# Patient Record
Sex: Female | Born: 1935 | Race: White | Hispanic: No | Smoking: Never smoker
Health system: Southern US, Community
[De-identification: ages and names within clinical notes are randomized; demographics above are authoritative.]

## PROBLEM LIST (undated history)

## (undated) DIAGNOSIS — I2510 Atherosclerotic heart disease of native coronary artery without angina pectoris: Secondary | ICD-10-CM

## (undated) DIAGNOSIS — Z9889 Other specified postprocedural states: Secondary | ICD-10-CM

## (undated) DIAGNOSIS — F329 Major depressive disorder, single episode, unspecified: Secondary | ICD-10-CM

## (undated) DIAGNOSIS — F419 Anxiety disorder, unspecified: Secondary | ICD-10-CM

## (undated) DIAGNOSIS — J45909 Unspecified asthma, uncomplicated: Secondary | ICD-10-CM

## (undated) DIAGNOSIS — K5790 Diverticulosis of intestine, part unspecified, without perforation or abscess without bleeding: Secondary | ICD-10-CM

## (undated) DIAGNOSIS — I82402 Acute embolism and thrombosis of unspecified deep veins of left lower extremity: Secondary | ICD-10-CM

## (undated) DIAGNOSIS — D126 Benign neoplasm of colon, unspecified: Secondary | ICD-10-CM

## (undated) DIAGNOSIS — M810 Age-related osteoporosis without current pathological fracture: Secondary | ICD-10-CM

## (undated) DIAGNOSIS — R55 Syncope and collapse: Secondary | ICD-10-CM

## (undated) DIAGNOSIS — I10 Essential (primary) hypertension: Secondary | ICD-10-CM

## (undated) DIAGNOSIS — M545 Low back pain, unspecified: Secondary | ICD-10-CM

## (undated) DIAGNOSIS — S72009A Fracture of unspecified part of neck of unspecified femur, initial encounter for closed fracture: Secondary | ICD-10-CM

## (undated) DIAGNOSIS — K219 Gastro-esophageal reflux disease without esophagitis: Secondary | ICD-10-CM

## (undated) DIAGNOSIS — M1990 Unspecified osteoarthritis, unspecified site: Secondary | ICD-10-CM

## (undated) DIAGNOSIS — R635 Abnormal weight gain: Secondary | ICD-10-CM

## (undated) DIAGNOSIS — L853 Xerosis cutis: Secondary | ICD-10-CM

## (undated) DIAGNOSIS — K224 Dyskinesia of esophagus: Secondary | ICD-10-CM

## (undated) DIAGNOSIS — R040 Epistaxis: Secondary | ICD-10-CM

## (undated) DIAGNOSIS — J449 Chronic obstructive pulmonary disease, unspecified: Secondary | ICD-10-CM

## (undated) DIAGNOSIS — E7800 Pure hypercholesterolemia, unspecified: Secondary | ICD-10-CM

## (undated) DIAGNOSIS — H3530 Unspecified macular degeneration: Secondary | ICD-10-CM

## (undated) DIAGNOSIS — F0390 Unspecified dementia without behavioral disturbance: Secondary | ICD-10-CM

## (undated) DIAGNOSIS — I80299 Phlebitis and thrombophlebitis of other deep vessels of unspecified lower extremity: Secondary | ICD-10-CM

## (undated) DIAGNOSIS — K222 Esophageal obstruction: Secondary | ICD-10-CM

## (undated) DIAGNOSIS — F32A Depression, unspecified: Secondary | ICD-10-CM

## (undated) DIAGNOSIS — K220 Achalasia of cardia: Secondary | ICD-10-CM

## (undated) DIAGNOSIS — W19XXXA Unspecified fall, initial encounter: Secondary | ICD-10-CM

## (undated) DIAGNOSIS — Z8679 Personal history of other diseases of the circulatory system: Secondary | ICD-10-CM

## (undated) DIAGNOSIS — R1310 Dysphagia, unspecified: Secondary | ICD-10-CM

## (undated) DIAGNOSIS — D473 Essential (hemorrhagic) thrombocythemia: Secondary | ICD-10-CM

## (undated) DIAGNOSIS — R600 Localized edema: Secondary | ICD-10-CM

## (undated) HISTORY — DX: Major depressive disorder, single episode, unspecified: F32.9

## (undated) HISTORY — PX: ABDOMINAL HYSTERECTOMY: SHX81

## (undated) HISTORY — PX: CHOLECYSTECTOMY: SHX55

## (undated) HISTORY — DX: Unspecified dementia without behavioral disturbance: F03.90

## (undated) HISTORY — DX: Epistaxis: R04.0

## (undated) HISTORY — DX: Personal history of other diseases of the circulatory system: Z86.79

## (undated) HISTORY — DX: Pure hypercholesterolemia, unspecified: E78.00

## (undated) HISTORY — PX: APPENDECTOMY: SHX54

## (undated) HISTORY — DX: Unspecified asthma, uncomplicated: J45.909

## (undated) HISTORY — DX: Syncope and collapse: R55

## (undated) HISTORY — DX: Unspecified macular degeneration: H35.30

## (undated) HISTORY — PX: CATARACT EXTRACTION W/ INTRAOCULAR LENS  IMPLANT, BILATERAL: SHX1307

## (undated) HISTORY — DX: Low back pain, unspecified: M54.50

## (undated) HISTORY — DX: Anxiety disorder, unspecified: F41.9

## (undated) HISTORY — DX: Unspecified osteoarthritis, unspecified site: M19.90

## (undated) HISTORY — DX: Benign neoplasm of colon, unspecified: D12.6

## (undated) HISTORY — DX: Other specified postprocedural states: Z98.890

## (undated) HISTORY — DX: Chronic obstructive pulmonary disease, unspecified: J44.9

## (undated) HISTORY — DX: Localized edema: R60.0

## (undated) HISTORY — DX: Fracture of unspecified part of neck of unspecified femur, initial encounter for closed fracture: S72.009A

## (undated) HISTORY — DX: Achalasia of cardia: K22.0

## (undated) HISTORY — DX: Essential (hemorrhagic) thrombocythemia: D47.3

## (undated) HISTORY — DX: Diverticulosis of intestine, part unspecified, without perforation or abscess without bleeding: K57.90

## (undated) HISTORY — DX: Gastro-esophageal reflux disease without esophagitis: K21.9

## (undated) HISTORY — DX: Abnormal weight gain: R63.5

## (undated) HISTORY — DX: Dysphagia, unspecified: R13.10

## (undated) HISTORY — DX: Xerosis cutis: L85.3

## (undated) HISTORY — DX: Low back pain: M54.5

## (undated) HISTORY — DX: Acute embolism and thrombosis of unspecified deep veins of left lower extremity: I82.402

## (undated) HISTORY — DX: Unspecified fall, initial encounter: W19.XXXA

## (undated) HISTORY — DX: Age-related osteoporosis without current pathological fracture: M81.0

## (undated) HISTORY — DX: Dyskinesia of esophagus: K22.4

## (undated) HISTORY — DX: Depression, unspecified: F32.A

## (undated) HISTORY — DX: Essential (primary) hypertension: I10

## (undated) HISTORY — DX: Atherosclerotic heart disease of native coronary artery without angina pectoris: I25.10

## (undated) HISTORY — DX: Phlebitis and thrombophlebitis of other deep vessels of unspecified lower extremity: I80.299

## (undated) SURGERY — EGD (ESOPHAGOGASTRODUODENOSCOPY)
Anesthesia: Moderate Sedation

---

## 1995-08-21 HISTORY — PX: OTHER SURGICAL HISTORY: SHX169

## 1997-11-16 ENCOUNTER — Inpatient Hospital Stay (HOSPITAL_COMMUNITY): Admission: EM | Admit: 1997-11-16 | Discharge: 1997-11-20 | Payer: Self-pay

## 1999-06-09 ENCOUNTER — Other Ambulatory Visit: Admission: RE | Admit: 1999-06-09 | Discharge: 1999-06-09 | Payer: Self-pay | Admitting: Obstetrics and Gynecology

## 2000-06-26 ENCOUNTER — Ambulatory Visit (HOSPITAL_COMMUNITY): Admission: RE | Admit: 2000-06-26 | Discharge: 2000-06-26 | Payer: Self-pay | Admitting: Internal Medicine

## 2000-06-26 ENCOUNTER — Encounter (INDEPENDENT_AMBULATORY_CARE_PROVIDER_SITE_OTHER): Payer: Self-pay

## 2001-05-09 ENCOUNTER — Encounter: Payer: Self-pay | Admitting: Emergency Medicine

## 2001-05-09 ENCOUNTER — Emergency Department (HOSPITAL_COMMUNITY): Admission: EM | Admit: 2001-05-09 | Discharge: 2001-05-09 | Payer: Self-pay | Admitting: Emergency Medicine

## 2001-09-20 ENCOUNTER — Encounter: Payer: Self-pay | Admitting: Pulmonary Disease

## 2001-09-20 ENCOUNTER — Encounter: Admission: RE | Admit: 2001-09-20 | Discharge: 2001-09-20 | Payer: Self-pay | Admitting: Pulmonary Disease

## 2002-07-23 ENCOUNTER — Encounter: Payer: Self-pay | Admitting: Pulmonary Disease

## 2002-07-23 ENCOUNTER — Ambulatory Visit (HOSPITAL_COMMUNITY): Admission: RE | Admit: 2002-07-23 | Discharge: 2002-07-23 | Payer: Self-pay | Admitting: Pulmonary Disease

## 2003-11-16 ENCOUNTER — Encounter: Admission: RE | Admit: 2003-11-16 | Discharge: 2003-11-16 | Payer: Self-pay | Admitting: Obstetrics and Gynecology

## 2003-12-01 ENCOUNTER — Ambulatory Visit (HOSPITAL_COMMUNITY): Admission: RE | Admit: 2003-12-01 | Discharge: 2003-12-01 | Payer: Self-pay | Admitting: Pulmonary Disease

## 2004-03-03 ENCOUNTER — Ambulatory Visit: Payer: Self-pay | Admitting: Pulmonary Disease

## 2004-03-03 ENCOUNTER — Ambulatory Visit (HOSPITAL_COMMUNITY): Admission: RE | Admit: 2004-03-03 | Discharge: 2004-03-03 | Payer: Self-pay | Admitting: Pulmonary Disease

## 2004-03-08 ENCOUNTER — Ambulatory Visit: Payer: Self-pay | Admitting: Pulmonary Disease

## 2004-05-27 ENCOUNTER — Ambulatory Visit: Payer: Self-pay | Admitting: Pulmonary Disease

## 2004-09-08 ENCOUNTER — Ambulatory Visit: Payer: Self-pay | Admitting: Pulmonary Disease

## 2004-09-22 ENCOUNTER — Ambulatory Visit: Payer: Self-pay | Admitting: Pulmonary Disease

## 2004-09-23 ENCOUNTER — Ambulatory Visit (HOSPITAL_COMMUNITY): Admission: RE | Admit: 2004-09-23 | Discharge: 2004-09-23 | Payer: Self-pay | Admitting: Pulmonary Disease

## 2004-12-01 ENCOUNTER — Emergency Department (HOSPITAL_COMMUNITY): Admission: EM | Admit: 2004-12-01 | Discharge: 2004-12-01 | Payer: Self-pay | Admitting: Emergency Medicine

## 2005-03-13 ENCOUNTER — Ambulatory Visit: Payer: Self-pay | Admitting: Pulmonary Disease

## 2005-03-14 ENCOUNTER — Ambulatory Visit: Payer: Self-pay | Admitting: Pulmonary Disease

## 2005-09-11 ENCOUNTER — Ambulatory Visit: Payer: Self-pay | Admitting: Pulmonary Disease

## 2005-11-07 ENCOUNTER — Ambulatory Visit: Payer: Self-pay | Admitting: Pulmonary Disease

## 2005-11-09 ENCOUNTER — Ambulatory Visit: Payer: Self-pay | Admitting: Cardiology

## 2005-11-14 ENCOUNTER — Inpatient Hospital Stay (HOSPITAL_BASED_OUTPATIENT_CLINIC_OR_DEPARTMENT_OTHER): Admission: RE | Admit: 2005-11-14 | Discharge: 2005-11-14 | Payer: Self-pay | Admitting: Cardiovascular Disease

## 2005-11-14 ENCOUNTER — Ambulatory Visit: Payer: Self-pay | Admitting: Cardiovascular Disease

## 2005-11-17 ENCOUNTER — Ambulatory Visit: Payer: Self-pay

## 2005-11-22 ENCOUNTER — Ambulatory Visit: Payer: Self-pay | Admitting: Cardiology

## 2006-02-13 ENCOUNTER — Ambulatory Visit: Payer: Self-pay | Admitting: Cardiology

## 2006-03-14 ENCOUNTER — Ambulatory Visit: Payer: Self-pay | Admitting: Pulmonary Disease

## 2006-03-22 ENCOUNTER — Ambulatory Visit: Payer: Self-pay | Admitting: Pulmonary Disease

## 2006-03-22 LAB — CONVERTED CEMR LAB
ALT: 20 units/L (ref 0–40)
AST: 25 units/L (ref 0–37)
Albumin: 3.8 g/dL (ref 3.5–5.2)
Alkaline Phosphatase: 86 units/L (ref 39–117)
BUN: 23 mg/dL (ref 6–23)
Basophils Absolute: 0.1 10*3/uL (ref 0.0–0.1)
Basophils Relative: 1 % (ref 0.0–1.0)
CO2: 32 meq/L (ref 19–32)
Calcium: 9.5 mg/dL (ref 8.4–10.5)
Chloride: 99 meq/L (ref 96–112)
Cholesterol: 198 mg/dL (ref 0–200)
Creatinine, Ser: 1.1 mg/dL (ref 0.4–1.2)
Eosinophils Absolute: 0.3 10*3/uL (ref 0.0–0.6)
Eosinophils Relative: 3.8 % (ref 0.0–5.0)
GFR calc Af Amer: 61 mL/min
GFR calc non Af Amer: 50 mL/min
Glucose, Bld: 108 mg/dL — ABNORMAL HIGH (ref 70–99)
HCT: 40.4 % (ref 36.0–46.0)
HDL: 59.1 mg/dL (ref >39.0)
Hemoglobin: 13.9 g/dL (ref 12.0–15.0)
LDL Cholesterol: 113 mg/dL — ABNORMAL HIGH (ref 0–99)
Lymphocytes Relative: 21 % (ref 12.0–46.0)
MCHC: 34.4 g/dL (ref 30.0–36.0)
MCV: 85 fL (ref 78.0–100.0)
Monocytes Absolute: 0.5 10*3/uL (ref 0.2–0.7)
Monocytes Relative: 5.6 % (ref 3.0–11.0)
Neutro Abs: 6.1 10*3/uL (ref 1.4–7.7)
Neutrophils Relative %: 68.6 % (ref 43.0–77.0)
Platelets: 643 10*3/uL — ABNORMAL HIGH (ref 150–400)
Potassium: 4 meq/L (ref 3.5–5.1)
RBC: 4.76 M/uL (ref 3.87–5.11)
RDW: 12.9 % (ref 11.5–14.6)
Sodium: 138 meq/L (ref 135–145)
TSH: 3 microintl units/mL (ref 0.35–5.50)
Total Bilirubin: 0.8 mg/dL (ref 0.3–1.2)
Total CHOL/HDL Ratio: 3.4
Total Protein: 6.9 g/dL (ref 6.0–8.3)
Triglycerides: 129 mg/dL (ref 0–149)
VLDL: 26 mg/dL (ref 0–40)
WBC: 8.9 10*3/uL (ref 4.5–10.5)

## 2006-05-02 ENCOUNTER — Ambulatory Visit: Payer: Self-pay | Admitting: Internal Medicine

## 2006-05-31 ENCOUNTER — Encounter (INDEPENDENT_AMBULATORY_CARE_PROVIDER_SITE_OTHER): Payer: Self-pay | Admitting: *Deleted

## 2006-05-31 ENCOUNTER — Ambulatory Visit: Payer: Self-pay | Admitting: Internal Medicine

## 2006-09-10 ENCOUNTER — Ambulatory Visit: Payer: Self-pay | Admitting: Pulmonary Disease

## 2006-09-10 LAB — CONVERTED CEMR LAB: Vit D, 1,25-Dihydroxy: 32 (ref 20–57)

## 2007-03-12 DIAGNOSIS — M1990 Unspecified osteoarthritis, unspecified site: Secondary | ICD-10-CM

## 2007-03-12 DIAGNOSIS — E7800 Pure hypercholesterolemia, unspecified: Secondary | ICD-10-CM

## 2007-03-12 DIAGNOSIS — M545 Low back pain, unspecified: Secondary | ICD-10-CM | POA: Insufficient documentation

## 2007-03-12 DIAGNOSIS — Z8679 Personal history of other diseases of the circulatory system: Secondary | ICD-10-CM | POA: Insufficient documentation

## 2007-03-12 DIAGNOSIS — K219 Gastro-esophageal reflux disease without esophagitis: Secondary | ICD-10-CM | POA: Insufficient documentation

## 2007-03-12 DIAGNOSIS — M810 Age-related osteoporosis without current pathological fracture: Secondary | ICD-10-CM

## 2007-03-12 DIAGNOSIS — J449 Chronic obstructive pulmonary disease, unspecified: Secondary | ICD-10-CM | POA: Insufficient documentation

## 2007-03-12 DIAGNOSIS — J209 Acute bronchitis, unspecified: Secondary | ICD-10-CM | POA: Insufficient documentation

## 2007-03-12 DIAGNOSIS — D126 Benign neoplasm of colon, unspecified: Secondary | ICD-10-CM

## 2007-03-12 HISTORY — DX: Age-related osteoporosis without current pathological fracture: M81.0

## 2007-03-12 HISTORY — DX: Low back pain, unspecified: M54.50

## 2007-03-12 HISTORY — DX: Unspecified osteoarthritis, unspecified site: M19.90

## 2007-03-12 HISTORY — DX: Benign neoplasm of colon, unspecified: D12.6

## 2007-03-12 HISTORY — DX: Personal history of other diseases of the circulatory system: Z86.79

## 2007-03-12 HISTORY — DX: Pure hypercholesterolemia, unspecified: E78.00

## 2007-03-13 ENCOUNTER — Ambulatory Visit: Payer: Self-pay | Admitting: Pulmonary Disease

## 2007-03-26 ENCOUNTER — Ambulatory Visit: Payer: Self-pay | Admitting: Physical Medicine and Rehabilitation

## 2007-03-26 ENCOUNTER — Encounter
Admission: RE | Admit: 2007-03-26 | Discharge: 2007-06-24 | Payer: Self-pay | Admitting: Physical Medicine and Rehabilitation

## 2007-03-28 ENCOUNTER — Telehealth: Payer: Self-pay | Admitting: Adult Health

## 2007-03-31 DIAGNOSIS — I2510 Atherosclerotic heart disease of native coronary artery without angina pectoris: Secondary | ICD-10-CM | POA: Insufficient documentation

## 2007-03-31 LAB — CONVERTED CEMR LAB
ALT: 18 units/L (ref 0–35)
AST: 22 units/L (ref 0–37)
Albumin: 3.9 g/dL (ref 3.5–5.2)
Alkaline Phosphatase: 69 units/L (ref 39–117)
BUN: 23 mg/dL (ref 6–23)
Basophils Absolute: 0.1 10*3/uL (ref 0.0–0.1)
Basophils Relative: 0.8 % (ref 0.0–1.0)
Bilirubin, Direct: 0.1 mg/dL (ref 0.0–0.3)
CO2: 31 meq/L (ref 19–32)
Calcium: 9.8 mg/dL (ref 8.4–10.5)
Chloride: 102 meq/L (ref 96–112)
Creatinine, Ser: 0.9 mg/dL (ref 0.4–1.2)
Eosinophils Absolute: 0.5 10*3/uL (ref 0.0–0.6)
Eosinophils Relative: 4.9 % (ref 0.0–5.0)
GFR calc Af Amer: 77 mL/min
GFR calc non Af Amer: 63 mL/min
Glucose, Bld: 99 mg/dL (ref 70–99)
HCT: 40 % (ref 36.0–46.0)
Hemoglobin: 14.1 g/dL (ref 12.0–15.0)
Lymphocytes Relative: 22.6 % (ref 12.0–46.0)
MCHC: 35.2 g/dL (ref 30.0–36.0)
MCV: 85 fL (ref 78.0–100.0)
Monocytes Absolute: 0.5 10*3/uL (ref 0.2–0.7)
Monocytes Relative: 4.4 % (ref 3.0–11.0)
Neutro Abs: 7.4 10*3/uL (ref 1.4–7.7)
Neutrophils Relative %: 67.3 % (ref 43.0–77.0)
Platelets: 724 10*3/uL — ABNORMAL HIGH (ref 150–400)
Potassium: 5 meq/L (ref 3.5–5.1)
RBC: 4.71 M/uL (ref 3.87–5.11)
RDW: 14.2 % (ref 11.5–14.6)
Sodium: 140 meq/L (ref 135–145)
TSH: 3.16 microintl units/mL (ref 0.35–5.50)
Total Bilirubin: 0.6 mg/dL (ref 0.3–1.2)
Total Protein: 7 g/dL (ref 6.0–8.3)
WBC: 11 10*3/uL — ABNORMAL HIGH (ref 4.5–10.5)

## 2007-05-06 ENCOUNTER — Encounter
Admission: RE | Admit: 2007-05-06 | Discharge: 2007-06-25 | Payer: Self-pay | Admitting: Physical Medicine and Rehabilitation

## 2007-05-20 ENCOUNTER — Ambulatory Visit: Payer: Self-pay | Admitting: Physical Medicine and Rehabilitation

## 2007-06-13 ENCOUNTER — Ambulatory Visit: Payer: Self-pay | Admitting: Cardiology

## 2007-06-19 ENCOUNTER — Ambulatory Visit: Payer: Self-pay | Admitting: Physical Medicine and Rehabilitation

## 2007-07-15 ENCOUNTER — Encounter
Admission: RE | Admit: 2007-07-15 | Discharge: 2007-10-13 | Payer: Self-pay | Admitting: Physical Medicine and Rehabilitation

## 2007-07-15 ENCOUNTER — Ambulatory Visit: Payer: Self-pay | Admitting: Pulmonary Disease

## 2007-07-15 LAB — CONVERTED CEMR LAB
BUN: 18 mg/dL (ref 6–23)
CO2: 33 meq/L — ABNORMAL HIGH (ref 19–32)
Calcium: 9.3 mg/dL (ref 8.4–10.5)
Chloride: 105 meq/L (ref 96–112)
Cholesterol: 168 mg/dL (ref 0–200)
Creatinine, Ser: 0.9 mg/dL (ref 0.4–1.2)
GFR calc Af Amer: 77 mL/min
GFR calc non Af Amer: 63 mL/min
Glucose, Bld: 110 mg/dL — ABNORMAL HIGH (ref 70–99)
HDL: 48.8 mg/dL (ref >39.0)
LDL Cholesterol: 99 mg/dL (ref 0–99)
Potassium: 4.2 meq/L (ref 3.5–5.1)
Sodium: 142 meq/L (ref 135–145)
Total CHOL/HDL Ratio: 3.4
Triglycerides: 100 mg/dL (ref 0–149)
VLDL: 20 mg/dL (ref 0–40)

## 2007-07-17 ENCOUNTER — Ambulatory Visit: Payer: Self-pay | Admitting: Physical Medicine and Rehabilitation

## 2007-07-29 ENCOUNTER — Ambulatory Visit: Payer: Self-pay | Admitting: Physical Medicine and Rehabilitation

## 2007-09-04 ENCOUNTER — Ambulatory Visit: Payer: Self-pay | Admitting: Physical Medicine and Rehabilitation

## 2007-09-18 ENCOUNTER — Other Ambulatory Visit: Admission: RE | Admit: 2007-09-18 | Discharge: 2007-09-18 | Payer: Self-pay | Admitting: Obstetrics and Gynecology

## 2007-10-11 ENCOUNTER — Encounter
Admission: RE | Admit: 2007-10-11 | Discharge: 2007-10-14 | Payer: Self-pay | Admitting: Physical Medicine and Rehabilitation

## 2007-10-14 ENCOUNTER — Ambulatory Visit: Payer: Self-pay | Admitting: Physical Medicine and Rehabilitation

## 2007-10-29 ENCOUNTER — Ambulatory Visit: Payer: Self-pay | Admitting: Cardiology

## 2007-11-08 ENCOUNTER — Ambulatory Visit: Payer: Self-pay

## 2007-11-08 ENCOUNTER — Encounter: Payer: Self-pay | Admitting: Pulmonary Disease

## 2007-12-11 ENCOUNTER — Ambulatory Visit: Payer: Self-pay | Admitting: Physical Medicine and Rehabilitation

## 2007-12-11 ENCOUNTER — Encounter
Admission: RE | Admit: 2007-12-11 | Discharge: 2007-12-11 | Payer: Self-pay | Admitting: Physical Medicine and Rehabilitation

## 2008-01-07 ENCOUNTER — Ambulatory Visit: Payer: Self-pay | Admitting: Obstetrics and Gynecology

## 2008-01-13 ENCOUNTER — Ambulatory Visit: Payer: Self-pay | Admitting: Pulmonary Disease

## 2008-01-26 DIAGNOSIS — H3530 Unspecified macular degeneration: Secondary | ICD-10-CM | POA: Insufficient documentation

## 2008-01-26 HISTORY — DX: Unspecified macular degeneration: H35.30

## 2008-02-03 ENCOUNTER — Encounter
Admission: RE | Admit: 2008-02-03 | Discharge: 2008-03-11 | Payer: Self-pay | Admitting: Physical Medicine and Rehabilitation

## 2008-02-05 ENCOUNTER — Ambulatory Visit: Payer: Self-pay | Admitting: Physical Medicine and Rehabilitation

## 2008-03-11 ENCOUNTER — Ambulatory Visit: Payer: Self-pay | Admitting: Physical Medicine and Rehabilitation

## 2008-03-19 ENCOUNTER — Encounter: Payer: Self-pay | Admitting: Pulmonary Disease

## 2008-05-04 ENCOUNTER — Ambulatory Visit: Payer: Self-pay | Admitting: Obstetrics and Gynecology

## 2008-05-04 ENCOUNTER — Ambulatory Visit: Payer: Self-pay | Admitting: Physical Medicine and Rehabilitation

## 2008-05-04 ENCOUNTER — Encounter
Admission: RE | Admit: 2008-05-04 | Discharge: 2008-05-04 | Payer: Self-pay | Admitting: Physical Medicine and Rehabilitation

## 2008-06-04 ENCOUNTER — Ambulatory Visit: Payer: Self-pay | Admitting: Pulmonary Disease

## 2008-06-15 ENCOUNTER — Telehealth: Payer: Self-pay | Admitting: Pulmonary Disease

## 2008-06-15 LAB — CONVERTED CEMR LAB
ALT: 19 units/L (ref 0–35)
AST: 25 units/L (ref 0–37)
Albumin: 3.6 g/dL (ref 3.5–5.2)
Alkaline Phosphatase: 62 units/L (ref 39–117)
BUN: 18 mg/dL (ref 6–23)
Basophils Absolute: 0.1 10*3/uL (ref 0.0–0.1)
Basophils Relative: 0.9 % (ref 0.0–3.0)
Bilirubin, Direct: 0.1 mg/dL (ref 0.0–0.3)
CO2: 31 meq/L (ref 19–32)
Calcium: 9.6 mg/dL (ref 8.4–10.5)
Chloride: 105 meq/L (ref 96–112)
Cholesterol: 183 mg/dL (ref 0–200)
Creatinine, Ser: 0.8 mg/dL (ref 0.4–1.2)
Eosinophils Absolute: 0.2 10*3/uL (ref 0.0–0.7)
Eosinophils Relative: 2.2 % (ref 0.0–5.0)
GFR calc non Af Amer: 72.38 mL/min (ref >60)
Glucose, Bld: 110 mg/dL — ABNORMAL HIGH (ref 70–99)
HCT: 44.2 % (ref 36.0–46.0)
HDL: 50.4 mg/dL (ref >39.00)
Hemoglobin: 15 g/dL (ref 12.0–15.0)
LDL Cholesterol: 109 mg/dL — ABNORMAL HIGH (ref 0–99)
Lymphocytes Relative: 19.6 % (ref 12.0–46.0)
Lymphs Abs: 1.8 10*3/uL (ref 0.7–4.0)
MCHC: 33.9 g/dL (ref 30.0–36.0)
MCV: 83.8 fL (ref 78.0–100.0)
Monocytes Absolute: 0.4 10*3/uL (ref 0.1–1.0)
Monocytes Relative: 4.6 % (ref 3.0–12.0)
Neutro Abs: 6.5 10*3/uL (ref 1.4–7.7)
Neutrophils Relative %: 72.7 % (ref 43.0–77.0)
Platelets: 769 10*3/uL — ABNORMAL HIGH (ref 150.0–400.0)
Potassium: 4.6 meq/L (ref 3.5–5.1)
RBC: 5.27 M/uL — ABNORMAL HIGH (ref 3.87–5.11)
RDW: 14.3 % (ref 11.5–14.6)
Sodium: 143 meq/L (ref 135–145)
TSH: 3.02 microintl units/mL (ref 0.35–5.50)
Total Bilirubin: 0.7 mg/dL (ref 0.3–1.2)
Total CHOL/HDL Ratio: 4
Total Protein: 6.9 g/dL (ref 6.0–8.3)
Triglycerides: 117 mg/dL (ref 0.0–149.0)
VLDL: 23.4 mg/dL (ref 0.0–40.0)
Vit D, 25-Hydroxy: 34 ng/mL (ref 30–89)
WBC: 9 10*3/uL (ref 4.5–10.5)

## 2008-07-07 ENCOUNTER — Encounter: Payer: Self-pay | Admitting: Pulmonary Disease

## 2008-07-15 ENCOUNTER — Encounter: Payer: Self-pay | Admitting: Pulmonary Disease

## 2008-08-04 ENCOUNTER — Encounter: Payer: Self-pay | Admitting: Pulmonary Disease

## 2008-08-05 ENCOUNTER — Telehealth: Payer: Self-pay | Admitting: Pulmonary Disease

## 2008-08-06 ENCOUNTER — Ambulatory Visit: Payer: Self-pay | Admitting: Obstetrics and Gynecology

## 2008-08-12 ENCOUNTER — Encounter: Payer: Self-pay | Admitting: Pulmonary Disease

## 2008-08-27 ENCOUNTER — Encounter: Payer: Self-pay | Admitting: Pulmonary Disease

## 2008-09-03 ENCOUNTER — Telehealth: Payer: Self-pay | Admitting: Cardiology

## 2008-09-08 ENCOUNTER — Ambulatory Visit: Payer: Self-pay | Admitting: Cardiology

## 2008-12-01 ENCOUNTER — Ambulatory Visit: Payer: Self-pay | Admitting: Pulmonary Disease

## 2008-12-25 ENCOUNTER — Encounter (INDEPENDENT_AMBULATORY_CARE_PROVIDER_SITE_OTHER): Payer: Self-pay | Admitting: *Deleted

## 2009-03-04 ENCOUNTER — Ambulatory Visit: Payer: Self-pay | Admitting: Cardiology

## 2009-03-22 ENCOUNTER — Encounter: Payer: Self-pay | Admitting: Pulmonary Disease

## 2009-05-09 ENCOUNTER — Emergency Department (HOSPITAL_COMMUNITY): Admission: EM | Admit: 2009-05-09 | Discharge: 2009-05-09 | Payer: Self-pay | Admitting: Emergency Medicine

## 2009-05-09 ENCOUNTER — Telehealth (INDEPENDENT_AMBULATORY_CARE_PROVIDER_SITE_OTHER): Payer: Self-pay | Admitting: *Deleted

## 2009-05-18 ENCOUNTER — Ambulatory Visit: Payer: Self-pay | Admitting: Pulmonary Disease

## 2009-06-04 ENCOUNTER — Ambulatory Visit: Payer: Self-pay | Admitting: Pulmonary Disease

## 2009-06-06 LAB — CONVERTED CEMR LAB
ALT: 15 units/L (ref 0–35)
AST: 20 units/L (ref 0–37)
Albumin: 3.7 g/dL (ref 3.5–5.2)
Alkaline Phosphatase: 59 units/L (ref 39–117)
BUN: 19 mg/dL (ref 6–23)
Basophils Absolute: 0.1 10*3/uL (ref 0.0–0.1)
Basophils Relative: 0.8 % (ref 0.0–3.0)
Bilirubin, Direct: 0.1 mg/dL (ref 0.0–0.3)
CO2: 32 meq/L (ref 19–32)
Calcium: 9.5 mg/dL (ref 8.4–10.5)
Chloride: 102 meq/L (ref 96–112)
Cholesterol: 223 mg/dL — ABNORMAL HIGH (ref 0–200)
Creatinine, Ser: 0.8 mg/dL (ref 0.4–1.2)
Direct LDL: 152.3 mg/dL
Eosinophils Absolute: 0.4 10*3/uL (ref 0.0–0.7)
Eosinophils Relative: 3.2 % (ref 0.0–5.0)
GFR calc non Af Amer: 72.21 mL/min (ref >60)
Glucose, Bld: 88 mg/dL (ref 70–99)
HCT: 49.3 % — ABNORMAL HIGH (ref 36.0–46.0)
HDL: 62.2 mg/dL (ref >39.00)
Hemoglobin: 16.4 g/dL — ABNORMAL HIGH (ref 12.0–15.0)
Lymphocytes Relative: 15.8 % (ref 12.0–46.0)
Lymphs Abs: 2 10*3/uL (ref 0.7–4.0)
MCHC: 33.3 g/dL (ref 30.0–36.0)
MCV: 84.3 fL (ref 78.0–100.0)
Monocytes Absolute: 0.4 10*3/uL (ref 0.1–1.0)
Monocytes Relative: 3.3 % (ref 3.0–12.0)
Neutro Abs: 9.7 10*3/uL — ABNORMAL HIGH (ref 1.4–7.7)
Neutrophils Relative %: 76.9 % (ref 43.0–77.0)
Platelets: 994 10*3/uL — ABNORMAL HIGH (ref 150.0–400.0)
Potassium: 4.5 meq/L (ref 3.5–5.1)
RBC: 5.85 M/uL — ABNORMAL HIGH (ref 3.87–5.11)
RDW: 15.7 % — ABNORMAL HIGH (ref 11.5–14.6)
Sodium: 141 meq/L (ref 135–145)
TSH: 4.07 microintl units/mL (ref 0.35–5.50)
Total Bilirubin: 0.5 mg/dL (ref 0.3–1.2)
Total CHOL/HDL Ratio: 4
Total Protein: 6.8 g/dL (ref 6.0–8.3)
Triglycerides: 87 mg/dL (ref 0.0–149.0)
VLDL: 17.4 mg/dL (ref 0.0–40.0)
Vit D, 25-Hydroxy: 46 ng/mL (ref 30–89)
WBC: 12.6 10*3/uL — ABNORMAL HIGH (ref 4.5–10.5)

## 2009-06-08 ENCOUNTER — Ambulatory Visit: Payer: Self-pay | Admitting: Oncology

## 2009-06-09 DIAGNOSIS — D473 Essential (hemorrhagic) thrombocythemia: Secondary | ICD-10-CM

## 2009-06-09 HISTORY — DX: Essential (hemorrhagic) thrombocythemia: D47.3

## 2009-06-16 ENCOUNTER — Encounter: Payer: Self-pay | Admitting: Pulmonary Disease

## 2009-06-16 LAB — FERRITIN: Ferritin: 12 ng/mL (ref 10–291)

## 2009-06-16 LAB — CBC & DIFF AND RETIC
BASO%: 0.3 % (ref 0.0–2.0)
EOS%: 2.2 % (ref 0.0–7.0)
LYMPH%: 12.9 % — ABNORMAL LOW (ref 14.0–49.7)
MCH: 27.8 pg (ref 25.1–34.0)
MCHC: 32.5 g/dL (ref 31.5–36.0)
MONO#: 0.8 10*3/uL (ref 0.1–0.9)
Platelets: 724 10*3/uL — ABNORMAL HIGH (ref 145–400)
RBC: 5.8 10*6/uL — ABNORMAL HIGH (ref 3.70–5.45)
Retic %: 1.6 % — ABNORMAL HIGH (ref 0.50–1.50)
WBC: 14.8 10*3/uL — ABNORMAL HIGH (ref 3.9–10.3)
lymph#: 1.9 10*3/uL (ref 0.9–3.3)

## 2009-06-16 LAB — IRON AND TIBC: Iron: 53 ug/dL (ref 42–145)

## 2009-06-16 LAB — MORPHOLOGY: PLT EST: INCREASED

## 2009-06-17 ENCOUNTER — Encounter: Payer: Self-pay | Admitting: Pulmonary Disease

## 2009-06-21 LAB — JAK-2 V617F

## 2009-07-06 ENCOUNTER — Encounter: Payer: Self-pay | Admitting: Pulmonary Disease

## 2009-07-06 LAB — CBC WITH DIFFERENTIAL/PLATELET
BASO%: 0.5 % (ref 0.0–2.0)
EOS%: 4 % (ref 0.0–7.0)
HCT: 43.5 % (ref 34.8–46.6)
LYMPH%: 15.1 % (ref 14.0–49.7)
MCH: 29.3 pg (ref 25.1–34.0)
MCHC: 34.3 g/dL (ref 31.5–36.0)
MCV: 85.3 fL (ref 79.5–101.0)
NEUT%: 75.1 % (ref 38.4–76.8)
Platelets: 648 10*3/uL — ABNORMAL HIGH (ref 145–400)

## 2009-07-06 LAB — COMPREHENSIVE METABOLIC PANEL
ALT: 16 U/L (ref 0–35)
AST: 20 U/L (ref 0–37)
CO2: 27 mEq/L (ref 19–32)
Creatinine, Ser: 0.86 mg/dL (ref 0.40–1.20)
Total Bilirubin: 0.7 mg/dL (ref 0.3–1.2)

## 2009-07-06 LAB — URIC ACID: Uric Acid, Serum: 4.3 mg/dL (ref 2.4–7.0)

## 2009-07-19 ENCOUNTER — Ambulatory Visit: Payer: Self-pay | Admitting: Pulmonary Disease

## 2009-07-19 ENCOUNTER — Ambulatory Visit: Payer: Self-pay | Admitting: Cardiology

## 2009-07-19 ENCOUNTER — Inpatient Hospital Stay (HOSPITAL_COMMUNITY): Admission: EM | Admit: 2009-07-19 | Discharge: 2009-07-21 | Payer: Self-pay | Admitting: Emergency Medicine

## 2009-07-20 ENCOUNTER — Encounter: Payer: Self-pay | Admitting: Cardiology

## 2009-07-21 ENCOUNTER — Encounter: Payer: Self-pay | Admitting: Internal Medicine

## 2009-07-23 ENCOUNTER — Ambulatory Visit: Payer: Self-pay | Admitting: Oncology

## 2009-07-27 ENCOUNTER — Encounter: Payer: Self-pay | Admitting: Cardiology

## 2009-07-27 ENCOUNTER — Encounter: Payer: Self-pay | Admitting: Pulmonary Disease

## 2009-07-27 LAB — CBC WITH DIFFERENTIAL/PLATELET
Basophils Absolute: 0 10*3/uL (ref 0.0–0.1)
Eosinophils Absolute: 0.4 10*3/uL (ref 0.0–0.5)
HGB: 13.7 g/dL (ref 11.6–15.9)
MCV: 89 fL (ref 79.5–101.0)
MONO#: 0.5 10*3/uL (ref 0.1–0.9)
MONO%: 6.9 % (ref 0.0–14.0)
NEUT#: 5.2 10*3/uL (ref 1.5–6.5)
RDW: 21.8 % — ABNORMAL HIGH (ref 11.2–14.5)
WBC: 7.3 10*3/uL (ref 3.9–10.3)

## 2009-07-30 ENCOUNTER — Ambulatory Visit: Payer: Self-pay | Admitting: Pulmonary Disease

## 2009-07-30 DIAGNOSIS — R55 Syncope and collapse: Secondary | ICD-10-CM | POA: Insufficient documentation

## 2009-07-30 DIAGNOSIS — D473 Essential (hemorrhagic) thrombocythemia: Secondary | ICD-10-CM | POA: Insufficient documentation

## 2009-07-30 HISTORY — DX: Syncope and collapse: R55

## 2009-08-10 ENCOUNTER — Encounter: Payer: Self-pay | Admitting: Pulmonary Disease

## 2009-08-10 ENCOUNTER — Encounter: Payer: Self-pay | Admitting: Cardiology

## 2009-08-10 LAB — CBC WITH DIFFERENTIAL/PLATELET
Eosinophils Absolute: 0.2 10*3/uL (ref 0.0–0.5)
HGB: 13.1 g/dL (ref 11.6–15.9)
LYMPH%: 24.6 % (ref 14.0–49.7)
MONO#: 0.3 10*3/uL (ref 0.1–0.9)
NEUT#: 4 10*3/uL (ref 1.5–6.5)
Platelets: 380 10*3/uL (ref 145–400)
RBC: 4.07 10*6/uL (ref 3.70–5.45)
WBC: 6 10*3/uL (ref 3.9–10.3)

## 2009-08-24 ENCOUNTER — Ambulatory Visit: Payer: Self-pay | Admitting: Oncology

## 2009-08-24 LAB — CBC WITH DIFFERENTIAL/PLATELET
Eosinophils Absolute: 0.1 10*3/uL (ref 0.0–0.5)
HCT: 37.1 % (ref 34.8–46.6)
LYMPH%: 28.6 % (ref 14.0–49.7)
MCHC: 33.9 g/dL (ref 31.5–36.0)
MONO#: 0.2 10*3/uL (ref 0.1–0.9)
NEUT#: 3.6 10*3/uL (ref 1.5–6.5)
NEUT%: 64.8 % (ref 38.4–76.8)
Platelets: 264 10*3/uL (ref 145–400)
WBC: 5.6 10*3/uL (ref 3.9–10.3)

## 2009-09-06 ENCOUNTER — Encounter: Payer: Self-pay | Admitting: Cardiology

## 2009-09-06 LAB — CBC WITH DIFFERENTIAL/PLATELET
BASO%: 0.8 % (ref 0.0–2.0)
EOS%: 1.4 % (ref 0.0–7.0)
MCH: 35.9 pg — ABNORMAL HIGH (ref 25.1–34.0)
MCHC: 35.3 g/dL (ref 31.5–36.0)
MCV: 101.7 fL — ABNORMAL HIGH (ref 79.5–101.0)
MONO%: 7.2 % (ref 0.0–14.0)
RBC: 3.13 10*6/uL — ABNORMAL LOW (ref 3.70–5.45)
RDW: 28.9 % — ABNORMAL HIGH (ref 11.2–14.5)
lymph#: 1.6 10*3/uL (ref 0.9–3.3)

## 2009-09-22 ENCOUNTER — Encounter: Payer: Self-pay | Admitting: Pulmonary Disease

## 2009-09-22 LAB — CBC WITH DIFFERENTIAL/PLATELET
Eosinophils Absolute: 0 10*3/uL (ref 0.0–0.5)
MCV: 109.1 fL — ABNORMAL HIGH (ref 79.5–101.0)
MONO#: 0.2 10*3/uL (ref 0.1–0.9)
MONO%: 5.5 % (ref 0.0–14.0)
NEUT#: 2 10*3/uL (ref 1.5–6.5)
RBC: 2.82 10*6/uL — ABNORMAL LOW (ref 3.70–5.45)
RDW: 27.6 % — ABNORMAL HIGH (ref 11.2–14.5)
WBC: 3.3 10*3/uL — ABNORMAL LOW (ref 3.9–10.3)
lymph#: 1.1 10*3/uL (ref 0.9–3.3)

## 2009-09-28 ENCOUNTER — Encounter: Payer: Self-pay | Admitting: Pulmonary Disease

## 2009-10-04 ENCOUNTER — Ambulatory Visit: Payer: Self-pay | Admitting: Oncology

## 2009-10-06 ENCOUNTER — Encounter: Payer: Self-pay | Admitting: Pulmonary Disease

## 2009-10-06 LAB — CBC WITH DIFFERENTIAL/PLATELET
BASO%: 0.5 % (ref 0.0–2.0)
Basophils Absolute: 0 10*3/uL (ref 0.0–0.1)
EOS%: 2.6 % (ref 0.0–7.0)
HCT: 31 % — ABNORMAL LOW (ref 34.8–46.6)
HGB: 11 g/dL — ABNORMAL LOW (ref 11.6–15.9)
LYMPH%: 26.7 % (ref 14.0–49.7)
MCH: 40.8 pg — ABNORMAL HIGH (ref 25.1–34.0)
MCHC: 35.3 g/dL (ref 31.5–36.0)
MCV: 115.5 fL — ABNORMAL HIGH (ref 79.5–101.0)
MONO%: 5.3 % (ref 0.0–14.0)
NEUT%: 64.9 % (ref 38.4–76.8)
Platelets: 277 10*3/uL (ref 145–400)

## 2009-10-12 ENCOUNTER — Encounter: Payer: Self-pay | Admitting: Pulmonary Disease

## 2009-10-20 LAB — CBC WITH DIFFERENTIAL/PLATELET
BASO%: 0.4 % (ref 0.0–2.0)
Basophils Absolute: 0 10*3/uL (ref 0.0–0.1)
EOS%: 1.1 % (ref 0.0–7.0)
MCH: 40.4 pg — ABNORMAL HIGH (ref 25.1–34.0)
MCHC: 33.5 g/dL (ref 31.5–36.0)
MCV: 120.4 fL — ABNORMAL HIGH (ref 79.5–101.0)
MONO%: 6.9 % (ref 0.0–14.0)
RDW: 18.1 % — ABNORMAL HIGH (ref 11.2–14.5)
lymph#: 1.4 10*3/uL (ref 0.9–3.3)

## 2009-10-26 ENCOUNTER — Encounter: Payer: Self-pay | Admitting: Cardiology

## 2009-10-26 ENCOUNTER — Encounter: Payer: Self-pay | Admitting: Pulmonary Disease

## 2009-10-26 LAB — CBC WITH DIFFERENTIAL/PLATELET
Basophils Absolute: 0 10*3/uL (ref 0.0–0.1)
EOS%: 0.9 % (ref 0.0–7.0)
Eosinophils Absolute: 0.1 10*3/uL (ref 0.0–0.5)
HGB: 11.4 g/dL — ABNORMAL LOW (ref 11.6–15.9)
MCH: 41.5 pg — ABNORMAL HIGH (ref 25.1–34.0)
MCV: 122.6 fL — ABNORMAL HIGH (ref 79.5–101.0)
MONO%: 5.1 % (ref 0.0–14.0)
NEUT#: 4.5 10*3/uL (ref 1.5–6.5)
RBC: 2.74 10*6/uL — ABNORMAL LOW (ref 3.70–5.45)
RDW: 18.4 % — ABNORMAL HIGH (ref 11.2–14.5)
lymph#: 1.3 10*3/uL (ref 0.9–3.3)

## 2009-10-26 LAB — COMPREHENSIVE METABOLIC PANEL
ALT: <8 U/L (ref 0–35)
AST: 15 U/L (ref 0–37)
Alkaline Phosphatase: 76 U/L (ref 39–117)
BUN: 18 mg/dL (ref 6–23)
Chloride: 102 mEq/L (ref 96–112)
Creatinine, Ser: 0.89 mg/dL (ref 0.40–1.20)

## 2009-10-26 LAB — MORPHOLOGY: PLT EST: ADEQUATE

## 2009-11-02 ENCOUNTER — Encounter: Payer: Self-pay | Admitting: Pulmonary Disease

## 2009-11-11 ENCOUNTER — Encounter: Payer: Self-pay | Admitting: Pulmonary Disease

## 2009-11-12 ENCOUNTER — Ambulatory Visit: Payer: Self-pay | Admitting: Oncology

## 2009-11-12 ENCOUNTER — Encounter: Payer: Self-pay | Admitting: Pulmonary Disease

## 2009-11-17 LAB — CBC WITH DIFFERENTIAL/PLATELET
BASO%: 0.6 % (ref 0.0–2.0)
Basophils Absolute: 0 10*3/uL (ref 0.0–0.1)
HCT: 33.8 % — ABNORMAL LOW (ref 34.8–46.6)
HGB: 11.6 g/dL (ref 11.6–15.9)
LYMPH%: 19.8 % (ref 14.0–49.7)
MCHC: 34.3 g/dL (ref 31.5–36.0)
MONO#: 0.2 10*3/uL (ref 0.1–0.9)
NEUT%: 74.4 % (ref 38.4–76.8)
Platelets: 298 10*3/uL (ref 145–400)
WBC: 5.2 10*3/uL (ref 3.9–10.3)

## 2009-12-13 ENCOUNTER — Ambulatory Visit: Payer: Self-pay | Admitting: Pulmonary Disease

## 2009-12-14 ENCOUNTER — Encounter: Payer: Self-pay | Admitting: Pulmonary Disease

## 2009-12-17 ENCOUNTER — Encounter: Payer: Self-pay | Admitting: Pulmonary Disease

## 2009-12-17 ENCOUNTER — Encounter: Payer: Self-pay | Admitting: Cardiology

## 2009-12-17 ENCOUNTER — Ambulatory Visit: Payer: Self-pay | Admitting: Oncology

## 2009-12-17 LAB — CBC WITH DIFFERENTIAL/PLATELET
BASO%: 0.8 % (ref 0.0–2.0)
Basophils Absolute: 0 10*3/uL (ref 0.0–0.1)
EOS%: 2.3 % (ref 0.0–7.0)
Eosinophils Absolute: 0.1 10*3/uL (ref 0.0–0.5)
HCT: 33.8 % — ABNORMAL LOW (ref 34.8–46.6)
HGB: 11.8 g/dL (ref 11.6–15.9)
LYMPH%: 33.6 % (ref 14.0–49.7)
MCH: 42.2 pg — ABNORMAL HIGH (ref 25.1–34.0)
MCHC: 34.8 g/dL (ref 31.5–36.0)
MCV: 121.2 fL — ABNORMAL HIGH (ref 79.5–101.0)
MONO#: 0.4 10*3/uL (ref 0.1–0.9)
MONO%: 7.2 % (ref 0.0–14.0)
NEUT#: 3.2 10*3/uL (ref 1.5–6.5)
NEUT%: 56.1 % (ref 38.4–76.8)
Platelets: 288 10*3/uL (ref 145–400)
RBC: 2.79 10*6/uL — ABNORMAL LOW (ref 3.70–5.45)
RDW: 13.5 % (ref 11.2–14.5)
WBC: 5.7 10*3/uL (ref 3.9–10.3)
lymph#: 1.9 10*3/uL (ref 0.9–3.3)

## 2009-12-17 LAB — MORPHOLOGY

## 2009-12-23 ENCOUNTER — Encounter: Payer: Self-pay | Admitting: Pulmonary Disease

## 2009-12-27 ENCOUNTER — Encounter: Payer: Self-pay | Admitting: Pulmonary Disease

## 2010-01-17 ENCOUNTER — Encounter: Payer: Self-pay | Admitting: Pulmonary Disease

## 2010-01-17 ENCOUNTER — Ambulatory Visit: Payer: Self-pay | Admitting: Oncology

## 2010-01-17 LAB — MORPHOLOGY: PLT EST: ADEQUATE

## 2010-01-17 LAB — CBC WITH DIFFERENTIAL/PLATELET
Basophils Absolute: 0 10*3/uL (ref 0.0–0.1)
Eosinophils Absolute: 0.1 10*3/uL (ref 0.0–0.5)
HGB: 11.9 g/dL (ref 11.6–15.9)
MCV: 118.6 fL — ABNORMAL HIGH (ref 79.5–101.0)
MONO#: 0.3 10*3/uL (ref 0.1–0.9)
NEUT#: 2.7 10*3/uL (ref 1.5–6.5)
RDW: 13.7 % (ref 11.2–14.5)
WBC: 4.8 10*3/uL (ref 3.9–10.3)
lymph#: 1.7 10*3/uL (ref 0.9–3.3)

## 2010-02-25 ENCOUNTER — Ambulatory Visit: Payer: Self-pay | Admitting: Oncology

## 2010-03-04 ENCOUNTER — Encounter: Payer: Self-pay | Admitting: Pulmonary Disease

## 2010-03-04 ENCOUNTER — Encounter: Payer: Self-pay | Admitting: Cardiology

## 2010-03-04 LAB — LACTATE DEHYDROGENASE: LDH: 155 U/L (ref 94–250)

## 2010-03-04 LAB — COMPREHENSIVE METABOLIC PANEL
ALT: 12 U/L (ref 0–35)
AST: 18 U/L (ref 0–37)
Albumin: 4.3 g/dL (ref 3.5–5.2)
Alkaline Phosphatase: 75 U/L (ref 39–117)
BUN: 19 mg/dL (ref 6–23)
CO2: 30 mEq/L (ref 19–32)
Calcium: 9.5 mg/dL (ref 8.4–10.5)
Chloride: 101 mEq/L (ref 96–112)
Creatinine, Ser: 0.86 mg/dL (ref 0.40–1.20)
Glucose, Bld: 80 mg/dL (ref 70–99)
Potassium: 4.9 mEq/L (ref 3.5–5.3)
Sodium: 138 mEq/L (ref 135–145)
Total Bilirubin: 0.5 mg/dL (ref 0.3–1.2)
Total Protein: 7 g/dL (ref 6.0–8.3)

## 2010-03-04 LAB — CBC WITH DIFFERENTIAL/PLATELET
BASO%: 0.6 % (ref 0.0–2.0)
Basophils Absolute: 0 10*3/uL (ref 0.0–0.1)
EOS%: 1.6 % (ref 0.0–7.0)
Eosinophils Absolute: 0.1 10*3/uL (ref 0.0–0.5)
HCT: 37 % (ref 34.8–46.6)
HGB: 12.5 g/dL (ref 11.6–15.9)
LYMPH%: 25.9 % (ref 14.0–49.7)
MCH: 39.5 pg — ABNORMAL HIGH (ref 25.1–34.0)
MCHC: 33.7 g/dL (ref 31.5–36.0)
MCV: 117.5 fL — ABNORMAL HIGH (ref 79.5–101.0)
MONO#: 0.2 10*3/uL (ref 0.1–0.9)
MONO%: 3.7 % (ref 0.0–14.0)
NEUT#: 3.4 10*3/uL (ref 1.5–6.5)
NEUT%: 68.2 % (ref 38.4–76.8)
Platelets: 347 10*3/uL (ref 145–400)
RBC: 3.15 10*6/uL — ABNORMAL LOW (ref 3.70–5.45)
RDW: 13.3 % (ref 11.2–14.5)
WBC: 5 10*3/uL (ref 3.9–10.3)
lymph#: 1.3 10*3/uL (ref 0.9–3.3)

## 2010-03-04 LAB — MORPHOLOGY: PLT EST: ADEQUATE

## 2010-03-04 LAB — URIC ACID: Uric Acid, Serum: 4.1 mg/dL (ref 2.4–7.0)

## 2010-03-04 LAB — CHCC SMEAR

## 2010-03-08 ENCOUNTER — Ambulatory Visit
Admission: RE | Admit: 2010-03-08 | Discharge: 2010-03-08 | Payer: Self-pay | Source: Home / Self Care | Attending: Cardiology | Admitting: Cardiology

## 2010-03-08 ENCOUNTER — Encounter: Payer: Self-pay | Admitting: Cardiology

## 2010-03-14 ENCOUNTER — Ambulatory Visit (HOSPITAL_BASED_OUTPATIENT_CLINIC_OR_DEPARTMENT_OTHER): Payer: Medicare Other | Admitting: Oncology

## 2010-03-15 ENCOUNTER — Encounter: Payer: Self-pay | Admitting: Pulmonary Disease

## 2010-03-29 NOTE — Miscellaneous (Signed)
Summary: D/C PT/OT/[facility]  D/C PT/OT/[facility]   Imported By: Lester Elkhorn 01/06/2010 10:54:47  _____________________________________________________________________  External Attachment:    Type:   Image     Comment:   External Document

## 2010-03-29 NOTE — Letter (Signed)
Summary: [facility]  [facility]   Imported By: Sherian Rein 11/11/2009 13:48:21  _____________________________________________________________________  External Attachment:    Type:   Image     Comment:   External Document

## 2010-03-29 NOTE — Assessment & Plan Note (Signed)
Summary: followup from ER visit//[facility]   Primary Care Provider:  [name] MD  CC:  5 month ROV & post ER check....  History of Present Illness: [name] here for a follow up visit... she has mult med problems as noted below...    ~  Spring09:  f/u appts w/ Cardiology- [name] & doing well, no change in meds;  [name]... 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 prev w/u by [name] & pain rx from the clinic without help from TENS, Lidoderm, or her current Neurontin 100mg  tabs- taking 3 tabs Tid... she also notes that Vicodin causes nausea therefore not using this either... she switched to Lyrica w/ some improvement & she states the pain moved into her lower back...  ~  Nov09:  doing reasonably well and exercising at the [facility] w/ their new step machine... she denies CP, palpit, no change in chr DOE...   ~  Apr10:  stable overall- still c/o back discomfort really no change x yrs and followed in the pain clinic by [name]- off Glucosamine now, on Tramadol, Lyrica, Celebrex Prn... she has concerns about their "facility fee" tacked on to each OV (pt was referred there by her son who goes there for chr pain)...  ~  Oct10:  she has switched pain management to [facility]- [name] w/ several shots in her back- min relief... on LYRICA 25mg Bid & this seems to be helping some as well... she is more limited by her back pain than by her asthma/COPD...  she had f/u [name] 7/10- stable, same Rx... she's already had her Flu shot for 2010...   ~  May 18, 2009:  pt went to the ER 05/09/09 w/ transient CP at home- took NTG w/o relief in so she took another, no relief in 5 min so she took a third- then developed weakness, left arm weakness/ incoordination, no speech changes and no leg symptoms... this lasted 10 min & resolved  w/o recurr...  NOTE: pt had stopped her 81mg  ASA 3-4d prior for an anticipated shot from [name]...   ER note indicated that she had epig abd pain; no mention of CP, or hx of nonobstructive CAD; no mention of the ASA stopage or 3 NTG at home... ER eval showed no acute EKG changes; neg cardiac enz; labs essent normal; CTBrain w/ atrophy, sm vessel dis, no acute infarction... NOTE: DrWalls prev note indicates prob w/ NTG & she was asked to stop this altogether... no recurrent CP, abd discomfort, weakness, etc...    Current Problem List:  MACULAR DEGENERATION (ICD-362.50) - sees [name] for follow up- on Vitamin therapy...  ASTHMATIC BRONCHITIS & COPD - she is stable on ADVAIR 250Bid + Singulair 10mg /d & Prn albuterol... no recent exac and denies cough, sputum, hemoptysis, worsening dyspnea,  wheezing, chest pains, snoring, daytime hypersomnolence, etc... she still takes allergy shots every 2weeks at [facility].  ~  10/10:  we discussed trying to wean the Singulaire if able, & wrote new Rx for PROAIR Prn.  HYPERTENSION, & CORONARY ARTERY DISEASE - on ASA 81mg /d... she has known non-obstructive CAD and followed by [name] last seen 7/10 doing well... she was prev on Dyazide but stopped this med, and BP= 110/70 today and she takes METOPROLOL 25mg  if her BP is > 150 (this is how she likes to do it) + Prn NTG...  denies HA, fatigue, visual changes, CP, palipit, dizziness, syncope, dyspnea, edema, etc...  ~  Nuclear Stress Test 9/09 was normal- no infarct, no ischemia, EF= 74%...  HYPERCHOLESTEROLEMIA - on diet + LIPITOR 10mg /d... tolerated well...   ~  FLP 1/08 showed TChol 198, TG 129, HDL 59, LDL 113...  ~  FLP 5/09 showed TChol 168, TG 100, HDL 49, LDL 99...  ~  FLP 4/10 showed TChol 183, TG 117, HDL 50, LDL 109... rec> same med, better diet.  GERD - [name] did an EGD 4/08 showing GERD, esophagitis and Rx w/ NEXIUM 40/d (it helps)...  COLONIC POLYPS - last colonoscopy was 4/08 by [name] showing 3mm polyp= tubular adenoma...   DEGENERATIVE JOINT DISEASE (ICD-715.90) BACK PAIN, LUMBAR - she has  signif Back Pain and is followed in the pain clinic ([name] now)... see above >> on Lyrica 25mg /d + Prn Celebrex and Tramadol...  OSTEOPOROSIS (ICD-733.00) - on BONIVA 150mg /mo + Calcium & Vitamins...  ~  BMD here 5/01 showed TScores -0.1 in Spine, & -1.4 in left FemNeck  ~  BMD here 7/08 showed TScores +0.4 in Spine, & -1.6 in left FemNeck...   ~  labs 4/10 showed Vit D level = 34... rec> take Vit D 1000 u OTC daily.  TRANSIENT ISCHEMIC ATTACKS, HX OF - she has sm vessel disease on prev scans... advised to take 81mg  ECASA daily...  ~  3/11: went to ER w/ episode of CP, s/p 3 NTG, developed left arm weakness, no speech or leg symptoms... she was off ASA for several days in anticipation of shot from [name]... ? NTG related, ?off ASA related, ?need for Plavix... decision made to stay on ASA, limit NTG.  ANXIETY (ICD-300.00) - not currently on medication...   Allergies: 1)  ! Sulfa  Comments:  Nurse/Medical Assistant: The patient's medications and allergies were reviewed with the patient and were updated in the Medication and Allergy Lists.  Past History:  Past Medical History:  MACULAR DEGENERATION (ICD-362.50) COPD (ICD-496) HYPERTENSION (ICD-401.9) CORONARY ARTERY DISEASE (ICD-414.00) HYPERCHOLESTEROLEMIA (ICD-272.0) GERD (ICD-530.81) ASTHMATIC BRONCHITIS, ACUTE (ICD-466.0) COLONIC POLYPS (ICD-211.3) DEGENERATIVE JOINT DISEASE (ICD-715.90) BACK PAIN, LUMBAR (ICD-724.2) OSTEOPOROSIS (ICD-733.00) TRANSIENT ISCHEMIC ATTACKS, HX OF (ICD-V12.50) ANXIETY (ICD-300.00)  Past Surgical History: Appendectomy Cholecystectomy TAH and BSO..08/21/95  Family History: Reviewed history from 09/04/2008 and no changes required. Negative for colon cancer  Social History: Reviewed history from 12/01/2008 and no changes required. Married   Review of Systems      See HPI       The patient complains of vision loss and dyspnea on exertion.  The patient denies anorexia, fever, weight  loss, weight gain, decreased hearing, hoarseness, chest pain, syncope, peripheral edema, prolonged cough, headaches, hemoptysis, abdominal pain, melena, hematochezia, severe indigestion/heartburn, hematuria, incontinence, muscle weakness, suspicious skin lesions, transient blindness, difficulty walking, depression, unusual weight change, abnormal bleeding, enlarged lymph nodes, and angioedema.    Vital Signs:  Patient profile:   77 year old female Height:      61 inches Weight:      110 pounds O2 Sat:      95 % on Room air Temp:     97.8 degrees F oral Pulse rate:   68 / minute BP sitting:   120 / 64  (right arm) Cuff size:   regular  Vitals Entered By: Randell Loop CMA (May 18, 2009 2:58 PM)  O2 Sat at Rest %:  95 O2 Flow:  Room air CC: 5 month ROV & post ER check... Is Patient Diabetic? No Pain Assessment Patient in pain? yes      Comments  no changes in meds    Physical Exam  Additional Exam:  WD, WN, [name] in NAD... GENERAL:  Alert & oriented; pleasant & cooperative... HEENT:  [place]/AT, EOM-wnl, PERRLA, EACs-clear, TMs-wnl, NOSE-clear, THROAT-clear & wnl. NECK:  Supple w/ fairROM; no JVD; normal carotid impulses w/o bruits; no thyromegaly or nodules palpated; no lymphadenopathy. CHEST:  Decr BS bilat & clear to P & A; without wheezes/ rales/ or rhonchi heard... HEART:  Regular Rhythm; without murmurs/ rubs/ or gallops detected...  ABDOMEN:  Soft & nontender; normal bowel sounds; no organomegaly or masses palpated... EXT: without deformities, mild arthritic changes; no varicose veins/ +venous insuffic/ no edema. BACK:  kyphosis without focal tenderness... NEURO:  CN's intact; no focal neuro deficits; gait abn due to LBP... DERM:  No lesions noted; no rash etc...    MISC. Report  Procedure date:  05/18/2009  Findings:      DATA REVIEWED:  ~  prev EMR notes by myself & [name]  ~  ER note 05/09/09 w/ XRays, Labs, EKG...   Impression & Recommendations:  Problem  # 1:  EPISODE OF CHEST PAIN & LEFT ARM WEAKNESS>>> This episode started w/ chest pain- so she took total of 3 NTG apart (w/ eventual relief of pain) but developed weakness & incoordination of left arm, no speech or leg symptoms...  she was also off her ASA for 3-4 days due to an upcoming epid steroid shot from [name]... it is unclear if the symptoms were ischemic (related to being off her ASA?) or related to the 3 NTG... we discussed this in detail & I noted [name]'s prev rec to stop NTG... we decided to continue on the ASA daily (as opposed to adding Plavix at this point), and rec that she limit to just one NTG & call 911...   Problem # 2:  COPD (ICD-496) Stable-  continue same. Her updated medication list for this problem includes:    Advair Diskus 250-50 Mcg/dose Misc (Fluticasone-salmeterol) ..... Use one puff two times a day rinse mouth after each use    Proair Hfa 108 (90 Base) Mcg/act Aers (Albuterol sulfate) .Marland Kitchen... 1-2 inhalations every 4-6 h as needed for wheezing...    Singulair 10 Mg Tabs (Montelukast sodium) .Marland Kitchen... Take 1 tablet by mouth once a day  Problem # 3:  HYPERTENSION (ICD-401.9) Controlled-  continue same. Her updated medication list for this problem includes:    Metoprolol Tartrate 25 Mg Tabs (Metoprolol tartrate) .Marland Kitchen... Take 1 tab by mouth two times a day as directed for bp and heart...  Problem # 4:  CORONARY ARTERY DISEASE (ICD-414.00) Followed by [name] & due for a follow up soon. Her updated medication list for this problem includes:    Aspirin 81 Mg Tabs (Aspirin) .Marland Kitchen... Take one by mouth once daily    Nitrostat 0.4 Mg Subl (Nitroglycerin) .Marland Kitchen... Place 1 tab under tongue for chest pain...    Metoprolol Tartrate 25 Mg Tabs (Metoprolol tartrate) .Marland Kitchen... Take 1 tab by mouth two times a day as directed for bp and heart...  Problem # 5:  HYPERCHOLESTEROLEMIA (ICD-272.0) She will ret for FLP... continue med. Her updated medication list for this problem includes:     Lipitor 10 Mg Tabs (Atorvastatin calcium) .Marland Kitchen... Take 1 tablet by mouth once a day  Problem # 6:  BACK PAIN, LUMBAR (ICD-724.2) She will continue her f/u w/ [name]... Her updated medication list for this problem includes:    Aspirin 81 Mg Tabs (Aspirin) .Marland Kitchen... Take one by  mouth once daily    Celebrex 200 Mg Caps (Celecoxib) .Marland Kitchen... Take 1 capsule by mouth once a day    Ultram 50 Mg Tabs (Tramadol hcl) .Marland Kitchen... As needed for pain  Problem # 7:  OTHER PROBLEMS AS NOTED>>>  Complete Medication List: 1)  Advair Diskus 250-50 Mcg/dose Misc (Fluticasone-salmeterol) .... Use one puff two times a day rinse mouth after each use 2)  Proair Hfa 108 (90 Base) Mcg/act Aers (Albuterol sulfate) .Marland Kitchen.. 1-2 inhalations every 4-6 h as needed for wheezing... 3)  Singulair 10 Mg Tabs (Montelukast sodium) .... Take 1 tablet by mouth once a day 4)  Aspirin 81 Mg Tabs (Aspirin) .... Take one by mouth once daily 5)  Nitrostat 0.4 Mg Subl (Nitroglycerin) .... Place 1 tab under tongue for chest pain.Marland KitchenMarland Kitchen 6)  Metoprolol Tartrate 25 Mg Tabs (Metoprolol tartrate) .... Take 1 tab by mouth two times a day as directed for bp and heart... 7)  Lipitor 10 Mg Tabs (Atorvastatin calcium) .... Take 1 tablet by mouth once a day 8)  Nexium 40 Mg Cpdr (Esomeprazole magnesium) .... Take 1 tablet by mouth once a day 9)  Boniva 150 Mg Tabs (Ibandronate sodium) .... Take one tablet by mouth every month 10)  Womens Multivitamin Plus Tabs (Multiple vitamins-minerals) .... Take 1 tab daily... 11)  Vitamin D3 1000 Unit Caps (Cholecalciferol) .... Take 1 cap by mouth once daily 12)  Celebrex 200 Mg Caps (Celecoxib) .... Take 1 capsule by mouth once a day 13)  Ultram 50 Mg Tabs (Tramadol hcl) .... As needed for pain 14)  Lyrica 25 Mg Caps (Pregabalin) .... Take 1 tablet by mouth once a day  Other Orders: Prescription Created Electronically [phone])  Patient Instructions: 1)  Today we updated your med list- see below.... 2)  Continue your aspirin  daily.Marland KitchenMarland Kitchen 3)  We refilled your Nitroglycerin Rx...  4)  Remember to use the Celebrex sparingly for arthritis pain.Marland KitchenMarland Kitchen 5)  Call for any problems... Prescriptions: NITROSTAT 0.4 MG SUBL (NITROGLYCERIN) place 1 tab under tongue for chest pain...  #25 x prn   Entered and Authorized by:   Michele Mcalpine MD   Signed by:   Michele Mcalpine MD on 05/18/2009   Method used:   Print then Give to Patient   RxID:   [id number]

## 2010-03-29 NOTE — Letter (Signed)
Summary: [facility]  [facility]   Imported By: Sherian Rein 01/08/2010 09:58:11  _____________________________________________________________________  External Attachment:    Type:   Image     Comment:   External Document

## 2010-03-29 NOTE — Assessment & Plan Note (Signed)
Summary: F6M/ANAS   Visit Type:  6 mo f/u Primary Provider:  [name] MD  CC:  says sob w/allergies.Marland Kitchenedema/ankles/legs.Marland Kitchenotherwise no other complaints today.  History of Present Illness: Ms Dowe returns today for evaluation and management of her nonobstructive coronary disease, and and frequent nitroglycerin responsive chest discomfort, hypertension and hyperlipidemia.  She's only used nitroglycerin one time since I last saw her. She was working in [employer]. He went away with one nitroglycerin.  She has a history of hyperlipidemia which is followed by primary care. Blood work was checked in April of this past year. Medicines are stable.  Current Medications (verified): 1)  Advair Diskus 250-50 Mcg/dose  Misc (Fluticasone-Salmeterol) .... Use One Puff Two Times A Day Rinse Mouth After Each Use 2)  Proair Hfa 108 (90 Base) Mcg/act Aers (Albuterol Sulfate) .Marland Kitchen.. 1-2 Inhalations Every 4-6 H As Needed For Wheezing... 3)  Singulair 10 Mg  Tabs (Montelukast Sodium) .... Take 1 Tablet By Mouth Once A Day 4)  Aspirin 81 Mg  Tabs (Aspirin) .... Take One By Mouth Once Daily 5)  Nitroquick 0.4 Mg  Subl (Nitroglycerin) .... As Needed 6)  Metoprolol Tartrate 25 Mg Tabs (Metoprolol Tartrate) .... Take 1 Tab By Mouth Two Times A Day As Directed For Bp and Heart... 7)  Lipitor 10 Mg  Tabs (Atorvastatin Calcium) .... Take 1 Tablet By Mouth Once A Day 8)  Nexium 40 Mg  Cpdr (Esomeprazole Magnesium) .... Take 1 Tablet By Mouth Once A Day 9)  Boniva 150 Mg  Tabs (Ibandronate Sodium) .... Take One Tablet By Mouth Every Month 10)  Womens Multivitamin Plus  Tabs (Multiple Vitamins-Minerals) .... Take 1 Tab Daily... 11)  Vitamin D3 1000 Unit Caps (Cholecalciferol) .... Take 1 Cap By Mouth Once Daily 12)  Celebrex 200 Mg Caps (Celecoxib) .... Take 1 Capsule By Mouth Once A Day 13)  Ultram 50 Mg Tabs (Tramadol Hcl) .... As Needed For Pain 14)  Lyrica 25 Mg Caps (Pregabalin) .... Take 1 Tablet By Mouth Once  A Day  Allergies: 1)  ! Sulfa  Past History:  Past Medical History: Last updated: 12/01/2008 MACULAR DEGENERATION (ICD-362.50) COPD (ICD-496) HYPERTENSION (ICD-401.9) CORONARY ARTERY DISEASE (ICD-414.00) HYPERCHOLESTEROLEMIA (ICD-272.0) GERD (ICD-530.81) ASTHMATIC BRONCHITIS, ACUTE (ICD-466.0) COLONIC POLYPS (ICD-211.3) DEGENERATIVE JOINT DISEASE (ICD-715.90) BACK PAIN, LUMBAR (ICD-724.2) OSTEOPOROSIS (ICD-733.00) TRANSIENT ISCHEMIC ATTACKS, HX OF (ICD-V12.50) ANXIETY (ICD-300.00)  Past Surgical History: Last updated: 12/01/2008 Appendectomy Cholecystectomy TAH and BSO..08/21/95  Family History: Last updated: 09/04/2008 Negative for colon cancer  Social History: Last updated: 12/01/2008 Married   Risk Factors: Smoking Status: never (01/13/2008)  Review of Systems       negative other than history of present illness  Vital Signs:  Patient profile:   77 year old female Height:      61 inches Weight:      114 pounds Pulse rate:   58 / minute Pulse rhythm:   regular BP sitting:   112 / 70  (left arm) Cuff size:   large  Vitals Entered By: Danielle Rankin, CMA (March 04, 2009 11:19 AM)  Physical Exam  General:  Well developed, well nourished, in no acute distress. Head:  normocephalic and atraumatic Eyes:  PERRLA/EOM intact; conjunctiva and lids normal. Neck:  Neck supple, no JVD. No masses, thyromegaly or abnormal cervical nodes. Lungs:  Clear bilaterally to auscultation and percussion. Heart:  Non-displaced PMI, chest non-tender; regular rate and rhythm, S1, S2 without murmurs, rubs or gallops. Carotid upstroke normal, no bruit. Normal abdominal aortic  size, no bruits. Femorals normal pulses, no bruits. Pedals normal pulses. No edema, no varicosities. Msk:  decreased ROM.  decreased ROM.   Pulses:  pulses normal in all 4 extremities Extremities:  No clubbing or cyanosis. Neurologic:  Alert and oriented x 3. Skin:  Intact without lesions or rashes. Psych:   Normal affect.   Impression & Recommendations:  Problem # 1:  CORONARY ARTERY DISEASE (ICD-414.00) Assessment Unchanged  Her updated medication list for this problem includes:    Aspirin 81 Mg Tabs (Aspirin) .Marland Kitchen... Take one by mouth once daily    Nitroquick 0.4 Mg Subl (Nitroglycerin) .Marland Kitchen... As needed    Metoprolol Tartrate 25 Mg Tabs (Metoprolol tartrate) .Marland Kitchen... Take 1 tab by mouth two times a day as directed for bp and heart...  Problem # 2:  HYPERTENSION (ICD-401.9) Assessment: Improved  Her updated medication list for this problem includes:    Aspirin 81 Mg Tabs (Aspirin) .Marland Kitchen... Take one by mouth once daily    Metoprolol Tartrate 25 Mg Tabs (Metoprolol tartrate) .Marland Kitchen... Take 1 tab by mouth two times a day as directed for bp and heart...  Problem # 3:  HYPERCHOLESTEROLEMIA (ICD-272.0) Assessment: Unchanged  Her updated medication list for this problem includes:    Lipitor 10 Mg Tabs (Atorvastatin calcium) .Marland Kitchen... Take 1 tablet by mouth once a day  Patient Instructions: 1)  Your physician recommends that you schedule a follow-up appointment in: 12 MONTHS DUE WITH DR Braylyn Kalter  2)  Your physician recommends that you continue on your current medications as directed. Please refer to the Current Medication list given to you today.

## 2010-03-29 NOTE — Miscellaneous (Signed)
Summary: PT & OT orders/[facility]  PT & OT orders/[facility]   Imported By: Sherian Rein 11/08/2009 08:20:37  _____________________________________________________________________  External Attachment:    Type:   Image     Comment:   External Document

## 2010-03-29 NOTE — Letter (Signed)
Summary: [facility]  [facility]   Imported By: Sherian Rein 11/16/2009 11:09:11  _____________________________________________________________________  External Attachment:    Type:   Image     Comment:   External Document

## 2010-03-29 NOTE — Letter (Signed)
Summary: [facility]  [facility]   Imported By: Sherian Rein 10/21/2009 07:50:38  _____________________________________________________________________  External Attachment:    Type:   Image     Comment:   External Document

## 2010-03-29 NOTE — Miscellaneous (Signed)
Summary: Updated Treatment plans/[facility]  Updated Treatment plans/[facility]   Imported By: Sherian Rein 12/20/2009 16:21:07  _____________________________________________________________________  External Attachment:    Type:   Image     Comment:   External Document

## 2010-03-29 NOTE — Miscellaneous (Signed)
Summary: RE-Certification for P T  RE-Certification for P T   Imported By: Lester [place] 11/18/2009 10:38:53  _____________________________________________________________________  External Attachment:    Type:   Image     Comment:   External Document

## 2010-03-29 NOTE — Progress Notes (Signed)
Summary: [facility]  [facility]   Imported By: Debby Freiberg 08/21/2009 10:12:35  _____________________________________________________________________  External Attachment:    Type:   Image     Comment:   External Document

## 2010-03-29 NOTE — Assessment & Plan Note (Signed)
Summary: 6 month follow up/la   Primary Provider/Referring Provider:  [name] MD  CC:  6 months followup, fell 5-6 weeks ago c/o r hip pain, [facility].  Denies sob, and no coughing.  History of Present Illness: [name] with known history of chronic pain , HTN , and Hyperlipidemia.    ~  Spring09:  f/u appts w/ Cardiology- [name] & doing well, no change in meds;  [name]... 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 prev w/u by [name] & pain rx from the clinic without help from TENS, Lidoderm, or her current Neurontin 100mg  tabs- taking 3 tabs Tid...     ~  Apr10:  stable overall- still c/o back discomfort really no change x yrs .  ~  Oct10:  she has switched pain management to [facility]- [name] w/ several shots in her back- min relief... on LYRICA 25mg Bid & this seems to be helping some as well... she is more limited by her back pain than by her asthma/COPD...  she had f/u [name] 7/10- stable, same Rx... she's already had her Flu shot for 2010...  Jul 19, 2009--Pt presents for an acute office viisit. [name] states she had chest pain X1 episode yesterday morning that was resolved w/  nitro x1, when pt got up to go into the bedroom she "blacked out" and fell backward.  She has a knot on back of head from fall.  She has had no further chest pain but feels very tired. EKG in office today shows changes w/ ST depression in lead 2. She has hx of known non-obstructive CAD and followed by [name] last seen 7/10 , Prev.  Nuclear Stress Test 9/09 was normal- no infarct, no ischemia, EF= 74%. She has had no further chest pain, or dyspnea. Episode yesterday was mid sternal chest pain  w/ no radiation. Had associated dyspnea w/ no n/v. Nitro x 1 relieved pain. She remembers standing up from taking meds walking short distance and then got dizzy and blacked out. She denies any confuison today, headache but sore on back  of head.    ~  July 30, 2009:  Hosp x2d by [facility] for what sounds like another NTG misadventure> transient syncopal spell after 1NTG for ?lower CP/ epig discomfort, then weak & had to crawl to bedroom...  Exam was unrevealing, EKG's w/o acute changes, Enz & labs= neg, 2DEcho w/ mild LVH, EF= 60-65%, no wall motion abn, gr1 DD, ?mass in RA?;  subseq TEE showed norm LV w/ EF= 60-65%, incr thickness of atrial septum c/w severe lipomatous hypertrophy (no mass), mod plaque in Ao... we discussed throwing out her NTG & using Pepcid AC, Mylanta liq, Tramadol for future episodes of discomfort... she has been following up w/ [name] on Hydroxyurea> plat count down to 600K.  December 13, 2009 --She presents for 6 month follow up. She continues to have some balance issues. Had another fall 6 weeks ago w/ hip contusion, undergoing PT [facility]. Otherwise is doing well. She is with her husband today. They so very nice and delightful to see. She remains very alert, and appropriate.  Denies chest pain, dyspnea, orthopnea, hemoptysis, fever, n/v/d, edema, headache, no syncopal episodes. We went over labs from earlier this year, not fasting today.   Current Medications (verified): 1)  Allergy Vaccine .... Every 2 Weeks As Directed By Dr. Corinda Gubler 2)  Advair Diskus 250-50 Mcg/dose  Misc (Fluticasone-Salmeterol) .... Use One Puff Two Times A Day Rinse Mouth After Each Use 3)  Proair Hfa 108 (90 Base) Mcg/act Aers (Albuterol Sulfate) .Marland Kitchen.. 1-2 Inhalations Every 4-6 H As Needed For Wheezing... 4)  Singulair 10 Mg  Tabs (Montelukast Sodium) .... Take 1 Tablet By Mouth Once A Day 5)  Aspirin 81 Mg  Tabs (Aspirin) .... Take One By Mouth Once Daily 6)  Metoprolol Tartrate 25 Mg Tabs (Metoprolol Tartrate) .... Take 1 Tab By Mouth Two Times A Day As Directed For Bp and Heart... 7)  Lipitor 20 Mg Tabs (Atorvastatin Calcium) .... Take 1 Tablet By Mouth Once A Day 8)  Nexium 40 Mg  Cpdr (Esomeprazole Magnesium) .... Take 1  Tablet By Mouth Once A Day 9)  Boniva 150 Mg  Tabs (Ibandronate Sodium) .... Take One Tablet By Mouth Every Month 10)  Womens Multivitamin Plus  Tabs (Multiple Vitamins-Minerals) .... Take 1 Tab Daily... 11)  Vitamin D3 1000 Unit Caps (Cholecalciferol) .... Take 1 Cap By Mouth Once Daily 12)  Celebrex 200 Mg Caps (Celecoxib) .... Take 1 Capsule By Mouth As Needed 13)  Ultram 50 Mg Tabs (Tramadol Hcl) .... As Needed For Pain 14)  Lyrica 25 Mg Caps (Pregabalin) .... Take 1 Capsule By Mouth Two Times A Day 15)  Hydroxyurea 500 Mg Caps (Hydroxyurea) .... Take 1 Capsule By Mouth Once A Day As Directed By Dr. Cyndie Chime  Allergies: 1)  ! 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)  Past Surgical History: Last updated: 07/30/2009 Appendectomy Cholecystectomy TAH and BSO..08/21/95  Social History: Last updated: 07/19/2009 Married  3 children never smoked no alcohol retired: Investment banker, corporate  Review of Systems      See HPI  Vital Signs:  Patient profile:   77 year old female Height:      61 inches Weight:      104.38 pounds BMI:     19.79 O2 Sat:      94 % on Room air Temp:     97.9 degrees F oral Pulse rate:   62 / minute BP sitting:   140 / 60  (right arm) Cuff size:   regular  Vitals Entered By: Kandice Hams CMA (December 13, 2009 4:15 PM)  O2 Flow:  Room air CC: 6 months followup, fell 5-6 weeks ago c/o r hip pain, [facility].  Denies sob, no coughing Comments pharmacy verfied  getting flu  vaccine at [facility] on 12/16/09 per patient   Physical Exam  Additional Exam:  WD, WN, [name] in NAD... GENERAL:  Alert & oriented; pleasant & cooperative... HEENT:  [place]/AT, EOM-wnl, PERRLA, EACs-clear, TMs-wnl, NOSE-clear, THROAT-clear & wnl., occipital hematoma, tender to touch.  NECK:  Supple w/ fairROM; no JVD; normal carotid impulses w/o bruits; no thyromegaly or nodules palpated; no lymphadenopathy. CHEST:  Decr BS bilat & clear to P & A; without wheezes/ rales/ or rhonchi heard... HEART:  Regular Rhythm; without murmurs/ rubs/ or gallops detected...  ABDOMEN:  Soft & nontender; normal bowel sounds; no organomegaly or masses palpated... EXT: without deformities, mild arthritic changes; no varicose veins/ +venous insuffic/ no edema. BACK:  kyphosis without focal tenderness... NEURO:  CN's intact; no focal neuro  deficits; gait abn due to LBP... DERM:  No lesions noted; no rash etc...    Impression & Recommendations:  Problem # 1:  DEGENERATIVE JOINT DISEASE (ICD-715.90)  fall 6 weeks ago, receiving PT  Plan :  Continue with physical therapy for hip.  follow up Dr. Kriste Basque in 6 months  Please contact office for sooner follow up as needed   Her updated medication list for this problem includes:    Aspirin 81 Mg Tabs (Aspirin) .Marland Kitchen... Take one by mouth once daily    Celebrex 200 Mg Caps (Celecoxib) .Marland Kitchen... Take 1 capsule by mouth as needed    Ultram 50 Mg Tabs (Tramadol hcl) .Marland Kitchen... As needed for pain  Orders: Est. Patient Level III (16109)  Problem # 2:  HYPERTENSION (ICD-401.9)  controlled on rx  Her updated medication list for this problem includes:    Metoprolol Tartrate 25 Mg Tabs (Metoprolol tartrate) .Marland Kitchen... Take 1 tab by mouth two times a day as directed for bp and heart...  BP today: 140/60 Prior BP: 110/70 (07/30/2009)  Labs Reviewed: K+: 4.5 (06/04/2009) Creat: : 0.8 (06/04/2009)   Chol: 223 (06/04/2009)   HDL: 62.20 (06/04/2009)   LDL: 109 (06/04/2008)   TG:  87.0 (06/04/2009)  Orders: Est. Patient Level III (60454)  Problem # 3:  GERD (ICD-530.81)  controlled on rx  GERD diet discussed  Her updated medication list for this problem includes:    Nexium 40 Mg Cpdr (Esomeprazole magnesium) .Marland Kitchen... Take 1 tablet by mouth once a day  Orders: Est. Patient Level III (09811)  Medications Added to Medication List This Visit: 1)  Celebrex 200 Mg Caps (Celecoxib) .... Take 1 capsule by mouth as needed  Complete Medication List: 1)  Allergy Vaccine  .... Every 2 weeks as directed by dr. Corinda Gubler 2)  Advair Diskus 250-50 Mcg/dose Misc (Fluticasone-salmeterol) .... Use one puff two times a day rinse mouth after each use 3)  Proair Hfa 108 (90 Base) Mcg/act Aers (Albuterol sulfate) .Marland Kitchen.. 1-2 inhalations every 4-6 h as needed for wheezing... 4)  Singulair 10 Mg Tabs (Montelukast sodium) .... Take 1 tablet by mouth once a day 5)  Aspirin 81 Mg Tabs (Aspirin) .... Take one by mouth once daily 6)  Metoprolol Tartrate 25 Mg Tabs (Metoprolol tartrate) .... Take 1 tab by mouth two times a day as directed for bp and heart... 7)  Lipitor 20 Mg Tabs (Atorvastatin calcium) .... Take 1 tablet by mouth once a day 8)  Nexium 40 Mg Cpdr (Esomeprazole magnesium) .... Take 1 tablet by mouth once a day 9)  Boniva 150 Mg Tabs (Ibandronate sodium) .... Take one tablet by mouth every month 10)  Womens Multivitamin Plus Tabs (Multiple vitamins-minerals) .... Take 1 tab daily... 11)  Vitamin D3 1000 Unit Caps (Cholecalciferol) .... Take 1 cap by mouth once daily 12)  Celebrex 200 Mg Caps (Celecoxib) .... Take 1 capsule by mouth as needed 13)  Ultram 50 Mg Tabs (Tramadol hcl) .... As needed for pain 14)  Lyrica 25 Mg Caps (Pregabalin) .... Take 1 capsule by mouth two times a day 15)  Hydroxyurea 500 Mg Caps (Hydroxyurea) .... Take 1 capsule by mouth once a day as directed by dr. Cyndie Chime  Patient Instructions: 1)  Continue with physical therapy for hip.  2)  follow up Dr.  Kriste Basque in 6 months  3)  Please contact office for sooner follow up as needed  Prescriptions: BONIVA 150 MG  TABS (IBANDRONATE SODIUM) take one tablet by mouth every  month  #1 Tablet x 6   Entered and Authorized by:   Rubye Oaks NP   Signed by:   Rubye Oaks NP on 12/13/2009   Method used:   Electronically to        [facility] [address] (retail)       [address]       [place], [place]  [address]       Ph: [phone]       Fax: [phone]   RxID:   [id number]

## 2010-03-29 NOTE — Letter (Signed)
[facility]   Imported By: Roderic Ovens 10/01/2009 14:11:29  _____________________________________________________________________  External Attachment:    Type:   Image     Comment:   External Document

## 2010-03-29 NOTE — Assessment & Plan Note (Signed)
Summary: Acute NP office visit    Primary Provider/Referring Provider:  [name] MD  CC:  pt states she had chest pain X1 episode yesterday morning that was reactive to nitro x1 and when pt got up to go into the bedroom she "blacked out" and fell backward. .  History of Present Illness: [name] with known history of chronic pain , HTN , and Hyperlipidemia.    ~  Spring09:  f/u appts w/ Cardiology- [name] & doing well, no change in meds;  [name]... 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 prev w/u by [name] & pain rx from the clinic without help from TENS, Lidoderm, or her current Neurontin 100mg  tabs- taking 3 tabs Tid...     ~  Apr10:  stable overall- still c/o back discomfort really no change x yrs .  ~  Oct10:  she has switched pain management to [facility]- [name] w/ several shots in her back- min relief... on LYRICA 25mg Bid & this seems to be helping some as well... she is more limited by her back pain than by her asthma/COPD...  she had f/u [name] 7/10- stable, same Rx... she's already had her Flu shot for 2010...  Jul 19, 2009--Pt presents for an acute office viisit. [name] states she had chest pain X1 episode yesterday morning that was resolved w/  nitro x1, when pt got up to go into the bedroom she "blacked out" and fell backward.  She has a knot on back of head from fall.  She has had no further chest pain but feels very tired. EKG in office today shows changes w/ ST depression in lead 2. She has hx of known non-obstructive CAD and followed by [name] last seen 7/10 , Prev.  Nuclear Stress Test 9/09 was normal- no infarct, no ischemia, EF= 74%. She has had no further chest pain, or dyspnea. Episode yesterday was mid sternal chest pain  w/ no radiation. Had associated dyspnea w/ no n/v. Nitro x 1 relieved pain. She remembers standing up from taking meds walking short distance and then got dizzy and blacked out. She  denies any confuison today, headache but sore on back of head.    Medications Prior to Update: 1)  Advair Diskus 250-50 Mcg/dose  Misc (Fluticasone-Salmeterol) .... Use One Puff Two Times A Day Rinse Mouth After Each Use 2)  Proair Hfa 108 (90 Base) Mcg/act Aers (Albuterol Sulfate) .Marland Kitchen.. 1-2 Inhalations Every 4-6 H As Needed For Wheezing... 3)  Singulair 10 Mg  Tabs (Montelukast Sodium) .... Take 1 Tablet By Mouth Once A Day 4)  Aspirin 81 Mg  Tabs (Aspirin) .... Take One By Mouth Once Daily 5)  Nitrostat 0.4 Mg Subl (Nitroglycerin) .... Place 1 Tab Under Tongue For Chest Pain... 6)  Metoprolol Tartrate 25 Mg Tabs (Metoprolol Tartrate) .... Take 1 Tab By Mouth Two Times A Day As Directed For Bp and Heart... 7)  Lipitor 20 Mg Tabs (Atorvastatin Calcium) .... Take 1 Tablet By Mouth Once A Day 8)  Nexium 40 Mg  Cpdr (Esomeprazole Magnesium) .... Take 1 Tablet By Mouth Once A Day 9)  Boniva 150 Mg  Tabs (Ibandronate Sodium) .... Take One Tablet By Mouth Every Month 10)  Womens Multivitamin Plus  Tabs (Multiple Vitamins-Minerals) .... Take 1 Tab Daily... 11)  Vitamin D3 1000 Unit Caps (Cholecalciferol) .... Take 1 Cap By Mouth Once Daily 12)  Celebrex 200 Mg Caps (Celecoxib) .... Take 1  Capsule By Mouth Once A Day 13)  Ultram 50 Mg Tabs (Tramadol Hcl) .... As Needed For Pain 14)  Lyrica 25 Mg Caps (Pregabalin) .... Take 1 Tablet By Mouth Once A Day  Current Medications (verified): 1)  Advair Diskus 250-50 Mcg/dose  Misc (Fluticasone-Salmeterol) .... Use One Puff Two Times A Day Rinse Mouth After Each Use 2)  Proair Hfa 108 (90 Base) Mcg/act Aers (Albuterol Sulfate) .Marland Kitchen.. 1-2 Inhalations Every 4-6 H As Needed For Wheezing... 3)  Singulair 10 Mg  Tabs (Montelukast Sodium) .... Take 1 Tablet By Mouth Once A Day 4)  Aspirin 81 Mg  Tabs (Aspirin) .... Take One By Mouth Once Daily 5)  Nitrostat 0.4 Mg Subl (Nitroglycerin) .... Place 1 Tab Under Tongue For Chest Pain... 6)  Metoprolol Tartrate 25 Mg Tabs  (Metoprolol Tartrate) .... Take 1 Tab By Mouth Two Times A Day As Directed For Bp and Heart... 7)  Lipitor 20 Mg Tabs (Atorvastatin Calcium) .... Take 1 Tablet By Mouth Once A Day 8)  Nexium 40 Mg  Cpdr (Esomeprazole Magnesium) .... Take 1 Tablet By Mouth Once A Day 9)  Boniva 150 Mg  Tabs (Ibandronate Sodium) .... Take One Tablet By Mouth Every Month 10)  Womens Multivitamin Plus  Tabs (Multiple Vitamins-Minerals) .... Take 1 Tab Daily... 11)  Vitamin D3 1000 Unit Caps (Cholecalciferol) .... Take 1 Cap By Mouth Once Daily 12)  Celebrex 200 Mg Caps (Celecoxib) .... Take 1 Capsule By Mouth Once A Day 13)  Ultram 50 Mg Tabs (Tramadol Hcl) .... As Needed For Pain 14)  Lyrica 25 Mg Caps (Pregabalin) .... Take 1 Capsule By Mouth Two Times A Day 15)  Hydroxyurea 500 Mg Caps (Hydroxyurea) .... Take 1 Capsule By Mouth Once A Day As Directed By Dr. Cyndie Chime 16)  Allergy Vaccine .... Every 2 Weeks As Directed By Dr. Corinda Gubler  Allergies (verified): 1)  ! Sulfa  Past History:  Past Surgical History: Last updated: 05/18/2009 Appendectomy Cholecystectomy TAH and BSO..08/21/95  Family History: Last updated: 07/19/2009 aneurysm - father cancer - son (colo-rectal) stroke - mother  Social History: Last updated: 07/19/2009 Married  3 children never smoked no alcohol retired: Investment banker, corporate  Risk Factors: Smoking Status: never (01/13/2008)  Past Medical History: MACULAR DEGENERATION (ICD-362.50) - sees [name] for follow up- on Vitamin therapy...  ASTHMATIC BRONCHITIS & COPD - she is stable on ADVAIR 250Bid + Singulair 10mg /d & Prn albuterol...   HYPERTENSION, & CORONARY ARTERY DISEASE - on ASA 81mg /d... she has known non-obstructive CAD and followed by [name] last seen 7/10 doing well...   ~  Nuclear Stress Test 9/09 was normal- no infarct, no ischemia, EF= 74%...  HYPERCHOLESTEROLEMIA - on diet + LIPITOR 10mg /d... tolerated well...   ~  FLP 1/08 showed TChol 198, TG 129, HDL 59, LDL  113...  ~  FLP 5/09 showed TChol 168, TG 100, HDL 49, LDL 99...  ~  FLP 4/10 showed TChol 183, TG 117, HDL 50, LDL 109... rec> same med, better diet.  GERD - [name] did an EGD 4/08 showing GERD, esophagitis and Rx w/ NEXIUM 40/d (it helps)...  COLONIC POLYPS - last colonoscopy was 4/08 by [name] showing 3mm polyp= tubular adenoma...   DEGENERATIVE JOINT DISEASE (ICD-715.90) BACK PAIN, LUMBAR - she has signif Back Pain and is followed in the pain clinic ([name] now)... >> on Lyrica 25mg /d + Prn Celebrex and Tramadol...  OSTEOPOROSIS (ICD-733.00) - on BONIVA 150mg /mo + Calcium & Vitamins...  ~  BMD here 5/01 showed TScores -0.1  in Spine, & -1.4 in left FemNeck  ~  BMD here 7/08 showed TScores +0.4 in Spine, & -1.6 in left FemNeck...   ~  labs 4/10 showed Vit D level = 34... rec> take Vit D 1000 u OTC daily.  TRANSIENT ISCHEMIC ATTACKS, HX OF - she has sm vessel disease on prev sca  Family History: aneurysm - father cancer - son (colo-rectal) stroke - mother  Social History: Married  3 children never smoked no alcohol retired: Investment banker, corporate  Review of Systems  The patient denies anorexia, fever, weight loss, weight gain, vision loss, decreased hearing, hoarseness, dyspnea on exertion, peripheral edema, prolonged cough, headaches, hemoptysis, abdominal pain, melena, hematochezia, severe indigestion/heartburn, hematuria, incontinence, muscle weakness, suspicious skin lesions, transient blindness, difficulty walking, depression, unusual weight change, abnormal bleeding, enlarged lymph nodes, and angioedema.    Vital Signs:  Patient profile:   77 year old female Height:      61 inches Weight:      101.25 pounds BMI:     19.20 O2 Sat:      99 % on Room air Temp:     97.9 degrees F oral Pulse rate:   96 / minute BP sitting:   130 / 70  (right arm) Cuff size:   regular  Vitals Entered By: Boone Master CNA/MA (Jul 19, 2009 3:43 PM)  O2 Flow:  Room air CC: pt states she had  chest pain X1 episode yesterday morning that was reactive to nitro x1, when pt got up to go into the bedroom she "blacked out" and fell backward.  Is Patient Diabetic? No Comments Medications reviewed with patient Daytime contact number verified with patient. Boone Master CNA/MA  Jul 19, 2009 3:46 PM    Physical Exam  Additional Exam:  WD, WN, [name] in NAD... GENERAL:  Alert & oriented; pleasant & cooperative... HEENT:  [place]/AT, EOM-wnl, PERRLA, EACs-clear, TMs-wnl, NOSE-clear, THROAT-clear & wnl., occipital hematoma, tender to touch.  NECK:  Supple w/ fairROM; no JVD; normal carotid impulses w/o bruits; no thyromegaly or nodules palpated; no lymphadenopathy. CHEST:  Decr BS bilat & clear to P & A; without wheezes/ rales/ or rhonchi heard... HEART:  Regular Rhythm; without murmurs/ rubs/ or gallops detected...  ABDOMEN:  Soft & nontender; normal bowel sounds; no organomegaly or masses palpated... EXT: without deformities, mild arthritic changes; no varicose veins/ +venous insuffic/ no edema. BACK:  kyphosis without focal tenderness... NEURO:  CN's intact; no focal neuro deficits; gait abn due to LBP... DERM:  No lesions noted; no rash etc...    Impression & Recommendations:  Problem # 1:  CORONARY ARTERY DISEASE (ICD-414.00)  Hx of non-obstrucitve CAD w/ prev stress cardiolite in 09 w/ no ischemia and EF of 74% Episode of chest pain w/ syncope (after nitro).  EKG w/ changes in lead 2 -ST depression Worry she may have had cardiac event. Spoke w/ Bjorn Loser from [facility] cards  pt being sent to ER via EMS for cardiology to evaluate.  pt and family aware.  Dr. Kriste Basque aware w/ review of EKG and case.   Her updated medication list for this problem includes:    Aspirin 81 Mg Tabs (Aspirin) .Marland Kitchen... Take one by mouth once daily    Nitrostat 0.4 Mg Subl (Nitroglycerin) .Marland Kitchen... Place 1 tab under tongue for chest pain...    Metoprolol Tartrate 25 Mg Tabs (Metoprolol tartrate) .Marland Kitchen... Take 1 tab by mouth  two times a day as directed for bp and heart...  Orders: Est. Patient Level  IV (84132)  Medications Added to Medication List This Visit: 1)  Lyrica 25 Mg Caps (Pregabalin) .... Take 1 capsule by mouth two times a day 2)  Hydroxyurea 500 Mg Caps (Hydroxyurea) .... Take 1 capsule by mouth once a day as directed by dr. Cyndie Chime 3)  Allergy Vaccine  .... Every 2 weeks as directed by dr. Corinda Gubler  Complete Medication List: 1)  Advair Diskus 250-50 Mcg/dose Misc (Fluticasone-salmeterol) .... Use one puff two times a day rinse mouth after each use 2)  Proair Hfa 108 (90 Base) Mcg/act Aers (Albuterol sulfate) .Marland Kitchen.. 1-2 inhalations every 4-6 h as needed for wheezing... 3)  Singulair 10 Mg Tabs (Montelukast sodium) .... Take 1 tablet by mouth once a day 4)  Aspirin 81 Mg Tabs (Aspirin) .... Take one by mouth once daily 5)  Nitrostat 0.4 Mg Subl (Nitroglycerin) .... Place 1 tab under tongue for chest pain.Marland KitchenMarland Kitchen 6)  Metoprolol Tartrate 25 Mg Tabs (Metoprolol tartrate) .... Take 1 tab by mouth two times a day as directed for bp and heart... 7)  Lipitor 20 Mg Tabs (Atorvastatin calcium) .... Take 1 tablet by mouth once a day 8)  Nexium 40 Mg Cpdr (Esomeprazole magnesium) .... Take 1 tablet by mouth once a day 9)  Boniva 150 Mg Tabs (Ibandronate sodium) .... Take one tablet by mouth every month 10)  Womens Multivitamin Plus Tabs (Multiple vitamins-minerals) .... Take 1 tab daily... 11)  Vitamin D3 1000 Unit Caps (Cholecalciferol) .... Take 1 cap by mouth once daily 12)  Celebrex 200 Mg Caps (Celecoxib) .... Take 1 capsule by mouth once a day 13)  Ultram 50 Mg Tabs (Tramadol hcl) .... As needed for pain 14)  Lyrica 25 Mg Caps (Pregabalin) .... Take 1 capsule by mouth two times a day 15)  Hydroxyurea 500 Mg Caps (Hydroxyurea) .... Take 1 capsule by mouth once a day as directed by dr. Cyndie Chime 16)  Allergy Vaccine  .... Every 2 weeks as directed by dr. Corinda Gubler  Patient Instructions: 1)  EMS to transport  to hospital.    Cardio[employer] ECG  ID: [id number] Patient: Sherri Crawford, Sherri Crawford DOB: 05-13-1920 Age: 77 Years Old Sex: Female Race: White Physician: Rubye Oaks NP PCP: [name] MD Technician: Boone Master CNA/MA Height: 61 Weight: 101.25 Status: Unconfirmed Past Medical History:   MACULAR DEGENERATION (ICD-362.50) COPD (ICD-496) HYPERTENSION (ICD-401.9) CORONARY ARTERY DISEASE (ICD-414.00) HYPERCHOLESTEROLEMIA (ICD-272.0) GERD (ICD-530.81) ASTHMATIC BRONCHITIS, ACUTE (ICD-466.0) COLONIC POLYPS (ICD-211.3) DEGENERATIVE JOINT DISEASE (ICD-715.90) BACK PAIN, LUMBAR (ICD-724.2) OSTEOPOROSIS (ICD-733.00) TRANSIENT ISCHEMIC ATTACKS, HX OF (ICD-V12.50) ANXIETY (ICD-300.00)   Recorded: 07/19/2009 4:01 PM P/PR: 113 ms / 138 ms - Heart rate (maximum exercise) QRS: 90 QT/QTc/QTd: 342 ms / 408 ms / 45 ms - Heart rate (maximum exercise)  P/QRS/T axis: 61 deg / -10 deg / 94 deg - Heart rate (maximum exercise)  Heartrate: 98 bpm  Interpretation:   sinus rhythm (rapid)   Normal ECG

## 2010-03-29 NOTE — Progress Notes (Signed)
Summary: Pt in ED 3/13  Phone Note From Other Clinic   Caller: Ronie Spies [facility] ED Request: Talk with Provider Summary of Call: Dr Foy Guadalajara called.  Pt in [facility] ED 3/13 with  L arm pain.  All studies neg.  Pt ok to be d/c and not need admission.  THe pt will need an OV prob 3/14 with TP or Dr Kriste Basque Initial call taken by: Storm Frisk MD,  May 09, 2009 4:25 PM  Follow-up for Phone Call        Called and spoke with pt.  She states that she went to [facility] ER yesterday with left srm pain and pain across her chest.  She states that she feels better today and CP has resolved but she c/o pain across her back.  She states that she was told that she would need to see SN today.  Please advise thanks Vernie Murders  May 10, 2009 8:50 AM   pt's son called and is concerned.  Feels pt needs to be seen. [phone] - Aliah Eriksson pt's son and pt also. Follow-up by: Eugene Gavia,  May 10, 2009 1:57 PM  Additional Follow-up for Phone Call Additional follow up Details #1::        per SN----we have no openings---we can add her on 3-22 at 3 pm----and call in tramadol 50mg   #30  1 by mouth four times daily as needed for pain.  thanks Randell Loop CMA  May 10, 2009 3:15 PM     Additional Follow-up for Phone Call Additional follow up Details #2::    Spoke with pt and made aware of the above recs.  She states that she is already seeing someone about her back pain and does not need an rx for tramadol.  Appt sched with SN for 05/18/09 at 3:00 pm. Follow-up by: Vernie Murders,  May 10, 2009 3:30 PM

## 2010-03-29 NOTE — Op Note (Signed)
Summary: [facility]  [facility]   Imported By: Sherian Rein 10/11/2009 12:54:09  _____________________________________________________________________  External Attachment:    Type:   Image     Comment:   External Document

## 2010-03-29 NOTE — Letter (Signed)
[facility]   Imported By: Sherian Rein 08/16/2009 09:22:19  _____________________________________________________________________  External Attachment:    Type:   Image     Comment:   External Document

## 2010-03-29 NOTE — Letter (Signed)
[facility]   Imported By: Sherian Rein 07/02/2009 08:34:35  _____________________________________________________________________  External Attachment:    Type:   Image     Comment:   External Document

## 2010-03-29 NOTE — Assessment & Plan Note (Signed)
Summary: Sherri Crawford here at 2:30/la   Primary Care Provider:  [name] MD  CC:  Post hospital ROV....  History of Present Illness: [name] here for a follow up visit... she has mult med problems as noted below...    ~  Spring09:  f/u appts w/ Cardiology- [name] & doing well, no change in meds;  and [name]... 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 prev w/u by [name] & pain rx from the clinic without help from TENS, Lidoderm, or her current Neurontin 100mg  tabs- taking 3 tabs Tid... she also notes that Vicodin causes nausea therefore not using this either... she switched to Lyrica w/ some improvement & she states the pain moved into her lower back...  ~  Nov09:  doing reasonably well and exercising at the [facility] w/ their new step machine... she denies CP, palpit, no change in chr DOE...   ~  Apr10:  stable overall- still c/o back discomfort really no change x yrs and followed in the pain clinic by [name]- off Glucosamine now, on Tramadol, Lyrica, Celebrex Prn... she has concerns about their "facility fee" tacked on to each OV (pt was referred there by her son who goes there for chr pain)...  ~  Oct10:  she has switched pain management to [facility]- [name] w/ several shots in her back- min relief... on LYRICA 25mg Bid & this seems to be helping some as well... she is more limited by her back pain than by her asthma/COPD...  she had f/u [name] 7/10- stable, same Rx... she's already had her Flu shot for 2010...   ~  May 18, 2009:  pt went to the ER 05/09/09 w/ transient CP at home- took NTG w/o relief in so she took another, no relief in 5 min so she took a third- then developed weakness, left arm weakness/ incoordination, no speech changes and no leg symptoms... this lasted 10 min & resolved  w/o recurr...  NOTE: pt had stopped her 81mg  ASA 3-4d prior for an anticipated shot from [name]...  ER note  indicated that she had epig abd pain; no mention of CP, or hx of nonobstructive CAD; no mention of the ASA stoppage or 3 NTG at home... ER eval showed no acute EKG changes; neg cardiac enz; labs essent normal; CTBrain w/ atrophy, sm vessel dis, no acute infarction... NOTE: DrWalls prev note indicates prob w/ NTG & she was asked to stop this altogether... no recurrent CP, abd discomfort, weakness, etc...    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 treated w/ ASA & HYDROXYUREA 500mg - started w/ one daily.   ~  July 30, 2009:  Hosp x2d by [facility] for what sounds like another NTG misadventure> transient syncopal spell after 1NTG for ?lower CP/ epig discomfort, then weak & had to crawl to bedroom...  Exam was unrevealing, EKG's w/o acute changes, Enz & labs= neg, 2DEcho w/ mild LVH, EF= 60-65%, no wall motion abn, gr1 DD, ?mass in RA?;  subseq TEE showed norm LV w/ EF= 60-65%, incr thickness of atrial septum c/w severe lipomatous hypertrophy (no mass), mod plaque in Ao... we discussed throwing out her NTG & using Pepcid AC, Mylanta liq, Tramadol for future episodes of discomfort... she has been following up w/ [name] on Hydroxyurea> plat count down to 600K.   Current Problem List:  MACULAR  DEGENERATION (ICD-362.50) - sees [name] for follow up- on Vitamin therapy...  ASTHMATIC BRONCHITIS & COPD - she is stable on ADVAIR 250Bid + Singulair 10mg /d & Prn albuterol... no recent exac and denies cough, sputum, hemoptysis, worsening dyspnea,  wheezing, chest pains, snoring, daytime hypersomnolence, etc... she still takes allergy shots every 2weeks at [facility].  ~  10/10:  we discussed trying to wean the Singulaire if able, & wrote new Rx for PROAIR Prn.  HYPERTENSION, & CORONARY ARTERY DISEASE - on ASA 81mg /d... she has known non-obstructive CAD and followed by [name]... she was prev on Dyazide but stopped this  med, and BP= 110/70 today and she takes METOPROLOL 25mg  if her BP is > 150 (this is how she likes to do it) + Prn NTG (this was officially removed 5/11)...  denies HA, fatigue, visual changes, recurrent CP, palipit, dizziness, syncope, dyspnea, edema, etc...  ~  Nuclear Stress Test 9/09 was normal- no infarct, no ischemia, EF= 74%...  ~  3/11 & 5/11 >> 2 episodes of NTG reactions (see above) therefore NTG discontinued...  HYPERCHOLESTEROLEMIA - on diet + LIPITOR 20mg /d now... tolerated well...   ~  FLP 1/08 showed TChol 198, TG 129, HDL 59, LDL 113...  ~  FLP 5/09 showed TChol 168, TG 100, HDL 49, LDL 99...  ~  FLP 4/10 showed TChol 183, TG 117, HDL 50, LDL 109... rec> same med, better diet.  ~  FLP 4/11 showed TChol 223, Tg 87, HDL 62, LDL 152... rec incr Lip20.  GERD - [name] did an EGD 4/08 showing GERD, esophagitis and Rx w/ NEXIUM 40/d (it helps)...  COLONIC POLYPS - last colonoscopy was 4/08 by [name] showing 3mm polyp= tubular adenoma...   DEGENERATIVE JOINT DISEASE (ICD-715.90) BACK PAIN, LUMBAR - she has signif Back Pain and is followed in the pain clinic ([name] now)... see above >> on Lyrica 25mg /d + Prn Celebrex and Tramadol...  OSTEOPOROSIS (ICD-733.00) - on BONIVA 150mg /mo + Calcium & Vitamins...  ~  BMD here 5/01 showed TScores -0.1 in Spine, & -1.4 in left FemNeck  ~  BMD here 7/08 showed TScores +0.4 in Spine, & -1.6 in left FemNeck...   ~  labs 4/10 showed Vit D level = 34... rec> take Vit D 1000 u OTC daily.  TRANSIENT ISCHEMIC ATTACKS, HX OF - she has sm vessel disease on prev scans... on 81mg  ECASA daily...  ~  3/11: went to ER w/ episode of CP, s/p 3 NTG, developed left arm weakness, no speech or leg symptoms... she was off ASA for several days in anticipation of shot from [name]... ? NTG related, ?off ASA related, ?need for Plavix... decision made to stay on ASA, limit NTG.  ANXIETY (ICD-300.00) - not currently on medication...  ESSENTIAL THROMBOCYTHEMIA  (ICD-238.71) - noted to have rising platelet count & referred to [facility] 4/11 w/ Dx of Essential Thrombocytosis/ myeloproliferative disorder & started on HYDROXYUREA 500mg /d w/ titration from DrG...  ~  labs 4/11 showed platlet count = 994K... Hydroxyurea started by Heme.  ~  labs 5/11 showed platelet count = 602K   Preventive Screening-Counseling & Management  Alcohol-Tobacco     Smoking Status: never  Allergies: 1)  ! Sulfa  Comments:  Nurse/Medical Assistant: The patient's medications and allergies were reviewed with the patient and were updated in the Medication and Allergy Lists.  Past History:  Past Medical History:  MACULAR DEGENERATION (ICD-362.50) COPD (ICD-496) HYPERTENSION (ICD-401.9) CORONARY ARTERY DISEASE (ICD-414.00) HYPERCHOLESTEROLEMIA (ICD-272.0) GERD (ICD-530.81) ASTHMATIC BRONCHITIS, ACUTE (ICD-466.0) COLONIC  POLYPS (ICD-211.3) DEGENERATIVE JOINT DISEASE (ICD-715.90) BACK PAIN, LUMBAR (ICD-724.2) OSTEOPOROSIS (ICD-733.00) TRANSIENT ISCHEMIC ATTACKS, HX OF (ICD-V12.50) ANXIETY (ICD-300.00) ESSENTIAL THROMBOCYTHEMIA (ICD-238.71)  Past Surgical History: Appendectomy Cholecystectomy TAH and BSO..08/21/95  Family History: Reviewed history from 07/19/2009 and no changes required. aneurysm - father cancer - son (colo-rectal) stroke - mother  Social History: Reviewed history from 07/19/2009 and no changes required. Married  3 children never smoked no alcohol retired: Investment banker, corporate  Review of Systems      See HPI       The patient complains of decreased hearing and dyspnea on exertion.  The patient denies anorexia, fever, weight loss, weight gain, vision loss, hoarseness, chest pain, syncope, peripheral edema, prolonged cough, headaches, hemoptysis, abdominal pain, melena, hematochezia, severe indigestion/heartburn, hematuria, incontinence, muscle weakness, suspicious skin lesions, transient blindness, difficulty walking, depression, unusual  weight change, abnormal bleeding, enlarged lymph nodes, and angioedema.    Vital Signs:  Patient profile:   77 year old female Height:      61 inches Weight:      105 pounds BMI:     19.91 O2 Sat:      94 % on Room air Temp:     97.6 degrees F oral Pulse rate:   64 / minute BP sitting:   110 / 70  (right arm) Cuff size:   regular  Vitals Entered By: Randell Loop CMA (July 30, 2009 2:07 PM)  O2 Sat at Rest %:  94 O2 Flow:  Room air CC: Post hospital ROV... Is Patient Diabetic? No Pain Assessment Patient in pain? no      Comments no changes in meds today---list updated today   Physical Exam  Additional Exam:  WD, WN, [name] in NAD... GENERAL:  Alert & oriented; pleasant & cooperative... HEENT:  [place]/AT, EOM-wnl, PERRLA, EACs-clear, TMs-wnl, NOSE-clear, THROAT-clear & wnl. NECK:  Supple w/ fairROM; no JVD; normal carotid impulses w/o bruits; no thyromegaly or nodules palpated; no lymphadenopathy. CHEST:  Decr BS bilat & clear to P & A; without wheezes/ rales/ or rhonchi heard... HEART:  Regular Rhythm; without murmurs/ rubs/ or gallops detected...  ABDOMEN:  Soft & nontender; normal bowel sounds; no organomegaly or masses palpated... EXT: without deformities, mild arthritic changes; no varicose veins/ +venous insuffic/ no edema. BACK:  kyphosis without focal tenderness... NEURO:  CN's intact; no focal neuro deficits; gait abn due to LBP... DERM:  No lesions noted; no rash etc...    MISC. Report  Procedure date:  07/30/2009  Findings:      DATA REVIEWED:   ~  Hosp H&P, DCSummary, XRays, Labs, 2DEcho & TEE...  ~  Office Notes from [name] & labs...   Impression & Recommendations:  Problem # 1:  SYNCOPE (ICD-780.2) She had a syncopal spell related to taking NTG for her discomfort>  but the discomfort was really epig pain & not angina, then passed out when rising... hit back of her head w/ hematoma- slowly resolving, CTBrain otherw unchanged (sm vessel  dis)... this is the second NTG misadventure & she is not really having any angina... therefore told to stop the NTG & throw it out... for her epig discomfort Rx w/ PEPCID AC + MYLANTA LIQ... and may use Tramadol Prn as well...  Problem # 2:  HYPERTENSION (ICD-401.9) Controlled>  same med. Her updated medication list for this problem includes:    Metoprolol Tartrate 25 Mg Tabs (Metoprolol tartrate) .Marland Kitchen... Take 1 tab by mouth two times a day as directed for bp and  heart...  Problem # 3:  CORONARY ARTERY DISEASE (ICD-414.00) She has stable non-obstructive CAD & was taking the NTG inappropriastely w/ 2 therapeutic misadventures, therefore NTG discontinued... The following medications were removed from the medication list:    Nitrostat 0.4 Mg Subl (Nitroglycerin) .Marland Kitchen... Place 1 tab under tongue for chest pain... Her updated medication list for this problem includes:    Aspirin 81 Mg Tabs (Aspirin) .Marland Kitchen... Take one by mouth once daily    Metoprolol Tartrate 25 Mg Tabs (Metoprolol tartrate) .Marland Kitchen... Take 1 tab by mouth two times a day as directed for bp and heart...  Problem # 4:  ESSENTIAL THROMBOCYTHEMIA (ICD-238.71) Eval by [name] on Hydroxyurea Rx & improving slowly...  Problem # 5:  GERD (ICD-530.81) We discussed treating the epig discomfort w/ PEPCID, MYLANTA & Tramadol (if nec)... Her updated medication list for this problem includes:    Nexium 40 Mg Cpdr (Esomeprazole magnesium) .Marland Kitchen... Take 1 tablet by mouth once a day  Problem # 6:  OTHER MEDICAL PROBLEMS AS NOTED>>>  Complete Medication List: 1)  Allergy Vaccine  .... Every 2 weeks as directed by dr. Corinda Gubler 2)  Advair Diskus 250-50 Mcg/dose Misc (Fluticasone-salmeterol) .... Use one puff two times a day rinse mouth after each use 3)  Proair Hfa 108 (90 Base) Mcg/act Aers (Albuterol sulfate) .Marland Kitchen.. 1-2 inhalations every 4-6 h as needed for wheezing... 4)  Singulair 10 Mg Tabs (Montelukast sodium) .... Take 1 tablet by mouth once a  day 5)  Aspirin 81 Mg Tabs (Aspirin) .... Take one by mouth once daily 6)  Metoprolol Tartrate 25 Mg Tabs (Metoprolol tartrate) .... Take 1 tab by mouth two times a day as directed for bp and heart... 7)  Lipitor 20 Mg Tabs (Atorvastatin calcium) .... Take 1 tablet by mouth once a day 8)  Nexium 40 Mg Cpdr (Esomeprazole magnesium) .... Take 1 tablet by mouth once a day 9)  Boniva 150 Mg Tabs (Ibandronate sodium) .... Take one tablet by mouth every month 10)  Womens Multivitamin Plus Tabs (Multiple vitamins-minerals) .... Take 1 tab daily... 11)  Vitamin D3 1000 Unit Caps (Cholecalciferol) .... Take 1 cap by mouth once daily 12)  Celebrex 200 Mg Caps (Celecoxib) .... Take 1 capsule by mouth once a day 13)  Ultram 50 Mg Tabs (Tramadol hcl) .... As needed for pain 14)  Lyrica 25 Mg Caps (Pregabalin) .... Take 1 capsule by mouth two times a day 15)  Hydroxyurea 500 Mg Caps (Hydroxyurea) .... Take 1 capsule by mouth once a day as directed by dr. Cyndie Chime  Patient Instructions: 1)  We discussed stopping the nitroglycerin & recommend that you throw this out... 2)  In the event that you have recurrent lower chest & upper abd discomfort>  take PEPCID AC 1-2 tabs by mouth, and some MYLANTA liquid right away... 3)  If the discomfort persists- try the TRAMADOL pain pill... 4)  And if un remitting (or if the pain is very severe) call 911 for ER evaluation.Marland KitchenMarland Kitchen 5)  Let me know if you have any questions.Marland KitchenMarland Kitchen

## 2010-03-31 NOTE — Assessment & Plan Note (Signed)
Summary: yearly ./cy   Visit Type:  1 yr f/u Primary Provider:  [name] MD  CC:  sob...chest pain one night 2-3 months ago.....Sherri Crawford  History of Present Illness: MCrowder returns today for evaluation and management of her coronary artery disease, history of transient ischemic attacks, syncope.  She's had one episode of chest discomfort since I last saw her. It occurred spontaneously. It resolved within a few minutes before she took nitroglycerin.  She denies any symptoms of TIAs which I reviewed with her today.  She is very compliant with her medications.  Current Medications (verified): 1)  Allergy Vaccine .... Every 2 Weeks As Directed By Dr. Corinda Gubler 2)  Advair Diskus 250-50 Mcg/dose  Misc (Fluticasone-Salmeterol) .... Use One Puff Two Times A Day Rinse Mouth After Each Use 3)  Proair Hfa 108 (90 Base) Mcg/act Aers (Albuterol Sulfate) .Sherri Crawford.. 1-2 Inhalations Every 4-6 H As Needed For Wheezing... 4)  Singulair 10 Mg  Tabs (Montelukast Sodium) .... Take 1 Tablet By Mouth Once A Day 5)  Aspirin 81 Mg  Tabs (Aspirin) .... Take One By Mouth Once Daily 6)  Metoprolol Tartrate 25 Mg Tabs (Metoprolol Tartrate) .... Take 1 Tab By Mouth Two Times A Day As Directed For Bp and Heart... 7)  Lipitor 20 Mg Tabs (Atorvastatin Calcium) .... Take 1 Tablet By Mouth Once A Day 8)  Nexium 40 Mg  Cpdr (Esomeprazole Magnesium) .... Take 1 Tablet By Mouth Once A Day 9)  Boniva 150 Mg  Tabs (Ibandronate Sodium) .... Take One Tablet By Mouth Every Month 10)  Vitamin D3 1000 Unit Caps (Cholecalciferol) .... Take 1 Cap By Mouth Once Daily 11)  Celebrex 200 Mg Caps (Celecoxib) .... Take 1 Capsule By Mouth As Needed 12)  Ultram 50 Mg Tabs (Tramadol Hcl) .... As Needed For Pain 13)  Lyrica 25 Mg Caps (Pregabalin) .... Take 1 Capsule By Mouth Two Times A Day 14)  Hydroxyurea 500 Mg Caps (Hydroxyurea) .... Take 1 Capsule By Mouth Once A Day As Directed By Dr. Cyndie Chime  Allergies: 1)  ! Sulfa  Past  History:  Past Medical History: Last updated: 07/30/2009  MACULAR DEGENERATION (ICD-362.50) COPD (ICD-496) HYPERTENSION (ICD-401.9) CORONARY ARTERY DISEASE (ICD-414.00) HYPERCHOLESTEROLEMIA (ICD-272.0) GERD (ICD-530.81) ASTHMATIC BRONCHITIS, ACUTE (ICD-466.0) COLONIC POLYPS (ICD-211.3) DEGENERATIVE JOINT DISEASE (ICD-715.90) BACK PAIN, LUMBAR (ICD-724.2) OSTEOPOROSIS (ICD-733.00) TRANSIENT ISCHEMIC ATTACKS, HX OF (ICD-V12.50) ANXIETY (ICD-300.00) ESSENTIAL THROMBOCYTHEMIA (ICD-238.71)  Past Surgical History: Last updated: 07/30/2009 Appendectomy Cholecystectomy TAH and BSO..08/21/95  Family History: Last updated: 07/19/2009 aneurysm - father cancer - son (colo-rectal) stroke - mother  Social History: Last updated: 07/19/2009 Married  3 children never smoked no alcohol retired: Investment banker, corporate  Risk Factors: Smoking Status: never (07/30/2009)  Review of Systems       negative other than history present illness  Vital Signs:  Patient profile:   77 year old female Height:      61 inches Weight:      103 pounds BMI:     19.53 Pulse rate:   54 / minute Pulse rhythm:   irregular BP sitting:   142 / 78  (left arm) Cuff size:   large  Vitals Entered By: Danielle Rankin, CMA (March 08, 2010 11:02 AM)  Physical Exam  General:  Well developed, well nourished, in no acute distress. Head:  normocephalic and atraumatic Eyes:  PERRLA/EOM intact; conjunctiva and lids normal. Neck:  Neck supple, no JVD. No masses, thyromegaly or abnormal cervical nodes. Chest Danyella Mcginty:  no deformities or breast masses noted Lungs:  Clear bilaterally to auscultation and percussion. Heart:  PMI nondisplaced, regular rate and rhythm, normal S1-S2, no carotid bruits Msk:  decreased ROM.   Pulses:  pulses normal in all 4 extremities Extremities:  No clubbing or cyanosis. Neurologic:  Alert and oriented x 3. Skin:  Intact without lesions or rashes. Psych:  Normal affect.   Impression &  Recommendations:  Problem # 1:  HYPERTENSION (ICD-401.9) Assessment Unchanged  Her updated medication list for this problem includes:    Aspirin 81 Mg Tabs (Aspirin) .Sherri Crawford... Take one by mouth once daily    Metoprolol Tartrate 25 Mg Tabs (Metoprolol tartrate) .Sherri Crawford... Take 1 tab by mouth two times a day as directed for bp and heart...  Problem # 2:  CORONARY ARTERY DISEASE (ICD-414.00) Assessment: Unchanged I have removed her sublingual nitroglycerin and instructions on use. Her updated medication list for this problem includes:    Aspirin 81 Mg Tabs (Aspirin) .Sherri Crawford... Take one by mouth once daily    Metoprolol Tartrate 25 Mg Tabs (Metoprolol tartrate) .Sherri Crawford... Take 1 tab by mouth two times a day as directed for bp and heart...    Nitrostat 0.4 Mg Subl (Nitroglycerin) .Sherri Crawford... 1 tablet under tongue at onset of chest pain; you may repeat every 5 minutes for up to 3 doses.  Orders: EKG w/ Interpretation (93000)  Problem # 3:  TRANSIENT ISCHEMIC ATTACKS, HX OF (ICD-V12.50) Assessment: Improved  Problem # 4:  SYNCOPE (ICD-780.2) Assessment: Improved  Her updated medication list for this problem includes:    Aspirin 81 Mg Tabs (Aspirin) .Sherri Crawford... Take one by mouth once daily    Metoprolol Tartrate 25 Mg Tabs (Metoprolol tartrate) .Sherri Crawford... Take 1 tab by mouth two times a day as directed for bp and heart...    Nitrostat 0.4 Mg Subl (Nitroglycerin) .Sherri Crawford... 1 tablet under tongue at onset of chest pain; you may repeat every 5 minutes for up to 3 doses.  Patient Instructions: 1)  Your physician recommends that you schedule a follow-up appointment in: 1 year with Dr.Noemie Devivo 2)  Your physician recommends that you continue on your current medications as directed. Please refer to the Current Medication list given to you today. Prescriptions: NITROSTAT 0.4 MG SUBL (NITROGLYCERIN) 1 tablet under tongue at onset of chest pain; you may repeat every 5 minutes for up to 3 doses.  #25 x 11   Entered by:   Lisabeth Devoid RN    Authorized by:   Gaylord Shih, MD, [facility]   Signed by:   Lisabeth Devoid RN on 03/08/2010   Method used:   Electronically to        [facility] [address] (retail)       [address]       [place], [place]  [address]       Ph: [phone]       Fax: [phone]   RxID:   [id number]

## 2010-03-31 NOTE — Letter (Signed)
Summary: [facility]  [facility]   Imported By: Sherian Rein 03/22/2010 11:04:33  _____________________________________________________________________  External Attachment:    Type:   Image     Comment:   External Document

## 2010-04-06 NOTE — Letter (Signed)
Summary: [facility]  [facility]   Imported By: Marylou Mccoy 03/25/2010 10:54:54  _____________________________________________________________________  External Attachment:    Type:   Image     Comment:   External Document

## 2010-04-14 NOTE — Op Note (Signed)
Summary: [facility]   Imported By: Sherian Rein 04/05/2010 07:47:29  _____________________________________________________________________  External Attachment:    Type:   Image     Comment:   External Document

## 2010-04-29 ENCOUNTER — Encounter: Payer: Medicare Other | Admitting: Oncology

## 2010-04-29 DIAGNOSIS — D47Z9 Other specified neoplasms of uncertain behavior of lymphoid, hematopoietic and related tissue: Secondary | ICD-10-CM

## 2010-04-29 DIAGNOSIS — D473 Essential (hemorrhagic) thrombocythemia: Secondary | ICD-10-CM

## 2010-04-29 LAB — MORPHOLOGY

## 2010-04-29 LAB — CBC WITH DIFFERENTIAL/PLATELET
BASO%: 0.3 % (ref 0.0–2.0)
EOS%: 0.9 % (ref 0.0–7.0)
MCH: 39.4 pg — ABNORMAL HIGH (ref 25.1–34.0)
MCHC: 33.7 g/dL (ref 31.5–36.0)
MONO#: 0.3 10*3/uL (ref 0.1–0.9)
RBC: 3.21 10*6/uL — ABNORMAL LOW (ref 3.70–5.45)
WBC: 6.2 10*3/uL (ref 3.9–10.3)
lymph#: 1.3 10*3/uL (ref 0.9–3.3)

## 2010-05-16 LAB — DIFFERENTIAL
Basophils Absolute: 0.1 10*3/uL (ref 0.0–0.1)
Eosinophils Relative: 2 % (ref 0–5)
Lymphocytes Relative: 13 % (ref 12–46)
Lymphocytes Relative: 13 % (ref 12–46)
Lymphs Abs: 1 10*3/uL (ref 0.7–4.0)
Lymphs Abs: 1 10*3/uL (ref 0.7–4.0)
Monocytes Absolute: 0.3 10*3/uL (ref 0.1–1.0)
Neutro Abs: 5.8 10*3/uL (ref 1.7–7.7)
Neutro Abs: 6.1 10*3/uL (ref 1.7–7.7)

## 2010-05-16 LAB — CARDIAC PANEL(CRET KIN+CKTOT+MB+TROPI)
CK, MB: 1 ng/mL (ref 0.3–4.0)
CK, MB: 1.2 ng/mL (ref 0.3–4.0)
Relative Index: INVALID (ref 0.0–2.5)
Total CK: 22 U/L (ref 7–177)
Total CK: 32 U/L (ref 7–177)

## 2010-05-16 LAB — LIPID PANEL
Cholesterol: 146 mg/dL (ref 0–200)
LDL Cholesterol: 76 mg/dL (ref 0–99)
Total CHOL/HDL Ratio: 3.2 RATIO

## 2010-05-16 LAB — BASIC METABOLIC PANEL
Calcium: 8.4 mg/dL (ref 8.4–10.5)
GFR calc non Af Amer: >60 mL/min (ref >60)
Glucose, Bld: 111 mg/dL — ABNORMAL HIGH (ref 70–99)
Potassium: 3.8 mEq/L (ref 3.5–5.1)
Sodium: 137 mEq/L (ref 135–145)

## 2010-05-16 LAB — CBC
HCT: 43.7 % (ref 36.0–46.0)
HCT: 44.3 % (ref 36.0–46.0)
Hemoglobin: 13.8 g/dL (ref 12.0–15.0)
Hemoglobin: 14.9 g/dL (ref 12.0–15.0)
Hemoglobin: 15.1 g/dL — ABNORMAL HIGH (ref 12.0–15.0)
Platelets: 429 10*3/uL — ABNORMAL HIGH (ref 150–400)
RBC: 4.96 MIL/uL (ref 3.87–5.11)
RDW: 20.6 % — ABNORMAL HIGH (ref 11.5–15.5)
RDW: 21.1 % — ABNORMAL HIGH (ref 11.5–15.5)
WBC: 7.6 10*3/uL (ref 4.0–10.5)
WBC: 7.8 10*3/uL (ref 4.0–10.5)
WBC: 7.9 10*3/uL (ref 4.0–10.5)

## 2010-05-16 LAB — POCT CARDIAC MARKERS
Myoglobin, poc: 108 ng/mL (ref 12–200)
Troponin i, poc: <0.05 ng/mL (ref 0.00–0.09)

## 2010-05-16 LAB — COMPREHENSIVE METABOLIC PANEL
ALT: 12 U/L (ref 0–35)
Alkaline Phosphatase: 70 U/L (ref 39–117)
BUN: 11 mg/dL (ref 6–23)
CO2: 29 mEq/L (ref 19–32)
Chloride: 99 mEq/L (ref 96–112)
GFR calc non Af Amer: >60 mL/min (ref >60)
Glucose, Bld: 108 mg/dL — ABNORMAL HIGH (ref 70–99)
Potassium: 3.8 mEq/L (ref 3.5–5.1)
Sodium: 138 mEq/L (ref 135–145)
Total Bilirubin: 0.8 mg/dL (ref 0.3–1.2)
Total Protein: 5.9 g/dL — ABNORMAL LOW (ref 6.0–8.3)

## 2010-05-16 LAB — MAGNESIUM: Magnesium: 2 mg/dL (ref 1.5–2.5)

## 2010-05-16 LAB — CK TOTAL AND CKMB (NOT AT ARMC)
Relative Index: INVALID (ref 0.0–2.5)
Total CK: 29 U/L (ref 7–177)

## 2010-05-16 LAB — POCT I-STAT, CHEM 8
BUN: 16 mg/dL (ref 6–23)
Chloride: 103 mEq/L (ref 96–112)
Creatinine, Ser: 1.9 mg/dL — ABNORMAL HIGH (ref 0.4–1.2)
Sodium: 136 mEq/L (ref 135–145)
TCO2: 27 mmol/L (ref 0–100)

## 2010-05-16 LAB — TSH: TSH: 2.209 u[IU]/mL (ref 0.350–4.500)

## 2010-05-19 ENCOUNTER — Telehealth: Payer: Self-pay | Admitting: Pulmonary Disease

## 2010-05-19 NOTE — Telephone Encounter (Signed)
NA. Not able to leave a msg. WCB.Michel Bickers, MA

## 2010-05-19 NOTE — Telephone Encounter (Signed)
Called and spoke with pt. Pt is concerned as she is losing weight.  Pt was last seen in our office by TP on 12/13/2009 and weighed 104.  Pt states today she weighs 95.5.  Pt c/o no appetite and no energy.  Denies n/v/d.  States occ costipation.  Denies f/c/s or abd pain.  Pt states she called Dr. Patsy Lager office regarding the weight loss and was told to call here.  Pt states Dr. Cyndie Chime did blood work on pt on 3/2 and states everything "must have came back normal because the nurse didn't call me about the results."  Will forward message to SN to address.

## 2010-05-20 NOTE — Telephone Encounter (Signed)
Called and spoke with pt and her husband and per SN---he will review the labs from Dr. Cyndie Chime  And review the last ov note and appt has been made for pt for Monday 3-26 at 11:30 and pt is aware---per SN --pt needs to drink 2-3 cans daily of boost, ensure, etc and pt voiced her understanding of this and stated that she just got some of those today

## 2010-05-22 LAB — POCT I-STAT, CHEM 8
BUN: 19 mg/dL (ref 6–23)
Calcium, Ion: 1.18 mmol/L (ref 1.12–1.32)
HCT: 50 % — ABNORMAL HIGH (ref 36.0–46.0)
Hemoglobin: 17 g/dL — ABNORMAL HIGH (ref 12.0–15.0)
TCO2: 32 mmol/L (ref 0–100)

## 2010-05-22 LAB — COMPREHENSIVE METABOLIC PANEL
ALT: 13 U/L (ref 0–35)
AST: 16 U/L (ref 0–37)
Alkaline Phosphatase: 61 U/L (ref 39–117)
CO2: 31 mEq/L (ref 19–32)
Chloride: 100 mEq/L (ref 96–112)
GFR calc Af Amer: >60 mL/min (ref >60)
GFR calc non Af Amer: 55 mL/min — ABNORMAL LOW (ref >60)
Potassium: 4.4 mEq/L (ref 3.5–5.1)
Sodium: 135 mEq/L (ref 135–145)
Total Bilirubin: 0.6 mg/dL (ref 0.3–1.2)

## 2010-05-22 LAB — POCT CARDIAC MARKERS

## 2010-05-22 LAB — CBC
Hemoglobin: 15.5 g/dL — ABNORMAL HIGH (ref 12.0–15.0)
RBC: 5.52 MIL/uL — ABNORMAL HIGH (ref 3.87–5.11)
WBC: 13.6 10*3/uL — ABNORMAL HIGH (ref 4.0–10.5)

## 2010-05-23 ENCOUNTER — Telehealth: Payer: Self-pay | Admitting: Internal Medicine

## 2010-05-23 ENCOUNTER — Other Ambulatory Visit (INDEPENDENT_AMBULATORY_CARE_PROVIDER_SITE_OTHER): Payer: Medicare Other

## 2010-05-23 ENCOUNTER — Encounter: Payer: Self-pay | Admitting: Pulmonary Disease

## 2010-05-23 ENCOUNTER — Ambulatory Visit (INDEPENDENT_AMBULATORY_CARE_PROVIDER_SITE_OTHER)
Admission: RE | Admit: 2010-05-23 | Discharge: 2010-05-23 | Disposition: A | Discharge status: Home / Self Care | Payer: Medicare Other | Source: Ambulatory Visit | Attending: Pulmonary Disease | Admitting: Pulmonary Disease

## 2010-05-23 ENCOUNTER — Ambulatory Visit (INDEPENDENT_AMBULATORY_CARE_PROVIDER_SITE_OTHER): Payer: Medicare Other | Admitting: Pulmonary Disease

## 2010-05-23 DIAGNOSIS — R5381 Other malaise: Secondary | ICD-10-CM

## 2010-05-23 DIAGNOSIS — R634 Abnormal weight loss: Secondary | ICD-10-CM

## 2010-05-23 DIAGNOSIS — R5383 Other fatigue: Secondary | ICD-10-CM

## 2010-05-23 DIAGNOSIS — I10 Essential (primary) hypertension: Secondary | ICD-10-CM

## 2010-05-23 DIAGNOSIS — E7800 Pure hypercholesterolemia, unspecified: Secondary | ICD-10-CM

## 2010-05-23 DIAGNOSIS — D473 Essential (hemorrhagic) thrombocythemia: Secondary | ICD-10-CM

## 2010-05-23 DIAGNOSIS — R531 Weakness: Secondary | ICD-10-CM

## 2010-05-23 DIAGNOSIS — M1990 Unspecified osteoarthritis, unspecified site: Secondary | ICD-10-CM

## 2010-05-23 LAB — BASIC METABOLIC PANEL
BUN: 29 mg/dL — ABNORMAL HIGH (ref 6–23)
Calcium: 9.1 mg/dL (ref 8.4–10.5)
Creatinine, Ser: 0.9 mg/dL (ref 0.4–1.2)
GFR: 66.28 mL/min (ref >60.00)
Glucose, Bld: 110 mg/dL — ABNORMAL HIGH (ref 70–99)

## 2010-05-23 LAB — SEDIMENTATION RATE: Sed Rate: 17 mm/hr (ref 0–22)

## 2010-05-23 LAB — CBC WITH DIFFERENTIAL/PLATELET
Basophils Relative: 0.6 % (ref 0.0–3.0)
Eosinophils Relative: 0.9 % (ref 0.0–5.0)
HCT: 36.6 % (ref 36.0–46.0)
Lymphs Abs: 1.3 10*3/uL (ref 0.7–4.0)
MCV: 118 fl — ABNORMAL HIGH (ref 78.0–100.0)
Monocytes Absolute: 0.3 10*3/uL (ref 0.1–1.0)
Monocytes Relative: 5.6 % (ref 3.0–12.0)
Neutrophils Relative %: 69.8 % (ref 43.0–77.0)
RBC: 3.03 Mil/uL — ABNORMAL LOW (ref 3.87–5.11)
WBC: 5.7 10*3/uL (ref 4.5–10.5)

## 2010-05-23 LAB — HEPATIC FUNCTION PANEL
Albumin: 3.4 g/dL — ABNORMAL LOW (ref 3.5–5.2)
Total Bilirubin: 0.4 mg/dL (ref 0.3–1.2)

## 2010-05-23 LAB — TSH: TSH: 2.3 u[IU]/mL (ref 0.35–5.50)

## 2010-05-23 MED ORDER — MEGESTROL ACETATE 40 MG/ML PO SUSP: 200.0000 mg | Freq: Three times a day (TID) | ORAL | Status: AC

## 2010-05-23 NOTE — Telephone Encounter (Signed)
Libby at Dr. Jodelle Green office given appointment date and time.

## 2010-05-23 NOTE — Progress Notes (Signed)
Subjective:    Patient ID: Sherri Crawford, female    DOB: Aug 02, 1920, 77 y.o.   MRN: [id number]  HPI [name] here for a follow up visit... she has mult med problems including:  Asthmatic Bronchitis;  HBP;  CAD;  Hyperchol;  GERD/ Colon polyps;  DJD/ LBP/ Osteoporosis;  ?hx TIA;  Essential Thrombocytosis on Hydroxyurea per [name]...  ~  July 30, 2009:  Hosp x2d by [facility] for what sounds like another NTG misadventure> transient syncopal spell after 1NTG for ?lower CP/ epig discomfort, then weak & had to crawl to bedroom...  Exam was unrevealing, EKG's w/o acute changes, Enz & labs= neg, 2DEcho w/ mild LVH, EF= 60-65%, no wall motion abn, gr1 DD, ?mass in RA?;  subseq TEE showed norm LV w/ EF= 60-65%, incr thickness of atrial septum c/w severe lipomatous hypertrophy (no mass), mod plaque in Ao... we discussed throwing out her NTG & using Pepcid AC, Mylanta liq, Tramadol for future episodes of discomfort... she has been following up w/ [name] on Hydroxyurea> plat count down to 600K.   ~  May 23, 2010:  9 month ROV & review> she is c/o decr appetite and weight loss assoc w/ ?3wk hx swallowing difficulty that seems intermittent & occurs most often after she has eaten 1/2 of her lunch meal "it doesn't want to go down" & has on occas vomited it back up; she takes Nexium 40mg /d; Weights on office documented 103-105 over the last 6months in 2011, & dropped from 103 to 97# from 1/12 to today (6# wt loss in 64mo);  She was instructed to incr Ensure consumption & we discussed Megace trial of appetite & refer to GI for swallowing difficulty (NOTE: prev EGD 2008 showed GERD & esophagitis, &Colon showed tub adenoma)...    AB>  Stable on Advair, Singulair, Albuterol prn;  No recent exac- doing satis from the pulm standpoint...    HBP/ CAD>  On ASA & only takes 25mg  Metoprolol if BP>150 (this is how she likes to do it);  She had 2 NTG reactions & this was discontinued 5/11;  BP today = 100/50 w/ her wt  loss, no angina etc;  Last saw [name] 1/12> stable, no changes, f/u 76yr...    Chol>  On Lip20 now & last FLP 4/11 on Lip10 reviewed;  She is not fasting today so we can't check FLP, wonder if we even need it now w/ her wt loss, but in view of her signif coronary & Ao calcif & ?prev TIA> rec continue Lip20 for now & f/u FLP when able...    GI>  See above...    DJD/ LBP/ Osteop> followed by [name] on Lyrica, Hydrocodone, Celebrex prn;  Given last epid steroid shot 1/12, mild benefit;  NOTE: today's CXR shows new ant wedging T8 (~40%), last BMD 2008 showed TScore +0.4 in spine (it is not in [employer] EMR)...    Heme>  Followed by [name] w/ Essential Thrombocytosis on Hydroxyurea (last note 1/12 reviewed- well controlled on 500mg /d x 1000mg  on Mondays w/ plats betw 218-365K)...   Review of Systems        See HPI       The patient complains of decreased hearing and dyspnea on exertion.  The patient denies anorexia, fever, weight loss, weight gain, vision loss, hoarseness, chest pain, syncope, peripheral edema, prolonged cough, headaches, hemoptysis, abdominal pain, melena, hematochezia, severe indigestion/heartburn, hematuria, incontinence, muscle weakness, suspicious skin lesions, transient blindness, difficulty walking, depression, unusual weight change,  abnormal bleeding, enlarged lymph nodes, and angioedema.     Objective:   Physical Exam     WD, Thin, [name] in NAD... GENERAL:  Alert & oriented; pleasant & cooperative... HEENT:  [place]/AT, EOM-wnl, PERRLA, EACs-clear, TMs-wnl, NOSE-clear, THROAT-clear & wnl. NECK:  Supple w/ fairROM; no JVD; normal carotid impulses w/o bruits; no thyromegaly or nodules palpated; no lymphadenopathy. CHEST:  Decr BS bilat & clear to P & A; without wheezes/ rales/ or rhonchi heard... HEART:  Regular Rhythm; without murmurs/ rubs/ or gallops detected...  ABDOMEN:  Thin, soft & nontender; normal bowel sounds; no organomegaly or masses palpated... EXT:  without deformities, mild arthritic changes; no varicose veins/ +venous insuffic/ no edema. BACK:  kyphosis without focal tenderness... NEURO:  CN's intact; no focal neuro deficits; gait abn due to LBP... DERM:  No lesions noted; no rash etc...   Assessment & Plan:

## 2010-05-23 NOTE — Telephone Encounter (Signed)
Scheduled patient on 06/21/10 at 1:45 pm.Left a message for Almyra Free to call me back.

## 2010-05-23 NOTE — Patient Instructions (Signed)
Today we updated your med list & we added MEGACE for your appetite> Take 1 teasp three times daily prior to meals...  Today we did your follow up CXR & blood work... We will call you w/ the results...  We will also set up an appt w/ your Gastroenterologist for further investigation into the cause of your swallowing difficulty (we need to know if this is responsible for your weight loss & poor appetite)...  Call for any questions... Let's plan a follow up visit in about 6 weeks.Marland KitchenMarland Kitchen

## 2010-05-24 ENCOUNTER — Telehealth: Payer: Self-pay | Admitting: *Deleted

## 2010-05-24 NOTE — Telephone Encounter (Signed)
Dr. Kriste Basque wants a patient seen earlier than 06/21/10 1:45 PM.

## 2010-05-24 NOTE — Telephone Encounter (Signed)
Patient r/s to 05/26/10 at 2:30 PM with Willette Cluster, NP

## 2010-05-26 ENCOUNTER — Encounter: Payer: Self-pay | Admitting: Nurse Practitioner

## 2010-05-26 ENCOUNTER — Ambulatory Visit (INDEPENDENT_AMBULATORY_CARE_PROVIDER_SITE_OTHER): Payer: Medicare Other | Admitting: Nurse Practitioner

## 2010-05-26 VITALS — BP 126/54 | HR 64 | Ht 60.0 in | Wt 101.0 lb

## 2010-05-26 DIAGNOSIS — Z860100 Personal history of colon polyps, unspecified: Secondary | ICD-10-CM

## 2010-05-26 DIAGNOSIS — R634 Abnormal weight loss: Secondary | ICD-10-CM

## 2010-05-26 DIAGNOSIS — R1310 Dysphagia, unspecified: Secondary | ICD-10-CM

## 2010-05-26 DIAGNOSIS — D126 Benign neoplasm of colon, unspecified: Secondary | ICD-10-CM

## 2010-05-26 NOTE — Patient Instructions (Signed)
Your barium swallow with tablet is scheduled for 05/30/2010 at 9:00am at [facility], please arrive 15 min early and have nothing to eat or drink after midnight. We will call you with the test result once they are reviewed. Take small bites with liquids to avoid food impaction.

## 2010-05-26 NOTE — Progress Notes (Signed)
05/26/2010 Sherri Crawford [id number] 19-Jul-1920   History of Present Illness:  Sherri Crawford is an 77 year old female who has not been seen here in a few years but is known to Dr. Dickie La for history of adenomatous colon polyps.  EGD for dysphasia April 2008 revealed mild reflux esophagitis and a gastric polyp. Patient describes a poor appetite and when does start eating food will get stuck after a few bites. Eventually has to "spit up" the food. Doesn't happen with every meal. Liquids go down fine. No odynophagia or heartburn.  She has lost about 15 pounds in the last year but now gaining some of it back with two cans of Ensure daily. She has been on Nexium for several years and has no GERD symptoms. She has chronic constipation, eats prunes. No rectal bleeding.     Past Medical History  Diagnosis Date  . Macular degeneration   . COPD (chronic obstructive pulmonary disease)   . Hypertension   . Coronary artery disease   . Hypercholesterolemia   . GERD (gastroesophageal reflux disease)   . Asthmatic bronchitis   . History of colonic polyps   . DJD (degenerative joint disease)   . Lumbar back pain   . Osteoporosis   . History of transient ischemic attack (TIA)   . Anxiety   . Essential thrombocythemia    Past Surgical History  Procedure Date  . Appendectomy   . Cholecystectomy   . Tah and bso 08/21/1995    reports that she has never smoked. She has never used smokeless tobacco. She reports that she drinks alcohol. She reports that she does not use illicit drugs. family history includes Cancer in her maternal aunt; Colon cancer (age of onset:28) in her son; and Crohn's disease in her son. Allergies  Allergen Reactions  . Sulfonamide Derivatives     Pt is unsure of reaction       Outpatient Encounter Prescriptions as of 05/26/2010  Medication Sig Dispense Refill  . albuterol (PROAIR HFA) 108 (90 BASE) MCG/ACT inhaler Inhale 2 puffs into the lungs every 6 (six) hours as needed. As  needed for wheezing       . aspirin 81 MG chewable tablet Chew 81 mg by mouth daily.        Marland Kitchen atorvastatin (LIPITOR) 20 MG tablet Take 20 mg by mouth daily.        . celecoxib (CELEBREX) 200 MG capsule Take 200 mg by mouth daily. As needed for pain      . Cholecalciferol (VITAMIN D3) 1000 UNITS CAPS Take 1 capsule by mouth daily.        Marland Kitchen esomeprazole (NEXIUM) 40 MG capsule Take 40 mg by mouth daily before breakfast.        . Fluticasone-Salmeterol (ADVAIR DISKUS) 250-50 MCG/DOSE AEPB Inhale 1 puff into the lungs every 12 (twelve) hours. Rinse mouth after each use       . hydrocodone-acetaminophen (LORCET-HD) 5-500 MG per capsule Take 1 capsule by mouth every 8 (eight) hours as needed.        . hydroxyurea (HYDREA) 500 MG capsule Take two tabs on Monday and one every other day...Marland KitchenMarland KitchenMay take with food to minimize GI side effects per Dr. Cyndie Chime      . ibandronate (BONIVA) 150 MG tablet Take 150 mg by mouth every 30 (thirty) days. Take in the morning with a full glass of water, on an empty stomach, and do not take anything else by mouth or lie down for the next 30  min.       . metoprolol tartrate (LOPRESSOR) 25 MG tablet Take 25 mg by mouth 2 (two) times daily. As directed for heart and BP       . montelukast (SINGULAIR) 10 MG tablet Take 10 mg by mouth at bedtime.        . Multiple Vitamin (MULTIVITAMIN PO) Take 1 tablet by mouth daily.        . Nutritional Supplements (ENSURE ENLIVE) LIQD Take by mouth. 2-3 cans daily       . pregabalin (LYRICA) 25 MG capsule Take 25 mg by mouth 2 (two) times daily.        . megestrol (MEGACE ORAL) 40 MG/ML suspension Take 5 mLs (200 mg total) by mouth 3 (three) times daily.  240 mL  5  . nitroGLYCERIN (NITROSTAT) 0.4 MG SL tablet Place 0.4 mg under the tongue every 5 (five) minutes as needed.        . traMADol (ULTRAM) 50 MG tablet Take 50 mg by mouth every 6 (six) hours as needed. As needed for pain          Allergies  Allergen Reactions  . Sulfonamide  Derivatives     Pt is unsure of reaction    ROS:  All other systems reviewed and negative except where noted in History of Present Illness.   Physical Exam: General: Petite, white female in no acute distress Head: Normocephalic and atraumatic Eyes:  sclerae anicteric,conjunctive pink. Ears: Normal auditory acuity Mouth: No deformity or lesions Neck: Supple, no masses.  Lungs: Clear throughout to auscultation Heart: Regular rate and rhythm; no murmurs heard Abdomen: Soft, non tender and non distended. No masses or hepatomegaly noted. Normal Bowel sounds Rectal: Not done Musculoskeletal: Symmetrical with no gross deformities  Skin: No lesions on visible extremities Extremities: No edema or deformities noted Neurological: Alert oriented x 4, grossly nonfocal Cervical Nodes:  No significant cervical adenopathy Psychological:  Alert and cooperative. Normal mood and affect   Assessment and Plan: Dysphagia, unspecified Intermittent solid food dysphagia which could be dysmotility or secondary to a ring / stricture. Her previous EGD, done for dysphagia, was negative for strictures/ rings. For further evaluation the patient will be scheduled for barium swallow with tablet.   Weight loss, abnormal Several pound weight loss with loss of appetite. Drinking Ensure and weight at least stablizing now. Etiology of poor appetite not clear. Her dysphagia could be contributing. Further evaluation with EGD.    COLONIC POLYPS Adenomatous polyp April. 2008. She wasn't given a definite recall date, possibly because of her advanced age. At this point will proceed with  evaluation of dysphagia. Following that, we can decide on whether to persue surveillance colonoscopy at some point.

## 2010-05-27 ENCOUNTER — Other Ambulatory Visit: Payer: Self-pay | Admitting: *Deleted

## 2010-05-27 ENCOUNTER — Encounter: Payer: Self-pay | Admitting: Pulmonary Disease

## 2010-05-27 ENCOUNTER — Encounter: Payer: Self-pay | Admitting: Internal Medicine

## 2010-05-27 DIAGNOSIS — R1319 Other dysphagia: Secondary | ICD-10-CM

## 2010-05-27 DIAGNOSIS — M810 Age-related osteoporosis without current pathological fracture: Secondary | ICD-10-CM

## 2010-05-27 DIAGNOSIS — R1310 Dysphagia, unspecified: Secondary | ICD-10-CM | POA: Insufficient documentation

## 2010-05-27 HISTORY — DX: Other dysphagia: R13.19

## 2010-05-27 MED ORDER — IBANDRONATE SODIUM 150 MG PO TABS: ORAL_TABLET | ORAL | Status: AC

## 2010-05-27 NOTE — Assessment & Plan Note (Signed)
As noted, she has DJD, LBP & Osteoporosis on Boniva, calcium , MVI, Vit D, etc;  CXR today shows new part T8 ant wedging (~40% loss of height);  Not c/o incr pain in back & she sees [name] for her chronic back pain;  She has lots of pain meds> Lyrica, Celebrex, Tramadol, Hyrocodone (not taking tramadol) & unclear if Boniva or NSAIDS are affecting her swallowing (referred to GI)... Needs f/u BMD when able.

## 2010-05-27 NOTE — Telephone Encounter (Signed)
RX for Boniva called to [facility] on [address].  @ [phone].

## 2010-05-27 NOTE — Assessment & Plan Note (Signed)
Last yr Lipitor was incr from 10 to 20mg  due to LDL 152 on the lower dose;  Needs f/u FLP but not fasting today & we will check on ret.Marland KitchenMarland Kitchen

## 2010-05-27 NOTE — Assessment & Plan Note (Signed)
Stable on Hydroxyurea per [name];  Continue same dose.Sherri KitchenMarland Crawford

## 2010-05-27 NOTE — Assessment & Plan Note (Signed)
Her CC this visit is wt loss (documented 6# in 2 months), poor appetite, and dysphagia... We discussed minimizing NSAIDs, take Nexium daily, add Megace for appetite while we sched GI follow up for her dysphagia.Marland KitchenMarland KitchenMarland Kitchen

## 2010-05-27 NOTE — Assessment & Plan Note (Signed)
BP has been low w/ her wt loss but she doesn't take the Metop except when BP is >150 she says;  She saw [name] 1/12 & doing well from the cardiovasc perspective- no changes made in her regimen.Marland KitchenMarland Kitchen

## 2010-05-27 NOTE — Assessment & Plan Note (Addendum)
Intermittent solid food dysphagia which could be dysmotility or secondary to a ring / stricture. Her previous EGD, done for dysphagia, was negative for strictures/ rings. For further evaluation the patient will be scheduled for barium swallow with tablet.

## 2010-05-28 NOTE — Assessment & Plan Note (Addendum)
Several pound weight loss with loss of appetite. Drinking Ensure and weight at least stablizing now. Etiology of poor appetite not clear. Her dysphagia could be contributing. Further evaluation with EGD.

## 2010-05-28 NOTE — Assessment & Plan Note (Addendum)
Adenomatous polyp April. 2008. She wasn't given a definite recall date, possibly because of her advanced age. At this point will proceed with  evaluation of dysphagia. Following that, we can decide on whether to persue surveillance colonoscopy at some point.

## 2010-05-30 ENCOUNTER — Ambulatory Visit (HOSPITAL_COMMUNITY)
Admission: RE | Admit: 2010-05-30 | Discharge: 2010-05-30 | Disposition: A | Discharge status: Home / Self Care | Payer: Medicare Other | Source: Ambulatory Visit | Attending: Internal Medicine | Admitting: Internal Medicine

## 2010-05-30 DIAGNOSIS — R634 Abnormal weight loss: Secondary | ICD-10-CM

## 2010-05-30 DIAGNOSIS — K229 Disease of esophagus, unspecified: Secondary | ICD-10-CM | POA: Insufficient documentation

## 2010-05-30 DIAGNOSIS — R1310 Dysphagia, unspecified: Secondary | ICD-10-CM

## 2010-05-30 NOTE — Progress Notes (Signed)
Reviewed and agree with management. Next steps will depend upon Barium swallow results. This should be discussed with Dr. Juanda Chance. If barium swallow negative it could be reasonable to evaluate with EGD given weight loss. At 89 surveillance/screening colonoscopy should not be necessary. Iva Boop, MD, Clementeen Graham

## 2010-06-07 ENCOUNTER — Encounter: Payer: Self-pay | Admitting: *Deleted

## 2010-06-07 ENCOUNTER — Telehealth: Payer: Self-pay | Admitting: *Deleted

## 2010-06-07 NOTE — Telephone Encounter (Signed)
Spoke with patient and gave her results as per Dr. Juanda Chance. Scheduled EGD on 06/16/10 at [facility] with 1 PM arrival. Pre visit on 05/31/10 at 10:30 AM.

## 2010-06-07 NOTE — Telephone Encounter (Signed)
Message copied by Jesse Fall on Tue Jun 07, 2010  9:42 AM ------      Message from: Lina Sar      Created: Mon Jun 06, 2010  5:09 PM       Barium esophagram reviewed, long narrow segment distally, could be stricture or spasm. Please set up for EGD. Es. dyasmotility is a contributing factor.

## 2010-06-07 NOTE — Telephone Encounter (Signed)
Message copied by Jesse Fall on Tue Jun 07, 2010  9:35 AM ------      Message from: Lina Sar      Created: Mon Jun 06, 2010  5:09 PM       Barium esophagram reviewed, long narrow segment distally, could be stricture or spasm. Please set up for EGD. Es. dyasmotility is a contributing factor.

## 2010-06-10 ENCOUNTER — Ambulatory Visit (AMBULATORY_SURGERY_CENTER): Payer: Medicare Other | Admitting: *Deleted

## 2010-06-10 ENCOUNTER — Encounter: Payer: Self-pay | Admitting: Pulmonary Disease

## 2010-06-10 DIAGNOSIS — R1310 Dysphagia, unspecified: Secondary | ICD-10-CM

## 2010-06-13 ENCOUNTER — Ambulatory Visit: Payer: Self-pay | Admitting: Pulmonary Disease

## 2010-06-15 ENCOUNTER — Encounter: Payer: Self-pay | Admitting: Internal Medicine

## 2010-06-16 ENCOUNTER — Encounter: Payer: Self-pay | Admitting: Internal Medicine

## 2010-06-16 ENCOUNTER — Ambulatory Visit (AMBULATORY_SURGERY_CENTER): Payer: Medicare Other | Admitting: Internal Medicine

## 2010-06-16 DIAGNOSIS — K2289 Other specified disease of esophagus: Secondary | ICD-10-CM

## 2010-06-16 DIAGNOSIS — K228 Other specified diseases of esophagus: Secondary | ICD-10-CM

## 2010-06-16 DIAGNOSIS — K222 Esophageal obstruction: Secondary | ICD-10-CM

## 2010-06-16 DIAGNOSIS — R1310 Dysphagia, unspecified: Secondary | ICD-10-CM

## 2010-06-16 DIAGNOSIS — D131 Benign neoplasm of stomach: Secondary | ICD-10-CM

## 2010-06-16 DIAGNOSIS — R933 Abnormal findings on diagnostic imaging of other parts of digestive tract: Secondary | ICD-10-CM

## 2010-06-16 MED ORDER — SODIUM CHLORIDE 0.9 % IV SOLN: 500.0000 mL | INTRAVENOUS | Status: DC

## 2010-06-17 ENCOUNTER — Telehealth: Payer: Self-pay

## 2010-06-17 NOTE — Telephone Encounter (Signed)

## 2010-06-21 ENCOUNTER — Ambulatory Visit: Payer: Medicare Other | Admitting: Internal Medicine

## 2010-06-24 ENCOUNTER — Encounter (HOSPITAL_BASED_OUTPATIENT_CLINIC_OR_DEPARTMENT_OTHER): Payer: Medicare Other | Admitting: Oncology

## 2010-06-24 ENCOUNTER — Other Ambulatory Visit: Payer: Self-pay | Admitting: Oncology

## 2010-06-24 DIAGNOSIS — D47Z9 Other specified neoplasms of uncertain behavior of lymphoid, hematopoietic and related tissue: Secondary | ICD-10-CM

## 2010-06-24 DIAGNOSIS — D473 Essential (hemorrhagic) thrombocythemia: Secondary | ICD-10-CM

## 2010-06-24 LAB — CBC WITH DIFFERENTIAL/PLATELET
BASO%: 0.6 % (ref 0.0–2.0)
Basophils Absolute: 0 10*3/uL (ref 0.0–0.1)
EOS%: 0.7 % (ref 0.0–7.0)
HGB: 11.4 g/dL — ABNORMAL LOW (ref 11.6–15.9)
MCH: 40.2 pg — ABNORMAL HIGH (ref 25.1–34.0)
MCHC: 33.9 g/dL (ref 31.5–36.0)
MCV: 118.7 fL — ABNORMAL HIGH (ref 79.5–101.0)
MONO%: 6 % (ref 0.0–14.0)
RBC: 2.85 10*6/uL — ABNORMAL LOW (ref 3.70–5.45)
RDW: 14.2 % (ref 11.2–14.5)
lymph#: 1.8 10*3/uL (ref 0.9–3.3)

## 2010-06-24 LAB — MORPHOLOGY

## 2010-07-01 ENCOUNTER — Encounter: Payer: Self-pay | Admitting: Adult Health

## 2010-07-01 ENCOUNTER — Encounter (INDEPENDENT_AMBULATORY_CARE_PROVIDER_SITE_OTHER): Payer: Medicare Other | Admitting: *Deleted

## 2010-07-01 ENCOUNTER — Inpatient Hospital Stay (HOSPITAL_COMMUNITY)
Admission: EM | Admit: 2010-07-01 | Discharge: 2010-07-03 | DRG: 301 | Disposition: A | Discharge status: Home Health | Payer: Medicare Other | Attending: Internal Medicine | Admitting: Internal Medicine

## 2010-07-01 ENCOUNTER — Telehealth: Payer: Self-pay | Admitting: Pulmonary Disease

## 2010-07-01 ENCOUNTER — Ambulatory Visit (INDEPENDENT_AMBULATORY_CARE_PROVIDER_SITE_OTHER): Payer: Medicare Other | Admitting: Adult Health

## 2010-07-01 VITALS — BP 124/78 | HR 76 | Temp 98.9°F | Ht 61.0 in | Wt 108.4 lb

## 2010-07-01 DIAGNOSIS — K219 Gastro-esophageal reflux disease without esophagitis: Secondary | ICD-10-CM | POA: Diagnosis present

## 2010-07-01 DIAGNOSIS — H3530 Unspecified macular degeneration: Secondary | ICD-10-CM | POA: Diagnosis present

## 2010-07-01 DIAGNOSIS — K5730 Diverticulosis of large intestine without perforation or abscess without bleeding: Secondary | ICD-10-CM | POA: Diagnosis present

## 2010-07-01 DIAGNOSIS — Z7982 Long term (current) use of aspirin: Secondary | ICD-10-CM

## 2010-07-01 DIAGNOSIS — I82409 Acute embolism and thrombosis of unspecified deep veins of unspecified lower extremity: Secondary | ICD-10-CM

## 2010-07-01 DIAGNOSIS — G8929 Other chronic pain: Secondary | ICD-10-CM | POA: Diagnosis present

## 2010-07-01 DIAGNOSIS — M1990 Unspecified osteoarthritis, unspecified site: Secondary | ICD-10-CM | POA: Diagnosis present

## 2010-07-01 DIAGNOSIS — F411 Generalized anxiety disorder: Secondary | ICD-10-CM | POA: Diagnosis present

## 2010-07-01 DIAGNOSIS — I8010 Phlebitis and thrombophlebitis of unspecified femoral vein: Secondary | ICD-10-CM

## 2010-07-01 DIAGNOSIS — M7989 Other specified soft tissue disorders: Secondary | ICD-10-CM

## 2010-07-01 DIAGNOSIS — R609 Edema, unspecified: Secondary | ICD-10-CM

## 2010-07-01 DIAGNOSIS — E7800 Pure hypercholesterolemia, unspecified: Secondary | ICD-10-CM | POA: Diagnosis present

## 2010-07-01 DIAGNOSIS — I10 Essential (primary) hypertension: Secondary | ICD-10-CM | POA: Diagnosis present

## 2010-07-01 DIAGNOSIS — Z860100 Personal history of colon polyps, unspecified: Secondary | ICD-10-CM

## 2010-07-01 DIAGNOSIS — M549 Dorsalgia, unspecified: Secondary | ICD-10-CM | POA: Diagnosis present

## 2010-07-01 DIAGNOSIS — D473 Essential (hemorrhagic) thrombocythemia: Secondary | ICD-10-CM | POA: Diagnosis present

## 2010-07-01 DIAGNOSIS — Z66 Do not resuscitate: Secondary | ICD-10-CM | POA: Diagnosis present

## 2010-07-01 DIAGNOSIS — E785 Hyperlipidemia, unspecified: Secondary | ICD-10-CM | POA: Diagnosis present

## 2010-07-01 DIAGNOSIS — J4489 Other specified chronic obstructive pulmonary disease: Secondary | ICD-10-CM | POA: Diagnosis present

## 2010-07-01 DIAGNOSIS — Z7901 Long term (current) use of anticoagulants: Secondary | ICD-10-CM

## 2010-07-01 DIAGNOSIS — I2510 Atherosclerotic heart disease of native coronary artery without angina pectoris: Secondary | ICD-10-CM | POA: Diagnosis present

## 2010-07-01 DIAGNOSIS — R600 Localized edema: Secondary | ICD-10-CM

## 2010-07-01 DIAGNOSIS — Z8673 Personal history of transient ischemic attack (TIA), and cerebral infarction without residual deficits: Secondary | ICD-10-CM

## 2010-07-01 DIAGNOSIS — I82402 Acute embolism and thrombosis of unspecified deep veins of left lower extremity: Secondary | ICD-10-CM

## 2010-07-01 DIAGNOSIS — J449 Chronic obstructive pulmonary disease, unspecified: Secondary | ICD-10-CM | POA: Diagnosis present

## 2010-07-01 HISTORY — DX: Localized edema: R60.0

## 2010-07-01 LAB — BASIC METABOLIC PANEL
BUN: 27 mg/dL — ABNORMAL HIGH (ref 6–23)
Calcium: 9.6 mg/dL (ref 8.4–10.5)
GFR calc non Af Amer: >60 mL/min (ref >60)
Glucose, Bld: 136 mg/dL — ABNORMAL HIGH (ref 70–99)

## 2010-07-01 LAB — CBC
HCT: 34.4 % — ABNORMAL LOW (ref 36.0–46.0)
MCHC: 33.1 g/dL (ref 30.0–36.0)
MCV: 115.4 fL — ABNORMAL HIGH (ref 78.0–100.0)
Platelets: 256 10*3/uL (ref 150–400)
RDW: 13.5 % (ref 11.5–15.5)

## 2010-07-01 NOTE — Progress Notes (Signed)
Subjective:    Patient ID: Sherri Crawford, female    DOB: Nov 02, 1920, 77 y.o.   MRN: [id number]  HPI [name] here for a follow up visit... she has mult med problems including: Asthmatic Bronchitis; HBP; CAD; Hyperchol; GERD/ Colon polyps; DJD/ LBP/ Osteoporosis; ?hx TIA; Essential Thrombocytosis on Hydroxyurea per [name]...  ~ July 30, 2009: Hosp x2d by [facility] for what sounds like another NTG misadventure> transient syncopal spell after 1NTG for ?lower CP/ epig discomfort, then weak & had to crawl to bedroom... Exam was unrevealing, EKG's w/o acute changes, Enz & labs= neg, 2DEcho w/ mild LVH, EF= 60-65%, no wall motion abn, gr1 DD, ?mass in RA?; subseq TEE showed norm LV w/ EF= 60-65%, incr thickness of atrial septum c/w severe lipomatous hypertrophy (no mass), mod plaque in Ao... we discussed throwing out her NTG & using Pepcid AC, Mylanta liq, Tramadol for future episodes of discomfort... she has been following up w/ [name] on Hydroxyurea> plat count down to 600K.  ~ May 23, 2010: 9 month ROV & review> she is c/o decr appetite and weight loss assoc w/ ?3wk hx swallowing difficulty that seems intermittent & occurs most often after she has eaten 1/2 of her lunch meal "it doesn't want to go down" & has on occas vomited it back up; she takes Nexium 40mg /d; Weights on office documented 103-105 over the last 6months in 2011, & dropped from 103 to 97# from 1/12 to today (6# wt loss in 27mo); She was instructed to incr Ensure consumption & we discussed Megace trial of appetite & refer to GI for swallowing difficulty (NOTE: prev EGD 2008 showed GERD & esophagitis, &Colon showed tub adenoma)...  AB> Stable on Advair, Singulair, Albuterol prn; No recent exac- doing satis from the pulm standpoint...  HBP/ CAD> On ASA & only takes 25mg  Metoprolol if BP>150 (this is how she likes to do it); She had 2 NTG reactions & this was discontinued 5/11; BP today = 100/50 w/ her wt loss, no angina etc; Last saw  [name] 1/12> stable, no changes, f/u 2yr...  Chol> On Lip20 now & last FLP 4/11 on Lip10 reviewed; She is not fasting today so we can't check FLP, wonder if we even need it now w/ her wt loss, but in view of her signif coronary & Ao calcif & ?prev TIA> rec continue Lip20 for now & f/u FLP when able...  GI> See above...  DJD/ LBP/ Osteop> followed by [name] on Lyrica, Hydrocodone, Celebrex prn; Given last epid steroid shot 1/12, mild benefit; NOTE: today's CXR shows new ant wedging T8 (~40%), last BMD 2008 showed TScore +0.4 in spine (it is not in [employer] EMR)...  Heme> Followed by [name] w/ Essential Thrombocytosis on Hydroxyurea (last note 1/12 reviewed- well controlled on 500mg /d x 1000mg  on Mondays w/ plats betw 218-365K)...  07/01/2010 Acute OV  Pt presents for an acute office visit. Complains of swelling, heat in left lower leg with some discoloration and soreness and a nickel-sized black spot on left shin x1day. Yesterday noticed that left left was swollen all the way up to mid thigh. Felt heavy. Mild soreness along hip area. No known injury or skin tear. No fever. Did noticed circular bruise along mid left shin.   No chest pain or dyspnea   Pt was sent for an urgent venous doppler. Preliminary report with extensive DVT along common femoral. She has been referred to ER for admission for [facility] hospitalist to admit. ER [facility] called with information.  TH paged to notify.    Review of Systems Constitutional:   No  weight loss, night sweats,  Fevers, chills, fatigue, or  lassitude.  HEENT:   No headaches,  Difficulty swallowing,  Tooth/dental problems, or  Sore throat,                No sneezing, itching, ear ache, nasal congestion, post nasal drip,   CV:  No chest pain,  Orthopnea, PND, +swelling in lower extremities, anasarca, dizziness, palpitations, syncope.   GI  No heartburn, indigestion, abdominal pain, nausea, vomiting, diarrhea, change in bowel habits, loss of appetite,  bloody stools.   Resp: No shortness of breath with exertion or at rest.  No excess mucus, no productive cough,  No non-productive cough,  No coughing up of blood.  No change in color of mucus.  No wheezing.  No chest wall deformity  Skin: no rash or lesions.  GU: no dysuria, change in color of urine, no urgency or frequency.  No flank pain, no hematuria   MS:  No joint pain or swelling.  No decreased range of motion.  +hip pain.  Psych:  No change in mood or affect. No depression or anxiety.  No memory loss.          Objective:   Physical Exam GEN: A/Ox3; pleasant , NAD, well nourished petite elderly female.   HEENT:  [place]/AT,  EACs-clear, TMs-wnl, NOSE-clear, THROAT-clear, no lesions, no postnasal drip or exudate noted.   NECK:  Supple w/ fair ROM; no JVD; normal carotid impulses w/o bruits; no thyromegaly or nodules palpated; no lymphadenopathy.  RESP  Clear  P & A; w/o, wheezes/ rales/ or rhonchi.no accessory muscle use, no dullness to percussion  CARD:  RRR, no m/r/g  ,   pulses intact, no cyanosis or clubbing. Left leg twice the size of right, extensive swelling on left to mid/upper thigh. Slightly diminshed pulse in left femoral compared to right.  Venous engorgement /insufficiency along ankles. -neg homans.   GI:   Soft & nt; nml bowel sounds; no organomegaly or masses detected.  Musco: Warm bil, no deformities or joint swelling noted.   Neuro: alert, no focal deficits noted.    Skin: Warm, no lesions or rashes         Assessment & Plan:

## 2010-07-01 NOTE — Assessment & Plan Note (Signed)
Acute extensive left DVT Pt will require admission ,case discussed in detail with Dr. Kriste Basque   Pt referred to ER at [facility] for possible admission by [facility] hospitalist.  Pt aware and to go to ER after doppler is finished.

## 2010-07-01 NOTE — Patient Instructions (Addendum)
We are referring you for venous doppler of leg to rule out blood clot  Keep leg elevated.  I will call after doppler for further instructions.   Late add: +DVt -referred to ER for admission

## 2010-07-01 NOTE — Telephone Encounter (Signed)
Spoke w/ Sherri Crawford and she states she has had an increased swelling in her left leg and she has a black mark on it as well. Sherri Crawford states she is not sure where the mark came from and is concerned. Sherri Crawford states nurse was their and recommended she come in and be seen. I advised Dr. Kriste Basque was not in this afternoon and Sherri Crawford states she wanted to see TP then. Sherri Crawford is coming in at 2:30 to be evaluated

## 2010-07-02 ENCOUNTER — Inpatient Hospital Stay (HOSPITAL_COMMUNITY): Payer: Medicare Other

## 2010-07-02 LAB — CBC
HCT: 32.7 % — ABNORMAL LOW (ref 36.0–46.0)
MCHC: 33 g/dL (ref 30.0–36.0)
MCHC: 33.2 g/dL (ref 30.0–36.0)
Platelets: 264 10*3/uL (ref 150–400)
Platelets: 261 10*3/uL (ref 150–400)
RDW: 13.5 % (ref 11.5–15.5)
RDW: 13.2 % (ref 11.5–15.5)
WBC: 6.5 10*3/uL (ref 4.0–10.5)

## 2010-07-02 LAB — COMPREHENSIVE METABOLIC PANEL
BUN: 24 mg/dL — ABNORMAL HIGH (ref 6–23)
CO2: 25 mEq/L (ref 19–32)
Calcium: 9.3 mg/dL (ref 8.4–10.5)
Chloride: 106 mEq/L (ref 96–112)
Creatinine, Ser: 0.7 mg/dL (ref 0.4–1.2)
GFR calc non Af Amer: >60 mL/min (ref >60)
Total Bilirubin: 0.6 mg/dL (ref 0.3–1.2)

## 2010-07-02 LAB — HOMOCYSTEINE: Homocysteine: 13.2 umol/L (ref 4.0–15.4)

## 2010-07-02 LAB — DIFFERENTIAL
Basophils Absolute: 0 10*3/uL (ref 0.0–0.1)
Eosinophils Absolute: 0.1 10*3/uL (ref 0.0–0.7)
Lymphs Abs: 1.8 10*3/uL (ref 0.7–4.0)
Monocytes Absolute: 0.5 10*3/uL (ref 0.1–1.0)

## 2010-07-02 LAB — PROTIME-INR
INR: 1.26 (ref 0.00–1.49)
Prothrombin Time: 16 seconds — ABNORMAL HIGH (ref 11.6–15.2)

## 2010-07-02 LAB — PHOSPHORUS: Phosphorus: 2.8 mg/dL (ref 2.3–4.6)

## 2010-07-04 ENCOUNTER — Ambulatory Visit: Payer: Medicare Other | Admitting: Pulmonary Disease

## 2010-07-04 ENCOUNTER — Encounter (HOSPITAL_BASED_OUTPATIENT_CLINIC_OR_DEPARTMENT_OTHER): Payer: Medicare Other | Admitting: Oncology

## 2010-07-04 ENCOUNTER — Encounter: Payer: Self-pay | Admitting: Pulmonary Disease

## 2010-07-04 DIAGNOSIS — D473 Essential (hemorrhagic) thrombocythemia: Secondary | ICD-10-CM

## 2010-07-04 DIAGNOSIS — D47Z9 Other specified neoplasms of uncertain behavior of lymphoid, hematopoietic and related tissue: Secondary | ICD-10-CM

## 2010-07-04 LAB — CARDIOLIPIN ANTIBODIES, IGG, IGM, IGA
Anticardiolipin IgA: 10 APL U/mL — ABNORMAL LOW (ref <22)
Anticardiolipin IgG: 10 GPL U/mL — ABNORMAL LOW (ref <23)
Anticardiolipin IgM: 0 MPL U/mL — ABNORMAL LOW (ref <11)

## 2010-07-04 LAB — LUPUS ANTICOAGULANT PANEL
DRVVT: 51.7 secs — ABNORMAL HIGH (ref 36.2–44.3)
Lupus Anticoagulant: DETECTED — AB
dRVVT Incubated 1:1 Mix: 38.3 secs (ref 36.2–44.3)

## 2010-07-04 LAB — BETA-2-GLYCOPROTEIN I ABS, IGG/M/A: Beta-2 Glyco I IgG: 0 G Units (ref <20)

## 2010-07-04 LAB — FACTOR 5 LEIDEN

## 2010-07-04 LAB — PROTEIN C ACTIVITY: Protein C Activity: 152 % — ABNORMAL HIGH (ref 75–133)

## 2010-07-04 LAB — ANTITHROMBIN III: AntiThromb III Func: 114 % (ref 76–126)

## 2010-07-05 LAB — PROTHROMBIN GENE MUTATION

## 2010-07-05 NOTE — Discharge Summary (Signed)
NAME:  Sherri, Crawford               ACCOUNT NO.:  [id number]  MEDICAL RECORD NO.:  [id number]           PATIENT TYPE:  I  LOCATION:  2021                         FACILITY:  [facility]  PHYSICIAN:  Lonia Blood, M.D.       DATE OF BIRTH:  Apr 09, 1920  DATE OF ADMISSION:  07/01/2010 DATE OF DISCHARGE:  07/03/2010                              DISCHARGE SUMMARY   PRIMARY CARE PHYSICIAN:  Lonzo Cloud. Kriste Basque, MD  PRIMARY HEMATOLOGIST:  Levert Feinstein, M.D., F.A.C.P.  DISCHARGE DIAGNOSES: 1. Left lower extremity deep vein thrombosis. 2. Essential thrombocytosis. 3. Chronic asthma. 4. Chronic anxiety. 5. Nonobstructive coronary artery disease. 6. Hyperlipidemia. 7. Hypertension. 8. Gastroesophageal reflux disease. 9. Macular degeneration. 10.Severe osteoarthritis with chronic back pain.  DISCHARGE MEDICATIONS: 1. Coumadin 3 mg daily for the past 2 days, further dosage to be     decided by the primary hematologist, Dr. Cyndie Chime. 2. Lovenox 80 mg daily. 3. Soy for 3 days. 4. Hydrocodone/APAP 5/325, one to two tablets every 4 h. as needed for     pain. 5. Advair 250/50 one puff twice a day. 6. Albuterol 1-2 puffs inhaled every 4-6 h. 7. Nitroglycerin 0.4 mg every 5 minutes as needed for chest pain. 8. Metoprolol 25 mg twice a day. 9. Lipitor 10 mg daily. 10.Nexium 40 mg daily. 11.Boniva 150 mg monthly. 12.Multivitamin daily. 13.Vitamin D 1000 units daily. 14.Celebrex 200 mg daily as needed for pain to use rarely. 15.Lyrica 25 mg twice a day. 16.Hydroxyurea 500 mg daily.  CONDITION ON DISCHARGE:  Sherri Crawford was discharged in good condition, alert, oriented, in no acute distress, 95% saturation on room air, stable vital signs.  She will follow up with her primary care physician Dr. [name] as well as Dr. Cephas Darby.  Home health RN will come to the apartment on Jul 04, 2010, to draw PT and INR and fax it to Dr. Cyndie Chime.  PROCEDURE DURING THIS ADMISSION:  No procedures  done.  CONSULTATION:  No consultations obtained.  HISTORY AND PHYSICAL:  Refer to the dictated H and P which was done by Dr. Adela Glimpse.  Briefly, Sherri Crawford is an 89-year woman with history of essential thrombocytosis who presents to her primary care physician on Friday afternoon complaining of swelling of the left leg.  She was brought into the [facility] outpatient vascular lab, where she was found to have a left lower extremity deep vein thrombosis.  Because of the extent of thrombosis the fact that was Friday afternoon and the fact the patient had no prior education with anticoagulants she was referred for admission in the hospital.  Ms. Lumbra was admitted to telemetry unit for close monitoring.  She did not develop any arrhythmias, chest pain, hemoptysis and shortness of breath.  It became apparent that she clinically does not have a pulmonary emboli.  We proceeded with elevation of the left lower extremity compression stockings and anticoagulation with Lovenox and Coumadin.  By hospital day #2, she had absolutely no pain at rest.  The lower extremity had minimal edema and she was very proficient in self-administering Lovenox.  She was thoroughly educated about  the risks and benefits of anticoagulation, how to activate emergent responses in case of a fatal intracranial bili or the major gastrointestinal bleed.  The patient will follow up to primary hematologist, Dr. Cyndie Chime for regulation of her Coumadin and further testing if necessary.  Please note that as part of her admission workup, a hypercoagulable panel was sent out.  All of the results being in the status of pending by the time of my dictation. The patient's other chronic or multiple medical problems have remained stable.  I have not made any changes in her medications.     Lonia Blood, M.D.     SL/MEDQ  D:  07/03/2010  T:  07/04/2010  Job:  [id number]  cc:   Lonzo Cloud. Kriste Basque, MD Levert Feinstein, M.D.,  F.A.C.P.  Electronically Signed by Lonia Blood M.D. on 07/05/2010 05:46:54 PM

## 2010-07-06 ENCOUNTER — Telehealth: Payer: Self-pay | Admitting: Pulmonary Disease

## 2010-07-06 NOTE — Telephone Encounter (Signed)
[facility].  Sherri Crawford is aware.

## 2010-07-06 NOTE — Telephone Encounter (Signed)
Per Efraim Kaufmann, nurse at [facility], pt was d/c'd from hospital with TED hose and they are extremely long. Nurse wants to know if pt needs the thigh high or knee high hose. We can fax order to her attn:  Blaine Hamper, fax # [phone]. We can call her back directly at [phone]. Pls advise.

## 2010-07-10 DIAGNOSIS — I80299 Phlebitis and thrombophlebitis of other deep vessels of unspecified lower extremity: Secondary | ICD-10-CM

## 2010-07-10 HISTORY — DX: Phlebitis and thrombophlebitis of other deep vessels of unspecified lower extremity: I80.299

## 2010-07-12 NOTE — Assessment & Plan Note (Signed)
She is an 78 year old married woman who is being followed in our [facility] for mid thoracic type back pain which occurs  when she stands up for more than about 15 minutes at a time.   She has been trialed on Neurontin and was titrated up to 400 mg three  times a day.  She states that this has not helped.  She has discontinued  it and is requesting to be retrialed back on Lyrica.   She states that she has been on Lyrica in the past, however, at a  moderately high dose starting at 100 mg which she was unable to tolerate  initially.   She would like to retrial Lyrica at a smaller dose, rather than  Neurontin today.   In clinic today, she states her average pain is localized to the mid  thoracic region.  She has no pain in the clinic right now.  I had her  walk approximately 30-40 feet in the hall today.  She did not have any  increase in her mid thoracic back pain while walking.   She states her pain really only occurs after she has been up for 15  minutes or so.   Pain improves with rest.  She sleeps well.  It does not interrupt her  sleep at night.  Pain is typically worse with walking and standing.   Functional status is rather high.  She is able to walk on a treadmill  for 15 minutes.  She does use a cane outside now and occasionally  indoors.  She is able to climb stairs.  She is able to drive.  She is  independent with her self-care, needs some assistance with higher level  household type activities.   She denies problems controlling bowel or bladder.  Denies depression,  anxiety, denies suicidal ideation.   REVIEW OF SYSTEMS:  Otherwise negative.  She has been active.  She was  able to attend a wedding up in [place].  She continues to exercise  on a treadmill about 15 minutes and she is able to engage in social  activities such as visiting some of her friends.   No changes in past medical, social nor family history.  She states she  did  recently see Dr. Kriste Basque, who told her that she needs to watch her  salt intake.  She has had some very mild lower extremity edema.   MEDICATIONS:  The medications provided by this clinic include  1. Neurontin 400 mg one p.o. t.i.d.  2. Vicodin 5/325.  She is averaging about one pill per month.   PHYSICAL EXAMINATION:  VITAL SIGNS:  Blood pressure is 157/77, pulse 72,  respirations 20, 96% saturated on room air.  GENERAL APPEARANCE:  She is a thin, elderly female who does not appear  in any distress.   She is oriented x3.  Speech is clear.  Affect is bright.  She is alert,  cooperative and pleasant.  She follows commands without difficulty.   Transitioning from sitting to standing is done slowly and carefully.  She has a normal gait, rather short stride length, but base of support  is of normal distance and she has normal heel-toe mechanics.   She has some difficulty with tandem gait.  Romberg's test is performed  adequately.  She has a significant kyphosis and mild scoliosis.   Reflexes in the upper and lower extremities are symmetric and intact.  She has good strength in the upper and  lower extremities without focal  deficit.  No sensory deficits are appreciated.   Palpation down the cervical, thoracic and lumbar spine do not reveal any  areas of tenderness.   IMPRESSION:  1. Thoracic kyphosis.  2. Mild thoracic neuralgia.  3. Balance disorder.   PLAN:  Will discontinue Neurontin.  Will trial her on Lyrica 25 mg one  p.o. nightly x3 days, then b.i.d.   We discussed other options to manage her back pain at this time  including TENS unit, Lidoderm, Flector patch; however, she is not  interested in any of these options and is very insistent on trialing the  Lyrica which she has been on once before.  We will see her back in a  month.  Request that she bring in her Vicodin so we can do pill count  with her.  She states she will comply.  We will see her back in two  weeks.  May  consider titrating her Lyrica up a bit more if she tolerated  it well.  Will continue to observe her for worsening edema.           ______________________________  Brantley Stage, M.D.     DMK/MedQ  D:  07/17/2007 11:27:24  T:  07/17/2007 11:52:58  Job #:  [id number]

## 2010-07-12 NOTE — Assessment & Plan Note (Signed)
Sherri Crawford is a pleasant 77 year old married woman who is followed in  our [facility] for chronic mid thoracic and  occasional lower lumbar back pain.  She has a diagnosis of significant  thoracic kyphosis and some of her pain is felt to be secondary to  occasional thoracic neuralgia and myofascial issues.   She was last seen by me on February 05, 2008.  She has been taking Lyrica  25 mg twice a day over the December.  She had been on Ultracet up to 4  times a day.  However, she states she really only used it on a t.i.d.  dose schedule.   She remained quite active over the Christmas holidays involved in  cooking and baking.   She has had no complaints with medications.  No significant side  effects.  No problems with oversedation, constipation, or dizziness.   She states her average pain has been about 8 on a scale of 10 at the end  of being up for 2 to 2-1/2 hours.   Her sleep is in good.  She reports fairly good relief with the  medications.   FUNCTIONAL STATUS:  Sherri Crawford could stand and walk at least 2 to 2-1/2  hours.  She is able to climb stairs.  She is able to drive.  She is  independent with self-care.  Overall, high functioning individual.   She does use a cane for ambulation at times.   No problems controlling bowel or bladder.  Denies depression, anxiety,  or suicidal ideation.  Review of systems, otherwise noncontributory and  negative.   No changes in past medical, social, or family history since her last  visit.   Exam, her blood pressure is 160/76, pulse 55, respirations 18, 97%  saturated on room air.  She is a well-developed, well-nourished elderly  female who did not appear in any distress.   She is oriented x3.  Speech is clear.  Affect is bright.  She is alert,  cooperative, and pleasant.  She follows commands without difficulty and  answers questions appropriately.   Cranial nerves and coordination are grossly intact.  Reflexes  are 2+ in  the lower extremities.  No abnormal tone is noted.  No clonus is noted.  No tremors are noted.   Sensation is intact.  Motor strength is 5/5 in the lower extremities  with the exception of right hip flexion is 4/5.  She has no tenderness  in the thoracic or lumbar spine with palpation today and soft tissue  adjacent to the spine is also not tender.   She is able to transition from sitting to standing without the use of  her upper extremities.  She has a slightly unstable gait.  She has  difficulty with tandem gait.  Romberg test, however, is performed  adequately.   IMPRESSION:  1. Significant thoracic kyphosis.  2. Mid thoracic neuralgia, which occurs after prolonged standing and      walking more than to 2 to 2-1/2 hours  3. Mild balance disorder.   PLAN:  Sherri Crawford has requested consideration for treatment with  acupuncture.  She states her family is supportive of this and she is  interested in doing a trial of this.   She would like to be set up for this in the next month or so.   He does not need refills on her medication at this time.  She will write  a prescription for her to follow up with  Dr. Wynn Banker for acupuncture.  We will see her back in a month.           ______________________________  Brantley Stage, M.D.     DMK/MedQ  D:  03/11/2008 14:56:20  T:  03/12/2008 03:44:53  Job #:  [id number]   cc:   Erick Colace, M.D.  Fax: [phone]

## 2010-07-12 NOTE — Assessment & Plan Note (Signed)
Sherri Crawford is an 77 year old married female who is followed in  our pain and rehabilitative clinic for mid thoracic back pain which  occurs predominantly when she stands up for more than about 15 minutes  at a time.  She has been trialed on Neurontin recently and was  subsequently taken off that and trialed on Lyrica approximately 2 weeks  ago.  She started on Lyrica 25 mg twice a day.   She comes back in today for a recheck.  She states her average pain is  about a 5 on a scale of 10.  Her pain currently in the clinic at this  time is about a 4.  She reports her pain moderately interferes with her  activity and she reports that her relief from the medication is quite  good.   Her pain is described as stabbing when she stands for longer than 15  minutes.  Patient is typically worse with walking and standing, improves  with rest and heat, medication.  Pain occurs typically during the  daytime when she is active and she is not bothered by her pain in the  evening or at night.   She is independent with her self-care.  She is independent with her  mobility and can walk about 15 minutes at a time.  She is able to climb  stairs and is able to drive.  She occasionally uses a cane.   REVIEW OF SYSTEMS:  Noncontributory.   There are no changes in her past medical, surgical, social or family  history since our last visit which was on Jul 17, 2007.   MEDICATIONS PRESCRIBED BY THIS CLINIC:  1. Vicodin 5/325 not more than 2 per day.  2. Lyrica 25 mg one p.o. twice a day.   PHYSICAL EXAMINATION:  Her blood pressure today is 154/63.  Pulse 72.  Respiration 82, 96% saturated on room air.  She is an elderly female who  is well-developed, well-nourished with a significant thoracic kyphosis  who does not appear in any distress.  She is oriented times 3.  Her  speech is clear.  Her affect is bright.  She is alert, cooperative and  pleasant.  She follows commands without any difficulty.  She  transitions  from sitting to standing easily today.  Her gait in the room is stable.  She has difficulty with tandem gait but is able to perform a Romberg  test adequately.  She has limitations in lumbar motion in all planes but  does not have pain at this time.  She has no spinal tenderness with  palpation in the thoracic or lumbar spine.  As she is seated her  reflexes are noted to be about 1+ at both patellar tendons, 0 at the  ankles.  She denies any sensory deficits to light touch in the lower  extremities and she has good strength in the lower extremities, 5/5  strength at the hip flexors, knee extensors, dorsiflexors, plantar  flexors.   IMPRESSION:  1. Thoracic kyphosis.  2. 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  3. Balance disorder.   PLAN:  She has been on Lyrica 25 mg twice a day now for the last 2  weeks.  She states that this seems to be helping her with her pain and  she feels she is getting good relief now with her pain management.  However, she states that toward the end of the dose period, she does  seem to have increased  pain and was wondering if she might have 1 more  pill per day.  We will write her a new prescription for Lyrica 25 mg 1  p.o. three times a day #90 with 1 refill.  The risk and benefits of  Lyrica were once again reviewed with her which includes dizziness,  drowsiness, sedation, increased risk of falls as well as lower extremity  edema.  She was noted on physical exam to have trace edema in both  pretibial regions today on the exam.  We will continue to monitor her  for this.  We will see her back in a month.  She will also monitor her  weight and let us know if that increases as well.  She appears to be  doing well on this  medication without any complaints of increased sedation or dizziness and  with an overall improvement in her pain scores.  We will see her back in  1 month and request that she bring in her hydrocodone  to be counted next  month.           ______________________________  Brantley Stage, M.D.     DMK/MedQ  D:  07/29/2007 13:09:16  T:  07/29/2007 15:33:26  Job #:  [id number]

## 2010-07-12 NOTE — Assessment & Plan Note (Signed)
Sherri Crawford is an 77 year old married woman who is followed in her [facility] for chronic mid thoracic and occasional lower  back pain.  She is known to have a fairly significant thoracic kyphosis  in hand was felt to be a thoracic neuralgia.  She was last seen by me on  December 11, 2007.  In the interim, she reports she has done well.  She  has had no medical problems and has reported good pain relief with  current medications.  Her average pain is about 7 on the scale of 10  currently in the clinic 6 out of 5.  Pain is localized to the left lower  lumbar region.  She denies any trauma.  She states that she bends up  making cheese straws yesterday for about 2-1/2 hours.   She reports no problems with oversedation or constipation or dizziness  from current medication which includes Lyrica and Ultracet.   She is requesting an increase in the amount of Lyrica she is currently  taking.  She finds both medications to be benefit in managing her pain.   Pain is typically worst when she walks or stands for prolonged period of  time.  She states she can walk at least 1 hour currently.  Pain improves  with heat, exercise, and medications.   She is a high functioning individual able to take care of her self-care  needs, feeding, dressing, bathing, and toileting independently.   Denies problems with bowel and bladder control.  Denies depression,  anxiety, or suicidal ideation.  Denies any new problems with numbness,  tingling, tremors, or weakness.   No new medical problems are noted in the interim.   Social and family history are also otherwise unchanged.   Medications provided through this clinic include Lyrica 25 mg 1 p.o.  b.i.d. and Ultracet 1 tablet p.o. t.i.d. on a p.r.n. basis.   PHYSICAL EXAMINATION:  VITAL SIGNS:  Blood pressure is 157/75, pulse 61,  respirations 18, and O2 saturation is 97% on room air.  GENERAL:  She is a well-developed elderly female who  does not appear in  any distress.  She is oriented x3.  Speech is clear.  Affect is bright.  She is alert, co-operative and pleasant and follows commands without any  difficulty and she answers questions appropriately.  NEURO:  Cranial nerves are grossly intact with the exception of hearing  is slightly diminished.  Co-ordination is intact as well.  Reflexes are  1+ in the patellar and Achilles tendon.  No abnormal tone is noted.  No  clonus is noted.  No tremors are noted.   No sensory deficits are appreciated in the lower extremities.  Manual  muscle testing reveals good strength at hip flexors, knee extensors,  dorsiflexors and plantar flexors and she is able to raise from the chair  without pushing off with her upper extremities congesting a good hip  extensor strength as well.   She has a significant thoracic kyphosis and hyperlordotic cervical sign  and there is no tenderness to palpation over the spinous processes of  her cervical or thoracic or lumbar spine.  She had some minimal  tenderness paraspinals in the left lower lumbar region.   Her gait upon initial standing reveals some unsteadiness, however, after  being up for a few minutes and walking throughout in the hall she  displayed a good balance and normal heel toe mechanic with a normal base  of support.  She does  not use an assisted device during normal gait.   IMPRESSION:  1. Significant thoracic kyphosis.  2. Mid thoracic neuralgia which occurs with prolonged standing and      walking more than 1 to 2-1/2 hours.  3. Mild balance disorder upon initial standing.   PLAN:  During the holidays, we will add 1 more Ultracet to her current  medication.  She is currently taking her Ultracet on a t.i.d. schedule.  We will increase this to q.i.d. as she has been busier in making various  foods and involved socially.   If the Ultracet is not as helpful for her, we may consider reducing her  Ultracet to t.i.d. and trailing  her on Lyrica 25 mg from b.i.d. to  t.i.d.  She will let me know over the next couple weeks if she finds the  extra Ultracet not particularly helpful.  At that point, we will  consider calling her in and increase the dose of Lyrica.  I will see her  back next month.  She has done well on the above medication without  problems of constipation, oversedation, or unsteadiness in her gait.           ______________________________  Brantley Stage, M.D.     DMK/MedQ  D:  02/05/2008 10:46:28  T:  02/06/2008 00:29:36  Job #:  [id number]

## 2010-07-12 NOTE — Assessment & Plan Note (Signed)
Sherri Crawford is an 77 year old married female who is followed in our [facility] for mid thoracic back pain which occurs when  she is standing up for more than 15 minutes at a time.  She has been on  Lyrica now 25 mg twice a day.  We attempted to increase it to 25 mg 3  times a day.  However, she states that it made her gait unsteady when  she added the third dose, so she is back down to 25 mg tablet twice a  day again.   Her pain is about 4 on a scale of 10, currently, was on the average of  about 6 on a scale of 10.  She has pain really only about 1-2 hours per  day.  Otherwise, her pain scores are quite low.  She states that the  pain does interfere with activity quite a bit.  It does inhibit her from  standing for any prolonged period of time.  The pain is described as  stabbing when she is up for a while.  Pain is really only present with  walking and standing, improves with sitting.  She does not have any  trouble with sleeping at night, she has no pain at that time.  She gets  a little relief with current meds prescribed.   Functional status is as follows:  She is able to walk 10-15 minutes at a  time.  She is able to climb stairs and she is able to drive.  She is  independent with self-care including light meal prep.   She denies problems controlling bowel or bladder.  Denies depression,  anxiety, or suicidal ideation.  She does admit to some difficulty with  balance.   REVIEW OF SYSTEMS:  Otherwise, negative.  She reports some limb swelling  towards the mid day but no persistent edema in either leg is noted by  the patient.   No changes in past medical, social, or family history other than a  brother who was recently diagnosed with colon cancer apparently it was  caught early.   MEDICATIONS:  Prescribed through this clinic include:  1. Vicodin 5/325.  She has taken not more than 1 or 2 tablets since      this was prescribed for her.  She states she  really finds that it      is real too strong for her, and she would prefer something not      quite as strong.  2. She is also on Lyrica 25 mg twice a day currently.   PHYSICAL EXAMINATION:  Blood pressure is 123/59, pulse 74, respirations  18, and 96% saturation on room air.  She is a well-developed, well-  nourished thin elderly female with prominent kyphosis.   She is oriented x3.  Her speech is clear.  Her affect is bright.  She is  alert, cooperative, and pleasant.  She follows commands without any  problems.   Cranial nerves are grossly intact.  She transitions from sitting to  standing easily.   Her motor strength in the upper extremities is good.  In the lower  extremities, she has right hip flexor weakness, which is 4/5 compared to  the left.  EHLs are slightly weak bilaterally.  Otherwise, the rest of  her lower extremity exam is within the 5/5 range.   Reflexes are overall diminished in the lower extremities.  Sensation is  intact.  Gait reveals some instability.  She did not  bring her cane  today, however.  She has a little bit of footdrop on the right which  maybe related to the right hip flexor weakness that was seen on manual  muscle testing.   IMPRESSION:  1. Thoracic kyphosis.  2. New right hip flexion weakness.  3. 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  4. Balance disorder.   PLAN:  We will refill her Lyrica today 25 mg 1 p.o. b.i.d. with 2  refills #60, and we will trial her on Ultracet 1 tablet p.o. daily to  twice a day #30 tablets given and 1 refill.  We will also set her up for  some physical therapy to strengthen the right hip flexors.  Should she  not improve much, we would consider possible imaging study of the lumbar  spine to look further into the cause for weakness in this area.  She  understands our plan and agrees, and I have gone over the risks and  benefits of each of the medications I prescribed today.  She would  like  to pursue taking these medications.           ______________________________  Brantley Stage, M.D.     DMK/MedQ  D:  09/04/2007 12:56:16  T:  09/05/2007 07:39:26  Job #:  [id number]

## 2010-07-12 NOTE — Assessment & Plan Note (Signed)
Sherri Crawford is a pleasant active 77 year old married woman, who is  followed in our [facility] for chronic mid thoracic  and occasional lower lumbar back pain.  She has returned to clinic today  for brief recheck and refill of her medications.   Her average pain across the mid to upper thoracic region is about a 2 on  a scale of 10, described as dull achiness found intermittently.   Currently, the pain interferes very little with her activity level,  moderately with her enjoyment of life.  Sleep is good.  She is not  really sure what aggravates her pain.  She believes it is prolonged  standing or walking for a long period of time.   Pain improves with rest and medication.  She reports good relief with  current meds.   She has no known lumbar pain at this time and really very little pain in  the way of the mid thoracic discomfort as well.   FUNCTIONAL STATUS:  She walks with assistance, uses a cane mainly for  balance.  She can walk 1-2 hours at a time.  She is able to stand and do  activities for that period as well.  She is able to climb stairs and she  does not drive.   She is retired.  She is independent with self-care.  She is able to do  some cooking and socialize as she would like to.   REVIEW OF SYSTEMS:  Negative for problems with respect to bowel or  bladder control, depression, anxiety, suicidal ideation.  Denies  tremors, tingling, or dizziness.   She reports no changes in her past medical, social, or family history  since our last visit.   Medications, which she has been on through our Pain and Rehabilitative  Clinic include Lyrica 25 mg twice a day and Ultracet 1 tablet up to 4  times a day.   On exam, her blood pressure is 169/74, pulse 56, respirations 18, 95%  saturated on room air.  She is a well-developed, well-nourished elderly  female, who does not appear in any distress.   She is oriented x3.  Speech is clear.  Affect is bright.   She is alert,  cooperative, and pleasant.  She follows commands without any difficulty,  answers questions appropriately.   Cranial nerves and coordination are intact.  Reflexes are 2+ in the  lower extremities and upper extremities.  No abnormal tone is noted.  No  clonus is noted.  No tremors are appreciated.   Her motor strength is good in the upper extremities.  She has some mild  hip extensor weakness, 4+/5, some difficulty getting out of the chair  and hip flexors are about 4 to 4+/5 bilaterally as well.  Distally lower  extremity strength is in the 5/5 range.   Transitioning from sitting to standing is done while pushing up with  upper extremities.  Her overall balance with normal gait is fair.  Tandem gait.  She does have difficulty with Romberg test; however, is  performed adequately.   She has a prominent kyphosis.  No tenderness, however, is appreciated  with palpation in the lower cervical, thoracic, or lumbar spine today.  She has no pain with range of motion in her neck or in the lumbar spine.   IMPRESSION:  1. Significant thoracic kyphosis.  2. Mid thoracic pain, which occurs after prolonged standing or walking      more than 2 to 2-/1/2 hours  at this time.  3. Mild balance disorder.  4. Mild weakness around hip flexors and extensors, most likely      secondary to deconditioning.   PLAN:  I have requested that Sherri Crawford continue to do her lower  extremity strengthening exercises, which she has done in the past, but  has neglected for a couple of months again.   We will refill her Ultracet as well as her Lyrica as follows, Ultracet 1  p.o. q.i.d. p.r.n. back pain, #120, Lyrica 25 mg 1 p.o. b.i.d. #60, 2  refills each.   Sherri Crawford reports no problems with oversedation, constipation,  dizziness, or worsening of her gait instability with the use of these  medications.  In fact, she reports overall improved function.  She is  able to be up more and be more  active with these medications.  She was  first seen by me on March 27, 2007.  At that time, her average pain  was about an 8 on a scale of 10, currently it is 2 on a scale of 10.   We will see her back in 3 months.  She has been stable on the above  medication.  She takes them as prescribed.  No evidence of aberrant  behavior has been observed with her.           ______________________________  Brantley Stage, M.D.     DMK/MedQ  D:  05/06/2008 08:43:50  T:  05/06/2008 11:28:35  Job #:  [id number]   cc:   Lonzo Cloud. Kriste Basque, MD  [address]  [place]  [place] [address]   Jesse Sans. Wall, MD, FACC  [address]  [address]  [place]  [place] [address]

## 2010-07-12 NOTE — Assessment & Plan Note (Signed)
Sherri Crawford is an 77 year old very pleasant elderly female who was  last seen by me on April 17, 2007.  In the interim, she has been  involved in a physical therapy program to improve balance.   She states that the therapist has recommended a cane for outdoors and on  uneven surfaces.  She states that she feels that the program has seemed  to have helped her balance, and she feels as though she has made some  gains in this area.   She continues to have some pain across the mid back region just inferior  to the scapula.   She states that her pain is not present when she is sitting, resting, or  lying down.  It occurs after she has been standing for a while.   Average pain is about a 6 on a scale of 10.  Currently in the office, it  is at about a 3.   Her pain moderately interferes with her activity and enjoyment of life.   Her sleep tends to be good.  She reports very little relief with current  medications.   She states that the Lidoderm does not help.  She does not find the TENS  unit to be particularly helpful either.  She is asking for an escalation  of the Neurontin.   FUNCTIONAL STATUS:  She is able to walk 10 minutes at a time.  She is  able to climb stairs.  She is able to drive.  She is independent with  her self care.  Denies bowel or bladder control problems.  Denies  depression, anxiety, or suicidal ideation.   REVIEW OF SYSTEMS:  Negative.   Past medical, social, and family history are unchanged.   Medications provided by our clinic currently include Lidoderm patch 5%  on a p.r.n. basis, Neurontin 100 mg up to 3 times a day.   PHYSICAL EXAMINATION:  Blood pressure 168/78, pulse 70, respirations 18,  97% saturated on room air.  She is a thin adult female who does not appear in any distress.  She is  oriented x3.  Speech is clear.  Affect is bright.  She is alert,  cooperative, and pleasant.  She follows commands without difficulty.  Transitioning from  sitting to standing is done with ease.  Her gait in  the room is stable.  She has difficulty with tandem gait; however, is  able to perform a Romberg's test today.  She has limitations in cervical range of motion as well as lumbar  motion.  She has a prominent thoracic kyphosis.  She has no tenderness  today with palpation in the thoracic or lumbar spine or in the  paraspinal muscles.  Reflexes are overall in the 1+ range.  She has normal tone in the lower  extremities.  She has good strength without focal deficits.  No sensory  deficits are appreciated as well.   IMPRESSION:  1. Thoracic neuralgia which occurs with prolonged standing.  Has had      some mild improvement with Neurontin 3 times a day.  2. Resolution of lumbar strain injury.   PLAN:  Will slowly titrate her Neurontin up to 300 mg 3 times a day.  She has also expressed some interest in Lyrica if the Neurontin is not  working satisfactorily for her.  She is also requesting a refill of her  hydrocodone.  She states that Dr. Kriste Basque is no longer filling it for her  and would like [place] to  take that over.  She uses hydrocodone/acetaminophen  5/325 1-2 tablets a day.  Prescriptions were written for Neurontin as  well as Lyrica.  Side effects, risks and benefits regarding these  medications were reviewed with her.  She would like to pursue this  medical treatment.  She will finish her physical therapy program as well  in the upcoming weeks.  We will see her back in one month.  She will let  us know if she has any trouble with her current medications.           ______________________________  Brantley Stage, M.D.     DMK/MedQ  D:  05/22/2007 13:37:53  T:  05/22/2007 17:52:52  Job #:  [id number]

## 2010-07-12 NOTE — Assessment & Plan Note (Signed)
Sherri Crawford is a pleasant, married, 77 year old woman, who is being seen  in our pain and rehabilitative clinic for mid-thoracic back pain, which  really occurs only when she stands up for more than a few minutes.   She has been slowly titrated up on Neurontin over the last couple of  months and is currently now taking 300 mg three times a day.   She states that the medicine seems to be helping fairly well now.  Last  month, she reported that the medicines gave her absolutely no relief.  Now she states that it gives her fair relief.  She is wondering if we  could increase the dosage slightly.   She continues to report good sleep and she also continues to report some  minimal pain now, when she stands up for more than about 20 minutes.  She states that she has been up now, in the kitchen, standing for two  and a half hours, working in the kitchen.   This is a significant improvement, overall, in her activity level, as  she was reporting about ten minutes' worth of ability to stand prior to  this.   Pain is typically worse for her during the day, but does not bother her  at night, and again is typically when she is standing.   She is able to climb stairs.  She is able to drive.  She reports she is  now using a cane when she is outside.   She is a high-level-functioning woman.  She is independent with all of  her self-care, meal prep and shopping, needs some assistance with higher-  level activities in the household duties.   Denies problems controlling bowel or bladder.  Denies depression,  anxiety or suicidal ideation.  Admits to some shortness of breath.  Otherwise, review of systems is negative.   No other changes are noted in past medical, social or family history.   MEDICATIONS:  Medications provided by this clinic include:   1. Neurontin 300 mg one p.o. t.i.d.  2. Vicodin 5/325.  Prescription for #60 was given last month.  She has      used one pill since last month.   She has not found the need to take the Vicodin since the Neurontin seems  to be helping quite a bit.   EXAM:  Reveals blood pressure 142/79, pulse 68, respirations 18, 96%  saturated on room air.  She is a thin, elderly female, who does not appear in any distress.  She is oriented times three.  Her speech is clear.  Her affect is  bright.  She is alert, cooperative and pleasant.  She is able to transition from sitting to standing rather quickly and  with good balance.  She has a significant thoracic kyphosis as she  stands.  Her gait in the room is stable.  She has some difficulty with  tandem gait, but Romberg test is performed adequately.  Cervical range  of motion is quite functional and without any pain.  She has full  shoulder range of motion without discomfort, as well.  Palpation down  her thoracic spine does not reveal any point tenderness, nor does she  have tenderness in the thoracic paraspinal musculature.  She has a mild  amount of tenderness and discomfort in the right lower lumbar paraspinal  muscles today, very mildly tender today.  Her motor strength is 5/5 in her upper and lower extremities without  focal deficit.  No abnormal tone is  noted, no tremors are noted, no  clonus is noted.  Her reflexes are slightly diminished at the patellar tendons, 1+ at the  ankles and 1+ in the upper extremities and symmetric.   IMPRESSION:  1. Significant improvement of thoracic neuralgia with standing with      300 mg of Neurontin t.i.d.  She is able to stand now about two and      a half hours and is able to walk about 20 minutes.  She seems to be      quite pleased with the overall improvement in her pain level.  She      is requesting a slight increase in the medication to help her with      the mid-thoracic pain yet.  2. Balance disorder.   PLAN:  I continued to encourage her to use her cane while she is outside  and on uneven surfaces.  She seems to do quite well indoors.    I have discussed with her that Neurontin can cause some drowsiness and  some gait instability.  I will increase her Neurontin to 400 mg t.i.d.  from 300 mg t.i.d.  I have cautioned her that, if she does feel a bit  more unstable or too drowsy, she should return back to the 300 mg a day  dosage, rather than the 400, and she states she will comply with this  request.  She really has not been taking any of the hydrocodone that was  prescribed last month because the Neurontin seems to be helping her  quite a bit.   She states she is not interested in other means to manage her back pain  at this time.  I have suggested TENS unit, Lidoderm, and have brought up  the possibility of trialing of Flector patch, as well.  However, she  would like to keep her medications and various ways to manage her pain  down to a minimum and would like to forego these latter options.   I will see her back in a month.  I have given her some samples of  Biofreeze again.  I have suggested she try some Lidoderm over her right  knee; she states that she has had some achiness in this knee.  On exam,  I did not appreciate any instability at the joint or effusion.   I will see her back in a month.           ______________________________  Brantley Stage, M.D.     DMK/MedQ  D:  06/19/2007 13:33:39  T:  06/19/2007 13:56:57  Job #:  [id number]

## 2010-07-12 NOTE — Assessment & Plan Note (Signed)
[facility]                            CARDIOLOGY OFFICE NOTE   Sherri Crawford, Sherri Crawford                      MRN:          [id number]  DATE:10/29/2007                            DOB:          [birth year]    Sherri Crawford returns today for followup.  She has been having some  dyspnea on exertion, some occasional chest tightness.  It is not near as  bad as it was last year.   She has a history of nonobstructive coronary artery disease.  The last  stress Myoview was in September 2007, which showed normal left  ventricular function.  No significant ischemia.  Previous cath showed a  50% stenosis in the LAD in 2007.  We have been treating her medically.   CARDIAC MEDICATIONS:  1. Lipitor 10 mg a day.  2. Aspirin 81 mg a day.   PHYSICAL EXAMINATION:  VITAL SIGNS:  Her blood pressure is 140/80, her  pulse is 60 and regular.  HEENT:  Normal.  NECK:  Carotid upstrokes were equal bilaterally without bruits.  No JVD.  Thyroid is not enlarged.  Trachea is midline.  LUNGS:  Clear.  HEART:  Regular rate and rhythm.  No gallop.  PMI is nondisplaced.  ABDOMEN:  Soft, good bowel sounds.  EXTREMITIES:  No cyanosis, clubbing, only trace edema.  Pulses are  intact.  NEURO:  Intact.   Sherri Crawford is stable from our standpoint.  It has been a couple of  years since we have had an objective assessment of her coronaries.  With  the dyspnea on exertion and occasional chest tightness, we will repeat a  stress Myoview.  If this is negative for ischemia, we will see her back  in a year.     Thomas C. Daleen Squibb, MD, [facility]  Electronically Signed    TCW/MedQ  DD: 10/29/2007  DT: 10/30/2007  Job #: [id number]

## 2010-07-12 NOTE — Assessment & Plan Note (Signed)
Sherri Crawford is an 77 year old married female who has been followed in  our pain and rehabilitative clinic for chronic mid thoracic back pain  which occurs mainly when she has been up for more than 2-1/2 hours now.  Her pain in clinic in about a zero on a scale of 10 currently.  After  she has been up 2-1/2 hours she begins to get pain into the right  lateral ribcage area which aggravates her to the point where she needs  to sit or lie down and put a heating pad on it.   She finds that the Lyrica and the Ultracet seemed to be helping  somewhat.  She did have a prescription of Vicodin which she does not use  currently.   FUNCTIONAL STATUS:  She is able to walk 20-25 minutes at a time now.  She is able to climb stairs and drives.  She is independent with  feeding, dressing, bathing, toileting.  She does use a meal preparation  as well.  Basically, she is active throughout the day as well as into  the evening.  She engages in an exercise program.  She does cleaning and  cooking, ironing.  She enjoys socializing in the evening and she reports  that her sleep is actually quite good.   No changes regarding review of systems other than some shortness of  breath.  She reports that no changes in past medical, social, or family  history since she was last seen on October 14, 2007.   PHYSICAL EXAMINATION:  VITAL SIGNS:  Blood pressure is 182/83, pulse 79,  respirations 18, 97% saturated on room air.  GENERAL:  She is well-developed, well-nourished, elderly female who  appears her stated age and does not appear in any distress.  NEUROLOGIC:  She is oriented x3.  Speech is clear.  Affect is bright.  She is alert, cooperative, and pleasant.  She follows commands without  difficulty.  Her cranial nerves are grossly intact with the exception of  some mild hearing deficit.  Coordination is intact.  Reflexes are 1+ at  the patellar and Achillis tendons, 1+ in the upper extremities.  Sensation is intact.   Motor strength is 5/5 at hip flexors, knee  extensors, dorsiflexor, and plantar flexor.  She has some mild deficits  in hip strength posteriorly.  She has little difficulty going from sit  to stand.  Her gait in the room is stable.  She has difficulty with  tandem gait as well as Romberg test.  MUSCULOSKELETAL:  Significant thoracic kyphosis.  She does not have  spinous process tenderness, nor does she have any rib tenderness with  palpation today.  She has preserved range in forward flexion in the  lumbar spine.  She has significant limitations in extension.  Straight  leg raise negative.  Tone is normal.  No tremors are appreciated.   IMPRESSION:  1. Thoracic kyphosis.  2. Mid thoracic neuralgia which occurs with standing and walking for      longer than 2-1/2 hours.  3. Mild balance disorder.   PLAN:  We will refill her Lyrica today 25 mg 1 p.o. b.i.d. #60 and we  will refill her Ultracet 1 p.o. t.i.d. p.r.n. back pain #90, this is  slightly increase from the last month in which she was taking Ultracet  on a b.i.d. schedule.  We have also spent time discussing the timing of  her medications and have made some adjustments to her dose in the  morning.  She had been taking it around 8 or 9 in the morning, and we  will have her take it around 7:30 to see if she can improve the morning  a little bit better.  I will see her back in a month.  Reviewing the e-  chart I think she has not had any thoracic x-rays for about 4 years now.  She believes she may have had some done by Dr. Ophelia Charter prior to her  coming to our clinic which was back in the end of January 2009, may  consider x-ray of the thoracic spine to see if there is any progression  in her kyphosis in that 4-year period.           ______________________________  Brantley Stage, M.D.     DMK/MedQ  D:  12/11/2007 12:49:09  T:  12/12/2007 01:27:32  Job #:  [id number]   cc:   Lonzo Cloud. Kriste Basque, MD  [address]   [place]  [place] [address]

## 2010-07-12 NOTE — Assessment & Plan Note (Signed)
Sherri Crawford is an 77 year old married female who is followed in our [facility] for chronic mid thoracic back pain which  mainly occurs when she has been up from more than 15 minutes at a time.  She is back in today and is here for a recheck and refill of her  medications.   She states her average pain lasts about not more than 2-1/2 hours each  day, about a 6 on a scale of 10.  She indicates that it interferes a  very little with her general activity, moderately with her enjoyment of  life, also in the last month she has increased her walking from 10 to 15  minutes at a time up to 25 minutes.  She is now walking outside and also  is going up and down stairs, holding onto the rail.  She reports overall  good relief with the current medications prescribed through this clinic.   MEDICATIONS:  From this clinic include;  1. Lyrica 25 mg twice a day.  2. Ultracet 1 to 2  tablets p.o. daily.   She is independent with her self-care and reports no new problems with  respect to her review of systems.  She reports overall good sleep and  pain is not so significant during the evening.  When she does get the  pain, it is localized below the shoulder blades across the area of her  kyphosis, radiates across the low mid back region, and she describes it  as sharp and stabbing when it comes on.   No other changes in her past medical, social, or family histories since  her last visit.   PHYSICAL EXAMINATION:  VITAL SIGNS:  Her blood pressure is 139/60, pulse  76, respiration 18, and 94% saturate on room air.  GENERAL:  She is a well-developed, elderly female who appears her stated  age and does not appear in any distress.  She is oriented x3.  Her  speech is clear.  Her affect is bright.  She is alert, cooperative, and  pleasant.  She follows commands easily.   Cranial nerves are grossly intact.  Coordination is intact.  Reflexes  are 1+ and intact at the patellar and Achilles  tendons.  Motor strength  is 5-/5 in the right hip flexors, 5/5 in the left and knee extensors are  5/5.  Dorsiflexors, plantar flexors, as well as knee flexors are all in  the 5/5 range today.   She has no tenderness to palpation down her thoracic or lumbar spine.  Her transitioning from sitting to standing is done with ease.  Gait in  the hall, which was observed is quite stable.  She has a symmetric gait,  no evidence of foot drop, no unsteadiness is noted.  She has a little  difficulty with Romberg test.  Tandem gait is performed today and is a  bit better than at her last visit.  She is able to walk tandem today  without holding on to anything for about 8 steps.   Tone in lower extremity is normal.  No tremors are appreciated today.   IMPRESSION:  1. Thoracic kyphosis and mid back pain.  2. Resolution of right hip flexion weakness.  3. Mild thoracic neuralgia, which occurs with standing and walking.  4. Mild balance disorder.   PLAN:  We refilled her Ultracet today 1 p.o. daily b.i.d. p.r.n. back  pain #60 with 1 refill.  She does not need refills on the Lyrica which  she takes 1 p.o. b.i.d.   Overall, she is doing quite well.  Her overall activity level has  improved from being up about 10 minutes to being able to walk up to 25  minutes at a time now.  She reports overall good pain control with  current medications.  She is not having any trouble with any worsening  of her balance or problems with oversedation.  Overall, she appears to  be  pleased with progress in managing her mid-thoracic back pain.  I will  see her back in 2 months.  She will give [place] a call if she has any  problems related to her medications.           ______________________________  Brantley Stage, M.D.     DMK/MedQ  D:  10/14/2007 11:27:59  T:  10/15/2007 00:23:31  Job #:  [id number]   cc:   Lonzo Cloud. Kriste Basque, MD  [address]  [place]  [place] [address]

## 2010-07-12 NOTE — Assessment & Plan Note (Signed)
[facility]                            CARDIOLOGY OFFICE NOTE   [name], [name]                      MRN:          [id number]  DATE:06/13/2007                            DOB:          Aug 23, 1920    HISTORY:  Sherri Crawford comes in today for followup.  She was having some  chest tightness about a month ago that was not exertion related.  She is  way overdue her followup.  She has not had any further pain recently.  She has a history of reflux and the last time we saw her, we were  concerned that was a problem.  That was in December 2007.   She has a history of nonobstructive coronary artery disease.  Her last  stress Myoview was September 2007.  She has normal left ventricular  function.  She had no previous infarct.  She is being treated for hypertension and hyperlipidemia by Dr. [name].  She has stopped aspirin since her last visit.  She was not clear  on why.   PHYSICAL EXAMINATION:  VITAL SIGNS:  Blood pressure is 186/74, pulse 74  and regular, weight is 116.  HEENT:  Normocephalic, atraumatic.  PERRLA.  Extraocular movements are  intact.  Sclerae are clear.  Face symmetry is normal.  NECK:  Carotid upstrokes are equal bilaterally without bruits, no  thyromegaly.  Neck is supple.  LUNGS:  Clear.  HEART:  Reveals a regular rate and rhythm without gallop.  ABDOMEN:  Soft with good bowel sounds.  There is no midline bruit.  There is no tenderness.  EXTREMITIES:  No cyanosis, clubbing or edema.  Pulses are intact.  NEURO:  Intact.   DIAGNOSTICS:  I have had a long talk with Mrs. Lilja today.  We  checked an EKG which is normal.  Her symptoms do not sound ischemic and  thankfully they have resolved.   I have asked her to start an aspirin 81 mg a day.  She will return in  September at which time we will do a stress Myoview.     Thomas C. Daleen Squibb, MD, [facility]  Electronically Signed    TCW/MedQ  DD: 06/13/2007  DT: 06/13/2007  Job #:  [id number]

## 2010-07-12 NOTE — Assessment & Plan Note (Signed)
[name] is an 77 year old very pleasant elderly female, who was last  seen on 03/27/2007, for the first time for evaluation. She had come to  our clinic for pain complaints relating to back pain, which was  localized to her parascapular region.  This was felt to be secondary to  thoracic neuralgia and she was placed on 100 mg of Neurontin three times  a day.   Upon returning today, she states that the pain in her upper back has  completely improved and she apparently has been a bit more active in the  last three weeks since she was seen. She states she has been doing some  cooking, has made some cookies and had started to do some exercise,  which involves pulling a ball full of water using her upper extremities.  She states after this particular exercise, she developed some lower back  pain. The pain in her low back is nonradiating. It is localized to an  area mainly in her left lumbar paraspinal musculature.   Pain does not keep her up at night. It bothers her more when she up  standing.   She describes the pain as about an 8 on a scale of 10. It interferes  moderately with her general activities. Sleep is good. She gets fair to  good relief with current meds.   She has been able to be up walking about an hour now. She is able to  climb stairs and drive.   She is retired and is independent with her self care and higher level  household activities.   PAST MEDICAL, SOCIAL AND FAMILY HISTORY:  Are unchanged since our last  visit.   MEDICATIONS:  Provided by our clinic include Neurontin 100 mg one p.o.  t.i.d.   PHYSICAL EXAMINATION:  VITAL SIGNS: On exam today, her blood pressure is  164/72. Pulse 64. Respirations 18. 96% saturation on room air.  GENERAL: She is a well-developed and well-nourished elderly female, who  does not appear in any distress.  She is oriented times three. Her  speech is clear. Her affect is bright.  She is alert, cooperative and  pleasant. She follows  commands easily.   Transitioning from sitting to standing is done with ease.   Her gait in the room is nonantalgic.  She does have difficulty with  Tandem gait and she has difficulty with Romberg test as well.   She has limitations in cervical range of motion as well as lumbar range  of motion. Pain is increased with forward flexion and extension.   She has some tenderness in the lumbar paraspinal muscles especially on  the left today.   She has no spinous process tenderness throughout the thoracic or lumbar  spine.   Reflexes are diminished. She has normal tone in the lower extremities.  No new sensory deficits are appreciated. Her motor strength is quite  good.   IMPRESSION:  1. Resolution of thoracic neuralgia with 100 mg of Neurontin three      times a day.  2. New problem lumbar strain injury. If she continues to have pain,      may consider x-rays. Apparently she has access to a work out      facility, which I was unaware of and has attempted to do some      strengthening exercises to tone her abdominal  musculature,      however, she was using upper extremities in pulling with her back,  a weight which appears to have aggravated her lumbar region.   PLAN:  Will write her a prescription for Neurontin 100 mg t.i.d. #90  with a refill. I have also given her some samples of Lidoderm and  Biofreeze topical counter irritant. Recommend she stop the exercises,  which have aggravated her low back and to address her balance issues.  Would like her to start in a physical therapy program with an emphasis  on improving her balance and preventing falls.   She has also expressed some interest in Accu-puncture, however, at this  time will hold off since she is going to be involved in a physical  therapy program.   Once again, I reviewed some of the risks and benefits of using  Neurontin, however, she is on a rather small dose, which seems to have  helped her upper back pain  significantly.  I will see her back in five  weeks.  I also wrote her a prescription for Lidoderm today as well. May  consider radiographs if she continues to have pain in the low back,  which is a new problem for her.           ______________________________  Brantley Stage, M.D.     DMK/MedQ  D:  04/17/2007 12:38:37  T:  04/18/2007 47:82:95  Job #:  [id number]

## 2010-07-12 NOTE — Group Therapy Note (Signed)
Sherri Crawford is an 77 year old, very pleasant, elderly female who was  self-referred.  She has a history of a benign distal esophageal  stricture as well as hypertension, asthmatic bronchitis, high  cholesterol and arthritis.  Her history is also significant for cardiac  catheterization back in 2007.   CHIEF COMPLAINT:  Her chief complaint is mid back pain.  She states her  mid back pain began many years ago and she states it is not associated  with any kind of trauma or injury.   Her pain is typically worse when she is standing or walking for more  than 10 to 12 minutes.  This pain is not associated with any kind of  shortness of breath, diaphoresis.  She denies any problems with pain  radiating down into her upper extremities or her lower extremities.  When this comes on, it is simply localized to the mid thoracic region.  This pain has been evaluated previously by Dr. Ophelia Charter, who performed a  thoracic MRI, which showed disk protrusions in the mid and lower  thoracic spine from T6-7 through T11-12.  No evidence of cord  compression, but it was felt that there may be some neural irritation on  the right at T9-10 and T10-11, read by Dr. Karin Golden.   Pain does interfere with her activity as it limits her doing functional  activities when she is standing for any length of time.  Pain improves  with rest and if she bends forward.  Heat also helps her as well as  medications.   She has been trialed in the past on Lyrica, Lidoderm.  She has trialed a  TENS unit, exercises and Ultram.  None of them seem to have helped much.  She states the Lyrica was trialed at 100 mg and it made her symptomatic  with some dizziness, and she was unable to tolerate Lyrica at 100 mg per  day.   She lives in an assisted living facility.  She is independent with her  self-care.  She is able to prepare meals and do some shopping.  She denies problems controlling bowel or bladder.  She denies  depression, anxiety  or suicidal ideation.   REVIEW OF SYSTEMS:  Positive only for some shortness of breath  intermittently.   PAST MEDICAL HISTORY:  Positive for hypertension, glaucoma,  hypercholesterolemia, esophageal stricture, asthmatic bronchitis.   SOCIAL HISTORY:  Patient lives with her spouse, is married.  Denies  smoking.   FAMILY HISTORY:  Positive for mother who died age 68 of stroke.  Father  died age 70 of anemia and an aneurysm.   ALLERGIES:  None known.   MEDICATIONS:  1. Nexium 40 mg 1 p.o. daily.  2. Celebrex 200 mg 1 p.o. daily.  However, has been recently      discontinued.  3. Singulair 10 mg 1 p.o. daily.  4. Lipitor 10 mg.  5. Metoprolol 50 mg.  6. Hydrocodone __________  5/500, not more than 2 daily.  7. Boniva 150 mg every month.  8. Advair.   PHYSICAL EXAMINATION:  On exam today her blood pressure is 192/92 in the  right arm.  Left arm 180/87.  Pulse 69, respirations 18, 96% saturated  on room air.  She is well-developed, well-nourished, elderly female who  does not appear in any distress.  She is oriented x3 and her speech is clear.  Her affect is bright.  She  is alert, cooperative, and pleasant.  She answers all questions  appropriately.  She follows commands easily.   Transitioning from sitting to standing is done with ease.  Her gait in  the room is stable.  Tandem gait she does have a little difficulty with.  Romberg's test however is performed adequately.   She has thoracic kyphosis.  Very little tenderness with palpation  thoracic or lumbar perispinal muscles. No significant tenderness noted  over the spinous processes of the thoracic or lumbar spine as well.   She has full range of motion in bilateral shoulders.  She has very  minima limitations in cervical range of motion.  Lumbar range of motion  is mildly limited as well.   Seated her reflexes are slightly diminished but intact at the patellar  tendon, decreased at the Achilles tendons bilaterally.  No  abnormal tone  is noted.  No clonus is noted.  She has intact sensation with light  touch in lower extremities.  Vibratory sense is intact as well.   She has good strength in the lower extremities.  5/5 strength at hip  flexors, knee extensors, dorsiflexors, plantar flexors, EHL  Straight  leg raise is negative.   IMPRESSION:  Thoracic neuralgia.   PLAN:  1,  Patient has been trialed on Lyrica at 100 mg, which she did  not tolerate.  She has also trialed Lidoderm, TENS unit and exercise as  well as Ultram, which have not seemed to help her significantly.  She  does note that the hydrocodone does seem to help relieve her pain fairly  well.  She is wondering if there is something that might be able to  allow her to stand up for a longer period of time.   1. Would like to trial her on Neurontin starting out at a very low      dose, 100 mg 1 p.o. q. 3 days, then 1 p.o. b.i.d. x3 days, then 1      p.o. t.i.d. for a total of 300 mg per day at the end of the 9th      day.  2. We will see her back in about two weeks and reassess.  Anticipate      she might need a little bit increase in this dose and will give her      this trial on the Neurontin.  If this does not seem to help, we may      go back to Lyrica only at a much smaller dose than she was      initially started on.   Her chart has been reviewed from Dr. Ophelia Charter and from Dr. Juanda Chance, who has  assessed her in the past as well as [name], who is her  cardiologist.           ______________________________  Brantley Stage, M.D.     DMK/MedQ  D:  03/27/2007 14:59:05  T:  03/27/2007 21:56:56  Job #:  [id number]

## 2010-07-13 ENCOUNTER — Ambulatory Visit (INDEPENDENT_AMBULATORY_CARE_PROVIDER_SITE_OTHER): Payer: Medicare Other | Admitting: Pulmonary Disease

## 2010-07-13 DIAGNOSIS — M545 Low back pain, unspecified: Secondary | ICD-10-CM

## 2010-07-13 DIAGNOSIS — D473 Essential (hemorrhagic) thrombocythemia: Secondary | ICD-10-CM

## 2010-07-13 DIAGNOSIS — I2510 Atherosclerotic heart disease of native coronary artery without angina pectoris: Secondary | ICD-10-CM

## 2010-07-13 DIAGNOSIS — M1990 Unspecified osteoarthritis, unspecified site: Secondary | ICD-10-CM

## 2010-07-13 DIAGNOSIS — I82409 Acute embolism and thrombosis of unspecified deep veins of unspecified lower extremity: Secondary | ICD-10-CM

## 2010-07-13 DIAGNOSIS — I10 Essential (primary) hypertension: Secondary | ICD-10-CM

## 2010-07-13 DIAGNOSIS — M810 Age-related osteoporosis without current pathological fracture: Secondary | ICD-10-CM

## 2010-07-13 DIAGNOSIS — J449 Chronic obstructive pulmonary disease, unspecified: Secondary | ICD-10-CM

## 2010-07-13 DIAGNOSIS — E7800 Pure hypercholesterolemia, unspecified: Secondary | ICD-10-CM

## 2010-07-13 NOTE — Patient Instructions (Signed)
Today we updated your med list in EPIC...    Continue your current meds the same...  Since you are seeing [name] regularly for your Protimes & blood counts, we will plan to see you back for a routine check in about 4 months... But please call for any problems we can assist you with at any time.Marland KitchenMarland Kitchen

## 2010-07-13 NOTE — Progress Notes (Signed)
Subjective:    Patient ID: Sherri Crawford, female    DOB: [birth year], 77 y.o.   MRN: [id number]  HPI  [name] here for a follow up visit... she has mult med problems including:  Asthmatic Bronchitis;  HBP;  CAD;  Hyperchol;  GERD/ Colon polyps;  DJD/ LBP/ Osteoporosis;  ?hx TIA;  Essential Thrombocytosis on Hydroxyurea per [name]...  ~  July 30, 2009:  Hosp x2d by [facility] for what sounds like another NTG misadventure> transient syncopal spell after 1NTG for ?lower CP/ epig discomfort, then weak & had to crawl to bedroom...  Exam was unrevealing, EKG's w/o acute changes, Enz & labs= neg, 2DEcho w/ mild LVH, EF= 60-65%, no wall motion abn, gr1 DD, ?mass in RA?;  subseq TEE showed norm LV w/ EF= 60-65%, incr thickness of atrial septum c/w severe lipomatous hypertrophy (no mass), mod plaque in Ao... we discussed throwing out her NTG & using Pepcid AC, Mylanta liq, Tramadol for future episodes of discomfort... she has been following up w/ [name] on Hydroxyurea> plat count down to 600K.   ~  May 23, 2010:  9 month ROV & review> she is c/o decr appetite and weight loss assoc w/ ?3wk hx swallowing difficulty that seems intermittent & occurs most often after she has eaten 1/2 of her lunch meal "it doesn't want to go down" & has on occas vomited it back up; she takes Nexium 40mg /d; Weights on office documented 103-105 over the last 6months in 2011, & dropped from 103 to 97# from 1/12 to today (6# wt loss in 68mo);  She was instructed to incr Ensure consumption & we discussed Megace trial of appetite & refer to GI for swallowing difficulty (NOTE: prev EGD 2008 showed GERD & esophagitis, &Colon showed tub adenoma)...    AB>  Stable on Advair, Singulair, Albuterol prn;  No recent exac- doing satis from the pulm standpoint...    HBP/ CAD>  On ASA & only takes 25mg  Metoprolol if BP>150 (this is how she likes to do it);  She had 2 NTG reactions & this was discontinued 5/11;  BP today = 100/50 w/ her wt  loss, no angina etc;  Last saw [name] 1/12> stable, no changes, f/u 41yr...    Chol>  On Lip20 now & last FLP 4/11 on Lip10 reviewed;  She is not fasting today so we can't check FLP, wonder if we even need it now w/ her wt loss, but in view of her signif coronary & Ao calcif & ?prev TIA> rec continue Lip20 for now & f/u FLP when able...    GI>  See above...    DJD/ LBP/ Osteop> followed by [name] on Lyrica, Hydrocodone, Celebrex prn;  Given last epid steroid shot 1/12, mild benefit;  NOTE: today's CXR shows new ant wedging T8 (~40%), last BMD 2008 showed TScore +0.4 in spine (it is not in [employer] EMR)...    Heme>  Followed by [name] w/ Essential Thrombocytosis on Hydroxyurea (last note 1/12 reviewed- well controlled on 500mg /d x 1000mg  on Mondays w/ plats betw 218-365K)...  ~  Jul 13, 2010:  68mo ROV & post hosp check> GI eval 4/12 DrGessner showed abn Ba Esophagram w/ diffuse motility disorder & narrowing of distal esoph, plus laryngeal penetration; subseq EGD by [name] showed a presbyesoph & spasm in the distal esoph w/o stricture, Savory dilatation done, mult sm gastric polyps noted...  She was Adm 5/4 - 07/03/10 w/ left leg DVT, unprovoked (hypercoag panel- pending), w/  hx underlying essential thrombocytosis on Hydroxyurea from [name];  Started on Coumadin w/ Lovenox bridge & early disch w/ protimes via visiting nurse & called to [name] who already checks CBC monthly...  Feeling better since the hosp, denies CP/ palpit/ SOB, & swelling in left leg is diminished- just mild now & she knows to elim salt, elevate leg, wear support hose...          Problem List:  MACULAR DEGENERATION (ICD-362.50) - sees [name] for follow up- on Vitamin therapy...  ASTHMATIC BRONCHITIS & COPD - she is stable on ADVAIR 250Bid + Singulair 10mg /d & Prn albuterol... no recent exac and denies cough, sputum, hemoptysis, worsening dyspnea,  wheezing, chest pains, snoring, daytime hypersomnolence, etc...  she still takes allergy shots every 2weeks at [facility]. ~  10/10:  we discussed trying to wean the Singulair if able, & wrote new Rx for PROAIR Prn.  HYPERTENSION,  CORONARY ARTERY DISEASE,  RBBB - on METOPROLOL 25mg  Bid & off ASA mow that she's on Coumadin... she has known non-obstructive CAD and followed by [name]... she was prev on Dyazide but stopped this med... ~  Nuclear Stress Test 9/09 was normal- no infarct, no ischemia, EF= 74%... ~  3/11 & 5/11 >> 2 episodes of NTG reactions (see above) therefore NTG discontinued... ~  3/12:  BP= 110/70 & she takes METOPROLOL 25mg  if her BP is > 150 (this is how she likes to do it); denies HA, fatigue, visual changes, recurrent CP, palipit, dizziness, syncope, dyspnea, edema, etc... ~  5/12 Hosp w/ left leg DVT> CXR showed norm heart size, clear lungs x atx at bases, old T8 compression;  EKG showed SBrady, RBBB; Coumadin started & ASA discontinued. ~  5/12:  BP= 118/60 & she is essent asymptomatic now...  DVT>  On Coumadin started 5/12 Hosp & protimes monitored by [name]...  HYPERCHOLESTEROLEMIA - on diet + LIPITOR 20mg /d now... tolerated well...  ~  FLP 1/08 showed TChol 198, TG 129, HDL 59, LDL 113... ~  FLP 5/09 showed TChol 168, TG 100, HDL 49, LDL 99... ~  FLP 4/10 on Lip10 showed TChol 183, TG 117, HDL 50, LDL 109... rec> same med, better diet. ~  FLP 4/11 on Lip10 showed TChol 223, Tg 87, HDL 62, LDL 152... rec incr Lip20.  GERD - on NEXIUM 40mg /d... ~  [name] did an EGD 4/08 showing GERD, esophagitis and Rx w/ NEXIUM 40/d (it helps)... ~  EGD 4/12 by [name] showed a presbyesoph & spasm in the distal esoph w/o stricture, Savory dilatation done, mult sm gastric polyps noted...    COLONIC POLYPS - last colonoscopy was 4/08 by [name] showing 3mm polyp= tubular adenoma...   DEGENERATIVE JOINT DISEASE (ICD-715.90) BACK PAIN, LUMBAR - she has signif Back Pain and is followed in the pain clinic ([name] now)... see above >> on  Lyrica 25mg /d + Prn Celebrex and Tramadol...  OSTEOPOROSIS (ICD-733.00) - on BONIVA 150mg /mo + Calcium & Vitamins... ~  BMD here 5/01 showed TScores -0.1 in Spine, & -1.4 in left FemNeck ~  BMD here 7/08 showed TScores +0.4 in Spine, & -1.6 in left FemNeck...  ~  labs 4/10 showed Vit D level = 34... rec> take Vit D 1000 u OTC daily.  TRANSIENT ISCHEMIC ATTACKS, HX OF - she has sm vessel disease on prev scans... on 81mg  ECASA daily... ~  3/11: went to ER w/ episode of CP, s/p 3 NTG, developed left arm weakness, no speech or leg symptoms... she was off ASA for  several days in anticipation of shot from [name]... ? NTG related, ?off ASA related, ?need for Plavix... decision made to stay on ASA, limit NTG.  ANXIETY (ICD-300.00) - not currently on medication...  ESSENTIAL THROMBOCYTHEMIA (ICD-238.71) - noted to have rising platelet count & referred to [facility] 4/11 w/ Dx of Essential Thrombocytosis/ myeloproliferative disorder & started on HYDROXYUREA 500mg /d w/ titration from DrG... ~  labs 4/11 showed platlet count = 994K... Hydroxyurea started by Heme. ~  labs 5/11 showed platelet count = 602K ~  Labs 3/12 showed Hg= 12.2 (MCV=118), WBC= 5.7, Plat=294K ~  Labs in hosp 5/12 showed Hg down to 10.7, MCV= 116...   Past Surgical History  Procedure Date  . Appendectomy   . Cholecystectomy   . Tah and bso 08/21/1995    Outpatient Encounter Prescriptions as of 07/13/2010  Medication Sig Dispense Refill  . albuterol (PROAIR HFA) 108 (90 BASE) MCG/ACT inhaler Inhale 2 puffs into the lungs every 6 (six) hours as needed. As needed for wheezing       . atorvastatin (LIPITOR) 20 MG tablet Take 20 mg by mouth daily.        . Cholecalciferol (VITAMIN D3) 1000 UNITS CAPS Take 1 capsule by mouth daily.        Marland Kitchen esomeprazole (NEXIUM) 40 MG capsule Take 40 mg by mouth daily before breakfast.        . Fluticasone-Salmeterol (ADVAIR DISKUS) 250-50 MCG/DOSE AEPB Inhale 1 puff into the lungs every 12  (twelve) hours. Rinse mouth after each use       . hydrocodone-acetaminophen (LORCET-HD) 5-500 MG per capsule Take 1 capsule by mouth every 8 (eight) hours as needed.        . hydroxyurea (HYDREA) 500 MG capsule Take two tabs on Monday and one every day for the rest of the week...Marland KitchenMarland KitchenMay take with food to minimize GI side effects per Dr. Cyndie Chime      . ibandronate (BONIVA) 150 MG tablet 1 tablet every 30 days, in the A.M. with a full glass of water, on an empty stomach.  Do not take anything else by mouth or lie down for the next 30 min.  1 tablet  3  . megestrol (MEGACE) 40 MG/ML suspension Take by mouth Twice daily.      . metoprolol tartrate (LOPRESSOR) 25 MG tablet Take 25 mg by mouth 2 (two) times daily. As directed for heart and BP       . montelukast (SINGULAIR) 10 MG tablet Take 10 mg by mouth at bedtime.        . Multiple Vitamin (MULTIVITAMIN PO) Take 1 tablet by mouth daily.        . nitroGLYCERIN (NITROSTAT) 0.4 MG SL tablet Place 0.4 mg under the tongue every 5 (five) minutes as needed.        . Nutritional Supplements (ENSURE ENLIVE) LIQD Take 1 Can by mouth daily.       . pregabalin (LYRICA) 25 MG capsule Take 25 mg by mouth 2 (two) times daily.        Marland Kitchen warfarin (COUMADIN) 3 MG tablet Take as directed       . DISCONTD: aspirin 81 MG chewable tablet        . DISCONTD: celecoxib (CELEBREX) 200 MG capsule         Allergies  Allergen Reactions  . Sulfonamide Derivatives     Pt is unsure of reaction    Review of Systems        See HPI -  all other systems neg except as noted... The patient complains of decreased hearing and dyspnea on exertion.  The patient denies anorexia, fever, weight loss, weight gain, vision loss, hoarseness, chest pain, syncope, peripheral edema, prolonged cough, headaches, hemoptysis, abdominal pain, melena, hematochezia, severe indigestion/heartburn, hematuria, incontinence, muscle weakness, suspicious skin lesions, transient blindness, difficulty  walking, depression, unusual weight change, abnormal bleeding, enlarged lymph nodes, and angioedema.     Objective:   Physical Exam     WD, Thin, [name] in NAD... GENERAL:  Alert & oriented; pleasant & cooperative... HEENT:  [place]/AT, EOM-wnl, PERRLA, EACs-clear, TMs-wnl, NOSE-clear, THROAT-clear & wnl. NECK:  Supple w/ fairROM; no JVD; normal carotid impulses w/o bruits; no thyromegaly or nodules palpated; no lymphadenopathy. CHEST:  Decr BS bilat & clear to P & A; without wheezes/ rales/ or rhonchi heard... HEART:  Regular Rhythm; without murmurs/ rubs/ or gallops detected...  ABDOMEN:  Thin, soft & nontender; normal bowel sounds; no organomegaly or masses palpated... EXT: without deformities, mild arthritic changes; no varicose veins/ +venous insuffic/ no edema. BACK:  kyphosis without focal tenderness... NEURO:  CN's intact; no focal neuro deficits; gait abn due to LBP... DERM:  No lesions noted; no rash etc...   Assessment & Plan:   DVT>  She is on Coumadin w/ protimes by visiting nurses & called to [name] who will be monitoring this going forward since he already monitors her Hydroxyurea for the thrombocytosis...  Essential Thrombocytosis>  As above, stable on Hydroxyurea, monitored by DrG...  COPD>  Stable on Advair & uses the Proair Prn...  HBP/ CAD>  Stable on Metoprolol, no angina etc...  CHOL>  Needs to ret for FLP on the Lip20...  DJD/ Osteoporosis>  She will need f/u BMD when able & consider bisphos drug holiday...  Other medical problems as noted.Marland KitchenMarland Kitchen

## 2010-07-14 ENCOUNTER — Encounter: Payer: Self-pay | Admitting: Pulmonary Disease

## 2010-07-15 NOTE — Assessment & Plan Note (Signed)
[facility]                              CARDIOLOGY OFFICE NOTE   Sherri, Crawford                      MRN:          [id number]  DATE:11/09/2005                            DOB:          1920-03-02    OUTPATIENT CARDIAC CATH.   I was asked by Dr. [name] to evaluate Sherri Crawford, an 77 year old  married white female, from [place], with new onset chest discomfort and  jaw aching.   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  It lasted for about 15 minutes.  She had some sharp pain in her chest, but  more important she had some aching in both jaws.   She has several risk factors including age, hypertension and hyperlipidemia  which have been treated for at least 10 years.  We actually saw her about 10  years ago and did a stress Cardiolite which was negative at the time.   PAST MEDICAL HISTORY:  1. SHE HAS A DRUG ALLERGY TO SULFA.  2. She has not had a reaction to contrast.  3. She does no smoke, drink.  4. She has no history of diabetes.  5. She walks on a regular basis.   CURRENT MEDS:  1. Advair 250/50 one puff twice a day.  2. Centrum Silver.  3. Singulair 10 mg a day.  4. Triamterene/HCTZ 37.5/25 one daily.  5. Lipitor 10 mg a day.  6. Glucosamine 1000 mg daily.  7. Potassium 110 mEq a day.  8. Os-Cal 500 mg a day.  9. Allergy shot q.week.  10.Aspirin 81 mg a day.  11.Boniva.  12.Ginseng.  13.Nexium 40 mg a day.  14.Antivert p.r.n.   SURGICAL HISTORY:  1. She has had an ovarian tumor resection in 1995.  2. Appendectomy in 2000.   FAMILY HISTORY:  Is not consequential considering her age.   OCCUPATION:  She is a housewife.  Her husband is here today as is her  daughter.  She has 3 children.   REVIEW OF SYSTEMS:  Other than some allergies and a history of asthma and  some arthritis, she has no other issues based on total review of systems  checklist.   PHYSICAL EXAM:  She is in  no acute distress.  Her skin is warm and dry.  Her  blood pressure is 132/68, her pulse is 71 and regular, she is 5 feet tall,  weighs 114 pounds, she wears glasses.  PERRLA, extraocular movements intact, sclera clear.  Facial symmetry is  normal.  Carotid upstrokes are equal bilaterally without bruits, there is no  JVD.  THYROID:  Not enlarged.  TRACHEA:  Midline.  LUNGS:  Clear.  HEART:  Reveals a soft S1, S2 without murmur, rub or gallop.  ABDOMINAL EXAM:  Soft with good bowel sounds and no midline bruits, no  hepatosplenomegaly.  There is good bowel sounds.  EXTREMITIES:  No cyanosis, clubbing or edema.  Pulses are intact.  She has  varicose veins.  NEURO EXAM:  Intact.   Electrocardiogram is essentially normal.   ASSESSMENT AND PLAN:  [name] has jaw aching which is worrisome for an  episode of angina.  She has risk factors of age, hyperlipidemia and  hypertension and __________ to have some atherosclerotic disease, as I  discussed with her and her family today.   RECOMMENDATION:  1. Outpatient cardiac catheterization with appropriate intervention as      dictated by findings.  2. Continue aspirin.  3. Instructions given on calling 911 if she has persistent pain for      greater than 15 minutes.  4. Continue her other medications.  5. Sublingual nitroglycerin given with instructions.                               Thomas C. Daleen Squibb, MD, [facility]    TCW/MedQ  DD:  11/09/2005  DT:  11/10/2005  Job #:  [id number]   cc:   Lonzo Cloud. Kriste Basque, MD

## 2010-07-15 NOTE — Assessment & Plan Note (Signed)
[facility]                         GASTROENTEROLOGY OFFICE NOTE   Sherri Crawford, Sherri Crawford                      MRN:          [id number]  DATE:05/02/2006                            DOB:          February 20, 1921    NEW PATIENT EVALUATION   Ms. Oshiro is a very nice 77 year old white female who is here today to  discuss two issues.  One is a repeat colonoscopy for followup of  adenomatous polyp of the colon found in April 2002.  The other issue is  increasing epigastric fullness with dysphagia which she has noticed over  the past year.  She has a history of a benign distal esophageal  stricture dilated with Savary dilators in April 2002.  She has  gastroesophageal reflux disease controlled with Nexium 40 mg daily.  It  is only in the last year that she has started noticing solid food being  stuck.  She has not had any food impaction, but she is sometimes worried  about not being able to pass the food.  Her bowel habits have been  regular.  There is no family history of colon cancer, and she denies any  rectal bleeding.  She has not taken any laxative except for occasional  prunes.   MEDICATIONS:  1. Advair 250/50 1 puff twice a day.  2. Centrum Silver.  3. Singulair 10 mg daily.  4. Dyazide 1 daily.  5. Lipitor 10 mg p.o. daily.  6. Glucosamine.  7. K-Lor.  8. Os-Cal 500 mg p.o. daily.  9. Celebrex 200 mg p.o. daily.  10.Nexium 40 mg p.o. daily.  11.Boniva monthly.  12.Aspirin 81 mg p.o. daily.  13.B6 and B12 vitamins.   PAST HISTORY:  Significant for cardiac catheterization in August 2007.  She had a hysterectomy and appendectomy in the past.  She has a history  of high blood pressure, asthmatic bronchitis, high cholesterol and  arthritis.   FAMILY HISTORY:  Negative for colon cancer.   SOCIAL HISTORY:  Married with 2 children.  She worked for [employer].   REVIEW OF SYSTEMS:  Arthritis, back pain, vision changes.   PHYSICAL  EXAMINATION:  Blood pressure 142/80, pulse 56, weight 116  pounds.  She was alert, oriented and in no distress.  LUNGS:  Clear to auscultation.  COR:  With normal S1, normal S2.  ABDOMEN:  Soft, nontender with normoactive bowel sounds.  RECTAL:  Shows normal rectal tone.  Stool was soft.  Hemoccult negative.  EXTREMITIES:  No edema.   IMPRESSION:  1. An 77 year old white female with a history of benign distal      esophageal stricture with progressive solid food dysphagia      suggestive of a progressive stricture.  2. A history of adenomatous polyp, tubular adenoma of the left colon,      removed on colonoscopy in April 2002.  The patient was due for      repeat colonoscopy in April 2007.   PLAN:  1. Schedule upper endoscopy with dilatation.  2. Schedule colonoscopy.  3. Continue high fiber diet.  4. Continue Nexium 40 mg daily.  Hedwig Morton. Juanda Chance, MD  Electronically Signed    DMB/MedQ  DD: 05/02/2006  DT: 05/02/2006  Job #: [id number]   cc:   Lonzo Cloud. Kriste Basque, MD

## 2010-07-15 NOTE — Assessment & Plan Note (Signed)
[facility]                               PULMONARY OFFICE NOTE   NAME:CROWDERJazira, Maloney                      MRN:          [id number]  DATE:11/07/2005                            DOB:          March 09, 1920    HISTORY:  This patient is a very pleasant 77 year old white female of Dr.  Kriste Basque has a known history of asthmatic bronchitis, hypertension,  hyperlipidemia __________ .  The patient reports approximately at 9:00 last  evening, she developed an episode of midsternal chest pains, intermittent  sternal chest pains that lasted approximately 30 to 45 minutes.  Patient  also had associated left jaw pain as well.  At the time the chest pain  occurred patient was placing a jig saw puzzle together.  She denies any  exertional chest pain and denies any reoccurrence of chest pains this last  evening.  She has denied any associated of shortness of breath, nausea,  vomiting, reflux symptoms, presyncope, syncopal episodes, palpitations or  leg swelling.  Patient reports this morning she has had no further chest  pain, had some nausea upon awakening, however this resolved.  Patient denies  any family history of heart disease.  Does have a family history of stroke  and aortic aneurysm rupture.  Patient did have a cardiac evaluation in 1997  in which she underwent a stress Cardiolite which was unremarkable.   PAST MEDICAL HISTORY:  1. Asthmatic bronchitis.  2. Allergic rhinitis.  3. Hypertension.  4. Hyperlipidemia, currently on Lipitor with the most recent LDL at 100      and HDL of 57.  5. Osteoarthritis.  6. Status post hysterectomy and appendectomy.  7. Anxiety.   DRUG ALLERGIES:  SULFA.   PHYSICAL EXAMINATION:  GENERAL:  The patient is a very pleasant thin white  female in no acute distress.  VITAL SIGNS:  She is afebrile with stable vital signs. Oxygen saturation is  96% on room air. Weight is at 115.  HEENT:  Unremarkable.  NECK:  Supple without  cervical adenopathy.  No JVD.  Carotids are equal.  Upstrokes are bilaterally.  No bruits were noted.  LUNGS:  Clear to auscultation.  CARDIAC:  S1, S2 without murmur, gallops, or rubs.  ABDOMEN:  Soft with no hepatosplenomegaly.  No guarding or rebound noted.  Bowel sounds are positive throughout.  No abdominal masses or bruits are  appreciated.  EXTREMITIES:  Warm bilaterally without any calve clubbing or edema.  NEURO:  No focal deficits noted.   DATA:  EKG shows a normal sinus rhythm without any acute changes compared to  previous EKGs.   IMPRESSION/PLAN:  Atypical chest pain, questionable etiology.  The patient  does not have any acute changes on her EKG however, due to the patient's age  risk factors of hyperlipidemia we will refer her to cardiology for a cardiac  evaluation.  Patient was to continue on aspirin daily.  She was advised that  if she develops any reoccurrence of chest pain she is to contact the  emergency services to be taken to the hospital emergently.  She is  to make  an appointment with Dr. Daleen Squibb on September 13.                                   Rubye Oaks, NP                                Lonzo Cloud. Kriste Basque, MD   TP/MedQ  DD:  11/07/2005  DT:  11/08/2005  Job #:  [id number]

## 2010-07-15 NOTE — Assessment & Plan Note (Signed)
[facility]                              CARDIOLOGY OFFICE NOTE   MEADOW, ABRAMO                      MRN:          [id number]  DATE:11/22/2005                            DOB:          [birth year]    PRIMARY CARE PHYSICIAN:  Dr. [name].   PRIMARY CARDIOLOGIST:  Dr. Valera Castle.   HISTORY OF PRESENT ILLNESS:  Sherri Crawford is a very pleasant 77 year old  female patient who recently saw Dr. Daleen Squibb for complaints of chest pain and  jaw pain.  He set  her up for cardiac catheterization.  This showed a  calcified left main stem and calcified proximal LAD with 50% stenosis of the  1st diagonal region and an ostial diagonal with 50% stenosis.  She had mild  nonobstructive disease throughout the rest of her LAD as well as mild  nonobstructive disease of the circumflex and RCA.  Her EF was 65%.  The  severity of her LAD disease was not quite certain and she was set up for a  Myoview scan.  It was felt that if she had no ischemia, she could be treated  medically.  Her Myoview scan on September 21 showed no sign of scar or  ischemia.  She returns today for followup and notes that she has had no more  chest pain or shortness of breath or jaw pain.  She denies any syncope.  She  has had some postural dizziness.   CURRENT MEDICATIONS:  1. Advair 250/50 b.i.d.  2. Centrum Silver  3. Singulair 10 mg daily.  4. Triamterene/hydrochlorothiazide 37.5/25 mg daily.  5. Lipitor 10 mg daily.  6. Glucosamine.  7. Klor-Con 10 mEq daily.  8. Os-Cal 500 mg daily.  9. Allergy injection every week.  10.Celebrex 200 mg daily.  11.Nexium 40 mg daily.  12.Ginseng.  13.Boniva.  14.Aspirin daily.   ALLERGIES:  SULFA   PHYSICAL EXAMINATION:  She is a well-nourished, well-developed elderly  female in no acute distress.  Blood pressure sitting 137/82 with a pulse of 70.  Standing 134/74 with a  pulse of 72.  After 2 minutes 127/75 with the pulse 73.  After 5  minutes  140/71 with a pulse of 69.  HEENT:  Unremarkable.  NECK:  Without JVD.  CARDIAC:  Normal S1, S2.  Regular rate and rhythm.  LUNGS:  Are clear to auscultation bilaterally without wheezing, rhonchi, or  rales.  ABDOMEN: Soft, nontender with normoactive bowel sounds.  No organomegaly.  EXTREMITIES:  Without edema.  Calves are soft, nontender.  SKIN:  Warm and dry.  NEUROLOGIC:  She is alert and oriented x3.  Cranial nerves II through XII  are grossly intact.  Right groin without hematomas or bruits.   Electrocardiogram reveals sinus rhythm with heart rate of 68, normal axis,  no acute changes.   IMPRESSION:  1. Moderate nonobstructive coronary disease.      a.     Nonischemic Myoview.  2. Good left ventricular function.  3. Hypertension.  4. Treated dyslipidemia.  5. Dizziness.      a.  Positive orthostasis by orthostatic vital signs today.  6. Asthma.   PLAN:  The patient presents in the office today for post catheterization  followup.  She is doing well without any recurrent chest pain or jaw pain.  She does have some orthostatic blood pressure drop when she stands.  However, given her recent finding of coronary disease, we would like to keep  her blood pressure under control as much as possible.  I have asked her to  make sure she hydrates herself well as well as observe good hygiene when  going from a lying to a standing position.  I have told her to pump her calf  muscles before standing.  I have also asked her to try to get compression  hose.  If these conservative measures do not alleviate some of her postural  dizziness, we may need to cut back on her medications.  Currently she is  only on Maxzide for her blood pressure.  She will continue to follow up with  Dr. Kriste Basque for her blood pressure as well as her hyperlipidemia.  Now that  she has documented coronary disease, her goal low-density lipoprotein is  less than 70.  I will have her follow up with Dr. Daleen Squibb  in the next 3 months.      ______________________________  Tereso Newcomer, PA-C    ______________________________  Jesse Sans. Daleen Squibb, MD, [facility]    SW/MedQ  DD:  11/22/2005  DT:  11/24/2005  Job #:  [id number]   cc:   Lonzo Cloud. Kriste Basque, MD

## 2010-07-15 NOTE — Cardiovascular Report (Signed)
NAME:  Sherri Crawford, Sherri Crawford               ACCOUNT NO.:  [id number]   MEDICAL RECORD NO.:  [id number]          PATIENT TYPE:  OIB   LOCATION:  1962                         FACILITY:  [facility]   PHYSICIAN:  Veverly Fells. Excell Seltzer, MD  DATE OF BIRTH:  10/14/1920   DATE OF PROCEDURE:  11/14/2005  DATE OF DISCHARGE:                              CARDIAC CATHETERIZATION   OPERATION/PROCEDURE:  1. Left heart catheterization.  2. Selective coronary angiogram.  3. Left ventricular angiogram.  4. Abdominal aortogram.   INDICATIONS:  Mrs. Aspinwall is a very pleasant 77 year old woman who has  multiple cardiovascular risk factors and a history of that is concerning for  angina.  She has experienced multiple episodes of jaw aching and  intermittent chest pressure.  She was subsequently referred for cardiac  catheterization.   ACCESS:  Right femoral artery, 4-French.   COMPLICATIONS:  None.   PROCEDURAL DETAILS:  The procedural risks and indications were discussed  with the patient.  Informed consent was obtained.  The right groin was  prepped, draped and anesthetized with 1% lidocaine.  Using the modified  Seldinger technique, a 4-French arterial sheath was placed on the right  common femoral artery which was very heavily calcified.  There was a mild  degree of difficulty in passing a wire up to the aorta but I was able to  pass the J-tip wire up to the proximal aorta.  There was heavy calcification  in the iliac arteries.  Multiple angiographic views were taken above the  left and right coronary arteries.  The left coronary artery was engaged  using a 4-French JL-4 catheter.  The right coronary artery was engaged using  a 4-French 3DRC catheter.  Following selective coronary angiography, a 4-  French pigtail was inserted into the left ventricle.  Opening left  ventricular pressures were recorded.  A left ventriculogram was performed.  Following left ventriculography, a pull-back was performed across the  aortic  valve.  Following pull-back, the pigtail catheter was brought into the  abdominal aorta and an abdominal aortogram was performed.  All catheter  exchanges were performed over a wire.  At the conclusion of the case, the 4-  [place] sheath was pulled and manual pressure was used for hemostasis.   FINDINGS:  Aortic pressure was 171/71 with a mean of 119.  Left ventricular  pressure was 174/10 with an end-diastolic pressure of 20.  There was no  gradient across the aortic valve.   CORONARY ANGIOGRAPHY:  The left main stem is heavily calcified without  significant obstructive disease.  It bifurcates into the LAD and left  circumflex.   The proximal LAD has heavy calcification with mild nonobstructive disease.  The LAD gives off a large diagonal branch that bifurcates.  The LAD just at  this bifurcation has 50% focal stenosis.  The ostial diagonal also has 50%  stenosis.  This area is also calcified.  The remainder of the mid and distal  LAD are angiographically without any significant disease.  There is mild  nonobstructive disease throughout.   The left circumflex:  The proximal left circumflex is  mildly calcified.  There is mild nonobstructive disease throughout the proximal and mid  circumflex.  The distal left circumflex gives off a posterolateral branch  that is medium caliber and without any significant obstructive disease.   The right coronary artery:  The right coronary artery is diffusely  calcified.  There is mild nonobstructive disease throughout the mid portion  of the right coronary artery.  Distally the right coronary artery bifurcates  into a posterior descending branch and posterior AV segment.  There are  three small posterolateral branches from the posterior AV segment.   The left ventriculogram demonstrates normal left ventricular ejection  fraction with an estimated LVEF of 65%.  There are no wall motion  abnormalities.   The distal aortogram demonstrates  normal renal arteries, normal infrarenal  aorta and calcified common iliac arteries without obstructive disease.   ASSESSMENT:  1. Moderate left anterior descending disease with a focal 50% stenosis at      the first diagonal region.  2. Nonobstructive disease in the left circumflex and right coronary      artery.  3. Normal left ventricular function.  4. Normal distal aorta.   PLAN:  Discussed the findings with Dr. Daleen Squibb.  I think that it will be  reasonable to perform an outpatient Myoview study to evaluate for LAD  ischemia.  Angiographically, this is an indeterminate lesion and if she has  significant ischemia in the mid anterior wall, I think it is reasonable to  proceed with percutaneous coronary intervention of this area.  If she has  low risk findings on her Myoview study, she could be treated medically.  Will continue her current medical therapy at this time.      Veverly Fells. Excell Seltzer, MD  Electronically Signed     MDC/MEDQ  D:  11/14/2005  T:  11/15/2005  Job:  [id number]   cc:   Thomas C. Daleen Squibb, MD, [facility]  Scott M. Kriste Basque, MD

## 2010-07-15 NOTE — Assessment & Plan Note (Signed)
[facility]                            CARDIOLOGY OFFICE NOTE   NAME:Sherri Crawford, Sherri Crawford                      MRN:          [id number]  DATE:02/13/2006                            DOB:          05-12-1920    Sherri Crawford returns today for further management of the following  issues:  1. Nonobstructive coronary artery disease.  2. Nonischemic Myoview.  3. Good LV function.  4. Hypertension.  5. Treated hyperlipidemia.   Since we saw her on follow up November 22, 2005 she continues to have  some chest pressure. This is mostly at night when she lies down. She  denies any dysphagia but does have some mild reflux symptoms. She is on  Nexium 40 mg a day. This is clearly not exertion related. She is  followed closely by Dr. Kriste Basque who is monitoring her blood work and will  see her the first of the year.   Her medications are unchanged since last visit. Please refer to the  chart.   Her blood pressure today is 130/78, her pulse is 68 and regular. Weight  is 115. She is in no acute distress. Skin is warm and dry. Carotid  upstrokes are equal bilaterally without bruits.  No JVD. Thyroid is not  enlarged. Trachea is in the midline. Lungs are clear without rub. PMI is  nondisplaced. She has  normal S1, S2  without murmur, rub, or gallop.  Abdominal exam is soft. There is no epigastric tenderness. There is no  hepatomegaly or hepatic tenderness. Extremities reveal no clubbing,  cyanosis, or edema. Pulses are intact. Varicose veins are present. Skin  is intact except for a few ecchymoses. Musculoskeletal is intact.   Sherri Dispenza's chest discomfort sounds mostly gastroesophageal. I have  recommended she continue on the Nexium and perhaps follow up with Dr.  Kriste Basque concerning this. I have also recommended enteric coated aspirin 81  mg a day. I will plan on seeing her back in a year.     Thomas C. Daleen Squibb, MD, [facility]  Electronically Signed    TCW/MedQ  DD:  02/13/2006  DT: 02/13/2006  Job #: [id number]   cc:   Lonzo Cloud. Kriste Basque, MD

## 2010-07-26 NOTE — H&P (Signed)
NAME:  Sherri Crawford, Sherri Crawford               ACCOUNT NO.:  [id number]  MEDICAL RECORD NO.:  [id number]           PATIENT TYPE:  E  LOCATION:  [facility]                         FACILITY:  [facility]  PHYSICIAN:  Michiel Cowboy, MDDATE OF BIRTH:  1920-03-05  DATE OF ADMISSION:  07/02/2010 DATE OF DISCHARGE:                             HISTORY & PHYSICAL   PRIMARY CARE PHYSICIAN:  Lonzo Cloud. Kriste Basque, MD  CHIEF COMPLAINT:  Left leg swelling.  The patient is an 77 year old female residing at a friend's home.  Since yesterday, the patient noticed that her left leg was more swollen than her right.  She did not have any history of recent travel.  She exercises twice a day.  She has not had any weight loss.  No fevers, no chills.  No chest pain, shortness of breath, nausea, vomiting, or diarrhea.  Otherwise, review of systems completely negative.  She complained about this to the nurse at her facility who recommended further workup.  A Doppler was done that showed left lower extremity DVT, at which point, she would present to the emergency department.  The DVT was fairly large; and given her advanced age, it was decided to be admitted.  [facility] was called for an admission.  REVIEW OF SYSTEMS:  Otherwise negative except for HPI.  PAST MEDICAL HISTORY:  Significant for: 1. Asthma. 2. Anxiety. 3. Chronic back pain. 4. Bronchitis. 5. COPD. 6. Coronary artery disease. 7. Degenerative joint disease. 8. Diverticulosis. 9. GERD. 10.Hiatal hernia. 11.Hypercholesteremia. 12.Macular degeneration. 13.History of TIA. 14.Hypertension. 15.The patient had had a colonoscopy in 2008 that showed a small     polyp, but no malignancy, not sure about the mammogram.  SOCIAL HISTORY:  The patient lives with spouse at friend's home.  Does not smoke, drink, or abuse drugs.  FAMILY HISTORY:  Noncontributory.  ALLERGIES:  SULFA.  MEDICATIONS: 1. Vitamin D3 1000 units daily. 2. ProAir inhaler 1-2 puffs  q.4 h. as needed. 3. Nitroglycerin as needed. 4. Nexium 40 daily. 5. Multivitamins. 6. Lyrica 25 mg twice daily. 7. Lipitor 10 mg daily. 8. Metoprolol 25 mg twice daily. 9. Celebrex 200 mg daily. 10.Hydroxyurea 500 mg daily. 11.Boniva 150 monthly. 12.Aspirin 81 mg daily. 13.Advair 250/50 one puff twice daily.  PHYSICAL EXAMINATION:  VITAL SIGNS:  Temperature 98.1, blood pressure 148/49, pulse 94, respirations 18, satting 98% on room air. GENERAL:  The patient appears to be in no acute distress, pleasant elderly female. HEENT:  Head nontraumatic.  Moist mucous membranes. LUNGS:  Clear to auscultation bilaterally. HEART:  Regular rate and rhythm.  No murmurs appreciated. LOWER EXTREMITIES:  Left lower extremity significant for swelling and warmth with prominent veins. ABDOMEN:  Soft, nontender. NEUROLOGIC:  Grossly intact.  LABORATORY DATA:  White blood cell count 7.8, hemoglobin 11.4.  Sodium 137, potassium 4.2, creatinine 0.87.  DVT of her left leg found per Doppler performed at an outside facility, showed extensive thrombus on the left from CFV to PTV and few abnormal veins at the ankle.  EKG showing sinus rhythm, heart rate of 81 with no ischemic changes could be appreciated and overall was unchanged from an  EKG from May 2011.  ASSESSMENT AND PLAN:  This is an 77 year old female with history of hypertension, but no known history of malignancy or anything that put her at risk of developing deep vein thrombosis of left leg.  We will admit.  The patient already received Lovenox.  We will continue for now and start her also on Coumadin.  Send hypercoagulable panel, but unfortunately she received Lovenox and they will be not completely reliable.  She will need further workup for cancerous processes.  She will at least start with chest x-ray for now.  She did have a colonoscopy.  Her primary care provider can also further workup is still needed after discussion with the patient  with possible CT scan of abdomen and pelvis if so desired and we also need to clarify when the mammogram was done.  This was a fairly large clot burden.  Could potentially consider IVC filter placement if the patient were to be agreeable.  History of hypertension.  Continue home meds of metoprolol.  History of asthma.  Continue Advair inhalers.  History of coronary artery disease, currently stable.  The patient did not have any chest pain, shortness of breath, if she develops this or PE.  Prophylaxis.  Protonix, Lovenox/Coumadin.  CODE STATUS:  The patient is to be do not resuscitate, do not intubate per her own wishes.  No family is at bedside, but she states that this was a decision made mutually.     Michiel Cowboy, MD     AVD/MEDQ  D:  07/02/2010  T:  07/02/2010  Job:  [id number]  cc:   Lonzo Cloud. Kriste Basque, MD  Electronically Signed by Therisa Doyne MD on 07/26/2010 05:59:07 AM

## 2010-07-27 ENCOUNTER — Encounter (HOSPITAL_BASED_OUTPATIENT_CLINIC_OR_DEPARTMENT_OTHER): Payer: Medicare Other | Admitting: Oncology

## 2010-07-27 ENCOUNTER — Other Ambulatory Visit: Payer: Self-pay | Admitting: Oncology

## 2010-07-27 DIAGNOSIS — Z5181 Encounter for therapeutic drug level monitoring: Secondary | ICD-10-CM

## 2010-07-27 DIAGNOSIS — D473 Essential (hemorrhagic) thrombocythemia: Secondary | ICD-10-CM

## 2010-07-27 DIAGNOSIS — Z7901 Long term (current) use of anticoagulants: Secondary | ICD-10-CM

## 2010-07-27 LAB — CBC WITH DIFFERENTIAL/PLATELET
BASO%: 0.7 % (ref 0.0–2.0)
EOS%: 2.1 % (ref 0.0–7.0)
HCT: 36.4 % (ref 34.8–46.6)
LYMPH%: 25.1 % (ref 14.0–49.7)
MCH: 37.6 pg — ABNORMAL HIGH (ref 25.1–34.0)
MCHC: 33.2 g/dL (ref 31.5–36.0)
NEUT%: 65.9 % (ref 38.4–76.8)
lymph#: 1.9 10*3/uL (ref 0.9–3.3)

## 2010-07-27 LAB — PROTIME-INR: INR: 1.9 — ABNORMAL LOW (ref 2.00–3.50)

## 2010-07-29 DIAGNOSIS — J449 Chronic obstructive pulmonary disease, unspecified: Secondary | ICD-10-CM

## 2010-07-29 DIAGNOSIS — Z5181 Encounter for therapeutic drug level monitoring: Secondary | ICD-10-CM

## 2010-07-29 DIAGNOSIS — I82409 Acute embolism and thrombosis of unspecified deep veins of unspecified lower extremity: Secondary | ICD-10-CM

## 2010-07-29 DIAGNOSIS — M545 Low back pain, unspecified: Secondary | ICD-10-CM

## 2010-07-29 DIAGNOSIS — I2510 Atherosclerotic heart disease of native coronary artery without angina pectoris: Secondary | ICD-10-CM

## 2010-07-29 DIAGNOSIS — Z7901 Long term (current) use of anticoagulants: Secondary | ICD-10-CM

## 2010-07-29 DIAGNOSIS — F411 Generalized anxiety disorder: Secondary | ICD-10-CM

## 2010-07-29 DIAGNOSIS — I10 Essential (primary) hypertension: Secondary | ICD-10-CM

## 2010-08-19 ENCOUNTER — Other Ambulatory Visit: Payer: Self-pay | Admitting: Oncology

## 2010-08-19 ENCOUNTER — Encounter (HOSPITAL_BASED_OUTPATIENT_CLINIC_OR_DEPARTMENT_OTHER): Payer: Medicare Other | Admitting: Oncology

## 2010-08-19 DIAGNOSIS — D473 Essential (hemorrhagic) thrombocythemia: Secondary | ICD-10-CM

## 2010-08-19 DIAGNOSIS — D47Z9 Other specified neoplasms of uncertain behavior of lymphoid, hematopoietic and related tissue: Secondary | ICD-10-CM

## 2010-08-19 LAB — PROTIME-INR: INR: 1.7 — ABNORMAL LOW (ref 2.00–3.50)

## 2010-08-19 LAB — MORPHOLOGY

## 2010-08-19 LAB — CBC WITH DIFFERENTIAL/PLATELET
BASO%: 0.5 % (ref 0.0–2.0)
Basophils Absolute: 0 10*3/uL (ref 0.0–0.1)
EOS%: 1.5 % (ref 0.0–7.0)
HCT: 36.1 % (ref 34.8–46.6)
HGB: 12 g/dL (ref 11.6–15.9)
MCH: 36.9 pg — ABNORMAL HIGH (ref 25.1–34.0)
MONO#: 0.4 10*3/uL (ref 0.1–0.9)
RDW: 13.6 % (ref 11.2–14.5)
WBC: 7.9 10*3/uL (ref 3.9–10.3)
lymph#: 2 10*3/uL (ref 0.9–3.3)

## 2010-09-02 ENCOUNTER — Encounter (HOSPITAL_BASED_OUTPATIENT_CLINIC_OR_DEPARTMENT_OTHER): Payer: Medicare Other | Admitting: Oncology

## 2010-09-02 ENCOUNTER — Other Ambulatory Visit: Payer: Self-pay | Admitting: Oncology

## 2010-09-02 DIAGNOSIS — J449 Chronic obstructive pulmonary disease, unspecified: Secondary | ICD-10-CM

## 2010-09-02 DIAGNOSIS — D473 Essential (hemorrhagic) thrombocythemia: Secondary | ICD-10-CM

## 2010-09-02 DIAGNOSIS — D47Z9 Other specified neoplasms of uncertain behavior of lymphoid, hematopoietic and related tissue: Secondary | ICD-10-CM

## 2010-09-02 LAB — CBC WITH DIFFERENTIAL/PLATELET
Basophils Absolute: 0 10*3/uL (ref 0.0–0.1)
Eosinophils Absolute: 0.1 10*3/uL (ref 0.0–0.5)
HGB: 12.8 g/dL (ref 11.6–15.9)
LYMPH%: 24 % (ref 14.0–49.7)
MCH: 36.9 pg — ABNORMAL HIGH (ref 25.1–34.0)
MCV: 109.4 fL — ABNORMAL HIGH (ref 79.5–101.0)
MONO%: 4.9 % (ref 0.0–14.0)
NEUT#: 4.3 10*3/uL (ref 1.5–6.5)
NEUT%: 69.7 % (ref 38.4–76.8)
Platelets: 359 10*3/uL (ref 145–400)

## 2010-09-02 LAB — COMPREHENSIVE METABOLIC PANEL
ALT: 13 U/L (ref 0–35)
BUN: 23 mg/dL (ref 6–23)
CO2: 29 mEq/L (ref 19–32)
Creatinine, Ser: 1.21 mg/dL — ABNORMAL HIGH (ref 0.50–1.10)
Glucose, Bld: 131 mg/dL — ABNORMAL HIGH (ref 70–99)
Total Bilirubin: 0.5 mg/dL (ref 0.3–1.2)

## 2010-09-02 LAB — MORPHOLOGY: PLT EST: ADEQUATE

## 2010-09-02 LAB — LACTATE DEHYDROGENASE: LDH: 202 U/L (ref 94–250)

## 2010-09-20 ENCOUNTER — Other Ambulatory Visit: Payer: Self-pay | Admitting: *Deleted

## 2010-09-20 MED ORDER — ESOMEPRAZOLE MAGNESIUM 40 MG PO CPDR: 40.0000 mg | DELAYED_RELEASE_CAPSULE | Freq: Every day | ORAL | Status: DC

## 2010-10-05 ENCOUNTER — Other Ambulatory Visit: Payer: Self-pay | Admitting: *Deleted

## 2010-10-05 MED ORDER — ATORVASTATIN CALCIUM 20 MG PO TABS: 20.0000 mg | ORAL_TABLET | Freq: Every day | ORAL | Status: DC

## 2010-10-25 DIAGNOSIS — K2289 Other specified disease of esophagus: Secondary | ICD-10-CM | POA: Insufficient documentation

## 2010-10-25 DIAGNOSIS — K228 Other specified diseases of esophagus: Secondary | ICD-10-CM | POA: Insufficient documentation

## 2010-11-02 ENCOUNTER — Other Ambulatory Visit: Payer: Self-pay | Admitting: *Deleted

## 2010-11-02 MED ORDER — METOPROLOL TARTRATE 25 MG PO TABS: 25.0000 mg | ORAL_TABLET | Freq: Two times a day (BID) | ORAL | Status: DC

## 2010-11-16 ENCOUNTER — Ambulatory Visit (INDEPENDENT_AMBULATORY_CARE_PROVIDER_SITE_OTHER): Payer: Medicare Other | Admitting: Pulmonary Disease

## 2010-11-16 ENCOUNTER — Encounter: Payer: Self-pay | Admitting: Pulmonary Disease

## 2010-11-16 DIAGNOSIS — E7800 Pure hypercholesterolemia, unspecified: Secondary | ICD-10-CM

## 2010-11-16 DIAGNOSIS — Z23 Encounter for immunization: Secondary | ICD-10-CM

## 2010-11-16 DIAGNOSIS — D473 Essential (hemorrhagic) thrombocythemia: Secondary | ICD-10-CM

## 2010-11-16 DIAGNOSIS — I2510 Atherosclerotic heart disease of native coronary artery without angina pectoris: Secondary | ICD-10-CM

## 2010-11-16 DIAGNOSIS — M1990 Unspecified osteoarthritis, unspecified site: Secondary | ICD-10-CM

## 2010-11-16 DIAGNOSIS — I82409 Acute embolism and thrombosis of unspecified deep veins of unspecified lower extremity: Secondary | ICD-10-CM

## 2010-11-16 DIAGNOSIS — M810 Age-related osteoporosis without current pathological fracture: Secondary | ICD-10-CM

## 2010-11-16 DIAGNOSIS — I10 Essential (primary) hypertension: Secondary | ICD-10-CM

## 2010-11-16 DIAGNOSIS — J449 Chronic obstructive pulmonary disease, unspecified: Secondary | ICD-10-CM

## 2010-11-16 DIAGNOSIS — K219 Gastro-esophageal reflux disease without esophagitis: Secondary | ICD-10-CM

## 2010-11-16 MED ORDER — ALBUTEROL SULFATE HFA 108 (90 BASE) MCG/ACT IN AERS: 2.0000 | INHALATION_SPRAY | Freq: Four times a day (QID) | RESPIRATORY_TRACT | Status: DC | PRN

## 2010-11-16 NOTE — Patient Instructions (Signed)
Today we updated your med list in EPIC...  We gave you an ADVAIR smaple of the 250 dose (still one inhalation twice daily)...    Try it for several weeks & see if your breathing is any better & if you don't need the Proair rescue inhaler as often...  Call for any problems or if we can be of service in any way...  Let's plan a routine follow up visit w/ FASTING blood work in 4 months or so.Marland KitchenMarland Kitchen

## 2010-11-16 NOTE — Progress Notes (Signed)
Subjective:    Patient ID: Sherri Crawford, female    DOB: Feb 10, 1921, 77 y.o.   MRN: [id number]  HPI [name] here for a follow up visit... she has mult med problems including:  Asthmatic Bronchitis;  HBP;  CAD;  Hyperchol;  GERD/ Colon polyps;  DJD/ LBP/ Osteoporosis;  ?hx TIA;  Essential Thrombocytosis on Hydroxyurea per [name]...  ~  July 30, 2009:  Hosp x2d by [facility] for what sounds like another NTG misadventure> transient syncopal spell after 1NTG for ?lower CP/ epig discomfort, then weak & had to crawl to bedroom...  Exam was unrevealing, EKG's w/o acute changes, Enz & labs= neg, 2DEcho w/ mild LVH, EF= 60-65%, no wall motion abn, gr1 DD, ?mass in RA?;  subseq TEE showed norm LV w/ EF= 60-65%, incr thickness of atrial septum c/w severe lipomatous hypertrophy (no mass), mod plaque in Ao... we discussed throwing out her NTG & using Pepcid AC, Mylanta liq, Tramadol for future episodes of discomfort... she has been following up w/ [name] on Hydroxyurea> plat count down to 600K.  ~  May 23, 2010:  9 month ROV & review> she is c/o decr appetite and weight loss assoc w/ ?3wk hx swallowing difficulty that seems intermittent & occurs most often after she has eaten 1/2 of her lunch meal "it doesn't want to go down" & has on occas vomited it back up; she takes Nexium 40mg /d; Weights on office documented 103-105 over the last 6months in 2011, & dropped from 103 to 97# from 1/12 to today (6# wt loss in 13mo);  She was instructed to incr Ensure consumption & we discussed Megace trial of appetite & refer to GI for swallowing difficulty (NOTE: prev EGD 2008 showed GERD & esophagitis, &Colon showed tub adenoma)...    AB>  Stable on Advair, Singulair, Albuterol prn;  No recent exac- doing satis from the pulm standpoint...    HBP/ CAD>  On ASA & only takes 25mg  Metoprolol if BP>150 (this is how she likes to do it);  She had 2 NTG reactions & this was discontinued 5/11;  BP today = 100/50 w/ her wt loss,  no angina etc;  Last saw [name] 1/12> stable, no changes, f/u 81yr...    Chol>  On Lip20 now & last FLP 4/11 on Lip10 reviewed;  She is not fasting today so we can't check FLP, wonder if we even need it now w/ her wt loss, but in view of her signif coronary & Ao calcif & ?prev TIA> rec continue Lip20 for now & f/u FLP when able...    GI>  See above...    DJD/ LBP/ Osteop> followed by [name] on Lyrica, Hydrocodone, Celebrex prn;  Given last epid steroid shot 1/12, mild benefit;  NOTE: today's CXR shows new ant wedging T8 (~40%), last BMD 2008 showed TScore +0.4 in spine (it is not in [employer] EMR)...    Heme>  Followed by [name] w/ Essential Thrombocytosis on Hydroxyurea (last note 1/12 reviewed- well controlled on 500mg /d x 1000mg  on Mondays w/ plats betw 218-365K)...  ~  Jul 13, 2010:  13mo ROV & post hosp check> GI eval 4/12 DrGessner showed abn Ba Esophagram w/ diffuse motility disorder & narrowing of distal esoph, plus laryngeal penetration; subseq EGD by [name] showed a presbyesoph & spasm in the distal esoph w/o stricture, Savory dilatation done, mult sm gastric polyps noted...  She was Adm 5/4 - 07/03/10 w/ left leg DVT, unprovoked (hypercoag panel- pending), w/ hx underlying  essential thrombocytosis on Hydroxyurea from [name];  Started on Coumadin w/ Lovenox bridge & early disch w/ protimes via visiting nurse & called to [name] who already checks CBC monthly...  Feeling better since the hosp, denies CP/ palpit/ SOB, & swelling in left leg is diminished- just mild now & she knows to elim salt, elevate leg, wear support hose...  ~  November 16, 2010:  57mo ROV & she continues stable w/ severe underlying multisys pathology as noted>>    COPD/ AB> She was seen by DrESL 8/12 & he stopped her Singulair & decreased her Advair to 100/50 Bid, but since then she has had to increase her Proair use to 3-4 times daily;     Cards> HBP, CAD, Hx DVT> on Metoprolol 25mg Bid & BP= 126/82; she  reports feeling ok & denies CP, palpit, ch in SOB, edema, etc; she also remains on Coumadin with Protimes monitored in DrGranfortunas office; she is not sure why she is here today...    CHOL> on Lip20 but he last FLP was over 16yr ago & she forgets to come fasting for f/u lab here, we discussed this again, see below...    GI> GERD, Polyps> on Nexium 40mg /d + Ensure & Megace for appetite & wt gain- now at 105#; followed by DrGessner for GI> his notes are reviewed...    DJD, Osteoporosis> on calcium, MVI, Vit D 1000 u daily; she has mod diffuse DJD & uses Tylenol as needed...    Thrombocythemia> she is followed by [name] on Hydrea & his note of 7/12 is reviewed w/ platelets counts betw 275-359...          Problem List:  MACULAR DEGENERATION (ICD-362.50) - sees [name] for follow up- on Vitamin therapy...  ASTHMATIC BRONCHITIS & COPD - she was stable on Advair250 + Singulair10 w/ Prn albuterol, but 8/12 DrESL stopped her Singulair & cut the ADVAIR to 100/50Bid; since then she reports that she's had to increase the Proair use to 3-4 times daily but she does not want to go back up on the Advair dose;  she still takes allergy shots every 2weeks at [place].  HYPERTENSION,  CORONARY ARTERY DISEASE,  RBBB - on METOPROLOL 25mg  Bid & off ASA mow that she's on Coumadin... she has known non-obstructive CAD and followed by [name]... she was prev on Dyazide but stopped this med... ~  Nuclear Stress Test 9/09 was normal- no infarct, no ischemia, EF= 74%... ~  3/11 & 5/11 >> 2 episodes of NTG reactions (see above) therefore NTG discontinued... ~  3/12:  BP= 110/70 & she takes METOPROLOL 25mg  if her BP is > 150 (this is how she likes to do it); denies HA, fatigue, visual changes, recurrent CP, palipit, dizziness, syncope, dyspnea, edema, etc... ~  5/12 Hosp w/ left leg DVT> CXR showed norm heart size, clear lungs x atx at bases, old T8 compression;  EKG showed SBrady, RBBB; Coumadin started & ASA  discontinued. ~  5/12:  BP= 118/60 & she is essent asymptomatic now...  DVT>  On Coumadin started 5/12 Hosp & protimes monitored by [name]...  HYPERCHOLESTEROLEMIA - on diet + LIPITOR 20mg /d now... tolerated well...  ~  FLP 1/08 showed TChol 198, TG 129, HDL 59, LDL 113... ~  FLP 5/09 showed TChol 168, TG 100, HDL 49, LDL 99... ~  FLP 4/10 on Lip10 showed TChol 183, TG 117, HDL 50, LDL 109... rec> same med, better diet. ~  FLP 4/11 on Lip10 showed TChol 223, Tg 87, HDL  62, LDL 152... rec incr Lip20. ~  She is reminded to ret FASTING to f/u on her FLP...  GERD - on NEXIUM 40mg /d... ~  [name] did an EGD 4/08 showing GERD, esophagitis and Rx w/ NEXIUM 40/d (it helps)... ~  EGD 4/12 by [name] showed a presbyesoph & spasm in the distal esoph w/o stricture, Savory dilatation done, mult sm gastric polyps noted...    COLONIC POLYPS - last colonoscopy was 4/08 by [name] showing 3mm polyp= tubular adenoma...   DEGENERATIVE JOINT DISEASE (ICD-715.90) BACK PAIN, LUMBAR - she has signif Back Pain and is followed in the pain clinic ([name] now & he has her on LORCET-HD 10/500 taking 1Qid); off prev Lyrica, Celebrex, Tramadol...  OSTEOPOROSIS (ICD-733.00) - on BONIVA 150mg /mo + Calcium & Vitamins... ~  BMD here 5/01 showed TScores -0.1 in Spine, & -1.4 in left FemNeck ~  BMD here 7/08 showed TScores +0.4 in Spine, & -1.6 in left FemNeck...  ~  labs 4/10 showed Vit D level = 34... rec> take Vit D 1000 u OTC daily. ~  9/12:  Her Boniva was stopped in the interim & she is not sure when (likely during her hosp)> rec to continue calcium, MVI, Vit D & recheck BMD.  TRANSIENT ISCHEMIC ATTACKS, HX OF - she has sm vessel disease on prev scans... Prev on 81mg  ECASA daily but this was stopped when she started on the Coumadin for DVT. ~  3/11: went to ER w/ episode of CP, s/p 3 NTG, developed left arm weakness, no speech or leg symptoms... she was off ASA for several days in anticipation of shot from  [name]... ? NTG related, ?off ASA related, ?need for Plavix... decision made to stay on ASA, limit NTG.  ANXIETY (ICD-300.00) - not currently on medication...  ESSENTIAL THROMBOCYTHEMIA (ICD-238.71) - noted to have rising platelet count & referred to [facility] 4/11 w/ Dx of Essential Thrombocytosis/ myeloproliferative disorder & started on HYDROXYUREA 500mg /d w/ titration from DrG... ~  labs 4/11 showed platlet count = 994K... Hydroxyurea started by Heme. ~  labs 5/11 showed platelet count = 602K ~  Labs 3/12 showed Hg= 12.2 (MCV=118), WBC= 5.7, Plat=294K ~  Labs in hosp 5/12 showed Hg down to 10.7, MCV= 116... ~  [name] continues to monitor labs & protimes monthly thru his clinic...   Past Surgical History  Procedure Date  . Appendectomy   . Cholecystectomy   . Tah and bso 08/21/1995    Outpatient Encounter Prescriptions as of 11/16/2010  Medication Sig Dispense Refill  . albuterol (PROAIR HFA) 108 (90 BASE) MCG/ACT inhaler Inhale 2 puffs into the lungs every 6 (six) hours as needed. As needed for wheezing       . atorvastatin (LIPITOR) 20 MG tablet Take 1 tablet (20 mg total) by mouth daily.  30 tablet  5  . Cholecalciferol (VITAMIN D3) 1000 UNITS CAPS Take 1 capsule by mouth daily.        Marland Kitchen esomeprazole (NEXIUM) 40 MG capsule Take 1 capsule (40 mg total) by mouth daily before breakfast.  30 capsule  11  . Fluticasone-Salmeterol (ADVAIR DISKUS) 250-50 MCG/DOSE AEPB Inhale 1 puff into the lungs every 12 (twelve) hours. Rinse mouth after each use       . hydrocodone-acetaminophen (LORCET-HD) 5-500 MG per capsule Take 1 capsule by mouth every 8 (eight) hours as needed.        . hydroxyurea (HYDREA) 500 MG capsule Take two tabs on Monday and one every day for the rest  of the week...Marland KitchenMarland KitchenMay take with food to minimize GI side effects per Dr. Cyndie Chime      . megestrol (MEGACE) 40 MG/ML suspension Take by mouth Twice daily.      . metoprolol tartrate (LOPRESSOR) 25 MG tablet Take 1  tablet (25 mg total) by mouth 2 (two) times daily. As directed for heart and BP  60 tablet  5  . montelukast (SINGULAIR) 10 MG tablet Take 10 mg by mouth at bedtime.        . Multiple Vitamin (MULTIVITAMIN PO) Take 1 tablet by mouth daily.        . nitroGLYCERIN (NITROSTAT) 0.4 MG SL tablet Place 0.4 mg under the tongue every 5 (five) minutes as needed.        . Nutritional Supplements (ENSURE ENLIVE) LIQD Take 1 Can by mouth daily.       . pregabalin (LYRICA) 25 MG capsule Take 25 mg by mouth 2 (two) times daily.        Marland Kitchen warfarin (COUMADIN) 3 MG tablet Take as directed         Allergies  Allergen Reactions  . Sulfonamide Derivatives     Pt is unsure of reaction    Review of Systems        See HPI - all other systems neg except as noted... The patient complains of decreased hearing and dyspnea on exertion.  The patient denies anorexia, fever, weight loss, weight gain, vision loss, hoarseness, chest pain, syncope, peripheral edema, prolonged cough, headaches, hemoptysis, abdominal pain, melena, hematochezia, severe indigestion/heartburn, hematuria, incontinence, muscle weakness, suspicious skin lesions, transient blindness, difficulty walking, depression, unusual weight change, abnormal bleeding, enlarged lymph nodes, and angioedema.     Objective:   Physical Exam     WD, Thin, [name] in NAD... GENERAL:  Alert & oriented; pleasant & cooperative... HEENT:  [place]vin/AT, EOM-wnl, PERRLA, EACs-clear, TMs-wnl, NOSE-clear, THROAT-clear & wnl. NECK:  Supple w/ fairROM; no JVD; normal carotid impulses w/o bruits; no thyromegaly or nodules palpated; no lymphadenopathy. CHEST:  Decr BS bilat & clear to P & A; without wheezes/ rales/ or rhonchi heard... HEART:  Regular Rhythm; without murmurs/ rubs/ or gallops detected...  ABDOMEN:  Thin, soft & nontender; normal bowel sounds; no organomegaly or masses palpated... EXT: without deformities, mild arthritic changes; no varicose veins/ +venous insuffic/  no edema. BACK:  kyphosis without focal tenderness... NEURO:  CN's intact; no focal neuro deficits; gait abn due to LBP... DERM:  No lesions noted; no rash etc...   Assessment & Plan:   DVT>  She is on Coumadin w/ protimes by visiting nurses & called to [name] who will be monitoring this going forward since he already monitors her Hydroxyurea for the thrombocytosis...  Essential Thrombocytosis>  As above, stable on Hydroxyurea, monitored by DrG...  COPD>  She has noted increase Proair use since DrESL cut out her Singulair & decreased her Advair; we will monitor her breathing closely & she will call prn problems...  HBP/ CAD>  Stable on Metoprolol, no angina etc...  CHOL>  Needs to ret for FLP on the Lip20...  DJD/ Osteoporosis>  She will need f/u BMD when able for f/u of her TScores...  Other medical problems as noted.Marland KitchenMarland Kitchen

## 2010-11-24 ENCOUNTER — Encounter: Payer: Self-pay | Admitting: Pulmonary Disease

## 2011-01-02 ENCOUNTER — Ambulatory Visit: Payer: Self-pay | Admitting: Oncology

## 2011-01-02 DIAGNOSIS — I82409 Acute embolism and thrombosis of unspecified deep veins of unspecified lower extremity: Secondary | ICD-10-CM

## 2011-01-02 LAB — POCT INR: INR: 3.2

## 2011-01-04 ENCOUNTER — Other Ambulatory Visit: Payer: Self-pay | Admitting: Pulmonary Disease

## 2011-01-09 ENCOUNTER — Telehealth: Payer: Self-pay | Admitting: Pharmacist

## 2011-01-09 ENCOUNTER — Ambulatory Visit (HOSPITAL_BASED_OUTPATIENT_CLINIC_OR_DEPARTMENT_OTHER): Payer: Self-pay | Admitting: Oncology

## 2011-01-09 DIAGNOSIS — I82409 Acute embolism and thrombosis of unspecified deep veins of unspecified lower extremity: Secondary | ICD-10-CM

## 2011-01-09 NOTE — Progress Notes (Signed)
Pt reports no problems. Plan to cont. Same dose communicated by fax to [facility] Marily Lente, Pharm.D.

## 2011-01-16 ENCOUNTER — Ambulatory Visit: Payer: Self-pay | Admitting: Oncology

## 2011-01-16 DIAGNOSIS — I82409 Acute embolism and thrombosis of unspecified deep veins of unspecified lower extremity: Secondary | ICD-10-CM

## 2011-01-16 LAB — POCT INR: INR: 3

## 2011-01-16 NOTE — Progress Notes (Signed)
No complaints.  INR at upper level of therapeutic range.  Will continue current dose for now, and recheck INR in 1 week with [facility]  If her INR continues to increase, pt will likely need a dose reduction.  Pt is not a reliable historian, so left msg with Levy Sjogren, RN at [facility] to call back for [place] to confirm her dose and compliance.  She will load Mrs. Haggar's pill box for the next week following receipt of our instructions.  Update: Spoke with Levy Sjogren, RN - she confirms dose of Coumadin = 3mg  daily.  She has been filling her pill boxes herself, and verified that pt missed 1 dose of Coumadin this past week.  If the pill box method fails again next week, Corey Harold plans to have a RN directly observe all future doses.  Instructions faxed to [facility].

## 2011-01-23 ENCOUNTER — Ambulatory Visit: Payer: Self-pay | Admitting: Pharmacist

## 2011-01-23 ENCOUNTER — Telehealth: Payer: Self-pay | Admitting: Pulmonary Disease

## 2011-01-23 DIAGNOSIS — I82409 Acute embolism and thrombosis of unspecified deep veins of unspecified lower extremity: Secondary | ICD-10-CM

## 2011-01-23 LAB — POCT INR: INR: 3.1

## 2011-01-23 NOTE — Telephone Encounter (Signed)
I spoke with Sherri Crawford and she stated that pt is stating she has to go have labs done tomorrow at the cancer center that Dr. Cyndie Chime had ordered. She states pt is requesting to have the lab drawn at [facility]. I advised Sherri Crawford if Dr. Cyndie Chime is the one ordering the labs then pt will need to contact his office to have this done. Sherri Crawford states she will make pt aware of this. Nothing further was needed

## 2011-01-23 NOTE — Progress Notes (Signed)
Corey Harold, RN will set up patient's pill box.

## 2011-01-23 NOTE — Telephone Encounter (Signed)
PT WANTS DR NADEL'S NURSE TO CALL "SANTANNA", THE NURSE AT [facility] RE: LABS THAT ARE TO BE DONE AT CANCER CENTER. PT WANTS LABS DONE "THERE" AND SAYS THAT THE NURSE HAS TOLD HER SHE COULD DO THIS. PT WAS ADVISED TO CALL [facility] BUT  SHE SAYS THAT SHE WANTS SN'S NURSE TO CALL AND ADVISE. SANTANNA'S # IS [id number]. PT # IS [id number]. Sherri Crawford

## 2011-01-23 NOTE — Patient Instructions (Signed)
Continue 3mg  daily. Recheck INR in 1 week.

## 2011-01-24 ENCOUNTER — Other Ambulatory Visit (HOSPITAL_BASED_OUTPATIENT_CLINIC_OR_DEPARTMENT_OTHER): Payer: Medicare Other | Admitting: Lab

## 2011-01-24 ENCOUNTER — Telehealth: Payer: Self-pay | Admitting: Pulmonary Disease

## 2011-01-24 ENCOUNTER — Other Ambulatory Visit: Payer: Self-pay | Admitting: Oncology

## 2011-01-24 DIAGNOSIS — D47Z9 Other specified neoplasms of uncertain behavior of lymphoid, hematopoietic and related tissue: Secondary | ICD-10-CM

## 2011-01-24 DIAGNOSIS — D473 Essential (hemorrhagic) thrombocythemia: Secondary | ICD-10-CM

## 2011-01-24 DIAGNOSIS — J449 Chronic obstructive pulmonary disease, unspecified: Secondary | ICD-10-CM

## 2011-01-24 LAB — CBC WITH DIFFERENTIAL/PLATELET
BASO%: 0.5 % (ref 0.0–2.0)
EOS%: 2.4 % (ref 0.0–7.0)
MCH: 37.4 pg — ABNORMAL HIGH (ref 25.1–34.0)
MCHC: 33.6 g/dL (ref 31.5–36.0)
RDW: 13.2 % (ref 11.2–14.5)
lymph#: 1.7 10*3/uL (ref 0.9–3.3)

## 2011-01-24 NOTE — Telephone Encounter (Signed)
Called and spoke with pt--she was confused and thought that SN wanted her to have labs done but then realized that it was Dr. Cyndie Chime at the cancer center.  Pt was given the number to the cancer center and she will call over there to find out about having labs done.

## 2011-01-26 ENCOUNTER — Telehealth: Payer: Self-pay

## 2011-01-26 NOTE — Telephone Encounter (Signed)
Pt notified by phone per Dr Cyndie Chime - "labs good; stay on same dose of Hydrea."  Our records indicate pt is currently taking Hydrea 1000mg  on Monday, & 500mg  all other days.  Pt does not know current dose, stating "I will have to check with the nurse."  Verbalizes understanding of no change at this time. dph

## 2011-01-30 ENCOUNTER — Ambulatory Visit: Payer: Self-pay | Admitting: Oncology

## 2011-01-30 DIAGNOSIS — I82409 Acute embolism and thrombosis of unspecified deep veins of unspecified lower extremity: Secondary | ICD-10-CM

## 2011-01-30 LAB — POCT INR: INR: 2.9

## 2011-01-30 NOTE — Progress Notes (Signed)
No complaints as per the patient.  No problems to report regarding compliance or bleeding/bruising, which was confirmed by Sherri Crawford at [facility].   INR therapeutic. Continue current dose.  Recheck INR in 1 week.

## 2011-02-06 ENCOUNTER — Ambulatory Visit (HOSPITAL_BASED_OUTPATIENT_CLINIC_OR_DEPARTMENT_OTHER): Payer: Self-pay | Admitting: Pharmacist

## 2011-02-06 DIAGNOSIS — I82409 Acute embolism and thrombosis of unspecified deep veins of unspecified lower extremity: Secondary | ICD-10-CM

## 2011-02-06 LAB — POCT INR: INR: 2.1

## 2011-02-06 NOTE — Progress Notes (Signed)
Spoke w/ pt over phone.  She is doing fine this week.  No complaints re: anticoagulation. I have also talked w/ Levy Sjogren, RN who knows pt will keep same dose & repeat protime in 2 wks (on 02/22/11). Marily Lente, Pharm.D.

## 2011-02-22 ENCOUNTER — Other Ambulatory Visit: Payer: Medicare Other | Admitting: Lab

## 2011-02-22 ENCOUNTER — Telehealth: Payer: Self-pay | Admitting: *Deleted

## 2011-02-22 ENCOUNTER — Ambulatory Visit (HOSPITAL_BASED_OUTPATIENT_CLINIC_OR_DEPARTMENT_OTHER): Payer: Medicare Other | Admitting: Oncology

## 2011-02-22 DIAGNOSIS — I82409 Acute embolism and thrombosis of unspecified deep veins of unspecified lower extremity: Secondary | ICD-10-CM

## 2011-02-22 NOTE — Telephone Encounter (Signed)
Received call from pt. reporting that she received automated call regarding appt. today & thinks her nurse will do this at [facility].

## 2011-02-22 NOTE — Progress Notes (Signed)
Hold Coumadin today. Resume 3mg  daily on 02/23/11. Recheck INR in 1 wk at [facility] on Jan 3. Corey Harold will be off on 03/01/11).  Corey Harold will set up pill box with patient today.

## 2011-02-25 ENCOUNTER — Telehealth: Payer: Self-pay | Admitting: Oncology

## 2011-02-25 NOTE — Telephone Encounter (Signed)
S/w pt, gave appt 03/28/11 @ 11am.

## 2011-03-02 ENCOUNTER — Ambulatory Visit: Payer: Self-pay | Admitting: Oncology

## 2011-03-02 DIAGNOSIS — I82409 Acute embolism and thrombosis of unspecified deep veins of unspecified lower extremity: Secondary | ICD-10-CM

## 2011-03-02 LAB — POCT INR: INR: 3

## 2011-03-03 ENCOUNTER — Telehealth: Payer: Self-pay | Admitting: *Deleted

## 2011-03-03 NOTE — Progress Notes (Signed)
Received phone call from [facility] today (03/03/11) from [place], [place].  She was checking on pts dose of Coumadin. I left vm (ph# [id number]) for Corey Harold to continue 3 mg/day.  Repeat protime on 03/09/11. Marily Lente, Pharm.D.

## 2011-03-03 NOTE — Telephone Encounter (Signed)
Received vm call from [facility]/Santana stating she faxed PT/INR results yest. & didn't hear back from anyone.  Returned call & left vm for pt to stay on same dose of coumadin 3mg /d & to repeat PT/INR in 1 wk per coumadin clinic note.

## 2011-03-07 ENCOUNTER — Encounter: Payer: Self-pay | Admitting: Oncology

## 2011-03-07 ENCOUNTER — Ambulatory Visit: Payer: Self-pay | Admitting: Oncology

## 2011-03-07 ENCOUNTER — Other Ambulatory Visit: Payer: Self-pay | Admitting: Oncology

## 2011-03-07 DIAGNOSIS — I82409 Acute embolism and thrombosis of unspecified deep veins of unspecified lower extremity: Secondary | ICD-10-CM

## 2011-03-07 LAB — POCT INR: INR: 2.8

## 2011-03-07 NOTE — Progress Notes (Unsigned)
INR today = 2.8 on 3mg  daily. No complaints per Mrs. Ricke.  Doing well.  Denies bleeding, bruising, missed doses or extra doses.  Will continue current dose and recheck INR at [facility] in 1 week.   Form faxed back to [facility] with these instructions. VM left for [place], [place] with these instructions as well.  She will ensure that patient's pill box is filled properly for the week.

## 2011-03-08 ENCOUNTER — Encounter: Payer: Self-pay | Admitting: *Deleted

## 2011-03-08 NOTE — Progress Notes (Signed)
Notified pt per Dr. Cyndie Chime to stay on same hydrea dose = 1000mg  on Mondays & 500mg  other days.  She was also reminded of appts next week.

## 2011-03-14 ENCOUNTER — Ambulatory Visit: Payer: Self-pay | Admitting: Oncology

## 2011-03-14 DIAGNOSIS — I82409 Acute embolism and thrombosis of unspecified deep veins of unspecified lower extremity: Secondary | ICD-10-CM

## 2011-03-14 LAB — POCT INR: INR: 2.3

## 2011-03-16 ENCOUNTER — Ambulatory Visit (INDEPENDENT_AMBULATORY_CARE_PROVIDER_SITE_OTHER): Payer: Medicare Other | Admitting: Cardiology

## 2011-03-16 ENCOUNTER — Encounter: Payer: Self-pay | Admitting: Cardiology

## 2011-03-16 VITALS — BP 112/68 | HR 76 | Wt 103.0 lb

## 2011-03-16 DIAGNOSIS — I10 Essential (primary) hypertension: Secondary | ICD-10-CM

## 2011-03-16 DIAGNOSIS — R609 Edema, unspecified: Secondary | ICD-10-CM

## 2011-03-16 DIAGNOSIS — I82402 Acute embolism and thrombosis of unspecified deep veins of left lower extremity: Secondary | ICD-10-CM

## 2011-03-16 DIAGNOSIS — E7800 Pure hypercholesterolemia, unspecified: Secondary | ICD-10-CM

## 2011-03-16 DIAGNOSIS — K219 Gastro-esophageal reflux disease without esophagitis: Secondary | ICD-10-CM

## 2011-03-16 DIAGNOSIS — R600 Localized edema: Secondary | ICD-10-CM

## 2011-03-16 DIAGNOSIS — I2510 Atherosclerotic heart disease of native coronary artery without angina pectoris: Secondary | ICD-10-CM

## 2011-03-16 DIAGNOSIS — Z8679 Personal history of other diseases of the circulatory system: Secondary | ICD-10-CM

## 2011-03-16 DIAGNOSIS — D473 Essential (hemorrhagic) thrombocythemia: Secondary | ICD-10-CM

## 2011-03-16 DIAGNOSIS — I82409 Acute embolism and thrombosis of unspecified deep veins of unspecified lower extremity: Secondary | ICD-10-CM

## 2011-03-16 NOTE — Patient Instructions (Signed)
Your physician wants you to follow-up in: 1 year with dr wall You will receive a reminder letter in the mail two months in advance. If you don't receive a letter, please call our office to schedule the follow-up appointment.  Your physician recommends that you continue on your current medications as directed. Please refer to the Current Medication list given to you today.

## 2011-03-16 NOTE — Progress Notes (Signed)
HPI Sherri Crawford comes in today for evaluation and management of her history of coronary artery disease. She is on Coumadin for history of a DVT and primary thrombocythemia.  She's having no angina, palpitations, presyncope, has had no falls. She denies any bleeding. Is very compliant with medications. She lives at [facility].  Past Medical History  Diagnosis Date  . Macular degeneration   . COPD (chronic obstructive pulmonary disease)   . Hypertension   . Coronary artery disease   . Hypercholesterolemia   . GERD (gastroesophageal reflux disease)   . Asthmatic bronchitis   . History of colonic polyps   . DJD (degenerative joint disease)   . Lumbar back pain   . Osteoporosis   . History of transient ischemic attack (TIA)   . Anxiety   . Essential thrombocythemia     Current Outpatient Prescriptions  Medication Sig Dispense Refill  . albuterol (PROAIR HFA) 108 (90 BASE) MCG/ACT inhaler Inhale 2 puffs into the lungs every 6 (six) hours as needed for wheezing. As needed for wheezing  1 Inhaler  11  . atorvastatin (LIPITOR) 20 MG tablet Take 1 tablet (20 mg total) by mouth daily.  30 tablet  5  . Cholecalciferol (VITAMIN D3) 1000 UNITS CAPS Take 1 capsule by mouth daily.        Marland Kitchen EPIPEN 2-PAK 0.3 MG/0.3ML DEVI as needed.      Marland Kitchen esomeprazole (NEXIUM) 40 MG capsule Take 1 capsule (40 mg total) by mouth daily before breakfast.  30 capsule  11  . Fluticasone-Salmeterol (ADVAIR) 100-50 MCG/DOSE AEPB Inhale 1 puff into the lungs every 12 (twelve) hours.        Marland Kitchen HYDROcodone-acetaminophen (LORTAB) 10-500 MG per tablet Take 1 tablet by mouth four times daily per Dr. Ethelene Hal       . hydroxyurea (HYDREA) 500 MG capsule Take two tabs on Monday and one every day for the rest of the week...Marland KitchenMarland KitchenMay take with food to minimize GI side effects per Dr. Cyndie Chime      . metoprolol tartrate (LOPRESSOR) 25 MG tablet Take 1 tablet (25 mg total) by mouth 2 (two) times daily. As directed for heart and BP  60  tablet  5  . montelukast (SINGULAIR) 10 MG tablet TAKE 1 TABLET BY MOUTH ONCE A DAY  30 tablet  4  . Multiple Vitamin (MULTIVITAMIN PO) Take 1 tablet by mouth daily.        . nitroGLYCERIN (NITROSTAT) 0.4 MG SL tablet Place 0.4 mg under the tongue every 5 (five) minutes as needed.        . Nutritional Supplements (ENSURE ENLIVE) LIQD Take 1 Can by mouth daily.       Marland Kitchen warfarin (COUMADIN) 3 MG tablet Take as directed       . megestrol (MEGACE) 40 MG/ML suspension Take by mouth Twice daily.        Allergies  Allergen Reactions  . Sulfonamide Derivatives     Pt is unsure of reaction    Family History  Problem Relation Age of Onset  . Colon cancer Son 39  . Crohn's disease Son   . Cancer Maternal Aunt     History   Social History  . Marital Status: Married    Spouse Name: william    Number of Children: 3  . Years of Education: N/A   Occupational History  . retired     Investment banker, corporate work  .     Social History Main Topics  . Smoking status:  Never Smoker   . Smokeless tobacco: Never Used  . Alcohol Use: Yes  . Drug Use: No  . Sexually Active: Not on file   Other Topics Concern  . Not on file   Social History Narrative  . No narrative on file    ROS ALL NEGATIVE EXCEPT THOSE NOTED IN HPI  PE  General Appearance: well developed, well nourished in no acute distress, elderly,HEENT: symmetrical face, PERRLA, g Neck: no JVD, thyromegaly, or adenopathy, trachea midline Chest: symmetric without deformity Cardiac: PMI non-displaced, RRR, normal S1, S2, no gallop or murmur Lung: clear to ausculation and percussion Vascular: all pulses full without bruits  Abdominal: nondistended, nontender, good bowel sounds, no HSM, no bruits Extremities: no cyanosis, clubbing or edema, no sign of DVT, no varicosities  Skin: normal color, no rashes Neuro: alert and oriented x 3, non-focal Pysch: normal affect  EKG Not repeated BMET    Component Value Date/Time   NA 138 09/02/2010  1455   NA 138 09/02/2010 1455   K 4.3 09/02/2010 1455   K 4.3 09/02/2010 1455   CL 101 09/02/2010 1455   CL 101 09/02/2010 1455   CO2 29 09/02/2010 1455   CO2 29 09/02/2010 1455   GLUCOSE 131* 09/02/2010 1455   GLUCOSE 131* 09/02/2010 1455   BUN 23 09/02/2010 1455   BUN 23 09/02/2010 1455   CREATININE 1.21* 09/02/2010 1455   CREATININE 1.21* 09/02/2010 1455   CALCIUM 9.7 09/02/2010 1455   CALCIUM 9.7 09/02/2010 1455   GFRNONAA >60 07/02/2010 0500   GFRAA  Value: >60        The eGFR has been calculated using the MDRD equation. This calculation has not been validated in all clinical situations. eGFR's persistently <60 mL/min signify possible Chronic Kidney Disease. 07/02/2010 0500    Lipid Panel     Component Value Date/Time   CHOL  Value: 146        ATP III CLASSIFICATION:  <200     mg/dL   Desirable  409-811  mg/dL   Borderline High  >=914    mg/dL   High        7/82/9562 0440   TRIG 125 07/20/2009 0440   HDL 45 07/20/2009 0440   CHOLHDL 3.2 07/20/2009 0440   VLDL 25 07/20/2009 0440   LDLCALC  Value: 76        Total Cholesterol/HDL:CHD Risk Coronary Heart Disease Risk Table                     Men   Women  1/2 Average Risk   3.4   3.3  Average Risk       5.0   4.4  2 X Average Risk   9.6   7.1  3 X Average Risk  23.4   11.0        Use the calculated Patient Ratio above and the CHD Risk Table to determine the patient's CHD Risk.        ATP III CLASSIFICATION (LDL):  <100     mg/dL   Optimal  130-865  mg/dL   Near or Above                    Optimal  130-159  mg/dL   Borderline  784-696  mg/dL   High  >295     mg/dL   Very High 2/84/1324 4010    CBC    Component Value Date/Time   WBC 7.8 01/24/2011  1431   WBC 6.5 07/02/2010 1124   RBC 3.23* 01/24/2011 1431   RBC 2.77* 07/02/2010 1124   HGB 12.1 01/24/2011 1431   HGB 10.7* 07/02/2010 1124   HCT 36.0 01/24/2011 1431   HCT 32.2* 07/02/2010 1124   PLT 433* 01/24/2011 1431   PLT 261 07/02/2010 1124   MCV 111.2* 01/24/2011 1431   MCV 116.2* 07/02/2010 1124   MCH 37.4*  01/24/2011 1431   MCH 38.6* 07/02/2010 1124   MCHC 33.6 01/24/2011 1431   MCHC 33.2 07/02/2010 1124   RDW 13.2 01/24/2011 1431   RDW 13.2 07/02/2010 1124   LYMPHSABS 1.7 01/24/2011 1431   LYMPHSABS 1.8 07/02/2010 0500   MONOABS 0.4 01/24/2011 1431   MONOABS 0.5 07/02/2010 0500   EOSABS 0.2 01/24/2011 1431   EOSABS 0.1 07/02/2010 0500   BASOSABS 0.0 01/24/2011 1431   BASOSABS 0.0 07/02/2010 0500

## 2011-03-16 NOTE — Assessment & Plan Note (Signed)
Stable.  Continue current medication

## 2011-03-21 ENCOUNTER — Ambulatory Visit (HOSPITAL_BASED_OUTPATIENT_CLINIC_OR_DEPARTMENT_OTHER): Payer: Self-pay | Admitting: Oncology

## 2011-03-21 ENCOUNTER — Ambulatory Visit (INDEPENDENT_AMBULATORY_CARE_PROVIDER_SITE_OTHER): Payer: Medicare Other | Admitting: Pulmonary Disease

## 2011-03-21 ENCOUNTER — Encounter: Payer: Self-pay | Admitting: Pulmonary Disease

## 2011-03-21 DIAGNOSIS — I82409 Acute embolism and thrombosis of unspecified deep veins of unspecified lower extremity: Secondary | ICD-10-CM

## 2011-03-21 DIAGNOSIS — I10 Essential (primary) hypertension: Secondary | ICD-10-CM

## 2011-03-21 DIAGNOSIS — M1990 Unspecified osteoarthritis, unspecified site: Secondary | ICD-10-CM

## 2011-03-21 DIAGNOSIS — D126 Benign neoplasm of colon, unspecified: Secondary | ICD-10-CM

## 2011-03-21 DIAGNOSIS — Z8679 Personal history of other diseases of the circulatory system: Secondary | ICD-10-CM

## 2011-03-21 DIAGNOSIS — M810 Age-related osteoporosis without current pathological fracture: Secondary | ICD-10-CM

## 2011-03-21 DIAGNOSIS — J449 Chronic obstructive pulmonary disease, unspecified: Secondary | ICD-10-CM

## 2011-03-21 DIAGNOSIS — I2510 Atherosclerotic heart disease of native coronary artery without angina pectoris: Secondary | ICD-10-CM

## 2011-03-21 DIAGNOSIS — F411 Generalized anxiety disorder: Secondary | ICD-10-CM

## 2011-03-21 DIAGNOSIS — K219 Gastro-esophageal reflux disease without esophagitis: Secondary | ICD-10-CM

## 2011-03-21 NOTE — Patient Instructions (Signed)
Today we updated your med list in our EPIC system...    Continue your current medications the same...  Call for any questions or if we can be of service in any way...  Let's plan a follow up visit in 4-6 months w/ FASTING blood work at that time.Marland KitchenMarland Kitchen

## 2011-03-21 NOTE — Progress Notes (Signed)
Left msg for Ms Chaney at her home # to cont. Same 3 mg/day.  Asked her to call if problems. Faxed result to [employer] (fax [phone]).  Spoke w/ Tiffany at [facility] & informed her of next date of lab draw 04/11/11 & dose staying same. Marily Lente, Pharm.D.

## 2011-03-21 NOTE — Progress Notes (Signed)
Subjective:    Patient ID: Sherri Crawford, female    DOB: 03-Feb-1921, 78 y.o.   MRN: [id number]  HPI 78 y/o WF here for a follow up visit... she has mult med problems including:  Asthmatic Bronchitis;  HBP;  CAD;  Hyperchol;  GERD/ Colon polyps;  DJD/ LBP/ Osteoporosis;  ?hx TIA;  Essential Thrombocytosis on Hydroxyurea per [name]...  ~  July 30, 2009:  Hosp x2d by [facility] for what sounds like another NTG misadventure> transient syncopal spell after 1NTG for ?lower CP/ epig discomfort, then weak & had to crawl to bedroom...  Exam was unrevealing, EKG's w/o acute changes, Enz & labs= neg, 2DEcho w/ mild LVH, EF= 60-65%, no wall motion abn, gr1 DD, ?mass in RA?;  subseq TEE showed norm LV w/ EF= 60-65%, incr thickness of atrial septum c/w severe lipomatous hypertrophy (no mass), mod plaque in Ao... we discussed throwing out her NTG & using Pepcid AC, Mylanta liq, Tramadol for future episodes of discomfort... she has been following up w/ [name] on Hydroxyurea> plat count down to 600K.  ~  May 23, 2010:  9 month ROV & review> she is c/o decr appetite and weight loss assoc w/ ?3wk hx swallowing difficulty that seems intermittent & occurs most often after she has eaten 1/2 of her lunch meal "it doesn't want to go down" & has on occas vomited it back up; she takes Nexium 40mg /d; Weights on office documented 103-105 over the last 6months in 2011, & dropped from 103 to 97# from 1/12 to today (6# wt loss in 71mo);  She was instructed to incr Ensure consumption & we discussed Megace trial of appetite & refer to GI for swallowing difficulty (NOTE: prev EGD 2008 showed GERD & esophagitis, &Colon showed tub adenoma)...    AB>  Stable on Advair, Singulair, Albuterol prn;  No recent exac- doing satis from the pulm standpoint...    HBP/ CAD>  On ASA & only takes 25mg  Metoprolol if BP>150 (this is how she likes to do it);  She had 2 NTG reactions & this was discontinued 5/11;  BP today = 100/50 w/ her wt loss,  no angina etc;  Last saw [name] 1/12> stable, no changes, f/u 33yr...    Chol>  On Lip20 now & last FLP 4/11 on Lip10 reviewed;  She is not fasting today so we can't check FLP, wonder if we even need it now w/ her wt loss, but in view of her signif coronary & Ao calcif & ?prev TIA> rec continue Lip20 for now & f/u FLP when able...    GI>  See above...    DJD/ LBP/ Osteop> followed by [name] on Lyrica, Hydrocodone, Celebrex prn;  Given last epid steroid shot 1/12, mild benefit;  NOTE: today's CXR shows new ant wedging T8 (~40%), last BMD 2008 showed TScore +0.4 in spine (it is not in [employer] EMR)...    Heme>  Followed by [name] w/ Essential Thrombocytosis on Hydroxyurea (last note 1/12 reviewed- well controlled on 500mg /d x 1000mg  on Mondays w/ plats betw 218-365K)...  ~  Jul 13, 2010:  71mo ROV & post hosp check> GI eval 4/12 DrGessner showed abn Ba Esophagram w/ diffuse motility disorder & narrowing of distal esoph, plus laryngeal penetration; subseq EGD by [name] showed a presbyesoph & spasm in the distal esoph w/o stricture, Savory dilatation done, mult sm gastric polyps noted...  She was Adm 5/4 - 07/03/10 w/ left leg DVT, unprovoked (hypercoag panel- pending), w/ hx underlying  essential thrombocytosis on Hydroxyurea from [name];  Started on Coumadin w/ Lovenox bridge & early disch w/ protimes via visiting nurse & called to [name] who already checks CBC monthly...  Feeling better since the hosp, denies CP/ palpit/ SOB, & swelling in left leg is diminished- just mild now & she knows to elim salt, elevate leg, wear support hose...  ~  November 16, 2010:  28mo ROV & she continues stable w/ severe underlying multisys pathology as noted>>    COPD/ AB> She was seen by DrESL 8/12 & he stopped her Singulair & decreased her Advair to 100/50 Bid, but since then she has had to increase her Proair use to 3-4 times daily;     Cards> HBP, CAD, Hx DVT> on Metoprolol 25mg Bid & BP= 126/82; she  reports feeling ok & denies CP, palpit, ch in SOB, edema, etc; she also remains on Coumadin with Protimes monitored in DrGranfortunas office...    CHOL> on Lip20 but he last FLP was over 92yr ago & she forgets to come fasting for f/u lab here, we discussed this again, see below...    GI> GERD, Polyps> on Nexium 40mg /d + Ensure & Megace for appetite & wt gain- now at 105#; followed by DrGessner for GI> his notes are reviewed...    DJD, Osteoporosis> on calcium, MVI, Vit D 1000 u daily; she has mod diffuse DJD & uses Tylenol as needed...    Thrombocythemia> she is followed by [name] on Hydrea & his note of 7/12 is reviewed w/ platelets counts betw 275-359...  ~  March 21, 2011:  28mo ROV & she is here for routine follow up> reports doing well w/o new complaints or concerns today... She lives at [facility] & has been walking daily to the AL unit to see husb who fell w/ hip fx ~58mo ago; we discussed exercise/ rehab/ etc.    She saw [name] for Cards f/u 1/13> Hx CAD- no CP, palpit, dizziness, etc; also on Coumadin for hx DVT & thrombocytosis- followed by [name]; stable & no changes made...    Labs & Meds reviewed w/ pt> Protimes ok per [name];  CBC 11/12- Hg=12.1 & Plat=433 on her Hydrea Rx;  Chems- all ok...          Problem List:  MACULAR DEGENERATION (ICD-362.50) - sees [name] for follow up- on Vitamin therapy...  ASTHMATIC BRONCHITIS & COPD - she was stable on Advair250 + Singulair10 w/ Prn albuterol, but 8/12 DrESL stopped her Singulair & cut the ADVAIR to 100/50Bid; since then she reports that she's had to increase the Proair use to 3-4 times daily but she does not want to go back up on the Advair dose;  she still takes allergy shots every 2weeks at [place].  HYPERTENSION,  CORONARY ARTERY DISEASE,  RBBB - on METOPROLOL 25mg  Bid & off ASA mow that she's on Coumadin... she has known non-obstructive CAD and followed by [name]... she was prev on Dyazide but stopped this  med... ~  Nuclear Stress Test 9/09 was normal- no infarct, no ischemia, EF= 74%... ~  3/11 & 5/11 >> 2 episodes of NTG reactions (see above) therefore NTG discontinued... ~  3/12:  BP= 110/70 & she takes METOPROLOL 25mg  if her BP is > 150 (this is how she likes to do it); denies HA, fatigue, visual changes, recurrent CP, palipit, dizziness, syncope, dyspnea, edema, etc... ~  5/12 Hosp w/ left leg DVT> CXR showed norm heart size, clear lungs x atx at bases, old T8 compression;  EKG showed SBrady, RBBB; Coumadin started & ASA discontinued. ~  5/12:  BP= 118/60 & she is essent asymptomatic now... ~  9/12:  BP= 126/82 & stable on Metop25Bid + diet etc... ~  1/13: BP= 108/64 & she remains essent asymptomatic...  DVT>  On Coumadin started 5/12 Hosp & protimes monitored by [name] due to this & essential thrombocytosis...  HYPERCHOLESTEROLEMIA - on diet + LIPITOR 20mg /d now... tolerated well...  ~  FLP 1/08 showed TChol 198, TG 129, HDL 59, LDL 113... ~  FLP 5/09 showed TChol 168, TG 100, HDL 49, LDL 99... ~  FLP 4/10 on Lip10 showed TChol 183, TG 117, HDL 50, LDL 109... rec> same med, better diet. ~  FLP 4/11 on Lip10 showed TChol 223, Tg 87, HDL 62, LDL 152... rec incr Lip20. ~  She is reminded to ret FASTING to f/u on her FLP...  GERD - on NEXIUM 40mg /d... ~  [name] did an EGD 4/08 showing GERD, esophagitis and Rx w/ NEXIUM 40/d (it helps)... ~  EGD 4/12 by [name] showed a presbyesoph & spasm in the distal esoph w/o stricture, Savory dilatation done, mult sm gastric polyps noted...    COLONIC POLYPS - last colonoscopy was 4/08 by [name] showing 3mm polyp= tubular adenoma...   DEGENERATIVE JOINT DISEASE (ICD-715.90) BACK PAIN, LUMBAR - she has signif Back Pain and is followed in the pain clinic ([name] now & he has her on LORCET-HD 10/500 taking 1Qid); off prev Lyrica, Celebrex, Tramadol...  OSTEOPOROSIS (ICD-733.00) - on BONIVA 150mg /mo + Calcium & Vitamins... ~  BMD here 5/01  showed TScores -0.1 in Spine, & -1.4 in left FemNeck ~  BMD here 7/08 showed TScores +0.4 in Spine, & -1.6 in left FemNeck...  ~  labs 4/10 showed Vit D level = 34... rec> take Vit D 1000 u OTC daily. ~  9/12:  Her Boniva was stopped in the interim & she is not sure when (likely during her hosp)> rec to continue calcium, MVI, Vit D & recheck BMD.  TRANSIENT ISCHEMIC ATTACKS, HX OF - she has sm vessel disease on prev scans... Prev on 81mg  ECASA daily but this was stopped when she started on the Coumadin for DVT. ~  3/11: went to ER w/ episode of CP, s/p 3 NTG, developed left arm weakness, no speech or leg symptoms... she was off ASA for several days in anticipation of shot from [name]... ? NTG related, ?off ASA related, ?need for Plavix... decision made to stay on ASA, limit NTG.  ANXIETY (ICD-300.00) - not currently on medication...  ESSENTIAL THROMBOCYTHEMIA (ICD-238.71) - noted to have rising platelet count & referred to [facility] 4/11 w/ Dx of Essential Thrombocytosis/ myeloproliferative disorder & started on HYDROXYUREA 500mg /d w/ titration from DrG... ~  labs 4/11 showed platlet count = 994K... Hydroxyurea started by Heme. ~  labs 5/11 showed platelet count = 602K ~  Labs 3/12 showed Hg= 12.2 (MCV=118), WBC= 5.7, Plat=294K ~  Labs in hosp 5/12 showed Hg down to 10.7, MCV= 116... ~  [name] continues to monitor labs & protimes monthly thru his clinic... ~  EPIC labs reviewed & 11/12 Hg=12.1 and Plat= 433K...   Past Surgical History  Procedure Date  . Appendectomy   . Cholecystectomy   . Tah and bso 08/21/1995    Outpatient Encounter Prescriptions as of 03/21/2011  Medication Sig Dispense Refill  . albuterol (PROAIR HFA) 108 (90 BASE) MCG/ACT inhaler Inhale 2 puffs into the lungs every 6 (six) hours as needed for  wheezing. As needed for wheezing  1 Inhaler  11  . atorvastatin (LIPITOR) 20 MG tablet Take 1 tablet (20 mg total) by mouth daily.  30 tablet  5  . Cholecalciferol  (VITAMIN D3) 1000 UNITS CAPS Take 1 capsule by mouth daily.        Marland Kitchen EPIPEN 2-PAK 0.3 MG/0.3ML DEVI as needed.      Marland Kitchen esomeprazole (NEXIUM) 40 MG capsule Take 1 capsule (40 mg total) by mouth daily before breakfast.  30 capsule  11  . Fluticasone-Salmeterol (ADVAIR) 100-50 MCG/DOSE AEPB Inhale 1 puff into the lungs every 12 (twelve) hours.        Marland Kitchen HYDROcodone-acetaminophen (LORTAB) 10-500 MG per tablet Take 1 tablet by mouth four times daily per Dr. Ethelene Hal       . hydroxyurea (HYDREA) 500 MG capsule Take two tabs on Monday and one every day for the rest of the week...Marland KitchenMarland KitchenMay take with food to minimize GI side effects per Dr. Cyndie Chime      . megestrol (MEGACE) 40 MG/ML suspension Take by mouth Twice daily.      . metoprolol tartrate (LOPRESSOR) 25 MG tablet Take 1 tablet (25 mg total) by mouth 2 (two) times daily. As directed for heart and BP  60 tablet  5  . montelukast (SINGULAIR) 10 MG tablet TAKE 1 TABLET BY MOUTH ONCE A DAY  30 tablet  4  . Multiple Vitamin (MULTIVITAMIN PO) Take 1 tablet by mouth daily.        . nitroGLYCERIN (NITROSTAT) 0.4 MG SL tablet Place 0.4 mg under the tongue every 5 (five) minutes as needed.        . Nutritional Supplements (ENSURE ENLIVE) LIQD Take 1 Can by mouth daily.       Marland Kitchen warfarin (COUMADIN) 3 MG tablet Take as directed         Allergies  Allergen Reactions  . Sulfonamide Derivatives     Pt is unsure of reaction    Current Medications, Allergies, Past Medical History, Past Surgical History, Family History, and Social History were reviewed in [employer] record.    Review of Systems        See HPI - all other systems neg except as noted... The patient complains of decreased hearing and dyspnea on exertion.  The patient denies anorexia, fever, weight loss, weight gain, vision loss, hoarseness, chest pain, syncope, peripheral edema, prolonged cough, headaches, hemoptysis, abdominal pain, melena, hematochezia, severe  indigestion/heartburn, hematuria, incontinence, muscle weakness, suspicious skin lesions, transient blindness, difficulty walking, depression, unusual weight change, abnormal bleeding, enlarged lymph nodes, and angioedema.     Objective:   Physical Exam     WD, Thin, 78 y/o WF in NAD... GENERAL:  Alert & oriented; pleasant & cooperative... HEENT:  [place]/AT, EOM-wnl, PERRLA, EACs-clear, TMs-wnl, NOSE-clear, THROAT-clear & wnl. NECK:  Supple w/ fairROM; no JVD; normal carotid impulses w/o bruits; no thyromegaly or nodules palpated; no lymphadenopathy. CHEST:  Decr BS bilat & clear to P & A; without wheezes/ rales/ or rhonchi heard... HEART:  Regular Rhythm; without murmurs/ rubs/ or gallops detected...  ABDOMEN:  Thin, soft & nontender; normal bowel sounds; no organomegaly or masses palpated... EXT: without deformities, mild arthritic changes; no varicose veins/ +venous insuffic/ no edema. BACK:  kyphosis without focal tenderness... NEURO:  CN's intact; no focal neuro deficits; gait abn due to LBP... DERM:  No lesions noted; no rash etc...  RADIOLOGY DATA:  Reviewed in the EPIC EMR & discussed w/ the patient.Marland KitchenMarland Kitchen  LABORATORY DATA:  Reviewed in the EPIC EMR & discussed w/ the patient...   Assessment & Plan:   DVT>  She is on Coumadin w/ protimes by visiting nurses & called to [name] who will be monitoring this going forward since he already monitors her Hydroxyurea for the thrombocytosis...  Essential Thrombocytosis>  As above, stable on Hydroxyurea, monitored by DrG...  COPD>  She has noted increase Proair use since DrESL cut out her Singulair & decreased her Advair; we will monitor her breathing closely & she will call prn problems...  HBP/ CAD>  Stable on Metoprolol, no angina etc...  CHOL>  Needs to ret for FLP on the Lip20...  DJD/ Osteoporosis>  She will need f/u BMD when able for f/u of her TScores...  Other medical problems as noted...   Patient's Medications  New  Prescriptions   No medications on file  Previous Medications   ALBUTEROL (PROAIR HFA) 108 (90 BASE) MCG/ACT INHALER    Inhale 2 puffs into the lungs every 6 (six) hours as needed for wheezing. As needed for wheezing   ATORVASTATIN (LIPITOR) 20 MG TABLET    Take 1 tablet (20 mg total) by mouth daily.   CHOLECALCIFEROL (VITAMIN D3) 1000 UNITS CAPS    Take 1 capsule by mouth daily.     EPIPEN 2-PAK 0.3 MG/0.3ML DEVI    as needed.   ESOMEPRAZOLE (NEXIUM) 40 MG CAPSULE    Take 1 capsule (40 mg total) by mouth daily before breakfast.   FLUTICASONE-SALMETEROL (ADVAIR) 100-50 MCG/DOSE AEPB    Inhale 1 puff into the lungs every 12 (twelve) hours.     HYDROCODONE-ACETAMINOPHEN (LORTAB) 10-500 MG PER TABLET    Take 1 tablet by mouth four times daily per Dr. Ethelene Hal    MEGESTROL (MEGACE) 40 MG/ML SUSPENSION    Take by mouth Twice daily.   METOPROLOL TARTRATE (LOPRESSOR) 25 MG TABLET    Take 1 tablet (25 mg total) by mouth 2 (two) times daily. As directed for heart and BP   MONTELUKAST (SINGULAIR) 10 MG TABLET    TAKE 1 TABLET BY MOUTH ONCE A DAY   MULTIPLE VITAMIN (MULTIVITAMIN PO)    Take 1 tablet by mouth daily.     NITROGLYCERIN (NITROSTAT) 0.4 MG SL TABLET    Place 0.4 mg under the tongue every 5 (five) minutes as needed.     NUTRITIONAL SUPPLEMENTS (ENSURE ENLIVE) LIQD    Take 1 Can by mouth daily.    WARFARIN (COUMADIN) 3 MG TABLET    Take as directed   Modified Medications   Modified Medication Previous Medication   HYDROXYUREA (HYDREA) 500 MG CAPSULE hydroxyurea (HYDREA) 500 MG capsule      Take 1 capsule (500 mg total) by mouth as directed. Take two tabs on Monday and one every day for the rest of the week...Marland KitchenMarland KitchenMay take with food to minimize GI side effects per Dr. Cyndie Chime    Take two tabs on Monday and one every day for the rest of the week...Marland KitchenMarland KitchenMay take with food to minimize GI side effects per Dr. Cyndie Chime  Discontinued Medications   No medications on file

## 2011-03-28 ENCOUNTER — Other Ambulatory Visit: Payer: Medicare Other | Admitting: Lab

## 2011-03-28 ENCOUNTER — Ambulatory Visit (HOSPITAL_BASED_OUTPATIENT_CLINIC_OR_DEPARTMENT_OTHER): Payer: Medicare Other | Admitting: Oncology

## 2011-03-28 ENCOUNTER — Telehealth: Payer: Self-pay | Admitting: Oncology

## 2011-03-28 VITALS — BP 165/81 | HR 69 | Temp 99.0°F | Ht 61.0 in | Wt 101.6 lb

## 2011-03-28 DIAGNOSIS — I82409 Acute embolism and thrombosis of unspecified deep veins of unspecified lower extremity: Secondary | ICD-10-CM

## 2011-03-28 DIAGNOSIS — D473 Essential (hemorrhagic) thrombocythemia: Secondary | ICD-10-CM

## 2011-03-28 DIAGNOSIS — Z7901 Long term (current) use of anticoagulants: Secondary | ICD-10-CM

## 2011-03-28 LAB — CBC WITH DIFFERENTIAL/PLATELET
Basophils Absolute: 0 10*3/uL (ref 0.0–0.1)
Eosinophils Absolute: 0.2 10*3/uL (ref 0.0–0.5)
HGB: 12.5 g/dL (ref 11.6–15.9)
LYMPH%: 21.7 % (ref 14.0–49.7)
MCH: 36 pg — ABNORMAL HIGH (ref 25.1–34.0)
MCV: 108 fL — ABNORMAL HIGH (ref 79.5–101.0)
MONO%: 5.3 % (ref 0.0–14.0)
NEUT#: 4.5 10*3/uL (ref 1.5–6.5)
Platelets: 400 10*3/uL (ref 145–400)
RBC: 3.48 10*6/uL — ABNORMAL LOW (ref 3.70–5.45)

## 2011-03-28 LAB — COMPREHENSIVE METABOLIC PANEL
ALT: 15 U/L (ref 0–35)
AST: 23 U/L (ref 0–37)
Albumin: 4.2 g/dL (ref 3.5–5.2)
Alkaline Phosphatase: 44 U/L (ref 39–117)
BUN: 25 mg/dL — ABNORMAL HIGH (ref 6–23)
CO2: 24 mEq/L (ref 19–32)
Calcium: 9.6 mg/dL (ref 8.4–10.5)
Chloride: 105 mEq/L (ref 96–112)
Creatinine, Ser: 1.04 mg/dL (ref 0.50–1.10)
Glucose, Bld: 93 mg/dL (ref 70–99)
Potassium: 4.7 mEq/L (ref 3.5–5.3)
Sodium: 138 mEq/L (ref 135–145)
Total Bilirubin: 0.6 mg/dL (ref 0.3–1.2)
Total Protein: 6.7 g/dL (ref 6.0–8.3)

## 2011-03-28 LAB — PROTIME-INR: Protime: 28.8 Seconds — ABNORMAL HIGH (ref 10.6–13.4)

## 2011-03-28 LAB — LACTATE DEHYDROGENASE: LDH: 189 U/L (ref 94–250)

## 2011-03-28 LAB — URIC ACID: Uric Acid, Serum: 4.3 mg/dL (ref 2.4–7.0)

## 2011-03-28 NOTE — Telephone Encounter (Signed)
gv pts son appts for [id number].  pt [facility]

## 2011-03-28 NOTE — Progress Notes (Signed)
Hematology and Oncology Follow Up Visit  Sherri Crawford [id number] 1920-03-20 78 y.o. 03/28/2011 12:29 PM   Principle Diagnosis: Encounter Diagnoses  Name Primary?  . Essential thrombocythemia   . DVT (deep venous thrombosis) Yes     Interim History:   Followup visit for this pleasant 78 year old woman with essential thrombocythemia well-controlled on low dose Hydrea. She sustained a spontaneous DVT of her left lower extremity in May 2012. She was started on Coumadin anticoagulation and continues on the same. We have had a difficult time adjusting her INR but finally over the last month we have her on a stable dose. She is currently on 3 mg daily. She's had no interim medical problems. She denies any headache, change in vision, dyspnea, chest pain, palpitations, change in bowel habits, focal weakness, urinary tract symptoms. Medications: reviewed  Allergies:  Allergies  Allergen Reactions  . Sulfonamide Derivatives     Pt is unsure of reaction    Review of Systems: Constitutional:   No constitutional symptoms Respiratory: See above Cardiovascular: See above  Gastrointestinal: See above Genito-Urinary: The above Musculoskeletal: No change in her arthritis pain Neurologic: See above Skin: No rash or ecchymosis Remaining ROS negative.  Physical Exam: Blood pressure 165/81, pulse 69, temperature 99 F (37.2 C), temperature source Oral, height 5\' 1"  (1.549 m), weight 101 lb 9.6 oz (46.085 kg). Wt Readings from Last 3 Encounters:  03/28/11 101 lb 9.6 oz (46.085 kg)  03/21/11 101 lb 9.6 oz (46.085 kg)  03/16/11 103 lb (46.72 kg)     General appearance: Frail elderly woman HENNT: Pharynx no erythema or exudate Lymph nodes: No cervical supraclavicular or axillary adenopathy Breasts: Not examined Lungs: Minimal rales at the bases resonant to percussion throughout Heart: Regular cardiac rhythm no murmur Abdomen: Soft nontender no mass no organomegaly Extremities: No edema  no calf tenderness Vascular: No cyanosis Neurologic: Pupils equal reactive to light optic disc sharp on the left vessels normal I was unable to visualize the right fundus. She is alert and oriented. Cranial nerves grossly normal. Motor strength 5 over 5. Reflexes 2+ symmetric. Skin: No rash or ecchymosis  Lab Results: Lab Results  Component Value Date   WBC 6.4 03/28/2011   HGB 12.5 03/28/2011   HCT 37.6 03/28/2011   MCV 108.0* 03/28/2011   PLT 400 03/28/2011     Chemistry      Component Value Date/Time   NA 138 09/02/2010 1455   NA 138 09/02/2010 1455   K 4.3 09/02/2010 1455   K 4.3 09/02/2010 1455   CL 101 09/02/2010 1455   CL 101 09/02/2010 1455   CO2 29 09/02/2010 1455   CO2 29 09/02/2010 1455   BUN 23 09/02/2010 1455   BUN 23 09/02/2010 1455   CREATININE 1.21* 09/02/2010 1455   CREATININE 1.21* 09/02/2010 1455      Component Value Date/Time   CALCIUM 9.7 09/02/2010 1455   CALCIUM 9.7 09/02/2010 1455   ALKPHOS 57 09/02/2010 1455   ALKPHOS 57 09/02/2010 1455   AST 20 09/02/2010 1455   AST 20 09/02/2010 1455   ALT 13 09/02/2010 1455   ALT 13 09/02/2010 1455   BILITOT 0.5 09/02/2010 1455   BILITOT 0.5 09/02/2010 1455       Radiological Studies:   Impression and Plan: #1. Myeloproliferative disorder/essential thrombocythemia Counts controlled on current dose of Hydrea. Plan continue the same. Continue monthly lab monitoring. #2. Left lower extremity DVT. Due to her underlying myeloproliferative disorder and her age she will be  kept on long-term Coumadin anticoagulation. We will continue to monitor her protimes in our office Coumadin clinic. #3. Asthma #4. Degenerative arthritis of the spine #5. Nonobstructive coronary artery disease. #6. Hyperlipidemia. #7. Essential hypertension   CC:. Dr. [name]; Dr. Valera Castle   Levert Feinstein, MD 1/29/201312:29 PM

## 2011-03-29 ENCOUNTER — Other Ambulatory Visit: Payer: Self-pay

## 2011-03-29 DIAGNOSIS — D473 Essential (hemorrhagic) thrombocythemia: Secondary | ICD-10-CM

## 2011-03-29 MED ORDER — HYDROXYUREA 500 MG PO CAPS: 500.0000 mg | ORAL_CAPSULE | ORAL | Status: DC

## 2011-04-01 ENCOUNTER — Encounter: Payer: Self-pay | Admitting: Pulmonary Disease

## 2011-04-07 ENCOUNTER — Telehealth: Payer: Self-pay | Admitting: *Deleted

## 2011-04-07 NOTE — Telephone Encounter (Signed)
Notified Corey Harold, nurse for [facility] that pt should stay on same dose of hydrea = 500 mg daily except 1000 mg on mondays per [name] NP.

## 2011-04-27 ENCOUNTER — Other Ambulatory Visit: Payer: Self-pay | Admitting: Oncology

## 2011-05-04 ENCOUNTER — Telehealth: Payer: Self-pay | Admitting: *Deleted

## 2011-05-04 NOTE — Telephone Encounter (Signed)
Called & spoke to [place], nurse at [facility] at [place] & reported that pt should stay on same dose of hydrea = 1000mg  on Mon & 500mg  qod & cont. Monthly cbc per Dr Cyndie Chime.

## 2011-05-09 ENCOUNTER — Ambulatory Visit: Payer: Self-pay | Admitting: Pharmacist

## 2011-05-09 DIAGNOSIS — I82409 Acute embolism and thrombosis of unspecified deep veins of unspecified lower extremity: Secondary | ICD-10-CM

## 2011-05-09 NOTE — Progress Notes (Signed)
I spoke w/ Levy Sjogren, RN at [facility].  She stated she had filled the pill box for Sherri Crawford & there were 3 mg/day of Coumadin in the box.  No new meds/abx. I spoke w/ Sherri. Varkey & she confirmed she has not taken double doses of her Coumadin, just 1 tablet each day as was in her pill box. Corey Harold will remove 2 doses from the pill box so pt will hold x 2 doses this week then resume 3 mg daily starting Friday. Recheck INR next Tuesday. Marily Lente, Pharm.D.

## 2011-05-16 ENCOUNTER — Ambulatory Visit (HOSPITAL_BASED_OUTPATIENT_CLINIC_OR_DEPARTMENT_OTHER): Payer: Medicare Other | Admitting: Pharmacist

## 2011-05-16 DIAGNOSIS — I82409 Acute embolism and thrombosis of unspecified deep veins of unspecified lower extremity: Secondary | ICD-10-CM

## 2011-05-16 LAB — POCT INR: INR: 1.9

## 2011-05-16 NOTE — Progress Notes (Signed)
INR = 1.9 today. Pt forgot to take her Coumadin last night. She did follow our instructions last week & held x 2 doses then back to 3 mg/day (INR was high last week). Continue 3 mg/day. Recheck INR in 10 days. I spoke w/ Levy Sjogren, RN at [facility] & she is aware of the plan.  She will visit w/ pt this afternoon to refill her pill box. Marily Lente, Pharm.D.

## 2011-05-26 ENCOUNTER — Ambulatory Visit (HOSPITAL_BASED_OUTPATIENT_CLINIC_OR_DEPARTMENT_OTHER): Payer: Medicare Other | Admitting: Pharmacist

## 2011-05-26 DIAGNOSIS — I82409 Acute embolism and thrombosis of unspecified deep veins of unspecified lower extremity: Secondary | ICD-10-CM

## 2011-05-26 NOTE — Progress Notes (Signed)
INR slightly supratherapeutic (3.3, goal = 2-2.5).  Will have pt hold one dose, then resume 3mg  daily.  She has not had any problems with bleeding or bruising.  No changes in meds or diet per Corey Harold, Charity fundraiser at [facility]. She prepared Ms. Tagle's pill box for the week, omitting tonight's dose of Coumadin.  She will recheck her INR in 1 week at [facility].

## 2011-05-31 ENCOUNTER — Other Ambulatory Visit: Payer: Self-pay | Admitting: Pulmonary Disease

## 2011-05-31 MED ORDER — METOPROLOL TARTRATE 25 MG PO TABS: 25.0000 mg | ORAL_TABLET | Freq: Two times a day (BID) | ORAL | Status: DC

## 2011-05-31 MED ORDER — ATORVASTATIN CALCIUM 20 MG PO TABS: 20.0000 mg | ORAL_TABLET | Freq: Every day | ORAL | Status: DC

## 2011-06-02 ENCOUNTER — Ambulatory Visit: Payer: Self-pay | Admitting: Pharmacist

## 2011-06-02 DIAGNOSIS — I82409 Acute embolism and thrombosis of unspecified deep veins of unspecified lower extremity: Secondary | ICD-10-CM

## 2011-06-02 NOTE — Patient Instructions (Signed)
Continue 3mg daily. Recheck INR in 1 week at [facility]. 

## 2011-06-02 NOTE — Progress Notes (Signed)
Continue 3mg  daily. Recheck INR in 1 week at [facility].

## 2011-06-09 ENCOUNTER — Ambulatory Visit (HOSPITAL_BASED_OUTPATIENT_CLINIC_OR_DEPARTMENT_OTHER): Payer: Medicare Other | Admitting: Pharmacist

## 2011-06-09 DIAGNOSIS — I82409 Acute embolism and thrombosis of unspecified deep veins of unspecified lower extremity: Secondary | ICD-10-CM

## 2011-06-09 NOTE — Progress Notes (Signed)
Spoke to [place], Charity fundraiser at [facility].  Will cont Coumadin 3mg  daily but check next week since INR increased from 1.7 to 2.8 in 1 week.  Also spoke to pt.  No bleeding/bruising.  No changes in medications.  Pt is doing well.

## 2011-06-14 ENCOUNTER — Encounter: Payer: Self-pay | Admitting: Oncology

## 2011-06-16 ENCOUNTER — Ambulatory Visit: Payer: Self-pay | Admitting: Pharmacist

## 2011-06-16 DIAGNOSIS — I82409 Acute embolism and thrombosis of unspecified deep veins of unspecified lower extremity: Secondary | ICD-10-CM

## 2011-06-16 NOTE — Progress Notes (Signed)
INR = 2.4 on 3 mg/day. S/w pt today by phone; she missed 1 dose yesterday. No change to dose. Repeat protime on 4/29 at [facility]. Marily Lente, Pharm.D.

## 2011-06-26 ENCOUNTER — Ambulatory Visit (HOSPITAL_BASED_OUTPATIENT_CLINIC_OR_DEPARTMENT_OTHER): Payer: Medicare Other | Admitting: Pharmacist

## 2011-06-26 DIAGNOSIS — I82409 Acute embolism and thrombosis of unspecified deep veins of unspecified lower extremity: Secondary | ICD-10-CM

## 2011-06-26 LAB — POCT INR: INR: 3.1

## 2011-06-26 NOTE — Progress Notes (Signed)
INR = 3.1 on 3 mg/day. I s/w pt over phone today.  She is doing well.  No complaints. Hold x 1 dose of Coumadin then back to 3 mg/day. Recheck INR 07/04/11. Elmarie Shiley, RN at [facility] aware of plan. Marily Lente, Pharm.D.

## 2011-07-03 ENCOUNTER — Other Ambulatory Visit: Payer: Self-pay | Admitting: *Deleted

## 2011-07-03 DIAGNOSIS — D473 Essential (hemorrhagic) thrombocythemia: Secondary | ICD-10-CM

## 2011-07-03 MED ORDER — WARFARIN SODIUM 3 MG PO TABS: ORAL_TABLET | ORAL | Status: DC

## 2011-07-04 ENCOUNTER — Telehealth: Payer: Self-pay | Admitting: *Deleted

## 2011-07-04 ENCOUNTER — Ambulatory Visit: Payer: Self-pay | Admitting: Pharmacist

## 2011-07-04 DIAGNOSIS — I82409 Acute embolism and thrombosis of unspecified deep veins of unspecified lower extremity: Secondary | ICD-10-CM

## 2011-07-04 LAB — POCT INR: INR: 2.9

## 2011-07-04 NOTE — Telephone Encounter (Signed)
Message left on recorder at [facility] [place]/Health Coordinator for pt to cont. same dose of hydrea=1000 mg on Mondays & 500 mg other days & to cont cbc q month.  The pt was also notified.

## 2011-07-04 NOTE — Telephone Encounter (Signed)
Correction:  Corey Harold RN [facility] called & states pt is on 1000 mg on Monday & 500 mg qod.  Verified in previous notes & pt to cont. the same.

## 2011-07-04 NOTE — Patient Instructions (Signed)
Spoke with patient over the phone and she has not changes to report.  She held the one dose as instructed.  Instructed her to continue her 3mg  daily and we will recheck her INR in one week.  [facility] has been contacted.

## 2011-07-04 NOTE — Progress Notes (Signed)
Spoke with patient over the phone and she has not changes to report.  She held the one dose as instructed.  Instructed her to continue her 3mg daily and we will recheck her INR in one week.  [facility] has been contacted. 

## 2011-07-11 ENCOUNTER — Other Ambulatory Visit: Payer: Self-pay | Admitting: Pulmonary Disease

## 2011-07-11 ENCOUNTER — Ambulatory Visit: Payer: Self-pay | Admitting: Pharmacist

## 2011-07-11 DIAGNOSIS — I82409 Acute embolism and thrombosis of unspecified deep veins of unspecified lower extremity: Secondary | ICD-10-CM

## 2011-07-11 NOTE — Progress Notes (Signed)
INR = 3.2 Spoke w/ Mrs Hendrix over the phone.  She reports no problems re: anticoag.  She has not missed any doses nor has she taken any extra doses. Hold x 1 dose today then 3 mg/day I have instructed pt & Levy Sjogren, RN at [facility]. Repeat protime next Wednesday. Marily Lente, Pharm.D.

## 2011-07-19 ENCOUNTER — Ambulatory Visit: Payer: Self-pay | Admitting: Pharmacist

## 2011-07-19 DIAGNOSIS — I82409 Acute embolism and thrombosis of unspecified deep veins of unspecified lower extremity: Secondary | ICD-10-CM

## 2011-07-19 NOTE — Progress Notes (Signed)
INR therapeutic (2.3) at [facility] after holding 1 dose of 07/11/11, then resuming 3mg  daily.  Spoke with Corey Harold, RN from [facility].  She has not noticed any missed doses, and will speak to pt regarding bleeding/bruising/changes in meds/diet with the patient when she updates the patient's pill box this afternoon since I have been unable to reach her by phone today.  If anything is amiss, she will call us back.  Will continue current dose of 3mg  daily, and recheck INR in 1 week at [facility].  Corey Harold communicated understanding.

## 2011-07-25 ENCOUNTER — Ambulatory Visit (HOSPITAL_COMMUNITY)
Admission: RE | Admit: 2011-07-25 | Discharge: 2011-07-25 | Disposition: A | Discharge status: Home / Self Care | Payer: Medicare Other | Source: Ambulatory Visit | Attending: Nurse Practitioner | Admitting: Nurse Practitioner

## 2011-07-25 ENCOUNTER — Ambulatory Visit (HOSPITAL_BASED_OUTPATIENT_CLINIC_OR_DEPARTMENT_OTHER): Payer: Medicare Other | Admitting: Nurse Practitioner

## 2011-07-25 ENCOUNTER — Other Ambulatory Visit (HOSPITAL_BASED_OUTPATIENT_CLINIC_OR_DEPARTMENT_OTHER): Payer: Medicare Other | Admitting: Lab

## 2011-07-25 ENCOUNTER — Telehealth: Payer: Self-pay | Admitting: Oncology

## 2011-07-25 VITALS — BP 122/70 | HR 68 | Temp 97.0°F | Ht 61.0 in | Wt 98.2 lb

## 2011-07-25 DIAGNOSIS — R079 Chest pain, unspecified: Secondary | ICD-10-CM | POA: Insufficient documentation

## 2011-07-25 DIAGNOSIS — D473 Essential (hemorrhagic) thrombocythemia: Secondary | ICD-10-CM

## 2011-07-25 DIAGNOSIS — M4130 Thoracogenic scoliosis, site unspecified: Secondary | ICD-10-CM | POA: Insufficient documentation

## 2011-07-25 DIAGNOSIS — I82409 Acute embolism and thrombosis of unspecified deep veins of unspecified lower extremity: Secondary | ICD-10-CM

## 2011-07-25 DIAGNOSIS — S22009A Unspecified fracture of unspecified thoracic vertebra, initial encounter for closed fracture: Secondary | ICD-10-CM | POA: Insufficient documentation

## 2011-07-25 DIAGNOSIS — I82402 Acute embolism and thrombosis of unspecified deep veins of left lower extremity: Secondary | ICD-10-CM

## 2011-07-25 DIAGNOSIS — G8929 Other chronic pain: Secondary | ICD-10-CM

## 2011-07-25 DIAGNOSIS — M549 Dorsalgia, unspecified: Secondary | ICD-10-CM | POA: Insufficient documentation

## 2011-07-25 DIAGNOSIS — R0781 Pleurodynia: Secondary | ICD-10-CM

## 2011-07-25 DIAGNOSIS — I10 Essential (primary) hypertension: Secondary | ICD-10-CM

## 2011-07-25 DIAGNOSIS — W19XXXA Unspecified fall, initial encounter: Secondary | ICD-10-CM | POA: Insufficient documentation

## 2011-07-25 LAB — COMPREHENSIVE METABOLIC PANEL
ALT: 16 U/L (ref 0–35)
AST: 22 U/L (ref 0–37)
BUN: 20 mg/dL (ref 6–23)
Creatinine, Ser: 0.84 mg/dL (ref 0.50–1.10)
Total Bilirubin: 0.5 mg/dL (ref 0.3–1.2)

## 2011-07-25 LAB — PROTIME-INR: INR: 2.7 (ref 2.00–3.50)

## 2011-07-25 LAB — CBC WITH DIFFERENTIAL/PLATELET
BASO%: 0.6 % (ref 0.0–2.0)
EOS%: 1.3 % (ref 0.0–7.0)
LYMPH%: 19 % (ref 14.0–49.7)
MCH: 37.1 pg — ABNORMAL HIGH (ref 25.1–34.0)
MCHC: 34.4 g/dL (ref 31.5–36.0)
MCV: 107.7 fL — ABNORMAL HIGH (ref 79.5–101.0)
MONO%: 4.1 % (ref 0.0–14.0)
NEUT%: 75 % (ref 38.4–76.8)
Platelets: 405 10*3/uL — ABNORMAL HIGH (ref 145–400)
RBC: 3.58 10*6/uL — ABNORMAL LOW (ref 3.70–5.45)
WBC: 6.1 10*3/uL (ref 3.9–10.3)
nRBC: 0 % (ref 0–0)

## 2011-07-25 NOTE — Telephone Encounter (Signed)
Gave pt appt for Sept 24th 2013 lab and MD , date per pt's son due to the furniture market

## 2011-07-25 NOTE — Progress Notes (Signed)
OFFICE PROGRESS NOTE  Interval history:  Sherri Crawford is an 78 year old woman with essential thrombocythemia. She is maintained on hydroxyurea at a dose of 1000 mg on Mondays and 500 mg all other days. She also has a history of a left lower extremity DVT and is maintained on Coumadin. She is seen today for scheduled followup.  Sherri Crawford reports falling 1 week ago striking the right lower posterior ribs. She thinks she may have tripped over some shoes when returning from the bathroom during the night. She has stable chronic low back pain. She takes Vicodin 4 times daily. No nausea or vomiting. No mouth sores. She denies bleeding. No leg swelling or calf pain.   Objective: Blood pressure 122/70, pulse 68, temperature 97 F (36.1 C), temperature source Oral, height 5\' 1"  (1.549 m), weight 98 lb 3.2 oz (44.543 kg).  Oropharynx is without thrush or ulceration. Faint rales at both lung bases. Regular cardiac rhythm. Abdomen is soft and nontender. No organomegaly. Extremities are without edema. Calves are soft and nontender. She is tender over the right lower posterior ribs. No ecchymosis.  Lab Results: Lab Results  Component Value Date   WBC 6.1 07/25/2011   HGB 13.2 07/25/2011   HCT 38.5 07/25/2011   MCV 107.7* 07/25/2011   PLT 405* 07/25/2011    Chemistry:    Chemistry      Component Value Date/Time   NA 138 03/28/2011 1110   K 4.7 03/28/2011 1110   CL 105 03/28/2011 1110   CO2 24 03/28/2011 1110   BUN 25* 03/28/2011 1110   CREATININE 1.04 03/28/2011 1110      Component Value Date/Time   CALCIUM 9.6 03/28/2011 1110   ALKPHOS 44 03/28/2011 1110   AST 23 03/28/2011 1110   ALT 15 03/28/2011 1110   BILITOT 0.6 03/28/2011 1110       Studies/Results: No results found.  Medications: I have reviewed the patient's current medications.  Assessment/Plan:  1. Myeloproliferative disorder/essential thrombocythemia. Counts controlled on current dose of Hydrea. 2. Left lower remedy DVT. She is  maintained on long-term Coumadin anticoagulation. 3. Recent fall with subsequent pain at the right lower posterior ribs. 4. Asthma. 5. Degenerative arthritis of the spine. 6. Nonobstructive coronary artery disease. 7. Hyperlipidemia. 8. Essential hypertension.  Disposition-Sherri Crawford will continue Hydrea at the current dose. We will continue to check monthly CBCs. Coumadin is managed through the Coumadin clinic in our office. She will followup as scheduled. She has persistent pain at the right lower posterior ribs after a fall. We will refer her for plain x-ray evaluation. She will return for a followup visit in 4 months. She will contact the office in the interim with any problems.  [name] ANP/GNP-BC

## 2011-07-26 ENCOUNTER — Ambulatory Visit (HOSPITAL_BASED_OUTPATIENT_CLINIC_OR_DEPARTMENT_OTHER): Payer: Medicare Other | Admitting: Pharmacist

## 2011-07-26 DIAGNOSIS — I82409 Acute embolism and thrombosis of unspecified deep veins of unspecified lower extremity: Secondary | ICD-10-CM

## 2011-07-26 LAB — POCT INR: INR: 2.7

## 2011-07-26 NOTE — Progress Notes (Signed)
INR = 2.7 on 3 mg/day No missed doses.  No complaints. Cont same dose Repeat protime in 2 weeks. Marily Lente, Pharm.D.

## 2011-08-03 ENCOUNTER — Telehealth: Payer: Self-pay | Admitting: *Deleted

## 2011-08-03 NOTE — Telephone Encounter (Signed)
Notified the nurse, C. Gwendolyn Grant RN at [facility] to cont same dose of hydrea on pt & cont. cbc q month.

## 2011-08-08 ENCOUNTER — Ambulatory Visit (HOSPITAL_BASED_OUTPATIENT_CLINIC_OR_DEPARTMENT_OTHER): Payer: Medicare Other | Admitting: Pharmacist

## 2011-08-08 DIAGNOSIS — I82409 Acute embolism and thrombosis of unspecified deep veins of unspecified lower extremity: Secondary | ICD-10-CM

## 2011-08-08 LAB — POCT INR: INR: 2.7

## 2011-08-08 NOTE — Patient Instructions (Signed)
Slightly above goal at 2., but won't change dose.  Plan to continue at 3mg  daily and have Sherri Crawford recheck her INR in one month

## 2011-08-08 NOTE — Progress Notes (Signed)
Slightly above goal at 2.7, but won't change dose.  Plan to continue at 3mg  daily and have [place] recheck her INR in one month at [facility].

## 2011-08-28 ENCOUNTER — Telehealth: Payer: Self-pay | Admitting: Pulmonary Disease

## 2011-08-28 NOTE — Telephone Encounter (Signed)
PLease advise SN thanks 

## 2011-08-29 ENCOUNTER — Other Ambulatory Visit: Payer: Self-pay | Admitting: Pulmonary Disease

## 2011-08-29 DIAGNOSIS — E7800 Pure hypercholesterolemia, unspecified: Secondary | ICD-10-CM

## 2011-08-29 DIAGNOSIS — F411 Generalized anxiety disorder: Secondary | ICD-10-CM

## 2011-08-29 DIAGNOSIS — E559 Vitamin D deficiency, unspecified: Secondary | ICD-10-CM

## 2011-08-29 NOTE — Telephone Encounter (Signed)
Per SN---pt will need to come in for fasting labs---lip, tsh, vit d.   i have already put in the lab orders.  thanks

## 2011-08-29 NOTE — Telephone Encounter (Signed)
I spoke with pt and is aware labs have been placed in his computer. She had no questions

## 2011-09-05 ENCOUNTER — Ambulatory Visit (HOSPITAL_BASED_OUTPATIENT_CLINIC_OR_DEPARTMENT_OTHER): Payer: Medicare Other | Admitting: Pharmacist

## 2011-09-05 DIAGNOSIS — I82409 Acute embolism and thrombosis of unspecified deep veins of unspecified lower extremity: Secondary | ICD-10-CM

## 2011-09-05 LAB — POCT INR: INR: 2.3

## 2011-09-05 NOTE — Progress Notes (Signed)
INR = 2.3 on 3 mg/day Pt doing fine.  No complaints. No doses missed.  No med changes. Cont same dose & repeat INR in 1 month. Orders faxed to [facility].  Marily Lente, Pharm.D.

## 2011-09-08 ENCOUNTER — Ambulatory Visit: Payer: Medicare Other | Admitting: Pulmonary Disease

## 2011-09-12 ENCOUNTER — Telehealth: Payer: Self-pay | Admitting: Pulmonary Disease

## 2011-09-12 NOTE — Telephone Encounter (Signed)
Called, spoke with pt.  Advised labs are in.  She can come in tomorrow morning for this.  She is aware she will need to be fasting.

## 2011-09-13 ENCOUNTER — Other Ambulatory Visit: Payer: Self-pay | Admitting: Oncology

## 2011-09-13 ENCOUNTER — Other Ambulatory Visit (INDEPENDENT_AMBULATORY_CARE_PROVIDER_SITE_OTHER): Payer: Medicare Other

## 2011-09-13 DIAGNOSIS — E7800 Pure hypercholesterolemia, unspecified: Secondary | ICD-10-CM

## 2011-09-13 DIAGNOSIS — F411 Generalized anxiety disorder: Secondary | ICD-10-CM

## 2011-09-13 DIAGNOSIS — E559 Vitamin D deficiency, unspecified: Secondary | ICD-10-CM

## 2011-09-13 LAB — TSH: TSH: 2.1 u[IU]/mL (ref 0.35–5.50)

## 2011-09-13 LAB — LIPID PANEL: HDL: 62.8 mg/dL (ref >39.00)

## 2011-09-14 LAB — VITAMIN D 25 HYDROXY (VIT D DEFICIENCY, FRACTURES): Vit D, 25-Hydroxy: 51 ng/mL (ref 30–89)

## 2011-09-18 ENCOUNTER — Ambulatory Visit (INDEPENDENT_AMBULATORY_CARE_PROVIDER_SITE_OTHER): Payer: Medicare Other | Admitting: Pulmonary Disease

## 2011-09-18 ENCOUNTER — Encounter: Payer: Self-pay | Admitting: Pulmonary Disease

## 2011-09-18 VITALS — BP 138/76 | HR 64 | Temp 97.8°F | Ht 60.0 in | Wt 98.8 lb

## 2011-09-18 DIAGNOSIS — J449 Chronic obstructive pulmonary disease, unspecified: Secondary | ICD-10-CM

## 2011-09-18 DIAGNOSIS — M545 Low back pain, unspecified: Secondary | ICD-10-CM

## 2011-09-18 DIAGNOSIS — D126 Benign neoplasm of colon, unspecified: Secondary | ICD-10-CM

## 2011-09-18 DIAGNOSIS — I82402 Acute embolism and thrombosis of unspecified deep veins of left lower extremity: Secondary | ICD-10-CM

## 2011-09-18 DIAGNOSIS — M810 Age-related osteoporosis without current pathological fracture: Secondary | ICD-10-CM

## 2011-09-18 DIAGNOSIS — E7800 Pure hypercholesterolemia, unspecified: Secondary | ICD-10-CM

## 2011-09-18 DIAGNOSIS — I82409 Acute embolism and thrombosis of unspecified deep veins of unspecified lower extremity: Secondary | ICD-10-CM

## 2011-09-18 DIAGNOSIS — D473 Essential (hemorrhagic) thrombocythemia: Secondary | ICD-10-CM

## 2011-09-18 DIAGNOSIS — I10 Essential (primary) hypertension: Secondary | ICD-10-CM

## 2011-09-18 DIAGNOSIS — K219 Gastro-esophageal reflux disease without esophagitis: Secondary | ICD-10-CM

## 2011-09-18 DIAGNOSIS — M1990 Unspecified osteoarthritis, unspecified site: Secondary | ICD-10-CM

## 2011-09-18 DIAGNOSIS — Z8679 Personal history of other diseases of the circulatory system: Secondary | ICD-10-CM

## 2011-09-18 DIAGNOSIS — F411 Generalized anxiety disorder: Secondary | ICD-10-CM

## 2011-09-18 MED ORDER — MEGESTROL ACETATE 40 MG/ML PO SUSP: 200.0000 mg | Freq: Every day | ORAL | Status: DC

## 2011-09-18 NOTE — Progress Notes (Signed)
Subjective:    Patient ID: Sherri Crawford, female    DOB: 03-Feb-1921, 78 y.o.   MRN: [id number]  HPI 78 y/o WF here for a follow up visit... she has mult med problems including:  Asthmatic Bronchitis;  HBP;  CAD;  Hyperchol;  GERD/ Colon polyps;  DJD/ LBP/ Osteoporosis;  ?hx TIA;  Essential Thrombocytosis on Hydroxyurea per Sherri Crawford...  ~  July 30, 2009:  Hosp x2d by [facility] for what sounds like another NTG misadventure> transient syncopal spell after 1NTG for ?lower CP/ epig discomfort, then weak & had to crawl to bedroom...  Exam was unrevealing, EKG's w/o acute changes, Enz & labs= neg, 2DEcho w/ mild LVH, EF= 60-65%, no Sherri Crawford motion abn, gr1 DD, ?mass in RA?;  subseq TEE showed norm LV w/ EF= 60-65%, incr thickness of atrial septum c/w severe lipomatous hypertrophy (no mass), mod plaque in Ao... we discussed throwing out her NTG & using Pepcid AC, Mylanta liq, Tramadol for future episodes of discomfort... she has been following up w/ Sherri Crawford on Hydroxyurea> plat count down to 600K.  ~  May 23, 2010:  9 month ROV & review> she is c/o decr appetite and weight loss assoc w/ ?3wk hx swallowing difficulty that seems intermittent & occurs most often after she has eaten 1/2 of her lunch meal "it doesn't want to go down" & has on occas vomited it back up; she takes Nexium 40mg /d; Weights on office documented 103-105 over the last 6months in 2011, & dropped from 103 to 97# from 1/12 to today (6# wt loss in 71mo);  She was instructed to incr Ensure consumption & we discussed Megace trial of appetite & refer to GI for swallowing difficulty (NOTE: prev EGD 2008 showed GERD & esophagitis, &Colon showed tub adenoma)...    AB>  Stable on Advair, Singulair, Albuterol prn;  No recent exac- doing satis from the pulm standpoint...    HBP/ CAD>  On ASA & only takes 25mg  Metoprolol if BP>150 (this is how she likes to do it);  She had 2 NTG reactions & this was discontinued 5/11;  BP today = 100/50 w/ her wt loss,  no angina etc;  Last saw Sherri Crawford 1/12> stable, no changes, f/u 33yr...    Chol>  On Lip20 now & last FLP 4/11 on Lip10 reviewed;  She is not fasting today so we can't check FLP, wonder if we even need it now w/ her wt loss, but in view of her signif coronary & Ao calcif & ?prev TIA> rec continue Lip20 for now & f/u FLP when able...    GI>  See above...    DJD/ LBP/ Osteop> followed by Sherri Crawford on Lyrica, Hydrocodone, Celebrex prn;  Given last epid steroid shot 1/12, mild benefit;  NOTE: today's CXR shows new ant wedging T8 (~40%), last BMD 2008 showed TScore +0.4 in spine (it is not in [employer] EMR)...    Heme>  Followed by Sherri Crawford w/ Essential Thrombocytosis on Hydroxyurea (last note 1/12 reviewed- well controlled on 500mg /d x 1000mg  on Mondays w/ plats betw 218-365K)...  ~  Jul 13, 2010:  71mo ROV & post hosp check> GI eval 4/12 Sherri Crawford showed abn Ba Esophagram w/ diffuse motility disorder & narrowing of distal esoph, plus laryngeal penetration; subseq EGD by Sherri Crawford showed a presbyesoph & spasm in the distal esoph w/o stricture, Savory dilatation done, mult sm gastric polyps noted...  She was Adm 5/4 - 07/03/10 w/ left leg DVT, unprovoked (hypercoag panel- pending), w/ hx underlying  essential thrombocytosis on Hydroxyurea from Sherri Crawford;  Started on Coumadin w/ Lovenox bridge & early disch w/ protimes via visiting nurse & called to Sherri Crawford who already checks CBC monthly...  Feeling better since the hosp, denies CP/ palpit/ SOB, & swelling in left leg is diminished- just mild now & she knows to elim salt, elevate leg, wear support hose...  ~  November 16, 2010:  3mo ROV & she continues stable w/ severe underlying multisys pathology as noted>>    COPD/ AB> She was seen by Sherri Crawford 8/12 & he stopped her Singulair & decreased her Advair to 100/50 Bid, but since then she has had to increase her Proair use to 3-4 times daily;     Cards> HBP, CAD, Hx DVT> on Metoprolol 25mg Bid & BP= 126/82; she  reports feeling ok & denies CP, palpit, ch in SOB, edema, etc; she also remains on Coumadin with Protimes monitored in DrGranfortunas office...    CHOL> on Lip20 but he last FLP was over 21yr ago & she forgets to come fasting for f/u lab here, we discussed this again, see below...    GI> GERD, Polyps> on Nexium 40mg /d + Ensure & Megace for appetite & wt gain- now at 105#; followed by Sherri Crawford for GI> his notes are reviewed...    DJD, Osteoporosis> on calcium, MVI, Vit D 1000 u daily; she has mod diffuse DJD & uses Tylenol as needed...    Thrombocythemia> she is followed by Sherri Crawford on Hydrea & his note of 7/12 is reviewed w/ platelets counts betw 275-359...  ~  March 21, 2011:  3mo ROV & she is here for routine follow up> reports doing well w/o new complaints or concerns today... She lives at [facility] & has been walking daily to the AL unit to see husb who fell w/ hip fx ~23mo ago; we discussed exercise/ rehab/ etc.    She saw Sherri Crawford for Cards f/u 1/13> Hx CAD- no CP, palpit, dizziness, etc; also on Coumadin for hx DVT & thrombocytosis- followed by Sherri Crawford; stable & no changes made...    Labs & Meds reviewed w/ pt> Protimes ok per Sherri Crawford;  CBC 11/12- Hg=12.1 & Plat=433 on her Hydrea Rx;  Chems- all ok...  ~  September 18, 2011:  32mo ROV & Sherri Crawford reports doing satis w/ her CC= LBP & she has f/u appt w/ Sherri Crawford soon on Norco10 as needed...    COPD/ AB> as noted Sherri Crawford prev decreased her Advair to 100/50 qd & stopped her Singulair, but she had to incr the Proair rescue to 3-4x daily; now back on Advair100Bid & the Singulair10mg /d; her rescue inhaler use is down to 1-2x daily & she is comfortable w/ this...    Cards> HBP, CAD, Hx DVT> on Metoprolol 25mg Bid & BP= 138/76; she reports feeling ok & denies CP, palpit, ch in SOB, edema, etc; she also remains on Coumadin with Protimes drawn at [facility] & monitored in DrGranfortunas office...    CHOL> on Lip20 & her FLP looks good w/ TChol 169,  TG 137, HDL 63, LDL 79...    GI> GERD, Polyps> on Nexium 40mg /d + Ensure & Megace for appetite & wt gain- now at 99# but she admits to not taking the supplements regularly; followed by Sherri Crawford for GI...    DJD, Osteoporosis> on calcium, MVI, Vit D 1000 u daily; she has mod diffuse DJD & uses NORCO10 & Tylenol as needed...    Thrombocythemia> she is followed by Sherri Crawford on Hydrea & his notes are reviewed  w/ platelets counts holding at 400K... We reviewed prob list, meds, xrays and labs> see below for updates >> LABS 5/13 by DrG:  Chems- wnl;  CBC- wnl w/ Plat=405K LABS 7/13:  FLP- at goals on Lip20;  TSH=2.10;  VitD=51          Problem List:  MACULAR DEGENERATION (ICD-362.50) - sees [name] for follow up- on Vitamin therapy...  ASTHMATIC BRONCHITIS & COPD - she was stable on Advair250 + Singulair10 w/ Prn albuterol, but 8/12 Sherri Crawford stopped her Singulair & cut the ADVAIR to 100/50Bid; since then she reports that she's had to increase the Proair use to 3-4 times daily but she does not want to go back up on the Advair dose;  she still takes allergy shots every 2weeks at [facility]. ~  7/13:  Stable on Advair100Bid & back on Singulair10mg /d; using Proair rescue 1-2 times daily & she wants to continue as is...  HYPERTENSION,  CORONARY ARTERY DISEASE,  RBBB - on METOPROLOL 25mg  Bid & off ASA mow that she's on Coumadin... she has known non-obstructive CAD and followed by Sherri Crawford... she was prev on Dyazide but stopped this med... ~  Nuclear Stress Test 9/09 was normal- no infarct, no ischemia, EF= 74%... ~  3/11 & 5/11 >> 2 episodes of NTG reactions (see above) therefore NTG discontinued... ~  3/12:  BP= 110/70 & she takes METOPROLOL 25mg  if her BP is > 150 (this is how she likes to do it); denies HA, fatigue, visual changes, recurrent CP, palipit, dizziness, syncope, dyspnea, edema, etc... ~  5/12 Hosp w/ left leg DVT> CXR showed norm heart size, clear lungs x atx at bases, old T8 compression;  EKG  showed SBrady, RBBB; Coumadin started & ASA discontinued. ~  5/12:  BP= 118/60 & she is essent asymptomatic now... ~  9/12:  BP= 126/82 & stable on Metop25Bid + diet etc... ~  1/13:  BP= 108/64 & she remains essent asymptomatic... ~  7/13:  BP= 138/76 & she denies CP, palpit, dizzy, SOB, edema, etc...  DVT>  On Coumadin started 5/12 Hosp & protimes monitored by Sherri Crawford due to this & essential thrombocytosis...  HYPERCHOLESTEROLEMIA - on diet + LIPITOR 20mg /d now... tolerated well...  ~  FLP 1/08 showed TChol 198, TG 129, HDL 59, LDL 113... ~  FLP 5/09 showed TChol 168, TG 100, HDL 49, LDL 99... ~  FLP 4/10 on Lip10 showed TChol 183, TG 117, HDL 50, LDL 109... rec> same med, better diet. ~  FLP 4/11 on Lip10 showed TChol 223, Tg 87, HDL 62, LDL 152... rec incr Lip20. ~  FLP 7/13 on Lip20 showed TChol 169, TG 137, HDL 63, LDL 79...   GERD - on NEXIUM 40mg /d... ~  Sherri Crawford did an EGD 4/08 showing GERD, esophagitis and Rx w/ NEXIUM 40/d (it helps)... ~  EGD 4/12 by Sherri Crawford showed a presbyesoph & spasm in the distal esoph w/o stricture, Savory dilatation done, mult sm gastric polyps noted...    COLONIC POLYPS - last colonoscopy was 4/08 by Sherri Crawford showing 3mm polyp= tubular adenoma...   DEGENERATIVE JOINT DISEASE (ICD-715.90) BACK PAIN, LUMBAR - she has signif Back Pain and is followed in the pain clinic (Sherri Crawford now & he has her on LORCET-HD 10/500 taking 1Qid); off prev Lyrica, Celebrex, Tramadol...  OSTEOPOROSIS (ICD-733.00) - prev on Boniva 150mg /mo + Calcium & Vitamins... ~  BMD here 5/01 showed TScores -0.1 in Spine, & -1.4 in left FemNeck ~  BMD here 7/08 showed TScores +0.4 in Spine, & -  1.6 in left FemNeck...  ~  labs 4/10 showed Vit D level = 34... rec> take Vit D 1000 u OTC daily. ~  9/12:  Her Boniva was stopped in the interim & she is not sure when (likely during her hosp)> rec to continue calcium, MVI, Vit D & needs f/u BMD.  TRANSIENT ISCHEMIC ATTACKS, HX OF - she has  sm vessel disease on prev scans... Prev on 81mg  ECASA daily but this was stopped when she started on the Coumadin for DVT. ~  3/11: went to ER w/ episode of CP, s/p 3 NTG, developed left arm weakness, no speech or leg symptoms... she was off ASA for several days in anticipation of shot from Sherri Crawford... ? NTG related, ?off ASA related, ?need for Plavix... decision made to stay on ASA, limit NTG. ~  5/12:  Adm w/ DVT & placed on Hep/ Coumadin therefore ASA was discontinued; Protimes done at [facility] & coordinated thru Sherri Crawford's office...  ANXIETY (ICD-300.00) - not currently on medication...  ESSENTIAL THROMBOCYTHEMIA (ICD-238.71) - noted to have rising platelet count & referred to [facility] 4/11 w/ Dx of Essential Thrombocytosis/ myeloproliferative disorder & started on HYDROXYUREA 500mg /d w/ titration from DrG... ~  labs 4/11 showed platlet count = 994K... Hydroxyurea started by Heme. ~  labs 5/11 showed platelet count = 602K ~  Labs 3/12 showed Hg= 12.2 (MCV=118), WBC= 5.7, Plat=294K ~  Labs in hosp 5/12 showed Hg down to 10.7, MCV= 116... ~  Sherri Crawford continues to monitor labs & protimes monthly thru his clinic... ~  EPIC labs reviewed & 11/12 Hg=12.1 and Plat= 433K... ~  7/13:  EPIC reviewed & pt on stable dose of HYDREA w/ Platlets ~400K...   Past Surgical History  Procedure Date  . Appendectomy   . Cholecystectomy   . Tah and bso 08/21/1995    Outpatient Encounter Prescriptions as of 09/18/2011  Medication Sig Dispense Refill  . albuterol (PROAIR HFA) 108 (90 BASE) MCG/ACT inhaler Inhale 2 puffs into the lungs every 6 (six) hours as needed for wheezing. As needed for wheezing  1 Inhaler  11  . atorvastatin (LIPITOR) 20 MG tablet Take 1 tablet (20 mg total) by mouth daily.  30 tablet  4  . Cholecalciferol (VITAMIN D3) 1000 UNITS CAPS Take 1 capsule by mouth daily.        Marland Kitchen EPIPEN 2-PAK 0.3 MG/0.3ML DEVI as needed.      Marland Kitchen esomeprazole (NEXIUM) 40 MG capsule Take 1  capsule (40 mg total) by mouth daily before breakfast.  30 capsule  11  . Fluticasone-Salmeterol (ADVAIR) 100-50 MCG/DOSE AEPB Inhale 1 puff into the lungs every 12 (twelve) hours.        Marland Kitchen HYDROcodone-acetaminophen (NORCO) 10-325 MG per tablet Take 1 tablet by mouth every 6 (six) hours as needed.      . hydroxyurea (HYDREA) 500 MG capsule TAKE 2 CAPSULES BY MOUTH ON MONDAY (1000MG ) AND THEN TAKE 1 CAPSULE BY MOUTH EVERY OTHER DAY (500MG )  34 capsule  10  . megestrol (MEGACE) 40 MG/ML suspension Take by mouth Twice daily.      . metoprolol tartrate (LOPRESSOR) 25 MG tablet Take 1 tablet (25 mg total) by mouth 2 (two) times daily.  60 tablet  4  . montelukast (SINGULAIR) 10 MG tablet TAKE 1 TABLET BY MOUTH ONCE A DAY  30 tablet  4  . Multiple Vitamin (MULTIVITAMIN PO) Take 1 tablet by mouth daily.        . nitroGLYCERIN (NITROSTAT) 0.4  MG SL tablet Place 0.4 mg under the tongue every 5 (five) minutes as needed.        . Nutritional Supplements (ENSURE ENLIVE) LIQD Take 1 Can by mouth daily.       Marland Kitchen warfarin (COUMADIN) 3 MG tablet TAKE 1 TABLET BY MOUTH  DAILY OR TAKE AS DIRECTED BY MD  30 tablet  0    Allergies  Allergen Reactions  . Sulfonamide Derivatives     Pt is unsure of reaction    Current Medications, Allergies, Past Medical History, Past Surgical History, Family History, and Social History were reviewed in [employer] record.    Review of Systems        See HPI - all other systems neg except as noted... The patient complains of decreased hearing and dyspnea on exertion.  The patient denies anorexia, fever, weight loss, weight gain, vision loss, hoarseness, chest pain, syncope, peripheral edema, prolonged cough, headaches, hemoptysis, abdominal pain, melena, hematochezia, severe indigestion/heartburn, hematuria, incontinence, muscle weakness, suspicious skin lesions, transient blindness, difficulty walking, depression, unusual weight change, abnormal bleeding,  enlarged lymph nodes, and angioedema.     Objective:   Physical Exam     WD, Thin, 78 y/o WF in NAD... GENERAL:  Alert & oriented; pleasant & cooperative... HEENT:  [place]/AT, EOM-wnl, PERRLA, EACs-clear, TMs-wnl, NOSE-clear, THROAT-clear & wnl. NECK:  Supple w/ fairROM; no JVD; normal carotid impulses w/o bruits; no thyromegaly or nodules palpated; no lymphadenopathy. CHEST:  Decr BS bilat & clear to P & A; without wheezes/ rales/ or rhonchi heard... HEART:  Regular Rhythm; without murmurs/ rubs/ or gallops detected...  ABDOMEN:  Thin, soft & nontender; normal bowel sounds; no organomegaly or masses palpated... EXT: without deformities, mild arthritic changes; no varicose veins/ +venous insuffic/ no edema. BACK:  kyphosis without focal tenderness... NEURO:  CN's intact; no focal neuro deficits; gait abn due to LBP... DERM:  No lesions noted; no rash etc...  RADIOLOGY DATA:  Reviewed in the EPIC EMR & discussed w/ the patient...  LABORATORY DATA:  Reviewed in the EPIC EMR & discussed w/ the patient...   Assessment & Plan:   DVT>  She is on Coumadin w/ protimes by visiting nurses & called to Sherri Crawford who will be monitoring this going forward since he already monitors her Hydroxyurea for the thrombocytosis...  Essential Thrombocytosis>  As above, stable on Hydroxyurea, monitored by DrG...  COPD>  She feels she is stable on the Advair100 & Singulair;  Using Proair rescue 1-2 times daily & she is comfortable w/ this...  HBP/ CAD>  Stable on Metoprolol, no angina etc...  CHOL>  FLP on the Lip20 looks good- continue same...  DJD/ Osteoporosis>  She is followed by Sherri Crawford; also needs f/u BMD when able...  Other medical problems as noted...   Patient's Medications  New Prescriptions   No medications on file  Previous Medications   ALBUTEROL (PROAIR HFA) 108 (90 BASE) MCG/ACT INHALER    Inhale 2 puffs into the lungs every 6 (six) hours as needed for wheezing. As needed for  wheezing   ATORVASTATIN (LIPITOR) 20 MG TABLET    Take 1 tablet (20 mg total) by mouth daily.   CHOLECALCIFEROL (VITAMIN D3) 1000 UNITS CAPS    Take 1 capsule by mouth daily.     EPIPEN 2-PAK 0.3 MG/0.3ML DEVI    as needed.   ESOMEPRAZOLE (NEXIUM) 40 MG CAPSULE    Take 1 capsule (40 mg total) by mouth daily before breakfast.  FLUTICASONE-SALMETEROL (ADVAIR) 100-50 MCG/DOSE AEPB    Inhale 1 puff into the lungs every 12 (twelve) hours.     HYDROCODONE-ACETAMINOPHEN (NORCO) 10-325 MG PER TABLET    Take 1 tablet by mouth every 6 (six) hours as needed.   HYDROXYUREA (HYDREA) 500 MG CAPSULE    TAKE 2 CAPSULES BY MOUTH ON MONDAY (1000MG ) AND THEN TAKE 1 CAPSULE BY MOUTH EVERY OTHER DAY (500MG )   METOPROLOL TARTRATE (LOPRESSOR) 25 MG TABLET    Take 1 tablet (25 mg total) by mouth 2 (two) times daily.   MONTELUKAST (SINGULAIR) 10 MG TABLET    TAKE 1 TABLET BY MOUTH ONCE A DAY   MULTIPLE VITAMIN (MULTIVITAMIN PO)    Take 1 tablet by mouth daily.     NITROGLYCERIN (NITROSTAT) 0.4 MG SL TABLET    Place 0.4 mg under the tongue every 5 (five) minutes as needed.     NUTRITIONAL SUPPLEMENTS (ENSURE ENLIVE) LIQD    Take 1 Can by mouth daily.    WARFARIN (COUMADIN) 3 MG TABLET    TAKE 1 TABLET BY MOUTH  DAILY OR TAKE AS DIRECTED BY MD  Modified Medications   Modified Medication Previous Medication   MEGESTROL (MEGACE) 40 MG/ML SUSPENSION megestrol (MEGACE) 40 MG/ML suspension      Take 5 mLs (200 mg total) by mouth daily.    Take by mouth Twice daily.  Discontinued Medications   No medications on file

## 2011-09-18 NOTE — Patient Instructions (Addendum)
Today we updated your med list in our EPIC system...    Continue your current medications the same...    We refilled the meds you requested...  Call for any problems or if we can be of service in any way...  Let's plan a follow up visit in 6 months.Marland KitchenMarland Kitchen

## 2011-09-26 ENCOUNTER — Other Ambulatory Visit: Payer: Self-pay | Admitting: Pulmonary Disease

## 2011-10-03 ENCOUNTER — Ambulatory Visit: Payer: Self-pay | Admitting: Pharmacist

## 2011-10-03 DIAGNOSIS — I82409 Acute embolism and thrombosis of unspecified deep veins of unspecified lower extremity: Secondary | ICD-10-CM

## 2011-10-03 NOTE — Progress Notes (Signed)
Spoke to pt on phone.  No changes in medication.  No other changes to report.  Will check PT/INR in 1 month at [facility].  Orders faxed to [facility].

## 2011-10-16 ENCOUNTER — Encounter: Payer: Self-pay | Admitting: Oncology

## 2011-10-24 ENCOUNTER — Other Ambulatory Visit: Payer: Self-pay | Admitting: Pulmonary Disease

## 2011-10-25 ENCOUNTER — Other Ambulatory Visit: Payer: Self-pay | Admitting: Pulmonary Disease

## 2011-10-31 ENCOUNTER — Ambulatory Visit (HOSPITAL_BASED_OUTPATIENT_CLINIC_OR_DEPARTMENT_OTHER): Payer: Medicare Other | Admitting: Pharmacist

## 2011-10-31 DIAGNOSIS — I82409 Acute embolism and thrombosis of unspecified deep veins of unspecified lower extremity: Secondary | ICD-10-CM

## 2011-10-31 NOTE — Patient Instructions (Signed)
Continue 3 mg daily and recheck INR on 10/4 at [facility]

## 2011-10-31 NOTE — Progress Notes (Signed)
Continue 3 mg daily and recheck in 1 month. 

## 2011-11-08 ENCOUNTER — Other Ambulatory Visit: Payer: Self-pay | Admitting: Oncology

## 2011-11-21 ENCOUNTER — Ambulatory Visit (HOSPITAL_BASED_OUTPATIENT_CLINIC_OR_DEPARTMENT_OTHER): Payer: Medicare Other | Admitting: Oncology

## 2011-11-21 ENCOUNTER — Other Ambulatory Visit (HOSPITAL_BASED_OUTPATIENT_CLINIC_OR_DEPARTMENT_OTHER): Payer: Medicare Other | Admitting: Lab

## 2011-11-21 ENCOUNTER — Telehealth: Payer: Self-pay | Admitting: Oncology

## 2011-11-21 DIAGNOSIS — I82402 Acute embolism and thrombosis of unspecified deep veins of left lower extremity: Secondary | ICD-10-CM

## 2011-11-21 DIAGNOSIS — D473 Essential (hemorrhagic) thrombocythemia: Secondary | ICD-10-CM

## 2011-11-21 DIAGNOSIS — Z7901 Long term (current) use of anticoagulants: Secondary | ICD-10-CM

## 2011-11-21 DIAGNOSIS — I82409 Acute embolism and thrombosis of unspecified deep veins of unspecified lower extremity: Secondary | ICD-10-CM

## 2011-11-21 LAB — COMPREHENSIVE METABOLIC PANEL (CC13)
AST: 20 U/L (ref 5–34)
Albumin: 3.8 g/dL (ref 3.5–5.0)
BUN: 27 mg/dL — ABNORMAL HIGH (ref 7.0–26.0)
Calcium: 9.6 mg/dL (ref 8.4–10.4)
Chloride: 107 mEq/L (ref 98–107)
Glucose: 139 mg/dl — ABNORMAL HIGH (ref 70–99)
Potassium: 4.2 mEq/L (ref 3.5–5.1)
Sodium: 139 mEq/L (ref 136–145)
Total Protein: 6.4 g/dL (ref 6.4–8.3)

## 2011-11-21 LAB — CBC WITH DIFFERENTIAL/PLATELET
Basophils Absolute: 0.1 10*3/uL (ref 0.0–0.1)
Eosinophils Absolute: 0.1 10*3/uL (ref 0.0–0.5)
HGB: 12.8 g/dL (ref 11.6–15.9)
LYMPH%: 22 % (ref 14.0–49.7)
MCV: 107.7 fL — ABNORMAL HIGH (ref 79.5–101.0)
MONO#: 0.3 10*3/uL (ref 0.1–0.9)
MONO%: 4 % (ref 0.0–14.0)
NEUT#: 5.8 10*3/uL (ref 1.5–6.5)
Platelets: 375 10*3/uL (ref 145–400)

## 2011-11-21 LAB — PROTIME-INR: INR: 4.3 — ABNORMAL HIGH (ref 2.00–3.50)

## 2011-11-21 NOTE — Progress Notes (Signed)
Hematology and Oncology Follow Up Visit  CHARLOTTEROSE NISSEN [id number] 01-13-1921 78 y.o. 11/21/2011 7:09 PM   Principle Diagnosis: Encounter Diagnoses  Name Primary?  . Essential thrombocythemia Yes  . DVT (deep venous thrombosis)   . Chronic anticoagulation      Interim History:   Followup visit for this pleasant 78 year old woman with essential thrombocythemia well-controlled on low dose Hydrea 500 mg daily except 1000 mg on Mondays.  She sustained a spontaneous DVT of her left lower extremity in May 2012 presumably related to her underlying myeloproliferative disorder. She was started on Coumadin anticoagulation and continues on the same. We have had a difficult time adjusting her INR. I thought we finally had her on a stable dose of 3 mg but INR today is supratherapeutic at 4.3. I'm going to have her hold 2 doses and then resume at 2.5 mg daily.  Despite her advanced age she is still in a independent living situation at [facility]. Her son accompanies her today. She's had no interim problems. She denies any headache or change in vision, no chest pain, no dyspnea, no change in bowel habit, she does have difficulty getting around.    Medications: reviewed  Allergies:  Allergies  Allergen Reactions  . Sulfonamide Derivatives     Pt is unsure of reaction    Review of Systems: Constitutional:   No constitutional symptoms Respiratory: See above Cardiovascular:  See above Gastrointestinal: See above Genito-Urinary: Not questioned Musculoskeletal: Not questioned Neurologic: See above Skin: No rash Remaining ROS negative.  Physical Exam: There were no vitals taken for this visit. Wt Readings from Last 3 Encounters:  09/18/11 98 lb 12.8 oz (44.815 kg)  07/25/11 98 lb 3.2 oz (44.543 kg)  03/28/11 101 lb 9.6 oz (46.085 kg)     General appearance: Petite elderly woman HENNT: Pharynx no erythema or exudate Lymph nodes: No adenopathy Breasts: Not examined Lungs: Clear to  auscultation resonant to percussion Heart: Regular rhythm no murmur Abdomen: Soft nontender no mass no organomegaly Extremities: No edema no calf tenderness Vascular: No cyanosis Neurologic: She is hard of hearing. Motor strength is 5 over 5. Reflexes absent symmetric at the knees 1+ symmetric at the biceps. Pupils are pinpoint so I could not see her fundi Skin: No rash or ecchymosis  Lab Results: Lab Results  Component Value Date   WBC 8.1 11/21/2011   HGB 12.8 11/21/2011   HCT 38.2 11/21/2011   MCV 107.7* 11/21/2011   PLT 375 11/21/2011     Chemistry      Component Value Date/Time   NA 139 11/21/2011 1330   NA 140 07/25/2011 1130   K 4.2 11/21/2011 1330   K 4.5 07/25/2011 1130   CL 107 11/21/2011 1330   CL 103 07/25/2011 1130   CO2 23 11/21/2011 1330   CO2 31 07/25/2011 1130   BUN 27.0* 11/21/2011 1330   BUN 20 07/25/2011 1130   CREATININE 1.1 11/21/2011 1330   CREATININE 0.84 07/25/2011 1130      Component Value Date/Time   CALCIUM 9.6 11/21/2011 1330   CALCIUM 9.4 07/25/2011 1130   ALKPHOS 46 11/21/2011 1330   ALKPHOS 50 07/25/2011 1130   AST 20 11/21/2011 1330   AST 22 07/25/2011 1130   ALT 16 11/21/2011 1330   ALT 16 07/25/2011 1130   BILITOT 0.50 11/21/2011 1330   BILITOT 0.5 07/25/2011 1130       Impression and Plan:  #1. Myeloproliferative disorder/essential thrombocythemia  Counts controlled on current dose of  Hydrea. Plan continue the same. Continue monthly lab monitoring.  #2. Left lower extremity DVT.  Due to her underlying myeloproliferative disorder and her age she will be kept on long-term Coumadin anticoagulation. We will continue to monitor her protimes in our office Coumadin clinic.  #3. Asthma  #4. Degenerative arthritis of the spine  #5. Nonobstructive coronary artery disease.  #6. Hyperlipidemia.  #7. Essential hypertension   CC:. Dr. [name]; Dr. Valera Castle  Levert Feinstein, MD 9/24/20137:09 PM

## 2011-11-21 NOTE — Telephone Encounter (Signed)
appts made and printed for pt aom °

## 2011-11-22 IMAGING — CT CT HEAD W/O CM
1 of 2 series · 16 of 30 positions shown, 20 images · non-contrast
Comparison: 05/09/2009.

CLINICAL DATA: Syncopal episode yesterday. Hit head.

CT HEAD WITHOUT CONTRAST
TECHNIQUE: Contiguous axial images were obtained from the base of
the skull through the vertex without contrast.

[Series 3: recon 2: brain · axial · 0.49mm/px · z∈[+126,+262]mm · 16 of 88 slices shown, 20 images]
[im 5/88  brain]
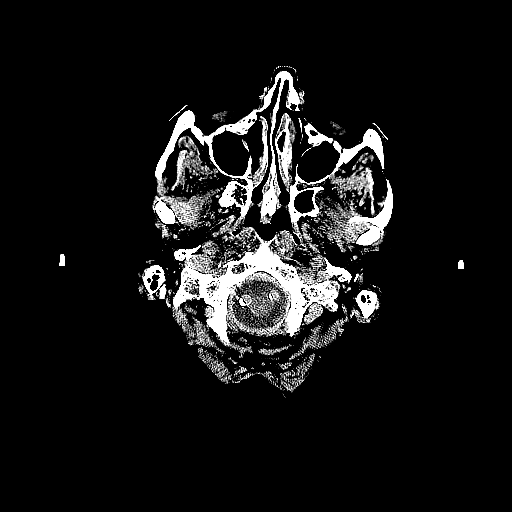
[im 5/88  bone]
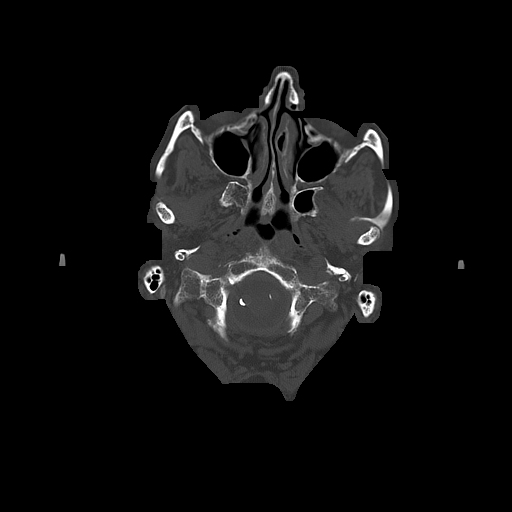
[im 10/88  brain]
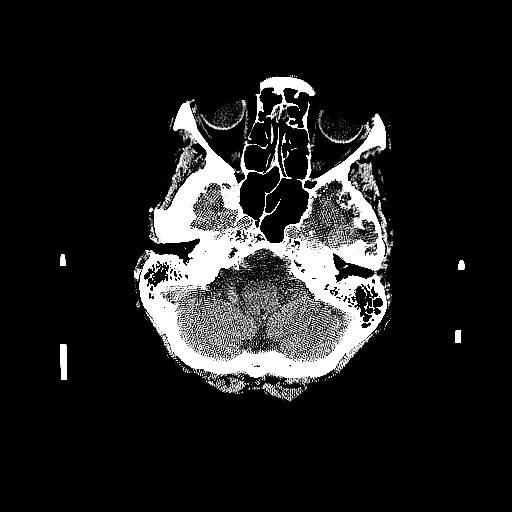
[im 14/88  brain]
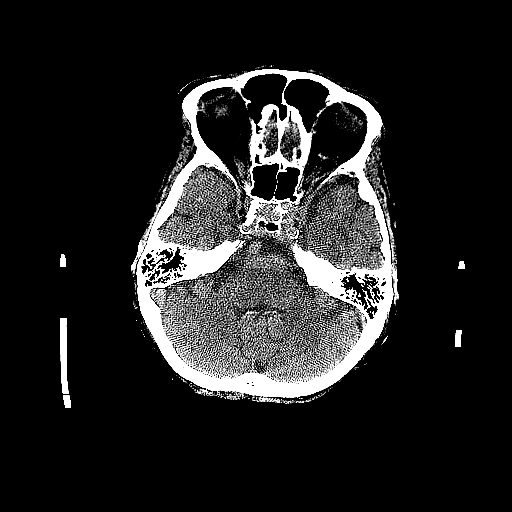
[im 19/88  brain]
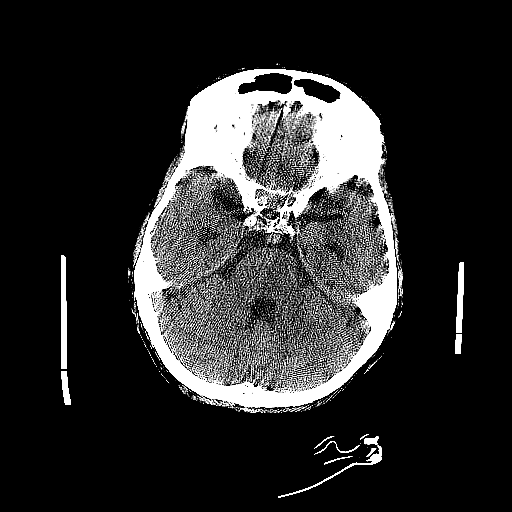
[im 28/88  brain]
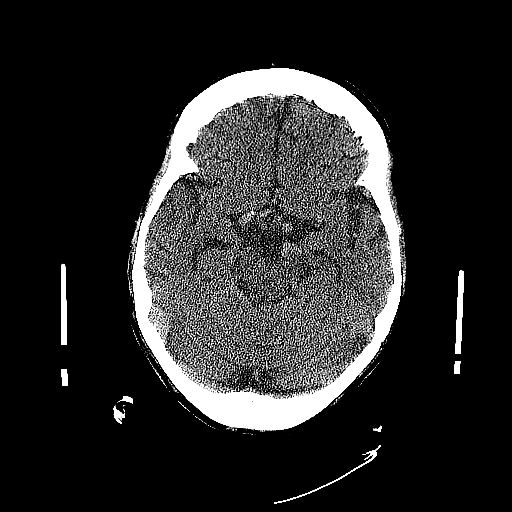
[im 28/88  bone]
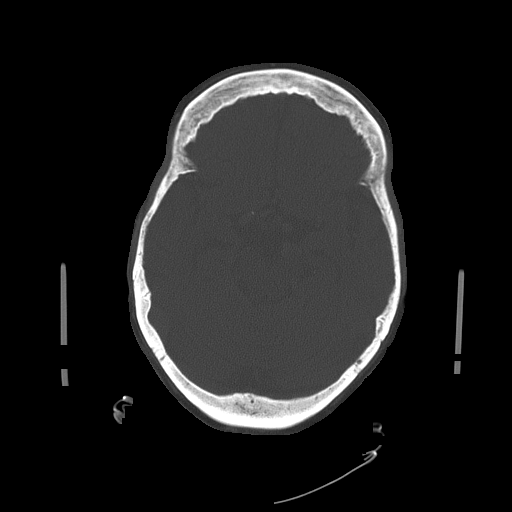
[im 33/88  brain]
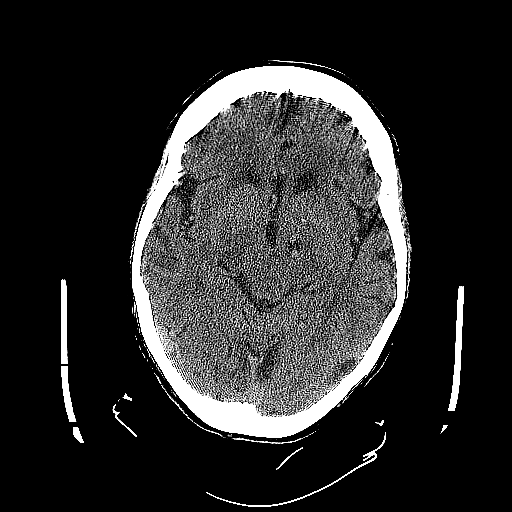
[im 37/88  brain]
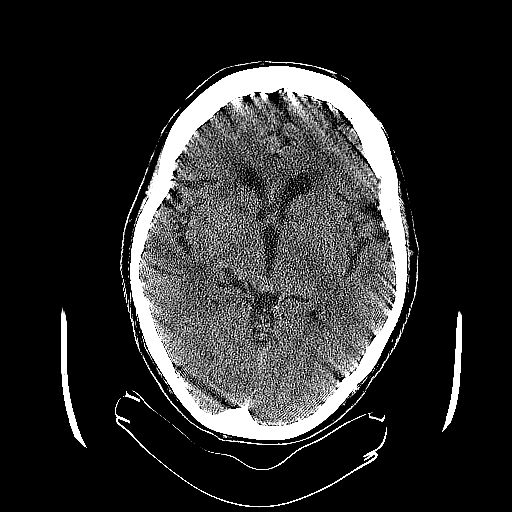
[im 42/88  brain]
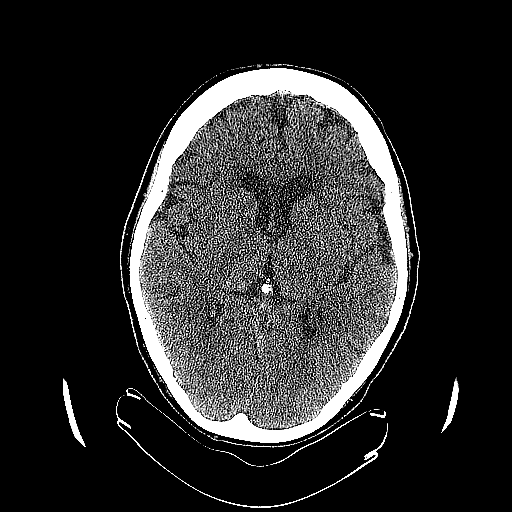
[im 46/88  brain]
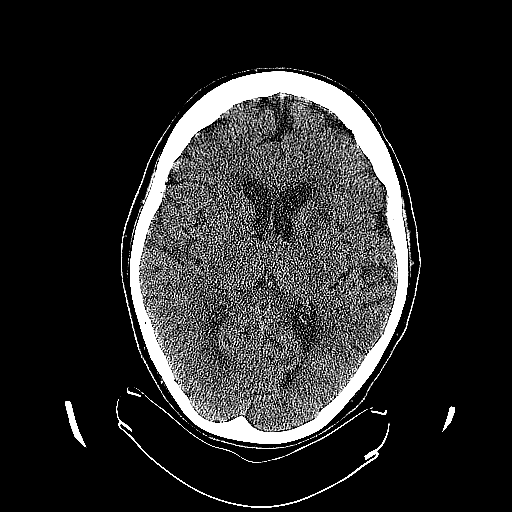
[im 46/88  bone]
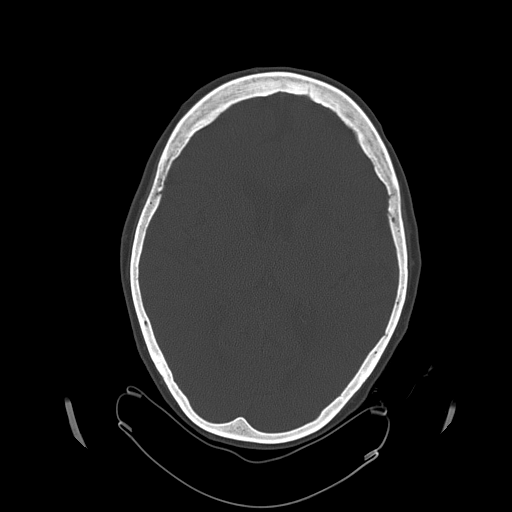
[im 51/88  brain]
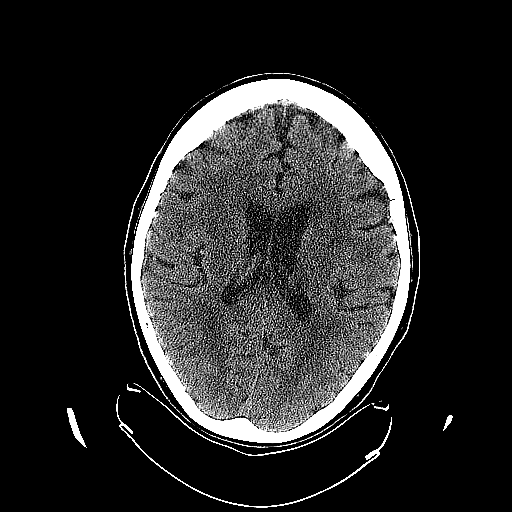
[im 55/88  brain]
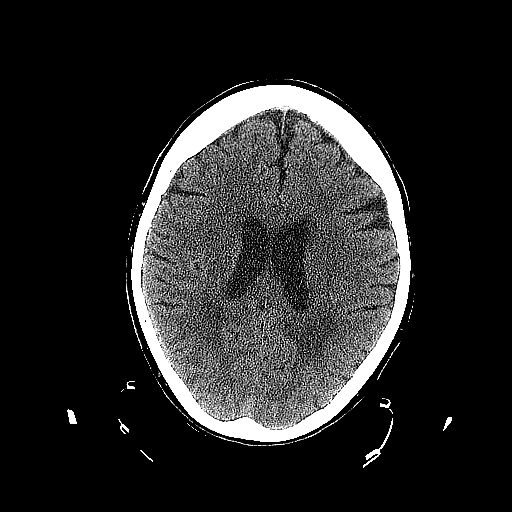
[im 60/88  brain]
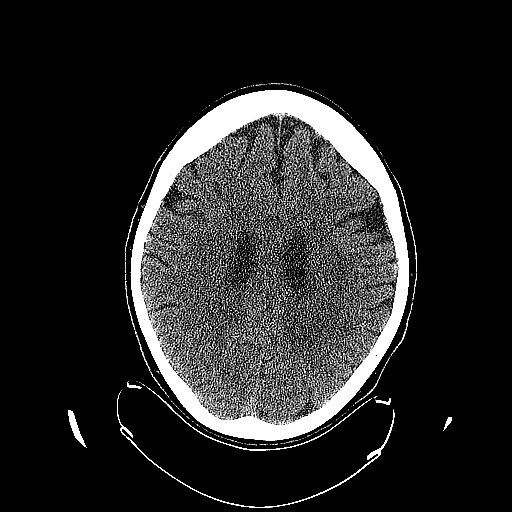
[im 69/88  brain]
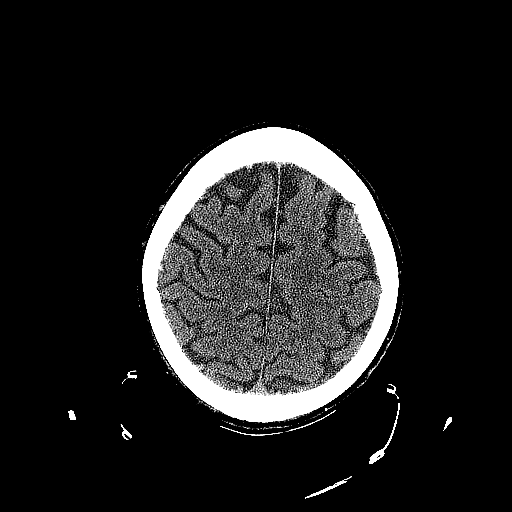
[im 69/88  bone]
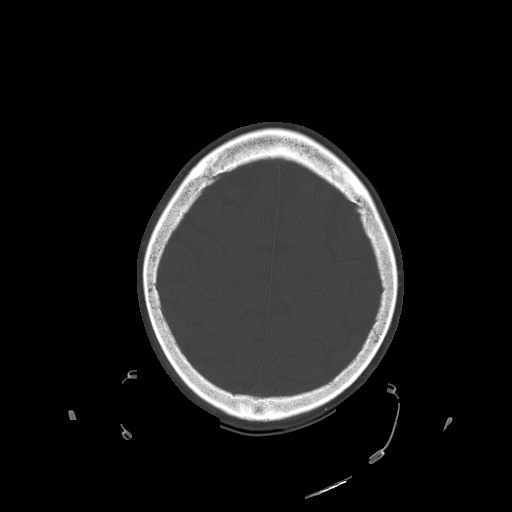
[im 74/88  brain]
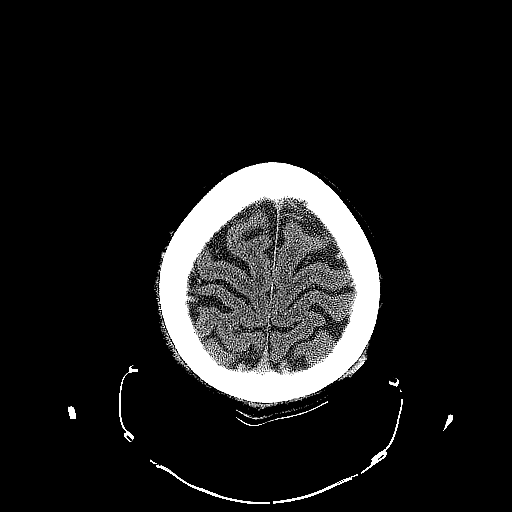
[im 78/88  brain]
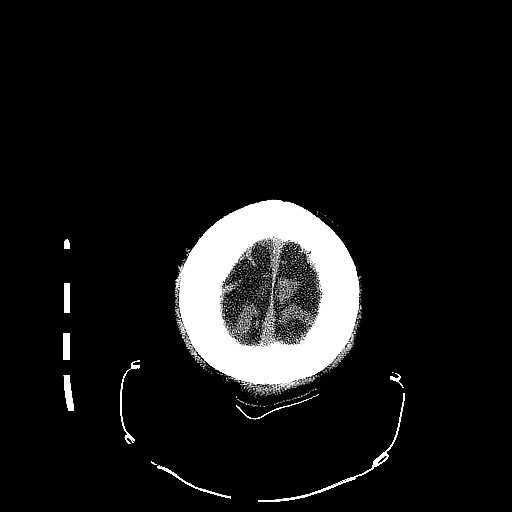
[im 83/88  brain]
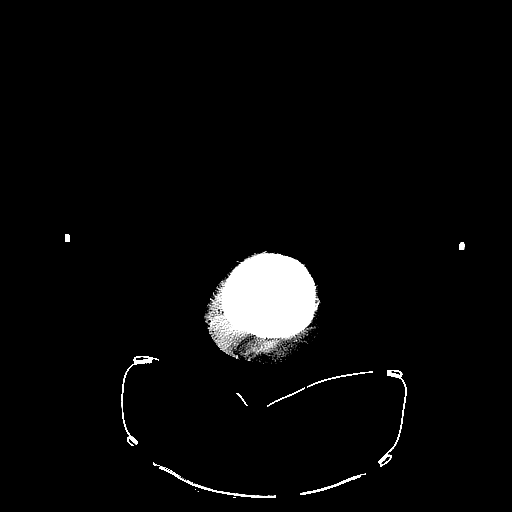

[16 of 30 positions shown; findings below may reference images not displayed]

FINDINGS: Subcutaneous hematoma right superior scalp without
underlying fracture or intracranial hemorrhage.  Moderate small
vessel disease type changes. No CT evidence of large acute infarct.
Small acute infarct cannot be excluded by CT. No intracranial mass
detected on this unenhanced exam.  Prominent vascular
calcifications and ectasia.
IMPRESSION: Subcutaneous hematoma right superior scalp without underlying
fracture or intracranial hemorrhage.

Moderate small vessel disease type changes.

## 2011-11-22 IMAGING — CR DG CHEST 2V
2 series · 2 of 2 positions shown · non-contrast
Comparison: Plain films chest of 12/01/2003.

CLINICAL DATA: Chest pain.  Syncope.

CHEST - 2 VIEW

[w chest pa]
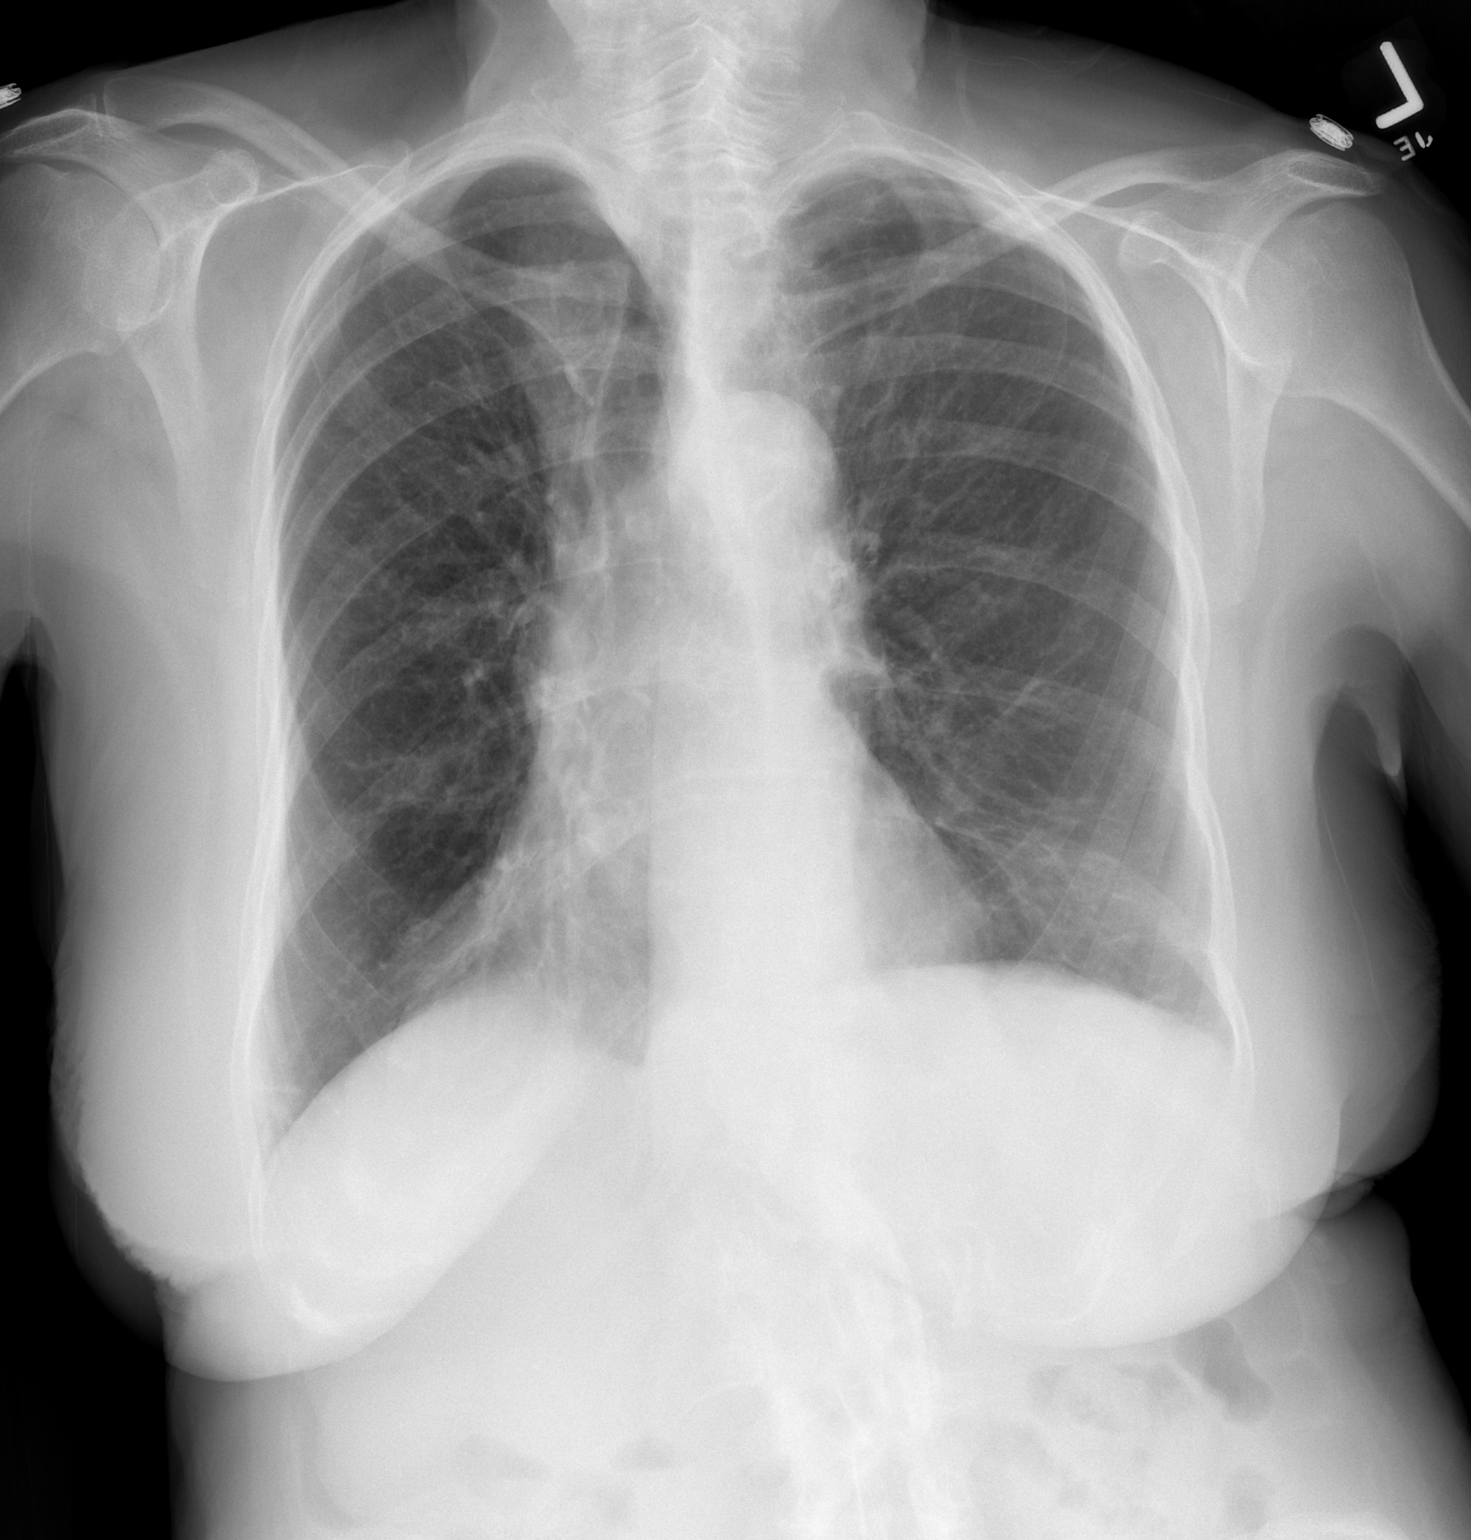

[w chest lat]
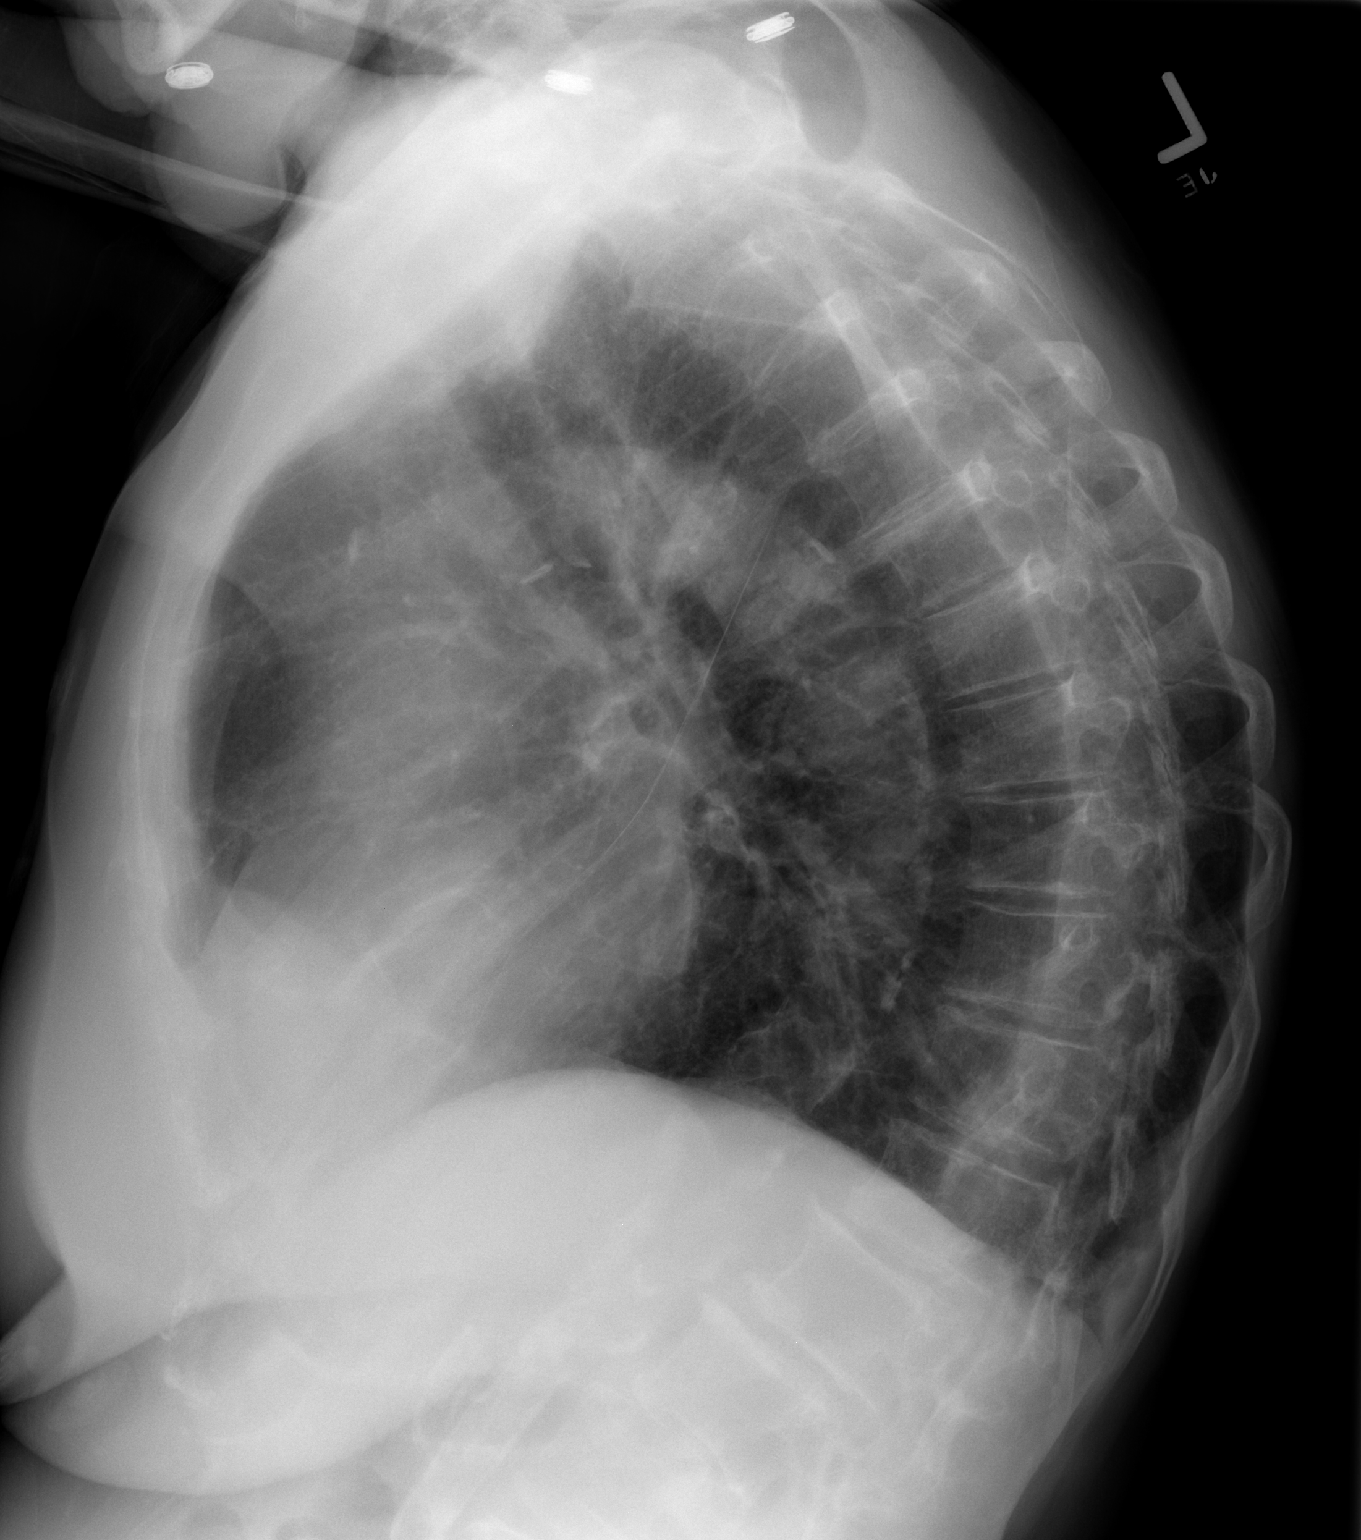

[2 of 2 positions shown; findings below may reference images not displayed]

FINDINGS: Accentuated thoracic kyphosis.  Increased A - P diameter
of the chest may relate to hyperinflation/COPD.  The lateral view
is mildly oblique.

There is concurrent convex right thoracic spine curvature.  Patient
rotated to the right. Normal heart size.  No pleural effusion or
pneumothorax.  There is volume loss at the medial right lung base.
This is similar to the 12/01/2003 exam.  A portion of the right
cardiophrenic angle blunting may also be secondary to distortion
from spinal curvature and accentuated kyphosis.  There is mild left
apical pleural parenchymal scar.
IMPRESSION: 1. No acute cardiopulmonary disease.
2.  Volume loss in the medial right lung base which is similar to
12/01/2003.
3.  Question hyperinflation/COPD.

## 2011-11-23 ENCOUNTER — Other Ambulatory Visit: Payer: Self-pay | Admitting: *Deleted

## 2011-11-23 MED ORDER — WARFARIN SODIUM 2.5 MG PO TABS: ORAL_TABLET | ORAL | Status: DC

## 2011-11-28 LAB — POCT INR: INR: 2.4

## 2011-11-29 ENCOUNTER — Ambulatory Visit: Payer: Self-pay | Admitting: Pharmacist

## 2011-11-29 DIAGNOSIS — I82409 Acute embolism and thrombosis of unspecified deep veins of unspecified lower extremity: Secondary | ICD-10-CM

## 2011-11-29 NOTE — Patient Instructions (Addendum)
Spoke with patient regarding plan with coumadin after rec'g notice from Dr. Reece Agar.  He saw her last week and her INR was 4.3.  He had her hold a few doses and resume a daily dose of 2.5 mg.  I rec'd our fax from [facility] with an INR of 2.4 on 11/28/11.  When I called Ms. Gunnerson she also informed me that she is getting a "shot in her back" next Wed and Dr. Ethelene Hal wants her to stop coumadin on Fri and not resume until Wed after the procedure.  This was discussed with Dr. Reece Agar and he asked to inform nurse at [facility] he would like to have a discussion with Dr. Ethelene Hal regarding her anticoagulation before and after the "shot."  This was not discussed at her last appmt with him.  All this info was faxed to [facility] and I discussed this with Mrs. Fricker.  She and the and the nurse were instructed to make 2.5 mg with half a 2mg  tablet and half a 3mg  tablet.  We will call in a 2.5 mg strength if this is the dose she will resume with her next INR check.  The nurse will be down to fill her pill box today for the next two weeks.

## 2011-11-29 NOTE — Progress Notes (Signed)
Spoke with patient regarding plan with coumadin after rec'g notice from Dr. G.  He saw her last week and her INR was 4.3.  He had her hold a few doses and resume a daily dose of 2.5 mg.  I rec'd our fax from [facility] with an INR of 2.4 on 11/28/11.  When I called Ms. Zucco she also informed me that she is getting a "shot in her back" next Wed and Dr. Ramos wants her to stop coumadin on Fri and not resume until Wed after the procedure.  This was discussed with Dr. G and he asked to inform nurse at [facility] he would like to have a discussion with Dr. Ramos regarding her anticoagulation before and after the "shot."  This was not discussed at her last appmt with him.  All this info was faxed to [facility] and I discussed this with Mrs. Muhl.  She and the and the nurse were instructed to make 2.5 mg with half a 2mg tablet and half a 3mg tablet.  We will call in a 2.5 mg strength if this is the dose she will resume with her next INR check.  The nurse will be down to fill her pill box today for the next two weeks.   

## 2011-12-05 ENCOUNTER — Other Ambulatory Visit: Payer: Self-pay | Admitting: Pulmonary Disease

## 2011-12-13 ENCOUNTER — Encounter: Payer: Self-pay | Admitting: Oncology

## 2011-12-18 ENCOUNTER — Ambulatory Visit (HOSPITAL_BASED_OUTPATIENT_CLINIC_OR_DEPARTMENT_OTHER): Payer: Medicare Other | Admitting: Pharmacist

## 2011-12-18 ENCOUNTER — Telehealth: Payer: Self-pay | Admitting: Pharmacist

## 2011-12-18 DIAGNOSIS — I82409 Acute embolism and thrombosis of unspecified deep veins of unspecified lower extremity: Secondary | ICD-10-CM

## 2011-12-18 LAB — POCT INR: INR: 2.5

## 2011-12-18 NOTE — Progress Notes (Signed)
Continue 3mg  daily.  Recheck INR in 2 weeks at [facility]. Orders faxed to [facility]. I also left a message for Corey Harold, [place] with above information. Pt is aware to continue same dose and recheck INR in 2 weeks at [facility].

## 2011-12-18 NOTE — Telephone Encounter (Signed)
Spoke with patient regarding her INR today. INR = 2.5 Continue Coumadin 3mg  daily (One tan colored tablet daily). Recheck INR in 2 weeks at [facility].

## 2011-12-18 NOTE — Patient Instructions (Addendum)
Continue 3mg  daily.  Recheck INR in 2 weeks at [facility].

## 2012-01-01 ENCOUNTER — Ambulatory Visit: Payer: Self-pay | Admitting: Pharmacist

## 2012-01-01 DIAGNOSIS — I82409 Acute embolism and thrombosis of unspecified deep veins of unspecified lower extremity: Secondary | ICD-10-CM

## 2012-01-01 NOTE — Progress Notes (Signed)
No changes with pt.  Will check PT/INR in 2 weeks at [facility].  Orders faxed to [facility] to continue 3mg  daily and check PT/INR in 2 weeks.

## 2012-01-03 ENCOUNTER — Telehealth: Payer: Self-pay | Admitting: *Deleted

## 2012-01-03 NOTE — Telephone Encounter (Signed)
Order faxed to C.Gwendolyn Grant RN/[facility] at [facility] for pt to remain on same dose of hydrea = 500mg  on Mondays & 1000mg  qod & check cbc/diff monthly per Dr. Cyndie Chime.

## 2012-01-04 ENCOUNTER — Telehealth: Payer: Self-pay | Admitting: Pulmonary Disease

## 2012-01-04 NOTE — Telephone Encounter (Signed)
Called and spoke with santana and she stated that the pts son gene requested that his mother have an appt with SN to eval her weight loss.  He stated that has been doing either boost or ensure but has still lost some weight and would like SN to see her and eval and give any recs for his mother.  appt has been scheduled with SN on 11/19 at 2:30.  santana is aware and will pass this info on to the pts son.

## 2012-01-15 ENCOUNTER — Ambulatory Visit (HOSPITAL_BASED_OUTPATIENT_CLINIC_OR_DEPARTMENT_OTHER): Payer: Self-pay | Admitting: Pharmacist

## 2012-01-15 DIAGNOSIS — I82409 Acute embolism and thrombosis of unspecified deep veins of unspecified lower extremity: Secondary | ICD-10-CM

## 2012-01-15 NOTE — Progress Notes (Signed)
INR = 2.7 on 3 mg/day Sherri Crawford is doing well.  No complaints. No missed doses & no new meds. INR at goal so cont 3 mg/day Recheck INR in 1 month at [facility].  I have faxed orders back to [facility] & left voicemail for resident coordinator. Marily Lente, Pharm.D.

## 2012-01-16 ENCOUNTER — Ambulatory Visit (INDEPENDENT_AMBULATORY_CARE_PROVIDER_SITE_OTHER): Payer: Medicare Other | Admitting: Pulmonary Disease

## 2012-01-16 ENCOUNTER — Encounter: Payer: Self-pay | Admitting: Pulmonary Disease

## 2012-01-16 ENCOUNTER — Encounter: Payer: Self-pay | Admitting: *Deleted

## 2012-01-16 VITALS — BP 112/60 | HR 80 | Temp 97.1°F | Ht 60.0 in | Wt 99.0 lb

## 2012-01-16 DIAGNOSIS — M810 Age-related osteoporosis without current pathological fracture: Secondary | ICD-10-CM

## 2012-01-16 DIAGNOSIS — D126 Benign neoplasm of colon, unspecified: Secondary | ICD-10-CM

## 2012-01-16 DIAGNOSIS — M1990 Unspecified osteoarthritis, unspecified site: Secondary | ICD-10-CM

## 2012-01-16 DIAGNOSIS — Z8679 Personal history of other diseases of the circulatory system: Secondary | ICD-10-CM

## 2012-01-16 DIAGNOSIS — I82409 Acute embolism and thrombosis of unspecified deep veins of unspecified lower extremity: Secondary | ICD-10-CM

## 2012-01-16 DIAGNOSIS — I10 Essential (primary) hypertension: Secondary | ICD-10-CM

## 2012-01-16 DIAGNOSIS — E7800 Pure hypercholesterolemia, unspecified: Secondary | ICD-10-CM

## 2012-01-16 DIAGNOSIS — I2510 Atherosclerotic heart disease of native coronary artery without angina pectoris: Secondary | ICD-10-CM

## 2012-01-16 DIAGNOSIS — I82402 Acute embolism and thrombosis of unspecified deep veins of left lower extremity: Secondary | ICD-10-CM

## 2012-01-16 DIAGNOSIS — K219 Gastro-esophageal reflux disease without esophagitis: Secondary | ICD-10-CM

## 2012-01-16 DIAGNOSIS — M545 Low back pain, unspecified: Secondary | ICD-10-CM

## 2012-01-16 DIAGNOSIS — J449 Chronic obstructive pulmonary disease, unspecified: Secondary | ICD-10-CM

## 2012-01-16 DIAGNOSIS — D473 Essential (hemorrhagic) thrombocythemia: Secondary | ICD-10-CM

## 2012-01-16 DIAGNOSIS — F411 Generalized anxiety disorder: Secondary | ICD-10-CM

## 2012-01-16 MED ORDER — MEGESTROL ACETATE 40 MG/ML PO SUSP: ORAL | Status: DC

## 2012-01-16 NOTE — Progress Notes (Signed)
Subjective:    Patient ID: Sherri Crawford, female    DOB: [birth year], 78 y.o.   MRN: [id number]  HPI 78 y/o WF here for a follow up visit... she has mult med problems including:  Asthmatic Bronchitis;  HBP;  CAD;  Hyperchol;  GERD/ Colon polyps;  DJD/ LBP/ Osteoporosis;  ?hx TIA;  Essential Thrombocytosis on Hydroxyurea per [name]...  ~  July 30, 2009:  Hosp x2d by [facility] for what sounds like another NTG misadventure> transient syncopal spell after 1NTG for ?lower CP/ epig discomfort, then weak & had to crawl to bedroom...  Exam was unrevealing, EKG's w/o acute changes, Enz & labs= neg, 2DEcho w/ mild LVH, EF= 60-65%, no wall motion abn, gr1 DD, ?mass in RA?;  subseq TEE showed norm LV w/ EF= 60-65%, incr thickness of atrial septum c/w severe lipomatous hypertrophy (no mass), mod plaque in Ao... we discussed throwing out her NTG & using Pepcid AC, Mylanta liq, Tramadol for future episodes of discomfort... she has been following up w/ [name] on Hydroxyurea> plat count down to 600K.  ~  May 23, 2010:  9 month ROV & review> she is c/o decr appetite and weight loss assoc w/ ?3wk hx swallowing difficulty that seems intermittent & occurs most often after she has eaten 1/2 of her lunch meal "it doesn't want to go down" & has on occas vomited it back up; she takes Nexium 40mg /d; Weights on office documented 103-105 over the last 6months in 2011, & dropped from 103 to 97# from 1/12 to today (6# wt loss in 36mo);  She was instructed to incr Ensure consumption & we discussed Megace trial of appetite & refer to GI for swallowing difficulty (NOTE: prev EGD 2008 showed GERD & esophagitis, &Colon showed tub adenoma)...    <AB>  Stable on Advair, Singulair, Albuterol prn;  No recent exac- doing satis from the pulm standpoint...    <HBP/ CAD>  On ASA & only takes 25mg  Metoprolol if BP>150 (this is how she likes to do it);  She had 2 NTG reactions & this was discontinued 5/11;  BP today = 100/50 w/ her wt  loss, no angina etc;  Last saw [name] 1/12> stable, no changes, f/u 38yr...    <Chol>  On Lip20 now & last FLP 4/11 on Lip10 reviewed;  She is not fasting today so we can't check FLP, wonder if we even need it now w/ her wt loss, but in view of her signif coronary & Ao calcif & ?prev TIA> rec continue Lip20 for now & f/u FLP when able...    <GI>  See above...    <DJD/ LBP/ Osteop> followed by [name] on Lyrica, Hydrocodone, Celebrex prn;  Given last epid steroid shot 1/12, mild benefit;  NOTE: today's CXR shows new ant wedging T8 (~40%), last BMD 2008 showed TScore +0.4 in spine (it is not in [employer] EMR)...    <Heme>  Followed by [name] w/ Essential Thrombocytosis on Hydroxyurea (last note 1/12 reviewed- well controlled on 500mg /d x 1000mg  on Mondays w/ plats betw 218-365K)...  ~  Jul 13, 2010:  36mo ROV & post hosp check> GI eval 4/12 DrGessner showed abn Ba Esophagram w/ diffuse motility disorder & narrowing of distal esoph, plus laryngeal penetration; subseq EGD by [name] showed a presbyesoph & spasm in the distal esoph w/o stricture, Savory dilatation done, mult sm gastric polyps noted...  She was Adm 5/4 - 07/03/10 w/ left leg DVT, unprovoked (hypercoag panel- pending), w/ hx underlying  essential thrombocytosis on Hydroxyurea from [name];  Started on Coumadin w/ Lovenox bridge & early disch w/ protimes via visiting nurse & called to [name] who already checks CBC monthly...  Feeling better since the hosp, denies CP/ palpit/ SOB, & swelling in left leg is diminished- just mild now & she knows to elim salt, elevate leg, wear support hose...  ~  November 16, 2010:  18mo ROV & she continues stable w/ severe underlying multisys pathology as noted>>    <COPD/ AB> She was seen by DrESL 8/12 & he stopped her Singulair & decreased her Advair to 100/50 Bid, but since then she has had to increase her Proair use to 3-4 times daily;     <Cards> HBP, CAD, Hx DVT> on Metoprolol 25mg Bid & BP=  126/82; she reports feeling ok & denies CP, palpit, ch in SOB, edema, etc; she also remains on Coumadin with Protimes monitored in DrGranfortunas office...    <CHOL> on Lip20 but he last FLP was over 62yr ago & she forgets to come fasting for f/u lab here, we discussed this again, see below...    <GI> GERD, Polyps> on Nexium 40mg /d + Ensure & Megace for appetite & wt gain- now at 105#; followed by DrGessner for GI> his notes are reviewed...    <DJD, Osteoporosis> on calcium, MVI, Vit D 1000 u daily; she has mod diffuse DJD & uses Tylenol as needed...    <Thrombocythemia> she is followed by [name] on Hydrea & his note of 7/12 is reviewed w/ platelets counts betw 275-359...  ~  March 21, 2011:  18mo ROV & she is here for routine follow up> reports doing well w/o new complaints or concerns today... She lives at [facility] & has been walking daily to the AL unit to see husb who fell w/ hip fx ~81mo ago; we discussed exercise/ rehab/ etc.    She saw [name] for Cards f/u 1/13> Hx CAD- no CP, palpit, dizziness, etc; also on Coumadin for hx DVT & thrombocytosis- followed by [name]; stable & no changes made...    Labs & Meds reviewed w/ pt> Protimes ok per [name];  CBC 11/12- Hg=12.1 & Plat=433 on her Hydrea Rx;  Chems- all ok...  ~  September 18, 2011:  74mo ROV & selena reports doing satis w/ her CC= LBP & she has f/u appt w/ [name] soon on Norco10 as needed...    <COPD/ AB> as noted DrESL prev decreased her Advair to 100/50 qd & stopped her Singulair, but she had to incr the Proair rescue to 3-4x daily; now back on Advair100Bid & the Singulair10mg /d; her rescue inhaler use is down to 1-2x daily & she is comfortable w/ this...    <Cards> HBP, CAD, Hx DVT> on Metoprolol 25mg Bid & BP= 138/76; she reports feeling ok & denies CP, palpit, ch in SOB, edema, etc; she also remains on Coumadin with Protimes drawn [facility] & monitored in DrGranfortunas office...    <CHOL> on Lip20 & her FLP looks  good w/ TChol 169, TG 137, HDL 63, LDL 79...    <GI> GERD, Polyps> on Nexium 40mg /d + Ensure & Megace for appetite & wt gain- now at 99# but she admits to not taking the supplements regularly; followed by DrGessner for GI...    <DJD, Osteoporosis> on calcium, MVI, Vit D 1000 u daily; she has mod diffuse DJD & uses NORCO10 & Tylenol as needed...    <Thrombocythemia> she is followed by [name] on Hydrea & his notes are reviewed  w/ platelets counts holding at 400K... We reviewed prob list, meds, xrays and labs> see below for updates >> LABS 5/13 by DrG:  Chems- wnl;  CBC- wnl w/ Plat=405K LABS 7/13:  FLP- at goals on Lip20;  TSH=2.10;  VitD=51     ~  January 16, 2012:  40mo ROV & Andrica is here w/ her son today> CC is wt loss & appetite is just fair> on reviewing chart she has been on Megace & Ensure for >69yr but has decreased the dose to 1tsp/d & 1can/d (the result of which is appetite just fair & wt down to 94#=> now 96#);  We discuseed increasing the Megace to 1tsp Tid at least & 2-3 cans of ensure daily, they will continue to monitor wts...     She had Allergy/ Asthma f/u DrVanWinkle 8/13> still on weekly allergy vaccine; they increased Advair to 250 & continued her other meds the same...    She saw [name] 10/13> recurrent LBP, given ESI L5-S1 left, improved...    She saw [name] 9/13> f/u essential thrombocytosis on Hydrea (500mg /d & 1000mg  on Mon), DVT (left leg, 5/12) on Coumadin; Plat count was 375K and [facility] & they monitor her monthly... We reviewed prob list, meds, xrays and labs> see below for updates >> [facility]...          Problem List:  MACULAR DEGENERATION (ICD-362.50) - sees [name] for follow up- on Vitamin therapy...  ASTHMATIC BRONCHITIS & COPD - she was stable on Advair250 + Singulair10 w/ Prn Albuterol, but 8/12 DrESL stopped her Singulair & cut the ADVAIR to 100/50Bid; since then she reports that  she's had to increase the Proair use to 3-4 times daily but she does not want to go back up on the Advair dose;  she still takes allergy shots every 2weeks at [facility]. ~  CXR 5/12 showed normal heart size, clear lungs w/ some basilar atx, T8 compression... ~  7/13:  Stable on Advair100Bid & back on Singulair10mg /d; using Proair rescue 1-2 times daily & she wants to continue as is... ~  11/13:  She is back on Advair250 per DrVanwinkle + Singulair10, Proair prn & reports that her breathing is OK  HYPERTENSION,  CORONARY ARTERY DISEASE,  RBBB - on METOPROLOL 25mg  Bid & off ASA now that she's on Coumadin... she has known non-obstructive CAD and followed by [name]... she was prev on Dyazide but stopped this med... ~  Nuclear Stress Test 9/09 was normal- no infarct, no ischemia, EF= 74%... ~  3/11 & 5/11 >> 2 episodes of NTG reactions w/ drop in BP (see above) therefore NTG discontinued... ~  3/12:  BP= 110/70 & she takes METOPROLOL 25mg  if her BP is > 150 (this is how she likes to do it); denies HA, fatigue, visual changes, recurrent CP, palipit, dizziness, syncope, dyspnea, edema, etc... ~  5/12 Hosp w/ left leg DVT> CXR showed norm heart size, clear lungs x atx at bases, old T8 compression;  EKG showed SBrady, RBBB; Coumadin started & ASA discontinued. ~  5/12:  BP= 118/60 & she is essent asymptomatic now... ~  9/12:  BP= 126/82 & stable on Metop25Bid + diet etc... ~  1/13:  BP= 108/64 & she remains essent asymptomatic... ~  7/13:  BP= 138/76 & she denies CP, palpit, dizzy, SOB, edema, etc... ~  11/13:  BP= 112/60 & she remains largely asymptomatic...  DVT>  On Coumadin started 5/12 [facility] &  protimes monitored by [name] due to this & essential thrombocytosis...  HYPERCHOLESTEROLEMIA - on diet + LIPITOR 20mg /d now... tolerated well...  ~  FLP 1/08 showed TChol 198, TG 129, HDL 59, LDL 113... ~  FLP 5/09 showed TChol 168, TG 100, HDL 49, LDL 99... ~  FLP 4/10 on Lip10 showed TChol 183, TG  117, HDL 50, LDL 109... rec> same med, better diet. ~  FLP 4/11 on Lip10 showed TChol 223, Tg 87, HDL 62, LDL 152... rec incr Lip20. ~  FLP 7/13 on Lip20 showed TChol 169, TG 137, HDL 63, LDL 79...   GERD - on NEXIUM 40mg /d... ~  [name] did an EGD 4/08 showing GERD, esophagitis and Rx w/ NEXIUM 40/d (it helps)... ~  EGD 4/12 by [name] showed a presbyesoph & spasm in the distal esoph w/o stricture, Savory dilatation done, mult sm gastric polyps noted...    COLONIC POLYPS - last colonoscopy was 4/08 by [name] showing 3mm polyp= tubular adenoma...   DEGENERATIVE JOINT DISEASE (ICD-715.90) BACK PAIN, LUMBAR - she has signif Back Pain and is followed in the pain clinic ([name] now & he has her on LORCET-HD 10/500 taking 1Qid); off prev Lyrica, Celebrex, Tramadol... ~  10/13:  S/p ESI L5-S1 for spinal stenosis by [name]...  OSTEOPOROSIS (ICD-733.00) - prev on Boniva 150mg /mo + Calcium & Vitamins... ~  BMD here 5/01 showed TScores -0.1 in Spine, & -1.4 in left FemNeck ~  BMD here 7/08 showed TScores +0.4 in Spine, & -1.6 in left FemNeck...  ~  labs 4/10 showed Vit D level = 34... rec> take Vit D 1000 u OTC daily. ~  9/12:  Her Boniva was stopped in the interim & she is not sure when (likely during her hosp)> rec to continue calcium, MVI, Vit D & needs f/u BMD.  TRANSIENT ISCHEMIC ATTACKS, HX OF - she has sm vessel disease on prev scans... Prev on 81mg  ECASA daily but this was stopped when she started on the Coumadin for DVT. ~  3/11: went to ER w/ episode of CP, s/p 3 NTG, developed left arm weakness, no speech or leg symptoms... she was off ASA for several days in anticipation of shot from [name]... ? NTG related, ?off ASA related, ?need for Plavix... decision made to stay on ASA, limit NTG. ~  5/12:  Adm w/ DVT & placed on Hep/ Coumadin therefore ASA was discontinued; Protimes done at [facility] & coordinated thru [name]'s office...  ANXIETY (ICD-300.00) - not currently on  medication...  ESSENTIAL THROMBOCYTHEMIA (ICD-238.71) - noted to have rising platelet count & referred to [facility] 4/11 w/ Dx of Essential Thrombocytosis/ myeloproliferative disorder & started on HYDROXYUREA 500mg /d w/ titration from DrG... ~  labs 4/11 showed platlet count = 994K... Hydroxyurea started by Heme. ~  labs 5/11 showed platelet count = 602K ~  Labs 3/12 showed Hg= 12.2 (MCV=118), WBC= 5.7, Plat=294K ~  Labs in hosp 5/12 showed Hg down to 10.7, MCV= 116... ~  [name] continues to monitor labs & protimes monthly thru his clinic... ~  EPIC labs reviewed & 11/12 Hg=12.1 and Plat= 433K... ~  7/13:  EPIC reviewed & pt on stable dose of HYDREA w/ Platlets ~400K... ~  9/13:  F/u [name] on Hydrea for thrombocytosis & Coumadin for DVT...   Past Surgical History  Procedure Date  . Appendectomy   . Cholecystectomy   . Tah and bso 08/21/1995    Outpatient Encounter Prescriptions as of 01/16/2012  Medication Sig Dispense Refill  .  albuterol (PROAIR HFA) 108 (90 BASE) MCG/ACT inhaler Inhale 2 puffs into the lungs every 6 (six) hours as needed for wheezing. As needed for wheezing  1 Inhaler  11  . atorvastatin (LIPITOR) 20 MG tablet Take 20 mg by mouth daily.       . Cholecalciferol (VITAMIN D3) 1000 UNITS CAPS Take 1 capsule by mouth daily.        Marland Kitchen EPIPEN 2-PAK 0.3 MG/0.3ML DEVI as needed.      . Fluticasone-Salmeterol (ADVAIR) 250-50 MCG/DOSE AEPB Inhale 1 puff into the lungs every 12 (twelve) hours.      Marland Kitchen HYDROcodone-acetaminophen (NORCO) 10-325 MG per tablet Take 1 tablet by mouth every 6 (six) hours as needed.      . hydroxyurea (HYDREA) 500 MG capsule TAKE 2 CAPSULES BY MOUTH ON MONDAY (1000MG ) AND THEN TAKE 1 CAPSULE BY MOUTH EVERY OTHER DAY (500MG )  34 capsule  10  . megestrol (MEGACE) 40 MG/ML suspension Take 5 mLs (200 mg total) by mouth daily.  480 mL  5  . metoprolol tartrate (LOPRESSOR) 25 MG tablet TAKE 1 TABLET (25 MG TOTAL) BY MOUTH 2 (TWO) TIMES DAILY.  AS DIRECTED FOR HEART AND BLOOD PRESSURE  60 tablet  3  . montelukast (SINGULAIR) 10 MG tablet TAKE 1 TABLET BY MOUTH ONCE A DAY  30 tablet  3  . Multiple Vitamin (MULTIVITAMIN PO) Take 1 tablet by mouth daily.        . Multiple Vitamins-Minerals (MACULAR VITAMIN BENEFIT PO) Take 1 tablet by mouth daily.      Marland Kitchen NEXIUM 40 MG capsule TAKE 1 CAPSULE (40 MG TOTAL) BY MOUTH DAILY BEFORE BREAKFAST.  30 capsule  10  . nitroGLYCERIN (NITROSTAT) 0.4 MG SL tablet Place 0.4 mg under the tongue every 5 (five) minutes as needed.        . Nutritional Supplements (ENSURE ENLIVE) LIQD Take 1 Can by mouth daily.       Marland Kitchen warfarin (COUMADIN) 2.5 MG tablet Take 2.5 mg by mouth once a day  30 tablet  5  . [DISCONTINUED] Fluticasone-Salmeterol (ADVAIR) 100-50 MCG/DOSE AEPB Inhale 1 puff into the lungs every 12 (twelve) hours.          Allergies  Allergen Reactions  . Sulfonamide Derivatives     Pt is unsure of reaction    Current Medications, Allergies, Past Medical History, Past Surgical History, Family History, and Social History were reviewed in [employer] record.    Review of Systems        See HPI - all other systems neg except as noted... The patient complains of decreased hearing and dyspnea on exertion.  The patient denies anorexia, fever, weight loss, weight gain, vision loss, hoarseness, chest pain, syncope, peripheral edema, prolonged cough, headaches, hemoptysis, abdominal pain, melena, hematochezia, severe indigestion/heartburn, hematuria, incontinence, muscle weakness, suspicious skin lesions, transient blindness, difficulty walking, depression, unusual weight change, abnormal bleeding, enlarged lymph nodes, and angioedema.     Objective:   Physical Exam     WD, Thin, 78 y/o WF in NAD... GENERAL:  Alert & oriented; pleasant & cooperative... HEENT:  [place]/AT, EOM-wnl, PERRLA, EACs-clear, TMs-wnl, NOSE-clear, THROAT-clear & wnl. NECK:  Supple w/ fairROM; no JVD; normal  carotid impulses w/o bruits; no thyromegaly or nodules palpated; no lymphadenopathy. CHEST:  Decr BS bilat & clear to P & A; without wheezes/ rales/ or rhonchi heard... HEART:  Regular Rhythm; without murmurs/ rubs/ or gallops detected...  ABDOMEN:  Thin, soft & nontender; normal bowel  sounds; no organomegaly or masses palpated... EXT: without deformities, mild arthritic changes; no varicose veins/ +venous insuffic/ no edema. BACK:  kyphosis without focal tenderness... NEURO:  CN's intact; no focal neuro deficits; gait abn due to LBP... DERM:  No lesions noted; no rash etc...  RADIOLOGY DATA:  Reviewed in the EPIC EMR & discussed w/ the patient...  LABORATORY DATA:  Reviewed in the EPIC EMR & discussed w/ the patient...   Assessment & Plan:    DVT>  She is on Coumadin w/ protimes by visiting nurses & called to [name] who will be monitoring this going forward since he already monitors her Hydroxyurea for the thrombocytosis...  Essential Thrombocytosis>  As above, stable on Hydroxyurea, monitored by DrG...  COPD>  She feels she is stable on the Advair250 & Singulair;  Using Proair rescue 1-2 times daily & she is comfortable w/ this...  HBP/ CAD>  Stable on Metoprolol, no angina etc...  CHOL>  FLP on the Lip20 looks good- continue same...  DJD/ Osteoporosis>  She is followed by [name]; also needs f/u BMD when able...  Other medical problems as noted...   Patient's Medications  New Prescriptions   No medications on file  Previous Medications   ALBUTEROL (PROAIR HFA) 108 (90 BASE) MCG/ACT INHALER    Inhale 2 puffs into the lungs every 6 (six) hours as needed for wheezing. As needed for wheezing   ATORVASTATIN (LIPITOR) 20 MG TABLET    Take 20 mg by mouth daily.    CHOLECALCIFEROL (VITAMIN D3) 1000 UNITS CAPS    Take 1 capsule by mouth daily.     EPIPEN 2-PAK 0.3 MG/0.3ML DEVI    as needed.   FLUTICASONE-SALMETEROL (ADVAIR) 250-50 MCG/DOSE AEPB    Inhale 1 puff into the  lungs every 12 (twelve) hours.   HYDROCODONE-ACETAMINOPHEN (NORCO) 10-325 MG PER TABLET    Take 1 tablet by mouth every 6 (six) hours as needed.   HYDROXYUREA (HYDREA) 500 MG CAPSULE    TAKE 2 CAPSULES BY MOUTH ON MONDAY (1000MG ) AND THEN TAKE 1 CAPSULE BY MOUTH EVERY OTHER DAY (500MG )   METOPROLOL TARTRATE (LOPRESSOR) 25 MG TABLET    TAKE 1 TABLET (25 MG TOTAL) BY MOUTH 2 (TWO) TIMES DAILY. AS DIRECTED FOR HEART AND BLOOD PRESSURE   MONTELUKAST (SINGULAIR) 10 MG TABLET    TAKE 1 TABLET BY MOUTH ONCE A DAY   MULTIPLE VITAMIN (MULTIVITAMIN PO)    Take 1 tablet by mouth daily.     MULTIPLE VITAMINS-MINERALS (MACULAR VITAMIN BENEFIT PO)    Take 1 tablet by mouth daily.   NEXIUM 40 MG CAPSULE    TAKE 1 CAPSULE (40 MG TOTAL) BY MOUTH DAILY BEFORE BREAKFAST.   NITROGLYCERIN (NITROSTAT) 0.4 MG SL TABLET    Place 0.4 mg under the tongue every 5 (five) minutes as needed.     NUTRITIONAL SUPPLEMENTS (ENSURE ENLIVE) LIQD    Take 1 Can by mouth daily.    WARFARIN (COUMADIN) 2.5 MG TABLET    Take 2.5 mg by mouth once a day  Modified Medications   Modified Medication Previous Medication   MEGESTROL (MEGACE) 40 MG/ML SUSPENSION megestrol (MEGACE) 40 MG/ML suspension      Take 5 cc by mouth three times daily Take 5 cc by mouth three times daily    Take 5 mLs (200 mg total) by mouth daily.  Discontinued Medications   FLUTICASONE-SALMETEROL (ADVAIR) 100-50 MCG/DOSE AEPB    Inhale 1 puff into the lungs every 12 (twelve) hours.  MEGESTROL (MEGACE) 40 MG/ML SUSPENSION    Take 5 cc by mouth three times daily

## 2012-01-16 NOTE — Patient Instructions (Addendum)
Today we updated your med list in our EPIC system...    Continue your current medications the same...  We reviewed the MEGACE for appetite> one tsp 3 times daily...    And the Ensure to help you gain weight> 1/2 to 1 can betw meals and at bedtime...  Call for any questions...  Let's plan a recheck in 4 months w/ FASTING blood work at that time.Marland KitchenMarland Kitchen

## 2012-01-24 ENCOUNTER — Other Ambulatory Visit: Payer: Self-pay | Admitting: Oncology

## 2012-02-02 ENCOUNTER — Telehealth: Payer: Self-pay | Admitting: *Deleted

## 2012-02-02 NOTE — Telephone Encounter (Signed)
Called and spoke with [facility] @[facility].   Pt. Is taking hydrea as follows:  500mg  tablets.   Takes two on Mondays and then one every Other day - Wed.- Frid- Sun.

## 2012-02-07 ENCOUNTER — Telehealth: Payer: Self-pay | Admitting: *Deleted

## 2012-02-07 NOTE — Telephone Encounter (Signed)
Called and spoke with [place], RN at [facility] [phone]) per Dr. Cyndie Chime pt should stay on the same does of Hydrea and have CBC Q month.  [name] verbalized understanding and states they do get her CBC every Tues or Thurs each month and send copy's to Dr. Cyndie Chime.

## 2012-02-13 ENCOUNTER — Ambulatory Visit (HOSPITAL_BASED_OUTPATIENT_CLINIC_OR_DEPARTMENT_OTHER): Payer: Medicare Other | Admitting: Pharmacist

## 2012-02-13 DIAGNOSIS — I82409 Acute embolism and thrombosis of unspecified deep veins of unspecified lower extremity: Secondary | ICD-10-CM

## 2012-02-13 LAB — POCT INR: INR: 4.2

## 2012-02-13 NOTE — Progress Notes (Signed)
INR supratherapeutic today (4.2) on 3mg  daily. Pt denies any changes in meds, diet, or missed doses.  No bleeding or bruising. Will hold Coumadin x 2 days, then resume 3mg  daily since pt has been previously stable on this dose.  Recheck INR in 6 days.

## 2012-02-19 ENCOUNTER — Ambulatory Visit (HOSPITAL_BASED_OUTPATIENT_CLINIC_OR_DEPARTMENT_OTHER): Payer: Medicare Other | Admitting: Pharmacist

## 2012-02-19 DIAGNOSIS — I82409 Acute embolism and thrombosis of unspecified deep veins of unspecified lower extremity: Secondary | ICD-10-CM

## 2012-02-20 NOTE — Progress Notes (Signed)
INR supratherapeutic (3.4) after holding 2 doses, then resuming 3mg  daily. No changes in meds or diet.  No complaints.  Will have pt hold 1 dose, then resume 3mg  daily.  Recheck INR in 1 week at [facility].

## 2012-02-26 ENCOUNTER — Ambulatory Visit (HOSPITAL_BASED_OUTPATIENT_CLINIC_OR_DEPARTMENT_OTHER): Payer: Medicare Other | Admitting: Pharmacist

## 2012-02-26 DIAGNOSIS — I82409 Acute embolism and thrombosis of unspecified deep veins of unspecified lower extremity: Secondary | ICD-10-CM

## 2012-02-26 LAB — POCT INR: INR: 4.7

## 2012-02-26 NOTE — Progress Notes (Signed)
INR = 4.7 on 3 mg/day Pt confirmed tablet is a 3 mg tablet No recent med changes (additions or deletions).  No recent abx.  Confirmed this w/ Corey Harold, RN at [facility]. No bleeding. Hold Coumadin x 3 days then recheck INR, CBC & CMET on 02/29/12 Instructions reviewed w/ pt over phone. Marily Lente, Pharm.D.

## 2012-02-29 ENCOUNTER — Telehealth: Payer: Self-pay | Admitting: Pharmacist

## 2012-02-29 ENCOUNTER — Telehealth: Payer: Self-pay | Admitting: *Deleted

## 2012-02-29 DIAGNOSIS — I82409 Acute embolism and thrombosis of unspecified deep veins of unspecified lower extremity: Secondary | ICD-10-CM

## 2012-02-29 MED ORDER — WARFARIN SODIUM 2.5 MG PO TABS: ORAL_TABLET | ORAL | Status: DC

## 2012-02-29 NOTE — Telephone Encounter (Signed)
Notified Santana/Nurse/[facility] to have pt stay on same dose of hydrea=1000mg  on mondays & then one qod MW,Sun.

## 2012-02-29 NOTE — Telephone Encounter (Signed)
INR = 1.4 after holding Coumadin x 3 days.  Will resume Coumadin at lower dose of 2.5mg  daily and check PT/INR on 03/05/12.  I spoke to pt and she is aware of Coumadin dose change and to begin today.  I have left VM for C Gwendolyn Grant (resident coordinator) and faxed orders to [facility].  New prescription called into [facility] [phone]).  Will f/u with pt on 03/05/12.

## 2012-03-05 ENCOUNTER — Ambulatory Visit (HOSPITAL_BASED_OUTPATIENT_CLINIC_OR_DEPARTMENT_OTHER): Payer: Medicare Other | Admitting: Pharmacist

## 2012-03-05 ENCOUNTER — Telehealth: Payer: Self-pay | Admitting: Pulmonary Disease

## 2012-03-05 DIAGNOSIS — I82409 Acute embolism and thrombosis of unspecified deep veins of unspecified lower extremity: Secondary | ICD-10-CM

## 2012-03-05 NOTE — Telephone Encounter (Signed)
Called, spoke with Sherri Crawford.  States since Friday, she has noticed pt being more confused than usual.  States pt is needing more reminders for the day than usual.  Would like to check a urine to r/o UTI.  Per Corey Harold, pt denies any other UTI symptoms and is afebrile.  She is faxing over the request to triage.  Dr. Kriste Basque, pls advise if you are ok with this.  Thank you.

## 2012-03-05 NOTE — Telephone Encounter (Signed)
Form placed on SN cart to be signed and will call santana once this done.

## 2012-03-05 NOTE — Telephone Encounter (Signed)
Form has been completed and faxed back.  i called santana and she is aware that ok to do the UA.  Nothing futher is needed.

## 2012-03-05 NOTE — Progress Notes (Signed)
INR slightly supratherapeutic today (2.6) on 2.5mg  daily. No changes.  No complaints. Will have pt continue current dose and recheck INR in 6 days at [facility].  Spoke with Corey Harold, RN at [facility] and verbally gave these orders.  She will update the patient's pill box today.

## 2012-03-08 ENCOUNTER — Telehealth: Payer: Self-pay | Admitting: Pulmonary Disease

## 2012-03-08 NOTE — Telephone Encounter (Signed)
Called and spoke with pts daughter and she stated that the pt will be moving to assited living near the end of the month and she is already down in the dumps about this.  Daughter did not want to mention this to her mother at this time about maybe starting on an anti-depressant. Daughter will discuss at next ov.

## 2012-03-11 ENCOUNTER — Ambulatory Visit (HOSPITAL_BASED_OUTPATIENT_CLINIC_OR_DEPARTMENT_OTHER): Payer: Medicare Other | Admitting: Pharmacist

## 2012-03-11 DIAGNOSIS — I82409 Acute embolism and thrombosis of unspecified deep veins of unspecified lower extremity: Secondary | ICD-10-CM

## 2012-03-11 NOTE — Progress Notes (Signed)
Spoke with Virgel Bouquet at [facility] [phone]).  Neither are sure why patient is trending in supratherapeutic range these last few INR's, after being stable on 3mg  for quite some time.  Her INR today is 3.8. I verified with patient and Corey Harold color of tablets and correct dose.  She has been on 2.5 mg daily.  We will hold dose today and resume for 2 days until needing to stop on 03/14/12 per Dr. Ethelene Hal for epidural on 03/19/12.  She will have her INR checked when she is back in to see Dr. Cyndie Chime on 03/18/12.  No need to see pharmacist as long as INR subtherapeutic as expected for epidural on 03/19/12.  Dr. Cyndie Chime can address resuming anticoagulation instructions with patient and let coumadin clinic pharmacist know when we need to set up next [facility].

## 2012-03-11 NOTE — Patient Instructions (Signed)
Hold coumadin today then resume at 2.5 mg daily.  Pt is stopping coumadin on 03/14/12 per Dr. Ethelene Hal for epidural on 03/19/12.  She will have INR done at MD visit with Dr. Cyndie Chime on 03/18/12.

## 2012-03-12 ENCOUNTER — Telehealth: Payer: Self-pay | Admitting: Oncology

## 2012-03-12 ENCOUNTER — Encounter: Payer: Self-pay | Admitting: Pulmonary Disease

## 2012-03-12 ENCOUNTER — Ambulatory Visit (INDEPENDENT_AMBULATORY_CARE_PROVIDER_SITE_OTHER): Payer: Medicare Other | Admitting: Pulmonary Disease

## 2012-03-12 VITALS — BP 112/64 | HR 74 | Temp 98.4°F | Ht 60.0 in | Wt 96.8 lb

## 2012-03-12 DIAGNOSIS — Z8679 Personal history of other diseases of the circulatory system: Secondary | ICD-10-CM

## 2012-03-12 DIAGNOSIS — F419 Anxiety disorder, unspecified: Secondary | ICD-10-CM | POA: Insufficient documentation

## 2012-03-12 DIAGNOSIS — E7800 Pure hypercholesterolemia, unspecified: Secondary | ICD-10-CM

## 2012-03-12 DIAGNOSIS — F32A Depression, unspecified: Secondary | ICD-10-CM

## 2012-03-12 DIAGNOSIS — M545 Low back pain, unspecified: Secondary | ICD-10-CM

## 2012-03-12 DIAGNOSIS — K219 Gastro-esophageal reflux disease without esophagitis: Secondary | ICD-10-CM

## 2012-03-12 DIAGNOSIS — F341 Dysthymic disorder: Secondary | ICD-10-CM

## 2012-03-12 DIAGNOSIS — F329 Major depressive disorder, single episode, unspecified: Secondary | ICD-10-CM

## 2012-03-12 DIAGNOSIS — M810 Age-related osteoporosis without current pathological fracture: Secondary | ICD-10-CM

## 2012-03-12 DIAGNOSIS — I2510 Atherosclerotic heart disease of native coronary artery without angina pectoris: Secondary | ICD-10-CM

## 2012-03-12 DIAGNOSIS — M1990 Unspecified osteoarthritis, unspecified site: Secondary | ICD-10-CM

## 2012-03-12 DIAGNOSIS — I82409 Acute embolism and thrombosis of unspecified deep veins of unspecified lower extremity: Secondary | ICD-10-CM

## 2012-03-12 DIAGNOSIS — J449 Chronic obstructive pulmonary disease, unspecified: Secondary | ICD-10-CM

## 2012-03-12 DIAGNOSIS — J4489 Other specified chronic obstructive pulmonary disease: Secondary | ICD-10-CM

## 2012-03-12 DIAGNOSIS — D126 Benign neoplasm of colon, unspecified: Secondary | ICD-10-CM

## 2012-03-12 DIAGNOSIS — I10 Essential (primary) hypertension: Secondary | ICD-10-CM

## 2012-03-12 HISTORY — DX: Depression, unspecified: F32.A

## 2012-03-12 HISTORY — DX: Anxiety disorder, unspecified: F41.9

## 2012-03-12 MED ORDER — SERTRALINE HCL 25 MG PO TABS: 25.0000 mg | ORAL_TABLET | Freq: Every day | ORAL | Status: DC

## 2012-03-12 NOTE — Patient Instructions (Addendum)
Today we updated your med list in our EPIC system...    Continue your current medications the same...  We decided to try a low dose of SERTRALINE to see if it helps anxiety & depression...    Start w/ 25mg  once daily & let me know how it's working & if we need to increase the dose later...  Call for any questions or if we can be of service in any way...  Let's plan a follow up visit in about 4 months.Marland KitchenMarland Kitchen

## 2012-03-12 NOTE — Progress Notes (Signed)
Subjective:    Patient ID: Sherri Crawford, female    DOB: 02/27/1921, 79 y.o.   MRN: [id number]  HPI 79 y/o WF here for a follow up visit... she has mult med problems including:  Asthmatic Bronchitis;  HBP;  CAD;  Hyperchol;  GERD/ Colon polyps;  DJD/ LBP/ Osteoporosis;  ?hx TIA;  Essential Thrombocytosis on Hydroxyurea per [name]...  SEE PREV EPIC NOTES FOR EARLIER DATA >>  ~  January 16, 2012:  33mo ROV & Sherri Crawford is here w/ her son today> CC is wt loss & appetite is just fair> on reviewing chart she has been on Megace & Ensure for >38yr but has decreased the dose to 1tsp/d & 1can/d (the result of which is appetite just fair & wt down to 94#=> now 96#);  We discuseed increasing the Megace to 1tsp Tid at least & 2-3 cans of ensure daily, they will continue to monitor wts...     She had Allergy/ Asthma f/u DrVanWinkle 8/13> still on weekly allergy vaccine; they increased Advair to 250 & continued her other meds the same...    She saw [name] 10/13> recurrent LBP, given ESI L5-S1 left, improved...    She saw [name] 9/13> f/u essential thrombocytosis on Hydrea (500mg /d & 1000mg  on Mon), DVT (left leg, 5/12) on Coumadin; Plat count was 375K and [facility] & they monitor her monthly... We reviewed prob list, meds, xrays and labs> see below for updates >> [facility]...  ~  March 12, 2012:  1mo ROV & she reports that she is feeling better overall, energy improved, etc; she will be moving into [place] at [facility] is in SNF), son would like to try a mild antidepressant & we will start Zoloft 25mg /d to start...  Nurse noted that she was sl confused & questioned UTI> UA shows +Bact & WBC's c/w UTI so we will treat w/ Cipro250Bid pending culture data... We reviewed the following medical problems during today's office visit >>      COPD/ AB> on Advair250Bid & Singulair10, w/ Proair rescue prn; states breathing is OK w/o cough,  sput, ch in SOB/DOE- walks a lot w/ her walker & stable...    Cards> HBP, CAD, RBBB, Hx DVT> on Metoprolol25Bid & Coumadin by [name] clinic; BP=112/64 & she denies CP, palpit, ch is SOB, edema...    CHOL> on Lip20 & FLP 7/13 looks good w/ TChol 169, TG 137, HDL 63, LDL 79...    GI- GERD, Polyps> on Nexium 40mg /d + Ensure & Megace for appetite & wt gain- now at 97# but she admits to not taking the supplements regularly; followed by DrGessner for GI; she denies abd pain, n/v, c/d, or blood seen...    DJD, Back Pain, Osteoporosis> on calcium, MVI, Vit D 1000 u daily; uses NORCO10 & Tylenol as needed; she sees [name] every 3-18mo for ESI injections that really help according to her son...    Thrombocythemia> she is followed by [name] on Hydrea & his notes are reviewed w/ platelets counts holding at 400K range...    Anxiety/ Depression> they would like to try a mild antidepressant in conjunction w/ her move to [place]; Rx Zoloft25mg  to start & assess results...  We reviewed prob list, meds, xrays and labs> see below for updates >> LABS 9/13 by DrG:  Chems- wnl;  CBC- wnl w/ Plat=375K LABS 7/13:  FLP- at goals on Lip20;  TSH=2.10;  VitD=51  Problem List:  MACULAR DEGENERATION (ICD-362.50) - sees [name] for follow up- on Vitamin therapy...  ASTHMATIC BRONCHITIS & COPD - she was stable on Advair250 + Singulair10 w/ Prn Albuterol, but 8/12 DrESL stopped her Singulair & cut the ADVAIR to 100/50Bid; since then she reports that she's had to increase the Proair use to 3-4 times daily but she does not want to go back up on the Advair dose;  she still takes allergy shots every 2weeks at [facility]. ~  CXR 5/12 showed normal heart size, clear lungs w/ some basilar atx, T8 compression... ~  7/13:  Stable on Advair100Bid & back on Singulair10mg /d; using Proair rescue 1-2 times daily & she wants to continue as is... ~  11/13:  She is back on Advair250 per DrVanwinkle + Singulair10, Proair prn  & reports that her breathing is OK ~  1/14:  on Advair250Bid & Singulair10, w/ Proair rescue prn; states breathing is OK w/o cough, sput, ch in SOB/DOE- walks a lot w/ her walker & stable...  HYPERTENSION,  CORONARY ARTERY DISEASE,  RBBB - on METOPROLOL 25mg  Bid & off ASA now that she's on Coumadin... she has known non-obstructive CAD and followed by [name]... she was prev on Dyazide but stopped this med... ~  Nuclear Stress Test 9/09 was normal- no infarct, no ischemia, EF= 74%... ~  3/11 & 5/11 >> 2 episodes of NTG reactions w/ drop in BP (see above) therefore NTG discontinued... ~  3/12:  BP= 110/70 & she takes METOPROLOL 25mg  if her BP is > 150 (this is how she likes to do it); denies HA, fatigue, visual changes, recurrent CP, palipit, dizziness, syncope, dyspnea, edema, etc... ~  5/12 Hosp w/ left leg DVT> CXR showed norm heart size, clear lungs x atx at bases, old T8 compression;  EKG showed SBrady, RBBB; Coumadin started & ASA discontinued. ~  5/12:  BP= 118/60 & she is essent asymptomatic now... ~  9/12:  BP= 126/82 & stable on Metop25Bid + diet etc... ~  1/13:  BP= 108/64 & she remains essent asymptomatic; had f/u [name]- stable, no changes made... ~  7/13:  BP= 138/76 & she denies CP, palpit, dizzy, SOB, edema, etc... ~  11/13:  BP= 112/60 & she remains largely asymptomatic... ~  1/14: on Metoprolol25Bid & Coumadin by [name] clinic; BP=112/64 & she denies CP, palpit, ch is SOB, edema...   DVT>  On Coumadin started 5/12 Hosp & protimes monitored by [name] due to this & essential thrombocytosis... ~  She was Adm 5/4 - 07/03/10 w/ left leg DVT, unprovoked (hypercoag panel- pending), w/ hx underlying essential thrombocytosis on Hydroxyurea from [name];  Started on Coumadin w/ Lovenox bridge & early disch w/ protimes via visiting nurse & called to [name] who already checks CBC monthly.  HYPERCHOLESTEROLEMIA - on diet + LIPITOR 20mg /d now... tolerated well...  ~  FLP  1/08 showed TChol 198, TG 129, HDL 59, LDL 113... ~  FLP 5/09 showed TChol 168, TG 100, HDL 49, LDL 99... ~  FLP 4/10 on Lip10 showed TChol 183, TG 117, HDL 50, LDL 109... rec> same med, better diet. ~  FLP 4/11 on Lip10 showed TChol 223, Tg 87, HDL 62, LDL 152... rec incr Lip20. ~  FLP 7/13 on Lip20 showed TChol 169, TG 137, HDL 63, LDL 79...   GERD - on NEXIUM 40mg /d... ~  [name] did an EGD 4/08 showing GERD, esophagitis and Rx w/ NEXIUM 40/d (it helps)... ~  GI eval 4/12 DrGessner showed abn  Ba Esophagram w/ diffuse motility disorder & narrowing of distal esoph, plus laryngeal penetration... ~  EGD 4/12 by [name] showed a presbyesoph & spasm in the distal esoph w/o stricture, Savory dilatation done, mult sm gastric polyps noted...    COLONIC POLYPS - last colonoscopy was 4/08 by [name] showing 3mm polyp= tubular adenoma...   DEGENERATIVE JOINT DISEASE (ICD-715.90) BACK PAIN, LUMBAR - she has signif Back Pain and is followed in the pain clinic ([name] now & he has her on LORCET-HD 10/500 taking 1Qid); off prev Lyrica, Celebrex, Tramadol... ~  10/13:  S/p ESI L5-S1 for spinal stenosis by [name]... ~  1/14: on calcium, MVI, Vit D 1000 u daily; uses NORCO10 & Tylenol prn; she sees [name] every 3-81mo for ESI injections that really help according to her son...  OSTEOPOROSIS (ICD-733.00) - prev on Boniva 150mg /mo + Calcium & Vitamins... ~  BMD here 5/01 showed TScores -0.1 in Spine, & -1.4 in left FemNeck ~  BMD here 7/08 showed TScores +0.4 in Spine, & -1.6 in left FemNeck...  ~  labs 4/10 showed Vit D level = 34... rec> take Vit D 1000 u OTC daily. ~  9/12:  Her Boniva was stopped in the interim & she is not sure when (likely during her hosp)> rec to continue calcium, MVI, Vit D & needs f/u BMD.  TRANSIENT ISCHEMIC ATTACKS, HX OF - she has sm vessel disease on prev scans... Prev on 81mg  ECASA daily but this was stopped when she started on the Coumadin for DVT. ~  3/11: went to ER w/  episode of CP, s/p 3 NTG, developed left arm weakness, no speech or leg symptoms... she was off ASA for several days in anticipation of shot from [name]... ? NTG related, ?off ASA related, ?need for Plavix... decision made to stay on ASA, limit NTG. ~  5/12:  Adm w/ DVT & placed on Hep/ Coumadin therefore ASA was discontinued; Protimes done at [facility] & coordinated thru [name]'s office...  ANXIETY (ICD-300.00) - not currently on medication...  ESSENTIAL THROMBOCYTHEMIA (ICD-238.71) - noted to have rising platelet count & referred to [facility] 4/11 w/ Dx of Essential Thrombocytosis/ myeloproliferative disorder & started on HYDROXYUREA 500mg /d w/ titration from DrG... ~  labs 4/11 showed platlet count = 994K... Hydroxyurea started by Heme. ~  labs 5/11 showed platelet count = 602K ~  Labs 3/12 showed Hg= 12.2 (MCV=118), WBC= 5.7, Plat=294K ~  Labs in hosp 5/12 showed Hg down to 10.7, MCV= 116... ~  [name] continues to monitor labs & protimes monthly thru his clinic... ~  EPIC labs reviewed & 11/12 Hg=12.1 and Plat= 433K... ~  7/13:  EPIC reviewed & pt on stable dose of HYDREA w/ Platlets ~400K... ~  9/13:  F/u [name] on Hydrea for thrombocytosis & Coumadin for DVT...   Past Surgical History  Procedure Date  . Appendectomy   . Cholecystectomy   . Tah and bso 08/21/1995    Outpatient Encounter Prescriptions as of 03/12/2012  Medication Sig Dispense Refill  . albuterol (PROAIR HFA) 108 (90 BASE) MCG/ACT inhaler Inhale 2 puffs into the lungs every 6 (six) hours as needed for wheezing. As needed for wheezing  1 Inhaler  11  . atorvastatin (LIPITOR) 20 MG tablet Take 20 mg by mouth daily.       . Cholecalciferol (VITAMIN D3) 1000 UNITS CAPS Take 1 capsule by mouth daily.        Marland Kitchen EPIPEN 2-PAK 0.3 MG/0.3ML DEVI as needed.      Marland Kitchen  Fluticasone-Salmeterol (ADVAIR) 250-50 MCG/DOSE AEPB Inhale 1 puff into the lungs every 12 (twelve) hours.      Marland Kitchen HYDROcodone-acetaminophen  (NORCO) 10-325 MG per tablet Take 1 tablet by mouth every 6 (six) hours as needed.      . hydroxyurea (HYDREA) 500 MG capsule TAKE 2 CAPSULES BY MOUTH ON MONDAY (1000MG ) AND THEN TAKE 1 CAPSULE BY MOUTH EVERY OTHER DAY (500MG )  34 capsule  10  . megestrol (MEGACE) 40 MG/ML suspension Take 5 cc by mouth three times daily Take 5 cc by mouth three times daily  240 mL  6  . metoprolol tartrate (LOPRESSOR) 25 MG tablet TAKE 1 TABLET (25 MG TOTAL) BY MOUTH 2 (TWO) TIMES DAILY. AS DIRECTED FOR HEART AND BLOOD PRESSURE  60 tablet  3  . montelukast (SINGULAIR) 10 MG tablet TAKE 1 TABLET BY MOUTH ONCE A DAY  30 tablet  3  . Multiple Vitamin (MULTIVITAMIN PO) Take 1 tablet by mouth daily.        . Multiple Vitamins-Minerals (MACULAR VITAMIN BENEFIT PO) Take 1 tablet by mouth daily.      Marland Kitchen NEXIUM 40 MG capsule TAKE 1 CAPSULE (40 MG TOTAL) BY MOUTH DAILY BEFORE BREAKFAST.  30 capsule  10  . nitroGLYCERIN (NITROSTAT) 0.4 MG SL tablet Place 0.4 mg under the tongue every 5 (five) minutes as needed.        . Nutritional Supplements (ENSURE ENLIVE) LIQD Take 1 Can by mouth daily.       Marland Kitchen warfarin (COUMADIN) 2.5 MG tablet Take 2.5 mg by mouth once a day  30 tablet  5    Allergies  Allergen Reactions  . Sulfonamide Derivatives     Pt is unsure of reaction    Current Medications, Allergies, Past Medical History, Past Surgical History, Family History, and Social History were reviewed in [employer] record.    Review of Systems        See HPI - all other systems neg except as noted... The patient complains of decreased hearing and dyspnea on exertion.  The patient denies anorexia, fever, weight loss, weight gain, vision loss, hoarseness, chest pain, syncope, peripheral edema, prolonged cough, headaches, hemoptysis, abdominal pain, melena, hematochezia, severe indigestion/heartburn, hematuria, incontinence, muscle weakness, suspicious skin lesions, transient blindness, difficulty walking,  depression, unusual weight change, abnormal bleeding, enlarged lymph nodes, and angioedema.     Objective:   Physical Exam     WD, Thin, 79 y/o WF in NAD... GENERAL:  Alert & oriented; pleasant & cooperative... HEENT:  [place]/AT, EOM-wnl, PERRLA, EACs-clear, TMs-wnl, NOSE-clear, THROAT-clear & wnl. NECK:  Supple w/ fairROM; no JVD; normal carotid impulses w/o bruits; no thyromegaly or nodules palpated; no lymphadenopathy. CHEST:  Decr BS bilat & clear to P & A; without wheezes/ rales/ or rhonchi heard... HEART:  Regular Rhythm; without murmurs/ rubs/ or gallops detected...  ABDOMEN:  Thin, soft & nontender; normal bowel sounds; no organomegaly or masses palpated... EXT: without deformities, mild arthritic changes; no varicose veins/ +venous insuffic/ no edema. BACK:  kyphosis without focal tenderness... NEURO:  CN's intact; no focal neuro deficits; gait abn due to LBP... DERM:  No lesions noted; no rash etc...  RADIOLOGY DATA:  Reviewed in the EPIC EMR & discussed w/ the patient...  LABORATORY DATA:  Reviewed in the EPIC EMR & discussed w/ the patient...   Assessment & Plan:    DVT>  She is on Coumadin w/ protimes by visiting nurses & called to [name] who will be monitoring  this going forward since he already monitors her Hydroxyurea for the thrombocytosis...  Essential Thrombocytosis>  As above, stable on Hydroxyurea, monitored by DrG...  COPD>  She feels she is stable on the Advair250 & Singulair;  Using Proair rescue 1-2 times daily & she is comfortable w/ this...  HBP/ CAD>  Stable on Metoprolol, no angina etc...  CHOL>  FLP on the Lip20 looks good- continue same...  DJD/ Osteoporosis>  She is followed by [name]; also needs f/u BMD when able...  Anxiety/ Depression>  They request meds Rx trial 7 we prescribed Zoloft 25mg /d & reassess response...  Other medical problems as noted...  Current UTI under treatment w/ Cipro...   Patient's Medications  New Prescriptions     SERTRALINE (ZOLOFT) 25 MG TABLET    Take 1 tablet (25 mg total) by mouth daily.  Previous Medications   ALBUTEROL (PROAIR HFA) 108 (90 BASE) MCG/ACT INHALER    Inhale 2 puffs into the lungs every 6 (six) hours as needed for wheezing. As needed for wheezing   ATORVASTATIN (LIPITOR) 20 MG TABLET    Take 20 mg by mouth daily.    CHOLECALCIFEROL (VITAMIN D3) 1000 UNITS CAPS    Take 1 capsule by mouth daily.     EPIPEN 2-PAK 0.3 MG/0.3ML DEVI    as needed.   FLUTICASONE-SALMETEROL (ADVAIR) 250-50 MCG/DOSE AEPB    Inhale 1 puff into the lungs every 12 (twelve) hours.   HYDROCODONE-ACETAMINOPHEN (NORCO) 10-325 MG PER TABLET    Take 1 tablet by mouth every 6 (six) hours as needed.   HYDROXYUREA (HYDREA) 500 MG CAPSULE    TAKE 2 CAPSULES BY MOUTH ON MONDAY (1000MG ) AND THEN TAKE 1 CAPSULE BY MOUTH EVERY OTHER DAY (500MG )   MEGESTROL (MEGACE) 40 MG/ML SUSPENSION    Take 5 cc by mouth three times daily Take 5 cc by mouth three times daily   METOPROLOL TARTRATE (LOPRESSOR) 25 MG TABLET    TAKE 1 TABLET (25 MG TOTAL) BY MOUTH 2 (TWO) TIMES DAILY. AS DIRECTED FOR HEART AND BLOOD PRESSURE   MONTELUKAST (SINGULAIR) 10 MG TABLET    TAKE 1 TABLET BY MOUTH ONCE A DAY   MULTIPLE VITAMIN (MULTIVITAMIN PO)    Take 1 tablet by mouth daily.     MULTIPLE VITAMINS-MINERALS (MACULAR VITAMIN BENEFIT PO)    Take 1 tablet by mouth daily.   NEXIUM 40 MG CAPSULE    TAKE 1 CAPSULE (40 MG TOTAL) BY MOUTH DAILY BEFORE BREAKFAST.   NITROGLYCERIN (NITROSTAT) 0.4 MG SL TABLET    Place 0.4 mg under the tongue every 5 (five) minutes as needed.     NUTRITIONAL SUPPLEMENTS (ENSURE ENLIVE) LIQD    Take 1 Can by mouth daily.    WARFARIN (COUMADIN) 2.5 MG TABLET    Take 2.5 mg by mouth once a day  Modified Medications   No medications on file  Discontinued Medications   No medications on file

## 2012-03-12 NOTE — Telephone Encounter (Signed)
Talked to patient gave her new appt for 03/19/12 r/s from 1/20 due to MD's on call day

## 2012-03-13 ENCOUNTER — Encounter: Payer: Self-pay | Admitting: Oncology

## 2012-03-13 ENCOUNTER — Telehealth: Payer: Self-pay | Admitting: Pulmonary Disease

## 2012-03-13 MED ORDER — CIPROFLOXACIN HCL 250 MG PO TABS: 250.0000 mg | ORAL_TABLET | Freq: Two times a day (BID) | ORAL | Status: DC

## 2012-03-13 NOTE — Telephone Encounter (Signed)
I spoke with [place]. She stated they received order for cipro 250 mg. They need this RX sent to [facility].   Per Leigh it was for cipro 250 mg 1 BID #14. I have sent in RX. Nothing further was needed.

## 2012-03-14 ENCOUNTER — Encounter: Payer: Self-pay | Admitting: Cardiology

## 2012-03-18 ENCOUNTER — Ambulatory Visit: Payer: Medicare Other | Admitting: Oncology

## 2012-03-18 ENCOUNTER — Other Ambulatory Visit: Payer: Medicare Other | Admitting: Lab

## 2012-03-19 ENCOUNTER — Ambulatory Visit: Payer: Medicare Other | Admitting: Pharmacist

## 2012-03-19 ENCOUNTER — Ambulatory Visit (HOSPITAL_BASED_OUTPATIENT_CLINIC_OR_DEPARTMENT_OTHER): Payer: Medicare Other | Admitting: Oncology

## 2012-03-19 ENCOUNTER — Encounter: Payer: Self-pay | Admitting: Pharmacist

## 2012-03-19 ENCOUNTER — Other Ambulatory Visit (HOSPITAL_BASED_OUTPATIENT_CLINIC_OR_DEPARTMENT_OTHER): Payer: Medicare Other | Admitting: Lab

## 2012-03-19 ENCOUNTER — Ambulatory Visit: Payer: Medicare Other | Admitting: Pulmonary Disease

## 2012-03-19 VITALS — BP 133/76 | HR 76 | Temp 98.2°F | Resp 18 | Ht 60.0 in | Wt 97.1 lb

## 2012-03-19 DIAGNOSIS — D473 Essential (hemorrhagic) thrombocythemia: Secondary | ICD-10-CM

## 2012-03-19 DIAGNOSIS — I82409 Acute embolism and thrombosis of unspecified deep veins of unspecified lower extremity: Secondary | ICD-10-CM

## 2012-03-19 DIAGNOSIS — Z7901 Long term (current) use of anticoagulants: Secondary | ICD-10-CM

## 2012-03-19 DIAGNOSIS — I10 Essential (primary) hypertension: Secondary | ICD-10-CM

## 2012-03-19 LAB — POCT INR: INR: 1

## 2012-03-19 LAB — CBC WITH DIFFERENTIAL/PLATELET
BASO%: 0.9 % (ref 0.0–2.0)
EOS%: 1 % (ref 0.0–7.0)
MCH: 36.5 pg — ABNORMAL HIGH (ref 25.1–34.0)
MCHC: 33.9 g/dL (ref 31.5–36.0)
MONO%: 4.6 % (ref 0.0–14.0)
RDW: 13.4 % (ref 11.2–14.5)
lymph#: 0.7 10*3/uL — ABNORMAL LOW (ref 0.9–3.3)

## 2012-03-19 LAB — MORPHOLOGY: PLT EST: INCREASED

## 2012-03-19 MED ORDER — WARFARIN SODIUM 2 MG PO TABS: 2.0000 mg | ORAL_TABLET | Freq: Every day | ORAL | Status: DC

## 2012-03-19 NOTE — Progress Notes (Signed)
INR = 1 Pt has been off Coumadin for 5 days in preparation for epidural today. Since pts INR was elevated on 2.5 mg/day, I will adjust dose down to 2 mg/day. I have spoken w/ Corey Harold at [facility].  Pts son also with the pt today & he will give his mother 2 mg today. Pt knows that the color of her new Coumadin will be purple.  Corey Harold will go tomorrow & change out the pill box. Marily Lente, Pharm.D.

## 2012-03-19 NOTE — Patient Instructions (Signed)
Change coumadin to 2 mg daily Repeat protime in 1 week and fax results to [phone] Continue Hydroxyurea 500 mg  2 caps on Mondays, 1 capsule other days of the week Continue monthly CBC Begin coated aspirin 81 mg PO daily Return visit with Nurse Practitioner on 05/16/12 at 11:45 AM

## 2012-03-19 NOTE — Progress Notes (Signed)
Note: pt will complete Cipro tomorrow (was on 7 day course). Marily Lente, Pharm.D.

## 2012-03-20 ENCOUNTER — Telehealth: Payer: Self-pay | Admitting: Oncology

## 2012-03-20 NOTE — Telephone Encounter (Signed)
Pt's son called moved appt to a later time same day 07/16/12 lab and ML

## 2012-03-20 NOTE — Telephone Encounter (Signed)
Mailed pt appt schedule for May 2014

## 2012-03-20 NOTE — Telephone Encounter (Signed)
lvm for pt advising on 5.20.14 appt.Marland KitchenMarland Kitchen

## 2012-03-21 NOTE — Progress Notes (Signed)
Hematology and Oncology Follow Up Visit  ASHLYNNE SHETTERLY [id number] 1921-01-28 79 y.o. 03/21/2012 9:02 AM   Principle Diagnosis: Encounter Diagnoses  Name Primary?  . Essential thrombocythemia Yes  . DVT (deep venous thrombosis)      Interim History:  Followup visit for this pleasant 89 year old woman [facility]. She is accompanied by her son. She is followed here for essential thrombocythemia and is on chronic oral chemotherapy with hydroxyurea. We also monitor her Coumadin status post unprovoked DVT left lower extremity May 2012 probably related to her underlying myeloproliferative disorder. We have had some difficulty getting her on a stable dose but the dose range is  becoming more narrow between 2 and 3 mg of Coumadin. She's had no interim medical problems. She denies any chest pain or dyspnea. No change in bowel habit. No leg swelling or pain. Blood counts have been well controlled on current dose of Hydrea 1000 mg on Mondays and 500 mg on other days of the week.  Medications: reviewed  Allergies:  Allergies  Allergen Reactions  . Sulfonamide Derivatives     Pt is unsure of reaction    Review of Systems: Constitutional:   No constitutional symptoms Respiratory: No cough or dyspnea Cardiovascular: See above   Gastrointestinal: See above Genito-Urinary: Recent urinary tract infection treated with Cipro which in turn affected her Coumadin levels Musculoskeletal: Chronic arthritis Neurologic: No headache or change in vision Skin: No rash or ecchymosis Remaining ROS negative.  Physical Exam: Blood pressure 133/76, pulse 76, temperature 98.2 F (36.8 C), temperature source Oral, resp. rate 18, height 5' (1.524 m), weight 97 lb 1.6 oz (44.044 kg). Wt Readings from Last 3 Encounters:  03/19/12 97 lb 1.6 oz (44.044 kg)  03/12/12 96 lb 12.8 oz (43.908 kg)  01/16/12 99 lb (44.906 kg)     General appearance: Frail elderly  woman HENNT: Pharynx no erythema, exudate, or ulcer Lymph nodes: No adenopathy Breasts: Lungs: Clear to auscultation resonant to percussion Heart: Regular rhythm, no murmur Abdomen: Soft nontender Extremities: No edema, no calf tenderness Vascular: No cyanosis Neurologic: No focal deficit Skin: No rash or ecchymosis  Lab Results: Lab Results  Component Value Date   WBC 7.9 03/19/2012   HGB 13.1 03/19/2012   HCT 38.5 03/19/2012   MCV 107.6* 03/19/2012   PLT 424* 03/19/2012     Chemistry      Component Value Date/Time   NA 139 11/21/2011 1330   NA 140 07/25/2011 1130   K 4.2 11/21/2011 1330   K 4.5 07/25/2011 1130   CL 107 11/21/2011 1330   CL 103 07/25/2011 1130   CO2 23 11/21/2011 1330   CO2 31 07/25/2011 1130   BUN 27.0* 11/21/2011 1330   BUN 20 07/25/2011 1130   CREATININE 1.1 11/21/2011 1330   CREATININE 0.84 07/25/2011 1130      Component Value Date/Time   CALCIUM 9.6 11/21/2011 1330   CALCIUM 9.4 07/25/2011 1130   ALKPHOS 46 11/21/2011 1330   ALKPHOS 50 07/25/2011 1130   AST 20 11/21/2011 1330   AST 22 07/25/2011 1130   ALT 16 11/21/2011 1330   ALT 16 07/25/2011 1130   BILITOT 0.50 11/21/2011 1330   BILITOT 0.5 07/25/2011 1130       Radiological Studies: No results found.  Impression and Plan: #1. Myeloproliferative disorder-essential thrombocythemia Well controlled on current dose of Hydrea. Plan: Continue the same  #2. 2 years status post left lower extremity DVT She remains  on chronic Coumadin anticoagulation. We had to hold the drug when her INR went into the supratherapeutic range while she was on Cipro. She is subtherapeutic today. We will resume at 2 mg daily.  #3. Asthma  #4. Degenerative arthritis of the spine  #5. Nonobstructive coronary artery disease.  #6. Hyperlipidemia.  #7. Essential hypertension    CC:. Dr. [name]; Dr. Valera Castle   Levert Feinstein, MD 1/23/20149:02 AM

## 2012-03-27 ENCOUNTER — Ambulatory Visit (HOSPITAL_BASED_OUTPATIENT_CLINIC_OR_DEPARTMENT_OTHER): Payer: Medicare Other | Admitting: Pharmacist

## 2012-03-27 DIAGNOSIS — I82409 Acute embolism and thrombosis of unspecified deep veins of unspecified lower extremity: Secondary | ICD-10-CM

## 2012-03-27 NOTE — Progress Notes (Signed)
INR = 1.7 (drawn at [facility]) Pt is now on Coumadin 2 mg; dose reduced on 03/19/12 after her INR had increased 03/11/12- prior to her epidural injection. Pt reports she has not missed any of her doses.  She takes what is set up for her in her pill box. I s/w Levy Sjogren, RN.  She confirms dose change = 2 mg daily. Pt will stay on this dose for another week.  Corey Harold has some conflicts next week w/ a patient outing & she is available Thurs 2/6 to go check Ms. Kriesel's INR for [place]. Marily Lente, Pharm.D.

## 2012-03-28 ENCOUNTER — Other Ambulatory Visit: Payer: Self-pay | Admitting: *Deleted

## 2012-03-28 DIAGNOSIS — Z8679 Personal history of other diseases of the circulatory system: Secondary | ICD-10-CM

## 2012-03-28 MED ORDER — ASPIRIN 81 MG PO TBEC: 81.0000 mg | DELAYED_RELEASE_TABLET | Freq: Every day | ORAL | Status: DC

## 2012-04-02 ENCOUNTER — Ambulatory Visit: Payer: Medicare Other | Admitting: Cardiology

## 2012-04-03 ENCOUNTER — Telehealth: Payer: Self-pay | Admitting: Pulmonary Disease

## 2012-04-04 ENCOUNTER — Ambulatory Visit: Payer: Self-pay | Admitting: Pharmacist

## 2012-04-04 DIAGNOSIS — I82409 Acute embolism and thrombosis of unspecified deep veins of unspecified lower extremity: Secondary | ICD-10-CM

## 2012-04-04 NOTE — Patient Instructions (Signed)
Take 2 mg daily.  Repeat INR 04/11/12 at [facility].

## 2012-04-04 NOTE — Progress Notes (Signed)
INR now at Goal: 2.3. No bleeding issues or missed doses noted. Pt is overall doing well and son (Gene) states she is good but "tired". No changes necessary will continue on 2 mg daily and check INR again in 1 week with [facility]. s/w Levy Sjogren, RN and confirmed this with her. Corey Harold stated that patient may be transitioning to assisted living which may create some changes for checking INRs.

## 2012-04-04 NOTE — Telephone Encounter (Signed)
Forms completed by SN and i have made a copy and placed in the scan folder.  Called number given and the facility is aware. Nothing further is needed.

## 2012-04-05 ENCOUNTER — Telehealth: Payer: Self-pay | Admitting: Pulmonary Disease

## 2012-04-05 NOTE — Telephone Encounter (Signed)
Called and spoke with santana and she is aware that the fl2 are up front and ready to be picked up.  She will have the transportation driver come by and pick these up. Nothing further is needed.

## 2012-04-09 ENCOUNTER — Telehealth: Payer: Self-pay | Admitting: *Deleted

## 2012-04-09 NOTE — Telephone Encounter (Signed)
Spoke with Santana/[facility] & report given to stay on same dose of hydrea per Dr. Cyndie Chime = 1000 mg on Mondays & then 1 qod.  Dose verified with Corey Harold & she reports that pt has moved to Assisted Living from Independent Living today & they will cont to do monthly labs.

## 2012-04-11 ENCOUNTER — Ambulatory Visit: Payer: Self-pay | Admitting: Pharmacist

## 2012-04-11 DIAGNOSIS — I82409 Acute embolism and thrombosis of unspecified deep veins of unspecified lower extremity: Secondary | ICD-10-CM

## 2012-04-11 LAB — POCT INR: INR: 2.9

## 2012-04-11 NOTE — Progress Notes (Signed)
INR today slightly supratherapeutic (2.9) on 2mg  daily. Spoke with RN at [facility] about her INR.  They deny any problems with anticoagulation.   Patient has been transferred to an assisted living apartment in [facility], and now a Charity fundraiser is responsible for administering all of Sherri Crawford's medications.  Her new nurses can be reached at phone # [phone] ext [phone].  Gave verbal orders and faxed written orders (fax [phone]) for patient to take 1mg  today, then resume 2mg  daily.  Recheck INR on Wednesday, 04/17/12 at [facility].

## 2012-04-23 ENCOUNTER — Ambulatory Visit: Payer: Self-pay | Admitting: Pharmacist

## 2012-04-23 DIAGNOSIS — I82409 Acute embolism and thrombosis of unspecified deep veins of unspecified lower extremity: Secondary | ICD-10-CM

## 2012-04-23 NOTE — Progress Notes (Signed)
INR therapeutic today (2.4) on 2mg  daily. No changes in meds or diet reported by [facility] or pt herself.  Since [facility] RNs are now administering all medications, she has not missed any doses. Will continue current dose, and recheck INR in 1 week at [facility] (phone # [phone] ext [phone])

## 2012-04-24 ENCOUNTER — Encounter: Payer: Self-pay | Admitting: Oncology

## 2012-04-30 ENCOUNTER — Ambulatory Visit (HOSPITAL_BASED_OUTPATIENT_CLINIC_OR_DEPARTMENT_OTHER): Payer: Medicare Other | Admitting: Pharmacist

## 2012-04-30 DIAGNOSIS — I82409 Acute embolism and thrombosis of unspecified deep veins of unspecified lower extremity: Secondary | ICD-10-CM

## 2012-04-30 NOTE — Progress Notes (Signed)
INR = 2.5 on Coumadin 2.5 mg/day Pt reports no problems.  She has had no med changes.  Doing well. INR at goal.  No change to Coumadin dose necessary. I s/w Rosanne, RN & gave her verbal order & also faxed order for next month's INR. Ebony Hail, Pharm.D., CPP 04/30/2012

## 2012-05-02 ENCOUNTER — Encounter: Payer: Self-pay | Admitting: Cardiology

## 2012-05-09 DIAGNOSIS — R635 Abnormal weight gain: Secondary | ICD-10-CM

## 2012-05-09 HISTORY — DX: Abnormal weight gain: R63.5

## 2012-05-10 DIAGNOSIS — K5790 Diverticulosis of intestine, part unspecified, without perforation or abscess without bleeding: Secondary | ICD-10-CM

## 2012-05-10 HISTORY — DX: Diverticulosis of intestine, part unspecified, without perforation or abscess without bleeding: K57.90

## 2012-05-14 ENCOUNTER — Encounter: Payer: Self-pay | Admitting: Cardiology

## 2012-05-15 ENCOUNTER — Ambulatory Visit: Payer: Medicare Other | Admitting: Pulmonary Disease

## 2012-05-27 ENCOUNTER — Encounter: Payer: Self-pay | Admitting: Cardiology

## 2012-05-27 ENCOUNTER — Ambulatory Visit (INDEPENDENT_AMBULATORY_CARE_PROVIDER_SITE_OTHER): Payer: Medicare Other | Admitting: Cardiology

## 2012-05-27 VITALS — BP 106/58 | HR 81 | Ht 60.0 in | Wt 114.0 lb

## 2012-05-27 DIAGNOSIS — I10 Essential (primary) hypertension: Secondary | ICD-10-CM

## 2012-05-27 DIAGNOSIS — I2510 Atherosclerotic heart disease of native coronary artery without angina pectoris: Secondary | ICD-10-CM

## 2012-05-27 NOTE — Assessment & Plan Note (Signed)
Stable. Continue secondary preventative therapy. Return to cardiology when necessary.

## 2012-05-27 NOTE — Patient Instructions (Signed)
Your physician recommends that you continue on your current medications as directed. Please refer to the Current Medication list given to you today.  Your physician recommends that you schedule a follow-up appointment in: as needed  

## 2012-05-27 NOTE — Progress Notes (Signed)
HPI Sherri Crawford presents today for evaluation and management of her chronic coronary disease. [facility] and has become progressively demented. She has no complaints today. He says everything is okay.  Meds reviewed. She is now on Coumadin for a history of a DVT with Dr Cyndie Chime. She denies any falls. She uses a walker.  Past Medical History  Diagnosis Date  . Macular degeneration   . COPD (chronic obstructive pulmonary disease)   . Hypertension   . Coronary artery disease   . Hypercholesterolemia   . GERD (gastroesophageal reflux disease)   . Asthmatic bronchitis   . History of colonic polyps   . DJD (degenerative joint disease)   . Lumbar back pain   . Osteoporosis   . History of transient ischemic attack (TIA)   . Anxiety   . Essential thrombocythemia     Current Outpatient Prescriptions  Medication Sig Dispense Refill  . albuterol (PROAIR HFA) 108 (90 BASE) MCG/ACT inhaler Inhale 2 puffs into the lungs every 6 (six) hours as needed for wheezing. As needed for wheezing  1 Inhaler  11  . aspirin 81 MG EC tablet Take 1 tablet (81 mg total) by mouth daily. Swallow whole.  OK to take despite coumadin.  Script given to pt 03/19/12  100 tablet  3  . atorvastatin (LIPITOR) 20 MG tablet Take 20 mg by mouth daily.       . Cholecalciferol (VITAMIN D3) 1000 UNITS CAPS Take 1 capsule by mouth daily.        Marland Kitchen EPIPEN 2-PAK 0.3 MG/0.3ML DEVI as needed.      Donald Prose (FLORA-Q) CAPS Take by mouth as directed.      . Fluticasone-Salmeterol (ADVAIR) 250-50 MCG/DOSE AEPB Inhale 1 puff into the lungs every 12 (twelve) hours.      Marland Kitchen HYDROcodone-acetaminophen (NORCO) 10-325 MG per tablet Take 1 tablet by mouth every 6 (six) hours as needed.      . hydroxyurea (HYDREA) 500 MG capsule TAKE 2 CAPSULES BY MOUTH ON MONDAY (1000MG ) AND THEN TAKE 1 CAPSULE BY MOUTH EVERY OTHER DAY (500MG )  34 capsule  10  . megestrol (MEGACE) 40 MG/ML suspension Take 5 cc by mouth three times daily  Take 5 cc by mouth three times daily  240 mL  6  . metoprolol tartrate (LOPRESSOR) 25 MG tablet TAKE 1 TABLET (25 MG TOTAL) BY MOUTH 2 (TWO) TIMES DAILY. AS DIRECTED FOR HEART AND BLOOD PRESSURE  60 tablet  3  . montelukast (SINGULAIR) 10 MG tablet TAKE 1 TABLET BY MOUTH ONCE A DAY  30 tablet  3  . moxifloxacin (AVELOX) 400 MG tablet Take 400 mg by mouth as directed.      . Multiple Vitamin (MULTIVITAMIN PO) Take 1 tablet by mouth daily.        . Multiple Vitamins-Minerals (MACULAR VITAMIN BENEFIT PO) Take 1 tablet by mouth daily.      Marland Kitchen NEXIUM 40 MG capsule TAKE 1 CAPSULE (40 MG TOTAL) BY MOUTH DAILY BEFORE BREAKFAST.  30 capsule  10  . nitroGLYCERIN (NITROSTAT) 0.4 MG SL tablet Place 0.4 mg under the tongue every 5 (five) minutes as needed.        . Nutritional Supplements (ENSURE ENLIVE) LIQD Take 1 Can by mouth daily.       . sertraline (ZOLOFT) 25 MG tablet Take 1 tablet (25 mg total) by mouth daily.  30 tablet  11  . warfarin (COUMADIN) 2 MG tablet Take 1 tablet (  2 mg total) by mouth daily.  30 tablet  3  . warfarin (COUMADIN) 2.5 MG tablet Take 2.5 mg by mouth once a day  30 tablet  5   No current facility-administered medications for this visit.    Allergies  Allergen Reactions  . Sulfonamide Derivatives     Pt is unsure of reaction    Family History  Problem Relation Age of Onset  . Colon cancer Son 35  . Crohn's disease Son   . Cancer Maternal Aunt     History   Social History  . Marital Status: Married    Spouse Name: william    Number of Children: 3  . Years of Education: N/A   Occupational History  . retired     Investment banker, corporate work  .     Social History Main Topics  . Smoking status: Never Smoker   . Smokeless tobacco: Never Used  . Alcohol Use: Yes  . Drug Use: No  . Sexually Active: Not on file   Other Topics Concern  . Not on file   Social History Narrative  . No narrative on file    ROS ALL NEGATIVE EXCEPT THOSE NOTED IN HPI  PE  General  Appearance: well developed, well nourished in no acute distress, elderly and frail HEENT: symmetrical face, PERRLA, good dentition  Neck: no JVD, thyromegaly, or adenopathy, trachea midline Chest: symmetric without deformity Cardiac: PMI non-displaced, RRR, normal S1, S2, no gallop or murmur Lung: clear to ausculation and percussion Vascular: all pulses full without bruits  Abdominal: nondistended, nontender, good bowel sounds, no HSM, no bruits Extremities: no cyanosis, clubbing or edema, no sign of DVT, no varicosities  Skin: normal color, no rashes Neuro: alert and oriented x 3, non-focal Pysch: normal affect  EKG Normal sinus rhythm, normal EKG  BMET    Component Value Date/Time   NA 139 11/21/2011 1330   NA 140 07/25/2011 1130   K 4.2 11/21/2011 1330   K 4.5 07/25/2011 1130   CL 107 11/21/2011 1330   CL 103 07/25/2011 1130   CO2 23 11/21/2011 1330   CO2 31 07/25/2011 1130   GLUCOSE 139* 11/21/2011 1330   GLUCOSE 101* 07/25/2011 1130   BUN 27.0* 11/21/2011 1330   BUN 20 07/25/2011 1130   CREATININE 1.1 11/21/2011 1330   CREATININE 0.84 07/25/2011 1130   CALCIUM 9.6 11/21/2011 1330   CALCIUM 9.4 07/25/2011 1130   GFRNONAA >60 07/02/2010 0500   GFRAA  Value: >60        The eGFR has been calculated using the MDRD equation. This calculation has not been validated in all clinical situations. eGFR's persistently <60 mL/min signify possible Chronic Kidney Disease. 07/02/2010 0500    Lipid Panel     Component Value Date/Time   CHOL 169 09/13/2011 1023   TRIG 137.0 09/13/2011 1023   HDL 62.80 09/13/2011 1023   CHOLHDL 3 09/13/2011 1023   VLDL 27.4 09/13/2011 1023   LDLCALC 79 09/13/2011 1023    CBC    Component Value Date/Time   WBC 7.9 03/19/2012 1558   WBC 6.5 07/02/2010 1124   RBC 3.58* 03/19/2012 1558   RBC 2.77* 07/02/2010 1124   HGB 13.1 03/19/2012 1558   HGB 10.7* 07/02/2010 1124   HCT 38.5 03/19/2012 1558   HCT 32.2* 07/02/2010 1124   PLT 424* 03/19/2012 1558   PLT 261 07/02/2010 1124   MCV  107.6* 03/19/2012 1558   MCV 116.2* 07/02/2010 1124   MCH 36.5* 03/19/2012  1558   MCH 38.6* 07/02/2010 1124   MCHC 33.9 03/19/2012 1558   MCHC 33.2 07/02/2010 1124   RDW 13.4 03/19/2012 1558   RDW 13.2 07/02/2010 1124   LYMPHSABS 0.7* 03/19/2012 1558   LYMPHSABS 1.8 07/02/2010 0500   MONOABS 0.4 03/19/2012 1558   MONOABS 0.5 07/02/2010 0500   EOSABS 0.1 03/19/2012 1558   EOSABS 0.1 07/02/2010 0500   BASOSABS 0.1 03/19/2012 1558   BASOSABS 0.0 07/02/2010 0500

## 2012-05-28 ENCOUNTER — Ambulatory Visit (HOSPITAL_BASED_OUTPATIENT_CLINIC_OR_DEPARTMENT_OTHER): Payer: Medicare Other | Admitting: Pharmacist

## 2012-05-28 DIAGNOSIS — I82409 Acute embolism and thrombosis of unspecified deep veins of unspecified lower extremity: Secondary | ICD-10-CM

## 2012-05-28 DIAGNOSIS — I82402 Acute embolism and thrombosis of unspecified deep veins of left lower extremity: Secondary | ICD-10-CM

## 2012-05-28 NOTE — Progress Notes (Signed)
INR = 1.8 on Coumadin 2 mg daily Pt is on Avelox from 05/26/12 through 06/01/12. Pt was not aware she had any infection but RN noted addition of Avelox on faxed lab sheet. Avelox potentially may cause increase in INR. No change in Coumadin dose for now since INR is a little low today.  Recheck INR in 10 days (will be off Avelox at that point). I have communicated plan w/ pt & her RN, Rosann.  I also faxed the order sheet back to Rosann at [facility]. Ebony Hail, Pharm.D., CPP 05/28/2012@2 :09 PM

## 2012-05-30 ENCOUNTER — Encounter: Payer: Self-pay | Admitting: Oncology

## 2012-05-31 ENCOUNTER — Non-Acute Institutional Stay: Payer: Medicare Other | Admitting: Nurse Practitioner

## 2012-05-31 DIAGNOSIS — L02419 Cutaneous abscess of limb, unspecified: Secondary | ICD-10-CM

## 2012-05-31 DIAGNOSIS — L03119 Cellulitis of unspecified part of limb: Secondary | ICD-10-CM

## 2012-05-31 DIAGNOSIS — L03116 Cellulitis of left lower limb: Secondary | ICD-10-CM

## 2012-05-31 DIAGNOSIS — J441 Chronic obstructive pulmonary disease with (acute) exacerbation: Secondary | ICD-10-CM

## 2012-05-31 NOTE — Progress Notes (Signed)
Subjective:    Patient ID: Sherri Crawford, female    DOB: March 10, 1920, 79 y.o.   MRN: [id number]  HPI   LLE warmth, tenderness, swelling, erythematous, hx of DVT LLE. C/o SOB and cough, CXR was unremarkable, but hx of COPD-- Avelox 400mg  started for respiratory and cellulitis.  HTN UNSPECIFIED    controlled, takes Metoprolol.  CAD    Atorvastatin and prn NTG MEMORY LOSS    MMSE 19/30 04/2012, Namenda started ANXIETY    stable on Sertraline  ESSENTIAL THROMBOCYTHEMIA    takes Hydroxyurea since 4/2011ASTHMA W/ COPD    stable. Takes Albuterol, Salmeterol, Fluticasone Inh. Montelukast. Epipen.  HYPERLIPIDEMIA    LDL 79 09/12/12, takes Atorvastatin 20mg  GERD The patient's dyspeptic symptoms remain stable.  Takes Nexium. OSTEOARTHRITIS    multiple sites, take hydrocodone/acetaminophen.  DVT  LLE   chronic Coumadin started 06/2010 WEIGHT LOSS, ABN    stabilized since admitted to AL-off  Megace.   Review of Systems  Constitutional: Negative for fever, diaphoresis, activity change, appetite change, fatigue and unexpected weight change.  HENT: Positive for hearing loss. Negative for congestion, rhinorrhea, sneezing, neck pain, voice change and sinus pressure.   Eyes: Negative.   Respiratory: Positive for cough and shortness of breath. Negative for choking, chest tightness and wheezing.   Cardiovascular: Positive for leg swelling.  Gastrointestinal: Negative for nausea, vomiting, abdominal pain, diarrhea and constipation.  Endocrine: Negative.   Genitourinary: Negative for dysuria, urgency, frequency and flank pain.  Musculoskeletal: Positive for back pain, arthralgias and gait problem (walker). Negative for myalgias and joint swelling.  Skin: Positive for rash (LLE erythematous, swelling, tenderness, and warmth). Negative for wound.  Neurological: Negative for tremors, seizures, speech difficulty, weakness and numbness.  Hematological: Negative.   Psychiatric/Behavioral: Positive for confusion  (dementia, newly started Namenda). Negative for hallucinations, behavioral problems, sleep disturbance, dysphoric mood, decreased concentration and agitation. The patient is not nervous/anxious.        Objective:   Physical Exam  Constitutional: She is oriented to person, place, and time. She appears well-developed and well-nourished. No distress.  HENT:  Head: Normocephalic and atraumatic.  Eyes: Conjunctivae and EOM are normal. Pupils are equal, round, and reactive to light.  Neck: Normal range of motion. Neck supple. No JVD present. No thyromegaly present.  Cardiovascular: Normal rate, regular rhythm and normal heart sounds.   Pulmonary/Chest: Effort normal. She has decreased breath sounds. She has no wheezes. She has no rales.  Abdominal: Soft. Bowel sounds are normal. There is no tenderness.  Musculoskeletal: Normal range of motion. She exhibits no edema and no tenderness.  Lymphadenopathy:    She has no cervical adenopathy.  Neurological: She is alert and oriented to person, place, and time. She has normal reflexes. She displays normal reflexes. No cranial nerve deficit. She exhibits normal muscle tone. Coordination normal.  Skin: Skin is warm and dry. No rash noted. She is not diaphoretic. There is erythema (LLE).  Psychiatric: She has a normal mood and affect. Her mood appears not anxious. Her affect is not angry, not blunt, not labile and not inappropriate. Her speech is not delayed, not tangential and not slurred. She is slowed. She is not agitated, not aggressive, not withdrawn and not combative. Thought content is not paranoid and not delusional. Cognition and memory are impaired. She does not express impulsivity or inappropriate judgment. She does not exhibit a depressed mood. She exhibits abnormal recent memory.     Procedures: 07/25/11 X-ray rib  1.  No acute right  rib fracture detected.  Please note that nondisplaced acute rib fractures can be occult on radiographs.  2.   Chronic single level of mid thoracic spine compression fracture, unchanged, likely T8.  3.  Stable mild thoracolumbar scoliosis. 05/27/12 Venous Duplex Doppler Ultrasound  No evidence of deep venous thrombosis 05/29/12 CXR  No radiographic evidence for pneumonitis or suspicious nodule.   Lab Reviewed:  09/13/11 cholesterol 169, triglycerides 137.0, HDL 62.80, LDL 79 05/14/12 CBC wbc 7.9, Hgb 11.7, Hct 35.4, plt 349  CMP Na 138, K 4.8, glucose 81, Bun 31, creatinine 0.98, Ca 8.9  TSH 3.131     Assessment & Plan:  Patient Active Problem List  Diagnosis  . COLONIC POLYPS  . Essential thrombocythemia  . HYPERCHOLESTEROLEMIA  . MACULAR DEGENERATION  . Essential hypertension  . CORONARY ARTERY DISEASE  . ASTHMATIC BRONCHITIS, ACUTE  . COPD with asthma  . GERD  . DEGENERATIVE JOINT DISEASE  . BACK PAIN, LUMBAR  .  TRANSIENT ISCHEMIC ATTACKS, HX OF No new episodes   . Dysphagia, unspecified Diet modification.   . Edema of leg Mainly LLE--cellulitis aggravated the baseline edema  . Left leg DVT Chronic edema  . DVT (deep venous thrombosis) LLE  . Anxiety and depression stable  . Cellulitis of leg, left Complete Avelox  COPD cough and SOB--CXR unremarkable--complete treatment with Avelox.

## 2012-06-07 ENCOUNTER — Ambulatory Visit: Payer: Self-pay | Admitting: Pharmacist

## 2012-06-07 DIAGNOSIS — I82402 Acute embolism and thrombosis of unspecified deep veins of left lower extremity: Secondary | ICD-10-CM

## 2012-06-07 LAB — POCT INR: INR: 2.1

## 2012-06-07 NOTE — Patient Instructions (Addendum)
INR at goal  No Changes  Continue same dose of 2 mg daily  Recheck INR at [facility] in 1 month on 07/05/12

## 2012-06-07 NOTE — Progress Notes (Signed)
Received fax from [facility] with patient's INR reading which is in range. Called and spoke to RN and gave verbal orders to continue same dose of 2 mg daily and to have INR checked again at [facility] on 07/05/12. Also called patient cell phone and left a voicemail to call back. [facility] Fax machine was having issues today and fax would not go through.

## 2012-07-04 ENCOUNTER — Ambulatory Visit: Payer: Self-pay | Admitting: Pharmacist

## 2012-07-04 LAB — POCT INR: INR: 1.44

## 2012-07-04 NOTE — Progress Notes (Signed)
INR = 1.44 (drawn at [facility] today) Pt reports both legs are "breaking out"/rash/itching.  No pain.  There is swelling.  She had cellulitis in April.  Possible that could have relapsed.  Pt reports that she is not on abx now. She has not missed any doses of Coumadin since the RN gives her the meds daily. I will have them increase her dose of Coumadin up to 2.5 mg daily.  She has been on this dose safely in the past.  If abx are started for cellulitis we will need to consider the effect on INR. Recheck INR in 2 weeks. Faxed order to [facility] (fax# [phone]; ph# [phone]) NO CHARGE- phone encounter Ebony Hail, Pharm.D., CPP 07/04/2012@4 :18 PM

## 2012-07-05 ENCOUNTER — Encounter: Payer: Self-pay | Admitting: Pharmacist

## 2012-07-05 NOTE — Progress Notes (Signed)
Received orders to be signed from [facility] from 07/04/12. The orders were to increase coumadin to 2.5mg  daily and recheck INR on 07/18/2012. Orders were signed by Ebony Hail, PharmD, CPP and faxed to [facility] at [phone]. Fax confirmation received.

## 2012-07-10 ENCOUNTER — Ambulatory Visit: Payer: Self-pay | Admitting: Pulmonary Disease

## 2012-07-16 ENCOUNTER — Telehealth: Payer: Self-pay | Admitting: Oncology

## 2012-07-16 ENCOUNTER — Other Ambulatory Visit: Payer: Medicare Other | Admitting: Lab

## 2012-07-16 ENCOUNTER — Ambulatory Visit: Payer: Medicare Other | Admitting: Nurse Practitioner

## 2012-07-16 ENCOUNTER — Ambulatory Visit (HOSPITAL_BASED_OUTPATIENT_CLINIC_OR_DEPARTMENT_OTHER): Payer: Medicare Other | Admitting: Nurse Practitioner

## 2012-07-16 ENCOUNTER — Other Ambulatory Visit (HOSPITAL_BASED_OUTPATIENT_CLINIC_OR_DEPARTMENT_OTHER): Payer: Medicare Other | Admitting: Lab

## 2012-07-16 VITALS — BP 126/64 | HR 67 | Temp 98.3°F | Resp 18 | Ht 60.0 in | Wt 112.8 lb

## 2012-07-16 DIAGNOSIS — Z7901 Long term (current) use of anticoagulants: Secondary | ICD-10-CM

## 2012-07-16 DIAGNOSIS — D473 Essential (hemorrhagic) thrombocythemia: Secondary | ICD-10-CM

## 2012-07-16 DIAGNOSIS — D649 Anemia, unspecified: Secondary | ICD-10-CM

## 2012-07-16 DIAGNOSIS — Z86718 Personal history of other venous thrombosis and embolism: Secondary | ICD-10-CM

## 2012-07-16 DIAGNOSIS — I82409 Acute embolism and thrombosis of unspecified deep veins of unspecified lower extremity: Secondary | ICD-10-CM

## 2012-07-16 LAB — CBC WITH DIFFERENTIAL/PLATELET
BASO%: 1.1 % (ref 0.0–2.0)
HCT: 34.2 % — ABNORMAL LOW (ref 34.8–46.6)
MCHC: 33.6 g/dL (ref 31.5–36.0)
MONO#: 0.4 10*3/uL (ref 0.1–0.9)
NEUT%: 63.7 % (ref 38.4–76.8)
RDW: 15.6 % — ABNORMAL HIGH (ref 11.2–14.5)
WBC: 5.4 10*3/uL (ref 3.9–10.3)
lymph#: 1.3 10*3/uL (ref 0.9–3.3)

## 2012-07-16 LAB — COMPREHENSIVE METABOLIC PANEL (CC13)
AST: 23 U/L (ref 5–34)
BUN: 23 mg/dL (ref 7.0–26.0)
Calcium: 9 mg/dL (ref 8.4–10.4)
Chloride: 106 mEq/L (ref 98–107)
Creatinine: 1.1 mg/dL (ref 0.6–1.1)

## 2012-07-16 LAB — PROTIME-INR
INR: 2 (ref 2.00–3.50)
Protime: 24 Seconds — ABNORMAL HIGH (ref 10.6–13.4)

## 2012-07-16 LAB — LACTATE DEHYDROGENASE (CC13): LDH: 180 U/L (ref 125–245)

## 2012-07-16 NOTE — Progress Notes (Signed)
OFFICE PROGRESS NOTE  Interval history:  Sherri Crawford is an 79 year old woman with essential thrombocythemia. She is maintained on hydroxyurea at a current dose of 500 mg every other day. She also has a history of an unprovoked DVT left lower extremity May 2012 probably related to the underlying myeloproliferative disorder. She is maintained on Coumadin followed through our office.  She is seen today for scheduled followup. Overall she feels well. She denies any bleeding. No shortness of breath. No nausea or vomiting. No mouth sores. She notes mild swelling of the lower legs and redness at the anterior lower legs bilaterally.   Objective: Blood pressure 126/64, pulse 67, temperature 98.3 F (36.8 C), temperature source Oral, resp. rate 18, height 5' (1.524 m), weight 112 lb 12.8 oz (51.166 kg).  No thrush or ulcerations. No palpable cervical, supraclavicular or axillary lymph nodes. Lungs are clear. Regular cardiac rhythm. Abdomen soft and nontender. No organomegaly. Trace lower leg edema bilaterally. Mild erythema at the low pretibial regions left greater than right. External hemorrhoids. Stool negative for occult blood.  Lab Results: Lab Results  Component Value Date   WBC 5.4 07/16/2012   HGB 11.5* 07/16/2012   HCT 34.2* 07/16/2012   MCV 97.6 07/16/2012   PLT 345 07/16/2012    Chemistry:    Chemistry      Component Value Date/Time   NA 140 07/16/2012 1417   NA 140 07/25/2011 1130   K 4.4 07/16/2012 1417   K 4.5 07/25/2011 1130   CL 106 07/16/2012 1417   CL 103 07/25/2011 1130   CO2 26 07/16/2012 1417   CO2 31 07/25/2011 1130   BUN 23.0 07/16/2012 1417   BUN 20 07/25/2011 1130   CREATININE 1.1 07/16/2012 1417   CREATININE 0.84 07/25/2011 1130      Component Value Date/Time   CALCIUM 9.0 07/16/2012 1417   CALCIUM 9.4 07/25/2011 1130   ALKPHOS 72 07/16/2012 1417   ALKPHOS 50 07/25/2011 1130   AST 23 07/16/2012 1417   AST 22 07/25/2011 1130   ALT 17 07/16/2012 1417   ALT 16 07/25/2011 1130   BILITOT 0.32 07/16/2012 1417   BILITOT 0.5 07/25/2011 1130       Studies/Results: No results found.  Medications: I have reviewed the patient's current medications.  Assessment/Plan:  1. Myeloproliferative disorder/essential thrombocythemia. Platelet count controlled on current dose of Hydrea. 2. Left lower extremity DVT. She is maintained on long-term Coumadin anticoagulation. 3. Mild anemia. Stool tested negative for occult blood in the office today. We will check iron studies with the next lab draw. 4. Asthma. 5. Degenerative arthritis of the spine. 6. Nonobstructive coronary artery disease. 7. Hyperlipidemia. 8. Essential hypertension.  Disposition-Sherri Crawford will continue Hydrea and Coumadin at the current doses. We are checking CBCs on a 4 week schedule. She has developed a mild anemia. As noted above, we are ordering iron studies with the next lab draw. She will return for a followup visit in 4 months. She will contact the office in the interim with any problems.  Plan reviewed with Dr. Cyndie Chime.  [name] ANP/GNP-BC    CC Dr. [name] and Dr. Valera Castle

## 2012-07-16 NOTE — Telephone Encounter (Signed)
gv and printed appt sched and avs for pt  °

## 2012-07-18 LAB — POCT INR: INR: 2.02

## 2012-07-19 ENCOUNTER — Ambulatory Visit (HOSPITAL_BASED_OUTPATIENT_CLINIC_OR_DEPARTMENT_OTHER): Payer: Medicare Other | Admitting: Pharmacist

## 2012-07-19 DIAGNOSIS — I82409 Acute embolism and thrombosis of unspecified deep veins of unspecified lower extremity: Secondary | ICD-10-CM

## 2012-07-19 DIAGNOSIS — I82402 Acute embolism and thrombosis of unspecified deep veins of left lower extremity: Secondary | ICD-10-CM

## 2012-07-19 HISTORY — DX: Acute embolism and thrombosis of unspecified deep veins of left lower extremity: I82.402

## 2012-07-19 NOTE — Progress Notes (Signed)
INR within goal today. No problems regarding anticoagulation to report.  No changes in diet, medications, etc. No missed doses. Continue 2.5 mg daily.  Recheck INR in 3 weeks on 08/15/12 at [facility]. Spoke with RN at [facility] by phone. Confirmed pt is taking 2.5mg  daily. Faxed orders to continue coumadin 2.5mg  daily and recheck INR in 3 weeks on 08/15/12 at [facility]. I spoke with patient by phone. This is a no charge encounter.  [facility] phone = [phone], Fax: [phone]

## 2012-07-19 NOTE — Patient Instructions (Signed)
Continue 2.5 mg daily.  Recheck INR in 3 weeks on 08/15/12 at [facility].

## 2012-08-08 ENCOUNTER — Encounter: Payer: Self-pay | Admitting: Internal Medicine

## 2012-08-08 ENCOUNTER — Non-Acute Institutional Stay: Payer: Medicare Other | Admitting: Internal Medicine

## 2012-08-08 VITALS — BP 170/82 | HR 62 | Temp 98.0°F | Ht 60.0 in | Wt 112.0 lb

## 2012-08-08 DIAGNOSIS — I82402 Acute embolism and thrombosis of unspecified deep veins of left lower extremity: Secondary | ICD-10-CM

## 2012-08-08 DIAGNOSIS — K2289 Other specified disease of esophagus: Secondary | ICD-10-CM

## 2012-08-08 DIAGNOSIS — I10 Essential (primary) hypertension: Secondary | ICD-10-CM

## 2012-08-08 DIAGNOSIS — M545 Low back pain, unspecified: Secondary | ICD-10-CM

## 2012-08-08 DIAGNOSIS — D473 Essential (hemorrhagic) thrombocythemia: Secondary | ICD-10-CM

## 2012-08-08 DIAGNOSIS — R55 Syncope and collapse: Secondary | ICD-10-CM

## 2012-08-08 DIAGNOSIS — I82409 Acute embolism and thrombosis of unspecified deep veins of unspecified lower extremity: Secondary | ICD-10-CM

## 2012-08-08 DIAGNOSIS — F0390 Unspecified dementia without behavioral disturbance: Secondary | ICD-10-CM

## 2012-08-08 DIAGNOSIS — E7800 Pure hypercholesterolemia, unspecified: Secondary | ICD-10-CM

## 2012-08-08 DIAGNOSIS — R1310 Dysphagia, unspecified: Secondary | ICD-10-CM

## 2012-08-08 DIAGNOSIS — K228 Other specified diseases of esophagus: Secondary | ICD-10-CM

## 2012-08-08 DIAGNOSIS — D126 Benign neoplasm of colon, unspecified: Secondary | ICD-10-CM

## 2012-08-09 ENCOUNTER — Telehealth: Payer: Self-pay | Admitting: *Deleted

## 2012-08-09 NOTE — Telephone Encounter (Signed)
Notified [facility] @ [phone] x [phone] that pt should stay on same dose of hydrea=1 every other day except 2 tabs on mondays.

## 2012-08-15 ENCOUNTER — Ambulatory Visit: Payer: Self-pay | Admitting: Pharmacist

## 2012-08-15 DIAGNOSIS — I82402 Acute embolism and thrombosis of unspecified deep veins of left lower extremity: Secondary | ICD-10-CM

## 2012-08-15 LAB — POCT INR: INR: 2.34

## 2012-08-15 NOTE — Progress Notes (Signed)
Spoke with Daughter in law over phone. INR=2.34 on 2.5 mg daily Continue same dose and will have INR checked in 4 weeks at [facility]. No changes to report.   Orders faxed back to [facility] and results scanned to chart.

## 2012-08-27 LAB — POCT INR: INR: 2.24

## 2012-09-03 ENCOUNTER — Ambulatory Visit: Payer: Self-pay | Admitting: Pharmacist

## 2012-09-03 NOTE — Progress Notes (Signed)
INR on 08/27/12 given to me today in [facility] INR=2.24 on 2.5 mg daily Called Sherri Crawford to inform him of results and let him know dose will remain the same Faxed info to [facility] as well  Recheck INR on 09/30/12 Continue with 2.5 mg daily

## 2012-09-03 NOTE — Patient Instructions (Signed)
Pt will remain on 2.5 mg daily.  Recheck INR with [facility] on 09/30/12.

## 2012-09-04 ENCOUNTER — Telehealth: Payer: Self-pay | Admitting: *Deleted

## 2012-09-04 NOTE — Telephone Encounter (Addendum)
Called [phone] & faxed  [phone] orders to [facility]/[place] to cont same hydrea dose.  Copy of labs sent to be scanned.

## 2012-09-08 ENCOUNTER — Emergency Department (HOSPITAL_COMMUNITY): Payer: Medicare Other

## 2012-09-08 ENCOUNTER — Encounter (HOSPITAL_COMMUNITY): Payer: Self-pay | Admitting: Emergency Medicine

## 2012-09-08 ENCOUNTER — Inpatient Hospital Stay (HOSPITAL_COMMUNITY)
Admission: EM | Admit: 2012-09-08 | Discharge: 2012-09-11 | DRG: 470 | Disposition: A | Discharge status: Skilled Nursing Facility | Payer: Medicare Other | Attending: Internal Medicine | Admitting: Internal Medicine

## 2012-09-08 ENCOUNTER — Inpatient Hospital Stay (HOSPITAL_COMMUNITY): Payer: Medicare Other

## 2012-09-08 DIAGNOSIS — S72009A Fracture of unspecified part of neck of unspecified femur, initial encounter for closed fracture: Principal | ICD-10-CM

## 2012-09-08 DIAGNOSIS — M810 Age-related osteoporosis without current pathological fracture: Secondary | ICD-10-CM

## 2012-09-08 DIAGNOSIS — F32A Depression, unspecified: Secondary | ICD-10-CM

## 2012-09-08 DIAGNOSIS — R55 Syncope and collapse: Secondary | ICD-10-CM

## 2012-09-08 DIAGNOSIS — M549 Dorsalgia, unspecified: Secondary | ICD-10-CM

## 2012-09-08 DIAGNOSIS — Z79899 Other long term (current) drug therapy: Secondary | ICD-10-CM

## 2012-09-08 DIAGNOSIS — M545 Low back pain, unspecified: Secondary | ICD-10-CM | POA: Diagnosis present

## 2012-09-08 DIAGNOSIS — Z9221 Personal history of antineoplastic chemotherapy: Secondary | ICD-10-CM

## 2012-09-08 DIAGNOSIS — R600 Localized edema: Secondary | ICD-10-CM

## 2012-09-08 DIAGNOSIS — D473 Essential (hemorrhagic) thrombocythemia: Secondary | ICD-10-CM

## 2012-09-08 DIAGNOSIS — W19XXXA Unspecified fall, initial encounter: Secondary | ICD-10-CM | POA: Diagnosis present

## 2012-09-08 DIAGNOSIS — J4489 Other specified chronic obstructive pulmonary disease: Secondary | ICD-10-CM

## 2012-09-08 DIAGNOSIS — Z7901 Long term (current) use of anticoagulants: Secondary | ICD-10-CM

## 2012-09-08 DIAGNOSIS — J449 Chronic obstructive pulmonary disease, unspecified: Secondary | ICD-10-CM

## 2012-09-08 DIAGNOSIS — Z860100 Personal history of colon polyps, unspecified: Secondary | ICD-10-CM

## 2012-09-08 DIAGNOSIS — S72001A Fracture of unspecified part of neck of right femur, initial encounter for closed fracture: Secondary | ICD-10-CM

## 2012-09-08 DIAGNOSIS — M1990 Unspecified osteoarthritis, unspecified site: Secondary | ICD-10-CM

## 2012-09-08 DIAGNOSIS — I2510 Atherosclerotic heart disease of native coronary artery without angina pectoris: Secondary | ICD-10-CM | POA: Diagnosis present

## 2012-09-08 DIAGNOSIS — D649 Anemia, unspecified: Secondary | ICD-10-CM | POA: Diagnosis present

## 2012-09-08 DIAGNOSIS — H3530 Unspecified macular degeneration: Secondary | ICD-10-CM

## 2012-09-08 DIAGNOSIS — F341 Dysthymic disorder: Secondary | ICD-10-CM

## 2012-09-08 DIAGNOSIS — F329 Major depressive disorder, single episode, unspecified: Secondary | ICD-10-CM | POA: Diagnosis present

## 2012-09-08 DIAGNOSIS — F411 Generalized anxiety disorder: Secondary | ICD-10-CM | POA: Diagnosis present

## 2012-09-08 DIAGNOSIS — Z8679 Personal history of other diseases of the circulatory system: Secondary | ICD-10-CM

## 2012-09-08 DIAGNOSIS — D126 Benign neoplasm of colon, unspecified: Secondary | ICD-10-CM

## 2012-09-08 DIAGNOSIS — Z86718 Personal history of other venous thrombosis and embolism: Secondary | ICD-10-CM

## 2012-09-08 DIAGNOSIS — L03116 Cellulitis of left lower limb: Secondary | ICD-10-CM

## 2012-09-08 DIAGNOSIS — E7800 Pure hypercholesterolemia, unspecified: Secondary | ICD-10-CM | POA: Diagnosis present

## 2012-09-08 DIAGNOSIS — F419 Anxiety disorder, unspecified: Secondary | ICD-10-CM | POA: Diagnosis present

## 2012-09-08 DIAGNOSIS — Z8673 Personal history of transient ischemic attack (TIA), and cerebral infarction without residual deficits: Secondary | ICD-10-CM

## 2012-09-08 DIAGNOSIS — J209 Acute bronchitis, unspecified: Secondary | ICD-10-CM

## 2012-09-08 DIAGNOSIS — G8929 Other chronic pain: Secondary | ICD-10-CM

## 2012-09-08 DIAGNOSIS — I10 Essential (primary) hypertension: Secondary | ICD-10-CM | POA: Diagnosis present

## 2012-09-08 DIAGNOSIS — D47Z9 Other specified neoplasms of uncertain behavior of lymphoid, hematopoietic and related tissue: Secondary | ICD-10-CM | POA: Diagnosis present

## 2012-09-08 DIAGNOSIS — I82402 Acute embolism and thrombosis of unspecified deep veins of left lower extremity: Secondary | ICD-10-CM

## 2012-09-08 DIAGNOSIS — R1310 Dysphagia, unspecified: Secondary | ICD-10-CM

## 2012-09-08 DIAGNOSIS — F3289 Other specified depressive episodes: Secondary | ICD-10-CM | POA: Diagnosis present

## 2012-09-08 DIAGNOSIS — K219 Gastro-esophageal reflux disease without esophagitis: Secondary | ICD-10-CM

## 2012-09-08 DIAGNOSIS — Z8719 Personal history of other diseases of the digestive system: Secondary | ICD-10-CM

## 2012-09-08 DIAGNOSIS — I82409 Acute embolism and thrombosis of unspecified deep veins of unspecified lower extremity: Secondary | ICD-10-CM

## 2012-09-08 HISTORY — DX: Fracture of unspecified part of neck of unspecified femur, initial encounter for closed fracture: S72.009A

## 2012-09-08 LAB — CBC WITH DIFFERENTIAL/PLATELET
Basophils Absolute: 0 K/uL (ref 0.0–0.1)
Basophils Relative: 0 % (ref 0–1)
Eosinophils Absolute: 0 10*3/uL (ref 0.0–0.7)
Eosinophils Relative: 0 % (ref 0–5)
HCT: 39.4 % (ref 36.0–46.0)
Hemoglobin: 13.1 g/dL (ref 12.0–15.0)
Lymphocytes Relative: 6 % — ABNORMAL LOW (ref 12–46)
Lymphs Abs: 0.6 K/uL — ABNORMAL LOW (ref 0.7–4.0)
MCH: 32.3 pg (ref 26.0–34.0)
MCHC: 33.2 g/dL (ref 30.0–36.0)
MCV: 97.3 fL (ref 78.0–100.0)
Monocytes Absolute: 0.5 10*3/uL (ref 0.1–1.0)
Monocytes Relative: 4 % (ref 3–12)
Neutro Abs: 10 K/uL — ABNORMAL HIGH (ref 1.7–7.7)
Neutrophils Relative %: 90 % — ABNORMAL HIGH (ref 43–77)
Platelets: 240 K/uL (ref 150–400)
RBC: 4.05 MIL/uL (ref 3.87–5.11)
RDW: 15.6 % — ABNORMAL HIGH (ref 11.5–15.5)
WBC: 11.1 K/uL — ABNORMAL HIGH (ref 4.0–10.5)

## 2012-09-08 LAB — BASIC METABOLIC PANEL
BUN: 19 mg/dL (ref 6–23)
Chloride: 99 mEq/L (ref 96–112)
Glucose, Bld: 135 mg/dL — ABNORMAL HIGH (ref 70–99)
Potassium: 3.8 mEq/L (ref 3.5–5.1)

## 2012-09-08 LAB — ABO/RH: ABO/RH(D): A POS

## 2012-09-08 LAB — URINALYSIS, ROUTINE W REFLEX MICROSCOPIC
Bilirubin Urine: NEGATIVE
Glucose, UA: NEGATIVE mg/dL
Ketones, ur: NEGATIVE mg/dL
Leukocytes, UA: NEGATIVE
Nitrite: NEGATIVE
Protein, ur: 100 mg/dL — AB
Specific Gravity, Urine: 1.015 (ref 1.005–1.030)
Urobilinogen, UA: 0.2 mg/dL (ref 0.0–1.0)
pH: 7.5 (ref 5.0–8.0)

## 2012-09-08 LAB — BASIC METABOLIC PANEL WITH GFR
CO2: 27 meq/L (ref 19–32)
Calcium: 9.6 mg/dL (ref 8.4–10.5)
Creatinine, Ser: 0.74 mg/dL (ref 0.50–1.10)
GFR calc Af Amer: 85 mL/min — ABNORMAL LOW (ref >90)
GFR calc non Af Amer: 73 mL/min — ABNORMAL LOW (ref >90)
Sodium: 136 meq/L (ref 135–145)

## 2012-09-08 LAB — PROTIME-INR
INR: 1.66 — ABNORMAL HIGH (ref 0.00–1.49)
Prothrombin Time: 19.1 s — ABNORMAL HIGH (ref 11.6–15.2)

## 2012-09-08 LAB — URINE MICROSCOPIC-ADD ON

## 2012-09-08 LAB — SURGICAL PCR SCREEN: Staphylococcus aureus: NEGATIVE

## 2012-09-08 LAB — TYPE AND SCREEN
ABO/RH(D): A POS
Antibody Screen: NEGATIVE

## 2012-09-08 LAB — APTT: aPTT: 29 seconds (ref 24–37)

## 2012-09-08 MED ORDER — FENTANYL CITRATE 0.05 MG/ML IJ SOLN
50.0000 ug | INTRAMUSCULAR | Status: DC | PRN
  Administered 2012-09-08: 50 ug via INTRAVENOUS
  Filled 2012-09-08: qty 2

## 2012-09-08 MED ORDER — ATORVASTATIN CALCIUM 20 MG PO TABS
20.0000 mg | ORAL_TABLET | Freq: Every evening | ORAL | Status: DC
  Administered 2012-09-08 – 2012-09-10 (×2): 20 mg via ORAL
  Filled 2012-09-08 (×4): qty 1

## 2012-09-08 MED ORDER — MOMETASONE FURO-FORMOTEROL FUM 100-5 MCG/ACT IN AERO
2.0000 | INHALATION_SPRAY | Freq: Two times a day (BID) | RESPIRATORY_TRACT | Status: DC
  Administered 2012-09-08 – 2012-09-11 (×6): 2 via RESPIRATORY_TRACT
  Filled 2012-09-08: qty 8.8

## 2012-09-08 MED ORDER — PANTOPRAZOLE SODIUM 40 MG PO TBEC
40.0000 mg | DELAYED_RELEASE_TABLET | Freq: Every day | ORAL | Status: DC
  Administered 2012-09-08 – 2012-09-11 (×3): 40 mg via ORAL
  Filled 2012-09-08 (×4): qty 1

## 2012-09-08 MED ORDER — NITROGLYCERIN 0.4 MG SL SUBL: 0.4000 mg | SUBLINGUAL_TABLET | SUBLINGUAL | Status: DC | PRN

## 2012-09-08 MED ORDER — VITAMIN D3 25 MCG (1000 UNIT) PO TABS
1000.0000 [IU] | ORAL_TABLET | Freq: Every morning | ORAL | Status: DC
  Administered 2012-09-08 – 2012-09-11 (×3): 1000 [IU] via ORAL
  Filled 2012-09-08 (×4): qty 1

## 2012-09-08 MED ORDER — HYDROXYUREA 500 MG PO CAPS
500.0000 mg | ORAL_CAPSULE | ORAL | Status: DC
  Administered 2012-09-08 – 2012-09-11 (×2): 500 mg via ORAL
  Filled 2012-09-08 (×2): qty 1

## 2012-09-08 MED ORDER — BOOST / RESOURCE BREEZE PO LIQD
1.0000 | Freq: Three times a day (TID) | ORAL | Status: DC
  Administered 2012-09-08 – 2012-09-10 (×3): 1 via ORAL

## 2012-09-08 MED ORDER — MEMANTINE HCL 5 MG PO TABS
5.0000 mg | ORAL_TABLET | Freq: Two times a day (BID) | ORAL | Status: DC
  Administered 2012-09-08 – 2012-09-11 (×6): 5 mg via ORAL
  Filled 2012-09-08 (×8): qty 1

## 2012-09-08 MED ORDER — ALBUTEROL SULFATE HFA 108 (90 BASE) MCG/ACT IN AERS: 2.0000 | INHALATION_SPRAY | Freq: Four times a day (QID) | RESPIRATORY_TRACT | Status: DC | PRN

## 2012-09-08 MED ORDER — MONTELUKAST SODIUM 10 MG PO TABS
10.0000 mg | ORAL_TABLET | Freq: Every day | ORAL | Status: DC
  Administered 2012-09-08 – 2012-09-11 (×3): 10 mg via ORAL
  Filled 2012-09-08 (×4): qty 1

## 2012-09-08 MED ORDER — EPINEPHRINE 0.3 MG/0.3ML IJ SOAJ
0.3000 mg | Freq: Once | INTRAMUSCULAR | Status: AC | PRN
  Filled 2012-09-08: qty 0.6

## 2012-09-08 MED ORDER — KCL IN DEXTROSE-NACL 20-5-0.45 MEQ/L-%-% IV SOLN
INTRAVENOUS | Status: DC
  Administered 2012-09-08 – 2012-09-10 (×3): via INTRAVENOUS
  Filled 2012-09-08 (×5): qty 1000

## 2012-09-08 MED ORDER — HYDROCODONE-ACETAMINOPHEN 5-325 MG PO TABS
1.0000 | ORAL_TABLET | Freq: Four times a day (QID) | ORAL | Status: DC | PRN
  Administered 2012-09-08 – 2012-09-10 (×7): 2 via ORAL
  Administered 2012-09-11 (×4): 1 via ORAL
  Filled 2012-09-08 (×2): qty 2
  Filled 2012-09-08 (×2): qty 1
  Filled 2012-09-08: qty 2
  Filled 2012-09-08 (×3): qty 1
  Filled 2012-09-08 (×4): qty 2

## 2012-09-08 MED ORDER — HYDROXYUREA 500 MG PO CAPS
1000.0000 mg | ORAL_CAPSULE | ORAL | Status: DC
  Administered 2012-09-09: 1000 mg via ORAL
  Filled 2012-09-08: qty 2

## 2012-09-08 MED ORDER — SERTRALINE HCL 25 MG PO TABS
25.0000 mg | ORAL_TABLET | Freq: Every evening | ORAL | Status: DC
  Administered 2012-09-08 – 2012-09-10 (×2): 25 mg via ORAL
  Filled 2012-09-08 (×4): qty 1

## 2012-09-08 MED ORDER — TRAMADOL HCL 50 MG PO TABS
50.0000 mg | ORAL_TABLET | Freq: Once | ORAL | Status: AC
  Administered 2012-09-08: 50 mg via ORAL
  Filled 2012-09-08: qty 1

## 2012-09-08 MED ORDER — SODIUM CHLORIDE 0.9 % IV SOLN
1000.0000 mL | INTRAVENOUS | Status: DC
  Administered 2012-09-08: 1000 mL via INTRAVENOUS

## 2012-09-08 MED ORDER — MEMANTINE HCL ER 14 MG PO CP24: 14.0000 mg | ORAL_CAPSULE | Freq: Every morning | ORAL | Status: DC

## 2012-09-08 MED ORDER — ENSURE ENLIVE PO LIQD: 1.0000 | Freq: Three times a day (TID) | ORAL | Status: DC

## 2012-09-08 MED ORDER — ASPIRIN EC 81 MG PO TBEC
81.0000 mg | DELAYED_RELEASE_TABLET | Freq: Every morning | ORAL | Status: DC
  Administered 2012-09-08 – 2012-09-11 (×3): 81 mg via ORAL
  Filled 2012-09-08 (×4): qty 1

## 2012-09-08 MED ORDER — MORPHINE SULFATE 2 MG/ML IJ SOLN
0.5000 mg | INTRAMUSCULAR | Status: DC | PRN
  Administered 2012-09-08 – 2012-09-10 (×7): 0.5 mg via INTRAVENOUS
  Filled 2012-09-08 (×7): qty 1

## 2012-09-08 NOTE — ED Notes (Addendum)
Pt arrived from [facility] at [place] via EMS to the ED with a complaint of a fall. Pt got up thios AM to go to the bathroom when she slipped and fell on a tile floor.  Pt complained of lower back pain.  Pt normally on coumadin but last dose was withheld.  Pt given 4mg  of zofran by EMS in route

## 2012-09-08 NOTE — ED Notes (Signed)
Pt normally on coumadin but according to the hospital pt's dose stopped on 09/07/2012 so she could get an injection.

## 2012-09-08 NOTE — ED Provider Notes (Signed)
History    CSN: [id number] Arrival date & time 09/08/12  7829  First MD Initiated Contact with Patient 09/08/12 [phone]     Chief Complaint  Patient presents with  . Fall  . Back Pain   (Consider location/radiation/quality/duration/timing/severity/associated sxs/prior Treatment) HPI Comments: Pt had gotten up to go to bathroom, was trying to put on slippers and slipped on vinyl floor and fell mostly on right hip, landed on back and hit back of head.  Pt was on coumadin, had stopped a few days ago in preparation for a back epidural.  No CP, SOB, no LOC.  No neck pain.  Low back pain is not significantly worse than usual.  Patient is a 79 y.o. female presenting with fall, back pain, and hip pain. The history is provided by the patient and a relative.  Fall This is a new problem. Pertinent negatives include no chest pain, no abdominal pain, no headaches and no shortness of breath.  Back Pain Location:  Lumbar spine Quality:  Stiffness and aching Stiffness is present:  All day Timing:  Constant Progression:  Waxing and waning Associated symptoms: no abdominal pain, no chest pain and no headaches   Hip Pain This is a new problem. The current episode started 1 to 2 hours ago. The problem occurs constantly. The problem has not changed since onset.Pertinent negatives include no chest pain, no abdominal pain, no headaches and no shortness of breath. The symptoms are aggravated by bending and standing. The symptoms are relieved by relaxation. She has tried nothing for the symptoms.   Past Medical History  Diagnosis Date  . Macular degeneration   . COPD (chronic obstructive pulmonary disease)   . Hypertension   . Coronary artery disease   . Hypercholesterolemia   . GERD (gastroesophageal reflux disease)   . Asthmatic bronchitis   . History of colonic polyps   . DJD (degenerative joint disease)   . Lumbar back pain   . Osteoporosis   . History of transient ischemic attack (TIA)   .  Anxiety   . Essential thrombocythemia   . Diverticulosis 05/10/2012  . Osteoarthrosis, unspecified whether generalized or localized, unspecified site 05/09/2012  . Memory loss 05/09/2012  . Abnormal weight gain 05/09/2012  . Phlebitis and thrombophlebitis of other deep vessels of lower extremities 07/10/2010  . Essential thrombocythemia 06/09/2009   Past Surgical History  Procedure Laterality Date  . Appendectomy    . Cholecystectomy    . Tah and bso  08/21/1995  . Cataract extraction w/ intraocular lens  implant, bilateral    . Abdominal hysterectomy     Family History  Problem Relation Age of Onset  . Colon cancer Son 78  . Crohn's disease Son   . Cancer Maternal Aunt    History  Substance Use Topics  . Smoking status: Never Smoker   . Smokeless tobacco: Never Used  . Alcohol Use: No   OB History   Grav Para Term Preterm Abortions TAB SAB Ect Mult Living                 Review of Systems  HENT: Negative for neck pain.   Respiratory: Negative for shortness of breath.   Cardiovascular: Negative for chest pain.  Gastrointestinal: Negative for nausea and abdominal pain.  Musculoskeletal: Positive for back pain and arthralgias.  Skin: Negative for wound.  Neurological: Negative for headaches.  All other systems reviewed and are negative.    Allergies  Sulfonamide derivatives  Home Medications  Current Outpatient Rx  Name  Route  Sig  Dispense  Refill  . aspirin EC 81 MG tablet   Oral   Take 81 mg by mouth every morning.         Marland Kitchen atorvastatin (LIPITOR) 20 MG tablet   Oral   Take 20 mg by mouth every evening.          . cholecalciferol (VITAMIN D) 1000 UNITS tablet   Oral   Take 1,000 Units by mouth every morning.         . Fluticasone-Salmeterol (ADVAIR) 250-50 MCG/DOSE AEPB   Inhalation   Inhale 1 puff into the lungs every 12 (twelve) hours.         Marland Kitchen HYDROcodone-acetaminophen (NORCO) 10-325 MG per tablet   Oral   Take 1 tablet by mouth every 6  (six) hours as needed for pain. Take one tablet four times daily for pain         . hydroxyurea (HYDREA) 500 MG capsule   Oral   Take 500 mg by mouth See admin instructions. Take 2 capsules on Monday. Take 1 capsule every other day of the week (Wed, Fri, Sun)         . Memantine HCl ER (NAMENDA XR) 14 MG CP24   Oral   Take 14 mg by mouth every morning. Take one tablet daily for memory         . metoprolol tartrate (LOPRESSOR) 25 MG tablet   Oral   Take 25 mg by mouth 2 (two) times daily.         . montelukast (SINGULAIR) 10 MG tablet   Oral   Take 10 mg by mouth at bedtime.         . Multiple Vitamin (MULTIVITAMIN PO)   Oral   Take 1 tablet by mouth every morning.          . Multiple Vitamins-Minerals (MACULAR VITAMIN BENEFIT PO)   Oral   Take 1 tablet by mouth every morning.          . Nutritional Supplements (ENSURE ENLIVE) LIQD   Oral   Take 1 Can by mouth 3 (three) times daily. Three times daily         . omeprazole (PRILOSEC) 20 MG capsule   Oral   Take 20 mg by mouth every morning.          . sertraline (ZOLOFT) 25 MG tablet   Oral   Take 25 mg by mouth every evening.         . warfarin (COUMADIN) 2.5 MG tablet   Oral   Take 2.5 mg by mouth every evening.         Marland Kitchen albuterol (PROAIR HFA) 108 (90 BASE) MCG/ACT inhaler   Inhalation   Inhale 2 puffs into the lungs every 6 (six) hours as needed for wheezing. As needed for wheezing   1 Inhaler   11   . EPIPEN 2-PAK 0.3 MG/0.3ML DEVI   Subcutaneous   Inject 0.3 mg into the skin once as needed (allergic reaction).          . nitroGLYCERIN (NITROSTAT) 0.4 MG SL tablet   Sublingual   Place 0.4 mg under the tongue every 5 (five) minutes as needed for chest pain.           BP 109/50  Pulse 64  Temp(Src) 97.9 F (36.6 C) (Oral)  Resp 20  SpO2 95% Physical Exam  Nursing note and vitals reviewed. Constitutional: She  appears well-developed and well-nourished. She is cooperative.   Non-toxic appearance. No distress.  HENT:  Head: Normocephalic and atraumatic.  Mouth/Throat: Mucous membranes are dry.  Eyes: EOM are normal.  Neck: Normal range of motion. Neck supple. No spinous process tenderness present.  Cardiovascular: Normal rate, regular rhythm and intact distal pulses.   No murmur heard. Pulmonary/Chest: Effort normal. No respiratory distress.  Abdominal: Soft. She exhibits no distension. There is no rebound.  Musculoskeletal:       Right hip: She exhibits decreased range of motion and tenderness. She exhibits no crepitus, no deformity and no laceration.       Cervical back: She exhibits normal range of motion and no tenderness.       Lumbar back: She exhibits pain. She exhibits no tenderness, no bony tenderness and no deformity.  Neurological: She is alert. She exhibits normal muscle tone.  Skin: Skin is warm and dry. No rash noted. She is not diaphoretic.  Psychiatric: She has a normal mood and affect.    ED Course  Procedures (including critical care time) Labs Reviewed  BASIC METABOLIC PANEL  CBC WITH DIFFERENTIAL  PROTIME-INR  URINALYSIS, ROUTINE W REFLEX MICROSCOPIC  TYPE AND SCREEN   Dg Lumbar Spine Complete  09/08/2012   *RADIOLOGY REPORT*  Clinical Data: Fall with low back pain.  LUMBAR SPINE - COMPLETE 4+ VIEW  Comparison: None.  Findings: Diffuse spondylosis of the lumbar spine noted.  There is no evidence of acute fracture.  There is mild grade 1 retrolisthesis of L1 on L2.  The aorta and iliac arteries are heavily calcified.  IMPRESSION: Diffuse lumbar spondylosis.  No evidence of fracture.   Original Report Authenticated By: Irish Lack, M.D.   Dg Hip Complete Right  09/08/2012   *RADIOLOGY REPORT*  Clinical Data: Fall with right hip pain.  RIGHT HIP - COMPLETE 2+ VIEW  Comparison: None.  Findings: There is evidence of a displaced subcapital hip fracture on the right.  No dislocation is identified.  The bony pelvis is intact.  IMPRESSION:  Displaced right subcapital hip fracture.   Original Report Authenticated By: Irish Lack, M.D.   Ct Head Wo Contrast  09/08/2012   *RADIOLOGY REPORT*  Clinical Data: Post fall.  On Coumadin.  CT HEAD WITHOUT CONTRAST  Technique:  Contiguous axial images were obtained from the base of the skull through the vertex without contrast.  Comparison: 07/19/2009.  Findings: No skull fracture or intracranial hemorrhage.  Small vessel disease type changes without CT evidence of large acute infarct.  No hydrocephalus.  Age related atrophy.  No intracranial mass lesion detected on this unenhanced exam.  Vascular calcifications.  Mastoid air cells, middle ear cavities and visualized sinuses are clear.  Orbital structures unremarkable.  IMPRESSION: No skull fracture or intracranial hemorrhage.  Small vessel disease type changes without CT evidence of large acute infarct.   Original Report Authenticated By: Lacy Duverney, M.D.   1. Hip fracture requiring operative repair, right, closed, initial encounter   2. Chronic back pain    ECG at time 1019 shows SR at rate 73, normal axis, likely LVH with repol abn.  No longer see RBBB seen on 07/02/10.  8:53 AM Plain films shows subcapital fracture of right hip.  Will need eval by ortho, medical admit and clearance.  Pt was on coumadin, may need reversal.  Pain control, keep NPO.     9:42 AM Pt is comfortable . Family notified. Pt's back surgeon is Dr. Ethelene Hal with [facility], will  consult with their group to see pt.  Her epidural procedure was scheduled for Thursday.    10:37 AM Spoke to Dr. Charlann Boxer who will see pt. MDM  Pt s/p mechanical fall.  Pt with right hip pain and mild tenderness, low back pain seems to be at baseline.  No HA, but is on coumadin.  No LOC, denies any neck pain, I don't think hip pain or back pain is distractible injury.  Does not need c spine imaging.  No numbness or weakness of limbs.    Gavin Pound. [place], MD 09/08/12 1037

## 2012-09-08 NOTE — ED Notes (Signed)
Pt has small bruise on lower back

## 2012-09-08 NOTE — Progress Notes (Signed)
Call to ED report on pt taken from [place]; ask that she confirm code status.

## 2012-09-08 NOTE — Progress Notes (Signed)
Spoke with [employer] PA, plan is for ortho to assess pt in the am. Will make family (at bedside) aware.

## 2012-09-08 NOTE — ED Notes (Signed)
Bed:WA17<BR> Expected date:<BR> Expected time:<BR> Means of arrival:<BR> Comments:<BR> EMS

## 2012-09-08 NOTE — H&P (Signed)
[facility] History and Physical  Sherri Crawford:[id number] DOB: 27-May-1920 DOA: 09/08/2012  Referring physician: Dr. Oletta Lamas PCP: Michele Mcalpine, MD  Specialists: Ortho consulted by ED. Reportedly spoke with Dr. Charlann Boxer with [facility] ortho  Chief Complaint: fall with resultant right hip pain  HPI: Sherri Crawford is a 79 y.o. female  With h/o copd, htn, h/o dvt on coumadin who presented to the ED after a mechanical fall on right side.  This occurred early this AM. Patient has had pain at right hip which is worse with weight bearing and with palpation.    In ED patient had head ct which was negative.  Xray of right hip reported as displaced right subcapital hip fx. We were contacted for admission recommendations for routine hip fx pathway. Ortho to manage.  Review of Systems: 10 point review of systems reviewed and negative unless otherwise mentioned above.  Past Medical History  Diagnosis Date  . Macular degeneration   . COPD (chronic obstructive pulmonary disease)   . Hypertension   . Coronary artery disease   . Hypercholesterolemia   . GERD (gastroesophageal reflux disease)   . Asthmatic bronchitis   . History of colonic polyps   . DJD (degenerative joint disease)   . Lumbar back pain   . Osteoporosis   . History of transient ischemic attack (TIA)   . Anxiety   . Essential thrombocythemia   . Diverticulosis 05/10/2012  . Osteoarthrosis, unspecified whether generalized or localized, unspecified site 05/09/2012  . Memory loss 05/09/2012  . Abnormal weight gain 05/09/2012  . Phlebitis and thrombophlebitis of other deep vessels of lower extremities 07/10/2010  . Essential thrombocythemia 06/09/2009   Past Surgical History  Procedure Laterality Date  . Appendectomy    . Cholecystectomy    . Tah and bso  08/21/1995  . Cataract extraction w/ intraocular lens  implant, bilateral    . Abdominal hysterectomy     Social History:  reports that she has never smoked. She has never  used smokeless tobacco. She reports that she does not drink alcohol or use illicit drugs.   Can patient participate in ADLs? limited  Allergies  Allergen Reactions  . Sulfonamide Derivatives Other (See Comments)    Pt is unsure of reaction    Family History  Problem Relation Age of Onset  . Colon cancer Son 57  . Crohn's disease Son   . Cancer Maternal Aunt   none other reported  Prior to Admission medications   Medication Sig Start Date End Date Taking? Authorizing Provider  aspirin EC 81 MG tablet Take 81 mg by mouth every morning.   Yes Historical Provider, MD  atorvastatin (LIPITOR) 20 MG tablet Take 20 mg by mouth every evening.  10/25/11  Yes Michele Mcalpine, MD  cholecalciferol (VITAMIN D) 1000 UNITS tablet Take 1,000 Units by mouth every morning.   Yes Historical Provider, MD  Fluticasone-Salmeterol (ADVAIR) 250-50 MCG/DOSE AEPB Inhale 1 puff into the lungs every 12 (twelve) hours.   Yes Historical Provider, MD  HYDROcodone-acetaminophen (NORCO) 10-325 MG per tablet Take 1 tablet by mouth every 6 (six) hours as needed for pain. Take one tablet four times daily for pain   Yes Historical Provider, MD  hydroxyurea (HYDREA) 500 MG capsule Take 500 mg by mouth See admin instructions. Take 2 capsules on Monday. Take 1 capsule every other day of the week (Wed, Fri, Sun) 04/27/11  Yes Levert Feinstein, MD  Memantine HCl ER (NAMENDA XR) 14 MG CP24 Take  14 mg by mouth every morning. Take one tablet daily for memory   Yes Historical Provider, MD  metoprolol tartrate (LOPRESSOR) 25 MG tablet Take 25 mg by mouth 2 (two) times daily.   Yes Historical Provider, MD  montelukast (SINGULAIR) 10 MG tablet Take 10 mg by mouth at bedtime.   Yes Historical Provider, MD  Multiple Vitamin (MULTIVITAMIN PO) Take 1 tablet by mouth every morning.    Yes Historical Provider, MD  Multiple Vitamins-Minerals (MACULAR VITAMIN BENEFIT PO) Take 1 tablet by mouth every morning.    Yes Historical Provider, MD   Nutritional Supplements (ENSURE ENLIVE) LIQD Take 1 Can by mouth 3 (three) times daily. Three times daily   Yes Historical Provider, MD  omeprazole (PRILOSEC) 20 MG capsule Take 20 mg by mouth every morning.  05/15/12  Yes Historical Provider, MD  sertraline (ZOLOFT) 25 MG tablet Take 25 mg by mouth every evening.   Yes Historical Provider, MD  warfarin (COUMADIN) 2.5 MG tablet Take 2.5 mg by mouth every evening.   Yes Historical Provider, MD  albuterol (PROAIR HFA) 108 (90 BASE) MCG/ACT inhaler Inhale 2 puffs into the lungs every 6 (six) hours as needed for wheezing. As needed for wheezing 11/16/10   Michele Mcalpine, MD  EPIPEN 2-PAK 0.3 MG/0.3ML DEVI Inject 0.3 mg into the skin once as needed (allergic reaction).  10/18/10   Historical Provider, MD  nitroGLYCERIN (NITROSTAT) 0.4 MG SL tablet Place 0.4 mg under the tongue every 5 (five) minutes as needed for chest pain.     Historical Provider, MD   Physical Exam: Filed Vitals:   09/08/12 0625 09/08/12 1152  BP: 109/50 138/61  Pulse: 64 74  Temp: 97.9 F (36.6 C) 98.4 F (36.9 C)  TempSrc: Oral Oral  Resp: 20 18  SpO2: 95% 96%     General:  Pt in NAD, Alert and Awake  Eyes: pin point pupils, reactive, EOMI  ENT: no masses on visual examination, MMM  Neck: supple, no goiter  Cardiovascular: RRR, no MRG  Respiratory: CTA BL, no wheezes  Abdomen: soft, NT, ND  Skin: warm and dry  Musculoskeletal: no cyanosis or clubbing. Pain with palpation over right hip  Psychiatric: mood and affect appropriate  Neurologic: no facial asymmetry, responds to questions appropriately.  Labs on Admission:  Basic Metabolic Panel:  Recent Labs Lab 09/08/12 0925  NA 136  K 3.8  CL 99  CO2 27  GLUCOSE 135*  BUN 19  CREATININE 0.74  CALCIUM 9.6   Liver Function Tests: No results found for this basename: AST, ALT, ALKPHOS, BILITOT, PROT, ALBUMIN,  in the last 168 hours No results found for this basename: LIPASE, AMYLASE,  in the last  168 hours No results found for this basename: AMMONIA,  in the last 168 hours CBC:  Recent Labs Lab 09/08/12 0925  WBC 11.1*  NEUTROABS 10.0*  HGB 13.1  HCT 39.4  MCV 97.3  PLT 240   Cardiac Enzymes: No results found for this basename: CKTOTAL, CKMB, CKMBINDEX, TROPONINI,  in the last 168 hours  BNP (last 3 results) No results found for this basename: PROBNP,  in the last 8760 hours CBG: No results found for this basename: GLUCAP,  in the last 168 hours  Radiological Exams on Admission: Dg Lumbar Spine Complete  09/08/2012   *RADIOLOGY REPORT*  Clinical Data: Fall with low back pain.  LUMBAR SPINE - COMPLETE 4+ VIEW  Comparison: None.  Findings: Diffuse spondylosis of the lumbar spine noted.  There is no evidence of acute fracture.  There is mild grade 1 retrolisthesis of L1 on L2.  The aorta and iliac arteries are heavily calcified.  IMPRESSION: Diffuse lumbar spondylosis.  No evidence of fracture.   Original Report Authenticated By: Irish Lack, M.D.   Dg Hip Complete Right  09/08/2012   *RADIOLOGY REPORT*  Clinical Data: Fall with right hip pain.  RIGHT HIP - COMPLETE 2+ VIEW  Comparison: None.  Findings: There is evidence of a displaced subcapital hip fracture on the right.  No dislocation is identified.  The bony pelvis is intact.  IMPRESSION: Displaced right subcapital hip fracture.   Original Report Authenticated By: Irish Lack, M.D.   Ct Head Wo Contrast  09/08/2012   *RADIOLOGY REPORT*  Clinical Data: Post fall.  On Coumadin.  CT HEAD WITHOUT CONTRAST  Technique:  Contiguous axial images were obtained from the base of the skull through the vertex without contrast.  Comparison: 07/19/2009.  Findings: No skull fracture or intracranial hemorrhage.  Small vessel disease type changes without CT evidence of large acute infarct.  No hydrocephalus.  Age related atrophy.  No intracranial mass lesion detected on this unenhanced exam.  Vascular calcifications.  Mastoid air cells,  middle ear cavities and visualized sinuses are clear.  Orbital structures unremarkable.  IMPRESSION: No skull fracture or intracranial hemorrhage.  Small vessel disease type changes without CT evidence of large acute infarct.   Original Report Authenticated By: Lacy Duverney, M.D.    EKG: Independently reviewed. Sinus Rhythm, T wave inversion at T 4 and T 5 which is new when compared to prior EKG's. No ST elevation or depressions but non specific T wave pattern  Assessment/Plan Active Problems:   Essential hypertension   COPD with asthma   BACK PAIN, LUMBAR   DVT (deep venous thrombosis)   Anxiety and depression   Hip fracture   1. Hip fracture - Will hold coumadin given plans for procedure, defer recommendations on when to start coumadin to ortho based on their experience and plans - place NPO with MIVF until definitive plans for operation are made. - Hip fracture order set protocol complete see orders for details  2. COPD with asthma - stable, continue home regimen  3. H/o DVT - coumadin will be on hold. Patient can't remember why or when she had blood clot and son at bedside can not provide further information. - defer to ortho when to restart coumadin  4. HTN - will hold blood pressure medication at this juncture   Code Status: full Family Communication:  Discussed with patient and son at bedside.  Disposition Plan: Pending further plans by ortho  Time spent: > 35 minutes  Penny Pia [facility] Pager [phone]  If 7PM-7AM, please contact night-coverage [email or web address] Password [email or web address] 09/08/2012, 12:14 PM

## 2012-09-08 NOTE — Progress Notes (Signed)
Pt in 1422, transferred to bed. Son present. Pain tolerable, no other complaints.

## 2012-09-09 ENCOUNTER — Inpatient Hospital Stay (HOSPITAL_COMMUNITY): Payer: Medicare Other

## 2012-09-09 ENCOUNTER — Encounter (HOSPITAL_COMMUNITY): Payer: Self-pay | Admitting: Anesthesiology

## 2012-09-09 ENCOUNTER — Encounter (HOSPITAL_COMMUNITY)
Admission: EM | Disposition: A | Discharge status: Skilled Nursing Facility | Payer: Self-pay | Source: Home / Self Care | Attending: Internal Medicine

## 2012-09-09 ENCOUNTER — Inpatient Hospital Stay (HOSPITAL_COMMUNITY): Payer: Medicare Other | Admitting: Anesthesiology

## 2012-09-09 DIAGNOSIS — I2510 Atherosclerotic heart disease of native coronary artery without angina pectoris: Secondary | ICD-10-CM

## 2012-09-09 HISTORY — PX: HIP ARTHROPLASTY: SHX981

## 2012-09-09 LAB — BASIC METABOLIC PANEL
CO2: 25 mEq/L (ref 19–32)
Glucose, Bld: 154 mg/dL — ABNORMAL HIGH (ref 70–99)
Potassium: 3.9 mEq/L (ref 3.5–5.1)
Sodium: 136 mEq/L (ref 135–145)

## 2012-09-09 LAB — CBC
Hemoglobin: 10.5 g/dL — ABNORMAL LOW (ref 12.0–15.0)
MCH: 31.4 pg (ref 26.0–34.0)
RBC: 3.34 MIL/uL — ABNORMAL LOW (ref 3.87–5.11)

## 2012-09-09 LAB — PROTIME-INR: Prothrombin Time: 20.6 seconds — ABNORMAL HIGH (ref 11.6–15.2)

## 2012-09-09 SURGERY — HEMIARTHROPLASTY, HIP, DIRECT ANTERIOR APPROACH, FOR FRACTURE
Anesthesia: Regional | Site: Hip | Laterality: Right | Wound class: Clean

## 2012-09-09 MED ORDER — FENTANYL CITRATE 0.05 MG/ML IJ SOLN
INTRAMUSCULAR | Status: AC
  Filled 2012-09-09: qty 2

## 2012-09-09 MED ORDER — METOPROLOL TARTRATE 25 MG PO TABS
25.0000 mg | ORAL_TABLET | Freq: Two times a day (BID) | ORAL | Status: DC
  Administered 2012-09-09 – 2012-09-11 (×5): 25 mg via ORAL
  Filled 2012-09-09 (×6): qty 1

## 2012-09-09 MED ORDER — NEOSTIGMINE METHYLSULFATE 1 MG/ML IJ SOLN
INTRAMUSCULAR | Status: DC | PRN
  Administered 2012-09-09: 2.5 mg via INTRAVENOUS

## 2012-09-09 MED ORDER — OXYCODONE HCL 5 MG/5ML PO SOLN
5.0000 mg | Freq: Once | ORAL | Status: DC | PRN
  Filled 2012-09-09: qty 5

## 2012-09-09 MED ORDER — DOCUSATE SODIUM 100 MG PO CAPS
100.0000 mg | ORAL_CAPSULE | Freq: Two times a day (BID) | ORAL | Status: DC
  Administered 2012-09-09 – 2012-09-11 (×4): 100 mg via ORAL
  Filled 2012-09-09 (×5): qty 1

## 2012-09-09 MED ORDER — CEFAZOLIN SODIUM-DEXTROSE 2-3 GM-% IV SOLR
INTRAVENOUS | Status: AC
  Filled 2012-09-09: qty 50

## 2012-09-09 MED ORDER — MENTHOL 3 MG MT LOZG: 1.0000 | LOZENGE | OROMUCOSAL | Status: DC | PRN

## 2012-09-09 MED ORDER — LACTATED RINGERS IV SOLN: INTRAVENOUS | Status: DC

## 2012-09-09 MED ORDER — WARFARIN - PHARMACIST DOSING INPATIENT: Freq: Every day | Status: DC

## 2012-09-09 MED ORDER — 0.9 % SODIUM CHLORIDE (POUR BTL) OPTIME
TOPICAL | Status: DC | PRN
  Administered 2012-09-09: 1000 mL

## 2012-09-09 MED ORDER — MEPERIDINE HCL 50 MG/ML IJ SOLN: 6.2500 mg | INTRAMUSCULAR | Status: DC | PRN

## 2012-09-09 MED ORDER — FERROUS SULFATE 325 (65 FE) MG PO TABS
325.0000 mg | ORAL_TABLET | Freq: Three times a day (TID) | ORAL | Status: DC
  Administered 2012-09-10 – 2012-09-11 (×5): 325 mg via ORAL
  Filled 2012-09-09 (×7): qty 1

## 2012-09-09 MED ORDER — ACETAMINOPHEN 10 MG/ML IV SOLN
1000.0000 mg | Freq: Once | INTRAVENOUS | Status: DC | PRN
  Filled 2012-09-09: qty 100

## 2012-09-09 MED ORDER — METOCLOPRAMIDE HCL 5 MG PO TABS
5.0000 mg | ORAL_TABLET | Freq: Three times a day (TID) | ORAL | Status: DC | PRN
  Filled 2012-09-09: qty 2

## 2012-09-09 MED ORDER — LACTATED RINGERS IV SOLN
INTRAVENOUS | Status: DC | PRN
  Administered 2012-09-09 (×2): via INTRAVENOUS

## 2012-09-09 MED ORDER — ROPIVACAINE HCL 5 MG/ML IJ SOLN
INTRAMUSCULAR | Status: DC | PRN
  Administered 2012-09-09: 30 mL via EPIDURAL

## 2012-09-09 MED ORDER — SUCCINYLCHOLINE CHLORIDE 20 MG/ML IJ SOLN
INTRAMUSCULAR | Status: DC | PRN
  Administered 2012-09-09: 60 mg via INTRAVENOUS

## 2012-09-09 MED ORDER — PROPOFOL 10 MG/ML IV BOLUS
INTRAVENOUS | Status: DC | PRN
  Administered 2012-09-09: 80 mg via INTRAVENOUS

## 2012-09-09 MED ORDER — ROPIVACAINE HCL 5 MG/ML IJ SOLN
INTRAMUSCULAR | Status: AC
  Filled 2012-09-09: qty 30

## 2012-09-09 MED ORDER — SODIUM CHLORIDE 0.9 % IV SOLN: 1000.0000 mL | INTRAVENOUS | Status: DC

## 2012-09-09 MED ORDER — METOCLOPRAMIDE HCL 5 MG/ML IJ SOLN: 5.0000 mg | Freq: Three times a day (TID) | INTRAMUSCULAR | Status: DC | PRN

## 2012-09-09 MED ORDER — OXYCODONE HCL 5 MG PO TABS: 5.0000 mg | ORAL_TABLET | Freq: Once | ORAL | Status: DC | PRN

## 2012-09-09 MED ORDER — LIDOCAINE HCL (CARDIAC) 20 MG/ML IV SOLN
INTRAVENOUS | Status: DC | PRN
  Administered 2012-09-09: 50 mg via INTRAVENOUS

## 2012-09-09 MED ORDER — GLYCOPYRROLATE 0.2 MG/ML IJ SOLN
INTRAMUSCULAR | Status: DC | PRN
  Administered 2012-09-09: .4 mg via INTRAVENOUS

## 2012-09-09 MED ORDER — WARFARIN SODIUM 2.5 MG PO TABS
2.5000 mg | ORAL_TABLET | Freq: Once | ORAL | Status: AC
  Administered 2012-09-09: 2.5 mg via ORAL
  Filled 2012-09-09: qty 1

## 2012-09-09 MED ORDER — HYDROMORPHONE HCL PF 1 MG/ML IJ SOLN: 0.2500 mg | INTRAMUSCULAR | Status: DC | PRN

## 2012-09-09 MED ORDER — CISATRACURIUM BESYLATE (PF) 10 MG/5ML IV SOLN
INTRAVENOUS | Status: DC | PRN
  Administered 2012-09-09: 3 mg via INTRAVENOUS

## 2012-09-09 MED ORDER — FENTANYL CITRATE 0.05 MG/ML IJ SOLN
INTRAMUSCULAR | Status: DC | PRN
  Administered 2012-09-09 (×2): 50 ug via INTRAVENOUS

## 2012-09-09 MED ORDER — ONDANSETRON HCL 4 MG/2ML IJ SOLN: 4.0000 mg | Freq: Four times a day (QID) | INTRAMUSCULAR | Status: DC | PRN

## 2012-09-09 MED ORDER — PHENOL 1.4 % MT LIQD: 1.0000 | OROMUCOSAL | Status: DC | PRN

## 2012-09-09 MED ORDER — PROMETHAZINE HCL 25 MG/ML IJ SOLN: 6.2500 mg | INTRAMUSCULAR | Status: DC | PRN

## 2012-09-09 MED ORDER — ONDANSETRON HCL 4 MG PO TABS
4.0000 mg | ORAL_TABLET | Freq: Four times a day (QID) | ORAL | Status: DC | PRN
  Administered 2012-09-11: 4 mg via ORAL
  Filled 2012-09-09 (×2): qty 1

## 2012-09-09 MED ORDER — CEFAZOLIN SODIUM-DEXTROSE 2-3 GM-% IV SOLR
INTRAVENOUS | Status: DC | PRN
  Administered 2012-09-09: 2 g via INTRAVENOUS

## 2012-09-09 MED ORDER — POLYETHYLENE GLYCOL 3350 17 G PO PACK
17.0000 g | PACK | Freq: Every day | ORAL | Status: DC | PRN
  Filled 2012-09-09: qty 1

## 2012-09-09 MED ORDER — KETAMINE HCL 10 MG/ML IJ SOLN
INTRAMUSCULAR | Status: DC | PRN
  Administered 2012-09-09: 30 mg via INTRAVENOUS

## 2012-09-09 MED ORDER — FLEET ENEMA 7-19 GM/118ML RE ENEM: 1.0000 | ENEMA | Freq: Once | RECTAL | Status: AC | PRN

## 2012-09-09 MED ORDER — CEFAZOLIN SODIUM-DEXTROSE 2-3 GM-% IV SOLR
2.0000 g | Freq: Four times a day (QID) | INTRAVENOUS | Status: AC
  Administered 2012-09-09 – 2012-09-10 (×2): 2 g via INTRAVENOUS
  Filled 2012-09-09 (×2): qty 50

## 2012-09-09 MED ORDER — ACETAMINOPHEN 10 MG/ML IV SOLN
INTRAVENOUS | Status: DC | PRN
  Administered 2012-09-09: 1000 mg via INTRAVENOUS

## 2012-09-09 MED ORDER — BISACODYL 10 MG RE SUPP: 10.0000 mg | Freq: Every day | RECTAL | Status: DC | PRN

## 2012-09-09 MED ORDER — FENTANYL CITRATE 0.05 MG/ML IJ SOLN
50.0000 ug | Freq: Once | INTRAMUSCULAR | Status: AC
  Administered 2012-09-09: 100 ug via INTRAVENOUS

## 2012-09-09 SURGICAL SUPPLY — 47 items
BAG ZIPLOCK 12X15 (MISCELLANEOUS) ×2 IMPLANT
BLADE SAW SGTL 18X1.27X75 (BLADE) ×2 IMPLANT
CAPT HIP FX BIPOLAR/UNIPOLAR ×2 IMPLANT
CLOTH BEACON ORANGE TIMEOUT ST (SAFETY) ×2 IMPLANT
DERMABOND ADVANCED (GAUZE/BANDAGES/DRESSINGS)
DERMABOND ADVANCED .7 DNX12 (GAUZE/BANDAGES/DRESSINGS) IMPLANT
DRAPE INCISE IOBAN 85X60 (DRAPES) ×4 IMPLANT
DRAPE ORTHO SPLIT 77X108 STRL (DRAPES) ×2
DRAPE POUCH INSTRU U-SHP 10X18 (DRAPES) ×2 IMPLANT
DRAPE SURG 17X11 SM STRL (DRAPES) ×2 IMPLANT
DRAPE SURG ORHT 6 SPLT 77X108 (DRAPES) ×2 IMPLANT
DRAPE U-SHAPE 47X51 STRL (DRAPES) ×2 IMPLANT
DRSG AQUACEL AG ADV 3.5X10 (GAUZE/BANDAGES/DRESSINGS) IMPLANT
DRSG TEGADERM 4X4.75 (GAUZE/BANDAGES/DRESSINGS) ×2 IMPLANT
DURAPREP 26ML APPLICATOR (WOUND CARE) ×2 IMPLANT
ELECT BLADE TIP CTD 4 INCH (ELECTRODE) ×2 IMPLANT
ELECT REM PT RETURN 9FT ADLT (ELECTROSURGICAL) ×2
ELECTRODE REM PT RTRN 9FT ADLT (ELECTROSURGICAL) ×1 IMPLANT
EVACUATOR 1/8 PVC DRAIN (DRAIN) IMPLANT
FACESHIELD LNG OPTICON STERILE (SAFETY) ×8 IMPLANT
GAUZE SPONGE 2X2 8PLY STRL LF (GAUZE/BANDAGES/DRESSINGS) IMPLANT
GLOVE BIOGEL PI IND STRL 7.5 (GLOVE) ×1 IMPLANT
GLOVE BIOGEL PI IND STRL 8 (GLOVE) ×1 IMPLANT
GLOVE BIOGEL PI INDICATOR 7.5 (GLOVE) ×1
GLOVE BIOGEL PI INDICATOR 8 (GLOVE) ×1
GLOVE ECLIPSE 8.0 STRL XLNG CF (GLOVE) ×2 IMPLANT
GLOVE ORTHO TXT STRL SZ7.5 (GLOVE) ×8 IMPLANT
GLOVE SURG ORTHO 8.0 STRL STRW (GLOVE) ×2 IMPLANT
GOWN BRE IMP PREV XXLGXLNG (GOWN DISPOSABLE) ×2 IMPLANT
GOWN STRL NON-REIN LRG LVL3 (GOWN DISPOSABLE) ×2 IMPLANT
HANDPIECE INTERPULSE COAX TIP (DISPOSABLE)
IMMOBILIZER KNEE 20 (SOFTGOODS)
IMMOBILIZER KNEE 20 THIGH 36 (SOFTGOODS) IMPLANT
KIT BASIN OR (CUSTOM PROCEDURE TRAY) ×2 IMPLANT
MANIFOLD NEPTUNE II (INSTRUMENTS) ×2 IMPLANT
PACK TOTAL JOINT (CUSTOM PROCEDURE TRAY) ×2 IMPLANT
POSITIONER SURGICAL ARM (MISCELLANEOUS) ×2 IMPLANT
SET HNDPC FAN SPRY TIP SCT (DISPOSABLE) IMPLANT
SPONGE GAUZE 2X2 STER 10/PKG (GAUZE/BANDAGES/DRESSINGS)
STRIP CLOSURE SKIN 1/2X4 (GAUZE/BANDAGES/DRESSINGS) ×4 IMPLANT
SUT ETHIBOND NAB CT1 #1 30IN (SUTURE) ×2 IMPLANT
SUT MNCRL AB 4-0 PS2 18 (SUTURE) ×2 IMPLANT
SUT VIC AB 1 CT1 36 (SUTURE) ×4 IMPLANT
SUT VIC AB 2-0 CT1 27 (SUTURE) ×2
SUT VIC AB 2-0 CT1 TAPERPNT 27 (SUTURE) ×2 IMPLANT
TOWEL OR 17X26 10 PK STRL BLUE (TOWEL DISPOSABLE) ×2 IMPLANT
TRAY FOLEY CATH 14FRSI W/METER (CATHETERS) IMPLANT

## 2012-09-09 NOTE — Progress Notes (Signed)
ANTICOAGULATION CONSULT NOTE - Initial Consult  Pharmacy Consult for Warfarin Indication: LLE DVT  Allergies  Allergen Reactions  . Sulfonamide Derivatives Other (See Comments)    Pt is unsure of reaction    Patient Measurements: Height: 5\' 1"  (154.9 cm) Weight: 111 lb 15.9 oz (50.8 kg) IBW/kg (Calculated) : 47.8  Vital Signs: Temp: 98.5 F (36.9 C) (07/14 1810) Temp src: Oral (07/14 1311) BP: 148/55 mmHg (07/14 1810) Pulse Rate: 74 (07/14 1810)  Labs:  Recent Labs  09/08/12 0925 09/08/12 0945 09/09/12 0405 09/09/12 1130  HGB 13.1  --  10.5*  --   HCT 39.4  --  32.6*  --   PLT 240  --  198  --   APTT  --  29  --   --   LABPROT 19.1*  --   --  20.6*  INR 1.66*  --   --  1.83*  CREATININE 0.74  --  0.73  --     Estimated Creatinine Clearance: 36 ml/min (by C-G formula based on Cr of 0.73).   Medical History: Past Medical History  Diagnosis Date  . Macular degeneration   . COPD (chronic obstructive pulmonary disease)   . Hypertension   . Coronary artery disease   . Hypercholesterolemia   . GERD (gastroesophageal reflux disease)   . Asthmatic bronchitis   . History of colonic polyps   . DJD (degenerative joint disease)   . Lumbar back pain   . Osteoporosis   . History of transient ischemic attack (TIA)   . Anxiety   . Essential thrombocythemia   . Diverticulosis 05/10/2012  . Osteoarthrosis, unspecified whether generalized or localized, unspecified site 05/09/2012  . Memory loss 05/09/2012  . Abnormal weight gain 05/09/2012  . Phlebitis and thrombophlebitis of other deep vessels of lower extremities 07/10/2010  . Essential thrombocythemia 06/09/2009    Medications:  Scheduled:  . [facility] HOLD] aspirin EC  81 mg Oral q morning - 10a  . [facility] HOLD] atorvastatin  20 mg Oral QPM  .  ceFAZolin (ANCEF) IV  2 g Intravenous Q6H  . [facility] HOLD] cholecalciferol  1,000 Units Oral q morning - 10a  . docusate sodium  100 mg Oral BID  . [[facility] HOLD] feeding supplement  1  Container Oral TID BM  . ferrous sulfate  325 mg Oral TID PC  . [[facility] HOLD] hydroxyurea  1,000 mg Oral Custom  . [MAR HOLD] hydroxyurea  500 mg Oral Custom  . [MAR HOLD] memantine  5 mg Oral BID  . [facility] HOLD] metoprolol tartrate  25 mg Oral BID  . [[facility] HOLD] mometasone-formoterol  2 puff Inhalation BID  . [[facility] HOLD] montelukast  10 mg Oral QHS  . [[facility] HOLD] pantoprazole  40 mg Oral Daily  . [facility] HOLD] sertraline  25 mg Oral QPM   Infusions:  . dextrose 5 % and 0.45 % NaCl with KCl 20 mEq/L 75 mL/hr at 09/08/12 1330    Assessment: 79 yo F admit after ground level fall and displaced right femoral neck fracture. S/p right hemiarthropalsty on 7/14 - warfarin resumed postop.  Home dose 2.5mg  daily - last dose taken 7/11  INR 1.66 on admit (7/13), 1.83 today  CBC stable  Goal of Therapy:  INR 2-3   Plan:   Resume warfarin 2.5mg  tonight  Daily PT/INR  Loralee Pacas, PharmD, BCPS Pager: [phone] 09/09/2012,6:25 PM

## 2012-09-09 NOTE — Consult Note (Signed)
Reason for Consult: Right hip fracture Referring Physician:  [facility] via ER admission  Sherri Crawford is an 79 y.o. female.  HPI:  79yo female admitted after ground level fall.  Immediate onset pain inability to bear weight, obvious deformity  Past Medical History  Diagnosis Date  . Macular degeneration   . COPD (chronic obstructive pulmonary disease)   . Hypertension   . Coronary artery disease   . Hypercholesterolemia   . GERD (gastroesophageal reflux disease)   . Asthmatic bronchitis   . History of colonic polyps   . DJD (degenerative joint disease)   . Lumbar back pain   . Osteoporosis   . History of transient ischemic attack (TIA)   . Anxiety   . Essential thrombocythemia   . Diverticulosis 05/10/2012  . Osteoarthrosis, unspecified whether generalized or localized, unspecified site 05/09/2012  . Memory loss 05/09/2012  . Abnormal weight gain 05/09/2012  . Phlebitis and thrombophlebitis of other deep vessels of lower extremities 07/10/2010  . Essential thrombocythemia 06/09/2009    Past Surgical History  Procedure Laterality Date  . Appendectomy    . Cholecystectomy    . Tah and bso  08/21/1995  . Cataract extraction w/ intraocular lens  implant, bilateral    . Abdominal hysterectomy      Family History  Problem Relation Age of Onset  . Colon cancer Son 60  . Crohn's disease Son   . Cancer Maternal Aunt     Social History:  reports that she has never smoked. She has never used smokeless tobacco. She reports that she does not drink alcohol or use illicit drugs.  Allergies:  Allergies  Allergen Reactions  . Sulfonamide Derivatives Other (See Comments)    Pt is unsure of reaction    Medications:  I have reviewed the patient's current medications. Scheduled: . aspirin EC  81 mg Oral q morning - 10a  . atorvastatin  20 mg Oral QPM  . cholecalciferol  1,000 Units Oral q morning - 10a  . feeding supplement  1 Container Oral TID BM  . hydroxyurea  1,000 mg Oral  Custom  . hydroxyurea  500 mg Oral Custom  . memantine  5 mg Oral BID  . mometasone-formoterol  2 puff Inhalation BID  . montelukast  10 mg Oral QHS  . pantoprazole  40 mg Oral Daily  . sertraline  25 mg Oral QPM    Results for orders placed during the hospital encounter of 09/08/12 (from the past 24 hour(s))  BASIC METABOLIC PANEL     Status: Abnormal   Collection Time    09/08/12  9:25 AM      Result Value Range   Sodium 136  135 - 145 mEq/L   Potassium 3.8  3.5 - 5.1 mEq/L   Chloride 99  96 - 112 mEq/L   CO2 27  19 - 32 mEq/L   Glucose, Bld 135 (*) 70 - 99 mg/dL   BUN 19  6 - 23 mg/dL   Creatinine, Ser 2.95  0.50 - 1.10 mg/dL   Calcium 9.6  8.4 - 62.1 mg/dL   GFR calc non Af Amer 73 (*) >90 mL/min   GFR calc Af Amer 85 (*) >90 mL/min  CBC WITH DIFFERENTIAL     Status: Abnormal   Collection Time    09/08/12  9:25 AM      Result Value Range   WBC 11.1 (*) 4.0 - 10.5 K/uL   RBC 4.05  3.87 - 5.11 MIL/uL  Hemoglobin 13.1  12.0 - 15.0 g/dL   HCT 16.1  09.6 - 04.5 %   MCV 97.3  78.0 - 100.0 fL   MCH 32.3  26.0 - 34.0 pg   MCHC 33.2  30.0 - 36.0 g/dL   RDW 40.9 (*) 81.1 - 91.4 %   Platelets 240  150 - 400 K/uL   Neutrophils Relative % 90 (*) 43 - 77 %   Neutro Abs 10.0 (*) 1.7 - 7.7 K/uL   Lymphocytes Relative 6 (*) 12 - 46 %   Lymphs Abs 0.6 (*) 0.7 - 4.0 K/uL   Monocytes Relative 4  3 - 12 %   Monocytes Absolute 0.5  0.1 - 1.0 K/uL   Eosinophils Relative 0  0 - 5 %   Eosinophils Absolute 0.0  0.0 - 0.7 K/uL   Basophils Relative 0  0 - 1 %   Basophils Absolute 0.0  0.0 - 0.1 K/uL  PROTIME-INR     Status: Abnormal   Collection Time    09/08/12  9:25 AM      Result Value Range   Prothrombin Time 19.1 (*) 11.6 - 15.2 seconds   INR 1.66 (*) 0.00 - 1.49  TYPE AND SCREEN     Status: None   Collection Time    09/08/12  9:25 AM      Result Value Range   ABO/RH(D) A POS     Antibody Screen NEG     Sample Expiration 09/11/2012    ABO/RH     Status: None   Collection  Time    09/08/12  9:25 AM      Result Value Range   ABO/RH(D) A POS    APTT     Status: None   Collection Time    09/08/12  9:45 AM      Result Value Range   aPTT 29  24 - 37 seconds  URINALYSIS, ROUTINE W REFLEX MICROSCOPIC     Status: Abnormal   Collection Time    09/08/12 10:35 AM      Result Value Range   Color, Urine YELLOW  YELLOW   APPearance CLEAR  CLEAR   Specific Gravity, Urine 1.015  1.005 - 1.030   pH 7.5  5.0 - 8.0   Glucose, UA NEGATIVE  NEGATIVE mg/dL   Hgb urine dipstick SMALL (*) NEGATIVE   Bilirubin Urine NEGATIVE  NEGATIVE   Ketones, ur NEGATIVE  NEGATIVE mg/dL   Protein, ur 782 (*) NEGATIVE mg/dL   Urobilinogen, UA 0.2  0.0 - 1.0 mg/dL   Nitrite NEGATIVE  NEGATIVE   Leukocytes, UA NEGATIVE  NEGATIVE  URINE MICROSCOPIC-ADD ON     Status: None   Collection Time    09/08/12 10:35 AM      Result Value Range   Squamous Epithelial / LPF RARE  RARE   WBC, UA 0-2  <3 WBC/hpf   RBC / HPF 3-6  <3 RBC/hpf  SURGICAL PCR SCREEN     Status: None   Collection Time    09/08/12  6:48 PM      Result Value Range   MRSA, PCR NEGATIVE  NEGATIVE   Staphylococcus aureus NEGATIVE  NEGATIVE  CBC     Status: Abnormal   Collection Time    09/09/12  4:05 AM      Result Value Range   WBC 6.4  4.0 - 10.5 K/uL   RBC 3.34 (*) 3.87 - 5.11 MIL/uL   Hemoglobin 10.5 (*) 12.0 -  15.0 g/dL   HCT 16.1 (*) 09.6 - 04.5 %   MCV 97.6  78.0 - 100.0 fL   MCH 31.4  26.0 - 34.0 pg   MCHC 32.2  30.0 - 36.0 g/dL   RDW 40.9 (*) 81.1 - 91.4 %   Platelets 198  150 - 400 K/uL  BASIC METABOLIC PANEL     Status: Abnormal   Collection Time    09/09/12  4:05 AM      Result Value Range   Sodium 136  135 - 145 mEq/L   Potassium 3.9  3.5 - 5.1 mEq/L   Chloride 103  96 - 112 mEq/L   CO2 25  19 - 32 mEq/L   Glucose, Bld 154 (*) 70 - 99 mg/dL   BUN 14  6 - 23 mg/dL   Creatinine, Ser 7.82  0.50 - 1.10 mg/dL   Calcium 8.4  8.4 - 95.6 mg/dL   GFR calc non Af Amer 74 (*) >90 mL/min   GFR calc Af Amer  85 (*) >90 mL/min     X-ray: Right hip complete films  Displaced right femoral neck fracture Osteoporotic appearing bone quality  ROS: As reviewed from medical H&P Orthopedic issue outlined above  Blood pressure 113/40, pulse 78, temperature 98.5 F (36.9 C), temperature source Oral, resp. rate 16, height 5\' 1"  (1.549 m), weight 50.8 kg (111 lb 15.9 oz), SpO2 94.00%.  EXAM: Awake alert oriented in bed with son by bedside. Right LE NVI, pain with movement  Upper extremities normal, left LE without trauma General medical exam reviewed No active wheezing, shortness of breath Abdomen soft  Assessment/Plan: Displace right femoral neck fracture  NPO Consent ordered To OR tonight for right hemiarthropalsty  Kahdijah Errickson D 09/09/2012, 9:24 AM

## 2012-09-09 NOTE — Anesthesia Procedure Notes (Addendum)
Anesthesia Regional Block:  Femoral nerve block  Pre-Anesthetic Checklist: ,, timeout performed, Correct Patient, Correct Site, Correct Laterality, Correct Procedure, Correct Position, site marked, Risks and benefits discussed, at surgeon's request and post-op pain management  Laterality: Right  Prep: chloraprep       Needles:  Injection technique: Single-shot  Needle Type: Stimulator Needle - 80      Needle Gauge: 22 and 22 G  Needle insertion depth: 6 cm   Additional Needles:  Procedures: ultrasound guided (picture in chart) and nerve stimulator Femoral nerve block  Nerve Stimulator or Paresthesia:  Response: Twitch elicited, 0.5 mA,   Additional Responses:   Narrative:  Start time: 09/09/2012 3:10 PM End time: 09/09/2012 3:20 PM Injection made incrementally with aspirations every 5 mL.  Performed by: Personally  Anesthesiologist: Renold Don, MD  Additional Notes: BP cuff, EKG monitors applied. Sedation begun. Femoral artery palpated for location of nerve. Block placed under direct U/S guidance and nerve stimulation. After nerve location anesthetic injected incrementally, slowly, and after neg aspirations. Tolerated well.  Femoral nerve block Procedure Name: Intubation Date/Time: 09/09/2012 3:51 PM Performed by: Leroy Libman L Patient Re-evaluated:Patient Re-evaluated prior to inductionOxygen Delivery Method: Circle system utilized Preoxygenation: Pre-oxygenation with 100% oxygen Intubation Type: IV induction Ventilation: Mask ventilation without difficulty and Oral airway inserted - appropriate to patient size Tube type: Glide rite Tube size: 7.0 mm Number of attempts: 1 Airway Equipment and Method: Video-laryngoscopy (Practice with glidescope) Placement Confirmation: ETT inserted through vocal cords under direct vision,  breath sounds checked- equal and bilateral and positive ETCO2 Secured at: 21 cm Dental Injury: Teeth and Oropharynx as per pre-operative  assessment

## 2012-09-09 NOTE — Evaluation (Signed)
SLP Cancellation Note  Patient Details Name: Sherri Crawford MRN: [id number] DOB: Dec 15, 1920   Cancelled treatment:       Reason Eval/Treat Not Completed: Medical issues which prohibited therapy (pt npo for surgery that is planned this afternoon)  [name], MS [facility] SLP [phone]

## 2012-09-09 NOTE — Preoperative (Signed)
Beta Blockers   Reason not to administer Beta Blockers:Not Applicable 

## 2012-09-09 NOTE — Progress Notes (Signed)
Clinical Social Work Department BRIEF PSYCHOSOCIAL ASSESSMENT 09/09/2012  Patient:  Sherri Crawford, Sherri Crawford     Account Number:  [id number]     Admit date:  09/08/2012  Clinical Social Worker:  Orpah Greek  Date/Time:  09/09/2012 11:40 AM  Referred by:  Physician  Date Referred:  09/09/2012 Referred for  Other - See comment   Other Referral:   from: [facility] - ALF   Interview type:  Patient Other interview type:   and son, Gene at bedside    PSYCHOSOCIAL DATA Living Status:  FACILITY Admitted from facility:  [facility] Level of care:  Assisted Living Primary support name:  Sherri Crawford (son) h#: [phone] w#: [id number] c#: [phone] Primary support relationship to patient:  CHILD, ADULT Degree of support available:   good    CURRENT CONCERNS Current Concerns  Post-Acute Placement   Other Concerns:    SOCIAL WORK ASSESSMENT / PLAN CSW spoke with patient & son at bedside re: discharge planning. Patient was admitted from [facility]F, though depending on PT recommendation - might need SNF at discharge.   Assessment/plan status:  Information/Referral to [facility] Other assessment/ plan:   Information/referral to community resources:   CSW completed FL2 and faxed information to [facility] - confirmed with Katie @ [facility] that they will hold a bed for her at their SNF if needed.    PATIENT'S/FAMILY'S RESPONSE TO PLAN OF CARE: Patient was very pleased to hear that [facility] would hold a bed for her at their SNF, her husband resides in the long term section of the SNF @ [facility].       Unice Bailey, LCSW [facility] Clinical Social Worker cell #: [phone]

## 2012-09-09 NOTE — Transfer of Care (Signed)
Immediate Anesthesia Transfer of Care Note  Patient: Sherri Crawford  Procedure(s) Performed: Procedure(s): ARTHROPLASTY BIPOLAR HIP (Right)  Patient Location: PACU  Anesthesia Type:General  Level of Consciousness: sedated  Airway & Oxygen Therapy: Patient Spontanous Breathing and Patient connected to face mask oxygen  Post-op Assessment: Report given to PACU RN and Post -op Vital signs reviewed and stable  Post vital signs: Reviewed and stable  Complications: No apparent anesthesia complications

## 2012-09-09 NOTE — Anesthesia Preprocedure Evaluation (Addendum)
Anesthesia Evaluation  Patient identified by MRN, date of birth, ID band Patient awake    Reviewed: Allergy & Precautions, H&P , NPO status , Patient's Chart, lab work & pertinent test results, reviewed documented beta blocker date and time   Airway Mallampati: III TM Distance: >3 FB Neck ROM: Full    Dental  (+) Dental Advisory Given   Pulmonary asthma , COPD COPD inhaler,  breath sounds clear to auscultation        Cardiovascular hypertension, Pt. on home beta blockers and Pt. on medications + CAD and + Peripheral Vascular Disease Rhythm:Regular Rate:Normal     Neuro/Psych PSYCHIATRIC DISORDERS Anxiety negative neurological ROS     GI/Hepatic negative GI ROS, Neg liver ROS, GERD-  Medicated,  Endo/Other  negative endocrine ROS  Renal/GU negative Renal ROS     Musculoskeletal  (+) Arthritis -, Osteoarthritis,    Abdominal   Peds  Hematology negative hematology ROS (+)   Anesthesia Other Findings   Reproductive/Obstetrics negative OB ROS                          Anesthesia Physical Anesthesia Plan  ASA: IV  Anesthesia Plan: Regional and General   Post-op Pain Management: MAC Combined w/ Regional for Post-op pain   Induction: Intravenous  Airway Management Planned: Oral ETT  Additional Equipment:   Intra-op Plan:   Post-operative Plan: Extubation in OR  Informed Consent: I have reviewed the patients History and Physical, chart, labs and discussed the procedure including the risks, benefits and alternatives for the proposed anesthesia with the patient or authorized representative who has indicated his/her understanding and acceptance.   Dental advisory given  Plan Discussed with: CRNA  Anesthesia Plan Comments:        Anesthesia Quick Evaluation

## 2012-09-09 NOTE — Anesthesia Postprocedure Evaluation (Signed)
Anesthesia Post Note  Patient: Sherri Crawford  Procedure(s) Performed: Procedure(s) (LRB): ARTHROPLASTY BIPOLAR HIP (Right)  Anesthesia type: General  Patient location: PACU  Post pain: Pain level controlled  Post assessment: Post-op Vital signs reviewed  Last Vitals: BP 136/45  Pulse 65  Temp(Src) 36.8 C (Oral)  Resp 18  Ht 5\' 1"  (1.549 m)  Wt 111 lb 15.9 oz (50.8 kg)  BMI 21.17 kg/m2  SpO2 100%  Post vital signs: Reviewed  Level of consciousness: sedated  Complications: No apparent anesthesia complications

## 2012-09-09 NOTE — Progress Notes (Signed)
[facility] PROGRESS NOTE  Sherri CHILDREY ZOX:[id number] DOB: January 17, 1921 DOA: 09/08/2012 PCP: Michele Mcalpine, MD  Assessment/Plan: 1- Hip fracture Continue to hold coumadin. Will defer to ortho resume anticoagulation post surgery. Patient will need to be on anticoagulation post surgery as soon as possible due to history of DVT.  Surgery this afternoon.  Patient at moderate risk for surgery, discussed with family.  Resume metoprolol.   2. COPD with asthma  - stable, continue home regimen, Advair, Singulair,  -Albuterol PRN.   3. H/o DVT  -Hold coumadin prior to surgery.   4. HTN  --Continue with metoprolol.  5-CAD: continue with aspirin, Lipitor. Patient denies chest pain. EKG sinus rhythm, no st changes.  6-Esensitial Thrombocythemia: Continue with Hydroxyurea. Platelet at 198. 7-Anemia: Last Hb at 11 on May 2014. Follow trend.    Code Status: Full Family Communication: Care discussed with patient and son who was at bedside.  Disposition Plan: Likely SNF   Consultants:  Dr Charlann Boxer.   Procedures:  none  Antibiotics:  none  HPI/Subjective: Feeling well, denies chest pain or dyspnea.  Relates mild back and hip pain.   Objective: Filed Vitals:   09/09/12 0400 09/09/12 0413 09/09/12 0740 09/09/12 0833  BP:  113/40    Pulse:  78    Temp:  98.5 F (36.9 C)    TempSrc:  Oral    Resp: 18 16 16    Height:      Weight:      SpO2: 98% 93% 93% 94%    Intake/Output Summary (Last 24 hours) at 09/09/12 1043 Last data filed at 09/09/12 0700  Gross per 24 hour  Intake   1140 ml  Output   1450 ml  Net   -310 ml   Filed Weights   09/08/12 1229  Weight: 50.8 kg (111 lb 15.9 oz)    Exam:   General:  No distress.   Cardiovascular: S 1, S 2 RRR  Respiratory: CTA  Abdomen: BS present, soft, NT  Musculoskeletal: no edema.   Data Reviewed: Basic Metabolic Panel:  Recent Labs Lab 09/08/12 0925 09/09/12 0405  NA 136 136  K 3.8 3.9  CL 99 103  CO2  27 25  GLUCOSE 135* 154*  BUN 19 14  CREATININE 0.74 0.73  CALCIUM 9.6 8.4   Liver Function Tests: No results found for this basename: AST, ALT, ALKPHOS, BILITOT, PROT, ALBUMIN,  in the last 168 hours No results found for this basename: LIPASE, AMYLASE,  in the last 168 hours No results found for this basename: AMMONIA,  in the last 168 hours CBC:  Recent Labs Lab 09/08/12 0925 09/09/12 0405  WBC 11.1* 6.4  NEUTROABS 10.0*  --   HGB 13.1 10.5*  HCT 39.4 32.6*  MCV 97.3 97.6  PLT 240 198   Cardiac Enzymes: No results found for this basename: CKTOTAL, CKMB, CKMBINDEX, TROPONINI,  in the last 168 hours BNP (last 3 results) No results found for this basename: PROBNP,  in the last 8760 hours CBG: No results found for this basename: GLUCAP,  in the last 168 hours  Recent Results (from the past 240 hour(s))  SURGICAL PCR SCREEN     Status: None   Collection Time    09/08/12  6:48 PM      Result Value Range Status   MRSA, PCR NEGATIVE  NEGATIVE Final   Staphylococcus aureus NEGATIVE  NEGATIVE Final   Comment:            The  Xpert SA Assay (FDA     approved for NASAL specimens     in patients over 45 years of age),     is one component of     a comprehensive surveillance     program.  Test performance has     been validated by [employer] for patients greater     than or equal to 80 year old.     It is not intended     to diagnose infection nor to     guide or monitor treatment.     Studies: Dg Lumbar Spine Complete  09/08/2012   *RADIOLOGY REPORT*  Clinical Data: Fall with low back pain.  LUMBAR SPINE - COMPLETE 4+ VIEW  Comparison: None.  Findings: Diffuse spondylosis of the lumbar spine noted.  There is no evidence of acute fracture.  There is mild grade 1 retrolisthesis of L1 on L2.  The aorta and iliac arteries are heavily calcified.  IMPRESSION: Diffuse lumbar spondylosis.  No evidence of fracture.   Original Report Authenticated By: Irish Lack, M.D.   Dg  Hip Complete Right  09/08/2012   *RADIOLOGY REPORT*  Clinical Data: Fall with right hip pain.  RIGHT HIP - COMPLETE 2+ VIEW  Comparison: None.  Findings: There is evidence of a displaced subcapital hip fracture on the right.  No dislocation is identified.  The bony pelvis is intact.  IMPRESSION: Displaced right subcapital hip fracture.   Original Report Authenticated By: Irish Lack, M.D.   Ct Head Wo Contrast  09/08/2012   *RADIOLOGY REPORT*  Clinical Data: Post fall.  On Coumadin.  CT HEAD WITHOUT CONTRAST  Technique:  Contiguous axial images were obtained from the base of the skull through the vertex without contrast.  Comparison: 07/19/2009.  Findings: No skull fracture or intracranial hemorrhage.  Small vessel disease type changes without CT evidence of large acute infarct.  No hydrocephalus.  Age related atrophy.  No intracranial mass lesion detected on this unenhanced exam.  Vascular calcifications.  Mastoid air cells, middle ear cavities and visualized sinuses are clear.  Orbital structures unremarkable.  IMPRESSION: No skull fracture or intracranial hemorrhage.  Small vessel disease type changes without CT evidence of large acute infarct.   Original Report Authenticated By: Lacy Duverney, M.D.   Dg Chest Portable 1 View  09/08/2012   *RADIOLOGY REPORT*  Clinical Data: Preoperative respiratory exam for right hip fracture.  PORTABLE CHEST - 1 VIEW  Comparison: 07/25/2011  Findings: Mild scarring/atelectasis present at the right lung base. No edema, infiltrate or pleural effusion is seen.  Heart size is normal.  IMPRESSION: Mild scarring/atelectasis at the right lung base.  No active disease.   Original Report Authenticated By: Irish Lack, M.D.    Scheduled Meds: . aspirin EC  81 mg Oral q morning - 10a  . atorvastatin  20 mg Oral QPM  . cholecalciferol  1,000 Units Oral q morning - 10a  . feeding supplement  1 Container Oral TID BM  . hydroxyurea  1,000 mg Oral Custom  . hydroxyurea  500  mg Oral Custom  . memantine  5 mg Oral BID  . mometasone-formoterol  2 puff Inhalation BID  . montelukast  10 mg Oral QHS  . pantoprazole  40 mg Oral Daily  . sertraline  25 mg Oral QPM   Continuous Infusions: . dextrose 5 % and 0.45 % NaCl with KCl 20 mEq/L 75 mL/hr at 09/08/12 1330    Active Problems:  Essential hypertension   COPD with asthma   BACK PAIN, LUMBAR   DVT (deep venous thrombosis)   Anxiety and depression   Hip fracture    Time spent: 25 minutes.     Randell Detter  [facility] Pager [phone]. If 7PM-7AM, please contact night-coverage at [email or web address], password [facility] 09/09/2012, 10:43 AM  LOS: 1 day

## 2012-09-09 NOTE — Op Note (Signed)
NAME:  Sherri Crawford                ACCOUNT NO.:  [id number]   MEDICAL RECORD NO.: [id number]   LOCATION:  1435                         FACILITY:  [facility]   DATE OF BIRTH:  12-12-2020  PHYSICIAN:  Madlyn Frankel. Charlann Boxer, M.D.     DATE OF PROCEDURE:  09/09/12                               OPERATIVE REPORT     PREOPERATIVE DIAGNOSIS:  Right displaced femoral neck fracture.   POSTOPERATIVE DIAGNOSIS:  Right displaced femoral neck fracture.   PROCEDURE:  Right hip hemiarthroplasty utilizing DePuy component, size 5 standard Summit Basic press fit stem with a 42 unipolar ball with a +5 adapter.   SURGEON:  Madlyn Frankel. Charlann Boxer, MD   ASSISTANT:  Lanney Gins, PA-C.   ANESTHESIA:  General plus FNB   SPECIMENS:  None.   DRAINS:  One medium Hemovac.   BLOOD LOSS:  About 150cc.   COMPLICATIONS:  None.   INDICATION OF PROCEDURE:  Sherri Crawford is a 79 year old female who lives independently.  She unfortunately had a fall at her residence.  She was admitted to the hospital after radiographs revealed a femoral neck fracture.  She was seen and evaluated and was scheduled for surgery for fixation.  The necessity of surgical repair was discussed with she and her son.  Consent was obtained after reviewing risks of infection, DVT, component failure, and need for revision surgery.   PROCEDURE IN DETAIL:  The patient was brought to the operative theater. Once adequate anesthesia, preoperative antibiotics, 2 g of Ancef administered, the patient was positioned into the left lateral decubitus position with the right side up.  The right lower extremity was then prepped and draped in sterile fashion.  A time-out was performed identifying the patient, planned procedure, and extremity.   A lateral incision was made off the proximal trochanter. Sharp dissection was carried down to the iliotibial band and gluteal fascia. The gluteal fascia was then incised for posterior approach.  The short external rotators  were taken down separate from the posterior capsule. An L capsulotomy was made preserving the posterior leaflet for later anatomic repair. Fracture site was identified and after removing comminuted segments of the posterior femoral neck, the femoral head was removed without difficulty and measured on the back table  using the sizing rings and determined to be 42 mm in diameter.   The proximal femur was then exposed.  Retractors placed.  I then drilled, opened the proximal femur.  Then I hand reamed once and  Irrigated the canal to try to prevent fat emboli.  I began broaching the femur with a size 1 broach up to a size 5 broach with good medial and lateral metaphyseal fit without evidence of any torsion or movement.  A trial reduction was carried out with a standard neck and a +0 then +5 adapter with a 41 ball.  The hip reduced nicely.  The leg lengths appeared to be equal compared to the down leg.   The hip went through a range of motion without evidence of any subluxation or impingement.   Given these findings, the trial components removed.  The final 5 standard pressfit Summit Basic stem was opened.  After  irrigating the canal, the final stem was impacted and sat at the level where the broach was. Based on this and the trial reduction, a +5 adapter was opened and impacted in the 42 unipolar ball onto a clean and dry trunnion.  The hip had been irrigated throughout the case and again at this point.  I re- Approximated the posterior capsule to the superior leaflet using a  #1 Vicryl,  and placed a medium Hemovac drain deep.  The remainder of the wound was closed with #1 Vicryl in the iliotibial band and gluteal fascia, a  2-0 Vicryl in the sub-Q tissue and a running 4-0 Monocryl in the skin.  The hip was cleaned, dried, and dressed sterilely using Dermabond and Aquacel dressing.  Drain site was dressed separately.  She was then brought to recovery room, extubated in stable condition,  tolerating the procedure well.  Lanney Gins, PA-C was present and utilized as Geophysicist/field seismologist for the entire case from  Preoperative positioning to management of the contralateral extremity and retractors to  General facilitation of the procedure.  He was also involved with primary wound closure.         Madlyn Frankel Charlann Boxer, M.D.

## 2012-09-09 NOTE — Progress Notes (Signed)
RD Consulted due to Hip Fracture (Hip Fracture Protocol)  Pt down for surgery at time of visit. RD to follow-up tomorrow.  Ian Malkin RD, LDN Inpatient Clinical Dietitian Pager: [phone] After Hours Pager: [phone]

## 2012-09-10 ENCOUNTER — Encounter (HOSPITAL_COMMUNITY): Payer: Self-pay | Admitting: Orthopedic Surgery

## 2012-09-10 DIAGNOSIS — D473 Essential (hemorrhagic) thrombocythemia: Secondary | ICD-10-CM

## 2012-09-10 LAB — BASIC METABOLIC PANEL
BUN: 10 mg/dL (ref 6–23)
Calcium: 8.4 mg/dL (ref 8.4–10.5)
GFR calc non Af Amer: 74 mL/min — ABNORMAL LOW (ref >90)
Glucose, Bld: 133 mg/dL — ABNORMAL HIGH (ref 70–99)

## 2012-09-10 LAB — CBC
HCT: 30.1 % — ABNORMAL LOW (ref 36.0–46.0)
Hemoglobin: 9.6 g/dL — ABNORMAL LOW (ref 12.0–15.0)
MCH: 31.9 pg (ref 26.0–34.0)
MCHC: 31.9 g/dL (ref 30.0–36.0)
MCV: 100 fL (ref 78.0–100.0)

## 2012-09-10 MED ORDER — WARFARIN SODIUM 1 MG PO TABS
1.5000 mg | ORAL_TABLET | Freq: Once | ORAL | Status: AC
  Administered 2012-09-10: 1.5 mg via ORAL
  Filled 2012-09-10: qty 1

## 2012-09-10 MED ORDER — ADULT MULTIVITAMIN W/MINERALS CH
1.0000 | ORAL_TABLET | Freq: Every day | ORAL | Status: DC
  Administered 2012-09-10 – 2012-09-11 (×2): 1 via ORAL
  Filled 2012-09-10 (×2): qty 1

## 2012-09-10 MED ORDER — POLYETHYLENE GLYCOL 3350 17 G PO PACK
17.0000 g | PACK | Freq: Every day | ORAL | Status: DC
  Administered 2012-09-10 – 2012-09-11 (×2): 17 g via ORAL
  Filled 2012-09-10 (×2): qty 1

## 2012-09-10 MED ORDER — ENSURE COMPLETE PO LIQD
237.0000 mL | ORAL | Status: DC
  Administered 2012-09-10: 237 mL via ORAL

## 2012-09-10 MED FILL — Ropivacaine HCl Inj 5 MG/ML: INTRAMUSCULAR | Qty: 30 | Status: AC

## 2012-09-10 NOTE — Evaluation (Signed)
Physical Therapy Evaluation Patient Details Name: Sherri Crawford MRN: [id number] DOB: May 05, 1920 Today's Date: 09/10/2012 Time: 4098-1191 PT Time Calculation (min): 50 min  PT Assessment / Plan / Recommendation History of Present Illness  79 y.o. female admitted with R femoral neck fx, s/p bipolar hip arthroplasty.  Clinical Impression  *+2 total assist for supine to sit, pt became nauseous while sitting at EOB. She would benefit from acute PT to maximize safety and independence with mobility. **    PT Assessment  Patient needs continued PT services    Follow Up Recommendations  SNF;Supervision/Assistance - 24 hour    Does the patient have the potential to tolerate intense rehabilitation      Barriers to Discharge        Equipment Recommendations  None recommended by PT    Recommendations for Other Services OT consult   Frequency Min 5X/week    Precautions / Restrictions Precautions Precautions: Fall;Posterior Hip Precaution Comments: instructed pt/daughter in posterior hip precautions Restrictions Weight Bearing Restrictions: No Other Position/Activity Restrictions: WBAT   Pertinent Vitals/Pain **8/10 R hip Premedicated, ice applied*      Mobility  Bed Mobility Bed Mobility: Supine to Sit Supine to Sit: 1: +2 Total assist Supine to Sit: Patient Percentage: 10% Details for Bed Mobility Assistance: assist to advance BLEs and to raise trunk Transfers Transfers: Not assessed (pt nauseous in sitting)    Exercises Total Joint Exercises Ankle Circles/Pumps: AROM;Both;5 reps Heel Slides: AAROM;Right;10 reps Hip ABduction/ADduction: AAROM;Right;10 reps Long Arc Quad: AAROM;Right;5 reps   PT Diagnosis: Difficulty walking;Generalized weakness;Acute pain  PT Problem List: Decreased strength;Decreased range of motion;Decreased activity tolerance;Decreased mobility;Decreased knowledge of precautions;Pain PT Treatment Interventions: DME instruction;Gait  training;Functional mobility training;Therapeutic activities;Therapeutic exercise;Patient/family education     PT Goals(Current goals can be found in the care plan section) Acute Rehab PT Goals Patient Stated Goal: to get stronger PT Goal Formulation: With patient/family Time For Goal Achievement: 09/24/12 Potential to Achieve Goals: Good  Visit Information  Last PT Received On: 09/10/12 Assistance Needed: +2 PT/OT Co-Evaluation/Treatment: Yes History of Present Illness: 79 y.o. female admitted with R femoral neck fx, s/p bipolar hip arthroplasty.       Prior Functioning  Home Living Family/patient expects to be discharged to:: Skilled nursing facility Home Equipment: Walker - 4 wheels;Grab bars - toilet Additional Comments: pt was in ALF at [facility] Prior Function Level of Independence: Independent with assistive device(s) Comments: 1-2 falls in past year, assist to get into whirlpool tub at ALF but washes herself and dresses herself Communication Communication: No difficulties    Cognition  Cognition Arousal/Alertness: Awake/alert Behavior During Therapy: WFL for tasks assessed/performed Overall Cognitive Status: Within Functional Limits for tasks assessed    Extremity/Trunk Assessment Upper Extremity Assessment Upper Extremity Assessment: Overall WFL for tasks assessed Lower Extremity Assessment Lower Extremity Assessment: RLE deficits/detail RLE Deficits / Details: knee extension 2/5, limited by pain, ankle DF 3/5, hip AAROM limited 75% by pain, R hip internally rotated at rest, sensation intact Cervical / Trunk Assessment Cervical / Trunk Assessment: Kyphotic   Balance Balance Balance Assessed: Yes Static Sitting Balance Static Sitting - Balance Support: Bilateral upper extremity supported;Feet supported Static Sitting - Level of Assistance: 5: Stand by assistance Static Sitting - Comment/# of Minutes: 4  End of Session PT - End of Session Equipment  Utilized During Treatment: Gait belt Activity Tolerance: Patient limited by fatigue;Treatment limited secondary to medical complications (Comment) (limited by nausea/dizziness in sitting) Patient left: in bed;with call bell/phone  within reach;with family/visitor present Nurse Communication: Mobility status  GP     Sherri Crawford 09/10/2012, 2:14 PM [phone]

## 2012-09-10 NOTE — Progress Notes (Signed)
   Subjective: 1 Day Post-Op Procedure(s) (LRB): ARTHROPLASTY BIPOLAR HIP (Right)   Patient reports pain as mild, pain well controlled. States that it can get quite tender over the area of the bandage. No events throughout the night.   Objective:   VITALS:   Filed Vitals:   09/10/12 0507  BP: 136/43  Pulse: 76  Temp: 99.8 F (37.7 C)   Resp: 16    Neurovascular intact Dorsiflexion/Plantar flexion intact Incision: dressing C/D/I No cellulitis present Compartment soft  LABS  Recent Labs  09/08/12 0925 09/09/12 0405 09/10/12 0408  HGB 13.1 10.5* 9.6*  HCT 39.4 32.6* 30.1*  WBC 11.1* 6.4 5.9  PLT 240 198 199     Recent Labs  09/08/12 0925 09/09/12 0405 09/10/12 0408  NA 136 136 135  K 3.8 3.9 4.5  BUN 19 14 10   CREATININE 0.74 0.73 0.72  GLUCOSE 135* 154* 133*     Assessment/Plan: 1 Day Post-Op Procedure(s) (LRB): ARTHROPLASTY BIPOLAR HIP (Right)  Advance diet Up with therapy  WBAT right leg    Sherri Crawford. Sherri Crawford   PAC  09/10/2012, 4:01 PM

## 2012-09-10 NOTE — Progress Notes (Signed)
Chaplain provided support with pt around hospitalization, recent surgery, discharge at nursing referral.  Pt is well supported by family.  Husband is at [facility].  He suffered broken hip last year and is wheelchair-bound.  Pt is excited about returning to [facility] to be with him.    Will continue to follow for support and encouragement as needed.   Belva Crome

## 2012-09-10 NOTE — Evaluation (Signed)
Occupational Therapy Evaluation Patient Details Name: Sherri Crawford MRN: [id number] DOB: 1920/03/26 Today's Date: 09/10/2012 Time: 4098-1191 OT Time Calculation (min): 30 min  OT Assessment / Plan / Recommendation History of present illness 79 y.o. female admitted with R femoral neck fx, s/p bipolar hip arthroplasty.   Clinical Impression   Pt sustained a fall and is now s/p R THA.  She will benefit from continued OT to increase safety and independence with adls with mod A level goals in acute.  Pt was mod I with adls prior to admission.      OT Assessment  Patient needs continued OT Services    Follow Up Recommendations  SNF    Barriers to Discharge      Equipment Recommendations  3 in 1 bedside comode    Recommendations for Other Services    Frequency  Min 2X/week    Precautions / Restrictions Precautions Precautions: Fall;Posterior Hip Precaution Comments: instructed pt/daughter in posterior hip precautions Restrictions Weight Bearing Restrictions: No Other Position/Activity Restrictions: WBAT   Pertinent Vitals/Pain Did not c/o pain but stated hip felt better when repositioned with ice applied    ADL  Eating/Feeding: Independent Where Assessed - Eating/Feeding: Bed level Grooming: Set up Where Assessed - Grooming: Supine, head of bed up Upper Body Bathing: Set up Where Assessed - Upper Body Bathing: Supine, head of bed up Lower Body Bathing: +2 Total assistance Lower Body Bathing: Patient Percentage: 10% Where Assessed - Lower Body Bathing: Rolling right and/or left Upper Body Dressing: Minimal assistance Where Assessed - Upper Body Dressing: Unsupported sitting Lower Body Dressing: +2 Total assistance Lower Body Dressing: Patient Percentage: 0% Transfers/Ambulation Related to ADLs: pt sat eob and felt nauseas.  Returned to supine ADL Comments: reviewed adls and precautions.  did not show AE as pt was nauseas and fatiqued when we assisted her back to bed.   could not perform adls at eob due to nausea.  Pt will need reinforcement with precautions.  She has a Sports administrator but has never used it for adls.   Pt has a strong tendency for R internal rotation   OT Diagnosis: Generalized weakness  OT Problem List: Decreased strength;Decreased activity tolerance;Decreased knowledge of use of DME or AE;Decreased knowledge of precautions;Pain OT Treatment Interventions: Self-care/ADL training;DME and/or AE instruction;Therapeutic activities;Patient/family education   OT Goals(Current goals can be found in the care plan section) Acute Rehab OT Goals Patient Stated Goal: to get stronger OT Goal Formulation: With patient/family Time For Goal Achievement: 09/17/12 Potential to Achieve Goals: Good ADL Goals Pt Will Perform Lower Body Bathing: with mod assist;sit to/from stand;with adaptive equipment Pt Will Perform Lower Body Dressing: with mod assist;with adaptive equipment;sit to/from stand Pt Will Transfer to Toilet: with mod assist;stand pivot transfer;bedside commode Pt/caregiver will Perform Home Exercise Program: For increased ROM;For increased strengthening;With minimal assist Additional ADL Goal #1: pt will verbalize 3/3 thps  Visit Information  Last OT Received On: 09/10/12 Assistance Needed: +2 History of Present Illness: 79 y.o. female admitted with R femoral neck fx, s/p bipolar hip arthroplasty.       Prior Functioning     Home Living Family/patient expects to be discharged to:: Skilled nursing facility Home Equipment: Walker - 4 wheels;Grab bars - toilet Additional Comments: pt was in ALF at [facility] P[facility] Prior Function Level of Independence: Independent with assistive device(s) Comments: 1-2 falls in past year, assist to get into whirlpool tub at ALF but washes herself and dresses herself Communication Communication: No difficulties  Vision/Perception     Cognition  Cognition Arousal/Alertness: Awake/alert Behavior  During Therapy: WFL for tasks assessed/performed Overall Cognitive Status: Within Functional Limits for tasks assessed    Extremity/Trunk Assessment Upper Extremity Assessment Upper Extremity Assessment: Generalized weakness (strength grossly 4-/5) Lower Extremity Assessment Lower Extremity Assessment: RLE deficits/detail RLE Deficits / Details: knee extension 2/5, limited by pain, ankle DF 3/5, hip AAROM limited 75% by pain, R hip internally rotated at rest, sensation intact Cervical / Trunk Assessment Cervical / Trunk Assessment: Kyphotic     Mobility Bed Mobility Bed Mobility: Supine to Sit Supine to Sit: 1: +2 Total assist Supine to Sit: Patient Percentage: 10% Details for Bed Mobility Assistance: assist to advance BLEs and to raise trunk     Exercise    Balance Balance Balance Assessed: Yes Static Sitting Balance Static Sitting - Balance Support: Bilateral upper extremity supported;Feet supported Static Sitting - Level of Assistance: 5: Stand by assistance Static Sitting - Comment/# of Minutes: 4   End of Session OT - End of Session Activity Tolerance: Patient limited by fatigue (and nausea) Patient left: in bed;with call bell/phone within reach;with family/visitor present  GO     Sherri Crawford 09/10/2012, 2:31 PM Marica Otter, OTR/L [phone] 09/10/2012

## 2012-09-10 NOTE — Progress Notes (Signed)
Pleasant 79 year old woman currently residing at the [facility] assisted living facility. She is followed in our office  for essential thrombocythemia and is on chronic oral chemotherapy with hydroxyurea 500 mg on Wednesdays, Fridays, and Sundays and 1,000 mg on Mondays and low dose ASA 81 mg.  We also monitor her Coumadin status post unprovoked DVT left lower extremity May 2012 probably related to her underlying myeloproliferative disorder. We have had some difficulty getting her on a stable dose but the dose range is becoming more narrow between 2 and 3 mg of Coumadin. Current dose is 2.5 mg. She now presents with a traumatic right hip fracture after a fall. She had a right hip arthroplasty yesterday, 7/14, by Dr. Charlann Boxer. Pre-op Hemoglobin 10.5, today 9.6. Platelets 198,000. INR today 2.2 Exam: Neuro: alert, oriented.  Right pupil pinpoint and smaller than left. Lungs clear Heart: regular rhythm Abdomen soft, non-tender Extremities:  Dressing over right hip wound dry. Right leg everted. No edema, no calf tenderness.  Impression: #1.  Myeloproliferative disorder: essential thrombocythemia Platelet count stable - continue current dose Hydrea #2. 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

## 2012-09-10 NOTE — Progress Notes (Signed)
ANTICOAGULATION CONSULT NOTE - Follow up Consult  Pharmacy Consult for Warfarin Indication: Hx LLE DVT  Allergies  Allergen Reactions  . Sulfonamide Derivatives Other (See Comments)    Pt is unsure of reaction    Patient Measurements: Height: 5\' 1"  (154.9 cm) Weight: 111 lb 15.9 oz (50.8 kg) IBW/kg (Calculated) : 47.8  Vital Signs: Temp: 98.8 F (37.1 C) (07/15 0838) Temp src: Oral (07/15 0838) BP: 107/39 mmHg (07/15 0838) Pulse Rate: 73 (07/15 0838)  Labs:  Recent Labs  09/08/12 0925 09/08/12 0945 09/09/12 0405 09/09/12 1130 09/10/12 0408  HGB 13.1  --  10.5*  --  9.6*  HCT 39.4  --  32.6*  --  30.1*  PLT 240  --  198  --  199  APTT  --  29  --   --   --   LABPROT 19.1*  --   --  20.6* 24.1*  INR 1.66*  --   --  1.83* 2.24*  CREATININE 0.74  --  0.73  --  0.72    Estimated Creatinine Clearance: 36 ml/min (by C-G formula based on Cr of 0.72).  Assessment: 79 yo F admit after ground level fall and displaced right femoral neck fracture. S/p right hemiarthropalsty on 7/14.  Pharmacy asked to assist with dosing warfarin resumed postop.  Home dose 2.5mg  daily - last dose taken 7/11, resumed on 7/14.  INR 2.24 today, moderate increase from 1.83 yesterday.   CBC:  H/H decreased, Plt stable/wnl post-op.  Goal of Therapy:  INR 2-3   Plan:   Warfarin 1.5 mg PO tonight at 1800  Conservative warfarin dosing d/t recent surgery.  Daily PT/INR    Lynann Beaver PharmD, BCPS Pager [phone] 09/10/2012 10:16 AM

## 2012-09-10 NOTE — Evaluation (Signed)
Clinical/Bedside Swallow Evaluation Patient Details  Name: Sherri Crawford MRN: [id number] Date of Birth: 07-Feb-1921  Today's Date: 09/10/2012 Time: 4098-1191 SLP Time Calculation (min): 34 min  Past Medical History:  Past Medical History  Diagnosis Date  . Macular degeneration   . COPD (chronic obstructive pulmonary disease)   . Hypertension   . Coronary artery disease   . Hypercholesterolemia   . GERD (gastroesophageal reflux disease)   . Asthmatic bronchitis   . History of colonic polyps   . DJD (degenerative joint disease)   . Lumbar back pain   . Osteoporosis   . History of transient ischemic attack (TIA)   . Anxiety   . Essential thrombocythemia   . Diverticulosis 05/10/2012  . Osteoarthrosis, unspecified whether generalized or localized, unspecified site 05/09/2012  . Memory loss 05/09/2012  . Abnormal weight gain 05/09/2012  . Phlebitis and thrombophlebitis of other deep vessels of lower extremities 07/10/2010  . Essential thrombocythemia 06/09/2009   Past Surgical History:  Past Surgical History  Procedure Laterality Date  . Appendectomy    . Cholecystectomy    . Tah and bso  08/21/1995  . Cataract extraction w/ intraocular lens  implant, bilateral    . Abdominal hysterectomy    . Hip arthroplasty Right 09/09/2012    Procedure: ARTHROPLASTY BIPOLAR HIP;  Surgeon: Shelda Pal, MD;  Location: [facility]S;  Service: Orthopedics;  Laterality: Right;   HPI:  79 yo admitted after suffering a fall with hip fx, s/p surgical repair yesterday.  PMH + for asthma, COPD, GERD, HH, myleoprliferate disorder on chronic chemo, and anemia.  Swallow evaluation ordered on date of admit.  SLP called MD to clarify order and MD desired slp to proceed with BSE.     Assessment / Plan / Recommendation Clinical Impression  Pt presents with clinical indication of functional oropharyngeal swallow ability, no focal CN deficits impacting swallow musculature.  Observed pt eating salad, beef,  applesauce with medicine from RN, and drinking water.    Pt is on a regular/thin diet at [facility] and denies any dysphagia.  She does have hiatal hernia and GERD and takes a PPI.  Xerostomia admitted by pt with direct question, slp provided verbal/written compensations.  Poor appetite even prior to admission reported by pt, for which she takes a liquid supplement to compensate.    Educated pt and family to precautions given pt's chronic respiratory issues increasing her risk and now with decreased ability to ambulate, increased ramifications.  SLP used teach back with pt to assure understanding. No further SLP indicated as all education completed and pt is near baseline swallowing (pharyngeal soreness from surgery).     Aspiration Risk  Mild    Diet Recommendation Regular;Thin liquid   Liquid Administration via: Cup;Straw Medication Administration:  (as per pt, ? with applesauce? ) Supervision: Patient able to self feed Compensations: Slow rate;Small sips/bites Postural Changes and/or Swallow Maneuvers: Seated upright 90 degrees;Upright 30-60 min after meal    Other  Recommendations Oral Care Recommendations: Oral care QID   Follow Up Recommendations  None    Frequency and Duration        Pertinent Vitals/Pain Afebrile, clear    SLP Swallow Goals  n/a   Swallow Study Prior Functional Status   see H/P and clinical impressions    General Date of Onset: 09/10/12 HPI: 79 yo admitted after suffering a fall with hip fx, s/p surgical repair yesterday.  PMH + for asthma, COPD, GERD, HH, myleoprliferate disorder on  chronic chemo, and anemia.  Swallow evaluation ordered on date of admit.  SLP called MD to clarify order and MD desired slp to proceed with BSE.   Type of Study: Bedside swallow evaluation Diet Prior to this Study: Regular;Thin liquids Temperature Spikes Noted: No Respiratory Status: Supplemental O2 delivered via (comment) (on oxygen now) History of Recent Intubation:  Yes (for surgery) Behavior/Cognition: Alert;Cooperative;Pleasant mood Oral Cavity - Dentition: Adequate natural dentition Self-Feeding Abilities: Able to feed self Baseline Vocal Quality: Clear Volitional Cough: Strong Volitional Swallow: Able to elicit    Oral/Motor/Sensory Function Overall Oral Motor/Sensory Function: Appears within functional limits for tasks assessed   Ice Chips Ice chips: Not tested   Thin Liquid Thin Liquid: Within functional limits Presentation: Self Fed;Straw    Nectar Thick Nectar Thick Liquid: Not tested   Honey Thick Honey Thick Liquid: Not tested   Puree Puree: Within functional limits Presentation: Self Fed;Spoon   Solid   GO    Solid: Impaired Other Comments: slow mastication but functional- subtle cough x1 with beef, no further episodes       [name], MS [facility] SLP [phone]

## 2012-09-10 NOTE — Progress Notes (Signed)
INITIAL NUTRITION ASSESSMENT  DOCUMENTATION CODES Per approved criteria  -Not Applicable   INTERVENTION: Provide Ensure Complete once daily Encourage PO intake Provide Multivitamin with minerals daily  NUTRITION DIAGNOSIS: Inadequate oral intake related to poor appetite as evidenced by pt's report.   Goal: Pt to meet >/= 90% of their estimated nutrition needs  Monitor:  PO intake Weight Labs  Reason for Assessment: Consult  79 y.o. female  Admitting Dx: Hip Fracture  ASSESSMENT: 79yo female admitted after ground level fall. Immediate onset pain inability to bear weight, obvious deformity. Pt underwent right hip hemiarthroplasty yesterday evening.  Pt reports that she usually eats 3 meals daily but, she has a poor appetite and doesn't eat much so she also drinks one Ensure supplement daily. Pt states she used to weigh 114 lbs but, recently she has been maintaining her weight around 111-112 lbs. Pt denies any dysphagia and does not follow any diet. Encouraged pt to eat 3 meals daily and eat protein-rich foods daily; reviewed protein-rich foods.   Height: Ht Readings from Last 1 Encounters:  09/08/12 5\' 1"  (1.549 m)    Weight: Wt Readings from Last 1 Encounters:  09/08/12 111 lb 15.9 oz (50.8 kg)    Ideal Body Weight: 105 lbs  % Ideal Body Weight: 106%  Wt Readings from Last 10 Encounters:  09/08/12 111 lb 15.9 oz (50.8 kg)  09/08/12 111 lb 15.9 oz (50.8 kg)  08/08/12 112 lb (50.803 kg)  07/16/12 112 lb 12.8 oz (51.166 kg)  05/27/12 114 lb (51.71 kg)  03/19/12 97 lb 1.6 oz (44.044 kg)  03/12/12 96 lb 12.8 oz (43.908 kg)  01/16/12 99 lb (44.906 kg)  09/18/11 98 lb 12.8 oz (44.815 kg)  07/25/11 98 lb 3.2 oz (44.543 kg)    Usual Body Weight: 112 lbs  % Usual Body Weight: 99%  BMI:  Body mass index is 21.17 kg/(m^2).  Estimated Nutritional Needs: Kcal: 1300-1500 Protein: 76-86 grams Fluid: 1.5 L  Skin: right hip incision  Diet Order: Full  Liquid  EDUCATION NEEDS: -No education needs identified at this time   Intake/Output Summary (Last 24 hours) at 09/10/12 1114 Last data filed at 09/10/12 0900  Gross per 24 hour  Intake   3365 ml  Output   1805 ml  Net   1560 ml    Last BM: 7/12   Labs:   Recent Labs Lab 09/08/12 0925 09/09/12 0405 09/10/12 0408  NA 136 136 135  K 3.8 3.9 4.5  CL 99 103 103  CO2 27 25 26   BUN 19 14 10   CREATININE 0.74 0.73 0.72  CALCIUM 9.6 8.4 8.4  GLUCOSE 135* 154* 133*    CBG (last 3)  No results found for this basename: GLUCAP,  in the last 72 hours  Scheduled Meds: . aspirin EC  81 mg Oral q morning - 10a  . atorvastatin  20 mg Oral QPM  . cholecalciferol  1,000 Units Oral q morning - 10a  . docusate sodium  100 mg Oral BID  . feeding supplement  1 Container Oral TID BM  . ferrous sulfate  325 mg Oral TID PC  . hydroxyurea  1,000 mg Oral Custom  . hydroxyurea  500 mg Oral Custom  . memantine  5 mg Oral BID  . metoprolol tartrate  25 mg Oral BID  . mometasone-formoterol  2 puff Inhalation BID  . montelukast  10 mg Oral QHS  . pantoprazole  40 mg Oral Daily  . sertraline  25 mg Oral QPM  . warfarin  1.5 mg Oral ONCE-1800  . Warfarin - Pharmacist Dosing Inpatient   Does not apply q1800    Continuous Infusions: . dextrose 5 % and 0.45 % NaCl with KCl 20 mEq/L 75 mL/hr at 09/10/12 1011    Past Medical History  Diagnosis Date  . Macular degeneration   . COPD (chronic obstructive pulmonary disease)   . Hypertension   . Coronary artery disease   . Hypercholesterolemia   . GERD (gastroesophageal reflux disease)   . Asthmatic bronchitis   . History of colonic polyps   . DJD (degenerative joint disease)   . Lumbar back pain   . Osteoporosis   . History of transient ischemic attack (TIA)   . Anxiety   . Essential thrombocythemia   . Diverticulosis 05/10/2012  . Osteoarthrosis, unspecified whether generalized or localized, unspecified site 05/09/2012  . Memory loss  05/09/2012  . Abnormal weight gain 05/09/2012  . Phlebitis and thrombophlebitis of other deep vessels of lower extremities 07/10/2010  . Essential thrombocythemia 06/09/2009    Past Surgical History  Procedure Laterality Date  . Appendectomy    . Cholecystectomy    . Tah and bso  08/21/1995  . Cataract extraction w/ intraocular lens  implant, bilateral    . Abdominal hysterectomy      Ian Malkin RD, LDN Inpatient Clinical Dietitian Pager: [phone] After Hours Pager: [phone]

## 2012-09-10 NOTE — Progress Notes (Signed)
Sherri Crawford:[id number] DOB: 1920/04/22 DOA: 09/08/2012 PCP: Michele Mcalpine, MD  Assessment/Plan: 1- Hip fracture Patient S/P Right hip hemiarthroplasty 7-14.  Coumadin for DVT prophylaxis.   2. COPD with asthma  - stable, continue home regimen, Advair, Singulair,  -Albuterol PRN.   3. H/o DVT  -On chronic anticoagulation.  -Continue with Coumadin.   4. HTN  --Continue with metoprolol.   5-CAD: continue with aspirin, Lipitor. Patient denies chest pain. EKG sinus rhythm, no st changes.  6-Esensitial Thrombocythemia: Continue with Hydroxyurea. Platelet at 198. 7-Anemia: Last Hb at 11 on May 2014. Follow trend. Hb today at 9.6.    Code Status: Full Family Communication: Care discussed with patient and daughter who was at bedside.  Disposition Plan: Likely SNF   Consultants:  Dr Charlann Boxer.   Procedures:  none  Antibiotics:  none  HPI/Subjective: Feeling well, denies chest pain or dyspnea.  Relates mild back and hip pain.   Objective: Filed Vitals:   09/10/12 1200 09/10/12 1211 09/10/12 1507 09/10/12 1646  BP:  100/39  101/35  Pulse:  66  73  Temp:  98.4 F (36.9 C)  98.7 F (37.1 C)  TempSrc:  Oral  Oral  Resp: 16 16 16 18   Height:      Weight:      SpO2:  100% 100% 100%    Intake/Output Summary (Last 24 hours) at 09/10/12 1721 Last data filed at 09/10/12 1647  Gross per 24 hour  Intake   2160 ml  Output   1630 ml  Net    530 ml   Filed Weights   09/08/12 1229  Weight: 50.8 kg (111 lb 15.9 oz)    Exam:   General:  No distress.   Cardiovascular: S 1, S 2 RRR  Respiratory: CTA  Abdomen: BS present, soft, NT  Musculoskeletal: no edema.   Data Reviewed: Basic Metabolic Panel:  Recent Labs Lab 09/08/12 0925 09/09/12 0405 09/10/12 0408  NA 136 136 135  K 3.8 3.9 4.5  CL 99 103 103  CO2 27 25 26   GLUCOSE 135* 154* 133*  BUN 19 14 10   CREATININE 0.74 0.73 0.72  CALCIUM 9.6 8.4 8.4   Liver  Function Tests: No results found for this basename: AST, ALT, ALKPHOS, BILITOT, PROT, ALBUMIN,  in the last 168 hours No results found for this basename: LIPASE, AMYLASE,  in the last 168 hours No results found for this basename: AMMONIA,  in the last 168 hours CBC:  Recent Labs Lab 09/08/12 0925 09/09/12 0405 09/10/12 0408  WBC 11.1* 6.4 5.9  NEUTROABS 10.0*  --   --   HGB 13.1 10.5* 9.6*  HCT 39.4 32.6* 30.1*  MCV 97.3 97.6 100.0  PLT 240 198 199   Cardiac Enzymes: No results found for this basename: CKTOTAL, CKMB, CKMBINDEX, TROPONINI,  in the last 168 hours BNP (last 3 results) No results found for this basename: PROBNP,  in the last 8760 hours CBG: No results found for this basename: GLUCAP,  in the last 168 hours  Recent Results (from the past 240 hour(s))  SURGICAL PCR SCREEN     Status: None   Collection Time    09/08/12  6:48 PM      Result Value Range Status   MRSA, PCR NEGATIVE  NEGATIVE Final   Staphylococcus aureus NEGATIVE  NEGATIVE Final   Comment:            The Xpert SA Assay (FDA  approved for NASAL specimens     in patients over 51 years of age),     is one component of     a comprehensive surveillance     program.  Test performance has     been validated by [employer] for patients greater     than or equal to 100 year old.     It is not intended     to diagnose infection nor to     guide or monitor treatment.     Studies: Dg Pelvis Portable  09/09/2012   *RADIOLOGY REPORT*  Clinical Data: Post right total hip arthroplasty  PORTABLE PELVIS  Comparison: Portable exam 1730 hours compared to 09/08/2012  Findings: Right femoral prosthesis newly identified. No fracture, dislocation or bone destruction. Diffuse osseous demineralization. Scattered atherosclerotic calcifications.  IMPRESSION: Right femoral prosthesis without acute complication.   Original Report Authenticated By: Ulyses Southward, M.D.    Scheduled Meds: . aspirin EC  81 mg Oral q  morning - 10a  . atorvastatin  20 mg Oral QPM  . cholecalciferol  1,000 Units Oral q morning - 10a  . docusate sodium  100 mg Oral BID  . feeding supplement  237 mL Oral Q24H  . ferrous sulfate  325 mg Oral TID PC  . hydroxyurea  1,000 mg Oral Custom  . hydroxyurea  500 mg Oral Custom  . memantine  5 mg Oral BID  . metoprolol tartrate  25 mg Oral BID  . mometasone-formoterol  2 puff Inhalation BID  . montelukast  10 mg Oral QHS  . multivitamin with minerals  1 tablet Oral Daily  . pantoprazole  40 mg Oral Daily  . sertraline  25 mg Oral QPM  . Warfarin - Pharmacist Dosing Inpatient   Does not apply q1800   Continuous Infusions: . dextrose 5 % and 0.45 % NaCl with KCl 20 mEq/L 75 mL/hr at 09/10/12 1011    Active Problems:   Essential hypertension   COPD with asthma   BACK PAIN, LUMBAR   DVT (deep venous thrombosis)   Anxiety and depression   Hip fracture    Time spent: 25 minutes.     Yariel Ferraris  [facility] Pager [phone]. If 7PM-7AM, please contact night-coverage at [email or web address], password [facility] 09/10/2012, 5:21 PM  LOS: 2 days

## 2012-09-11 ENCOUNTER — Ambulatory Visit: Payer: Medicare Other | Admitting: Pharmacist Clinician (PhC)/ Clinical Pharmacy Specialist

## 2012-09-11 LAB — CBC
HCT: 25.2 % — ABNORMAL LOW (ref 36.0–46.0)
MCHC: 32.1 g/dL (ref 30.0–36.0)
Platelets: 194 10*3/uL (ref 150–400)
RDW: 16.4 % — ABNORMAL HIGH (ref 11.5–15.5)
WBC: 6.1 10*3/uL (ref 4.0–10.5)

## 2012-09-11 LAB — PROTIME-INR: Prothrombin Time: 25.2 seconds — ABNORMAL HIGH (ref 11.6–15.2)

## 2012-09-11 LAB — BASIC METABOLIC PANEL
BUN: 12 mg/dL (ref 6–23)
Chloride: 105 mEq/L (ref 96–112)
Creatinine, Ser: 0.74 mg/dL (ref 0.50–1.10)
GFR calc Af Amer: 85 mL/min — ABNORMAL LOW (ref >90)
GFR calc non Af Amer: 73 mL/min — ABNORMAL LOW (ref >90)
Potassium: 4.2 mEq/L (ref 3.5–5.1)

## 2012-09-11 MED ORDER — WARFARIN 0.5 MG HALF TABLET
1.5000 mg | ORAL_TABLET | Freq: Once | ORAL | Status: DC
  Filled 2012-09-11: qty 1

## 2012-09-11 MED ORDER — HYDROCODONE-ACETAMINOPHEN 5-325 MG PO TABS: 1.0000 | ORAL_TABLET | Freq: Four times a day (QID) | ORAL | Status: DC | PRN

## 2012-09-11 MED ORDER — FERROUS SULFATE 325 (65 FE) MG PO TABS: 325.0000 mg | ORAL_TABLET | Freq: Three times a day (TID) | ORAL | Status: DC

## 2012-09-11 NOTE — Progress Notes (Signed)
Report called and given to Rosey Bath, at [facility].

## 2012-09-11 NOTE — Progress Notes (Signed)
   Subjective: 2 Days Post-Op Procedure(s) (LRB): ARTHROPLASTY BIPOLAR HIP (Right)   Patient seen by Dr. Charlann Boxer. Patient reports pain as mild, pain well controlled. No events throughout the night.   Objective:   VITALS:   Filed Vitals:   09/11/12  BP: 135/46  Pulse: 73  Temp: 98.3 F (36.8 C)   Resp: 20    Incision: dressing C/D/I No cellulitis present Compartment soft  LABS  Recent Labs  09/09/12 0405 09/10/12 0408 09/11/12 0455  HGB 10.5* 9.6* 8.1*  HCT 32.6* 30.1* 25.2*  WBC 6.4 5.9 6.1  PLT 198 199 194     Recent Labs  09/09/12 0405 09/10/12 0408 09/11/12 0455  NA 136 135 136  K 3.9 4.5 4.2  BUN 14 10 12   CREATININE 0.73 0.72 0.74  GLUCOSE 154* 133* 132*     Assessment/Plan: 2 Days Post-Op Procedure(s) (LRB): ARTHROPLASTY BIPOLAR HIP (Right) Up with therapy, WBAT right leg Orthopaedically stable for discharge, when ready medically Coumadin per medicine / pharmacy for anticoagulation Hydrocodone for pain management, Rx written Follow up in 2 weeks at [facility]. Follow up with OLIN,Tylerjames Hoglund D in 2 weeks.  Contact information:  [facility] [address], [address] [place] [address] [phone]          Anastasio Auerbach. Lyndel Dancel   PAC  09/11/2012, 9:42 AM

## 2012-09-11 NOTE — Progress Notes (Signed)
Physical Therapy Treatment Patient Details Name: MURRY KHIEV MRN: [id number] DOB: 01/03/1921 Today's Date: 09/11/2012 Time: 7829-5621 PT Time Calculation (min): 28 min  PT Assessment / Plan / Recommendation  PT Comments   *Improved tolerance of activity today. Pt pivoted bed to 3 in 1, then to recliner with +2 assist and RW. Reviewed posterior hip precautions and assisted pt with RLE exercises. **  Follow Up Recommendations  SNF;Supervision/Assistance - 24 hour     Does the patient have the potential to tolerate intense rehabilitation     Barriers to Discharge        Equipment Recommendations  None recommended by PT    Recommendations for Other Services OT consult  Frequency Min 5X/week   Progress towards PT Goals Progress towards PT goals: Progressing toward goals  Plan Current plan remains appropriate    Precautions / Restrictions Precautions Precautions: Fall;Posterior Hip Precaution Comments: reviewed posterior hip precautions with pt, sign in room Restrictions Weight Bearing Restrictions: No Other Position/Activity Restrictions: WBAT   Pertinent Vitals/Pain **"premedicated Pt reported, "it just aches" -R hip.*    Mobility  Bed Mobility Supine to Sit: 1: +2 Total assist Supine to Sit: Patient Percentage: 20% Details for Bed Mobility Assistance: assist to advance BLEs and to raise trunk Transfers Transfers: Sit to Stand;Stand to Sit;Stand Pivot Transfers Sit to Stand: 1: +2 Total assist;From bed;With upper extremity assist Sit to Stand: Patient Percentage: 30% Stand to Sit: 1: +2 Total assist;To chair/3-in-1;With upper extremity assist Stand to Sit: Patient Percentage: 40% Stand Pivot Transfers: 1: +2 Total assist Stand Pivot Transfers: Patient Percentage: 40% Details for Transfer Assistance: assist to rise, VCs to push through BUEs with RW to pivot to 3 in 1, then to recliner Ambulation/Gait Ambulation/Gait Assistance: Not tested (comment)    Exercises  Total Joint Exercises Ankle Circles/Pumps: AROM;Both;15 reps Heel Slides: AAROM;Right;10 reps Hip ABduction/ADduction: AAROM;Right;10 reps   PT Diagnosis:    PT Problem List:   PT Treatment Interventions:     PT Goals (current goals can now be found in the care plan section) Acute Rehab PT Goals Patient Stated Goal: to get stronger PT Goal Formulation: With patient/family Time For Goal Achievement: 09/24/12 Potential to Achieve Goals: Good  Visit Information  Last PT Received On: 09/11/12 Assistance Needed: +2 History of Present Illness: 79 y.o. female admitted with R femoral neck fx, s/p bipolar hip arthroplasty.    Subjective Data  Patient Stated Goal: to get stronger   Cognition  Cognition Arousal/Alertness: Awake/alert Behavior During Therapy: WFL for tasks assessed/performed Overall Cognitive Status: Within Functional Limits for tasks assessed    Balance  Static Sitting Balance Static Sitting - Balance Support: Bilateral upper extremity supported;Feet supported Static Sitting - Level of Assistance: 5: Stand by assistance Static Sitting - Comment/# of Minutes: 3  End of Session PT - End of Session Equipment Utilized During Treatment: Gait belt Activity Tolerance: Patient limited by fatigue (limited by nausea/dizziness in sitting) Patient left: with call bell/phone within reach;with family/visitor present;in chair Nurse Communication: Mobility status   GP     Ralene Bathe Kistler 09/11/2012, 11:22 AM [phone]

## 2012-09-11 NOTE — Discharge Summary (Signed)
Physician Discharge Summary  Sherri Crawford ZOX:[id number] DOB: 09-Sep-1920 DOA: 09/08/2012  PCP: Michele Mcalpine, MD  Admit date: 09/08/2012 Discharge date: 09/11/2012  Time spent: Greater than 30 minutes  Recommendations for Outpatient Follow-up:  -Will be discharged to SNF today to continue rehab following her hip fracture.   Discharge Diagnoses:  Active Problems:   Essential hypertension   COPD with asthma   BACK PAIN, LUMBAR   DVT (deep venous thrombosis)   Anxiety and depression   Hip fracture   Discharge Condition: Stable and improved.  Filed Weights   09/08/12 1229  Weight: 50.8 kg (111 lb 15.9 oz)    History of present illness:  Patient is an 79 y.o. Woman with h/o copd, htn, h/o dvt on coumadin who presented to the ED after a mechanical fall on right side. This occurred early this AM. Patient has had pain at right hip which is worse with weight bearing and with palpation.  In ED patient had head ct which was negative. Xray of right hip reported as displaced right subcapital hip fx. We were contacted for admission recommendations for routine hip fx pathway. Ortho to manage.   Hospital Course:   - Hip fracture  Patient S/P Right hip hemiarthroplasty 7-14.  Coumadin for DVT prophylaxis.   2. COPD with asthma  - stable, continue home regimen, Advair, Singulair,  -Albuterol PRN.   3. H/o DVT  -On chronic anticoagulation.  -Continue with Coumadin.   4. HTN  --Continue with metoprolol.   5-CAD: continue with aspirin, Lipitor. Patient denies chest pain. EKG sinus rhythm, no st changes.   6-Esensitial Thrombocythemia: Continue with Hydroxyurea. Platelet at 198.   7-Anemia: Last Hb at 11 on May 2014. Stable      Procedures:  Hip repair   Consultations:  Ortho, Dr. Charlann Boxer  Discharge Instructions  Discharge Orders   Future Appointments Provider Department Dept Phone   11/19/2012 11:30 AM Krista Blue [facility] MEDICAL ONCOLOGY  [phone]   11/19/2012 12:00 PM Levert Feinstein, MD [facility] MEDICAL ONCOLOGY [phone]   02/06/2013 1:30 PM Kimber Relic, MD [facility] [phone]   Future Orders Complete By Expires     Call MD / Call 911  As directed     Comments:      If you experience chest pain or shortness of breath, CALL 911 and be transported to the hospital emergency room.  If you develope a fever above 101 F, pus (white drainage) or increased drainage or redness at the wound, or calf pain, call your surgeon's office.    Constipation Prevention  As directed     Comments:      Drink plenty of fluids.  Prune juice may be helpful.  You may use a stool softener, such as Colace (over the counter) 100 mg twice a day.  Use MiraLax (over the counter) for constipation as needed.    Diet - low sodium heart healthy  As directed     Discharge instructions  As directed     Comments:      Maintain surgical dressing for 10-14 days, then replace with gauze and tape. Keep the area dry and clean until follow up. Follow up in 2 weeks at [facility]. Call with any questions or concerns.    Discontinue IV  As directed     Increase activity slowly as tolerated  As directed     Increase activity slowly  As directed  Weight bearing as tolerated  As directed         Medication List    STOP taking these medications       Ensure Enlive Liqd     HYDROcodone-acetaminophen 10-325 MG per tablet  Commonly known as:  NORCO  Replaced by:  HYDROcodone-acetaminophen 5-325 MG per tablet      TAKE these medications       albuterol 108 (90 BASE) MCG/ACT inhaler  Commonly known as:  PROAIR HFA  Inhale 2 puffs into the lungs every 6 (six) hours as needed for wheezing. As needed for wheezing     aspirin EC 81 MG tablet  Take 81 mg by mouth every morning.     atorvastatin 20 MG tablet  Commonly known as:  LIPITOR  Take 20 mg by mouth every evening.     cholecalciferol 1000 UNITS  tablet  Commonly known as:  VITAMIN D  Take 1,000 Units by mouth every morning.     EPIPEN 2-PAK 0.3 mg/0.3 mL Soaj  Generic drug:  EPINEPHrine  Inject 0.3 mg into the skin once as needed (allergic reaction).     ferrous sulfate 325 (65 FE) MG tablet  Take 1 tablet (325 mg total) by mouth 3 (three) times daily after meals.     Fluticasone-Salmeterol 250-50 MCG/DOSE Aepb  Commonly known as:  ADVAIR  Inhale 1 puff into the lungs every 12 (twelve) hours.     HYDROcodone-acetaminophen 5-325 MG per tablet  Commonly known as:  NORCO/VICODIN  Take 1-2 tablets by mouth every 6 (six) hours as needed for pain.     hydroxyurea 500 MG capsule  Commonly known as:  HYDREA  Take 500 mg by mouth See admin instructions. Take 2 capsules on Monday. Take 1 capsule every other day of the week (Wed, Fri, Sun)     metoprolol tartrate 25 MG tablet  Commonly known as:  LOPRESSOR  Take 25 mg by mouth 2 (two) times daily.     montelukast 10 MG tablet  Commonly known as:  SINGULAIR  Take 10 mg by mouth at bedtime.     MULTIVITAMIN PO  Take 1 tablet by mouth every morning.     MACULAR VITAMIN BENEFIT PO  Take 1 tablet by mouth every morning.     NAMENDA XR 14 MG Cp24  Generic drug:  Memantine HCl ER  Take 14 mg by mouth every morning. Take one tablet daily for memory     nitroGLYCERIN 0.4 MG SL tablet  Commonly known as:  NITROSTAT  Place 0.4 mg under the tongue every 5 (five) minutes as needed for chest pain.     omeprazole 20 MG capsule  Commonly known as:  PRILOSEC  Take 20 mg by mouth every morning.     sertraline 25 MG tablet  Commonly known as:  ZOLOFT  Take 25 mg by mouth every evening.     warfarin 2.5 MG tablet  Commonly known as:  COUMADIN  Take 2.5 mg by mouth every evening.       Allergies  Allergen Reactions  . Sulfonamide Derivatives Other (See Comments)    Pt is unsure of reaction       Follow-up Information   Follow up with Shelda Pal, MD. Schedule an  appointment as soon as possible for a visit in 2 weeks.   Contact information:   [address] [place] [address] [phone]        The results of significant diagnostics from  this hospitalization (including imaging, microbiology, ancillary and laboratory) are listed below for reference.    Significant Diagnostic Studies: Dg Lumbar Spine Complete  09/08/2012   *RADIOLOGY REPORT*  Clinical Data: Fall with low back pain.  LUMBAR SPINE - COMPLETE 4+ VIEW  Comparison: None.  Findings: Diffuse spondylosis of the lumbar spine noted.  There is no evidence of acute fracture.  There is mild grade 1 retrolisthesis of L1 on L2.  The aorta and iliac arteries are heavily calcified.  IMPRESSION: Diffuse lumbar spondylosis.  No evidence of fracture.   Original Report Authenticated By: Irish Lack, M.D.   Dg Hip Complete Right  09/08/2012   *RADIOLOGY REPORT*  Clinical Data: Fall with right hip pain.  RIGHT HIP - COMPLETE 2+ VIEW  Comparison: None.  Findings: There is evidence of a displaced subcapital hip fracture on the right.  No dislocation is identified.  The bony pelvis is intact.  IMPRESSION: Displaced right subcapital hip fracture.   Original Report Authenticated By: Irish Lack, M.D.   Ct Head Wo Contrast  09/08/2012   *RADIOLOGY REPORT*  Clinical Data: Post fall.  On Coumadin.  CT HEAD WITHOUT CONTRAST  Technique:  Contiguous axial images were obtained from the base of the skull through the vertex without contrast.  Comparison: 07/19/2009.  Findings: No skull fracture or intracranial hemorrhage.  Small vessel disease type changes without CT evidence of large acute infarct.  No hydrocephalus.  Age related atrophy.  No intracranial mass lesion detected on this unenhanced exam.  Vascular calcifications.  Mastoid air cells, middle ear cavities and visualized sinuses are clear.  Orbital structures unremarkable.  IMPRESSION: No skull fracture or intracranial hemorrhage.  Small  vessel disease type changes without CT evidence of large acute infarct.   Original Report Authenticated By: Lacy Duverney, M.D.   Dg Pelvis Portable  09/09/2012   *RADIOLOGY REPORT*  Clinical Data: Post right total hip arthroplasty  PORTABLE PELVIS  Comparison: Portable exam 1730 hours compared to 09/08/2012  Findings: Right femoral prosthesis newly identified. No fracture, dislocation or bone destruction. Diffuse osseous demineralization. Scattered atherosclerotic calcifications.  IMPRESSION: Right femoral prosthesis without acute complication.   Original Report Authenticated By: Ulyses Southward, M.D.   Dg Chest Portable 1 View  09/08/2012   *RADIOLOGY REPORT*  Clinical Data: Preoperative respiratory exam for right hip fracture.  PORTABLE CHEST - 1 VIEW  Comparison: 07/25/2011  Findings: Mild scarring/atelectasis present at the right lung base. No edema, infiltrate or pleural effusion is seen.  Heart size is normal.  IMPRESSION: Mild scarring/atelectasis at the right lung base.  No active disease.   Original Report Authenticated By: Irish Lack, M.D.    Microbiology: Recent Results (from the past 240 hour(s))  SURGICAL PCR SCREEN     Status: None   Collection Time    09/08/12  6:48 PM      Result Value Range Status   MRSA, PCR NEGATIVE  NEGATIVE Final   Staphylococcus aureus NEGATIVE  NEGATIVE Final   Comment:            The Xpert SA Assay (FDA     approved for NASAL specimens     in patients over 42 years of age),     is one component of     a comprehensive surveillance     program.  Test performance has     been validated by [employer] for patients greater     than or equal to 67 year old.  It is not intended     to diagnose infection nor to     guide or monitor treatment.     Labs: Basic Metabolic Panel:  Recent Labs Lab 09/08/12 0925 09/09/12 0405 09/10/12 0408 09/11/12 0455  NA 136 136 135 136  K 3.8 3.9 4.5 4.2  CL 99 103 103 105  CO2 27 25 26 26   GLUCOSE 135*  154* 133* 132*  BUN 19 14 10 12   CREATININE 0.74 0.73 0.72 0.74  CALCIUM 9.6 8.4 8.4 8.4   Liver Function Tests: No results found for this basename: AST, ALT, ALKPHOS, BILITOT, PROT, ALBUMIN,  in the last 168 hours No results found for this basename: LIPASE, AMYLASE,  in the last 168 hours No results found for this basename: AMMONIA,  in the last 168 hours CBC:  Recent Labs Lab 09/08/12 0925 09/09/12 0405 09/10/12 0408 09/11/12 0455  WBC 11.1* 6.4 5.9 6.1  NEUTROABS 10.0*  --   --   --   HGB 13.1 10.5* 9.6* 8.1*  HCT 39.4 32.6* 30.1* 25.2*  MCV 97.3 97.6 100.0 99.2  PLT 240 198 199 194   Cardiac Enzymes: No results found for this basename: CKTOTAL, CKMB, CKMBINDEX, TROPONINI,  in the last 168 hours BNP: BNP (last 3 results) No results found for this basename: PROBNP,  in the last 8760 hours CBG: No results found for this basename: GLUCAP,  in the last 168 hours     Signed:  Chaya Jan  [facility] Pager: [phone] 09/11/2012, 10:28 AM

## 2012-09-11 NOTE — Progress Notes (Signed)
ANTICOAGULATION CONSULT NOTE - Follow up Consult  Pharmacy Consult for Warfarin Indication: Hx LLE DVT  Allergies  Allergen Reactions  . Sulfonamide Derivatives Other (See Comments)    Pt is unsure of reaction    Patient Measurements: Height: 5\' 1"  (154.9 cm) Weight: 111 lb 15.9 oz (50.8 kg) IBW/kg (Calculated) : 47.8  Vital Signs: Temp: 98.3 F (36.8 C) (07/16 0610) Temp src: Oral (07/16 0610) BP: 135/46 mmHg (07/16 0610) Pulse Rate: 73 (07/16 0610)  Labs:  Recent Labs  09/08/12 0925 09/08/12 0945 09/09/12 0405 09/09/12 1130 09/10/12 0408 09/11/12 0455  HGB 13.1  --  10.5*  --  9.6* 8.1*  HCT 39.4  --  32.6*  --  30.1* 25.2*  PLT 240  --  198  --  199 194  APTT  --  29  --   --   --   --   LABPROT 19.1*  --   --  20.6* 24.1* 25.2*  INR 1.66*  --   --  1.83* 2.24* 2.38*  CREATININE 0.74  --  0.73  --  0.72 0.74    Estimated Creatinine Clearance: 36 ml/min (by C-G formula based on Cr of 0.74).  Assessment: 79 yo F admit after ground level fall and displaced right femoral neck fracture. S/p right hemiarthropalsty on 7/14.  Pharmacy asked to assist with dosing warfarin resumed postop.  Home dose 2.5mg  daily - last dose taken 7/11, resumed on 7/14.  INR 2.38 today, therapeutic, but increasing on conservative dosing.  CBC:  H/H decreased, watch Hgb 8.1.  Plt stable/wnl post-op.  No bleeding or complications reported.  Goal of Therapy:  INR 2-3   Plan:   Warfarin 1.5 mg PO tonight at 1800  Conservative warfarin dosing d/t recent surgery.  Daily PT/INR    Lynann Beaver PharmD, BCPS Pager [phone] 09/11/2012 7:43 AM

## 2012-09-11 NOTE — Progress Notes (Signed)
Patient is set to discharge to [facility] today. [facility] [employer] authorization has been obtained. Patient & daughter at bedside aware. Discharge packet in [facility] & [facility] called for pickup (Service Request Id: [id number]).   Clinical Social Work Department CLINICAL SOCIAL WORK PLACEMENT NOTE 09/11/2012  Patient:  Sherri Crawford, Sherri Crawford  Account Number:  [id number] Admit date:  09/08/2012  Clinical Social Worker:  Orpah Greek  Date/time:  09/11/2012 11:37 AM  Clinical Social Work is seeking post-discharge placement for this patient at the following level of care:   SKILLED NURSING   (*CSW will update this form in Epic as items are completed)   09/11/2012  Patient/family provided with [facility] Department of Clinical Social Work's list of facilities offering this level of care within the geographic area requested by the patient (or if unable, by the patient's family).  09/11/2012  Patient/family informed of their freedom to choose among providers that offer the needed level of care, that participate in Medicare, Medicaid or managed care program needed by the patient, have an available bed and are willing to accept the patient.  09/11/2012  Patient/family informed of [facility]' ownership interest in [facility], as well as of the fact that they are under no obligation to receive care at this facility.  PASARR submitted to EDS on 09/11/2012 PASARR number received from EDS on 09/11/2012  FL2 transmitted to all facilities in geographic area requested by pt/family on  09/11/2012 FL2 transmitted to all facilities within larger geographic area on   Patient informed that his/her managed care company has contracts with or will negotiate with  certain facilities, including the following:     Patient/family informed of bed offers received:  09/11/2012 Patient chooses bed at [facility] Physician recommends and patient chooses bed at    Patient to be  transferred to [facility] [place] on  09/11/2012 Patient to be transferred to facility by [facility]  The following physician request were entered in Epic:   Additional Comments:   Unice Bailey, LCSW [facility] Clinical Social Worker cell #: [phone]

## 2012-09-13 ENCOUNTER — Telehealth: Payer: Self-pay | Admitting: Pharmacist

## 2012-09-13 NOTE — Telephone Encounter (Signed)
Called [facility] as we did not receive patients INR results via fax or call. Spoke to Rankin, RN who informed that the patient's lab for INR had not been drawn today and said that she would schedule an INR to be drawn on Monday next week on 09/16/12  The phone number for [facility] is [phone] x [phone]

## 2012-09-16 ENCOUNTER — Non-Acute Institutional Stay (SKILLED_NURSING_FACILITY): Payer: Medicare Other | Admitting: Nurse Practitioner

## 2012-09-16 ENCOUNTER — Encounter: Payer: Self-pay | Admitting: Nurse Practitioner

## 2012-09-16 DIAGNOSIS — D62 Acute posthemorrhagic anemia: Secondary | ICD-10-CM | POA: Insufficient documentation

## 2012-09-16 DIAGNOSIS — M545 Low back pain, unspecified: Secondary | ICD-10-CM

## 2012-09-16 DIAGNOSIS — F0280 Dementia in other diseases classified elsewhere without behavioral disturbance: Secondary | ICD-10-CM | POA: Insufficient documentation

## 2012-09-16 DIAGNOSIS — S72001S Fracture of unspecified part of neck of right femur, sequela: Secondary | ICD-10-CM

## 2012-09-16 DIAGNOSIS — F32A Depression, unspecified: Secondary | ICD-10-CM

## 2012-09-16 DIAGNOSIS — F0390 Unspecified dementia without behavioral disturbance: Secondary | ICD-10-CM

## 2012-09-16 DIAGNOSIS — F329 Major depressive disorder, single episode, unspecified: Secondary | ICD-10-CM

## 2012-09-16 DIAGNOSIS — J449 Chronic obstructive pulmonary disease, unspecified: Secondary | ICD-10-CM

## 2012-09-16 DIAGNOSIS — I2510 Atherosclerotic heart disease of native coronary artery without angina pectoris: Secondary | ICD-10-CM

## 2012-09-16 DIAGNOSIS — D473 Essential (hemorrhagic) thrombocythemia: Secondary | ICD-10-CM

## 2012-09-16 DIAGNOSIS — I82402 Acute embolism and thrombosis of unspecified deep veins of left lower extremity: Secondary | ICD-10-CM

## 2012-09-16 DIAGNOSIS — I82409 Acute embolism and thrombosis of unspecified deep veins of unspecified lower extremity: Secondary | ICD-10-CM

## 2012-09-16 DIAGNOSIS — S72009S Fracture of unspecified part of neck of unspecified femur, sequela: Secondary | ICD-10-CM

## 2012-09-16 DIAGNOSIS — I10 Essential (primary) hypertension: Secondary | ICD-10-CM

## 2012-09-16 DIAGNOSIS — F341 Dysthymic disorder: Secondary | ICD-10-CM

## 2012-09-16 DIAGNOSIS — K219 Gastro-esophageal reflux disease without esophagitis: Secondary | ICD-10-CM

## 2012-09-16 HISTORY — DX: Unspecified dementia, unspecified severity, without behavioral disturbance, psychotic disturbance, mood disturbance, and anxiety: F03.90

## 2012-09-16 NOTE — Assessment & Plan Note (Signed)
Takes Fe 325mg  tid, update CBC pending.

## 2012-09-16 NOTE — Assessment & Plan Note (Signed)
Controlled on Metoprolol 25mg bid.    

## 2012-09-16 NOTE — Assessment & Plan Note (Signed)
Stable on Omeprazole 20 mg daily.

## 2012-09-16 NOTE — Assessment & Plan Note (Signed)
Presently no angina--prn NTG, daily ASA 81mg, Atorvastatin 20mg for risk reduction.        

## 2012-09-16 NOTE — Assessment & Plan Note (Signed)
Hx of lower back pain, had X-ray of her lumbar spine 09/08/12--spondylosis-Hydrocodone/ApAp up to 10/325mg  ac and hs--still c/o back pain prior to am dose--will add Flexeril 5mg  qhs for now

## 2012-09-16 NOTE — Assessment & Plan Note (Signed)
Stable on Sertraline 25mg daily.    

## 2012-09-16 NOTE — Assessment & Plan Note (Signed)
Presently taking Hydroxyurea 500mg --II on Monday and I every other of the week.

## 2012-09-16 NOTE — Assessment & Plan Note (Signed)
Healing nicely, participates PT/OT, pain is mainly in her back even if with Hydrocodone/ApAp 10/325mg  qid. Eats meals in dinning room.

## 2012-09-16 NOTE — Assessment & Plan Note (Signed)
Presently Namenda XR 14mg  daily

## 2012-09-16 NOTE — Assessment & Plan Note (Addendum)
Managed with Fluticasone-Salmeterol 250/50 bid, Epipen prn, Montelukast 10mg  daily, and Albuterol II puffs q6hr prn.

## 2012-09-16 NOTE — Assessment & Plan Note (Addendum)
Switched to Xeralto since 09/12/12. Left   

## 2012-09-16 NOTE — Progress Notes (Signed)
Patient ID: Sherri Crawford, female   DOB: 08-Nov-1920, 79 y.o.   MRN: [id number] Code Status: Full Code  Allergies  Allergen Reactions  . Sulfonamide Derivatives Other (See Comments)    Pt is unsure of reaction    Chief Complaint  Patient presents with  . Medical Managment of Chronic Issues    lower back pain, s/p surgical repair of the right displaced subcpital hip fx    HPI: Patient is a 79 y.o. female seen in the SNF at [facility] today for evaluation of the right hip fx repair, lower back pain, and other chronic medical conditions. Hospitalized 09/08/12-09/11/12 for the right hip fx repair sustained from a fall in [place] @ [facility]. The patient has hx with h/o copd, htn, h/o dvt on coumadin-switched to [place]  who presented to the ED after a mechanical fall on right side. Patient had pain at right hip which is worse with weight bearing and with palpation when presented to ED.  In ED patient had head ct which was negative. Xray of right hip reported as displaced right subcapital hip fx. Has right hip hemiarthroplasty 09/09/12 and recuperated well.     Problem List Items Addressed This Visit   Acute blood loss anemia     Takes Fe 325mg  tid, update CBC pending.     Anxiety and depression     Stable on Sertraline 25mg  daily.     BACK PAIN, LUMBAR     Hx of lower back pain, had X-ray of her lumbar spine 09/08/12--spondylosis-Hydrocodone/ApAp up to 10/325mg  ac and hs--still c/o back pain prior to am dose--will add Flexeril 5mg  qhs for now    COPD with asthma     Managed with Fluticasone-Salmeterol 250/50 bid, Epipen prn, Montelukast 10mg  daily, and Albuterol II puffs q6hr prn.     CORONARY ARTERY DISEASE     Presently no angina--prn NTG, daily ASA 81mg , Atorvastatin 20mg  for risk reduction.     Deep vein thrombosis of left lower extremity   Dementia     Presently Namenda XR 14mg  daily    DVT (deep venous thrombosis)     Switched to [place] since 09/12/12. Left     Essential  hypertension     Controlled on Metoprolol 25mg  bid.     Essential thrombocythemia     Presently taking Hydroxyurea 500mg --II on Monday and I every other of the week.     GERD     Stable on Omeprazole 20mg  daily.     Hip fracture - Primary     Healing nicely, participates PT/OT, pain is mainly in her back even if with Hydrocodone/ApAp 10/325mg  qid. Eats meals in dinning room.        Review of Systems:  Review of Systems  Constitutional: Negative for fever, chills, weight loss, malaise/fatigue and diaphoresis.  HENT: Positive for hearing loss. Negative for ear pain, nosebleeds, congestion, sore throat, neck pain, tinnitus and ear discharge.   Eyes: Negative for blurred vision, double vision, photophobia, pain, discharge and redness.  Respiratory: Positive for cough and wheezing (occasional. ). Negative for hemoptysis, sputum production, shortness of breath and stridor.   Cardiovascular: Positive for leg swelling (trace RLE). Negative for chest pain, palpitations, orthopnea, claudication and PND.  Gastrointestinal: Negative for heartburn, nausea, vomiting, abdominal pain, diarrhea, constipation, blood in stool and melena.  Genitourinary: Positive for frequency. Negative for dysuria, urgency, hematuria and flank pain.  Musculoskeletal: Positive for back pain, joint pain (right hip) and falls (recent and resulted in the  right hip fx. ). Negative for myalgias.  Skin: Negative for itching and rash.  Neurological: Negative for dizziness, tingling, tremors, sensory change, speech change, focal weakness, seizures, loss of consciousness, weakness and headaches.  Endo/Heme/Allergies: Positive for environmental allergies. Negative for polydipsia. Bruises/bleeds easily.  Psychiatric/Behavioral: Positive for depression and memory loss. Negative for suicidal ideas, hallucinations and substance abuse. The patient is nervous/anxious. The patient does not have insomnia.      Past Medical History   Diagnosis Date  . Macular degeneration   . COPD (chronic obstructive pulmonary disease)   . Hypertension   . Coronary artery disease   . Hypercholesterolemia   . GERD (gastroesophageal reflux disease)   . Asthmatic bronchitis   . History of colonic polyps   . DJD (degenerative joint disease)   . Lumbar back pain   . Osteoporosis   . History of transient ischemic attack (TIA)   . Anxiety   . Essential thrombocythemia   . Diverticulosis 05/10/2012  . Osteoarthrosis, unspecified whether generalized or localized, unspecified site 05/09/2012  . Memory loss 05/09/2012  . Abnormal weight gain 05/09/2012  . Phlebitis and thrombophlebitis of other deep vessels of lower extremities 07/10/2010  . Essential thrombocythemia 06/09/2009   Past Surgical History  Procedure Laterality Date  . Appendectomy    . Cholecystectomy    . [place] and bso  08/21/1995  . Cataract extraction w/ intraocular lens  implant, bilateral    . Abdominal hysterectomy    . Hip arthroplasty Right 09/09/2012    Procedure: ARTHROPLASTY BIPOLAR HIP;  Surgeon: Shelda Pal, MD;  Location: [facility];  Service: Orthopedics;  Laterality: Right;   Social History:   reports that she has never smoked. She has never used smokeless tobacco. She reports that she does not drink alcohol or use illicit drugs.  Family History  Problem Relation Age of Onset  . Colon cancer Son 35  . Crohn's disease Son   . Cancer Maternal Aunt     Medications: Patient's Medications  New Prescriptions   No medications on file  Previous Medications   ALBUTEROL (PROAIR HFA) 108 (90 BASE) MCG/ACT INHALER    Inhale 2 puffs into the lungs every 6 (six) hours as needed for wheezing. As needed for wheezing   ASPIRIN EC 81 MG TABLET    Take 81 mg by mouth every morning.   ATORVASTATIN (LIPITOR) 20 MG TABLET    Take 20 mg by mouth every evening.    CHOLECALCIFEROL (VITAMIN D) 1000 UNITS TABLET    Take 1,000 Units by mouth every morning.   EPIPEN 2-PAK 0.3  MG/0.3ML DEVI    Inject 0.3 mg into the skin once as needed (allergic reaction).    FERROUS SULFATE 325 (65 FE) MG TABLET    Take 1 tablet (325 mg total) by mouth 3 (three) times daily after meals.   FLUTICASONE-SALMETEROL (ADVAIR) 250-50 MCG/DOSE AEPB    Inhale 1 puff into the lungs every 12 (twelve) hours.   HYDROCODONE-ACETAMINOPHEN (NORCO/VICODIN) 5-325 MG PER TABLET    Take 1-2 tablets by mouth every 6 (six) hours as needed for pain.   HYDROXYUREA (HYDREA) 500 MG CAPSULE    Take 500 mg by mouth See admin instructions. Take 2 capsules on Monday. Take 1 capsule every other day of the week (Wed, Fri, Sun)   MEMANTINE HCL ER (NAMENDA XR) 14 MG CP24    Take 14 mg by mouth every morning. Take one tablet daily for memory   METOPROLOL TARTRATE (LOPRESSOR) 25  MG TABLET    Take 25 mg by mouth 2 (two) times daily.   MONTELUKAST (SINGULAIR) 10 MG TABLET    Take 10 mg by mouth at bedtime.   MULTIPLE VITAMIN (MULTIVITAMIN PO)    Take 1 tablet by mouth every morning.    MULTIPLE VITAMINS-MINERALS (MACULAR VITAMIN BENEFIT PO)    Take 1 tablet by mouth every morning.    NITROGLYCERIN (NITROSTAT) 0.4 MG SL TABLET    Place 0.4 mg under the tongue every 5 (five) minutes as needed for chest pain.    OMEPRAZOLE (PRILOSEC) 20 MG CAPSULE    Take 20 mg by mouth every morning.    SERTRALINE (ZOLOFT) 25 MG TABLET    Take 25 mg by mouth every evening.   WARFARIN (COUMADIN) 2.5 MG TABLET    Take 2.5 mg by mouth every evening.  Modified Medications   No medications on file  Discontinued Medications   No medications on file     Physical Exam: Physical Exam  Constitutional: She is oriented to person, place, and time. She appears well-developed and well-nourished. No distress.  HENT:  Head: Normocephalic and atraumatic.  Eyes: Conjunctivae and EOM are normal. Pupils are equal, round, and reactive to light.  Neck: Normal range of motion. Neck supple. No JVD present. No thyromegaly present.  Cardiovascular: Normal  rate, regular rhythm and normal heart sounds.   Pulmonary/Chest: Effort normal. She has decreased breath sounds. She has no wheezes. She has no rales.  Abdominal: Soft. Bowel sounds are normal. There is no tenderness.  Musculoskeletal: Normal range of motion. She exhibits tenderness (chronic lower back and the right hip since the fx. ). She exhibits no edema.  Lymphadenopathy:    She has no cervical adenopathy.  Neurological: She is alert and oriented to person, place, and time. She has normal reflexes. No cranial nerve deficit. She exhibits normal muscle tone. Coordination normal.  Skin: Skin is warm and dry. No rash noted. She is not diaphoretic. No erythema (LLE).  The right hip surgical incision intact and no s/s cellulitis.   Psychiatric: Her mood appears not anxious. Her affect is not angry, not blunt, not labile and not inappropriate. Her speech is not delayed, not tangential and not slurred. She is slowed. She is not agitated, not aggressive, not withdrawn and not combative. Thought content is not paranoid and not delusional. Cognition and memory are impaired. She does not express impulsivity or inappropriate judgment. She exhibits a depressed mood. She exhibits abnormal recent memory.    Filed Vitals:   09/16/12 1317  BP: 146/66  Pulse: 80  Temp: 98.6 F (37 C)  TempSrc: Tympanic  Resp: 18      Labs reviewed: Basic Metabolic Panel:  Recent Labs  57/84/69 0405 09/10/12 0408 09/11/12 0455  NA 136 135 136  K 3.9 4.5 4.2  CL 103 103 105  CO2 25 26 26   GLUCOSE 154* 133* 132*  BUN 14 10 12   CREATININE 0.73 0.72 0.74  CALCIUM 8.4 8.4 8.4   Liver Function Tests:  Recent Labs  11/21/11 1330 07/16/12 1417  AST 20 23  ALT 16 17  ALKPHOS 46 72  BILITOT 0.50 0.32  PROT 6.4 6.6  ALBUMIN 3.8 3.2*   No results found for this basename: LIPASE, AMYLASE,  in the last 8760 hours No results found for this basename: AMMONIA,  in the last 8760 hours CBC:  Recent Labs   03/19/12 1558 07/16/12 1417  09/08/12 0925 09/09/12 0405 09/10/12 0408 09/11/12  0455  WBC 7.9 5.4  < > 11.1* 6.4 5.9 6.1  NEUTROABS 6.6* 3.4  --  10.0*  --   --   --   HGB 13.1 11.5*  < > 13.1 10.5* 9.6* 8.1*  HCT 38.5 34.2*  < > 39.4 32.6* 30.1* 25.2*  MCV 107.6* 97.6  < > 97.3 97.6 100.0 99.2  PLT 424* 345  < > 240 198 199 194  < > = values in this interval not displayed. Lipid Panel: No results found for this basename: CHOL, HDL, LDLCALC, TRIG, CHOLHDL, LDLDIRECT,  in the last 8760 hours Anemia Panel: No results found for this basename: FOLATE, IRON, VITAMINB12,  in the last 8760 hours  Past Procedures:  09/09/12 CT head w/o CM:  IMPRESSION: No skull fracture or intracranial hemorrhage.   Small vessel disease type changes without CT evidence of large acute infarct.  09/09/12 X-ray pelvix  IMPRESSION: Right femoral prosthesis without acute complication  09/08/12 x-ray L spine.   IMPRESSION: Diffuse lumbar spondylosis.  No evidence of fracture.  09/08/12 X-ray R hip   IMPRESSION: Displaced right subcapital hip fracture.     Assessment/Plan Hip fracture Healing nicely, participates PT/OT, pain is mainly in her back even if with Hydrocodone/ApAp 10/325mg  qid. Eats meals in dinning room.   BACK PAIN, LUMBAR Hx of lower back pain, had X-ray of her lumbar spine 09/08/12--spondylosis-Hydrocodone/ApAp up to 10/325mg  ac and hs--still c/o back pain prior to am dose--will add Flexeril 5mg  qhs for now  DVT (deep venous thrombosis) Switched to [place] since 09/12/12. Left   Acute blood loss anemia Takes Fe 325mg  tid, update CBC pending.   Essential thrombocythemia Presently taking Hydroxyurea 500mg --II on Monday and I every other of the week.   COPD with asthma Managed with Fluticasone-Salmeterol 250/50 bid, Epipen prn, Montelukast 10mg  daily, and Albuterol II puffs q6hr prn.   GERD Stable on Omeprazole 20mg  daily.   Anxiety and depression Stable on Sertraline 25mg   daily.   CORONARY ARTERY DISEASE Presently no angina--prn NTG, daily ASA 81mg , Atorvastatin 20mg  for risk reduction.   Essential hypertension Controlled on Metoprolol 25mg  bid.   Dementia Presently Namenda XR 14mg  daily    Family/ Staff Communication: observe the patient.   Goals of Care: [place]  Labs/tests ordered: pending BMP, Hgb A1c, CBC, Retic count.

## 2012-09-17 LAB — CBC AND DIFFERENTIAL
HCT: 25 % — AB (ref 36–46)
Hemoglobin: 8.4 g/dL — AB (ref 12.0–16.0)
Platelets: 430 10*3/uL — AB (ref 150–399)
WBC: 4.5 10^3/mL

## 2012-09-17 LAB — BASIC METABOLIC PANEL
BUN: 16 mg/dL (ref 4–21)
Creatinine: 0.7 mg/dL (ref 0.5–1.1)
Glucose: 99 mg/dL
Potassium: 4.2 mmol/L (ref 3.4–5.3)
Sodium: 139 mmol/L (ref 137–147)

## 2012-09-17 LAB — HEMOGLOBIN A1C: Hgb A1c MFr Bld: 5.4 % (ref 4.0–6.0)

## 2012-09-25 ENCOUNTER — Encounter: Payer: Self-pay | Admitting: Oncology

## 2012-10-16 ENCOUNTER — Encounter: Payer: Self-pay | Admitting: Nurse Practitioner

## 2012-10-16 ENCOUNTER — Non-Acute Institutional Stay (SKILLED_NURSING_FACILITY): Payer: Medicare Other | Admitting: Nurse Practitioner

## 2012-10-16 DIAGNOSIS — M545 Low back pain, unspecified: Secondary | ICD-10-CM

## 2012-10-16 DIAGNOSIS — I82402 Acute embolism and thrombosis of unspecified deep veins of left lower extremity: Secondary | ICD-10-CM

## 2012-10-16 DIAGNOSIS — I82409 Acute embolism and thrombosis of unspecified deep veins of unspecified lower extremity: Secondary | ICD-10-CM

## 2012-10-16 DIAGNOSIS — J449 Chronic obstructive pulmonary disease, unspecified: Secondary | ICD-10-CM

## 2012-10-16 DIAGNOSIS — F32A Depression, unspecified: Secondary | ICD-10-CM

## 2012-10-16 DIAGNOSIS — F329 Major depressive disorder, single episode, unspecified: Secondary | ICD-10-CM

## 2012-10-16 DIAGNOSIS — F341 Dysthymic disorder: Secondary | ICD-10-CM

## 2012-10-16 DIAGNOSIS — D62 Acute posthemorrhagic anemia: Secondary | ICD-10-CM

## 2012-10-16 DIAGNOSIS — I10 Essential (primary) hypertension: Secondary | ICD-10-CM

## 2012-10-16 DIAGNOSIS — K219 Gastro-esophageal reflux disease without esophagitis: Secondary | ICD-10-CM

## 2012-10-16 DIAGNOSIS — F0390 Unspecified dementia without behavioral disturbance: Secondary | ICD-10-CM

## 2012-10-16 NOTE — Assessment & Plan Note (Signed)
Switched to [place] since 09/12/12. Left   

## 2012-10-16 NOTE — Assessment & Plan Note (Signed)
Hx of lower back pain, had X-ray of her lumbar spine 09/08/12--spondylosis-better controlled with Hydrocodone/ApAp  10/325mg  ac and hs and Flexeril 5mg  qhs

## 2012-10-16 NOTE — Assessment & Plan Note (Signed)
Controlled on Metoprolol 25mg bid.    

## 2012-10-16 NOTE — Assessment & Plan Note (Signed)
Stable on Sertraline 25mg daily.    

## 2012-10-16 NOTE — Assessment & Plan Note (Addendum)
Presently Namenda XR 14mg  daily. Obtain TSH

## 2012-10-16 NOTE — Progress Notes (Signed)
Patient ID: Sherri Crawford, female   DOB: 1920-07-06, 79 y.o.   MRN: [id number] Code Status: Full Code  Allergies  Allergen Reactions  . Sulfonamide Derivatives Other (See Comments)    Pt is unsure of reaction    Chief Complaint  Patient presents with  . Medical Managment of Chronic Issues    HPI: Patient is a 79 y.o. female seen in the SNF at [facility] today for evaluation of  Anemia and other chronic medical conditions.  Problem List Items Addressed This Visit   Acute blood loss anemia - Primary     Takes Fe 325mg  tid, last Hgb 8.4 09/17/12, update CBC       Anxiety and depression     Stable on Sertraline 25mg  daily.       BACK PAIN, LUMBAR     Hx of lower back pain, had X-ray of her lumbar spine 09/08/12--spondylosis-better controlled with Hydrocodone/ApAp  10/325mg  ac and hs and Flexeril 5mg  qhs       COPD with asthma     Managed with Fluticasone-Salmeterol 250/50 bid, Epipen prn, Montelukast 10mg  daily, and Albuterol II puffs q6hr prn.       Dementia     Presently Namenda XR 14mg  daily. Obtain TSH      DVT (deep venous thrombosis)     Switched to [place] since 09/12/12. Left       Essential hypertension     Controlled on Metoprolol 25mg  bid.       GERD     Stable on Omeprazole 20mg  daily.          Review of Systems:  Review of Systems  Constitutional: Negative for fever, chills, weight loss, malaise/fatigue and diaphoresis.  HENT: Positive for hearing loss. Negative for ear pain, nosebleeds, congestion, sore throat, neck pain, tinnitus and ear discharge.   Eyes: Negative for blurred vision, double vision, photophobia, pain, discharge and redness.  Respiratory: Positive for cough and wheezing (occasional. ). Negative for hemoptysis, sputum production, shortness of breath and stridor.   Cardiovascular: Positive for leg swelling (trace RLE). Negative for chest pain, palpitations, orthopnea, claudication and PND.  Gastrointestinal: Negative  for heartburn, nausea, vomiting, abdominal pain, diarrhea, constipation, blood in stool and melena.  Genitourinary: Positive for frequency. Negative for dysuria, urgency, hematuria and flank pain.  Musculoskeletal: Positive for back pain, joint pain (right hip) and falls (recent and resulted in the right hip fx. ). Negative for myalgias.  Skin: Negative for itching and rash.  Neurological: Negative for dizziness, tingling, tremors, sensory change, speech change, focal weakness, seizures, loss of consciousness, weakness and headaches.  Endo/Heme/Allergies: Positive for environmental allergies. Negative for polydipsia. Bruises/bleeds easily.  Psychiatric/Behavioral: Positive for depression and memory loss. Negative for suicidal ideas, hallucinations and substance abuse. The patient is nervous/anxious. The patient does not have insomnia.      Past Medical History  Diagnosis Date  . Macular degeneration   . COPD (chronic obstructive pulmonary disease)   . Hypertension   . Coronary artery disease   . Hypercholesterolemia   . GERD (gastroesophageal reflux disease)   . Asthmatic bronchitis   . History of colonic polyps   . DJD (degenerative joint disease)   . Lumbar back pain   . Osteoporosis   . History of transient ischemic attack (TIA)   . Anxiety   . Essential thrombocythemia   . Diverticulosis 05/10/2012  . Osteoarthrosis, unspecified whether generalized or localized, unspecified site 05/09/2012  . Memory loss 05/09/2012  . Abnormal weight  gain 05/09/2012  . Phlebitis and thrombophlebitis of other deep vessels of lower extremities 07/10/2010  . Essential thrombocythemia 06/09/2009   Past Surgical History  Procedure Laterality Date  . Appendectomy    . Cholecystectomy    . [place] and bso  08/21/1995  . Cataract extraction w/ intraocular lens  implant, bilateral    . Abdominal hysterectomy    . Hip arthroplasty Right 09/09/2012    Procedure: ARTHROPLASTY BIPOLAR HIP;  Surgeon: Shelda Pal, MD;  Location: [facility];  Service: Orthopedics;  Laterality: Right;   Social History:   reports that she has never smoked. She has never used smokeless tobacco. She reports that she does not drink alcohol or use illicit drugs.  Family History  Problem Relation Age of Onset  . Colon cancer Son 67  . Crohn's disease Son   . Cancer Maternal Aunt     Medications: Patient's Medications  New Prescriptions   No medications on file  Previous Medications   ALBUTEROL (PROAIR HFA) 108 (90 BASE) MCG/ACT INHALER    Inhale 2 puffs into the lungs every 6 (six) hours as needed for wheezing. As needed for wheezing   ASPIRIN EC 81 MG TABLET    Take 81 mg by mouth every morning.   ATORVASTATIN (LIPITOR) 20 MG TABLET    Take 20 mg by mouth every evening.    CHOLECALCIFEROL (VITAMIN D) 1000 UNITS TABLET    Take 1,000 Units by mouth every morning.   EPIPEN 2-PAK 0.3 MG/0.3ML DEVI    Inject 0.3 mg into the skin once as needed (allergic reaction).    FERROUS SULFATE 325 (65 FE) MG TABLET    Take 1 tablet (325 mg total) by mouth 3 (three) times daily after meals.   FLUTICASONE-SALMETEROL (ADVAIR) 250-50 MCG/DOSE AEPB    Inhale 1 puff into the lungs every 12 (twelve) hours.   HYDROCODONE-ACETAMINOPHEN (NORCO/VICODIN) 5-325 MG PER TABLET    Take 1-2 tablets by mouth every 6 (six) hours as needed for pain.   HYDROXYUREA (HYDREA) 500 MG CAPSULE    Take 500 mg by mouth See admin instructions. Take 2 capsules on Monday. Take 1 capsule every other day of the week (Wed, Fri, Sun)   MEMANTINE HCL ER (NAMENDA XR) 14 MG CP24    Take 14 mg by mouth every morning. Take one tablet daily for memory   METOPROLOL TARTRATE (LOPRESSOR) 25 MG TABLET    Take 25 mg by mouth 2 (two) times daily.   MONTELUKAST (SINGULAIR) 10 MG TABLET    Take 10 mg by mouth at bedtime.   MULTIPLE VITAMIN (MULTIVITAMIN PO)    Take 1 tablet by mouth every morning.    MULTIPLE VITAMINS-MINERALS (MACULAR VITAMIN BENEFIT PO)    Take 1 tablet by mouth  every morning.    NITROGLYCERIN (NITROSTAT) 0.4 MG SL TABLET    Place 0.4 mg under the tongue every 5 (five) minutes as needed for chest pain.    OMEPRAZOLE (PRILOSEC) 20 MG CAPSULE    Take 20 mg by mouth every morning.    SERTRALINE (ZOLOFT) 25 MG TABLET    Take 25 mg by mouth every evening.   WARFARIN (COUMADIN) 2.5 MG TABLET    Take 2.5 mg by mouth every evening.  Modified Medications   No medications on file  Discontinued Medications   No medications on file     Physical Exam: Physical Exam  Constitutional: She is oriented to person, place, and time. She appears well-developed and well-nourished.  No distress.  HENT:  Head: Normocephalic and atraumatic.  Eyes: Conjunctivae and EOM are normal. Pupils are equal, round, and reactive to light.  Neck: Normal range of motion. Neck supple. No JVD present. No thyromegaly present.  Cardiovascular: Normal rate, regular rhythm and normal heart sounds.   Pulmonary/Chest: Effort normal. She has decreased breath sounds. She has no wheezes. She has no rales.  Abdominal: Soft. Bowel sounds are normal. There is no tenderness.  Musculoskeletal: Normal range of motion. She exhibits tenderness (chronic lower back and the right hip since the fx. ). She exhibits no edema.  Lymphadenopathy:    She has no cervical adenopathy.  Neurological: She is alert and oriented to person, place, and time. She has normal reflexes. No cranial nerve deficit. She exhibits normal muscle tone. Coordination normal.  Skin: Skin is warm and dry. No rash noted. She is not diaphoretic. No erythema (LLE).  The right hip surgical incision intact and no s/s cellulitis.   Psychiatric: Her mood appears not anxious. Her affect is not angry, not blunt, not labile and not inappropriate. Her speech is not delayed, not tangential and not slurred. She is slowed. She is not agitated, not aggressive, not withdrawn and not combative. Thought content is not paranoid and not delusional. Cognition  and memory are impaired. She does not express impulsivity or inappropriate judgment. She exhibits a depressed mood. She exhibits abnormal recent memory.    Filed Vitals:   10/16/12 1243  BP: 133/71  Pulse: 92  Temp: 97.6 F (36.4 C)  TempSrc: Tympanic  Resp: 18      Labs reviewed: Basic Metabolic Panel:  Recent Labs  59/56/38 0405 09/10/12 0408 09/11/12 0455 09/17/12  NA 136 135 136 139  K 3.9 4.5 4.2 4.2  CL 103 103 105  --   CO2 25 26 26   --   GLUCOSE 154* 133* 132*  --   BUN 14 10 12 16   CREATININE 0.73 0.72 0.74 0.7  CALCIUM 8.4 8.4 8.4  --    Liver Function Tests:  Recent Labs  11/21/11 1330 07/16/12 1417  AST 20 23  ALT 16 17  ALKPHOS 46 72  BILITOT 0.50 0.32  PROT 6.4 6.6  ALBUMIN 3.8 3.2*   No results found for this basename: LIPASE, AMYLASE,  in the last 8760 hours No results found for this basename: AMMONIA,  in the last 8760 hours CBC:  Recent Labs  03/19/12 1558 07/16/12 1417  09/08/12 0925 09/09/12 0405 09/10/12 0408 09/11/12 0455 09/17/12  WBC 7.9 5.4  < > 11.1* 6.4 5.9 6.1 4.5  NEUTROABS 6.6* 3.4  --  10.0*  --   --   --   --   HGB 13.1 11.5*  < > 13.1 10.5* 9.6* 8.1* 8.4*  HCT 38.5 34.2*  < > 39.4 32.6* 30.1* 25.2* 25*  MCV 107.6* 97.6  < > 97.3 97.6 100.0 99.2  --   PLT 424* 345  < > 240 198 199 194 430*  < > = values in this interval not displayed. Lipid Panel: No results found for this basename: CHOL, HDL, LDLCALC, TRIG, CHOLHDL, LDLDIRECT,  in the last 8760 hours  Past Procedures:     Assessment/Plan Acute blood loss anemia Takes Fe 325mg  tid, last Hgb 8.4 09/17/12, update CBC     Anxiety and depression Stable on Sertraline 25mg  daily.     BACK PAIN, LUMBAR Hx of lower back pain, had X-ray of her lumbar spine 09/08/12--spondylosis-better controlled with Hydrocodone/ApAp  10/325mg  ac and hs and Flexeril 5mg  qhs     Dementia Presently Namenda XR 14mg  daily. Obtain TSH    DVT (deep venous thrombosis) Switched  to [place] since 09/12/12. Left     Essential hypertension Controlled on Metoprolol 25mg  bid.     COPD with asthma Managed with Fluticasone-Salmeterol 250/50 bid, Epipen prn, Montelukast 10mg  daily, and Albuterol II puffs q6hr prn.     GERD Stable on Omeprazole 20mg  daily.       Family/ Staff Communication: observe the patient  Goals of Care: SNF  Labs/tests ordered: CBC and TSH

## 2012-10-16 NOTE — Assessment & Plan Note (Signed)
Managed with Fluticasone-Salmeterol 250/50 bid, Epipen prn, Montelukast 10mg daily, and Albuterol II puffs q6hr prn.  

## 2012-10-16 NOTE — Assessment & Plan Note (Signed)
Stable on Omeprazole 20 mg daily.

## 2012-10-16 NOTE — Assessment & Plan Note (Signed)
Takes Fe 325mg  tid, last Hgb 8.4 09/17/12, update CBC

## 2012-10-24 NOTE — Patient Instructions (Signed)
Continue current medications. 

## 2012-10-24 NOTE — Progress Notes (Signed)
Subjective:    Patient ID: Sherri Crawford, female    DOB: 27-Apr-1920, 79 y.o.   MRN: [id number]  HPI Patient presents as a new patient in the [facility] clinic today.   She moved to assisted living at [facility] 04/09/12. Her husband currently resides in the [facility] skilled nursing facility at [facility].  She was recently in skilled care following a hip fracture and repair. Hospitalized 09/08/12-09/11/12 for the right hip fx repair sustained a mechanical fall on the right side in AL @ [facility]. In ED patient had head ct which was negative. Xray of right hip reported as displaced right subcapital hip fx. Has right hip hemiarthroplasty 09/09/12 and recuperated well.  Patient has chronic issues with blood pressure, anxiety, and failing memory.   She is currently on Namenda XR  14 mg daily for her dementia  She has essential thrombocythemia. She takes hydroxyurea 500 mg: 2 tablets on Mondays and 1 tablet every other day of the week.  She has had a deep vein thrombosis of the left leg. She is currently taking Xarelto since 09/12/12.  She has had back pains with epidural shots administered on 3 occasions by Dr. Ethelene Hal. The shots did help.  She had an EGD 06/16/2010 showed presbyesophagus.  She has had a colonoscopy 05/31/2006. This one did not have any polyps however a colonoscopy April 2002 did have a colon polyp that was removed.  Patient had a syncopal episode 2011, but that has not recurred.  Prior history also discloses coronary artery disease, peripheral edema which is stable, and COPD.  Current Outpatient Prescriptions on File Prior to Visit  Medication Sig Dispense Refill  . albuterol (PROAIR HFA) 108 (90 BASE) MCG/ACT inhaler Inhale 2 puffs into the lungs every 6 (six) hours as needed for wheezing. As needed for wheezing  1 Inhaler  11  . atorvastatin (LIPITOR) 20 MG tablet Take 20 mg by mouth every evening.       Marland Kitchen EPIPEN 2-PAK 0.3 MG/0.3ML DEVI Inject  0.3 mg into the skin once as needed (allergic reaction).       . Fluticasone-Salmeterol (ADVAIR) 250-50 MCG/DOSE AEPB Inhale 1 puff into the lungs every 12 (twelve) hours.      . hydroxyurea (HYDREA) 500 MG capsule Take 500 mg by mouth See admin instructions. Take 2 capsules on Monday. Take 1 capsule every other day of the week (Wed, Fri, Sun)      . Multiple Vitamin (MULTIVITAMIN PO) Take 1 tablet by mouth every morning.       . Multiple Vitamins-Minerals (MACULAR VITAMIN BENEFIT PO) Take 1 tablet by mouth every morning.       . nitroGLYCERIN (NITROSTAT) 0.4 MG SL tablet Place 0.4 mg under the tongue every 5 (five) minutes as needed for chest pain.       Marland Kitchen omeprazole (PRILOSEC) 20 MG capsule Take 20 mg by mouth every morning.        No current facility-administered medications on file prior to visit.   Immunization History  Administered Date(s) Administered  . Influenza Split 11/16/2010, 12/13/2011  . Influenza Whole 11/27/2009  . Pneumococcal Polysaccharide 02/28/1988   Active Ambulatory Problems    Diagnosis Date Noted  . COLONIC POLYPS 03/12/2007  . Essential thrombocythemia 07/30/2009  . HYPERCHOLESTEROLEMIA 03/12/2007  . MACULAR DEGENERATION 01/26/2008  . Essential hypertension 03/12/2007  . CORONARY ARTERY DISEASE 03/31/2007  . 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 GERD 03/12/2007  . DEGENERATIVE JOINT DISEASE 03/12/2007  .  BACK PAIN, LUMBAR 03/12/2007  . OSTEOPOROSIS 03/12/2007  . SYNCOPE 07/30/2009  . TRANSIENT ISCHEMIC ATTACKS, HX OF 03/12/2007  . Dysphagia, unspecified(787.20) 05/27/2010  . Edema of leg 07/01/2010  . Left leg DVT 07/01/2010  . DVT (deep venous thrombosis) 07/13/2010  . Anxiety and depression 03/12/2012  . Deep vein thrombosis of left lower extremity 07/19/2012  . Hip fracture 09/08/2012  . Acute blood loss anemia 09/16/2012  . Dementia 09/16/2012  . Presbyesophagus 10/25/2010   Resolved Ambulatory Problems    Diagnosis Date Noted  . ASTHMATIC  BRONCHITIS, ACUTE 03/12/2007  . Cellulitis of leg, left 05/31/2012   Past Medical History  Diagnosis Date  . Macular degeneration   . COPD (chronic obstructive pulmonary disease)   . Hypertension   . Coronary artery disease   . Hypercholesterolemia   . GERD (gastroesophageal reflux disease)   . Asthmatic bronchitis   . History of colonic polyps   . DJD (degenerative joint disease)   . Lumbar back pain   . Osteoporosis   . History of transient ischemic attack (TIA)   . Anxiety   . Diverticulosis 05/10/2012  . Memory loss 05/09/2012  . Abnormal weight gain 05/09/2012  . Phlebitis and thrombophlebitis of other deep vessels of lower extremities 07/10/2010   Past Surgical History  Procedure Laterality Date  . Appendectomy    . Cholecystectomy    . Tah and bso  08/21/1995  . Cataract extraction w/ intraocular lens  implant, bilateral    . Abdominal hysterectomy    . Hip arthroplasty Right 09/09/2012    Procedure: ARTHROPLASTY BIPOLAR HIP;  Surgeon: Shelda Pal, MD;  Location: [facility];  Service: Orthopedics;  Laterality: Right;   Family Status  Relation Status Death Age  . Son Alive   . Son Alive   . Father      aneurysm  . Mother      stroke   Family History  Problem Relation Age of Onset  . Colon cancer Son 43  . Crohn's disease Son   . Cancer Maternal Aunt      Review of Systems  Constitutional: Negative.  Negative for fatigue and unexpected weight change.  HENT: Positive for hearing loss. Negative for ear pain.   Eyes: Negative.   Respiratory: Negative.  Negative for cough.   Cardiovascular: Positive for leg swelling. Negative for chest pain and palpitations.  Gastrointestinal: Negative.   Endocrine: Negative.   Genitourinary: Positive for frequency. Negative for dysuria, urgency, hematuria and flank pain.  Musculoskeletal: Positive for back pain.       Pain in the right hip. History of falls. Denies muscular pains. No acutely inflamed joints.  Skin: Negative.    Allergic/Immunologic: Positive for environmental allergies. Negative for food allergies and immunocompromised state.  Neurological:       History mild memory deficits.  Hematological: Bruises/bleeds easily.  Psychiatric/Behavioral: Negative for sleep disturbance.       History of depression and anxiety. No hallucinations. Not a behavioral problem.       Objective:BP 170/82  Pulse 62  Temp(Src) 98 F (36.7 C) (Oral)  Ht 5' (1.524 m)  Wt 112 lb (50.803 kg)  BMI 21.87 kg/m2    Physical Exam  Constitutional: She is oriented to person, place, and time. She appears well-developed and well-nourished. No distress.  HENT:  Head: Normocephalic and atraumatic.  Right Ear: External ear normal.  Left Ear: External ear normal.  Nose: Nose normal.  Mouth/Throat: Oropharynx is clear and moist.  Eyes:  Conjunctivae and EOM are normal. Pupils are equal, round, and reactive to light.  Neck: Neck supple. No JVD present. No tracheal deviation present. No thyromegaly present.  Cardiovascular: Normal rate, regular rhythm, normal heart sounds and intact distal pulses.  Exam reveals no gallop and no friction rub.   No murmur heard. Pulmonary/Chest: Breath sounds normal. No respiratory distress. She has no wheezes. She has no rales. She exhibits no tenderness.  Abdominal: She exhibits no distension and no mass. There is no tenderness.  Musculoskeletal: Normal range of motion. She exhibits edema. She exhibits no tenderness.  Tender in lower back. Tender in the right hip. Well-healed scar from the previous hip fracture and surgery.  Lymphadenopathy:    She has no cervical adenopathy.  Neurological: She is alert and oriented to person, place, and time. No cranial nerve deficit. Coordination normal.  Skin: No rash noted. No erythema. No pallor.  Psychiatric: She has a normal mood and affect. Her behavior is normal. Thought content normal.          Assessment & Plan:  Essential hypertension: Systolic  blood pressure at today's visit. Generally it is under good control. I would attribute the elevated SBP to initial meeting in the clinic today.  Dementia: Progressing slowly. Most likely cause is Alzheimer's disease.  Dysphagia, unspecified: Likely related to presbyesophagus. Patient can swallow most items without difficulty. She occasionally has a sense that food hangs in the chest.  Presbyesophagus: Most likely cause of her symptoms of dysphagia.  Essential thrombocythemia: Followed by Dr. Cyndie Chime. Appears to be under control.  COLONIC POLYPS: Remote history. I would not suggest another colonoscopy unless she starts bleeding.  Left leg DVT: Taking Xarelto. No acute findings.  HYPERCHOLESTEROLEMIA: Controlled  BACK PAIN, LUMBAR: Under the care of Dr. Ethelene Hal. She has chronic discomfort in the back.  SYNCOPE: No further episodes since 2011.

## 2012-10-24 NOTE — Assessment & Plan Note (Signed)
Initially treated with warfarin. Switched to Xarelto July 2014.

## 2012-10-30 ENCOUNTER — Other Ambulatory Visit: Payer: Self-pay

## 2012-10-30 ENCOUNTER — Ambulatory Visit: Payer: Medicare Other | Admitting: Pharmacist Clinician (PhC)/ Clinical Pharmacy Specialist

## 2012-10-30 MED ORDER — MEMANTINE HCL 5 MG PO TABS: 5.0000 mg | ORAL_TABLET | Freq: Two times a day (BID) | ORAL | Status: DC

## 2012-10-30 NOTE — Telephone Encounter (Signed)
Letter received indicating that Namenda XR 14 mg is no longer available. Per Dr.Pandey change to Namenda 5 mg twice daily

## 2012-11-04 ENCOUNTER — Encounter: Payer: Self-pay | Admitting: Nurse Practitioner

## 2012-11-04 ENCOUNTER — Non-Acute Institutional Stay (SKILLED_NURSING_FACILITY): Payer: Medicare Other | Admitting: Nurse Practitioner

## 2012-11-04 DIAGNOSIS — J449 Chronic obstructive pulmonary disease, unspecified: Secondary | ICD-10-CM

## 2012-11-04 DIAGNOSIS — I2510 Atherosclerotic heart disease of native coronary artery without angina pectoris: Secondary | ICD-10-CM

## 2012-11-04 DIAGNOSIS — M545 Low back pain, unspecified: Secondary | ICD-10-CM

## 2012-11-04 DIAGNOSIS — K219 Gastro-esophageal reflux disease without esophagitis: Secondary | ICD-10-CM

## 2012-11-04 DIAGNOSIS — F32A Depression, unspecified: Secondary | ICD-10-CM

## 2012-11-04 DIAGNOSIS — D473 Essential (hemorrhagic) thrombocythemia: Secondary | ICD-10-CM

## 2012-11-04 DIAGNOSIS — J4489 Other specified chronic obstructive pulmonary disease: Secondary | ICD-10-CM

## 2012-11-04 DIAGNOSIS — D62 Acute posthemorrhagic anemia: Secondary | ICD-10-CM

## 2012-11-04 DIAGNOSIS — F0390 Unspecified dementia without behavioral disturbance: Secondary | ICD-10-CM

## 2012-11-04 DIAGNOSIS — F341 Dysthymic disorder: Secondary | ICD-10-CM

## 2012-11-04 DIAGNOSIS — F329 Major depressive disorder, single episode, unspecified: Secondary | ICD-10-CM

## 2012-11-04 DIAGNOSIS — I10 Essential (primary) hypertension: Secondary | ICD-10-CM

## 2012-11-04 NOTE — Assessment & Plan Note (Signed)
Managed with Fluticasone-Salmeterol 250/50 bid, Epipen prn, Montelukast 10mg daily, and Albuterol II puffs q6hr prn.  

## 2012-11-04 NOTE — Assessment & Plan Note (Signed)
Switched to [place] since 09/12/12. Left   

## 2012-11-04 NOTE — Assessment & Plan Note (Signed)
Stable on Sertraline 25mg daily.    

## 2012-11-04 NOTE — Progress Notes (Signed)
Patient ID: Sherri Crawford, female   DOB: Apr 03, 1920, 79 y.o.   MRN: [id number] Code Status: Full Code.   Allergies  Allergen Reactions  . Sulfonamide Derivatives Other (See Comments)    Pt is unsure of reaction    Chief Complaint  Patient presents with  . Medical Managment of Chronic Issues    discharge to AL at [facility]    HPI: Patient is a 79 y.o. female seen in the SNF at [facility] today for evaluation of  Her discharge to AL after her recovering from the right hip fx surgical repair  and other chronic medical conditions. The patient was discharged from hospital since 09/11/12. The patient has regained her ambulation with walker and able to performed her ADL at assisted living level. She will be discharged to AL 11/05/12 Problem List Items Addressed This Visit   Acute blood loss anemia     Takes Fe 325mg  tid, last Hgb 8.4 09/17/12 and 11,5 10/18/12--will decrease Fe to bid and f/u CBC in 2weeks.         Anxiety and depression     Stable on Sertraline 25mg  daily.         BACK PAIN, LUMBAR     Hx of lower back pain, had X-ray of her lumbar spine 09/08/12--spondylosis-better controlled with Hydrocodone/ApAp  10/325mg  ac and hs and Flexeril 5mg  was changed to prn         COPD with asthma     Managed with Fluticasone-Salmeterol 250/50 bid, Epipen prn, Montelukast 10mg  daily, and Albuterol II puffs q6hr prn.         CORONARY ARTERY DISEASE     Presently no angina--prn NTG, daily ASA 81mg , Atorvastatin 20mg  for risk reduction.       Dementia     Presently Namenda XR 14mg  daily.        Essential hypertension     Controlled on Metoprolol 25mg  bid.         Essential thrombocythemia - Primary     Treated by Hematology, takes Hydrea 1000mg  Monday, 500mg  Wed, Fri, Sun. Plt 362 10/18/12    GERD     Stable on Omeprazole 20mg  daily.            Review of Systems:  Review of Systems  Constitutional: Negative for fever, chills, weight loss,  malaise/fatigue and diaphoresis.  HENT: Positive for hearing loss. Negative for ear pain, nosebleeds, congestion, sore throat, neck pain, tinnitus and ear discharge.   Eyes: Negative for blurred vision, double vision, photophobia, pain, discharge and redness.  Respiratory: Positive for cough and wheezing (occasional. ). Negative for hemoptysis, sputum production, shortness of breath and stridor.   Cardiovascular: Negative for chest pain, palpitations, orthopnea, claudication and PND. Leg swelling: trace RLE-resolved.   Gastrointestinal: Negative for heartburn, nausea, vomiting, abdominal pain, diarrhea, constipation, blood in stool and melena.  Genitourinary: Positive for frequency. Negative for dysuria, urgency, hematuria and flank pain.  Musculoskeletal: Positive for joint pain (right hip). Negative for myalgias and falls (recent and resulted in the right hip fx. ). Back pain: chronic back pain is managed with Norco.  Skin: Negative for itching and rash.  Neurological: Negative for dizziness, tingling, tremors, sensory change, speech change, focal weakness, seizures, loss of consciousness, weakness and headaches.  Endo/Heme/Allergies: Positive for environmental allergies. Negative for polydipsia. Bruises/bleeds easily.  Psychiatric/Behavioral: Positive for depression and memory loss. Negative for suicidal ideas, hallucinations and substance abuse. The patient is nervous/anxious. The patient does not have insomnia.  Past Medical History  Diagnosis Date  . Macular degeneration   . COPD (chronic obstructive pulmonary disease)   . Hypertension   . Coronary artery disease   . Hypercholesterolemia   . GERD (gastroesophageal reflux disease)   . Asthmatic bronchitis   . History of colonic polyps   . DJD (degenerative joint disease)   . Lumbar back pain   . Osteoporosis   . History of transient ischemic attack (TIA)   . Anxiety   . Essential thrombocythemia   . Diverticulosis 05/10/2012   . Osteoarthrosis, unspecified whether generalized or localized, unspecified site 05/09/2012  . Memory loss 05/09/2012  . Abnormal weight gain 05/09/2012  . Phlebitis and thrombophlebitis of other deep vessels of lower extremities 07/10/2010  . Essential thrombocythemia 06/09/2009   Past Surgical History  Procedure Laterality Date  . Appendectomy    . Cholecystectomy    . Tah and bso  08/21/1995  . Cataract extraction w/ intraocular lens  implant, bilateral    . Abdominal hysterectomy    . Hip arthroplasty Right 09/09/2012    Procedure: ARTHROPLASTY BIPOLAR HIP;  Surgeon: Shelda Pal, MD;  Location: [facility];  Service: Orthopedics;  Laterality: Right;   Social History:   reports that she has never smoked. She has never used smokeless tobacco. She reports that she does not drink alcohol or use illicit drugs.  Family History  Problem Relation Age of Onset  . Colon cancer Son 73  . Crohn's disease Son   . Cancer Maternal Aunt     Medications: Patient's Medications  New Prescriptions   No medications on file  Previous Medications   ALBUTEROL (PROAIR HFA) 108 (90 BASE) MCG/ACT INHALER    Inhale 2 puffs into the lungs every 6 (six) hours as needed for wheezing. As needed for wheezing   ASPIRIN EC 81 MG TABLET    Take 81 mg by mouth every morning.   ATORVASTATIN (LIPITOR) 20 MG TABLET    Take 20 mg by mouth every evening.    CHOLECALCIFEROL (VITAMIN D) 1000 UNITS TABLET    Take 1,000 Units by mouth every morning.   EPIPEN 2-PAK 0.3 MG/0.3ML DEVI    Inject 0.3 mg into the skin once as needed (allergic reaction).    FERROUS SULFATE 325 (65 FE) MG TABLET    Take 1 tablet (325 mg total) by mouth 3 (three) times daily after meals.   FLUTICASONE-SALMETEROL (ADVAIR) 250-50 MCG/DOSE AEPB    Inhale 1 puff into the lungs every 12 (twelve) hours.   HYDROCODONE-ACETAMINOPHEN (NORCO/VICODIN) 5-325 MG PER TABLET    Take 1-2 tablets by mouth every 6 (six) hours as needed for pain.   HYDROXYUREA (HYDREA)  500 MG CAPSULE    Take 500 mg by mouth See admin instructions. Take 2 capsules on Monday. Take 1 capsule every other day of the week (Wed, Fri, Sun)   MEMANTINE (NAMENDA) 5 MG TABLET    Take 1 tablet (5 mg total) by mouth 2 (two) times daily.   METOPROLOL TARTRATE (LOPRESSOR) 25 MG TABLET    Take 25 mg by mouth 2 (two) times daily.   MONTELUKAST (SINGULAIR) 10 MG TABLET    Take 10 mg by mouth at bedtime.   MULTIPLE VITAMIN (MULTIVITAMIN PO)    Take 1 tablet by mouth every morning.    MULTIPLE VITAMINS-MINERALS (MACULAR VITAMIN BENEFIT PO)    Take 1 tablet by mouth every morning.    NITROGLYCERIN (NITROSTAT) 0.4 MG SL TABLET    Place 0.4  mg under the tongue every 5 (five) minutes as needed for chest pain.    OMEPRAZOLE (PRILOSEC) 20 MG CAPSULE    Take 20 mg by mouth every morning.    SERTRALINE (ZOLOFT) 25 MG TABLET    Take 25 mg by mouth every evening.   WARFARIN (COUMADIN) 2.5 MG TABLET    Take 2.5 mg by mouth every evening.  Modified Medications   No medications on file  Discontinued Medications   No medications on file     Physical Exam: Physical Exam  Constitutional: She is oriented to person, place, and time. She appears well-developed and well-nourished. No distress.  HENT:  Head: Normocephalic and atraumatic.  Eyes: Conjunctivae and EOM are normal. Pupils are equal, round, and reactive to light.  Neck: Normal range of motion. Neck supple. No JVD present. No thyromegaly present.  Cardiovascular: Normal rate, regular rhythm and normal heart sounds.   Pulmonary/Chest: Effort normal. She has decreased breath sounds. She has no wheezes. She has no rales.  Abdominal: Soft. Bowel sounds are normal. There is no tenderness.  Musculoskeletal: Normal range of motion. She exhibits no edema. Tenderness: chronic lower back and the right hip since the fx. better controloled with scheduled Norco.   Lymphadenopathy:    She has no cervical adenopathy.  Neurological: She is alert and oriented to  person, place, and time. She has normal reflexes. No cranial nerve deficit. She exhibits normal muscle tone. Coordination normal.  Skin: Skin is warm and dry. No rash noted. She is not diaphoretic. No erythema (LLE).  The right hip surgical incision intact and no s/s cellulitis.   Psychiatric: Her mood appears not anxious. Her affect is not angry, not blunt, not labile and not inappropriate. Her speech is not delayed, not tangential and not slurred. She is slowed. She is not agitated, not aggressive, not withdrawn and not combative. Thought content is not paranoid and not delusional. Cognition and memory are impaired. She does not express impulsivity or inappropriate judgment. She exhibits a depressed mood. She exhibits abnormal recent memory.    Filed Vitals:   11/04/12 1606  BP: 128/82  Pulse: 78  Temp: 97.3 F (36.3 C)  TempSrc: Tympanic  Resp: 19      Labs reviewed: Basic Metabolic Panel:  Recent Labs  16/10/96 0405 09/10/12 0408 09/11/12 0455 09/17/12 10/18/12  NA 136 135 136 139  --   K 3.9 4.5 4.2 4.2  --   CL 103 103 105  --   --   CO2 25 26 26   --   --   GLUCOSE 154* 133* 132*  --   --   BUN 14 10 12 16   --   CREATININE 0.73 0.72 0.74 0.7  --   CALCIUM 8.4 8.4 8.4  --   --   TSH  --   --   --   --  2.81   Liver Function Tests:  Recent Labs  11/21/11 1330 07/16/12 1417  AST 20 23  ALT 16 17  ALKPHOS 46 72  BILITOT 0.50 0.32  PROT 6.4 6.6  ALBUMIN 3.8 3.2*   CBC:  Recent Labs  03/19/12 1558 07/16/12 1417  09/08/12 0925 09/09/12 0405 09/10/12 0408 09/11/12 0455 09/17/12 10/18/12  WBC 7.9 5.4  < > 11.1* 6.4 5.9 6.1 4.5 6.2  NEUTROABS 6.6* 3.4  --  10.0*  --   --   --   --   --   HGB 13.1 11.5*  < > 13.1 10.5*  9.6* 8.1* 8.4* 11.5*  HCT 38.5 34.2*  < > 39.4 32.6* 30.1* 25.2* 25* 34*  MCV 107.6* 97.6  < > 97.3 97.6 100.0 99.2  --   --   PLT 424* 345  < > 240 198 199 194 430* 362  < > = values in this interval not displayed.  Past  Procedures:  09/09/12  CT head:  IMPRESSION: No skull fracture or intracranial hemorrhage.  Small vessel disease type changes without CT evidence of large acute infarct.  X-ray right hip  IMPRESSION: Right femoral prosthesis without acute complication  X-ray Lumbar spine  IMPRESSION: Diffuse lumbar spondylosis.  No evidence of fracture.   X--ray R hip  IMPRESSION: Displaced right subcapital hip fracture.   Assessment/Plan Essential thrombocythemia Treated by Hematology, takes Hydrea 1000mg  Monday, 500mg  Wed, Fri, Sun. Plt 362 10/18/12  Acute blood loss anemia Takes Fe 325mg  tid, last Hgb 8.4 09/17/12 and 11,5 10/18/12--will decrease Fe to bid and f/u CBC in 2weeks.       GERD Stable on Omeprazole 20mg  daily.       CORONARY ARTERY DISEASE Presently no angina--prn NTG, daily ASA 81mg , Atorvastatin 20mg  for risk reduction.     Dementia Presently Namenda XR 14mg  daily.      DVT (deep venous thrombosis) Switched to [place] since 09/12/12. Left       COPD with asthma Managed with Fluticasone-Salmeterol 250/50 bid, Epipen prn, Montelukast 10mg  daily, and Albuterol II puffs q6hr prn.       BACK PAIN, LUMBAR Hx of lower back pain, had X-ray of her lumbar spine 09/08/12--spondylosis-better controlled with Hydrocodone/ApAp  10/325mg  ac and hs and Flexeril 5mg  was changed to prn       Anxiety and depression Stable on Sertraline 25mg  daily.       Essential hypertension Controlled on Metoprolol 25mg  bid.       Hip fracture Healing nicely, participates PT/OT, pain is mainly in her back even if with Hydrocodone/ApAp 10/325mg  qid. Eats meals in dinning room. Recovered right hip fx surgical repair--ambulates with walker on unit--will dc to AL where she resided prior to her traumatic right hip fx.       Family/ Staff Communication: observe the patient  Goals of Care: dc to AL  Labs/tests ordered: CBC in 2weeks.

## 2012-11-04 NOTE — Assessment & Plan Note (Signed)
Healing nicely, participates PT/OT, pain is mainly in her back even if with Hydrocodone/ApAp 10/325mg  qid. Eats meals in dinning room. Recovered right hip fx surgical repair--ambulates with walker on unit--will dc to AL where she resided prior to her traumatic right hip fx.

## 2012-11-04 NOTE — Assessment & Plan Note (Signed)
Controlled on Metoprolol 25mg bid.    

## 2012-11-04 NOTE — Assessment & Plan Note (Signed)
Stable on Omeprazole 20 mg daily.

## 2012-11-04 NOTE — Assessment & Plan Note (Signed)
Hx of lower back pain, had X-ray of her lumbar spine 09/08/12--spondylosis-better controlled with Hydrocodone/ApAp  10/325mg ac and hs and Flexeril 5mg was changed to prn        

## 2012-11-04 NOTE — Assessment & Plan Note (Signed)
Presently Namenda XR 14mg daily 

## 2012-11-04 NOTE — Assessment & Plan Note (Signed)
Treated by Hematology, takes Hydrea 1000mg Monday, 500mg Wed, Fri, Sun. Plt 362 10/18/12     

## 2012-11-04 NOTE — Assessment & Plan Note (Signed)
Takes Fe 325mg  tid, last Hgb 8.4 09/17/12 and 11,5 10/18/12--will decrease Fe to bid and f/u CBC in 2weeks.

## 2012-11-04 NOTE — Assessment & Plan Note (Signed)
Presently no angina--prn NTG, daily ASA 81mg, Atorvastatin 20mg for risk reduction.        

## 2012-11-05 LAB — CBC AND DIFFERENTIAL
HCT: 35 % — AB (ref 36–46)
Hemoglobin: 11.8 g/dL — AB (ref 12.0–16.0)
WBC: 6.3 10^3/mL

## 2012-11-18 ENCOUNTER — Encounter: Payer: Self-pay | Admitting: Nurse Practitioner

## 2012-11-18 ENCOUNTER — Non-Acute Institutional Stay (SKILLED_NURSING_FACILITY): Payer: Medicare Other | Admitting: Nurse Practitioner

## 2012-11-18 DIAGNOSIS — M545 Low back pain, unspecified: Secondary | ICD-10-CM

## 2012-11-18 DIAGNOSIS — F32A Depression, unspecified: Secondary | ICD-10-CM

## 2012-11-18 DIAGNOSIS — I10 Essential (primary) hypertension: Secondary | ICD-10-CM

## 2012-11-18 DIAGNOSIS — L853 Xerosis cutis: Secondary | ICD-10-CM

## 2012-11-18 DIAGNOSIS — F0390 Unspecified dementia without behavioral disturbance: Secondary | ICD-10-CM

## 2012-11-18 DIAGNOSIS — I82409 Acute embolism and thrombosis of unspecified deep veins of unspecified lower extremity: Secondary | ICD-10-CM

## 2012-11-18 DIAGNOSIS — L258 Unspecified contact dermatitis due to other agents: Secondary | ICD-10-CM

## 2012-11-18 DIAGNOSIS — F329 Major depressive disorder, single episode, unspecified: Secondary | ICD-10-CM

## 2012-11-18 DIAGNOSIS — D62 Acute posthemorrhagic anemia: Secondary | ICD-10-CM

## 2012-11-18 DIAGNOSIS — K219 Gastro-esophageal reflux disease without esophagitis: Secondary | ICD-10-CM

## 2012-11-18 DIAGNOSIS — F341 Dysthymic disorder: Secondary | ICD-10-CM

## 2012-11-18 DIAGNOSIS — I82402 Acute embolism and thrombosis of unspecified deep veins of left lower extremity: Secondary | ICD-10-CM

## 2012-11-18 DIAGNOSIS — D473 Essential (hemorrhagic) thrombocythemia: Secondary | ICD-10-CM

## 2012-11-18 DIAGNOSIS — J449 Chronic obstructive pulmonary disease, unspecified: Secondary | ICD-10-CM

## 2012-11-18 DIAGNOSIS — J4489 Other specified chronic obstructive pulmonary disease: Secondary | ICD-10-CM

## 2012-11-18 HISTORY — DX: Xerosis cutis: L85.3

## 2012-11-18 NOTE — Assessment & Plan Note (Signed)
Presently Namenda daily.

## 2012-11-18 NOTE — Assessment & Plan Note (Signed)
Switched to [place] since 09/12/12. Left   

## 2012-11-18 NOTE — Assessment & Plan Note (Signed)
Treated by Hematology, takes Hydrea 1000mg  Monday, 500mg  Wed, Fri, Sun. Plt 362 10/18/12

## 2012-11-18 NOTE — Progress Notes (Signed)
Patient ID: Sherri Crawford, female   DOB: [birth year], 79 y.o.   MRN: [id number] Code Status: Full Code.   Allergies  Allergen Reactions  . Sulfonamide Derivatives Other (See Comments)    Pt is unsure of reaction    Chief Complaint  Patient presents with  . Medical Managment of Chronic Issues    scaly itching shins.     HPI: Patient is a 79 y.o. female seen in the SNF at [facility] today for evaluation of scaly itching BLE and her other chronic medical conditions.  Problem List Items Addressed This Visit   Acute blood loss anemia     Takes Fe 325mg  tid, last Hgb 8.4 09/17/12 and 11,5 10/18/12-11.8 11/05/12--decreased Fe to 1d and f/u CBC in 2weeks scheduled.           Anxiety and depression     Stable on Sertraline 25mg  daily.         BACK PAIN, LUMBAR     Hx of lower back pain, had X-ray of her lumbar spine 09/08/12--spondylosis-better controlled with Hydrocodone/ApAp  10/325mg  ac and hs and Flexeril 5mg  was changed to prn           COPD with asthma     Managed with Fluticasone-Salmeterol 250/50 bid, Epipen prn, Montelukast 10mg  daily, and Albuterol II puffs q6hr prn.           Deep vein thrombosis of left lower extremity     Switched to Xeralto since 09/12/12. Left           Dementia     Presently Namenda daily.          Dry skin dermatitis - Primary     Scaly, itching, mild erythema mid of the R+L shins, the left is more prominent than the right. Will apply Mycolog II nightly to affected area for 2 weeks.     Essential hypertension     Controlled on Metoprolol 25mg  bid.           Essential thrombocythemia     Treated by Hematology, takes Hydrea 1000mg  Monday, 500mg  Wed, Fri, Sun. Plt 362 10/18/12      GERD     Stable on Omeprazole 20mg  daily.              Review of Systems:  Review of Systems  Constitutional: Negative for fever, chills, weight loss, malaise/fatigue and diaphoresis.  HENT: Positive for hearing  loss. Negative for ear pain, nosebleeds, congestion, sore throat, neck pain, tinnitus and ear discharge.   Eyes: Negative for blurred vision, double vision, photophobia, pain, discharge and redness.  Respiratory: Positive for cough and wheezing (occasional. ). Negative for hemoptysis, sputum production, shortness of breath and stridor.   Cardiovascular: Negative for chest pain, palpitations, orthopnea, claudication and PND. Leg swelling: trace RLE-resolved.   Gastrointestinal: Negative for heartburn, nausea, vomiting, abdominal pain, diarrhea, constipation, blood in stool and melena.  Genitourinary: Positive for frequency. Negative for dysuria, urgency, hematuria and flank pain.  Musculoskeletal: Positive for joint pain (right hip). Negative for myalgias and falls (recent and resulted in the right hip fx. ). Back pain: chronic back pain is managed with Norco.  Skin: Negative for itching and rash.       Itching scaly mid of R+L shins.   Neurological: Negative for dizziness, tingling, tremors, sensory change, speech change, focal weakness, seizures, loss of consciousness, weakness and headaches.  Endo/Heme/Allergies: Positive for environmental allergies. Negative for polydipsia. Bruises/bleeds easily.  Psychiatric/Behavioral: Positive for depression and  memory loss. Negative for suicidal ideas, hallucinations and substance abuse. The patient is nervous/anxious. The patient does not have insomnia.      Past Medical History  Diagnosis Date  . Macular degeneration   . COPD (chronic obstructive pulmonary disease)   . Hypertension   . Coronary artery disease   . Hypercholesterolemia   . GERD (gastroesophageal reflux disease)   . Asthmatic bronchitis   . History of colonic polyps   . DJD (degenerative joint disease)   . Lumbar back pain   . Osteoporosis   . History of transient ischemic attack (TIA)   . Anxiety   . Essential thrombocythemia   . Diverticulosis 05/10/2012  . Osteoarthrosis,  unspecified whether generalized or localized, unspecified site 05/09/2012  . Memory loss 05/09/2012  . Abnormal weight gain 05/09/2012  . Phlebitis and thrombophlebitis of other deep vessels of lower extremities 07/10/2010  . Essential thrombocythemia 06/09/2009   Past Surgical History  Procedure Laterality Date  . Appendectomy    . Cholecystectomy    . Tah and bso  08/21/1995  . Cataract extraction w/ intraocular lens  implant, bilateral    . Abdominal hysterectomy    . Hip arthroplasty Right 09/09/2012    Procedure: ARTHROPLASTY BIPOLAR HIP;  Surgeon: Shelda Pal, MD;  Location: [facility];  Service: Orthopedics;  Laterality: Right;   Social History:   reports that she has never smoked. She has never used smokeless tobacco. She reports that she does not drink alcohol or use illicit drugs.  Family History  Problem Relation Age of Onset  . Colon cancer Son 56  . Crohn's disease Son   . Cancer Maternal Aunt     Medications: Patient's Medications  New Prescriptions   No medications on file  Previous Medications   ALBUTEROL (PROAIR HFA) 108 (90 BASE) MCG/ACT INHALER    Inhale 2 puffs into the lungs every 6 (six) hours as needed for wheezing. As needed for wheezing   ASPIRIN EC 81 MG TABLET    Take 81 mg by mouth every morning.   ATORVASTATIN (LIPITOR) 20 MG TABLET    Take 20 mg by mouth every evening.    CHOLECALCIFEROL (VITAMIN D) 1000 UNITS TABLET    Take 1,000 Units by mouth every morning.   EPIPEN 2-PAK 0.3 MG/0.3ML DEVI    Inject 0.3 mg into the skin once as needed (allergic reaction).    FERROUS SULFATE 325 (65 FE) MG TABLET    Take 1 tablet (325 mg total) by mouth 3 (three) times daily after meals.   FLUTICASONE-SALMETEROL (ADVAIR) 250-50 MCG/DOSE AEPB    Inhale 1 puff into the lungs every 12 (twelve) hours.   HYDROCODONE-ACETAMINOPHEN (NORCO/VICODIN) 5-325 MG PER TABLET    Take 1-2 tablets by mouth every 6 (six) hours as needed for pain.   HYDROXYUREA (HYDREA) 500 MG CAPSULE     Take 500 mg by mouth See admin instructions. Take 2 capsules on Monday. Take 1 capsule every other day of the week (Wed, Fri, Sun)   MEMANTINE (NAMENDA) 5 MG TABLET    Take 1 tablet (5 mg total) by mouth 2 (two) times daily.   METOPROLOL TARTRATE (LOPRESSOR) 25 MG TABLET    Take 25 mg by mouth 2 (two) times daily.   MONTELUKAST (SINGULAIR) 10 MG TABLET    Take 10 mg by mouth at bedtime.   MULTIPLE VITAMIN (MULTIVITAMIN PO)    Take 1 tablet by mouth every morning.    MULTIPLE VITAMINS-MINERALS (MACULAR VITAMIN BENEFIT  PO)    Take 1 tablet by mouth every morning.    NITROGLYCERIN (NITROSTAT) 0.4 MG SL TABLET    Place 0.4 mg under the tongue every 5 (five) minutes as needed for chest pain.    OMEPRAZOLE (PRILOSEC) 20 MG CAPSULE    Take 20 mg by mouth every morning.    SERTRALINE (ZOLOFT) 25 MG TABLET    Take 25 mg by mouth every evening.   WARFARIN (COUMADIN) 2.5 MG TABLET    Take 2.5 mg by mouth every evening.  Modified Medications   No medications on file  Discontinued Medications   No medications on file     Physical Exam: Physical Exam  Constitutional: She is oriented to person, place, and time. She appears well-developed and well-nourished. No distress.  HENT:  Head: Normocephalic and atraumatic.  Eyes: Conjunctivae and EOM are normal. Pupils are equal, round, and reactive to light.  Neck: Normal range of motion. Neck supple. No JVD present. No thyromegaly present.  Cardiovascular: Normal rate, regular rhythm and normal heart sounds.   Pulmonary/Chest: Effort normal. She has decreased breath sounds. She has no wheezes. She has no rales.  Abdominal: Soft. Bowel sounds are normal. There is no tenderness.  Musculoskeletal: Normal range of motion. She exhibits no edema. Tenderness: chronic lower back and the right hip since the fx. better controloled with scheduled Norco.   Lymphadenopathy:    She has no cervical adenopathy.  Neurological: She is alert and oriented to person, place, and  time. She has normal reflexes. No cranial nerve deficit. She exhibits normal muscle tone. Coordination normal.  Skin: Skin is warm and dry. No rash noted. She is not diaphoretic. No erythema (LLE).  The right hip surgical incision healed. Itching/scaly mid shins  Psychiatric: Her mood appears not anxious. Her affect is not angry, not blunt, not labile and not inappropriate. Her speech is not delayed, not tangential and not slurred. She is slowed. She is not agitated, not aggressive, not withdrawn and not combative. Thought content is not paranoid and not delusional. Cognition and memory are impaired. She does not express impulsivity or inappropriate judgment. She exhibits a depressed mood. She exhibits abnormal recent memory.    Filed Vitals:   11/18/12 1216  BP: 128/82  Pulse: 78  Temp: 97.3 F (36.3 C)  TempSrc: Tympanic  Resp: 19      Labs reviewed: Basic Metabolic Panel:  Recent Labs  16/10/96 0405 09/10/12 0408 09/11/12 0455 09/17/12 10/18/12  NA 136 135 136 139  --   K 3.9 4.5 4.2 4.2  --   CL 103 103 105  --   --   CO2 25 26 26   --   --   GLUCOSE 154* 133* 132*  --   --   BUN 14 10 12 16   --   CREATININE 0.73 0.72 0.74 0.7  --   CALCIUM 8.4 8.4 8.4  --   --   TSH  --   --   --   --  2.81   Liver Function Tests:  Recent Labs  11/21/11 1330 07/16/12 1417  AST 20 23  ALT 16 17  ALKPHOS 46 72  BILITOT 0.50 0.32  PROT 6.4 6.6  ALBUMIN 3.8 3.2*   CBC:  Recent Labs  03/19/12 1558 07/16/12 1417  09/08/12 0925 09/09/12 0405 09/10/12 0408 09/11/12 0455 09/17/12 10/18/12 11/05/12  WBC 7.9 5.4  < > 11.1* 6.4 5.9 6.1 4.5 6.2 6.3  NEUTROABS 6.6* 3.4  --  10.0*  --   --   --   --   --   --   HGB 13.1 11.5*  < > 13.1 10.5* 9.6* 8.1* 8.4* 11.5* 11.8*  HCT 38.5 34.2*  < > 39.4 32.6* 30.1* 25.2* 25* 34* 35*  MCV 107.6* 97.6  < > 97.3 97.6 100.0 99.2  --   --   --   PLT 424* 345  < > 240 198 199 194 430* 362 353  < > = values in this interval not displayed.  Past  Procedures:  09/09/12  CT head:  IMPRESSION: No skull fracture or intracranial hemorrhage.  Small vessel disease type changes without CT evidence of large acute infarct.  X-ray right hip  IMPRESSION: Right femoral prosthesis without acute complication  X-ray Lumbar spine  IMPRESSION: Diffuse lumbar spondylosis.  No evidence of fracture.   X--ray R hip  IMPRESSION: Displaced right subcapital hip fracture.   Assessment/Plan Dry skin dermatitis Scaly, itching, mild erythema mid of the R+L shins, the left is more prominent than the right. Will apply Mycolog II nightly to affected area for 2 weeks.   Anxiety and depression Stable on Sertraline 25mg  daily.       BACK PAIN, LUMBAR Hx of lower back pain, had X-ray of her lumbar spine 09/08/12--spondylosis-better controlled with Hydrocodone/ApAp  10/325mg  ac and hs and Flexeril 5mg  was changed to prn         COPD with asthma Managed with Fluticasone-Salmeterol 250/50 bid, Epipen prn, Montelukast 10mg  daily, and Albuterol II puffs q6hr prn.         Essential thrombocythemia Treated by Hematology, takes Hydrea 1000mg  Monday, 500mg  Wed, Fri, Sun. Plt 362 10/18/12    Essential hypertension Controlled on Metoprolol 25mg  bid.         GERD Stable on Omeprazole 20mg  daily.         Dementia Presently Namenda daily.        Acute blood loss anemia Takes Fe 325mg  tid, last Hgb 8.4 09/17/12 and 11,5 10/18/12-11.8 11/05/12--decreased Fe to 1d and f/u CBC in 2weeks scheduled.         Deep vein thrombosis of left lower extremity Switched to Xeralto since 09/12/12. Left           Family/ Staff Communication: observe the patient  Goals of Care: dc to [facility]  Labs/tests ordered: CBC in 2weeks.

## 2012-11-18 NOTE — Assessment & Plan Note (Signed)
Stable on Omeprazole 20 mg daily.

## 2012-11-18 NOTE — Assessment & Plan Note (Signed)
Scaly, itching, mild erythema mid of the R+L shins, the left is more prominent than the right. Will apply Mycolog II nightly to affected area for 2 weeks.

## 2012-11-18 NOTE — Assessment & Plan Note (Signed)
Controlled on Metoprolol 25mg bid.    

## 2012-11-18 NOTE — Assessment & Plan Note (Signed)
Stable on Sertraline 25mg daily.    

## 2012-11-18 NOTE — Assessment & Plan Note (Addendum)
Takes Fe 325mg  tid, last Hgb 8.4 09/17/12 and 11,5 10/18/12-11.8 11/05/12--decreased Fe to 1d and f/u CBC in 2weeks scheduled.

## 2012-11-18 NOTE — Assessment & Plan Note (Signed)
Hx of lower back pain, had X-ray of her lumbar spine 09/08/12--spondylosis-better controlled with Hydrocodone/ApAp  10/325mg  ac and hs and Flexeril 5mg  was changed to prn

## 2012-11-18 NOTE — Assessment & Plan Note (Signed)
Managed with Fluticasone-Salmeterol 250/50 bid, Epipen prn, Montelukast 10mg daily, and Albuterol II puffs q6hr prn.  

## 2012-11-19 ENCOUNTER — Other Ambulatory Visit (HOSPITAL_BASED_OUTPATIENT_CLINIC_OR_DEPARTMENT_OTHER): Payer: Medicare Other | Admitting: Lab

## 2012-11-19 ENCOUNTER — Telehealth: Payer: Self-pay | Admitting: *Deleted

## 2012-11-19 ENCOUNTER — Ambulatory Visit (HOSPITAL_BASED_OUTPATIENT_CLINIC_OR_DEPARTMENT_OTHER): Payer: Medicare Other | Admitting: Oncology

## 2012-11-19 ENCOUNTER — Telehealth: Payer: Self-pay | Admitting: Oncology

## 2012-11-19 VITALS — BP 123/51 | HR 78 | Temp 98.8°F | Resp 18 | Ht 61.0 in | Wt 104.4 lb

## 2012-11-19 DIAGNOSIS — D473 Essential (hemorrhagic) thrombocythemia: Secondary | ICD-10-CM

## 2012-11-19 DIAGNOSIS — I82402 Acute embolism and thrombosis of unspecified deep veins of left lower extremity: Secondary | ICD-10-CM

## 2012-11-19 DIAGNOSIS — I82409 Acute embolism and thrombosis of unspecified deep veins of unspecified lower extremity: Secondary | ICD-10-CM

## 2012-11-19 LAB — COMPREHENSIVE METABOLIC PANEL (CC13)
ALT: 14 U/L (ref 0–55)
AST: 21 U/L (ref 5–34)
Albumin: 3.4 g/dL — ABNORMAL LOW (ref 3.5–5.0)
Alkaline Phosphatase: 77 U/L (ref 40–150)
BUN: 17.6 mg/dL (ref 7.0–26.0)
Calcium: 10.1 mg/dL (ref 8.4–10.4)
Creatinine: 0.9 mg/dL (ref 0.6–1.1)
Glucose: 127 mg/dl (ref 70–140)
Potassium: 4.7 mEq/L (ref 3.5–5.1)
Sodium: 142 mEq/L (ref 136–145)
Total Bilirubin: 0.36 mg/dL (ref 0.20–1.20)

## 2012-11-19 LAB — CBC WITH DIFFERENTIAL/PLATELET
BASO%: 0.7 % (ref 0.0–2.0)
Basophils Absolute: 0 10*3/uL (ref 0.0–0.1)
EOS%: 0.8 % (ref 0.0–7.0)
HCT: 38.4 % (ref 34.8–46.6)
HGB: 12.7 g/dL (ref 11.6–15.9)
LYMPH%: 11.4 % — ABNORMAL LOW (ref 14.0–49.7)
MCH: 35.4 pg — ABNORMAL HIGH (ref 25.1–34.0)
MCHC: 33.1 g/dL (ref 31.5–36.0)
MCV: 107.1 fL — ABNORMAL HIGH (ref 79.5–101.0)
MONO%: 4.6 % (ref 0.0–14.0)
NEUT#: 5.8 10*3/uL (ref 1.5–6.5)
NEUT%: 82.5 % — ABNORMAL HIGH (ref 38.4–76.8)
Platelets: 386 10*3/uL (ref 145–400)
RDW: 16.2 % — ABNORMAL HIGH (ref 11.2–14.5)

## 2012-11-19 LAB — LACTATE DEHYDROGENASE (CC13): LDH: 157 U/L (ref 125–245)

## 2012-11-19 NOTE — Telephone Encounter (Signed)
Left message for nurse at [facility] to continue same dose of Hydrea per Dr Cyndie Chime

## 2012-11-19 NOTE — Telephone Encounter (Signed)
gv and printeda ppt sched and avs for pt for March 2015.Marland KitchenMarland Kitchenpt has labs drawn at living facility

## 2012-11-20 NOTE — Progress Notes (Signed)
Hematology and Oncology Follow Up Visit  Sherri Crawford [id number] [birth year] 79 y.o. 11/20/2012 1:48 PM   Principle Diagnosis: Encounter Diagnoses  Name Primary?  . Essential thrombocythemia Yes  . DVT (deep venous thrombosis), left      Interim History:   Followup visit for this pleasant 22 year old woman currently residing [facility]. She is accompanied by her son. She is followed here for essential thrombocythemia and is on chronic oral chemotherapy with hydroxyurea. We also monitor her Coumadin status post unprovoked DVT left lower extremity May 2012 probably related to her underlying myeloproliferative disorder. We have had some difficulty getting her on a stable dose but the dose range is becoming more narrow between 2 and 3 mg of Coumadin. We have been monitoring her Coumadin in our office Coumadin clinic. Recently, the supervising physician at the assisted living facility elected to stop her Coumadin and put her on Xarelto.  She fell down and fractured her right femoral neck and was admitted to the hospital 09/08/2012. She underwent a right hip hemiarthroplasty the next day by Dr. Charlann Boxer. She has done well and is able to ambulate with a walker.   Medications: reviewed  Allergies:  Allergies  Allergen Reactions  . Sulfonamide Derivatives Other (See Comments)    Pt is unsure of reaction    Review of Systems: Constitutional:   No anorexia, weight loss, or fever HEENT: No sore throat Respiratory: No cough or dyspnea Cardiovascular:  No chest pain or palpitations Gastrointestinal: Intermittent constipation Genito-Urinary: No urinary tract symptoms Musculoskeletal: Chronic arthritis pain Neurologic: No headache or change in vision Skin: No rash or ecchymosis  Remaining ROS negative.    Physical Exam: Blood pressure 123/51, pulse 78, temperature 98.8 F (37.1 C), temperature source Oral, resp. rate 18, height 5\' 1"  (1.549 m), weight  104 lb 6.4 oz (47.356 kg). Wt Readings from Last 3 Encounters:  11/19/12 104 lb 6.4 oz (47.356 kg)  09/08/12 111 lb 15.9 oz (50.8 kg)  09/08/12 111 lb 15.9 oz (50.8 kg)     General appearance: Frail, elderly, Caucasian woman HENNT: Pharynx no erythema or exudate Lymph nodes: No cervical, supraclavicular, or axillary adenopathy Breasts: Lungs: Clear to auscultation resonant to percussion Heart: Regular rhythm, no murmur or gallop Abdomen: Soft, nontender, no mass, no organomegaly Extremities: No edema, no calf tenderness Musculoskeletal: Osteoarthritic changes of the hands GU: Vascular: No carotid bruits, no cyanosis Neurologic: Mental status intact, poor memory, motor strength 5 over 5 Skin: No rash or ecchymosis  Lab Results: Lab Results: White count differential: 83% neutrophils, 11% lymphocytes, 5% monocytes   Component Value Date   WBC 7.0 11/19/2012   HGB 12.7 11/19/2012   HCT 38.4 11/19/2012   MCV 107.1* 11/19/2012   PLT 386 11/19/2012     Chemistry      Component Value Date/Time   NA 142 11/19/2012 1128   NA 139 09/17/2012   NA 136 09/11/2012 0455   K 4.7 11/19/2012 1128   K 4.2 09/17/2012   CL 105 09/11/2012 0455   CL 106 07/16/2012 1417   CO2 26 11/19/2012 1128   CO2 26 09/11/2012 0455   BUN 17.6 11/19/2012 1128   BUN 16 09/17/2012   BUN 12 09/11/2012 0455   CREATININE 0.9 11/19/2012 1128   CREATININE 0.7 09/17/2012   CREATININE 0.74 09/11/2012 0455   GLU 99 09/17/2012      Component Value Date/Time   CALCIUM 10.1 11/19/2012 1128   CALCIUM 8.4 09/11/2012 0455  ALKPHOS 77 11/19/2012 1128   ALKPHOS 50 07/25/2011 1130   AST 21 11/19/2012 1128   AST 22 07/25/2011 1130   ALT 14 11/19/2012 1128   ALT 16 07/25/2011 1130   BILITOT 0.36 11/19/2012 1128   BILITOT 0.5 07/25/2011 1130       Impression: #1. Myeloproliferative disorder-essential thrombocythemia Well-controlled on low-dose hydroxyurea 1 g on Mondays, 500 mg other days of the week. Plan continue the same. Monthly CBCs  at the nursing facility faxed to our office.  #2. Left lower extremity DVT May, 2012 likely related to #1.  #3. Chronic anticoagulation secondary to #1 and #2. Son was curious as to why she was changed to Xarelto. I told him likely rationale was that this drug does not interfere with most medications and does not require intensive laboratory monitoring. The disadvantages are that we do not have a reversal agent yet. It is also much more expensive than Coumadin. In fact, her son tells me he is now having to pay $125 per month out of pocket for this medication. Laboratory monitoring in this lady is not an issue since she has to have frequent laboratory monitoring on the Hydrea. In addition, she is not on many medications and on nothing that would interfere with Coumadin. Her son would like her to go back on the Coumadin. I asked that he discuss this with the supervising physician at the nursing facility. We're perfectly willing to continue monitoring Coumadin through our office.  #4. Recent right femoral neck fracture after a fall  #5. Hypertension  #6. Coronary artery disease  #7. Hyperlipidemia  #8. Mild senile dementia on Namenda  #9. Macrocytosis known effect of hydroxyurea  #10. Degenerative arthritis of the spine   CC:.    Levert Feinstein, MD 9/24/20141:48 PM

## 2012-11-22 ENCOUNTER — Emergency Department (HOSPITAL_COMMUNITY): Payer: Medicare Other

## 2012-11-22 ENCOUNTER — Other Ambulatory Visit: Payer: Self-pay

## 2012-11-22 ENCOUNTER — Encounter (HOSPITAL_COMMUNITY): Payer: Self-pay

## 2012-11-22 ENCOUNTER — Inpatient Hospital Stay (HOSPITAL_COMMUNITY)
Admission: EM | Admit: 2012-11-22 | Discharge: 2012-11-26 | DRG: 391 | Disposition: A | Discharge status: Skilled Nursing Facility | Payer: Medicare Other | Attending: Internal Medicine | Admitting: Internal Medicine

## 2012-11-22 DIAGNOSIS — F419 Anxiety disorder, unspecified: Secondary | ICD-10-CM

## 2012-11-22 DIAGNOSIS — I2510 Atherosclerotic heart disease of native coronary artery without angina pectoris: Secondary | ICD-10-CM

## 2012-11-22 DIAGNOSIS — D62 Acute posthemorrhagic anemia: Secondary | ICD-10-CM

## 2012-11-22 DIAGNOSIS — E43 Unspecified severe protein-calorie malnutrition: Secondary | ICD-10-CM

## 2012-11-22 DIAGNOSIS — R079 Chest pain, unspecified: Secondary | ICD-10-CM

## 2012-11-22 DIAGNOSIS — Z8673 Personal history of transient ischemic attack (TIA), and cerebral infarction without residual deficits: Secondary | ICD-10-CM

## 2012-11-22 DIAGNOSIS — L853 Xerosis cutis: Secondary | ICD-10-CM

## 2012-11-22 DIAGNOSIS — Z8679 Personal history of other diseases of the circulatory system: Secondary | ICD-10-CM

## 2012-11-22 DIAGNOSIS — I10 Essential (primary) hypertension: Secondary | ICD-10-CM

## 2012-11-22 DIAGNOSIS — I82402 Acute embolism and thrombosis of unspecified deep veins of left lower extremity: Secondary | ICD-10-CM

## 2012-11-22 DIAGNOSIS — Z79899 Other long term (current) drug therapy: Secondary | ICD-10-CM

## 2012-11-22 DIAGNOSIS — R55 Syncope and collapse: Secondary | ICD-10-CM

## 2012-11-22 DIAGNOSIS — F32A Depression, unspecified: Secondary | ICD-10-CM

## 2012-11-22 DIAGNOSIS — M810 Age-related osteoporosis without current pathological fracture: Secondary | ICD-10-CM

## 2012-11-22 DIAGNOSIS — F0390 Unspecified dementia without behavioral disturbance: Secondary | ICD-10-CM

## 2012-11-22 DIAGNOSIS — R1310 Dysphagia, unspecified: Secondary | ICD-10-CM

## 2012-11-22 DIAGNOSIS — K222 Esophageal obstruction: Principal | ICD-10-CM

## 2012-11-22 DIAGNOSIS — B3789 Other sites of candidiasis: Secondary | ICD-10-CM | POA: Diagnosis present

## 2012-11-22 DIAGNOSIS — D131 Benign neoplasm of stomach: Secondary | ICD-10-CM | POA: Diagnosis present

## 2012-11-22 DIAGNOSIS — M1990 Unspecified osteoarthritis, unspecified site: Secondary | ICD-10-CM

## 2012-11-22 DIAGNOSIS — Z860100 Personal history of colon polyps, unspecified: Secondary | ICD-10-CM

## 2012-11-22 DIAGNOSIS — E7800 Pure hypercholesterolemia, unspecified: Secondary | ICD-10-CM

## 2012-11-22 DIAGNOSIS — I82409 Acute embolism and thrombosis of unspecified deep veins of unspecified lower extremity: Secondary | ICD-10-CM

## 2012-11-22 DIAGNOSIS — R1115 Cyclical vomiting syndrome unrelated to migraine: Secondary | ICD-10-CM | POA: Diagnosis present

## 2012-11-22 DIAGNOSIS — J449 Chronic obstructive pulmonary disease, unspecified: Secondary | ICD-10-CM

## 2012-11-22 DIAGNOSIS — K219 Gastro-esophageal reflux disease without esophagitis: Secondary | ICD-10-CM

## 2012-11-22 DIAGNOSIS — K2289 Other specified disease of esophagus: Secondary | ICD-10-CM

## 2012-11-22 DIAGNOSIS — K5730 Diverticulosis of large intestine without perforation or abscess without bleeding: Secondary | ICD-10-CM | POA: Diagnosis present

## 2012-11-22 DIAGNOSIS — I82509 Chronic embolism and thrombosis of unspecified deep veins of unspecified lower extremity: Secondary | ICD-10-CM | POA: Diagnosis present

## 2012-11-22 DIAGNOSIS — D473 Essential (hemorrhagic) thrombocythemia: Secondary | ICD-10-CM

## 2012-11-22 DIAGNOSIS — R600 Localized edema: Secondary | ICD-10-CM

## 2012-11-22 DIAGNOSIS — IMO0002 Reserved for concepts with insufficient information to code with codable children: Secondary | ICD-10-CM

## 2012-11-22 DIAGNOSIS — M545 Low back pain, unspecified: Secondary | ICD-10-CM

## 2012-11-22 DIAGNOSIS — F329 Major depressive disorder, single episode, unspecified: Secondary | ICD-10-CM

## 2012-11-22 DIAGNOSIS — D126 Benign neoplasm of colon, unspecified: Secondary | ICD-10-CM

## 2012-11-22 DIAGNOSIS — K228 Other specified diseases of esophagus: Secondary | ICD-10-CM | POA: Diagnosis present

## 2012-11-22 DIAGNOSIS — R112 Nausea with vomiting, unspecified: Secondary | ICD-10-CM

## 2012-11-22 DIAGNOSIS — J4489 Other specified chronic obstructive pulmonary disease: Secondary | ICD-10-CM

## 2012-11-22 DIAGNOSIS — H3530 Unspecified macular degeneration: Secondary | ICD-10-CM

## 2012-11-22 DIAGNOSIS — Z7901 Long term (current) use of anticoagulants: Secondary | ICD-10-CM

## 2012-11-22 LAB — COMPREHENSIVE METABOLIC PANEL
Alkaline Phosphatase: 81 U/L (ref 39–117)
BUN: 17 mg/dL (ref 6–23)
CO2: 24 mEq/L (ref 19–32)
Chloride: 101 mEq/L (ref 96–112)
Creatinine, Ser: 0.74 mg/dL (ref 0.50–1.10)
GFR calc Af Amer: 85 mL/min — ABNORMAL LOW (ref >90)
GFR calc non Af Amer: 73 mL/min — ABNORMAL LOW (ref >90)
Total Bilirubin: 0.5 mg/dL (ref 0.3–1.2)

## 2012-11-22 LAB — CBC WITH DIFFERENTIAL/PLATELET
Eosinophils Absolute: 0.1 10*3/uL (ref 0.0–0.7)
Eosinophils Relative: 1 % (ref 0–5)
Hemoglobin: 13.6 g/dL (ref 12.0–15.0)
Lymphs Abs: 1.2 10*3/uL (ref 0.7–4.0)
MCH: 35.1 pg — ABNORMAL HIGH (ref 26.0–34.0)
MCV: 108 fL — ABNORMAL HIGH (ref 78.0–100.0)
Monocytes Relative: 4 % (ref 3–12)
Platelets: 347 10*3/uL (ref 150–400)
RBC: 3.87 MIL/uL (ref 3.87–5.11)
RDW: 15 % (ref 11.5–15.5)

## 2012-11-22 LAB — LIPASE, BLOOD: Lipase: 16 U/L (ref 11–59)

## 2012-11-22 LAB — TROPONIN I: Troponin I: <0.3 ng/mL (ref <0.30)

## 2012-11-22 LAB — GLUCOSE, CAPILLARY: Glucose-Capillary: 72 mg/dL (ref 70–99)

## 2012-11-22 LAB — PROTIME-INR: Prothrombin Time: 14 seconds (ref 11.6–15.2)

## 2012-11-22 MED ORDER — ONDANSETRON HCL 4 MG PO TABS: 4.0000 mg | ORAL_TABLET | Freq: Four times a day (QID) | ORAL | Status: DC | PRN

## 2012-11-22 MED ORDER — ACETAMINOPHEN 325 MG PO TABS
650.0000 mg | ORAL_TABLET | Freq: Four times a day (QID) | ORAL | Status: DC | PRN
  Administered 2012-11-23 – 2012-11-25 (×4): 650 mg via ORAL
  Filled 2012-11-22 (×5): qty 2

## 2012-11-22 MED ORDER — HEPARIN (PORCINE) IN NACL 100-0.45 UNIT/ML-% IJ SOLN
700.0000 [IU]/h | INTRAMUSCULAR | Status: DC
  Administered 2012-11-23 – 2012-11-24 (×2): 700 [IU]/h via INTRAVENOUS
  Filled 2012-11-22 (×2): qty 250

## 2012-11-22 MED ORDER — METOPROLOL TARTRATE 1 MG/ML IV SOLN
2.5000 mg | Freq: Four times a day (QID) | INTRAVENOUS | Status: DC
  Administered 2012-11-23: 2.5 mg via INTRAVENOUS
  Administered 2012-11-23: 01:00:00 via INTRAVENOUS
  Administered 2012-11-23 – 2012-11-26 (×11): 2.5 mg via INTRAVENOUS
  Filled 2012-11-22 (×18): qty 5

## 2012-11-22 MED ORDER — ALBUTEROL SULFATE HFA 108 (90 BASE) MCG/ACT IN AERS
2.0000 | INHALATION_SPRAY | Freq: Four times a day (QID) | RESPIRATORY_TRACT | Status: DC | PRN
  Filled 2012-11-22: qty 6.7

## 2012-11-22 MED ORDER — MOMETASONE FURO-FORMOTEROL FUM 100-5 MCG/ACT IN AERO
2.0000 | INHALATION_SPRAY | Freq: Two times a day (BID) | RESPIRATORY_TRACT | Status: DC
  Administered 2012-11-23 – 2012-11-26 (×7): 2 via RESPIRATORY_TRACT
  Filled 2012-11-22: qty 8.8

## 2012-11-22 MED ORDER — PANTOPRAZOLE SODIUM 40 MG IV SOLR
40.0000 mg | Freq: Every day | INTRAVENOUS | Status: DC
Start: 2012-11-22 — End: 2012-11-26
  Administered 2012-11-23 – 2012-11-25 (×4): 40 mg via INTRAVENOUS
  Filled 2012-11-22 (×5): qty 40

## 2012-11-22 MED ORDER — ACETAMINOPHEN 650 MG RE SUPP
650.0000 mg | Freq: Four times a day (QID) | RECTAL | Status: DC | PRN
  Filled 2012-11-22: qty 1

## 2012-11-22 MED ORDER — SODIUM CHLORIDE 0.9 % IJ SOLN
3.0000 mL | Freq: Two times a day (BID) | INTRAMUSCULAR | Status: DC
  Administered 2012-11-23: 10 mL via INTRAVENOUS
  Administered 2012-11-25: 3 mL via INTRAVENOUS

## 2012-11-22 MED ORDER — SODIUM CHLORIDE 0.9 % IV SOLN
INTRAVENOUS | Status: DC
  Administered 2012-11-22: 18:00:00 via INTRAVENOUS

## 2012-11-22 MED ORDER — ONDANSETRON HCL 4 MG/2ML IJ SOLN: 4.0000 mg | Freq: Four times a day (QID) | INTRAMUSCULAR | Status: DC | PRN

## 2012-11-22 MED ORDER — SODIUM CHLORIDE 0.9 % IV SOLN
INTRAVENOUS | Status: AC
  Administered 2012-11-23: 01:00:00 via INTRAVENOUS

## 2012-11-22 MED ORDER — NITROGLYCERIN 0.4 MG SL SUBL
0.4000 mg | SUBLINGUAL_TABLET | SUBLINGUAL | Status: DC | PRN
  Administered 2012-11-23: 0.4 mg via SUBLINGUAL
  Filled 2012-11-22: qty 25

## 2012-11-22 MED ORDER — ONDANSETRON HCL 4 MG/2ML IJ SOLN
4.0000 mg | Freq: Once | INTRAMUSCULAR | Status: AC
  Administered 2012-11-22: 4 mg via INTRAVENOUS
  Filled 2012-11-22: qty 2

## 2012-11-22 MED ORDER — SODIUM CHLORIDE 0.9 % IV BOLUS (SEPSIS)
500.0000 mL | Freq: Once | INTRAVENOUS | Status: AC
  Administered 2012-11-22: 500 mL via INTRAVENOUS

## 2012-11-22 NOTE — ED Notes (Signed)
Patient transported to X-ray 

## 2012-11-22 NOTE — ED Notes (Signed)
Patient is a resident of [facility]. Patient was brought in by a family member and reports that the patient has been vomiting x 3 days. Patient was seen at [facility] by a physician. Blood work and CXR were completed yesterday. Physician at [facility] wanted patient seen in the ED for further evaluation and treatment. Patient has a history of having to have her esophagus stretched.

## 2012-11-22 NOTE — ED Provider Notes (Signed)
CSN: [id number]     Arrival date & time 11/22/12  1344 History   First MD Initiated Contact with Patient 11/22/12 1636     Chief Complaint  Patient presents with  . Emesis   (Consider location/radiation/quality/duration/timing/severity/associated sxs/prior Treatment) Patient is a 79 y.o. female presenting with vomiting. The history is provided by the patient.  Emesis  patient here complaining of vomiting that occurs after she eats. History of esophageal stricture and she is concerned that is was: This time. Seen by her Dr. yesterday and had a chest x-ray as well as blood work which I reviewed which didn't show any acute processes. Patient denies any fever or chills. Her emesis has not been bloody. She denies any urinary symptoms. States that she is currently vomiting her saliva. Has not had an esophageal stretching recently. Was sent here by her Dr. for further evaluation. No treatment used prior to arrival and nothing makes her symptoms better  Past Medical History  Diagnosis Date  . Macular degeneration   . COPD (chronic obstructive pulmonary disease)   . Hypertension   . Coronary artery disease   . Hypercholesterolemia   . GERD (gastroesophageal reflux disease)   . Asthmatic bronchitis   . History of colonic polyps   . DJD (degenerative joint disease)   . Lumbar back pain   . Osteoporosis   . History of transient ischemic attack (TIA)   . Anxiety   . Essential thrombocythemia   . Diverticulosis 05/10/2012  . Osteoarthrosis, unspecified whether generalized or localized, unspecified site 05/09/2012  . Memory loss 05/09/2012  . Abnormal weight gain 05/09/2012  . Phlebitis and thrombophlebitis of other deep vessels of lower extremities 07/10/2010  . Essential thrombocythemia 06/09/2009   Past Surgical History  Procedure Laterality Date  . Appendectomy    . Cholecystectomy    . Tah and bso  08/21/1995  . Cataract extraction w/ intraocular lens  implant, bilateral    . Abdominal  hysterectomy    . Hip arthroplasty Right 09/09/2012    Procedure: ARTHROPLASTY BIPOLAR HIP;  Surgeon: Shelda Pal, MD;  Location: [facility];  Service: Orthopedics;  Laterality: Right;   Family History  Problem Relation Age of Onset  . Colon cancer Son 49  . Crohn's disease Son   . Cancer Maternal Aunt    History  Substance Use Topics  . Smoking status: Never Smoker   . Smokeless tobacco: Never Used  . Alcohol Use: No   OB History   Grav Para Term Preterm Abortions TAB SAB Ect Mult Living                 Review of Systems  Gastrointestinal: Positive for vomiting.  All other systems reviewed and are negative.    Allergies  Sulfonamide derivatives  Home Medications   Current Outpatient Rx  Name  Route  Sig  Dispense  Refill  . aspirin EC 81 MG tablet   Oral   Take 81 mg by mouth every morning.         Marland Kitchen atorvastatin (LIPITOR) 20 MG tablet   Oral   Take 20 mg by mouth every evening.          . cholecalciferol (VITAMIN D) 1000 UNITS tablet   Oral   Take 1,000 Units by mouth every morning.         . feeding supplement (RESOURCE BREEZE) LIQD   Oral   Take 1 Container by mouth 4 (four) times daily.         Marland Kitchen  ferrous sulfate 325 (65 FE) MG tablet   Oral   Take 325 mg by mouth 2 (two) times daily.         . Fluticasone-Salmeterol (ADVAIR) 250-50 MCG/DOSE AEPB   Inhalation   Inhale 1 puff into the lungs every 12 (twelve) hours.         Marland Kitchen HYDROcodone-acetaminophen (NORCO) 10-325 MG per tablet   Oral   Take 1 tablet by mouth 4 (four) times daily -  before meals and at bedtime.          . hydroxyurea (HYDREA) 500 MG capsule   Oral   Take 500 mg by mouth See admin instructions. Take 2 capsules on Monday. Take 1 capsule every other day of the week (Wed, Fri, Sun)         . memantine (NAMENDA) 5 MG tablet   Oral   Take 1 tablet (5 mg total) by mouth 2 (two) times daily.   60 tablet   5   . metoprolol tartrate (LOPRESSOR) 25 MG tablet   Oral    Take 25 mg by mouth 2 (two) times daily.         . montelukast (SINGULAIR) 10 MG tablet   Oral   Take 10 mg by mouth at bedtime.         . Multiple Vitamins-Minerals (CERTAVITE/ANTIOXIDANTS PO)   Oral   Take 1 capsule by mouth every morning.         . Multiple Vitamins-Minerals (MACULAR VITAMIN BENEFIT PO)   Oral   Take 1 tablet by mouth every morning.          Marland Kitchen omeprazole (PRILOSEC) 20 MG capsule   Oral   Take 20 mg by mouth every morning.          . Rivaroxaban (XARELTO) 15 MG TABS tablet   Oral   Take 15 mg by mouth daily.         . sertraline (ZOLOFT) 25 MG tablet   Oral   Take 25 mg by mouth every evening.         Marland Kitchen albuterol (PROAIR HFA) 108 (90 BASE) MCG/ACT inhaler   Inhalation   Inhale 2 puffs into the lungs every 6 (six) hours as needed for wheezing. As needed for wheezing   1 Inhaler   11   . EPIPEN 2-PAK 0.3 MG/0.3ML DEVI   Subcutaneous   Inject 0.3 mg into the skin once as needed (allergic reaction).          . nitroGLYCERIN (NITROSTAT) 0.4 MG SL tablet   Sublingual   Place 0.4 mg under the tongue every 5 (five) minutes as needed for chest pain.           BP 127/69  Pulse 94  Temp(Src) 98.8 F (37.1 C) (Oral)  Resp 16  SpO2 95% Physical Exam  Nursing note and vitals reviewed. Constitutional: She is oriented to person, place, and time. She appears well-developed and well-nourished.  Non-toxic appearance. No distress.  HENT:  Head: Normocephalic and atraumatic.  Eyes: Conjunctivae, EOM and lids are normal. Pupils are equal, round, and reactive to light.  Neck: Normal range of motion. Neck supple. No tracheal deviation present. No mass present.  Cardiovascular: Normal rate, regular rhythm and normal heart sounds.  Exam reveals no gallop.   No murmur heard. Pulmonary/Chest: Effort normal and breath sounds normal. No stridor. No respiratory distress. She has no decreased breath sounds. She has no wheezes. She has no rhonchi. She has  no rales.  Abdominal: Soft. Normal appearance and bowel sounds are normal. She exhibits no distension. There is no tenderness. There is no rebound and no CVA tenderness.  Musculoskeletal: Normal range of motion. She exhibits no edema and no tenderness.  Neurological: She is alert and oriented to person, place, and time. She has normal strength. No cranial nerve deficit or sensory deficit. GCS eye subscore is 4. GCS verbal subscore is 5. GCS motor subscore is 6.  Skin: Skin is warm and dry. No abrasion and no rash noted.  Psychiatric: She has a normal mood and affect. Her speech is normal and behavior is normal.    ED Course  Procedures (including critical care time) Labs Review Labs Reviewed  CBC WITH DIFFERENTIAL  COMPREHENSIVE METABOLIC PANEL  LIPASE, BLOOD   Imaging Review No results found.  MDM  No diagnosis found.  Patient given IV fluids here as well as anti-emetics. Will be admitted by the hospitalist service for evaluation of possible esophageal stricture.   Toy Baker, MD 11/22/12 1946

## 2012-11-22 NOTE — Progress Notes (Signed)
Spoke with [facility] nurse Kathie Rhodes. She called to verify the reason for admitting Sherri Crawford. Kathie Rhodes verified client's name, DOB, and spousal information. Cortney verbally verified that it was acceptable to discuss her information with Kathie Rhodes. This was witnessed by Angelique Blonder (Nurse Tech). Information was discussed at patient's bedside.

## 2012-11-22 NOTE — Progress Notes (Signed)
ANTICOAGULATION CONSULT NOTE - Initial Consult  Pharmacy Consult for Heparin Indication: DVT  Allergies  Allergen Reactions  . Sulfonamide Derivatives Other (See Comments)    Pt is unsure of reaction    Patient Measurements: Height: 5\' 1"  (154.9 cm) Weight: 104 lb 8 oz (47.4 kg) IBW/kg (Calculated) : 47.8 Heparin Dosing Weight: 47 kg  Vital Signs: Temp: 98.8 F (37.1 C) (09/26 1359) Temp src: Oral (09/26 1359) BP: 153/68 mmHg (09/26 1848) Pulse Rate: 63 (09/26 1848)  Labs:  Recent Labs  11/22/12 1800  HGB 13.6  HCT 41.8  PLT 347  LABPROT 14.0  INR 1.10  CREATININE 0.74    Estimated Creatinine Clearance: 35.7 ml/min (by C-G formula based on Cr of 0.74).   Medical History: Past Medical History  Diagnosis Date  . Macular degeneration   . COPD (chronic obstructive pulmonary disease)   . Hypertension   . Coronary artery disease   . Hypercholesterolemia   . GERD (gastroesophageal reflux disease)   . Asthmatic bronchitis   . History of colonic polyps   . DJD (degenerative joint disease)   . Lumbar back pain   . Osteoporosis   . History of transient ischemic attack (TIA)   . Anxiety   . Essential thrombocythemia   . Diverticulosis 05/10/2012  . Osteoarthrosis, unspecified whether generalized or localized, unspecified site 05/09/2012  . Memory loss 05/09/2012  . Abnormal weight gain 05/09/2012  . Phlebitis and thrombophlebitis of other deep vessels of lower extremities 07/10/2010  . Essential thrombocythemia 06/09/2009    Medications:  Scheduled:  . [START ON 11/23/2012] metoprolol  2.5 mg Intravenous Q6H  . mometasone-formoterol  2 puff Inhalation BID  . pantoprazole (PROTONIX) IV  40 mg Intravenous QHS  . sodium chloride  3 mL Intravenous Q12H   Infusions:  . sodium chloride      Assessment:  79 year old female with h/o myeloproliferative disorder-essential thrombocythemia and DVT (May 2012 suspected due to essential thrombocythemia).  On Xarelto 15mg   daily PTA  Patient presents with complaint of vomiting and has a h/o esophageal stricture.  To be admitted for evaluation of possible esophageal stricture  Xarelto on hold pending evaluation and IV heparin to begin for anticoagulation  Last dose of Xarelto taken per Med Rec on 9/25 @ 18:00   Goal of Therapy:  Heparin level 0.3-0.7 units/ml Monitor platelets by anticoagulation protocol: Yes   Plan:   Heparin 700 units/hr  Check heparin level 8 hrs after heparin started  Check daily heparin level & CBC while on heparin  Sherri Crawford, Joselyn Glassman, PharmD 11/22/2012,10:29 PM

## 2012-11-22 NOTE — ED Notes (Signed)
Changing room assignment.  Pt room assignment delayed.

## 2012-11-23 ENCOUNTER — Other Ambulatory Visit: Payer: Self-pay

## 2012-11-23 DIAGNOSIS — R079 Chest pain, unspecified: Secondary | ICD-10-CM | POA: Diagnosis present

## 2012-11-23 LAB — COMPREHENSIVE METABOLIC PANEL
Alkaline Phosphatase: 73 U/L (ref 39–117)
BUN: 14 mg/dL (ref 6–23)
CO2: 22 mEq/L (ref 19–32)
Calcium: 8.5 mg/dL (ref 8.4–10.5)
GFR calc Af Amer: 84 mL/min — ABNORMAL LOW (ref >90)
GFR calc non Af Amer: 73 mL/min — ABNORMAL LOW (ref >90)
Glucose, Bld: 69 mg/dL — ABNORMAL LOW (ref 70–99)
Potassium: 3.6 mEq/L (ref 3.5–5.1)
Sodium: 143 mEq/L (ref 135–145)
Total Protein: 6.1 g/dL (ref 6.0–8.3)

## 2012-11-23 LAB — CBC WITH DIFFERENTIAL/PLATELET
Basophils Absolute: 0 10*3/uL (ref 0.0–0.1)
HCT: 39.1 % (ref 36.0–46.0)
Hemoglobin: 12.4 g/dL (ref 12.0–15.0)
Lymphocytes Relative: 12 % (ref 12–46)
MCHC: 31.7 g/dL (ref 30.0–36.0)
Monocytes Absolute: 0.3 10*3/uL (ref 0.1–1.0)
Monocytes Relative: 4 % (ref 3–12)
Neutro Abs: 6.9 10*3/uL (ref 1.7–7.7)
Platelets: 316 10*3/uL (ref 150–400)
RDW: 14.9 % (ref 11.5–15.5)
WBC: 8.4 10*3/uL (ref 4.0–10.5)

## 2012-11-23 LAB — TROPONIN I: Troponin I: <0.3 ng/mL (ref <0.30)

## 2012-11-23 LAB — GLUCOSE, CAPILLARY
Glucose-Capillary: 72 mg/dL (ref 70–99)
Glucose-Capillary: 64 mg/dL — ABNORMAL LOW (ref 70–99)

## 2012-11-23 LAB — CBC
Hemoglobin: 12.4 g/dL (ref 12.0–15.0)
MCH: 35.4 pg — ABNORMAL HIGH (ref 26.0–34.0)
MCV: 109.7 fL — ABNORMAL HIGH (ref 78.0–100.0)
RBC: 3.5 MIL/uL — ABNORMAL LOW (ref 3.87–5.11)
WBC: 7.3 10*3/uL (ref 4.0–10.5)

## 2012-11-23 LAB — TSH: TSH: 0.129 u[IU]/mL — ABNORMAL LOW (ref 0.350–4.500)

## 2012-11-23 MED ORDER — DEXTROSE 50 % IV SOLN
25.0000 mL | Freq: Once | INTRAVENOUS | Status: AC | PRN
  Administered 2012-11-23: 25 mL via INTRAVENOUS

## 2012-11-23 MED ORDER — DEXTROSE 50 % IV SOLN
INTRAVENOUS | Status: AC
  Administered 2012-11-23: 25 mL via INTRAVENOUS
  Filled 2012-11-23: qty 50

## 2012-11-23 MED ORDER — HYDRALAZINE HCL 20 MG/ML IJ SOLN
10.0000 mg | INTRAMUSCULAR | Status: DC | PRN
  Administered 2012-11-24: 10 mg via INTRAVENOUS
  Filled 2012-11-23: qty 1

## 2012-11-23 MED ORDER — ALUM & MAG HYDROXIDE-SIMETH 200-200-20 MG/5ML PO SUSP
30.0000 mL | Freq: Once | ORAL | Status: AC
  Administered 2012-11-23: 30 mL via ORAL
  Filled 2012-11-23: qty 30

## 2012-11-23 MED ORDER — VITAMINS A & D EX OINT
TOPICAL_OINTMENT | CUTANEOUS | Status: AC
  Administered 2012-11-23: 14:00:00 via TOPICAL
  Filled 2012-11-23: qty 5

## 2012-11-23 NOTE — Progress Notes (Signed)
[facility] hospital[facility] progress note. Chief complaint. Chest pain. History of present illness. This 79 year old female admitted today with complaints of vomiting. Apparently she complained of chest pain during the admission process and a point-of-care troponin was obtained and this resulted less than 0.3. The patient was initiated on when necessary nitroglycerin, heparin drip, beta blocker and admitted to a telemetry floor. Patient now again complaints of 7/10 chest pain the nursing staff. I came to see the patient at bedside and 12-lead EKG was obtained. I reviewed the EKG and this does not appear significantly different than the EKG obtained earlier in the emergency room. Patient complains of pain just left of the sternum without radiation, diaphoresis, or nausea. She does complain of a headache. Vital signs. Temperature 90.8, pulse 92, respiration 20, blood pressure 172/65. O2 sats 98%. General appearance. Frail elderly female who is alert, cooperative and in no distress. Cardiac. Rate and rhythm regular. Lungs. Breath sounds clear and equal. Abdomen. Soft with positive bowel sounds. No pain with palpation. Impression/plan. Problem #1. Chest pain. A 12-lead EKG obtained at bedside does not appear significantly different than that done earlier in the emergency room. Nursing will utilize sublingual nitroglycerin as ordered. I also added a dose of Maalox in case some of his pain is related to GERD. The patient already has orders for beta blocker and heparin drip. Troponins ordered x3 sets every 6 hours but the first troponin is still pending. We'll follow for these results.

## 2012-11-23 NOTE — Progress Notes (Signed)
Patient seen and examined and discussed with son Annette Stable at bedside and son Gene via telephone. Admitted earlier today with regurgitation of food. She has a h/o esophageal strictures and was last dilated 2 years ago. She states this feels similar to what she usually feels when it is time to get dilated again.  Will keep on IVF. Have spoken with Dr. Loreta Ave (covering for [facility]), who will see patient in consultation. Will continue to follow.  Peggye Pitt, MD [facility] Pager: [phone]

## 2012-11-23 NOTE — Progress Notes (Signed)
0030: Patient complained of CP 7/10 to mid chest area. Pt describes pain as being "sharp". She also complains of nausea and headache. EKG and Vital signs are obtained. 0045Benedetto Crawford is notified of complaints and status, as well as pending meds from prior orders. 2956: Tom to floor to assess patient's status.  Nitro given as per orders. Will continue to monitor

## 2012-11-23 NOTE — Progress Notes (Signed)
Patient refuses soap suds enema at this time.

## 2012-11-23 NOTE — Progress Notes (Signed)
ANTICOAGULATION CONSULT NOTE - Follow up  Pharmacy Consult for Heparin Indication: DVT  Allergies  Allergen Reactions  . Sulfonamide Derivatives Other (See Comments)    Pt is unsure of reaction   Patient Measurements: Height: 5\' 1"  (154.9 cm) Weight: 104 lb 8 oz (47.4 kg) IBW/kg (Calculated) : 47.8 Heparin Dosing Weight: 47 kg  Vital Signs: Temp: 97.5 F (36.4 C) (09/27 0554) Temp src: Oral (09/27 0554) BP: 170/61 mmHg (09/27 0653) Pulse Rate: 89 (09/27 0653)  Labs:  Recent Labs  11/22/12 1800 11/22/12 2249 11/23/12 0435 11/23/12 0730  HGB 13.6  --  12.4 12.4  HCT 41.8  --  39.1 38.4  PLT 347  --  316 305  LABPROT 14.0  --   --   --   INR 1.10  --   --   --   HEPARINUNFRC  --   --   --  0.52  CREATININE 0.74  --  0.75  --   TROPONINI  --  <0.30 <0.30  --    Estimated Creatinine Clearance: 35.7 ml/min (by C-G formula based on Cr of 0.75).  Assessment: 79 year old female with h/o myeloproliferative disorder-essential thrombocythemia and DVT (May 2012 suspected due to essential thrombocythemia).  On Xarelto 15mg  daily PTA. Presented 9/26 with vomiting. Has a hx of esophageal stricture. Xarelto on hold pending evaluation and IV heparin began while off Xarelto.  Heparin level in target range on 700units/hr.  CBC ok.  No bleeding reported/documented.  Goal of Therapy:  Heparin level 0.3-0.7 units/ml Monitor platelets by anticoagulation protocol: Yes   Plan:   Continue heparin at 700 units/hr.  Check a confirmatory heparin level this afternoon.   Daily heparin level & q48h CBC while on heparin.  Charolotte Eke, PharmD, pager [phone]. 11/23/2012,8:41 AM.

## 2012-11-23 NOTE — H&P (Signed)
[facility] History and Physical  Sherri Crawford AVW:[id number] DOB: 1920/12/20 DOA: 11/22/2012  Referring physician: ER physician. PCP: Kimber Relic, MD  Specialists: Dr. Cyndie Chime. Hematologist.  Chief Complaint: Nausea vomiting.  HPI: Sherri Crawford is a 79 y.o. female with history of myelodysplastic syndrome (essential thrombocythemia), history of DVT on xarelto, hypertension, COPD presented to the ER because of persistent vomiting and unable to eat over the last 2 days. Patient states that generally she tries to eat she has no difficulty in swallowing and has an appetite but after eating she immediately throws up. This has been happening last 2 days. Denies any abdominal pain diarrhea fever chills. Over the last few days patient also noticed some chest pressure. The pressure lasts a few minutes and his present even at rest. EKG shows some T-wave inversion in V3 V4 and inferior leads. Troponin is pending. Acute abdominal series only showed some stool burden otherwise unremarkable. Patient has been in bed for further management. Patient denies any shortness of breath presently chest pain-free.  Review of Systems: As presented in the history of presenting illness, rest negative.  Past Medical History  Diagnosis Date  . Macular degeneration   . COPD (chronic obstructive pulmonary disease)   . Hypertension   . Coronary artery disease   . Hypercholesterolemia   . GERD (gastroesophageal reflux disease)   . Asthmatic bronchitis   . History of colonic polyps   . DJD (degenerative joint disease)   . Lumbar back pain   . Osteoporosis   . History of transient ischemic attack (TIA)   . Anxiety   . Essential thrombocythemia   . Diverticulosis 05/10/2012  . Osteoarthrosis, unspecified whether generalized or localized, unspecified site 05/09/2012  . Memory loss 05/09/2012  . Abnormal weight gain 05/09/2012  . Phlebitis and thrombophlebitis of other deep vessels of lower extremities  07/10/2010  . Essential thrombocythemia 06/09/2009   Past Surgical History  Procedure Laterality Date  . Appendectomy    . Cholecystectomy    . Tah and bso  08/21/1995  . Cataract extraction w/ intraocular lens  implant, bilateral    . Abdominal hysterectomy    . Hip arthroplasty Right 09/09/2012    Procedure: ARTHROPLASTY BIPOLAR HIP;  Surgeon: Shelda Pal, MD;  Location: [facility];  Service: Orthopedics;  Laterality: Right;   Social History:  reports that she has never smoked. She has never used smokeless tobacco. She reports that she does not drink alcohol or use illicit drugs. Home. where does patient live-- Can do ADLs. Can patient participate in ADLs?  Allergies  Allergen Reactions  . Sulfonamide Derivatives Other (See Comments)    Pt is unsure of reaction    Family History  Problem Relation Age of Onset  . Colon cancer Son 50  . Crohn's disease Son   . Cancer Maternal Aunt       Prior to Admission medications   Medication Sig Start Date End Date Taking? Authorizing Provider  aspirin EC 81 MG tablet Take 81 mg by mouth every morning.   Yes Historical Provider, MD  atorvastatin (LIPITOR) 20 MG tablet Take 20 mg by mouth every evening.  10/25/11  Yes Michele Mcalpine, MD  cholecalciferol (VITAMIN D) 1000 UNITS tablet Take 1,000 Units by mouth every morning.   Yes Historical Provider, MD  feeding supplement (RESOURCE BREEZE) LIQD Take 1 Container by mouth 4 (four) times daily.   Yes Historical Provider, MD  ferrous sulfate 325 (65 FE) MG tablet Take 325  mg by mouth 2 (two) times daily.   Yes Historical Provider, MD  Fluticasone-Salmeterol (ADVAIR) 250-50 MCG/DOSE AEPB Inhale 1 puff into the lungs every 12 (twelve) hours.   Yes Historical Provider, MD  HYDROcodone-acetaminophen (NORCO) 10-325 MG per tablet Take 1 tablet by mouth 4 (four) times daily -  before meals and at bedtime.    Yes Historical Provider, MD  hydroxyurea (HYDREA) 500 MG capsule Take 500 mg by mouth See admin  instructions. Take 2 capsules on Monday. Take 1 capsule every other day of the week (Wed, Fri, Sun) 04/27/11  Yes Levert Feinstein, MD  memantine (NAMENDA) 5 MG tablet Take 1 tablet (5 mg total) by mouth 2 (two) times daily. 10/30/12  Yes Mahima Glade Lloyd, MD  metoprolol tartrate (LOPRESSOR) 25 MG tablet Take 25 mg by mouth 2 (two) times daily.   Yes Historical Provider, MD  montelukast (SINGULAIR) 10 MG tablet Take 10 mg by mouth at bedtime.   Yes Historical Provider, MD  Multiple Vitamins-Minerals (CERTAVITE/ANTIOXIDANTS PO) Take 1 capsule by mouth every morning.   Yes Historical Provider, MD  Multiple Vitamins-Minerals (MACULAR VITAMIN BENEFIT PO) Take 1 tablet by mouth every morning.    Yes Historical Provider, MD  omeprazole (PRILOSEC) 20 MG capsule Take 20 mg by mouth every morning.  05/15/12  Yes Historical Provider, MD  Rivaroxaban (XARELTO) 15 MG TABS tablet Take 15 mg by mouth daily.   Yes Historical Provider, MD  sertraline (ZOLOFT) 25 MG tablet Take 25 mg by mouth every evening.   Yes Historical Provider, MD  albuterol (PROAIR HFA) 108 (90 BASE) MCG/ACT inhaler Inhale 2 puffs into the lungs every 6 (six) hours as needed for wheezing. As needed for wheezing 11/16/10   Michele Mcalpine, MD  EPIPEN 2-PAK 0.3 MG/0.3ML DEVI Inject 0.3 mg into the skin once as needed (allergic reaction).  10/18/10   Historical Provider, MD  nitroGLYCERIN (NITROSTAT) 0.4 MG SL tablet Place 0.4 mg under the tongue every 5 (five) minutes as needed for chest pain.     Historical Provider, MD   Physical Exam: Filed Vitals:   11/22/12 1848 11/22/12 2115 11/22/12 2223 11/23/12 0036  BP: 153/68  163/62 172/65  Pulse: 63  92   Temp:   98 F (36.7 C)   TempSrc:   Oral   Resp: 16 17 20    Height:   5\' 1"  (1.549 m)   Weight:   47.4 kg (104 lb 8 oz)   SpO2: 96%  98%      General:  Well-developed well-nourished.  Eyes: Anicteric no pallor.  ENT: No discharge from the ears eyes nose mouth.  Neck: No mass  felt.  Cardiovascular: S1-S2 heard.  Respiratory: No rhonchi or crepitations.  Abdomen: Soft nontender bowel sounds present.  Skin: No rash.  Musculoskeletal: No edema.  Psychiatric: Appears normal.  Neurologic: Alert awake oriented to time place and person. Moves all extremities.  Labs on Admission:  Basic Metabolic Panel:  Recent Labs Lab 11/19/12 1128 11/22/12 1800  NA 142 141  K 4.7 3.5  CL  --  101  CO2 26 24  GLUCOSE 127 83  BUN 17.6 17  CREATININE 0.9 0.74  CALCIUM 10.1 9.9   Liver Function Tests:  Recent Labs Lab 11/19/12 1128 11/22/12 1800  AST 21 23  ALT 14 12  ALKPHOS 77 81  BILITOT 0.36 0.5  PROT 7.0 7.1  ALBUMIN 3.4* 3.7    Recent Labs Lab 11/22/12 1800  LIPASE 16   No  results found for this basename: AMMONIA,  in the last 168 hours CBC:  Recent Labs Lab 11/19/12 1128 11/22/12 1800  WBC 7.0 8.2  NEUTROABS 5.8 6.6  HGB 12.7 13.6  HCT 38.4 41.8  MCV 107.1* 108.0*  PLT 386 347   Cardiac Enzymes:  Recent Labs Lab 11/22/12 2249  TROPONINI <0.30    BNP (last 3 results) No results found for this basename: PROBNP,  in the last 8760 hours CBG:  Recent Labs Lab 11/22/12 2350  GLUCAP 72    Radiological Exams on Admission: Dg Abd Acute W/chest  11/22/2012   CLINICAL DATA:  Epigastric abdominal pain. Nausea and vomiting. Current history of asthma.  EXAM: ACUTE ABDOMEN SERIES (ABDOMEN 2 VIEW & CHEST 1 VIEW)  COMPARISON:  Portable chest x-ray 09/08/2012 and two-view chest x-ray 05/23/2010. No prior abdominal imaging.  FINDINGS: Bowel gas pattern unremarkable without evidence of obstruction or significant ileus. Moderate stool burden throughout the colon. No evidence of free air or significant air-fluid levels on the lateral decubitus image. Extensive aortoiliofemoral cyber poor and visceral artery atherosclerosis without aneurysm. No visible opaque urinary tract calculi. Prior right hip arthroplasty with anatomic alignment.  Degenerative changes involving the lumbar spine with slight lumbar scoliosis convex left.  Cardiac silhouette upper normal in size for AP technique, unchanged. Thoracic aorta atherosclerotic, unchanged. Hilar and mediastinal contours otherwise unremarkable. Linear scarring in the lung bases, unchanged. No new pulmonary parenchymal abnormalities.  IMPRESSION: 1. No acute abdominal abnormality. Moderate stool burden. 2. No acute cardiopulmonary disease. Stable scarring in the lung bases.   Electronically Signed   By: Hulan Saas   On: 11/22/2012 17:34    EKG: Independently reviewed. Normal sinus rhythm with T-wave inversions in V2 to V4 and inferior leads.  Assessment/Plan Principal Problem:   Nausea & vomiting Active Problems:   Essential thrombocythemia   Essential hypertension   DVT (deep venous thrombosis)   Chest pain   1. Nausea vomiting - cause not clear. Suspect esophageal disorder at this time like stricture. We'll keep patient n.p.o. in anticipation of possible GI procedure. Protonix I.V. Gentle hydration. Note that patient is on heparin for her history of DVT. Consult [facility] in a.m. as patient has seen them previously. 2. Chest pain - presently chest pain-free. Cycle cardiac markers. 3. History of essential thrombocythemia and DVT - patient was not able to take her medications today due to vomiting. We'll place patient on heparin infusion until patient can reliably take orally. Heparin is to be discontinued at least 3-4 hours before procedure. 4. Hypertension - since patient is n.p.o. I have placed patient on scheduled dose of IV metoprolol and when necessary IV hydralazine. 5. History of COPD/asthma - presently not wheezing.    Code Status: Full code.  Family Communication: Patient's son at the bedside.  Disposition Plan: Admit to inpatient.    Masato Pettie N. [facility] Pager [phone].  If 7PM-7AM, please contact  night-coverage [email or web address] Password [email or web address] 11/23/2012, 1:06 AM

## 2012-11-23 NOTE — Progress Notes (Signed)
Hypoglycemic Event  CBG: 64  Treatment: D50 IV 25 mL  Symptoms: None  Follow-up CBG: Time:1222 CBG Result:162  Possible Reasons for Event: Inadequate meal intake  Comments/MD notified:Hernandez    Sherri Crawford  Remember to initiate Hypoglycemia Order Set & complete

## 2012-11-23 NOTE — Progress Notes (Signed)
Pharmacy: Heparin consult for DVT  Second heparin level (0.56) this afternoon remains within goal range. No bleeding or issues reported  Plan 1.) Continue heparin at 700 units/hr 2.) f/u AM heparin level   Ted Goodner, Loma Messing PharmD Pager #: [phone] 4:04 PM 11/23/2012

## 2012-11-23 NOTE — Consult Note (Signed)
Cross cover [facility] Reason for Consult: Difficulty swallowing. Referring Physician: THP  Sherri Crawford is an 79 y.o. female.  HPI: Patient gives a 2 weeks history of worsening dysphagia-over the last 5 days she claims she cannot even keep liquids down. Her son who is at the bedside wants an EGD done today. She was Xarelto prior to admission and has just been started on Heparin for a chronic left leg DVT. She also complained of some retrosternal discomfort [as per her son-in-law] but patient denies any such issues at the present time. She has been on Prilosec for reflux. She has dysphagia with both solids and liquids. She claims she usually weighs about 140 lbs but has lost down to 102 lbs in the last four months. She denies having any melena or hematochezia. She has a poor appetite and has not been eating too well lately [as per her son who is also at the bedside]. She apparently had similar symptoms about 2 years ago, when she had a dilatation done but she cannot remember the name of her gastroenterologist.    Past Medical History  Diagnosis Date  . Macular degeneration   . COPD (chronic obstructive pulmonary disease)   . Hypertension   . Coronary artery disease   . Hypercholesterolemia   . GERD (gastroesophageal reflux disease)   . Asthmatic bronchitis   . History of colonic polyps   . DJD (degenerative joint disease)   . Lumbar back pain   . Osteoporosis   . History of transient ischemic attack (TIA)   . Anxiety   . Essential thrombocythemia   . Diverticulosis 05/10/2012  . Osteoarthrosis, unspecified whether generalized or localized, unspecified site 05/09/2012  . Memory loss 05/09/2012  . Abnormal weight gain 05/09/2012  . Phlebitis and thrombophlebitis of other deep vessels of lower extremities 07/10/2010  . Essential thrombocythemia 06/09/2009   Past Surgical History  Procedure Laterality Date  . Appendectomy    . Cholecystectomy    . Tah and bso  08/21/1995  . Cataract extraction  w/ intraocular lens  implant, bilateral    . Abdominal hysterectomy    . Hip arthroplasty Right 09/09/2012    Procedure: ARTHROPLASTY BIPOLAR HIP;  Surgeon: Shelda Pal, MD;  Location: [facility];  Service: Orthopedics;  Laterality: Right;    Family History  Problem Relation Age of Onset  . Colon cancer Son 52  . Crohn's disease Son   . Cancer Maternal Aunt    Social History:  reports that she has never smoked. She has never used smokeless tobacco. She reports that she does not drink alcohol or use illicit drugs.  Allergies:  Allergies  Allergen Reactions  . Sulfonamide Derivatives Other (See Comments)    Pt is unsure of reaction   Medications: I have reviewed the patients medications.  Results for orders placed during the hospital encounter of 11/22/12 (from the past 48 hour(s))  CBC WITH DIFFERENTIAL     Status: Abnormal   Collection Time    11/22/12  6:00 PM      Result Value Range   WBC 8.2  4.0 - 10.5 K/uL   RBC 3.87  3.87 - 5.11 MIL/uL   Hemoglobin 13.6  12.0 - 15.0 g/dL   HCT 16.1  09.6 - 04.5 %   MCV 108.0 (*) 78.0 - 100.0 fL   MCH 35.1 (*) 26.0 - 34.0 pg   MCHC 32.5  30.0 - 36.0 g/dL   RDW 40.9  81.1 - 91.4 %  Platelets 347  150 - 400 K/uL   Neutrophils Relative % 80 (*) 43 - 77 %   Neutro Abs 6.6  1.7 - 7.7 K/uL   Lymphocytes Relative 15  12 - 46 %   Lymphs Abs 1.2  0.7 - 4.0 K/uL   Monocytes Relative 4  3 - 12 %   Monocytes Absolute 0.3  0.1 - 1.0 K/uL   Eosinophils Relative 1  0 - 5 %   Eosinophils Absolute 0.1  0.0 - 0.7 K/uL   Basophils Relative 1  0 - 1 %   Basophils Absolute 0.0  0.0 - 0.1 K/uL  COMPREHENSIVE METABOLIC PANEL     Status: Abnormal   Collection Time    11/22/12  6:00 PM      Result Value Range   Sodium 141  135 - 145 mEq/L   Potassium 3.5  3.5 - 5.1 mEq/L   Chloride 101  96 - 112 mEq/L   CO2 24  19 - 32 mEq/L   Glucose, Bld 83  70 - 99 mg/dL   BUN 17  6 - 23 mg/dL   Creatinine, Ser 4.78  0.50 - 1.10 mg/dL   Calcium 9.9  8.4 - 29.5  mg/dL   Total Protein 7.1  6.0 - 8.3 g/dL   Albumin 3.7  3.5 - 5.2 g/dL   AST 23  0 - 37 U/L   ALT 12  0 - 35 U/L   Alkaline Phosphatase 81  39 - 117 U/L   Total Bilirubin 0.5  0.3 - 1.2 mg/dL   GFR calc non Af Amer 73 (*) >90 mL/min   GFR calc Af Amer 85 (*) >90 mL/min   Comment: (NOTE)     The eGFR has been calculated using the CKD EPI equation.     This calculation has not been validated in all clinical situations.     eGFR's persistently <90 mL/min signify possible Chronic Kidney     Disease.  LIPASE, BLOOD     Status: None   Collection Time    11/22/12  6:00 PM      Result Value Range   Lipase 16  11 - 59 U/L  PROTIME-INR     Status: None   Collection Time    11/22/12  6:00 PM      Result Value Range   Prothrombin Time 14.0  11.6 - 15.2 seconds   INR 1.10  0.00 - 1.49  SAMPLE TO BLOOD BANK     Status: None   Collection Time    11/22/12  6:00 PM      Result Value Range   Blood Bank Specimen SAMPLE AVAILABLE FOR TESTING     Sample Expiration 11/25/2012    TROPONIN I     Status: None   Collection Time    11/22/12 10:49 PM      Result Value Range   Troponin I <0.30  <0.30 ng/mL   Comment:            Due to the release kinetics of cTnI,     a negative result within the first hours     of the onset of symptoms does not rule out     myocardial infarction with certainty.     If myocardial infarction is still suspected,     repeat the test at appropriate intervals.  GLUCOSE, CAPILLARY     Status: None   Collection Time    11/22/12 11:50 PM  Result Value Range   Glucose-Capillary 72  70 - 99 mg/dL   Comment 1 Documented in Chart     Comment 2 Notify RN    TROPONIN I     Status: None   Collection Time    11/23/12  4:35 AM      Result Value Range   Troponin I <0.30  <0.30 ng/mL   Comment:            Due to the release kinetics of cTnI,     a negative result within the first hours     of the onset of symptoms does not rule out     myocardial infarction with  certainty.     If myocardial infarction is still suspected,     repeat the test at appropriate intervals.  COMPREHENSIVE METABOLIC PANEL     Status: Abnormal   Collection Time    11/23/12  4:35 AM      Result Value Range   Sodium 143  135 - 145 mEq/L   Potassium 3.6  3.5 - 5.1 mEq/L   Chloride 106  96 - 112 mEq/L   CO2 22  19 - 32 mEq/L   Glucose, Bld 69 (*) 70 - 99 mg/dL   BUN 14  6 - 23 mg/dL   Creatinine, Ser 5.78  0.50 - 1.10 mg/dL   Calcium 8.5  8.4 - 46.9 mg/dL   Total Protein 6.1  6.0 - 8.3 g/dL   Albumin 3.0 (*) 3.5 - 5.2 g/dL   AST 22  0 - 37 U/L   ALT 11  0 - 35 U/L   Alkaline Phosphatase 73  39 - 117 U/L   Total Bilirubin 0.5  0.3 - 1.2 mg/dL   GFR calc non Af Amer 73 (*) >90 mL/min   GFR calc Af Amer 84 (*) >90 mL/min   Comment: (NOTE)     The eGFR has been calculated using the CKD EPI equation.     This calculation has not been validated in all clinical situations.     eGFR's persistently <90 mL/min signify possible Chronic Kidney     Disease.  CBC WITH DIFFERENTIAL     Status: Abnormal   Collection Time    11/23/12  4:35 AM      Result Value Range   WBC 8.4  4.0 - 10.5 K/uL   RBC 3.55 (*) 3.87 - 5.11 MIL/uL   Hemoglobin 12.4  12.0 - 15.0 g/dL   HCT 62.9  52.8 - 41.3 %   MCV 110.1 (*) 78.0 - 100.0 fL   MCH 34.9 (*) 26.0 - 34.0 pg   MCHC 31.7  30.0 - 36.0 g/dL   RDW 24.4  01.0 - 27.2 %   Platelets 316  150 - 400 K/uL   Neutrophils Relative % 83 (*) 43 - 77 %   Neutro Abs 6.9  1.7 - 7.7 K/uL   Lymphocytes Relative 12  12 - 46 %   Lymphs Abs 1.0  0.7 - 4.0 K/uL   Monocytes Relative 4  3 - 12 %   Monocytes Absolute 0.3  0.1 - 1.0 K/uL   Eosinophils Relative 1  0 - 5 %   Eosinophils Absolute 0.1  0.0 - 0.7 K/uL   Basophils Relative 0  0 - 1 %   Basophils Absolute 0.0  0.0 - 0.1 K/uL  GLUCOSE, CAPILLARY     Status: None   Collection Time    11/23/12  6:55 AM  Result Value Range   Glucose-Capillary 72  70 - 99 mg/dL   Comment 1 Documented in Chart      Comment 2 Notify RN    HEPARIN LEVEL (UNFRACTIONATED)     Status: None   Collection Time    11/23/12  7:30 AM      Result Value Range   Heparin Unfractionated 0.52  0.30 - 0.70 IU/mL   Comment:            IF HEPARIN RESULTS ARE BELOW     EXPECTED VALUES, AND PATIENT     DOSAGE HAS BEEN CONFIRMED,     SUGGEST FOLLOW UP TESTING     OF ANTITHROMBIN III LEVELS.  CBC     Status: Abnormal   Collection Time    11/23/12  7:30 AM      Result Value Range   WBC 7.3  4.0 - 10.5 K/uL   RBC 3.50 (*) 3.87 - 5.11 MIL/uL   Hemoglobin 12.4  12.0 - 15.0 g/dL   HCT 40.9  81.1 - 91.4 %   MCV 109.7 (*) 78.0 - 100.0 fL   MCH 35.4 (*) 26.0 - 34.0 pg   MCHC 32.3  30.0 - 36.0 g/dL   RDW 78.2  95.6 - 21.3 %   Platelets 305  150 - 400 K/uL  TROPONIN I     Status: None   Collection Time    11/23/12 10:10 AM      Result Value Range   Troponin I <0.30  <0.30 ng/mL   Comment:            Due to the release kinetics of cTnI,     a negative result within the first hours     of the onset of symptoms does not rule out     myocardial infarction with certainty.     If myocardial infarction is still suspected,     repeat the test at appropriate intervals.  GLUCOSE, CAPILLARY     Status: Abnormal   Collection Time    11/23/12 11:41 AM      Result Value Range   Glucose-Capillary 64 (*) 70 - 99 mg/dL  GLUCOSE, CAPILLARY     Status: Abnormal   Collection Time    11/23/12 12:22 PM      Result Value Range   Glucose-Capillary 162 (*) 70 - 99 mg/dL  HEPARIN LEVEL (UNFRACTIONATED)     Status: None   Collection Time    11/23/12  3:05 PM      Result Value Range   Heparin Unfractionated 0.56  0.30 - 0.70 IU/mL   Comment:            IF HEPARIN RESULTS ARE BELOW     EXPECTED VALUES, AND PATIENT     DOSAGE HAS BEEN CONFIRMED,     SUGGEST FOLLOW UP TESTING     OF ANTITHROMBIN III LEVELS.    Dg Abd Acute W/chest  11/22/2012   CLINICAL DATA:  Epigastric abdominal pain. Nausea and vomiting. Current history of  asthma.  EXAM: ACUTE ABDOMEN SERIES (ABDOMEN 2 VIEW & CHEST 1 VIEW)  COMPARISON:  Portable chest x-ray 09/08/2012 and two-view chest x-ray 05/23/2010. No prior abdominal imaging.  FINDINGS: Bowel gas pattern unremarkable without evidence of obstruction or significant ileus. Moderate stool burden throughout the colon. No evidence of free air or significant air-fluid levels on the lateral decubitus image. Extensive aortoiliofemoral cyber poor and visceral artery atherosclerosis without aneurysm. No visible opaque urinary tract calculi.  Prior right hip arthroplasty with anatomic alignment. Degenerative changes involving the lumbar spine with slight lumbar scoliosis convex left.  Cardiac silhouette upper normal in size for AP technique, unchanged. Thoracic aorta atherosclerotic, unchanged. Hilar and mediastinal contours otherwise unremarkable. Linear scarring in the lung bases, unchanged. No new pulmonary parenchymal abnormalities.  IMPRESSION: 1. No acute abdominal abnormality. Moderate stool burden. 2. No acute cardiopulmonary disease. Stable scarring in the lung bases.   Electronically Signed   By: Hulan Saas   On: 11/22/2012 17:34    Review of Systems  Constitutional: Negative.   HENT: Negative.  Negative for neck pain.   Eyes: Positive for blurred vision. Negative for pain and discharge.  Respiratory: Negative.   Cardiovascular: Negative for chest pain, palpitations, orthopnea, claudication, leg swelling and PND.  Gastrointestinal: Positive for heartburn and vomiting. Negative for nausea, abdominal pain, diarrhea and constipation.  Genitourinary: Negative.   Musculoskeletal: Positive for back pain and joint pain. Negative for myalgias and falls.  Skin: Negative.   Neurological: Negative.   Psychiatric/Behavioral: Positive for depression. Negative for suicidal ideas, hallucinations, memory loss and substance abuse. The patient is nervous/anxious. The patient does not have insomnia.    Blood  pressure 137/47, pulse 89, temperature 97.5 F (36.4 C), temperature source Oral, resp. rate 18, height 5\' 1"  (1.549 m), weight 47.4 kg (104 lb 8 oz), SpO2 96.00%. Physical Exam  Constitutional: She is oriented to person, place, and time. She appears well-developed and well-nourished.  HENT:  Head: Normocephalic and atraumatic.  Eyes: Conjunctivae are normal. Pupils are equal, round, and reactive to light.  Neck: Normal range of motion. Neck supple.  Cardiovascular: Normal rate and regular rhythm.   Respiratory: Effort normal and breath sounds normal.  GI: Soft. Bowel sounds are normal.  Musculoskeletal: Normal range of motion.  Neurological: She is alert and oriented to person, place, and time.  Skin: Skin is warm and dry.  Psychiatric: She has a normal mood and affect. Her behavior is normal. Judgment and thought content normal.   Assessment/Plan: 1) Dysphagia for solids and liquids/GERD/Esophageal dysmotility noted on a barium swallow in 2012-she will need an EGD with possible dilatation but as she has been on anticoagulants [for DVT], and is just being started on Heparin, plans are to have her evaluated by Dr. Rhea Belton on Monday. He can finalize the timing of her EGD after his evaluation. 2) Abnormal weight loss-rule out malignancy.   3) Diverticulosis.  4) Left leg DVT-was on Xarelto PTA.  5) COPD/Asthma. 6) CAD/HTN/Hyperlipidemia. 7) DDD/Back pain/Osteoporosis.  8) Macular degeneration. Sherri Crawford 11/23/2012, 4:01 PM

## 2012-11-24 DIAGNOSIS — K222 Esophageal obstruction: Principal | ICD-10-CM

## 2012-11-24 DIAGNOSIS — E43 Unspecified severe protein-calorie malnutrition: Secondary | ICD-10-CM | POA: Insufficient documentation

## 2012-11-24 LAB — CBC
HCT: 36.2 % (ref 36.0–46.0)
MCH: 34.9 pg — ABNORMAL HIGH (ref 26.0–34.0)
MCV: 108.1 fL — ABNORMAL HIGH (ref 78.0–100.0)
Platelets: 280 10*3/uL (ref 150–400)
RBC: 3.35 MIL/uL — ABNORMAL LOW (ref 3.87–5.11)
RDW: 14.7 % (ref 11.5–15.5)

## 2012-11-24 LAB — GLUCOSE, CAPILLARY

## 2012-11-24 MED ORDER — SODIUM CHLORIDE 0.9 % IV SOLN: INTRAVENOUS | Status: DC

## 2012-11-24 MED ORDER — HYDROCODONE-ACETAMINOPHEN 5-325 MG PO TABS
1.0000 | ORAL_TABLET | Freq: Four times a day (QID) | ORAL | Status: DC | PRN
  Administered 2012-11-24 – 2012-11-26 (×4): 1 via ORAL
  Filled 2012-11-24 (×4): qty 1

## 2012-11-24 MED ORDER — BOOST / RESOURCE BREEZE PO LIQD
1.0000 | Freq: Three times a day (TID) | ORAL | Status: DC
  Administered 2012-11-24 – 2012-11-25 (×2): 1 via ORAL

## 2012-11-24 NOTE — Progress Notes (Signed)
[facility] PROGRESS NOTE  Sherri Crawford:[id number] DOB: 05-03-1920 DOA: 11/22/2012 PCP: Sherri Relic, MD  Assessment/Plan: Regurgitation of Food -With h/o esophageal stricture. -Will likely need dilatation this admission. -GI following. -Xarelto has been discontinued since admission to allow for EGD and has been started on heparin which can be turned off hours prior to procedure.  Chest Pain -Has been CP free since admission. -All troponins have been negative.  H/o DVT -As above, xarelto has been discontinued and is on a heparin drip to allow for EGD.   Code Status: Full Code Family Communication: Patient only today.  Disposition Plan: Home when medically stable and once GI has finalized plans.   Consultants:  GI    Antibiotics:  None   Subjective: Has tolerated some clears. Has had 3 BMs overnight.  Objective: Filed Vitals:   11/23/12 1946 11/23/12 2120 11/24/12 0108 11/24/12 0600  BP:  143/52 152/58 153/54  Pulse:  82 81 79  Temp:  98.2 F (36.8 C)  98 F (36.7 C)  TempSrc:  Oral  Oral  Resp:  18  18  Height:      Weight:      SpO2: 95% 95%  96%    Intake/Output Summary (Last 24 hours) at 11/24/12 0929 Last data filed at 11/24/12 0700  Gross per 24 hour  Intake 1176.68 ml  Output    250 ml  Net 926.68 ml   Filed Weights   11/22/12 2223  Weight: 47.4 kg (104 lb 8 oz)    Exam:   General:  AA Ox3  Cardiovascular: RRR  Respiratory: CTA B  Abdomen: S/NT/ND/+BS  Extremities: no C/C/E   Neurologic:  Non-focal  Data Reviewed: Basic Metabolic Panel:  Recent Labs Lab 11/19/12 1128 11/22/12 1800 11/23/12 0435  NA 142 141 143  K 4.7 3.5 3.6  CL  --  101 106  CO2 26 24 22   GLUCOSE 127 83 69*  BUN 17.6 17 14   CREATININE 0.9 0.74 0.75  CALCIUM 10.1 9.9 8.5   Liver Function Tests:  Recent Labs Lab 11/19/12 1128 11/22/12 1800 11/23/12 0435  AST 21 23 22   ALT 14 12 11   ALKPHOS 77 81 73  BILITOT 0.36 0.5 0.5   PROT 7.0 7.1 6.1  ALBUMIN 3.4* 3.7 3.0*    Recent Labs Lab 11/22/12 1800  LIPASE 16   No results found for this basename: AMMONIA,  in the last 168 hours CBC:  Recent Labs Lab 11/19/12 1128 11/22/12 1800 11/23/12 0435 11/23/12 0730 11/24/12 0430  WBC 7.0 8.2 8.4 7.3 10.3  NEUTROABS 5.8 6.6 6.9  --   --   HGB 12.7 13.6 12.4 12.4 11.7*  HCT 38.4 41.8 39.1 38.4 36.2  MCV 107.1* 108.0* 110.1* 109.7* 108.1*  PLT 386 347 316 305 280   Cardiac Enzymes:  Recent Labs Lab 11/22/12 2249 11/23/12 0435 11/23/12 1010  TROPONINI <0.30 <0.30 <0.30   BNP (last 3 results) No results found for this basename: PROBNP,  in the last 8760 hours CBG:  Recent Labs Lab 11/23/12 1141 11/23/12 1222 11/23/12 1832 11/24/12 0007 11/24/12 0558  GLUCAP 64* 162* 141* 106* 96    No results found for this or any previous visit (from the past 240 hour(s)).   Studies: Dg Abd Acute W/chest  11/22/2012   CLINICAL DATA:  Epigastric abdominal pain. Nausea and vomiting. Current history of asthma.  EXAM: ACUTE ABDOMEN SERIES (ABDOMEN 2 VIEW & CHEST 1 VIEW)  COMPARISON:  Portable chest x-ray  09/08/2012 and two-view chest x-ray 05/23/2010. No prior abdominal imaging.  FINDINGS: Bowel gas pattern unremarkable without evidence of obstruction or significant ileus. Moderate stool burden throughout the colon. No evidence of free air or significant air-fluid levels on the lateral decubitus image. Extensive aortoiliofemoral cyber poor and visceral artery atherosclerosis without aneurysm. No visible opaque urinary tract calculi. Prior right hip arthroplasty with anatomic alignment. Degenerative changes involving the lumbar spine with slight lumbar scoliosis convex left.  Cardiac silhouette upper normal in size for AP technique, unchanged. Thoracic aorta atherosclerotic, unchanged. Hilar and mediastinal contours otherwise unremarkable. Linear scarring in the lung bases, unchanged. No new pulmonary parenchymal  abnormalities.  IMPRESSION: 1. No acute abdominal abnormality. Moderate stool burden. 2. No acute cardiopulmonary disease. Stable scarring in the lung bases.   Electronically Signed   By: Hulan Saas   On: 11/22/2012 17:34    Scheduled Meds: . metoprolol  2.5 mg Intravenous Q6H  . mometasone-formoterol  2 puff Inhalation BID  . pantoprazole (PROTONIX) IV  40 mg Intravenous QHS  . sodium chloride  3 mL Intravenous Q12H   Continuous Infusions: . heparin 700 Units/hr (11/24/12 9147)    Principal Problem:   Nausea & vomiting Active Problems:   Essential thrombocythemia   Essential hypertension   DVT (deep venous thrombosis)   Chest pain    Time spent: 35 minutes    Sherri Crawford,Sherri Crawford  [facility] Pager [phone]  If 7PM-7AM, please contact night-coverage at [email or web address], password [facility] 11/24/2012, 9:29 AM  LOS: 2 days

## 2012-11-24 NOTE — Progress Notes (Signed)
Cross cover [facility] Subjective: Since I last evaluated the patient, she seems to be tolerating liquids well. Very anxious about having an EGD done to have a dilatation done. She was not done over the weekend as she was on Xaralto PTA and has been on Heparin in the hospital She denies having any abdominal pain, nausea or vomiting.    Objective: Vital signs in last 24 hours: Temp:  [97.5 F (36.4 C)-98.2 F (36.8 C)] 98 F (36.7 C) (09/28 0600) Pulse Rate:  [79-89] 86 (09/28 1201) Resp:  [18] 18 (09/28 0600) BP: (137-178)/(47-74) 178/71 mmHg (09/28 1201) SpO2:  [95 %-96 %] 96 % (09/28 0600) Last BM Date: 11/24/12  Intake/Output from previous day: 09/27 0701 - 09/28 0700 In: 1692.4 [P.O.:180; I.V.:1512.4] Out: 450 [Urine:450] Intake/Output this shift: Total I/O In: 240 [P.O.:240] Out: -   General appearance: alert, cooperative, appears older than stated age, no distress and mildly obese Resp: clear to auscultation bilaterally Cardio: regular rate and rhythm, S1, S2 normal, no murmur, click, rub or gallop GI: soft, non-tender; bowel sounds normal; no masses,  no organomegaly Extremities: extremities normal, atraumatic, no cyanosis or edema  Lab Results:  Recent Labs  11/23/12 0435 11/23/12 0730 11/24/12 0430  WBC 8.4 7.3 10.3  HGB 12.4 12.4 11.7*  HCT 39.1 38.4 36.2  PLT 316 305 280   BMET  Recent Labs  11/22/12 1800 11/23/12 0435  NA 141 143  K 3.5 3.6  CL 101 106  CO2 24 22  GLUCOSE 83 69*  BUN 17 14  CREATININE 0.74 0.75  CALCIUM 9.9 8.5   LFT  Recent Labs  11/23/12 0435  PROT 6.1  ALBUMIN 3.0*  AST 22  ALT 11  ALKPHOS 73  BILITOT 0.5   PT/INR  Recent Labs  11/22/12 1800  LABPROT 14.0  INR 1.10   Studies/Results: Dg Abd Acute W/chest  11/22/2012   CLINICAL DATA:  Epigastric abdominal pain. Nausea and vomiting. Current history of asthma.  EXAM: ACUTE ABDOMEN SERIES (ABDOMEN 2 VIEW & CHEST 1 VIEW)  COMPARISON:  Portable chest x-ray  09/08/2012 and two-view chest x-ray 05/23/2010. No prior abdominal imaging.  FINDINGS: Bowel gas pattern unremarkable without evidence of obstruction or significant ileus. Moderate stool burden throughout the colon. No evidence of free air or significant air-fluid levels on the lateral decubitus image. Extensive aortoiliofemoral cyber poor and visceral artery atherosclerosis without aneurysm. No visible opaque urinary tract calculi. Prior right hip arthroplasty with anatomic alignment. Degenerative changes involving the lumbar spine with slight lumbar scoliosis convex left.  Cardiac silhouette upper normal in size for AP technique, unchanged. Thoracic aorta atherosclerotic, unchanged. Hilar and mediastinal contours otherwise unremarkable. Linear scarring in the lung bases, unchanged. No new pulmonary parenchymal abnormalities.  IMPRESSION: 1. No acute abdominal abnormality. Moderate stool burden. 2. No acute cardiopulmonary disease. Stable scarring in the lung bases.   Electronically Signed   By: Hulan Saas   On: 11/22/2012 17:34   Medications: I have reviewed the patient's current medications.  Assessment/Plan: 1) Dysphagia with a history of esophageal stricture [dialted last about 2 years ago]/GERD/Nausea and vomiting PTA/Abnormal weight loss/history of presbyesophagus in the chart: Will keep NPO tonight for an EGD tomorrow and let Dr. Rhea Belton decide on the timing of when the Heparin needs to be held before the EGD. Continue present care. 2) DVT LLL; On Heparin.   LOS: 2 days   Sherri Crawford 11/24/2012, 2:51 PM

## 2012-11-24 NOTE — Progress Notes (Signed)
INITIAL NUTRITION ASSESSMENT  DOCUMENTATION CODES Per approved criteria  -Severe malnutrition in the context of chronic illness  Pt meets criteria for severe MALNUTRITION in the context of chronic illness as evidenced by reported intake <75% for > 1 month and reported weight loss of >7.5% in 3 months. INTERVENTION: - Resource Breeze po TID, each supplement provides 250 kcal and 9 grams of protein. - once diet advanced, recommend Ensure Complete po BID, each supplement provides 350 kcal and 13 grams of protein.  NUTRITION DIAGNOSIS: Inadequate oral intake related to esophageal stricture as evidenced by reported 10 lb weight loss.   Goal: Pt to meet >/= 90% of their estimated nutrition needs   Monitor:  Weight, po intake, labs, acceptance of supplements  Reason for Assessment: MST  79 y.o. female  Admitting Dx: Nausea & vomiting  ASSESSMENT: Pt with PMH of myelodysplastic syndrome (essential thrombocytopenia), DVT, hypertension, and COPD admitted to hospital with persistent vomiting and inability to eat over the past 2 days.  Pt to undergo dilation of esophageal stricture tomorrow. Per RN and pt, pt has done well with clear liquid diet. Pt will be sent Resource Breeze which she was advised to drink first at meals. Once diet upgraded, pt will be sent Ensure Plus.  Pt and son report pt having a weight loss of 10 lbs recently which reflects a 9% weight loss.  Height: Ht Readings from Last 1 Encounters:  11/22/12 5\' 1"  (1.549 m)    Weight: Wt Readings from Last 1 Encounters:  11/22/12 104 lb 8 oz (47.4 kg)    Ideal Body Weight: 47.8 kg  % Ideal Body Weight: 99%  Wt Readings from Last 10 Encounters:  11/22/12 104 lb 8 oz (47.4 kg)  11/19/12 104 lb 6.4 oz (47.356 kg)  09/08/12 111 lb 15.9 oz (50.8 kg)  09/08/12 111 lb 15.9 oz (50.8 kg)  08/08/12 112 lb (50.803 kg)  07/16/12 112 lb 12.8 oz (51.166 kg)  05/27/12 114 lb (51.71 kg)  03/19/12 97 lb 1.6 oz (44.044 kg)   03/12/12 96 lb 12.8 oz (43.908 kg)  01/16/12 99 lb (44.906 kg)    Usual Body Weight: 114 lbs  % Usual Body Weight: 91%  BMI:  Body mass index is 19.75 kg/(m^2).  Estimated Nutritional Needs: Kcal: 1400-1650 Protein: 60-70 g Fluid: >1.7 L  Skin: WNL  Diet Order: Clear Liquid  EDUCATION NEEDS: -No education needs identified at this time   Intake/Output Summary (Last 24 hours) at 11/24/12 1352 Last data filed at 11/24/12 0849  Gross per 24 hour  Intake 1416.68 ml  Output    250 ml  Net 1166.68 ml    Last BM: 9/28   Labs:   Recent Labs Lab 11/19/12 1128 11/22/12 1800 11/23/12 0435  NA 142 141 143  K 4.7 3.5 3.6  CL  --  101 106  CO2 26 24 22   BUN 17.6 17 14   CREATININE 0.9 0.74 0.75  CALCIUM 10.1 9.9 8.5  GLUCOSE 127 83 69*    CBG (last 3)   Recent Labs  11/23/12 1832 11/24/12 0007 11/24/12 0558  GLUCAP 141* 106* 96    Scheduled Meds: . metoprolol  2.5 mg Intravenous Q6H  . mometasone-formoterol  2 puff Inhalation BID  . pantoprazole (PROTONIX) IV  40 mg Intravenous QHS  . sodium chloride  3 mL Intravenous Q12H    Continuous Infusions: . heparin 700 Units/hr (11/24/12 1610)    Past Medical History  Diagnosis Date  . Macular degeneration   .  COPD (chronic obstructive pulmonary disease)   . Hypertension   . Coronary artery disease   . Hypercholesterolemia   . GERD (gastroesophageal reflux disease)   . Asthmatic bronchitis   . History of colonic polyps   . DJD (degenerative joint disease)   . Lumbar back pain   . Osteoporosis   . History of transient ischemic attack (TIA)   . Anxiety   . Essential thrombocythemia   . Diverticulosis 05/10/2012  . Osteoarthrosis, unspecified whether generalized or localized, unspecified site 05/09/2012  . Memory loss 05/09/2012  . Abnormal weight gain 05/09/2012  . Phlebitis and thrombophlebitis of other deep vessels of lower extremities 07/10/2010  . Essential thrombocythemia 06/09/2009    Past  Surgical History  Procedure Laterality Date  . Appendectomy    . Cholecystectomy    . Tah and bso  08/21/1995  . Cataract extraction w/ intraocular lens  implant, bilateral    . Abdominal hysterectomy    . Hip arthroplasty Right 09/09/2012    Procedure: ARTHROPLASTY BIPOLAR HIP;  Surgeon: Shelda Pal, MD;  Location: [facility];  Service: Orthopedics;  Laterality: Right;    Ebbie Latus RD, LDN

## 2012-11-24 NOTE — Progress Notes (Signed)
ANTICOAGULATION CONSULT NOTE - Follow up  Pharmacy Consult for Heparin Indication: DVT  Allergies  Allergen Reactions  . Sulfonamide Derivatives Other (See Comments)    Pt is unsure of reaction   Patient Measurements: Height: 5\' 1"  (154.9 cm) Weight: 104 lb 8 oz (47.4 kg) IBW/kg (Calculated) : 47.8 Heparin Dosing Weight: 47 kg  Vital Signs: Temp: 98 F (36.7 C) (09/28 0600) Temp src: Oral (09/28 0600) BP: 153/54 mmHg (09/28 0600) Pulse Rate: 79 (09/28 0600)  Labs:  Recent Labs  11/22/12 1800 11/22/12 2249 11/23/12 0435 11/23/12 0730 11/23/12 1010 11/23/12 1505 11/24/12 0430  HGB 13.6  --  12.4 12.4  --   --  11.7*  HCT 41.8  --  39.1 38.4  --   --  36.2  PLT 347  --  316 305  --   --  280  LABPROT 14.0  --   --   --   --   --   --   INR 1.10  --   --   --   --   --   --   HEPARINUNFRC  --   --   --  0.52  --  0.56 0.42  CREATININE 0.74  --  0.75  --   --   --   --   TROPONINI  --  <0.30 <0.30  --  <0.30  --   --    Estimated Creatinine Clearance: 35.7 ml/min (by C-G formula based on Cr of 0.75).  Assessment: 79 year old female with h/o myeloproliferative disorder-essential thrombocythemia and DVT (May 2012 suspected due to essential thrombocythemia).  On Xarelto 15mg  daily PTA. Presented 9/26 with vomiting. Has a hx of esophageal stricture. Xarelto on hold pending evaluation and IV heparin began while off Xarelto.  Heparin level in target range on 700units/hr.  CBC ok. No bleeding reported/documented.  Goal of Therapy:  Heparin level 0.3-0.7 units/ml Monitor platelets by anticoagulation protocol: Yes   Plan:   Continue heparin at 700 units/hr.  Daily heparin level & CBC while on heparin.  Charolotte Eke, PharmD, pager [phone]. 11/24/2012,7:15 AM.

## 2012-11-25 ENCOUNTER — Encounter (HOSPITAL_COMMUNITY)
Admission: EM | Disposition: A | Discharge status: Skilled Nursing Facility | Payer: Self-pay | Source: Home / Self Care | Attending: Internal Medicine

## 2012-11-25 ENCOUNTER — Encounter (HOSPITAL_COMMUNITY): Payer: Self-pay | Admitting: *Deleted

## 2012-11-25 DIAGNOSIS — R1310 Dysphagia, unspecified: Secondary | ICD-10-CM

## 2012-11-25 DIAGNOSIS — D62 Acute posthemorrhagic anemia: Secondary | ICD-10-CM

## 2012-11-25 HISTORY — PX: ESOPHAGOGASTRODUODENOSCOPY (EGD) WITH ESOPHAGEAL DILATION: SHX5812

## 2012-11-25 LAB — HEPARIN LEVEL (UNFRACTIONATED): Heparin Unfractionated: 0.45 IU/mL (ref 0.30–0.70)

## 2012-11-25 LAB — CBC
Hemoglobin: 11.5 g/dL — ABNORMAL LOW (ref 12.0–15.0)
MCH: 34.3 pg — ABNORMAL HIGH (ref 26.0–34.0)
MCHC: 32 g/dL (ref 30.0–36.0)
Platelets: 269 10*3/uL (ref 150–400)
RBC: 3.35 MIL/uL — ABNORMAL LOW (ref 3.87–5.11)
WBC: 7.5 10*3/uL (ref 4.0–10.5)

## 2012-11-25 SURGERY — ESOPHAGOGASTRODUODENOSCOPY (EGD) WITH ESOPHAGEAL DILATION
Anesthesia: Moderate Sedation

## 2012-11-25 MED ORDER — RIVAROXABAN 20 MG PO TABS
20.0000 mg | ORAL_TABLET | Freq: Every day | ORAL | Status: DC
  Administered 2012-11-26: 20 mg via ORAL
  Filled 2012-11-25 (×2): qty 1

## 2012-11-25 MED ORDER — BUTAMBEN-TETRACAINE-BENZOCAINE 2-2-14 % EX AERO
INHALATION_SPRAY | CUTANEOUS | Status: DC | PRN
  Administered 2012-11-25: 1 via TOPICAL

## 2012-11-25 MED ORDER — SODIUM CHLORIDE 0.9 % IV SOLN: INTRAVENOUS | Status: DC

## 2012-11-25 MED ORDER — FENTANYL CITRATE 0.05 MG/ML IJ SOLN
INTRAMUSCULAR | Status: DC | PRN
  Administered 2012-11-25: 10 ug via INTRAVENOUS
  Administered 2012-11-25: 15 ug via INTRAVENOUS
  Administered 2012-11-25: 25 ug via INTRAVENOUS

## 2012-11-25 MED ORDER — MIDAZOLAM HCL 10 MG/2ML IJ SOLN
INTRAMUSCULAR | Status: DC | PRN
  Administered 2012-11-25 (×2): 2 mg via INTRAVENOUS
  Administered 2012-11-25: 1 mg via INTRAVENOUS

## 2012-11-25 NOTE — Progress Notes (Signed)
Patient has arrived back from EGD procedure in endo. Patient stable, no complaints. VS stable. Son at bedside. Will continue to monitor. Stanton Kidney R

## 2012-11-25 NOTE — Interval H&P Note (Signed)
History and Physical Interval Note: Pt with hx of dysphagia, which she reports has been worsened over the last week.  Solids and liquids, but she thinks solids a bit more Regurgitation of food and fluids, again worse last week Had hard time keeping anything down, query food impaction.  Swallowing liquids now, hasn't really tried solids On Xarelto though off since Friday, heparin gtt off x 6 hours Had EGD previously with Dr. Juanda Chance, savary dilation to 16 mm.  Pt and son report this seem to "really help" The nature of the procedure, as well as the risks, benefits, and alternatives were carefully and thoroughly reviewed with the patient. Ample time for discussion and questions allowed. The patient understood, was satisfied, and agreed to proceed.  Issue now likely multifactorial with dysmotility contributing.  Will rule out other causes, repeat dilation may help.  We discussed repeat dilation today and she wishes to proceed.    11/25/2012 3:24 PM  Sherri Crawford  has presented today for surgery, with the diagnosis of Dysphagia  The various methods of treatment have been discussed with the patient and family. After consideration of risks, benefits and other options for treatment, the patient has consented to  Procedure(s): ESOPHAGOGASTRODUODENOSCOPY (EGD) WITH ESOPHAGEAL DILATION (N/A) as a surgical intervention .  The patient's history has been reviewed, patient examined, no change in status, stable for surgery.  I have reviewed the patient's chart and labs.  Questions were answered to the patient's satisfaction.     Erek Kowal M

## 2012-11-25 NOTE — Progress Notes (Signed)
ANTICOAGULATION CONSULT NOTE - Follow up  Pharmacy Consult for Heparin Indication: DVT  Allergies  Allergen Reactions  . Sulfonamide Derivatives Other (See Comments)    Pt is unsure of reaction   Patient Measurements: Height: 5\' 1"  (154.9 cm) Weight: 104 lb 8 oz (47.4 kg) IBW/kg (Calculated) : 47.8 Heparin Dosing Weight: 47 kg  Vital Signs: Temp: 98.1 F (36.7 C) (09/29 0619) Temp src: Oral (09/29 0619) BP: 152/58 mmHg (09/29 0619) Pulse Rate: 78 (09/29 0619)  Labs:  Recent Labs  11/22/12 1800 11/22/12 2249 11/23/12 0435  11/23/12 0730 11/23/12 1010 11/23/12 1505 11/24/12 0430 11/25/12 0505  HGB 13.6  --  12.4  --  12.4  --   --  11.7* 11.5*  HCT 41.8  --  39.1  --  38.4  --   --  36.2 35.9*  PLT 347  --  316  --  305  --   --  280 269  LABPROT 14.0  --   --   --   --   --   --   --   --   INR 1.10  --   --   --   --   --   --   --   --   HEPARINUNFRC  --   --   --   < > 0.52  --  0.56 0.42 0.45  CREATININE 0.74  --  0.75  --   --   --   --   --   --   TROPONINI  --  <0.30 <0.30  --   --  <0.30  --   --   --   < > = values in this interval not displayed. Estimated Creatinine Clearance: 35.7 ml/min (by C-G formula based on Cr of 0.75).  Assessment: 79 year old female with h/o myeloproliferative disorder-essential thrombocythemia and DVT (May 2012 suspected due to essential thrombocythemia).  On Xarelto 15mg  daily PTA. Presented 9/26 with vomiting. Has a hx of esophageal stricture. Xarelto on hold pending evaluation and IV heparin began while off Xarelto.  Heparin level (0.45) in target range on 700units/hr.  CBC stable. No bleeding reported/documented.  Goal of Therapy:  Heparin level 0.3-0.7 units/ml Monitor platelets by anticoagulation protocol: Yes   Plan:   Continue heparin IV infusion at 700 units/hr.  Daily heparin level & CBC while on heparin.  Noted plans for EGD w/ dilation.  MD to determine when heparin will need to be held.  Pharmacy will f/u  resumption post-procedure.   Lynann Beaver PharmD, BCPS Pager [phone] 11/25/2012 7:37 AM

## 2012-11-25 NOTE — Progress Notes (Signed)
[facility] PROGRESS NOTE  Sherri Crawford ZOX:[id number] DOB: [birth year] DOA: 11/22/2012 PCP: Kimber Relic, MD  Assessment/Plan: Regurgitation of Food -With h/o esophageal stricture. -Will likely need dilatation this admission. -GI following. -Xarelto has been discontinued since admission to allow for EGD and has been started on heparin which can be turned off hours prior to procedure.  Chest Pain -Has been CP free since admission. -All troponins have been negative.  H/o DVT -As above, xarelto has been discontinued and is on a heparin drip to allow for EGD.   Code Status: Full Code Family Communication: Patient only today.  Disposition Plan: Back to SNF when medically stable and once GI has finalized plans.   Consultants:  GI    Antibiotics:  None   Subjective: Has tolerated some clears. Has had 3 BMs overnight.  Objective: Filed Vitals:   11/25/12 0300 11/25/12 0619 11/25/12 0810 11/25/12 1453  BP:  152/58  167/79  Pulse: 85 78  85  Temp:  98.1 F (36.7 C)  99 F (37.2 C)  TempSrc:  Oral  Oral  Resp: 20 20  25   Height:      Weight:    46.465 kg (102 lb 7 oz)  SpO2: 97% 100% 100% 99%    Intake/Output Summary (Last 24 hours) at 11/25/12 1518 Last data filed at 11/25/12 0904  Gross per 24 hour  Intake 364.79 ml  Output    950 ml  Net -585.21 ml   Filed Weights   11/22/12 2223 11/25/12 1453  Weight: 47.4 kg (104 lb 8 oz) 46.465 kg (102 lb 7 oz)    Exam:   General:  AA Ox3  Cardiovascular: RRR  Respiratory: CTA B  Abdomen: S/NT/ND/+BS  Extremities: no C/C/E   Neurologic:  Non-focal  Data Reviewed: Basic Metabolic Panel:  Recent Labs Lab 11/19/12 1128 11/22/12 1800 11/23/12 0435  NA 142 141 143  K 4.7 3.5 3.6  CL  --  101 106  CO2 26 24 22   GLUCOSE 127 83 69*  BUN 17.6 17 14   CREATININE 0.9 0.74 0.75  CALCIUM 10.1 9.9 8.5   Liver Function Tests:  Recent Labs Lab 11/19/12 1128 11/22/12 1800 11/23/12 0435  AST  21 23 22   ALT 14 12 11   ALKPHOS 77 81 73  BILITOT 0.36 0.5 0.5  PROT 7.0 7.1 6.1  ALBUMIN 3.4* 3.7 3.0*    Recent Labs Lab 11/22/12 1800  LIPASE 16   No results found for this basename: AMMONIA,  in the last 168 hours CBC:  Recent Labs Lab 11/19/12 1128 11/22/12 1800 11/23/12 0435 11/23/12 0730 11/24/12 0430 11/25/12 0505  WBC 7.0 8.2 8.4 7.3 10.3 7.5  NEUTROABS 5.8 6.6 6.9  --   --   --   HGB 12.7 13.6 12.4 12.4 11.7* 11.5*  HCT 38.4 41.8 39.1 38.4 36.2 35.9*  MCV 107.1* 108.0* 110.1* 109.7* 108.1* 107.2*  PLT 386 347 316 305 280 269   Cardiac Enzymes:  Recent Labs Lab 11/22/12 2249 11/23/12 0435 11/23/12 1010  TROPONINI <0.30 <0.30 <0.30   BNP (last 3 results) No results found for this basename: PROBNP,  in the last 8760 hours CBG:  Recent Labs Lab 11/23/12 1141 11/23/12 1222 11/23/12 1832 11/24/12 0007 11/24/12 0558  GLUCAP 64* 162* 141* 106* 96    No results found for this or any previous visit (from the past 240 hour(s)).   Studies: No results found.  Scheduled Meds: . feeding supplement  1 Container Oral TID  BM  . metoprolol  2.5 mg Intravenous Q6H  . mometasone-formoterol  2 puff Inhalation BID  . pantoprazole (PROTONIX) IV  40 mg Intravenous QHS  . sodium chloride  3 mL Intravenous Q12H   Continuous Infusions: . sodium chloride 10 mL/hr at 11/24/12 2310  . sodium chloride      Principal Problem:   Nausea & vomiting Active Problems:   Essential thrombocythemia   Essential hypertension   DVT (deep venous thrombosis)   Chest pain   Protein-calorie malnutrition, severe    Time spent: 35 minutes    Sherri Crawford  [facility] Pager [phone]  If 7PM-7AM, please contact night-coverage at [email or web address], password [facility] 11/25/2012, 3:18 PM  LOS: 3 days

## 2012-11-25 NOTE — H&P (View-Only) (Signed)
Cross cover [facility] Subjective: Since I last evaluated the patient, she seems to be tolerating liquids well. Very anxious about having an EGD done to have a dilatation done. She was not done over the weekend as she was on Xaralto PTA and has been on Heparin in the hospital She denies having any abdominal pain, nausea or vomiting.    Objective: Vital signs in last 24 hours: Temp:  [97.5 F (36.4 C)-98.2 F (36.8 C)] 98 F (36.7 C) (09/28 0600) Pulse Rate:  [79-89] 86 (09/28 1201) Resp:  [18] 18 (09/28 0600) BP: (137-178)/(47-74) 178/71 mmHg (09/28 1201) SpO2:  [95 %-96 %] 96 % (09/28 0600) Last BM Date: 11/24/12  Intake/Output from previous day: 09/27 0701 - 09/28 0700 In: 1692.4 [P.O.:180; I.V.:1512.4] Out: 450 [Urine:450] Intake/Output this shift: Total I/O In: 240 [P.O.:240] Out: -   General appearance: alert, cooperative, appears older than stated age, no distress and mildly obese Resp: clear to auscultation bilaterally Cardio: regular rate and rhythm, S1, S2 normal, no murmur, click, rub or gallop GI: soft, non-tender; bowel sounds normal; no masses,  no organomegaly Extremities: extremities normal, atraumatic, no cyanosis or edema  Lab Results:  Recent Labs  11/23/12 0435 11/23/12 0730 11/24/12 0430  WBC 8.4 7.3 10.3  HGB 12.4 12.4 11.7*  HCT 39.1 38.4 36.2  PLT 316 305 280   BMET  Recent Labs  11/22/12 1800 11/23/12 0435  NA 141 143  K 3.5 3.6  CL 101 106  CO2 24 22  GLUCOSE 83 69*  BUN 17 14  CREATININE 0.74 0.75  CALCIUM 9.9 8.5   LFT  Recent Labs  11/23/12 0435  PROT 6.1  ALBUMIN 3.0*  AST 22  ALT 11  ALKPHOS 73  BILITOT 0.5   PT/INR  Recent Labs  11/22/12 1800  LABPROT 14.0  INR 1.10   Studies/Results: Dg Abd Acute W/chest  11/22/2012   CLINICAL DATA:  Epigastric abdominal pain. Nausea and vomiting. Current history of asthma.  EXAM: ACUTE ABDOMEN SERIES (ABDOMEN 2 VIEW & CHEST 1 VIEW)  COMPARISON:  Portable chest x-ray  09/08/2012 and two-view chest x-ray 05/23/2010. No prior abdominal imaging.  FINDINGS: Bowel gas pattern unremarkable without evidence of obstruction or significant ileus. Moderate stool burden throughout the colon. No evidence of free air or significant air-fluid levels on the lateral decubitus image. Extensive aortoiliofemoral cyber poor and visceral artery atherosclerosis without aneurysm. No visible opaque urinary tract calculi. Prior right hip arthroplasty with anatomic alignment. Degenerative changes involving the lumbar spine with slight lumbar scoliosis convex left.  Cardiac silhouette upper normal in size for AP technique, unchanged. Thoracic aorta atherosclerotic, unchanged. Hilar and mediastinal contours otherwise unremarkable. Linear scarring in the lung bases, unchanged. No new pulmonary parenchymal abnormalities.  IMPRESSION: 1. No acute abdominal abnormality. Moderate stool burden. 2. No acute cardiopulmonary disease. Stable scarring in the lung bases.   Electronically Signed   By: Thomas  Lawrence   On: 11/22/2012 17:34   Medications: I have reviewed the patient's current medications.  Assessment/Plan: 1) Dysphagia with a history of esophageal stricture [dialted last about 2 years ago]/GERD/Nausea and vomiting PTA/Abnormal weight loss/history of presbyesophagus in the chart: Will keep NPO tonight for an EGD tomorrow and let Dr. Pyrtle decide on the timing of when the Heparin needs to be held before the EGD. Continue present care. 2) DVT LLL; On Heparin.   LOS: 2 days   Ronte Parker 11/24/2012, 2:51 PM   

## 2012-11-25 NOTE — Progress Notes (Signed)
Clinical Social Work Department BRIEF PSYCHOSOCIAL ASSESSMENT 11/25/2012  Patient:  Sherri Crawford, Sherri Crawford     Account Number:  [id number]     Admit date:  11/22/2012  Clinical Social Worker:  Orpah Greek  Date/Time:  11/25/2012 10:25 AM  Referred by:  Physician  Date Referred:  11/25/2012 Referred for  Other - See comment   Other Referral:   Admitted from: [facility] - SNF   Interview type:  Family Other interview type:   patient's son, Sherri Crawford via phone    PSYCHOSOCIAL DATA Living Status:  FACILITY Admitted from facility:  [facility] Level of care:  Skilled Nursing Facility Primary support name:  Sherri Crawford (son) c#: [phone] h#: [phone] Primary support relationship to patient:  CHILD, ADULT Degree of support available:   good    CURRENT CONCERNS Current Concerns  Post-Acute Placement   Other Concerns:    SOCIAL WORK ASSESSMENT / PLAN CSW received consult that patient was admitted from [facility] SNF.   Assessment/plan status:  Information/Referral to [facility] Other assessment/ plan:   Information/referral to community resources:   CSW completed FL2 and faxed information to [employer] - confirmed with Florentina Addison that patient is ok to return when stable.    PATIENT'S/FAMILY'S RESPONSE TO PLAN OF CARE: Patient has been living at the Skilled Nursing side of [facility] & plans to return there at discharge. Anticipating discharge tomorrow.       Unice Bailey, LCSW [facility] Clinical Social Worker cell #: [phone]

## 2012-11-25 NOTE — Op Note (Signed)
[facility] [address] [place], [address]   ENDOSCOPY PROCEDURE REPORT  PATIENT: Sherri Crawford, Sherri Crawford  MR#: [id number] BIRTHDATE: Mar 09, 1920 , 89  yrs. old GENDER: Female ENDOSCOPIST: Beverley Fiedler, MD REFERRED BY:  [facility] PROCEDURE DATE:  11/25/2012 PROCEDURE:  EGD w/ biopsy; EGD with balloon dilation ASA CLASS:     Class III INDICATIONS:  Dysphagia.  History of esophageal dysmotility, history of previous esophageal dilation MEDICATIONS: These medications were titrated to patient response per physician's verbal order, Fentanyl 50 mcg IV, and Versed 4 mg IV TOPICAL ANESTHETIC: Cetacaine Spray x 1  DESCRIPTION OF PROCEDURE: After the risks benefits and alternatives of the procedure were thoroughly explained, informed consent was obtained.  The Pentax Gastroscope [id number] endoscope was introduced through the mouth and advanced to the second portion of the duodenum. Without limitations.  The instrument was slowly withdrawn as the mucosa was fully examined.     ESOPHAGUS: Discrete and small, adherent whitish plaques were found scattered in the upper third of the esophagus and middle third of the esophagus.  Multiple biopsies were performed using cold forceps to exclude Candida.  There was a single diverticulum, small mouthed, noted in the mid esophagus, likely as a result of esophageal dysmotility. Mild resistance was encountered in the distal esophagus at the GE junction without definitive stricture. Due to history of dysphagia, balloon dilation was performed with TTS balloon to 13.5 mm and 15 mm. Post dilation appearance did not suggest mucosal tear.  STOMACH: Multiple sessile polyps, previously seen, ranging between 3-45mm in size were found in the gastric fundus and gastric body. Most likely be fundic gland in nature. The stomach otherwise appeared normal.  DUODENUM: The duodenal mucosa showed no abnormalities in the bulb and second portion of  the duodenum. Retroflexed views revealed no abnormalities.     The scope was then withdrawn from the patient and the procedure completed.  COMPLICATIONS: There were no complications.  ENDOSCOPIC IMPRESSION: 1.  Small white plaques upper third of the esophagus and middle third of the esophagus; multiple biopsies to exclude Candida 2.   Empiric dilation in the lower third of the esophagus and across GE junction with TTS balloon to 15 mm 3.   Multiple sessile, likely fundic gland, polyps ranging between 3-35mm in size were found in the gastric fundus and gastric body 3.   The stomach otherwise appeared normal 4.   The duodenal mucosa showed no abnormalities in the bulb and second portion of the duodenum  RECOMMENDATIONS: 1.  Full liquid diet the remainder of today, advance as tolerated 2.  Consider speech and swallow evaluation to guide best tolerated diet 3.  Await pathology results   eSigned:  Beverley Fiedler, MD 11/25/2012 4:15 PM   CC:The Patient and Lina Sar, MD  PATIENT NAME:  [name], [name] MR#: [id number]

## 2012-11-26 ENCOUNTER — Encounter (HOSPITAL_COMMUNITY): Payer: Self-pay | Admitting: Internal Medicine

## 2012-11-26 DIAGNOSIS — K228 Other specified diseases of esophagus: Secondary | ICD-10-CM

## 2012-11-26 DIAGNOSIS — K2289 Other specified disease of esophagus: Secondary | ICD-10-CM

## 2012-11-26 LAB — CBC
HCT: 35 % — ABNORMAL LOW (ref 36.0–46.0)
Hemoglobin: 11.2 g/dL — ABNORMAL LOW (ref 12.0–15.0)
MCH: 34.1 pg — ABNORMAL HIGH (ref 26.0–34.0)
MCHC: 32 g/dL (ref 30.0–36.0)
MCV: 106.7 fL — ABNORMAL HIGH (ref 78.0–100.0)
RDW: 14.4 % (ref 11.5–15.5)

## 2012-11-26 MED ORDER — METOPROLOL TARTRATE 25 MG PO TABS
25.0000 mg | ORAL_TABLET | Freq: Two times a day (BID) | ORAL | Status: DC
  Administered 2012-11-26: 25 mg via ORAL
  Filled 2012-11-26 (×2): qty 1

## 2012-11-26 NOTE — Progress Notes (Signed)
I agree with the above documentation, including the assessment and plan. Awaiting bx, will contact pt at SNF if needs treatment for candida.  Hopefully dilation yesterday will prove beneficial, but certainly some component of trouble swallowing is motility related.  Esophageal manometry would be the next test if symptoms do not respond.

## 2012-11-26 NOTE — Discharge Summary (Signed)
Physician Discharge Summary  Sherri Crawford ZOX:[id number] DOB: April 12, 1920 DOA: 11/22/2012  PCP: Kimber Relic, MD  Admit date: 11/22/2012 Discharge date: 11/26/2012  Time spent: 45  minutes  Recommendations for Outpatient Follow-up:  -Will be discharged back to SNF today in stable condition. -Will need to follow up on pathology report from biopsies taken during EGD. May need to be treated for candida if positive.   Discharge Diagnoses:  Principal Problem:   Nausea & vomiting Active Problems:   Essential thrombocythemia   Essential hypertension   DVT (deep venous thrombosis)   Chest pain   Protein-calorie malnutrition, severe   Discharge Condition: Stable and improved  Filed Weights   11/22/12 2223 11/25/12 1453  Weight: 47.4 kg (104 lb 8 oz) 46.465 kg (102 lb 7 oz)    History of present illness:  Sherri Crawford is a 79 y.o. female with history of myelodysplastic syndrome (essential thrombocythemia), history of DVT on xarelto, hypertension, COPD presented to the ER because of persistent vomiting and unable to eat over the last 2 days. Patient states that generally she tries to eat she has no difficulty in swallowing and has an appetite but after eating she immediately throws up. This has been happening last 2 days. Denies any abdominal pain diarrhea fever chills. Over the last few days patient also noticed some chest pressure. The pressure lasts a few minutes and his present even at rest. EKG shows some T-wave inversion in V3 V4 and inferior leads. Troponin is pending. Acute abdominal series only showed some stool burden otherwise unremarkable. Patient has been admitetd for further management. Patient denies any shortness of breath presently chest pain-free.   Hospital Course:   Regurgitation of Food  -With h/o esophageal stricture.  -S/p dilatation. -Had some whiteish plaques during EGD; biopsies were taken for possible candida, will need to follow pathology report and treat  if appropriate. -Appreciate GI assistance.  Chest Pain  -Has been CP free since admission.  -All troponins have been negative.   H/o DVT  -Xarelto has been restarted after being on a heparin drip for her EGD.   Procedures: EGD: ENDOSCOPIC IMPRESSION:  1. Small white plaques upper third of the esophagus and middle  third of the esophagus; multiple biopsies to exclude Candida  2. Empiric dilation in the lower third of the esophagus and across  GE junction with TTS balloon to 15 mm  3. Multiple sessile, likely fundic gland, polyps ranging between  3-39mm in size were found in the gastric fundus and gastric body  3. The stomach otherwise appeared normal  4. The duodenal mucosa showed no abnormalities in the bulb and  second portion of the duodenum   Consultations:  GI  Discharge Instructions  Discharge Orders   Future Appointments Provider Department Dept Phone   02/06/2013 1:30 PM Kimber Relic, MD [facility] [phone]   05/20/2013 1:15 PM Dava Najjar Idelle Jo [facility] MEDICAL ONCOLOGY [phone]   05/20/2013 1:45 PM Rana Snare, NP [facility] MEDICAL [facility]COL[facility] [phone]   Future Orders Complete By Expires   Diet - low sodium heart healthy  As directed    Discontinue IV  As directed    Increase activity slowly  As directed        Medication List    STOP taking these medications       aspirin EC 81 MG tablet      TAKE these medications       albuterol  108 (90 BASE) MCG/ACT inhaler  Commonly known as:  PROAIR HFA  Inhale 2 puffs into the lungs every 6 (six) hours as needed for wheezing. As needed for wheezing     atorvastatin 20 MG tablet  Commonly known as:  LIPITOR  Take 20 mg by mouth every evening.     CERTAVITE/ANTIOXIDANTS PO  Take 1 capsule by mouth every morning.     cholecalciferol 1000 UNITS tablet  Commonly known as:  VITAMIN D  Take 1,000 Units by mouth every morning.     EPIPEN 2-PAK 0.3 mg/0.3 mL  Soaj injection  Generic drug:  EPINEPHrine  Inject 0.3 mg into the skin once as needed (allergic reaction).     feeding supplement Liqd  Take 1 Container by mouth 4 (four) times daily.     ferrous sulfate 325 (65 FE) MG tablet  Take 325 mg by mouth 2 (two) times daily.     Fluticasone-Salmeterol 250-50 MCG/DOSE Aepb  Commonly known as:  ADVAIR  Inhale 1 puff into the lungs every 12 (twelve) hours.     HYDROcodone-acetaminophen 10-325 MG per tablet  Commonly known as:  NORCO  Take 1 tablet by mouth 4 (four) times daily -  before meals and at bedtime.     hydroxyurea 500 MG capsule  Commonly known as:  HYDREA  Take 500 mg by mouth See admin instructions. Take 2 capsules on Monday. Take 1 capsule every other day of the week (Wed, Fri, Sun)     memantine 5 MG tablet  Commonly known as:  NAMENDA  Take 1 tablet (5 mg total) by mouth 2 (two) times daily.     metoprolol tartrate 25 MG tablet  Commonly known as:  LOPRESSOR  Take 25 mg by mouth 2 (two) times daily.     montelukast 10 MG tablet  Commonly known as:  SINGULAIR  Take 10 mg by mouth at bedtime.     nitroGLYCERIN 0.4 MG SL tablet  Commonly known as:  NITROSTAT  Place 0.4 mg under the tongue every 5 (five) minutes as needed for chest pain.     omeprazole 20 MG capsule  Commonly known as:  PRILOSEC  Take 20 mg by mouth every morning.     Rivaroxaban 15 MG Tabs tablet  Commonly known as:  XARELTO  Take 15 mg by mouth daily.     sertraline 25 MG tablet  Commonly known as:  ZOLOFT  Take 25 mg by mouth every evening.       Allergies  Allergen Reactions  . Sulfonamide Derivatives Other (See Comments)    Pt is unsure of reaction      The results of significant diagnostics from this hospitalization (including imaging, microbiology, ancillary and laboratory) are listed below for reference.    Significant Diagnostic Studies: Dg Abd Acute W/chest  11/22/2012   CLINICAL DATA:  Epigastric abdominal pain. Nausea and  vomiting. Current history of asthma.  EXAM: ACUTE ABDOMEN SERIES (ABDOMEN 2 VIEW & CHEST 1 VIEW)  COMPARISON:  Portable chest x-ray 09/08/2012 and two-view chest x-ray 05/23/2010. No prior abdominal imaging.  FINDINGS: Bowel gas pattern unremarkable without evidence of obstruction or significant ileus. Moderate stool burden throughout the colon. No evidence of free air or significant air-fluid levels on the lateral decubitus image. Extensive aortoiliofemoral cyber poor and visceral artery atherosclerosis without aneurysm. No visible opaque urinary tract calculi. Prior right hip arthroplasty with anatomic alignment. Degenerative changes involving the lumbar spine with slight lumbar scoliosis convex left.  Cardiac  silhouette upper normal in size for AP technique, unchanged. Thoracic aorta atherosclerotic, unchanged. Hilar and mediastinal contours otherwise unremarkable. Linear scarring in the lung bases, unchanged. No new pulmonary parenchymal abnormalities.  IMPRESSION: 1. No acute abdominal abnormality. Moderate stool burden. 2. No acute cardiopulmonary disease. Stable scarring in the lung bases.   Electronically Signed   By: Hulan Saas   On: 11/22/2012 17:34    Microbiology: No results found for this or any previous visit (from the past 240 hour(s)).   Labs: Basic Metabolic Panel:  Recent Labs Lab 11/19/12 1128 11/22/12 1800 11/23/12 0435  NA 142 141 143  K 4.7 3.5 3.6  CL  --  101 106  CO2 26 24 22   GLUCOSE 127 83 69*  BUN 17.6 17 14   CREATININE 0.9 0.74 0.75  CALCIUM 10.1 9.9 8.5   Liver Function Tests:  Recent Labs Lab 11/19/12 1128 11/22/12 1800 11/23/12 0435  AST 21 23 22   ALT 14 12 11   ALKPHOS 77 81 73  BILITOT 0.36 0.5 0.5  PROT 7.0 7.1 6.1  ALBUMIN 3.4* 3.7 3.0*    Recent Labs Lab 11/22/12 1800  LIPASE 16   No results found for this basename: AMMONIA,  in the last 168 hours CBC:  Recent Labs Lab 11/19/12 1128  11/22/12 1800 11/23/12 0435 11/23/12 0730  11/24/12 0430 11/25/12 0505 11/26/12 0526  WBC 7.0  < > 8.2 8.4 7.3 10.3 7.5 7.1  NEUTROABS 5.8  --  6.6 6.9  --   --   --   --   HGB 12.7  < > 13.6 12.4 12.4 11.7* 11.5* 11.2*  HCT 38.4  < > 41.8 39.1 38.4 36.2 35.9* 35.0*  MCV 107.1*  < > 108.0* 110.1* 109.7* 108.1* 107.2* 106.7*  PLT 386  < > 347 316 305 280 269 291  < > = values in this interval not displayed. Cardiac Enzymes:  Recent Labs Lab 11/22/12 2249 11/23/12 0435 11/23/12 1010  TROPONINI <0.30 <0.30 <0.30   BNP: BNP (last 3 results) No results found for this basename: PROBNP,  in the last 8760 hours CBG:  Recent Labs Lab 11/23/12 1141 11/23/12 1222 11/23/12 1832 11/24/12 0007 11/24/12 0558  GLUCAP 64* 162* 141* 106* 96       Signed:  [name],[name]  [facility] Pager: [phone] 11/26/2012, 9:35 AM

## 2012-11-26 NOTE — Evaluation (Signed)
Clinical/Bedside Swallow Evaluation Patient Details  Name: Sherri Crawford MRN: [id number] Date of Birth: [birth year]  Today's Date: 11/26/2012 Time: 4098-1191 SLP Time Calculation (min): 43 min  Past Medical History:  Past Medical History  Diagnosis Date  . Macular degeneration   . COPD (chronic obstructive pulmonary disease)   . Hypertension   . Coronary artery disease   . Hypercholesterolemia   . GERD (gastroesophageal reflux disease)   . Asthmatic bronchitis   . History of colonic polyps   . DJD (degenerative joint disease)   . Lumbar back pain   . Osteoporosis   . History of transient ischemic attack (TIA)   . Anxiety   . Essential thrombocythemia   . Diverticulosis 05/10/2012  . Osteoarthrosis, unspecified whether generalized or localized, unspecified site 05/09/2012  . Memory loss 05/09/2012  . Abnormal weight gain 05/09/2012  . Phlebitis and thrombophlebitis of other deep vessels of lower extremities 07/10/2010  . Essential thrombocythemia 06/09/2009   Past Surgical History:  Past Surgical History  Procedure Laterality Date  . Appendectomy    . Cholecystectomy    . Tah and bso  08/21/1995  . Cataract extraction w/ intraocular lens  implant, bilateral    . Abdominal hysterectomy    . Hip arthroplasty Right 09/09/2012    Procedure: ARTHROPLASTY BIPOLAR HIP;  Surgeon: Shelda Pal, MD;  Location: [facility]S;  Service: Orthopedics;  Laterality: Right;  . Esophagogastroduodenoscopy (egd) with esophageal dilation N/A 11/25/2012    Procedure: ESOPHAGOGASTRODUODENOSCOPY (EGD) WITH ESOPHAGEAL DILATION;  Surgeon: Beverley Fiedler, MD;  Location: [facility] END[facility]COPY;  Service: Gastroenterology;  Laterality: N/A;   HPI:  79 yo female adm to [facility] with N/V, Pt PMH + for esophageal dysmotility and previous esophageal dilatations.  Previous barium swallow study in 05/2010 showed diffuse esophageal dysmotility disorder with narrowing of distal esophagus, ? stricture or spasm, no hiatal hernia, flash  penetration (asymptomatic).  Numerous tertiary contractions in distal esophagus with only intermittent relaxation at GE.  Pt had undergone EGD 05/2010 that showed presbyesophagus, no definitive stricture or spasm, rec soft diet chewing wel and consider medicine for spasm if needed per Dr Regino Schultze note.  EGD during this admit showed whitish plaques in upper third and middle third of esophagus, mild resistance at GE without definite stricture s/p dilatation, multple polyps.  GI referred to SLP to aid in educating pt to diet recommendations to mitigate dysphagia.  Pt initially denies problems "swallowing" but admitted to feeling like foods don't always clear into esophagus.  Pt also states she does not have much of an appetitie    Assessment / Plan / Recommendation Clinical Impression  Pt seen to provide possible diet modifications to maximize swallow efficiency and comfort with po and compensation strategies to mitigate dysphagia.  Pt has been compensating for her chronic esophageal dysphagia - dating back several years.  She admits that liquids are easier to clear than solids and she will take rest breaks when she senses fullness.  Xerostomia admitted by pt for which SLP provided compensation/sample packet Biotene.  Observed pt consuming pill with water without sensation of stasis.  Advised pt to start meal with liquid (try room temp or warm with h/o possible spasm), consume liquids t/o meal to aid clearance, conduct dry swallows and/or drink liquids if sense stasis, eat several small meals each day, stay upright for one hour after eating and attend to swallow to monitor symptoms of difficulties.    Provided pt with written options for diet modifiers- (soft foods, liquids,  smoothies, etc), compensation strategies,etc.    SLP used teach back to assure comprehension. SLP to sign off, thanks for this referral.         Aspiration Risk    chronic from esophageal issues   Diet Recommendation Dysphagia 3  (Mechanical Soft);Thin liquid (extra sauce/gravies)   Liquid Administration via: Cup;Straw Medication Administration: Whole meds with liquid Supervision: Patient able to self feed Compensations: Slow rate;Follow solids with liquid (start meal with liquids, drink room temp or warm liquids ) Postural Changes and/or Swallow Maneuvers: Seated upright 90 degrees;Upright 30-60 min after meal (graze throughout day)    Other  Recommendations Oral Care Recommendations: Oral care QID   Follow Up Recommendations  None    Frequency and Duration   n/a     Pertinent Vitals/Pain Afebrile, decreased    SLP Swallow Goals     Swallow Study Prior Functional Status   see clinical impressions    General Date of Onset: 11/26/12 HPI: 79 yo female adm to [facility] with N/V, Pt PMH + for esophageal dysmotility and previous esophageal dilatations.  Previous barium swallow study in 05/2010 showed diffuse esophageal dysmotility disorder with narrowing of distal esophagus, ? stricture or spasm, no hiatal hernia, flash penetration (asymptomatic).  Numerous tertiary contractions in distal esophagus with only intermittent relaxation at GE.  Pt had undergone EGD 05/2010 that showed presbyesophagus, no definitive stricture or spasm, rec soft diet chewing wel and consider medicine for spasm if needed per Dr Regino Schultze note.  EGD during this admit showed whitish plaques in upper third and middle third of esophagus, mild resistance at GE without definite stricture s/p dilatation, multple polyps.  GI referred to SLP to aid in educating pt to diet recommendations to mitigate dysphagia.  Pt initially denies problems "swallowing" but admitted to feeling like foods don't always clear into esophagus.  Pt also states she does not have much of an appetitie  Type of Study: Bedside swallow evaluation Diet Prior to this Study: Regular;Thin liquids Temperature Spikes Noted: No Respiratory Status: Room air History of Recent Intubation:  No Behavior/Cognition: Alert;Cooperative Oral Cavity - Dentition: Adequate natural dentition Self-Feeding Abilities: Able to feed self Patient Positioning: Upright in bed Baseline Vocal Quality: Clear Volitional Cough:  (DNT) Volitional Swallow: Able to elicit    Oral/Motor/Sensory Function Overall Oral Motor/Sensory Function: Appears within functional limits for tasks assessed   Ice Chips Ice chips: Not tested   Thin Liquid Thin Liquid: Within functional limits    Nectar Thick Nectar Thick Liquid: Not tested   Honey Thick Honey Thick Liquid: Not tested   Puree Puree: Not tested   Solid   GO    Solid: Not tested      [name], MS [facility] SLP [phone]

## 2012-11-26 NOTE — Progress Notes (Signed)
[facility] Progress Note  Subjective:  Tolerated full liquid diet without anything coming back up.  No abdominal or chest pain.  Would like to go home today.  Objective:  Vital signs in last 24 hours: Temp:  [97 F (36.1 C)-99 F (37.2 C)] 97.4 F (36.3 C) (09/30 0528) Pulse Rate:  [83-100] 83 (09/30 0528) Resp:  [15-39] 20 (09/30 0528) BP: (134-187)/(43-103) 155/54 mmHg (09/30 0528) SpO2:  [80 %-100 %] 97 % (09/30 0750) Weight:  [102 lb 7 oz (46.465 kg)] 102 lb 7 oz (46.465 kg) (09/29 1453) Last BM Date: 11/24/12 General:   Alert, Well-developed, in NAD Heart:  Regular rate and rhythm Pulm:  CTAB.  No W/R/R. Abdomen:  Soft, nontender and nondistended. Normal bowel sounds, without guarding, and without rebound.   Extremities:  Without edema. Neurologic:  Alert and  oriented x4;  grossly normal neurologically. Psych:  Alert and cooperative. Normal mood and affect.  Intake/Output from previous day: 09/29 0701 - 09/30 0700 In: 974.5 [P.O.:840; I.V.:134.5] Out: 550 [Urine:550]  Lab Results:  Recent Labs  11/24/12 0430 11/25/12 0505 11/26/12 0526  WBC 10.3 7.5 7.1  HGB 11.7* 11.5* 11.2*  HCT 36.2 35.9* 35.0*  PLT 280 269 291   Assessment / Plan: -Dysphagia:  Very likely secondary to presbyesophagus, but underwent EGD 9/29 with empiric balloon dilation of lower esophagus and across GE junction.  Also had white plaques seen in esophagus s/p biopsies to rule out Candida.  Eval by SLP today for diet recommendations.  Ok for D/C from GI standpoint otherwise. -DVT:  Requiring anti-coagulation.   LOS: 4 days   Eldon Zietlow D.  11/26/2012, 9:02 AM  Pager number [phone]

## 2012-11-27 ENCOUNTER — Non-Acute Institutional Stay (SKILLED_NURSING_FACILITY): Payer: Medicare Other | Admitting: Nurse Practitioner

## 2012-11-27 ENCOUNTER — Encounter: Payer: Self-pay | Admitting: Nurse Practitioner

## 2012-11-27 DIAGNOSIS — M545 Low back pain, unspecified: Secondary | ICD-10-CM

## 2012-11-27 DIAGNOSIS — D473 Essential (hemorrhagic) thrombocythemia: Secondary | ICD-10-CM

## 2012-11-27 DIAGNOSIS — F32A Depression, unspecified: Secondary | ICD-10-CM

## 2012-11-27 DIAGNOSIS — F0390 Unspecified dementia without behavioral disturbance: Secondary | ICD-10-CM

## 2012-11-27 DIAGNOSIS — F329 Major depressive disorder, single episode, unspecified: Secondary | ICD-10-CM

## 2012-11-27 DIAGNOSIS — D62 Acute posthemorrhagic anemia: Secondary | ICD-10-CM

## 2012-11-27 DIAGNOSIS — J449 Chronic obstructive pulmonary disease, unspecified: Secondary | ICD-10-CM

## 2012-11-27 DIAGNOSIS — K2289 Other specified disease of esophagus: Secondary | ICD-10-CM

## 2012-11-27 DIAGNOSIS — I82402 Acute embolism and thrombosis of unspecified deep veins of left lower extremity: Secondary | ICD-10-CM

## 2012-11-27 DIAGNOSIS — K228 Other specified diseases of esophagus: Secondary | ICD-10-CM

## 2012-11-27 DIAGNOSIS — I82409 Acute embolism and thrombosis of unspecified deep veins of unspecified lower extremity: Secondary | ICD-10-CM

## 2012-11-27 DIAGNOSIS — I10 Essential (primary) hypertension: Secondary | ICD-10-CM

## 2012-11-27 DIAGNOSIS — F341 Dysthymic disorder: Secondary | ICD-10-CM

## 2012-11-27 NOTE — Assessment & Plan Note (Signed)
Difficulty of swallowing-Endoscopic exam 11/25/12 showed esophageal dysmotility and empiric dilation in the lower third of the esophagus and across GE junction with TTS balloon to 15mm. Will follow the recommendation-diet as tolerated.

## 2012-11-27 NOTE — Assessment & Plan Note (Signed)
Treated by Hematology, takes Hydrea 1000mg  Monday, 500mg  Wed, Fri, Sun. Plt 362 10/18/12

## 2012-11-27 NOTE — Progress Notes (Signed)
Patient is set to discharge back to [facility] today. Patient & son, Gene aware. Discharge packet in [place]. Son to transport back when ready.   Unice Bailey, LCSW [facility] Clinical Social Worker cell #: [phone]

## 2012-11-27 NOTE — Assessment & Plan Note (Signed)
Controlled on Metoprolol 25mg bid.    

## 2012-11-27 NOTE — Assessment & Plan Note (Signed)
Switched to [place] since 09/12/12. Left   

## 2012-11-27 NOTE — Progress Notes (Signed)
Patient ID: Sherri Crawford, female   DOB: 02-02-1921, 79 y.o.   MRN: [id number] Code Status: Full Code.   Allergies  Allergen Reactions  . Sulfonamide Derivatives Other (See Comments)    Pt is unsure of reaction    Chief Complaint  Patient presents with  . Medical Managment of Chronic Issues    dysphagia.     HPI: Patient is a 79 y.o. female seen in the SNF at [facility] today for evaluation of scaly itching BLE and her other chronic medical conditions.  Problem List Items Addressed This Visit   Acute blood loss anemia     Off Iron-Hgb 11s            Anxiety and depression     Stable on Sertraline 25mg  daily.           BACK PAIN, LUMBAR     Hx of lower back pain, had X-ray of her lumbar spine 09/08/12--spondylosis-better controlled with Hydrocodone/ApAp  10/325mg  ac and hs and Flexeril 5mg  was changed to prn             COPD with asthma     Managed with Fluticasone-Salmeterol 250/50 bid, Epipen prn, Montelukast 10mg  daily, and Albuterol II puffs q6hr prn.             Deep vein thrombosis of left lower extremity     Switched to Xeralto since 09/12/12. Left             Dementia     Presently Namenda daily and SNF for care needs.             Essential hypertension     Controlled on Metoprolol 25mg  bid.             Essential thrombocythemia     Treated by Hematology, takes Hydrea 1000mg  Monday, 500mg  Wed, Fri, Sun. Plt 362 10/18/12        Presbyesophagus - Primary     Difficulty of swallowing-Endoscopic exam 11/25/12 showed esophageal dysmotility and empiric dilation in the lower third of the esophagus and across GE junction with TTS balloon to 15mm. Will follow the recommendation-diet as tolerated.        Review of Systems:  Review of Systems  Constitutional: Negative for fever, chills, weight loss, malaise/fatigue and diaphoresis.  HENT: Positive for hearing loss. Negative for ear pain, nosebleeds,  congestion, sore throat, neck pain, tinnitus and ear discharge.   Eyes: Negative for blurred vision, double vision, photophobia, pain, discharge and redness.  Respiratory: Positive for cough and wheezing (occasional. ). Negative for hemoptysis, sputum production, shortness of breath and stridor.   Cardiovascular: Negative for chest pain, palpitations, orthopnea, claudication and PND. Leg swelling: trace RLE-resolved.   Gastrointestinal: Positive for blood in stool. Negative for heartburn, nausea, vomiting, abdominal pain, diarrhea, constipation and melena.       Dysphagia.   Genitourinary: Positive for frequency. Negative for dysuria, urgency, hematuria and flank pain.  Musculoskeletal: Positive for joint pain (right hip). Negative for myalgias and falls (recent and resulted in the right hip fx. ). Back pain: chronic back pain is managed with Norco.  Skin: Negative for itching and rash.       Itching scaly mid of R+L shins.   Neurological: Negative for dizziness, tingling, tremors, sensory change, speech change, focal weakness, seizures, loss of consciousness, weakness and headaches.  Endo/Heme/Allergies: Positive for environmental allergies. Negative for polydipsia. Bruises/bleeds easily.  Psychiatric/Behavioral: Positive for depression and memory loss. Negative for suicidal ideas,  hallucinations and substance abuse. The patient is nervous/anxious. The patient does not have insomnia.      Past Medical History  Diagnosis Date  . Macular degeneration   . COPD (chronic obstructive pulmonary disease)   . Hypertension   . Coronary artery disease   . Hypercholesterolemia   . GERD (gastroesophageal reflux disease)   . Asthmatic bronchitis   . History of colonic polyps   . DJD (degenerative joint disease)   . Lumbar back pain   . Osteoporosis   . History of transient ischemic attack (TIA)   . Anxiety   . Essential thrombocythemia   . Diverticulosis 05/10/2012  . Osteoarthrosis, unspecified  whether generalized or localized, unspecified site 05/09/2012  . Memory loss 05/09/2012  . Abnormal weight gain 05/09/2012  . Phlebitis and thrombophlebitis of other deep vessels of lower extremities 07/10/2010  . Essential thrombocythemia 06/09/2009   Past Surgical History  Procedure Laterality Date  . Appendectomy    . Cholecystectomy    . Tah and bso  08/21/1995  . Cataract extraction w/ intraocular lens  implant, bilateral    . Abdominal hysterectomy    . Hip arthroplasty Right 09/09/2012    Procedure: ARTHROPLASTY BIPOLAR HIP;  Surgeon: Shelda Pal, MD;  Location: [facility];  Service: Orthopedics;  Laterality: Right;  . Esophagogastroduodenoscopy (egd) with esophageal dilation N/A 11/25/2012    Procedure: ESOPHAGOGASTRODUODENOSCOPY (EGD) WITH ESOPHAGEAL DILATION;  Surgeon: Beverley Fiedler, MD;  Location: [facility];  Service: Gastroenterology;  Laterality: N/A;   Social History:   reports that she has never smoked. She has never used smokeless tobacco. She reports that she does not drink alcohol or use illicit drugs.  Family History  Problem Relation Age of Onset  . Colon cancer Son 62  . Crohn's disease Son   . Cancer Maternal Aunt     Medications: Patient's Medications  New Prescriptions   No medications on file  Previous Medications   ALBUTEROL (PROAIR HFA) 108 (90 BASE) MCG/ACT INHALER    Inhale 2 puffs into the lungs every 6 (six) hours as needed for wheezing. As needed for wheezing   ATORVASTATIN (LIPITOR) 20 MG TABLET    Take 20 mg by mouth every evening.    CHOLECALCIFEROL (VITAMIN D) 1000 UNITS TABLET    Take 1,000 Units by mouth every morning.   EPIPEN 2-PAK 0.3 MG/0.3ML DEVI    Inject 0.3 mg into the skin once as needed (allergic reaction).    FEEDING SUPPLEMENT (RESOURCE BREEZE) LIQD    Take 1 Container by mouth 4 (four) times daily.   FERROUS SULFATE 325 (65 FE) MG TABLET    Take 325 mg by mouth 2 (two) times daily.   FLUTICASONE-SALMETEROL (ADVAIR) 250-50 MCG/DOSE  AEPB    Inhale 1 puff into the lungs every 12 (twelve) hours.   HYDROCODONE-ACETAMINOPHEN (NORCO) 10-325 MG PER TABLET    Take 1 tablet by mouth 4 (four) times daily -  before meals and at bedtime.    HYDROXYUREA (HYDREA) 500 MG CAPSULE    Take 500 mg by mouth See admin instructions. Take 2 capsules on Monday. Take 1 capsule every other day of the week (Wed, Fri, Sun)   MEMANTINE (NAMENDA) 5 MG TABLET    Take 1 tablet (5 mg total) by mouth 2 (two) times daily.   METOPROLOL TARTRATE (LOPRESSOR) 25 MG TABLET    Take 25 mg by mouth 2 (two) times daily.   MONTELUKAST (SINGULAIR) 10 MG TABLET    Take 10  mg by mouth at bedtime.   MULTIPLE VITAMINS-MINERALS (CERTAVITE/ANTIOXIDANTS PO)    Take 1 capsule by mouth every morning.   NITROGLYCERIN (NITROSTAT) 0.4 MG SL TABLET    Place 0.4 mg under the tongue every 5 (five) minutes as needed for chest pain.    OMEPRAZOLE (PRILOSEC) 20 MG CAPSULE    Take 20 mg by mouth every morning.    RIVAROXABAN (XARELTO) 15 MG TABS TABLET    Take 15 mg by mouth daily.   SERTRALINE (ZOLOFT) 25 MG TABLET    Take 25 mg by mouth every evening.  Modified Medications   No medications on file  Discontinued Medications   No medications on file     Physical Exam: Physical Exam  Constitutional: She is oriented to person, place, and time. She appears well-developed and well-nourished. No distress.  HENT:  Head: Normocephalic and atraumatic.  Eyes: Conjunctivae and EOM are normal. Pupils are equal, round, and reactive to light.  Neck: Normal range of motion. Neck supple. No JVD present. No thyromegaly present.  Cardiovascular: Normal rate, regular rhythm and normal heart sounds.   Pulmonary/Chest: Effort normal. She has decreased breath sounds. She has no wheezes. She has no rales.  Abdominal: Soft. Bowel sounds are normal. There is no tenderness.  Musculoskeletal: Normal range of motion. She exhibits no edema. Tenderness: chronic lower back and the right hip since the fx.  better controloled with scheduled Norco.   Lymphadenopathy:    She has no cervical adenopathy.  Neurological: She is alert and oriented to person, place, and time. She has normal reflexes. No cranial nerve deficit. She exhibits normal muscle tone. Coordination normal.  Skin: Skin is warm and dry. No rash noted. She is not diaphoretic. No erythema (LLE).  The right hip surgical incision healed. Itching/scaly mid shins  Psychiatric: Her mood appears not anxious. Her affect is not angry, not blunt, not labile and not inappropriate. Her speech is not delayed, not tangential and not slurred. She is slowed. She is not agitated, not aggressive, not withdrawn and not combative. Thought content is not paranoid and not delusional. Cognition and memory are impaired. She does not express impulsivity or inappropriate judgment. She exhibits a depressed mood. She exhibits abnormal recent memory.    Filed Vitals:   11/27/12 1356  BP: 136/85  Pulse: 76  Temp: 97.8 F (36.6 C)  TempSrc: Tympanic  Resp: 18      Labs reviewed: Basic Metabolic Panel:  Recent Labs  16/10/96 1417  09/11/12 0455  10/18/12 11/19/12 1128 11/22/12 1800 11/22/12 2249 11/23/12 0435  NA 140  < > 136  < >  --  142 141  --  143  K 4.4  < > 4.2  < >  --  4.7 3.5  --  3.6  CL 106  < > 105  --   --   --  101  --  106  CO2 26  < > 26  --   --  26 24  --  22  GLUCOSE 128*  < > 132*  --   --  127 83  --  69*  BUN 23.0  < > 12  < >  --  17.6 17  --  14  CREATININE 1.1  < > 0.74  < >  --  0.9 0.74  --  0.75  CALCIUM 9.0  < > 8.4  --   --  10.1 9.9  --  8.5  TSH  --   --   --   --  2.81  --   --  0.129*  --   < > = values in this interval not displayed. Liver Function Tests:  Recent Labs  11/19/12 1128 11/22/12 1800 11/23/12 0435  AST 21 23 22   ALT 14 12 11   ALKPHOS 77 81 73  BILITOT 0.36 0.5 0.5  PROT 7.0 7.1 6.1  ALBUMIN 3.4* 3.7 3.0*   CBC:  Recent Labs  11/19/12 1128 11/22/12 1800 11/23/12 0435   11/24/12 0430 11/25/12 0505 11/26/12 0526  WBC 7.0 8.2 8.4  < > 10.3 7.5 7.1  NEUTROABS 5.8 6.6 6.9  --   --   --   --   HGB 12.7 13.6 12.4  < > 11.7* 11.5* 11.2*  HCT 38.4 41.8 39.1  < > 36.2 35.9* 35.0*  MCV 107.1* 108.0* 110.1*  < > 108.1* 107.2* 106.7*  PLT 386 347 316  < > 280 269 291  < > = values in this interval not displayed.  Past Procedures:  09/09/12  CT head:  IMPRESSION: No skull fracture or intracranial hemorrhage.  Small vessel disease type changes without CT evidence of large acute infarct.  X-ray right hip  IMPRESSION: Right femoral prosthesis without acute complication  X-ray Lumbar spine  IMPRESSION: Diffuse lumbar spondylosis.  No evidence of fracture.   X--ray R hip  IMPRESSION: Displaced right subcapital hip fracture.  11/25/12 GED: Esophageal dysmotility w/o definitive stricture. S/p empiric dilation in the lower third of the esophagus and across GE junction with TTS balloon to 15mm. Recommended diet as tolerated.    Assessment/Plan Presbyesophagus Difficulty of swallowing-Endoscopic exam 11/25/12 showed esophageal dysmotility and empiric dilation in the lower third of the esophagus and across GE junction with TTS balloon to 15mm. Will follow the recommendation-diet as tolerated.   Acute blood loss anemia Off Iron-Hgb 11s          Anxiety and depression Stable on Sertraline 25mg  daily.         BACK PAIN, LUMBAR Hx of lower back pain, had X-ray of her lumbar spine 09/08/12--spondylosis-better controlled with Hydrocodone/ApAp  10/325mg  ac and hs and Flexeril 5mg  was changed to prn           COPD with asthma Managed with Fluticasone-Salmeterol 250/50 bid, Epipen prn, Montelukast 10mg  daily, and Albuterol II puffs q6hr prn.           Deep vein thrombosis of left lower extremity Switched to Xeralto since 09/12/12. Left           Dementia Presently Namenda daily and SNF for care needs.            Essential hypertension Controlled on Metoprolol 25mg  bid.           Essential thrombocythemia Treated by Hematology, takes Hydrea 1000mg  Monday, 500mg  Wed, Fri, Sun. Plt 362 10/18/12        Family/ Staff Communication: observe the patient  Goals of Care: dc to AL  Labs/tests ordered: CBC

## 2012-11-27 NOTE — Assessment & Plan Note (Signed)
Presently Namenda daily and SNF for care needs.                  

## 2012-11-27 NOTE — Assessment & Plan Note (Signed)
Stable on Sertraline 25mg daily.    

## 2012-11-27 NOTE — Assessment & Plan Note (Signed)
Off Iron-Hgb 11s                 

## 2012-11-27 NOTE — Assessment & Plan Note (Signed)
Hx of lower back pain, had X-ray of her lumbar spine 09/08/12--spondylosis-better controlled with Hydrocodone/ApAp  10/325mg  ac and hs and Flexeril 5mg  was changed to prn

## 2012-11-27 NOTE — Assessment & Plan Note (Signed)
Managed with Fluticasone-Salmeterol 250/50 bid, Epipen prn, Montelukast 10mg daily, and Albuterol II puffs q6hr prn.  

## 2012-11-28 ENCOUNTER — Encounter: Payer: Self-pay | Admitting: Internal Medicine

## 2012-12-05 LAB — CBC AND DIFFERENTIAL
Platelets: 413 10*3/uL — AB (ref 150–399)
WBC: 6.2 10^3/mL

## 2012-12-10 ENCOUNTER — Telehealth: Payer: Self-pay | Admitting: *Deleted

## 2012-12-10 NOTE — Telephone Encounter (Signed)
Per Dr. Cyndie Chime; notified [facility] [place] (pt in skilled nursing level now--@ extension [phone]) spoke with Duwayne Heck, [employer] fundraiser.  Instructed Danielle to continue same dose of Hydrea per MD.  Duwayne Heck verbalized understanding of instructions.

## 2013-01-01 ENCOUNTER — Non-Acute Institutional Stay (SKILLED_NURSING_FACILITY): Payer: Medicare Other | Admitting: Nurse Practitioner

## 2013-01-01 DIAGNOSIS — K219 Gastro-esophageal reflux disease without esophagitis: Secondary | ICD-10-CM

## 2013-01-01 DIAGNOSIS — F329 Major depressive disorder, single episode, unspecified: Secondary | ICD-10-CM

## 2013-01-01 DIAGNOSIS — F341 Dysthymic disorder: Secondary | ICD-10-CM

## 2013-01-01 DIAGNOSIS — I10 Essential (primary) hypertension: Secondary | ICD-10-CM

## 2013-01-01 DIAGNOSIS — F32A Depression, unspecified: Secondary | ICD-10-CM

## 2013-01-01 DIAGNOSIS — D62 Acute posthemorrhagic anemia: Secondary | ICD-10-CM

## 2013-01-01 DIAGNOSIS — D473 Essential (hemorrhagic) thrombocythemia: Secondary | ICD-10-CM

## 2013-01-01 DIAGNOSIS — M545 Low back pain, unspecified: Secondary | ICD-10-CM

## 2013-01-01 DIAGNOSIS — J449 Chronic obstructive pulmonary disease, unspecified: Secondary | ICD-10-CM

## 2013-01-01 DIAGNOSIS — F0390 Unspecified dementia without behavioral disturbance: Secondary | ICD-10-CM

## 2013-01-02 ENCOUNTER — Encounter: Payer: Self-pay | Admitting: Nurse Practitioner

## 2013-01-02 NOTE — Assessment & Plan Note (Signed)
Managed with Fluticasone-Salmeterol 250/50 bid, Epipen prn, Montelukast 10mg  daily, and Albuterol II puffs q6hr prn.

## 2013-01-02 NOTE — Progress Notes (Signed)
Patient ID: Sherri Crawford, female   DOB: 1920-04-18, 79 y.o.   MRN: [id number]  Code Status: Full Code.   Allergies  Allergen Reactions  . Sulfonamide Derivatives Other (See Comments)    Pt is unsure of reaction    Chief Complaint  Patient presents with  . Medical Managment of Chronic Issues    HPI: Patient is a 79 y.o. female seen in the SNF at [facility] today for evaluation of her other chronic medical conditions.  Problem List Items Addressed This Visit   Acute blood loss anemia - Primary     Off Iron-Hgb 11s              Anxiety and depression     Stable on Sertraline 25mg  daily.             BACK PAIN, LUMBAR     Hx of lower back pain, had X-ray of her lumbar spine 09/08/12--spondylosis-better controlled with Hydrocodone/ApAp  10/325mg  ac and hs and Flexeril 5mg  was changed to prn. 01/01/13 lumbar spine inj and Gibson Ramp was held 3 days prior the procedure.              COPD with asthma     Managed with Fluticasone-Salmeterol 250/50 bid, Epipen prn, Montelukast 10mg  daily, and Albuterol II puffs q6hr prn.               Dementia     Presently Namenda daily and SNF for care needs.               Essential hypertension     Controlled on Metoprolol 25mg  bid.               Essential thrombocythemia     Treated by Hematology, takes Hydrea 1000mg  Monday, 500mg  Wed, Fri, Sun. Plt 413 12/05/12          GERD     Stable on Omeprazole 20mg  daily.                Review of Systems:  Review of Systems  Constitutional: Negative for fever, chills, weight loss, malaise/fatigue and diaphoresis.  HENT: Positive for hearing loss. Negative for congestion, ear discharge, ear pain, nosebleeds, sore throat and tinnitus.   Eyes: Negative for blurred vision, double vision, photophobia, pain, discharge and redness.  Respiratory: Positive for cough and wheezing (occasional. ). Negative for hemoptysis, sputum  production, shortness of breath and stridor.   Cardiovascular: Negative for chest pain, palpitations, orthopnea, claudication and PND. Leg swelling: trace RLE-resolved.   Gastrointestinal: Positive for blood in stool. Negative for heartburn, nausea, vomiting, abdominal pain, diarrhea, constipation and melena.       Dysphagia.   Genitourinary: Positive for frequency. Negative for dysuria, urgency, hematuria and flank pain.  Musculoskeletal: Positive for joint pain (right hip). Negative for falls (recent and resulted in the right hip fx. ), myalgias and neck pain. Back pain: chronic back pain is managed with Norco.  Skin: Negative for itching and rash.       Itching scaly mid of R+L shins.   Neurological: Negative for dizziness, tingling, tremors, sensory change, speech change, focal weakness, seizures, loss of consciousness, weakness and headaches.  Endo/Heme/Allergies: Positive for environmental allergies. Negative for polydipsia. Bruises/bleeds easily.  Psychiatric/Behavioral: Positive for depression and memory loss. Negative for suicidal ideas, hallucinations and substance abuse. The patient is nervous/anxious. The patient does not have insomnia.      Past Medical History  Diagnosis Date  . Macular degeneration   .  COPD (chronic obstructive pulmonary disease)   . Hypertension   . Coronary artery disease   . Hypercholesterolemia   . GERD (gastroesophageal reflux disease)   . Asthmatic bronchitis   . History of colonic polyps   . DJD (degenerative joint disease)   . Lumbar back pain   . Osteoporosis   . History of transient ischemic attack (TIA)   . Anxiety   . Essential thrombocythemia   . Diverticulosis 05/10/2012  . Osteoarthrosis, unspecified whether generalized or localized, unspecified site 05/09/2012  . Memory loss 05/09/2012  . Abnormal weight gain 05/09/2012  . Phlebitis and thrombophlebitis of other deep vessels of lower extremities 07/10/2010  . Essential thrombocythemia  06/09/2009   Past Surgical History  Procedure Laterality Date  . Appendectomy    . Cholecystectomy    . Tah and bso  08/21/1995  . Cataract extraction w/ intraocular lens  implant, bilateral    . Abdominal hysterectomy    . Hip arthroplasty Right 09/09/2012    Procedure: ARTHROPLASTY BIPOLAR HIP;  Surgeon: Shelda Pal, MD;  Location: [facility];  Service: Orthopedics;  Laterality: Right;  . Esophagogastroduodenoscopy (egd) with esophageal dilation N/A 11/25/2012    Procedure: ESOPHAGOGASTRODUODENOSCOPY (EGD) WITH ESOPHAGEAL DILATION;  Surgeon: Beverley Fiedler, MD;  Location: [facility];  Service: Gastroenterology;  Laterality: N/A;   Social History:   reports that she has never smoked. She has never used smokeless tobacco. She reports that she does not drink alcohol or use illicit drugs.  Family History  Problem Relation Age of Onset  . Colon cancer Son 25  . Crohn's disease Son   . Cancer Maternal Aunt     Medications: Patient's Medications  New Prescriptions   No medications on file  Previous Medications   ALBUTEROL (PROAIR HFA) 108 (90 BASE) MCG/ACT INHALER    Inhale 2 puffs into the lungs every 6 (six) hours as needed for wheezing. As needed for wheezing   ATORVASTATIN (LIPITOR) 20 MG TABLET    Take 20 mg by mouth every evening.    CHOLECALCIFEROL (VITAMIN D) 1000 UNITS TABLET    Take 1,000 Units by mouth every morning.   EPIPEN 2-PAK 0.3 MG/0.3ML DEVI    Inject 0.3 mg into the skin once as needed (allergic reaction).    FEEDING SUPPLEMENT (RESOURCE BREEZE) LIQD    Take 1 Container by mouth 4 (four) times daily.   FERROUS SULFATE 325 (65 FE) MG TABLET    Take 325 mg by mouth 2 (two) times daily.   FLUTICASONE-SALMETEROL (ADVAIR) 250-50 MCG/DOSE AEPB    Inhale 1 puff into the lungs every 12 (twelve) hours.   HYDROCODONE-ACETAMINOPHEN (NORCO) 10-325 MG PER TABLET    Take 1 tablet by mouth 4 (four) times daily -  before meals and at bedtime.    HYDROXYUREA (HYDREA) 500 MG CAPSULE     Take 500 mg by mouth See admin instructions. Take 2 capsules on Monday. Take 1 capsule every other day of the week (Wed, Fri, Sun)   MEMANTINE (NAMENDA) 5 MG TABLET    Take 1 tablet (5 mg total) by mouth 2 (two) times daily.   METOPROLOL TARTRATE (LOPRESSOR) 25 MG TABLET    Take 25 mg by mouth 2 (two) times daily.   MONTELUKAST (SINGULAIR) 10 MG TABLET    Take 10 mg by mouth at bedtime.   MULTIPLE VITAMINS-MINERALS (CERTAVITE/ANTIOXIDANTS PO)    Take 1 capsule by mouth every morning.   NITROGLYCERIN (NITROSTAT) 0.4 MG SL TABLET  Place 0.4 mg under the tongue every 5 (five) minutes as needed for chest pain.    OMEPRAZOLE (PRILOSEC) 20 MG CAPSULE    Take 20 mg by mouth every morning.    RIVAROXABAN (XARELTO) 15 MG TABS TABLET    Take 15 mg by mouth daily.   SERTRALINE (ZOLOFT) 25 MG TABLET    Take 25 mg by mouth every evening.  Modified Medications   No medications on file  Discontinued Medications   No medications on file     Physical Exam: Physical Exam  Constitutional: She is oriented to person, place, and time. She appears well-developed and well-nourished. No distress.  HENT:  Head: Normocephalic and atraumatic.  Eyes: Conjunctivae and EOM are normal. Pupils are equal, round, and reactive to light.  Neck: Normal range of motion. Neck supple. No JVD present. No thyromegaly present.  Cardiovascular: Normal rate, regular rhythm and normal heart sounds.   Pulmonary/Chest: Effort normal. She has decreased breath sounds. She has no wheezes. She has no rales.  Abdominal: Soft. Bowel sounds are normal. There is no tenderness.  Musculoskeletal: Normal range of motion. She exhibits no edema. Tenderness: chronic lower back and the right hip since the fx. better controloled with scheduled Norco.   Lymphadenopathy:    She has no cervical adenopathy.  Neurological: She is alert and oriented to person, place, and time. She has normal reflexes. No cranial nerve deficit. She exhibits normal muscle  tone. Coordination normal.  Skin: Skin is warm and dry. No rash noted. She is not diaphoretic. No erythema (LLE).  The right hip surgical incision healed. Itching/scaly mid shins  Psychiatric: Her mood appears not anxious. Her affect is not angry, not blunt, not labile and not inappropriate. Her speech is not delayed, not tangential and not slurred. She is slowed. She is not agitated, not aggressive, not withdrawn and not combative. Thought content is not paranoid and not delusional. Cognition and memory are impaired. She does not express impulsivity or inappropriate judgment. She exhibits a depressed mood. She exhibits abnormal recent memory.    Filed Vitals:   01/02/13 1059  BP: 119/68  Pulse: 72  Temp: 97.8 F (36.6 C)  TempSrc: Tympanic  Resp: 16      Labs reviewed: Basic Metabolic Panel:  Recent Labs  16/10/96 1417  09/11/12 0455  10/18/12 11/19/12 1128 11/22/12 1800 11/22/12 2249 11/23/12 0435  NA 140  < > 136  < >  --  142 141  --  143  K 4.4  < > 4.2  < >  --  4.7 3.5  --  3.6  CL 106  < > 105  --   --   --  101  --  106  CO2 26  < > 26  --   --  26 24  --  22  GLUCOSE 128*  < > 132*  --   --  127 83  --  69*  BUN 23.0  < > 12  < >  --  17.6 17  --  14  CREATININE 1.1  < > 0.74  < >  --  0.9 0.74  --  0.75  CALCIUM 9.0  < > 8.4  --   --  10.1 9.9  --  8.5  TSH  --   --   --   --  2.81  --   --  0.129*  --   < > = values in this interval not displayed. Liver Function Tests:  Recent Labs  11/19/12 1128 11/22/12 1800 11/23/12 0435  AST 21 23 22   ALT 14 12 11   ALKPHOS 77 81 73  BILITOT 0.36 0.5 0.5  PROT 7.0 7.1 6.1  ALBUMIN 3.4* 3.7 3.0*   CBC:  Recent Labs  11/19/12 1128 11/22/12 1800 11/23/12 0435  11/24/12 0430 11/25/12 0505 11/26/12 0526 12/05/12  WBC 7.0 8.2 8.4  < > 10.3 7.5 7.1 6.2  NEUTROABS 5.8 6.6 6.9  --   --   --   --   --   HGB 12.7 13.6 12.4  < > 11.7* 11.5* 11.2* 11.9*  HCT 38.4 41.8 39.1  < > 36.2 35.9* 35.0* 35*  MCV 107.1*  108.0* 110.1*  < > 108.1* 107.2* 106.7*  --   PLT 386 347 316  < > 280 269 291 413*  < > = values in this interval not displayed.  Past Procedures:  09/09/12  CT head:  IMPRESSION: No skull fracture or intracranial hemorrhage.  Small vessel disease type changes without CT evidence of large acute infarct.  X-ray right hip  IMPRESSION: Right femoral prosthesis without acute complication  X-ray Lumbar spine  IMPRESSION: Diffuse lumbar spondylosis.  No evidence of fracture.   X--ray R hip  IMPRESSION: Displaced right subcapital hip fracture.  11/25/12 GED: Esophageal dysmotility w/o definitive stricture. S/p empiric dilation in the lower third of the esophagus and across GE junction with TTS balloon to 15mm. Recommended diet as tolerated.    Assessment/Plan Acute blood loss anemia Off Iron-Hgb 11s            Dementia Presently Namenda daily and SNF for care needs.             Essential hypertension Controlled on Metoprolol 25mg  bid.             Essential thrombocythemia Treated by Hematology, takes Hydrea 1000mg  Monday, 500mg  Wed, Fri, Sun. Plt 413 12/05/12        COPD with asthma Managed with Fluticasone-Salmeterol 250/50 bid, Epipen prn, Montelukast 10mg  daily, and Albuterol II puffs q6hr prn.             BACK PAIN, LUMBAR Hx of lower back pain, had X-ray of her lumbar spine 09/08/12--spondylosis-better controlled with Hydrocodone/ApAp  10/325mg  ac and hs and Flexeril 5mg  was changed to prn. 01/01/13 lumbar spine inj and Gibson Ramp was held 3 days prior the procedure.            GERD Stable on Omeprazole 20mg  daily.           Anxiety and depression Stable on Sertraline 25mg  daily.             Family/ Staff Communication: observe the patient  Goals of Care: failed AL and returned to SNF for care.   Labs/tests ordered: none.

## 2013-01-02 NOTE — Assessment & Plan Note (Signed)
Stable on Omeprazole 20 mg daily.

## 2013-01-02 NOTE — Assessment & Plan Note (Signed)
Presently Namenda daily and SNF for care needs.                  

## 2013-01-02 NOTE — Assessment & Plan Note (Signed)
Controlled on Metoprolol 25mg bid.    

## 2013-01-02 NOTE — Assessment & Plan Note (Signed)
Hx of lower back pain, had X-ray of her lumbar spine 09/08/12--spondylosis-better controlled with Hydrocodone/ApAp  10/325mg  ac and hs and Flexeril 5mg  was changed to prn. 01/01/13 lumbar spine inj and Gibson Ramp was held 3 days prior the procedure.

## 2013-01-02 NOTE — Assessment & Plan Note (Signed)
Treated by Hematology, takes Hydrea 1000mg  Monday, 500mg  Wed, Fri, Sun. Plt 413 12/05/12

## 2013-01-02 NOTE — Assessment & Plan Note (Signed)
Stable on Sertraline 25mg daily.    

## 2013-01-02 NOTE — Assessment & Plan Note (Signed)
Off Iron-Hgb 11s                 

## 2013-01-20 ENCOUNTER — Non-Acute Institutional Stay (SKILLED_NURSING_FACILITY): Payer: Medicare Other | Admitting: Nurse Practitioner

## 2013-01-20 ENCOUNTER — Encounter: Payer: Self-pay | Admitting: Nurse Practitioner

## 2013-01-20 DIAGNOSIS — F329 Major depressive disorder, single episode, unspecified: Secondary | ICD-10-CM

## 2013-01-20 DIAGNOSIS — M545 Low back pain, unspecified: Secondary | ICD-10-CM

## 2013-01-20 DIAGNOSIS — F341 Dysthymic disorder: Secondary | ICD-10-CM

## 2013-01-20 DIAGNOSIS — J4489 Other specified chronic obstructive pulmonary disease: Secondary | ICD-10-CM

## 2013-01-20 DIAGNOSIS — K219 Gastro-esophageal reflux disease without esophagitis: Secondary | ICD-10-CM

## 2013-01-20 DIAGNOSIS — D473 Essential (hemorrhagic) thrombocythemia: Secondary | ICD-10-CM

## 2013-01-20 DIAGNOSIS — F0390 Unspecified dementia without behavioral disturbance: Secondary | ICD-10-CM

## 2013-01-20 DIAGNOSIS — J449 Chronic obstructive pulmonary disease, unspecified: Secondary | ICD-10-CM

## 2013-01-20 DIAGNOSIS — F32A Depression, unspecified: Secondary | ICD-10-CM

## 2013-01-20 DIAGNOSIS — D62 Acute posthemorrhagic anemia: Secondary | ICD-10-CM

## 2013-01-20 DIAGNOSIS — I10 Essential (primary) hypertension: Secondary | ICD-10-CM

## 2013-01-20 NOTE — Assessment & Plan Note (Signed)
Stable on Omeprazole 20 mg daily.

## 2013-01-20 NOTE — Assessment & Plan Note (Signed)
Presently Namenda daily and SNF for care needs.                  

## 2013-01-20 NOTE — Assessment & Plan Note (Signed)
Off Iron-Hgb 11s                 

## 2013-01-20 NOTE — Assessment & Plan Note (Signed)
Hx of lower back pain, had X-ray of her lumbar spine 09/08/12--spondylosis-better controlled with Hydrocodone/ApAp  10/325mg ac and hs and Flexeril 5mg was changed to prn. 01/01/13 lumbar spine inj and Xeralto was held 3 days prior the procedure.           

## 2013-01-20 NOTE — Assessment & Plan Note (Signed)
Managed with Fluticasone-Salmeterol 250/50 bid, dc'd Epipen prn and Immunotherapy per [facility] 01/17/13, Montelukast 10mg  daily, and Albuterol II puffs q6hr prn. Prednisone tapering dose started 01/17/13--no audible wheezes but decreased breath as prior. Will continue to monitor

## 2013-01-20 NOTE — Assessment & Plan Note (Signed)
Controlled on Metoprolol 25mg bid.    

## 2013-01-20 NOTE — Assessment & Plan Note (Signed)
Stable on Sertraline 25mg daily.    

## 2013-01-20 NOTE — Assessment & Plan Note (Signed)
Treated by Hematology, takes Hydrea 1000mg Monday, 500mg Wed, Fri, Sun. Plt 413 12/05/12       

## 2013-01-20 NOTE — Progress Notes (Signed)
Patient ID: Sherri Crawford, female   DOB: [birth year], 79 y.o.   MRN: [id number]  Code Status: Full Code.   Allergies  Allergen Reactions  . Sulfonamide Derivatives Other (See Comments)    Pt is unsure of reaction    Chief Complaint  Patient presents with  . Medical Managment of Chronic Issues    wheezing cough  . Acute Visit    HPI: Patient is a 79 y.o. female seen in the SNF at [facility] today for evaluation of wheezing cough and her other chronic medical conditions.  Problem List Items Addressed This Visit   Acute blood loss anemia     Off Iron-Hgb 11s                Anxiety and depression     Stable on Sertraline 25mg  daily.               BACK PAIN, LUMBAR     Hx of lower back pain, had X-ray of her lumbar spine 09/08/12--spondylosis-better controlled with Hydrocodone/ApAp  10/325mg  ac and hs and Flexeril 5mg  was changed to prn. 01/01/13 lumbar spine inj and Gibson Ramp was held 3 days prior the procedure.                COPD with asthma     Managed with Fluticasone-Salmeterol 250/50 bid, dc'd Epipen prn and Immunotherapy per [facility] 01/17/13, Montelukast 10mg  daily, and Albuterol II puffs q6hr prn. Prednisone tapering dose started 01/17/13--no audible wheezes but decreased breath as prior. Will continue to monitor                Dementia     Presently Namenda daily and SNF for care needs.                 Essential hypertension     Controlled on Metoprolol 25mg  bid.                 Essential thrombocythemia - Primary     Treated by Hematology, takes Hydrea 1000mg  Monday, 500mg  Wed, Fri, Sun. Plt 413 12/05/12            GERD     Stable on Omeprazole 20mg  daily.                  Review of Systems:  Review of Systems  Constitutional: Negative for fever, chills, weight loss, malaise/fatigue and diaphoresis.  HENT: Positive for hearing loss. Negative for  congestion, ear discharge, ear pain, nosebleeds, sore throat and tinnitus.   Eyes: Negative for blurred vision, double vision, photophobia, pain, discharge and redness.  Respiratory: Positive for cough. Negative for hemoptysis, sputum production, shortness of breath, wheezing (occasional. ) and stridor.   Cardiovascular: Negative for chest pain, palpitations, orthopnea, claudication and PND. Leg swelling: trace RLE-resolved.   Gastrointestinal: Negative for heartburn, nausea, vomiting, abdominal pain, diarrhea, constipation, blood in stool and melena.       Dysphagia.   Genitourinary: Positive for frequency. Negative for dysuria, urgency, hematuria and flank pain.  Musculoskeletal: Positive for joint pain (right hip). Negative for falls (recent and resulted in the right hip fx. ), myalgias and neck pain. Back pain: chronic back pain is managed with Norco.       Ambulates with walker.   Skin: Negative for itching and rash.       Itching scaly mid of R+L shins.   Neurological: Negative for dizziness, tingling, tremors, sensory change, speech change, focal weakness, seizures, loss  of consciousness, weakness and headaches.  Endo/Heme/Allergies: Positive for environmental allergies. Negative for polydipsia. Bruises/bleeds easily.  Psychiatric/Behavioral: Positive for depression and memory loss. Negative for suicidal ideas, hallucinations and substance abuse. The patient is nervous/anxious. The patient does not have insomnia.      Past Medical History  Diagnosis Date  . Macular degeneration   . COPD (chronic obstructive pulmonary disease)   . Hypertension   . Coronary artery disease   . Hypercholesterolemia   . GERD (gastroesophageal reflux disease)   . Asthmatic bronchitis   . History of colonic polyps   . DJD (degenerative joint disease)   . Lumbar back pain   . Osteoporosis   . History of transient ischemic attack (TIA)   . Anxiety   . Essential thrombocythemia   . Diverticulosis  05/10/2012  . Osteoarthrosis, unspecified whether generalized or localized, unspecified site 05/09/2012  . Memory loss 05/09/2012  . Abnormal weight gain 05/09/2012  . Phlebitis and thrombophlebitis of other deep vessels of lower extremities 07/10/2010  . Essential thrombocythemia 06/09/2009   Past Surgical History  Procedure Laterality Date  . Appendectomy    . Cholecystectomy    . Tah and bso  08/21/1995  . Cataract extraction w/ intraocular lens  implant, bilateral    . Abdominal hysterectomy    . Hip arthroplasty Right 09/09/2012    Procedure: ARTHROPLASTY BIPOLAR HIP;  Surgeon: Shelda Pal, MD;  Location: [facility];  Service: Orthopedics;  Laterality: Right;  . Esophagogastroduodenoscopy (egd) with esophageal dilation N/A 11/25/2012    Procedure: ESOPHAGOGASTRODUODENOSCOPY (EGD) WITH ESOPHAGEAL DILATION;  Surgeon: Beverley Fiedler, MD;  Location: [facility];  Service: Gastroenterology;  Laterality: N/A;   Social History:   reports that she has never smoked. She has never used smokeless tobacco. She reports that she does not drink alcohol or use illicit drugs.  Family History  Problem Relation Age of Onset  . Colon cancer Son 25  . Crohn's disease Son   . Cancer Maternal Aunt     Medications: Patient's Medications  New Prescriptions   No medications on file  Previous Medications   ALBUTEROL (PROAIR HFA) 108 (90 BASE) MCG/ACT INHALER    Inhale 2 puffs into the lungs every 6 (six) hours as needed for wheezing. As needed for wheezing   ATORVASTATIN (LIPITOR) 20 MG TABLET    Take 20 mg by mouth every evening.    CHOLECALCIFEROL (VITAMIN D) 1000 UNITS TABLET    Take 1,000 Units by mouth every morning.   EPIPEN 2-PAK 0.3 MG/0.3ML DEVI    Inject 0.3 mg into the skin once as needed (allergic reaction).    FEEDING SUPPLEMENT (RESOURCE BREEZE) LIQD    Take 1 Container by mouth 4 (four) times daily.   FERROUS SULFATE 325 (65 FE) MG TABLET    Take 325 mg by mouth 2 (two) times daily.    FLUTICASONE-SALMETEROL (ADVAIR) 250-50 MCG/DOSE AEPB    Inhale 1 puff into the lungs every 12 (twelve) hours.   HYDROCODONE-ACETAMINOPHEN (NORCO) 10-325 MG PER TABLET    Take 1 tablet by mouth 4 (four) times daily -  before meals and at bedtime.    HYDROXYUREA (HYDREA) 500 MG CAPSULE    Take 500 mg by mouth See admin instructions. Take 2 capsules on Monday. Take 1 capsule every other day of the week (Wed, Fri, Sun)   MEMANTINE (NAMENDA) 5 MG TABLET    Take 1 tablet (5 mg total) by mouth 2 (two) times daily.   METOPROLOL TARTRATE (  LOPRESSOR) 25 MG TABLET    Take 25 mg by mouth 2 (two) times daily.   MONTELUKAST (SINGULAIR) 10 MG TABLET    Take 10 mg by mouth at bedtime.   MULTIPLE VITAMINS-MINERALS (CERTAVITE/ANTIOXIDANTS PO)    Take 1 capsule by mouth every morning.   NITROGLYCERIN (NITROSTAT) 0.4 MG SL TABLET    Place 0.4 mg under the tongue every 5 (five) minutes as needed for chest pain.    OMEPRAZOLE (PRILOSEC) 20 MG CAPSULE    Take 20 mg by mouth every morning.    RIVAROXABAN (XARELTO) 15 MG TABS TABLET    Take 15 mg by mouth daily.   SERTRALINE (ZOLOFT) 25 MG TABLET    Take 25 mg by mouth every evening.  Modified Medications   No medications on file  Discontinued Medications   No medications on file     Physical Exam: Physical Exam  Constitutional: She is oriented to person, place, and time. She appears well-developed and well-nourished. No distress.  HENT:  Head: Normocephalic and atraumatic.  Eyes: Conjunctivae and EOM are normal. Pupils are equal, round, and reactive to light.  Neck: Normal range of motion. Neck supple. No JVD present. No thyromegaly present.  Cardiovascular: Normal rate, regular rhythm and normal heart sounds.   Pulmonary/Chest: Effort normal. She has decreased breath sounds. She has no wheezes. She has no rales.  Decreased breath sounds Bilat. Lungs.   Abdominal: Soft. Bowel sounds are normal. There is no tenderness.  Musculoskeletal: Normal range of motion.  She exhibits no edema. Tenderness: chronic lower back and the right hip since the fx. better controloled with scheduled Norco.   Lymphadenopathy:    She has no cervical adenopathy.  Neurological: She is alert and oriented to person, place, and time. She has normal reflexes. No cranial nerve deficit. She exhibits normal muscle tone. Coordination normal.  Skin: Skin is warm and dry. No rash noted. She is not diaphoretic. No erythema (LLE).  The right hip surgical incision healed. Itching/scaly mid shins  Psychiatric: Her mood appears not anxious. Her affect is not angry, not blunt, not labile and not inappropriate. Her speech is not delayed, not tangential and not slurred. She is slowed. She is not agitated, not aggressive, not withdrawn and not combative. Thought content is not paranoid and not delusional. Cognition and memory are impaired. She does not express impulsivity or inappropriate judgment. She exhibits a depressed mood. She exhibits abnormal recent memory.    Filed Vitals:   01/20/13 1242  BP: 119/68  Pulse: 72  Temp: 97.6 F (36.4 C)  TempSrc: Tympanic  Resp: 16      Labs reviewed: Basic Metabolic Panel:  Recent Labs  40/98/11 1417  09/11/12 0455  10/18/12 11/19/12 1128 11/22/12 1800 11/22/12 2249 11/23/12 0435  NA 140  < > 136  < >  --  142 141  --  143  K 4.4  < > 4.2  < >  --  4.7 3.5  --  3.6  CL 106  < > 105  --   --   --  101  --  106  CO2 26  < > 26  --   --  26 24  --  22  GLUCOSE 128*  < > 132*  --   --  127 83  --  69*  BUN 23.0  < > 12  < >  --  17.6 17  --  14  CREATININE 1.1  < > 0.74  < >  --  0.9 0.74  --  0.75  CALCIUM 9.0  < > 8.4  --   --  10.1 9.9  --  8.5  TSH  --   --   --   --  2.81  --   --  0.129*  --   < > = values in this interval not displayed. Liver Function Tests:  Recent Labs  11/19/12 1128 11/22/12 1800 11/23/12 0435  AST 21 23 22   ALT 14 12 11   ALKPHOS 77 81 73  BILITOT 0.36 0.5 0.5  PROT 7.0 7.1 6.1  ALBUMIN 3.4* 3.7  3.0*   CBC:  Recent Labs  11/19/12 1128 11/22/12 1800 11/23/12 0435  11/24/12 0430 11/25/12 0505 11/26/12 0526 12/05/12  WBC 7.0 8.2 8.4  < > 10.3 7.5 7.1 6.2  NEUTROABS 5.8 6.6 6.9  --   --   --   --   --   HGB 12.7 13.6 12.4  < > 11.7* 11.5* 11.2* 11.9*  HCT 38.4 41.8 39.1  < > 36.2 35.9* 35.0* 35*  MCV 107.1* 108.0* 110.1*  < > 108.1* 107.2* 106.7*  --   PLT 386 347 316  < > 280 269 291 413*  < > = values in this interval not displayed.  Past Procedures:  09/09/12  CT head:  IMPRESSION: No skull fracture or intracranial hemorrhage.  Small vessel disease type changes without CT evidence of large acute infarct.  X-ray right hip  IMPRESSION: Right femoral prosthesis without acute complication  X-ray Lumbar spine  IMPRESSION: Diffuse lumbar spondylosis.  No evidence of fracture.   X--ray R hip  IMPRESSION: Displaced right subcapital hip fracture.  11/25/12 GED: Esophageal dysmotility w/o definitive stricture. S/p empiric dilation in the lower third of the esophagus and across GE junction with TTS balloon to 15mm. Recommended diet as tolerated.    Assessment/Plan Essential thrombocythemia Treated by Hematology, takes Hydrea 1000mg  Monday, 500mg  Wed, Fri, Sun. Plt 413 12/05/12          GERD Stable on Omeprazole 20mg  daily.             Dementia Presently Namenda daily and SNF for care needs.               Anxiety and depression Stable on Sertraline 25mg  daily.             BACK PAIN, LUMBAR Hx of lower back pain, had X-ray of her lumbar spine 09/08/12--spondylosis-better controlled with Hydrocodone/ApAp  10/325mg  ac and hs and Flexeril 5mg  was changed to prn. 01/01/13 lumbar spine inj and Gibson Ramp was held 3 days prior the procedure.              Acute blood loss anemia Off Iron-Hgb 11s              Essential hypertension Controlled on Metoprolol 25mg  bid.               COPD with  asthma Managed with Fluticasone-Salmeterol 250/50 bid, dc'd Epipen prn and Immunotherapy per [facility] 01/17/13, Montelukast 10mg  daily, and Albuterol II puffs q6hr prn. Prednisone tapering dose started 01/17/13--no audible wheezes but decreased breath as prior. Will continue to monitor                Family/ Staff Communication: observe the patient  Goals of Care: failed AL and returned to SNF for care.   Labs/tests ordered: none.

## 2013-02-06 ENCOUNTER — Encounter: Payer: Medicare Other | Admitting: Internal Medicine

## 2013-02-10 ENCOUNTER — Non-Acute Institutional Stay (SKILLED_NURSING_FACILITY): Payer: Medicare Other | Admitting: Nurse Practitioner

## 2013-02-10 ENCOUNTER — Encounter: Payer: Self-pay | Admitting: Nurse Practitioner

## 2013-02-10 DIAGNOSIS — F0390 Unspecified dementia without behavioral disturbance: Secondary | ICD-10-CM

## 2013-02-10 DIAGNOSIS — F32A Depression, unspecified: Secondary | ICD-10-CM

## 2013-02-10 DIAGNOSIS — F329 Major depressive disorder, single episode, unspecified: Secondary | ICD-10-CM

## 2013-02-10 DIAGNOSIS — M545 Low back pain, unspecified: Secondary | ICD-10-CM

## 2013-02-10 DIAGNOSIS — I10 Essential (primary) hypertension: Secondary | ICD-10-CM

## 2013-02-10 DIAGNOSIS — K219 Gastro-esophageal reflux disease without esophagitis: Secondary | ICD-10-CM

## 2013-02-10 DIAGNOSIS — I2510 Atherosclerotic heart disease of native coronary artery without angina pectoris: Secondary | ICD-10-CM

## 2013-02-10 DIAGNOSIS — J4489 Other specified chronic obstructive pulmonary disease: Secondary | ICD-10-CM

## 2013-02-10 DIAGNOSIS — D62 Acute posthemorrhagic anemia: Secondary | ICD-10-CM

## 2013-02-10 DIAGNOSIS — F341 Dysthymic disorder: Secondary | ICD-10-CM

## 2013-02-10 DIAGNOSIS — J449 Chronic obstructive pulmonary disease, unspecified: Secondary | ICD-10-CM

## 2013-02-10 DIAGNOSIS — D473 Essential (hemorrhagic) thrombocythemia: Secondary | ICD-10-CM

## 2013-02-10 NOTE — Progress Notes (Signed)
Patient ID: Sherri Crawford, female   DOB: 06/12/1920, 79 y.o.   MRN: [id number]    Patient ID: Sherri Crawford, female   DOB: February 20, 1921, 79 y.o.   MRN: [id number]  Code Status: Full Code.   Allergies  Allergen Reactions  . Sulfonamide Derivatives Other (See Comments)    Pt is unsure of reaction    Chief Complaint  Patient presents with  . Medical Managment of Chronic Issues    HPI: Patient is a 79 y.o. female seen in the SNF at [facility] today for evaluation of her other chronic medical conditions.  Problem List Items Addressed This Visit   Essential thrombocythemia     Treated by Hematology, takes Hydrea 1000mg  Monday, 500mg  Wed, Fri, Sun. Plt 413 12/05/12. Chronic anticoagulation with Xarelto.               Essential hypertension     Controlled on Metoprolol 25mg  bid.                   CORONARY ARTERY DISEASE     Presently no angina--prn NTG, daily ASA 81mg , Atorvastatin 20mg  for risk reduction.         COPD with asthma     Managed with Fluticasone-Salmeterol 250/50 bid, dc'd Epipen prn and Immunotherapy per [facility] 01/17/13, Montelukast 10mg  daily, and Albuterol II puffs q6hr prn. Will continue to monitor                  GERD     Stable on Omeprazole 20mg  daily.                 BACK PAIN, LUMBAR     Hx of lower back pain, had X-ray of her lumbar spine 09/08/12--spondylosis-better controlled with Hydrocodone/ApAp  10/325mg  ac and hs  01/01/13 lumbar spine inj                  Anxiety and depression     Stable on Sertraline 25mg  daily.                 Acute blood loss anemia     Off Iron-Hgb 11s                  Dementia - Primary     Presently Namenda daily and SNF for care needs.                      Review of Systems:  Review of Systems  Constitutional: Negative for fever, chills, weight loss, malaise/fatigue and diaphoresis.  HENT:  Positive for hearing loss. Negative for congestion, ear discharge, ear pain, nosebleeds, sore throat and tinnitus.   Eyes: Negative for blurred vision, double vision, photophobia, pain, discharge and redness.  Respiratory: Positive for cough. Negative for hemoptysis, sputum production, shortness of breath, wheezing (occasional. ) and stridor.   Cardiovascular: Negative for chest pain, palpitations, orthopnea, claudication and PND. Leg swelling: trace RLE-resolved.   Gastrointestinal: Negative for heartburn, nausea, vomiting, abdominal pain, diarrhea, constipation, blood in stool and melena.       Dysphagia.   Genitourinary: Positive for frequency. Negative for dysuria, urgency, hematuria and flank pain.  Musculoskeletal: Positive for joint pain (right hip). Negative for falls (recent and resulted in the right hip fx. ), myalgias and neck pain. Back pain: chronic back pain is managed with Norco.       Ambulates with walker.   Skin: Negative for itching and rash.  Itching scaly mid of R+L shins.   Neurological: Negative for dizziness, tingling, tremors, sensory change, speech change, focal weakness, seizures, loss of consciousness, weakness and headaches.  Endo/Heme/Allergies: Positive for environmental allergies. Negative for polydipsia. Bruises/bleeds easily.  Psychiatric/Behavioral: Positive for depression and memory loss. Negative for suicidal ideas, hallucinations and substance abuse. The patient is nervous/anxious. The patient does not have insomnia.      Past Medical History  Diagnosis Date  . Macular degeneration   . COPD (chronic obstructive pulmonary disease)   . Hypertension   . Coronary artery disease   . Hypercholesterolemia   . GERD (gastroesophageal reflux disease)   . Asthmatic bronchitis   . History of colonic polyps   . DJD (degenerative joint disease)   . Lumbar back pain   . Osteoporosis   . History of transient ischemic attack (TIA)   . Anxiety   . Essential  thrombocythemia   . Diverticulosis 05/10/2012  . Osteoarthrosis, unspecified whether generalized or localized, unspecified site 05/09/2012  . Memory loss 05/09/2012  . Abnormal weight gain 05/09/2012  . Phlebitis and thrombophlebitis of other deep vessels of lower extremities 07/10/2010  . Essential thrombocythemia 06/09/2009   Past Surgical History  Procedure Laterality Date  . Appendectomy    . Cholecystectomy    . Tah and bso  08/21/1995  . Cataract extraction w/ intraocular lens  implant, bilateral    . Abdominal hysterectomy    . Hip arthroplasty Right 09/09/2012    Procedure: ARTHROPLASTY BIPOLAR HIP;  Surgeon: Shelda Pal, MD;  Location: [facility];  Service: Orthopedics;  Laterality: Right;  . Esophagogastroduodenoscopy (egd) with esophageal dilation N/A 11/25/2012    Procedure: ESOPHAGOGASTRODUODENOSCOPY (EGD) WITH ESOPHAGEAL DILATION;  Surgeon: Beverley Fiedler, MD;  Location: [facility];  Service: Gastroenterology;  Laterality: N/A;   Social History:   reports that she has never smoked. She has never used smokeless tobacco. She reports that she does not drink alcohol or use illicit drugs.  Family History  Problem Relation Age of Onset  . Colon cancer Son 31  . Crohn's disease Son   . Cancer Maternal Aunt     Medications: Patient's Medications  New Prescriptions   No medications on file  Previous Medications   ALBUTEROL (PROAIR HFA) 108 (90 BASE) MCG/ACT INHALER    Inhale 2 puffs into the lungs every 6 (six) hours as needed for wheezing. As needed for wheezing   ATORVASTATIN (LIPITOR) 20 MG TABLET    Take 20 mg by mouth every evening.    CHOLECALCIFEROL (VITAMIN D) 1000 UNITS TABLET    Take 1,000 Units by mouth every morning.   FEEDING SUPPLEMENT (RESOURCE BREEZE) LIQD    Take 1 Container by mouth 4 (four) times daily.   FLUTICASONE-SALMETEROL (ADVAIR) 250-50 MCG/DOSE AEPB    Inhale 1 puff into the lungs every 12 (twelve) hours.   HYDROCODONE-ACETAMINOPHEN (NORCO) 10-325 MG PER  TABLET    Take 1 tablet by mouth 4 (four) times daily -  before meals and at bedtime.    HYDROXYUREA (HYDREA) 500 MG CAPSULE    Take 500 mg by mouth See admin instructions. Take 2 capsules on Monday. Take 1 capsule every other day of the week (Wed, Fri, Sun)   MEMANTINE (NAMENDA) 5 MG TABLET    Take 1 tablet (5 mg total) by mouth 2 (two) times daily.   METOPROLOL TARTRATE (LOPRESSOR) 25 MG TABLET    Take 25 mg by mouth 2 (two) times daily.   MONTELUKAST (SINGULAIR) 10  MG TABLET    Take 10 mg by mouth at bedtime.   MULTIPLE VITAMINS-MINERALS (CERTAVITE/ANTIOXIDANTS PO)    Take 1 capsule by mouth every morning.   NITROGLYCERIN (NITROSTAT) 0.4 MG SL TABLET    Place 0.4 mg under the tongue every 5 (five) minutes as needed for chest pain.    OMEPRAZOLE (PRILOSEC) 20 MG CAPSULE    Take 20 mg by mouth every morning.    RIVAROXABAN (XARELTO) 15 MG TABS TABLET    Take 15 mg by mouth daily.   SERTRALINE (ZOLOFT) 25 MG TABLET    Take 25 mg by mouth every evening.  Modified Medications   No medications on file  Discontinued Medications   EPIPEN 2-PAK 0.3 MG/0.3ML DEVI    Inject 0.3 mg into the skin once as needed (allergic reaction).    FERROUS SULFATE 325 (65 FE) MG TABLET    Take 325 mg by mouth 2 (two) times daily.     Physical Exam: Physical Exam  Constitutional: She is oriented to person, place, and time. She appears well-developed and well-nourished. No distress.  HENT:  Head: Normocephalic and atraumatic.  Eyes: Conjunctivae and EOM are normal. Pupils are equal, round, and reactive to light.  Neck: Normal range of motion. Neck supple. No JVD present. No thyromegaly present.  Cardiovascular: Normal rate, regular rhythm and normal heart sounds.   Pulmonary/Chest: Effort normal. She has decreased breath sounds. She has no wheezes. She has no rales.  Decreased breath sounds Bilat. Lungs.   Abdominal: Soft. Bowel sounds are normal. There is no tenderness.  Musculoskeletal: Normal range of motion.  She exhibits no edema. Tenderness: chronic lower back and the right hip since the fx. better controloled with scheduled Norco.   Lymphadenopathy:    She has no cervical adenopathy.  Neurological: She is alert and oriented to person, place, and time. She has normal reflexes. No cranial nerve deficit. She exhibits normal muscle tone. Coordination normal.  Skin: Skin is warm and dry. No rash noted. She is not diaphoretic. No erythema (LLE).  The right hip surgical incision healed. Itching/scaly mid shins  Psychiatric: Her mood appears not anxious. Her affect is not angry, not blunt, not labile and not inappropriate. Her speech is not delayed, not tangential and not slurred. She is slowed. She is not agitated, not aggressive, not withdrawn and not combative. Thought content is not paranoid and not delusional. Cognition and memory are impaired. She does not express impulsivity or inappropriate judgment. She exhibits a depressed mood. She exhibits abnormal recent memory.    Filed Vitals:   02/10/13 1515  BP: 132/80  Pulse: 76  Temp: 98.6 F (37 C)  TempSrc: Tympanic  Resp: 20      Labs reviewed: Basic Metabolic Panel:  Recent Labs  16/10/96 1417  09/11/12 0455  10/18/12 11/19/12 1128 11/22/12 1800 11/22/12 2249 11/23/12 0435  NA 140  < > 136  < >  --  142 141  --  143  K 4.4  < > 4.2  < >  --  4.7 3.5  --  3.6  CL 106  < > 105  --   --   --  101  --  106  CO2 26  < > 26  --   --  26 24  --  22  GLUCOSE 128*  < > 132*  --   --  127 83  --  69*  BUN 23.0  < > 12  < >  --  17.6 17  --  14  CREATININE 1.1  < > 0.74  < >  --  0.9 0.74  --  0.75  CALCIUM 9.0  < > 8.4  --   --  10.1 9.9  --  8.5  TSH  --   --   --   --  2.81  --   --  0.129*  --   < > = values in this interval not displayed. Liver Function Tests:  Recent Labs  11/19/12 1128 11/22/12 1800 11/23/12 0435  AST 21 23 22   ALT 14 12 11   ALKPHOS 77 81 73  BILITOT 0.36 0.5 0.5  PROT 7.0 7.1 6.1  ALBUMIN 3.4* 3.7 3.0*     CBC:  Recent Labs  11/19/12 1128 11/22/12 1800 11/23/12 0435  11/24/12 0430 11/25/12 0505 11/26/12 0526 12/05/12  WBC 7.0 8.2 8.4  < > 10.3 7.5 7.1 6.2  NEUTROABS 5.8 6.6 6.9  --   --   --   --   --   HGB 12.7 13.6 12.4  < > 11.7* 11.5* 11.2* 11.9*  HCT 38.4 41.8 39.1  < > 36.2 35.9* 35.0* 35*  MCV 107.1* 108.0* 110.1*  < > 108.1* 107.2* 106.7*  --   PLT 386 347 316  < > 280 269 291 413*  < > = values in this interval not displayed.  Past Procedures:  09/09/12  CT head:  IMPRESSION: No skull fracture or intracranial hemorrhage.  Small vessel disease type changes without CT evidence of large acute infarct.  X-ray right hip  IMPRESSION: Right femoral prosthesis without acute complication  X-ray Lumbar spine  IMPRESSION: Diffuse lumbar spondylosis.  No evidence of fracture.   X--ray R hip  IMPRESSION: Displaced right subcapital hip fracture.  11/25/12 GED: Esophageal dysmotility w/o definitive stricture. S/p empiric dilation in the lower third of the esophagus and across GE junction with TTS balloon to 15mm. Recommended diet as tolerated.    Assessment/Plan Dementia Presently Namenda daily and SNF for care needs.                 Acute blood loss anemia Off Iron-Hgb 11s                Anxiety and depression Stable on Sertraline 25mg  daily.               BACK PAIN, LUMBAR Hx of lower back pain, had X-ray of her lumbar spine 09/08/12--spondylosis-better controlled with Hydrocodone/ApAp  10/325mg  ac and hs  01/01/13 lumbar spine inj                GERD Stable on Omeprazole 20mg  daily.               CORONARY ARTERY DISEASE Presently no angina--prn NTG, daily ASA 81mg , Atorvastatin 20mg  for risk reduction.       COPD with asthma Managed with Fluticasone-Salmeterol 250/50 bid, dc'd Epipen prn and Immunotherapy per [facility] 01/17/13, Montelukast 10mg  daily, and Albuterol II puffs q6hr  prn. Will continue to monitor                Essential hypertension Controlled on Metoprolol 25mg  bid.                 Essential thrombocythemia Treated by Hematology, takes Hydrea 1000mg  Monday, 500mg  Wed, Fri, Sun. Plt 413 12/05/12. Chronic anticoagulation with Xarelto.               Family/ Staff Communication: observe the  patient  Goals of Care: failed AL and returned to SNF for care.   Labs/tests ordered: none.

## 2013-02-10 NOTE — Assessment & Plan Note (Signed)
Stable on Sertraline 25mg daily.    

## 2013-02-10 NOTE — Assessment & Plan Note (Signed)
Controlled on Metoprolol 25mg bid.    

## 2013-02-10 NOTE — Assessment & Plan Note (Signed)
Presently Namenda daily and SNF for care needs.                  

## 2013-02-10 NOTE — Assessment & Plan Note (Signed)
Treated by Hematology, takes Hydrea 1000mg Monday, 500mg Wed, Fri, Sun. Plt 413 12/05/12. Chronic anticoagulation with Xarelto.              

## 2013-02-10 NOTE — Assessment & Plan Note (Signed)
Presently no angina--prn NTG, daily ASA 81mg, Atorvastatin 20mg for risk reduction.        

## 2013-02-10 NOTE — Assessment & Plan Note (Signed)
Stable on Omeprazole 20 mg daily.

## 2013-02-10 NOTE — Assessment & Plan Note (Signed)
Hx of lower back pain, had X-ray of her lumbar spine 09/08/12--spondylosis-better controlled with Hydrocodone/ApAp  10/325mg ac and hs  01/01/13 lumbar spine inj    

## 2013-02-10 NOTE — Assessment & Plan Note (Signed)
Off Iron-Hgb 11s                 

## 2013-02-10 NOTE — Assessment & Plan Note (Signed)
Managed with Fluticasone-Salmeterol 250/50 bid, dc'd Epipen prn and Immunotherapy per [facility] 01/17/13, Montelukast 10mg  daily, and Albuterol II puffs q6hr prn. Will continue to monitor

## 2013-03-12 ENCOUNTER — Encounter: Payer: Self-pay | Admitting: Nurse Practitioner

## 2013-03-12 ENCOUNTER — Non-Acute Institutional Stay (SKILLED_NURSING_FACILITY): Payer: Medicare Other | Admitting: Nurse Practitioner

## 2013-03-12 DIAGNOSIS — K219 Gastro-esophageal reflux disease without esophagitis: Secondary | ICD-10-CM

## 2013-03-12 DIAGNOSIS — F32A Depression, unspecified: Secondary | ICD-10-CM

## 2013-03-12 DIAGNOSIS — I82402 Acute embolism and thrombosis of unspecified deep veins of left lower extremity: Secondary | ICD-10-CM

## 2013-03-12 DIAGNOSIS — D473 Essential (hemorrhagic) thrombocythemia: Secondary | ICD-10-CM

## 2013-03-12 DIAGNOSIS — F419 Anxiety disorder, unspecified: Secondary | ICD-10-CM

## 2013-03-12 DIAGNOSIS — D62 Acute posthemorrhagic anemia: Secondary | ICD-10-CM

## 2013-03-12 DIAGNOSIS — F0390 Unspecified dementia without behavioral disturbance: Secondary | ICD-10-CM

## 2013-03-12 DIAGNOSIS — M545 Low back pain, unspecified: Secondary | ICD-10-CM

## 2013-03-12 DIAGNOSIS — J4489 Other specified chronic obstructive pulmonary disease: Secondary | ICD-10-CM

## 2013-03-12 DIAGNOSIS — I10 Essential (primary) hypertension: Secondary | ICD-10-CM

## 2013-03-12 DIAGNOSIS — F329 Major depressive disorder, single episode, unspecified: Secondary | ICD-10-CM

## 2013-03-12 DIAGNOSIS — J449 Chronic obstructive pulmonary disease, unspecified: Secondary | ICD-10-CM

## 2013-03-12 DIAGNOSIS — F341 Dysthymic disorder: Secondary | ICD-10-CM

## 2013-03-12 DIAGNOSIS — I2510 Atherosclerotic heart disease of native coronary artery without angina pectoris: Secondary | ICD-10-CM

## 2013-03-12 DIAGNOSIS — I82409 Acute embolism and thrombosis of unspecified deep veins of unspecified lower extremity: Secondary | ICD-10-CM

## 2013-03-12 NOTE — Assessment & Plan Note (Signed)
Presently Namenda daily and SNF for care needs.

## 2013-03-12 NOTE — Assessment & Plan Note (Signed)
Controlled on Metoprolol 25mg bid.    

## 2013-03-12 NOTE — Assessment & Plan Note (Signed)
Stable on Sertraline 25mg daily.    

## 2013-03-12 NOTE — Assessment & Plan Note (Signed)
Off Iron-Hgb 11s

## 2013-03-12 NOTE — Assessment & Plan Note (Addendum)
Managed with Fluticasone-Salmeterol 250/50 bid, dc'd Epipen prn and Immunotherapy per  [facility] 01/17/13, Montelukast 10mg daily, and Albuterol II puffs q6hr prn. Stable 

## 2013-03-12 NOTE — Assessment & Plan Note (Signed)
Switched to [place] since 09/12/12. Left   

## 2013-03-12 NOTE — Assessment & Plan Note (Signed)
Treated by Hematology, takes Hydrea 1000mg  Monday, 500mg  Wed, Fri, Sun. Plt 413 12/05/12. Chronic anticoagulation with Xarelto.

## 2013-03-12 NOTE — Assessment & Plan Note (Signed)
Presently no angina--prn NTG, daily ASA 81mg , Atorvastatin 20mg  for risk reduction.

## 2013-03-12 NOTE — Assessment & Plan Note (Signed)
Stable on Omeprazole 20 mg daily.

## 2013-03-12 NOTE — Progress Notes (Signed)
Patient ID: Sherri Crawford, female   DOB: [birth year], 80 y.o.   MRN: [id number]    Patient ID: Sherri Crawford, female   DOB: August 06, 1920, 80 y.o.   MRN: [id number]  Code Status: Full Code.   Allergies  Allergen Reactions  . Sulfonamide Derivatives Other (See Comments)    Pt is unsure of reaction    Chief Complaint  Patient presents with  . Medical Managment of Chronic Issues    HPI: Patient is a 80 y.o. female seen in the SNF at [facility] today for evaluation of her other chronic medical conditions.  Problem List Items Addressed This Visit   None      Review of Systems:  Review of Systems  Constitutional: Negative for fever, chills, weight loss, malaise/fatigue and diaphoresis.  HENT: Positive for hearing loss. Negative for congestion, ear discharge, ear pain, nosebleeds, sore throat and tinnitus.   Eyes: Negative for blurred vision, double vision, photophobia, pain, discharge and redness.  Respiratory: Positive for cough. Negative for hemoptysis, sputum production, shortness of breath, wheezing (occasional. ) and stridor.   Cardiovascular: Negative for chest pain, palpitations, orthopnea, claudication and PND. Leg swelling: trace RLE-resolved.   Gastrointestinal: Negative for heartburn, nausea, vomiting, abdominal pain, diarrhea, constipation, blood in stool and melena.       Dysphagia.   Genitourinary: Positive for frequency. Negative for dysuria, urgency, hematuria and flank pain.  Musculoskeletal: Positive for joint pain (right hip). Negative for falls (recent and resulted in the right hip fx. ), myalgias and neck pain. Back pain: chronic back pain is managed with Norco.       Ambulates with walker.   Skin: Negative for itching and rash.       Itching scaly mid of R+L shins.   Neurological: Negative for dizziness, tingling, tremors, sensory change, speech change, focal weakness, seizures, loss of consciousness, weakness and headaches.  Endo/Heme/Allergies:  Positive for environmental allergies. Negative for polydipsia. Bruises/bleeds easily.  Psychiatric/Behavioral: Positive for depression and memory loss. Negative for suicidal ideas, hallucinations and substance abuse. The patient is nervous/anxious. The patient does not have insomnia.      Past Medical History  Diagnosis Date  . Macular degeneration   . COPD (chronic obstructive pulmonary disease)   . Hypertension   . Coronary artery disease   . Hypercholesterolemia   . GERD (gastroesophageal reflux disease)   . Asthmatic bronchitis   . History of colonic polyps   . DJD (degenerative joint disease)   . Lumbar back pain   . Osteoporosis   . History of transient ischemic attack (TIA)   . Anxiety   . Essential thrombocythemia   . Diverticulosis 05/10/2012  . Osteoarthrosis, unspecified whether generalized or localized, unspecified site 05/09/2012  . Memory loss 05/09/2012  . Abnormal weight gain 05/09/2012  . Phlebitis and thrombophlebitis of other deep vessels of lower extremities 07/10/2010  . Essential thrombocythemia 06/09/2009   Past Surgical History  Procedure Laterality Date  . Appendectomy    . Cholecystectomy    . Tah and bso  08/21/1995  . Cataract extraction w/ intraocular lens  implant, bilateral    . Abdominal hysterectomy    . Hip arthroplasty Right 09/09/2012    Procedure: ARTHROPLASTY BIPOLAR HIP;  Surgeon: Mauri Pole, MD;  Location: [facility];  Service: Orthopedics;  Laterality: Right;  . Esophagogastroduodenoscopy (egd) with esophageal dilation N/A 11/25/2012    Procedure: ESOPHAGOGASTRODUODENOSCOPY (EGD) WITH ESOPHAGEAL DILATION;  Surgeon: Jerene Bears, MD;  Location: [facility];  Service: Gastroenterology;  Laterality: N/A;   Social History:   reports that she has never smoked. She has never used smokeless tobacco. She reports that she does not drink alcohol or use illicit drugs.  Family History  Problem Relation Age of Onset  . Colon cancer Son 46  . Crohn's  disease Son   . Cancer Maternal Aunt     Medications: Patient's Medications  New Prescriptions   No medications on file  Previous Medications   ALBUTEROL (PROAIR HFA) 108 (90 BASE) MCG/ACT INHALER    Inhale 2 puffs into the lungs every 6 (six) hours as needed for wheezing. As needed for wheezing   ATORVASTATIN (LIPITOR) 20 MG TABLET    Take 20 mg by mouth every evening.    CHOLECALCIFEROL (VITAMIN D) 1000 UNITS TABLET    Take 1,000 Units by mouth every morning.   FEEDING SUPPLEMENT (RESOURCE BREEZE) LIQD    Take 1 Container by mouth 4 (four) times daily.   FLUTICASONE-SALMETEROL (ADVAIR) 250-50 MCG/DOSE AEPB    Inhale 1 puff into the lungs every 12 (twelve) hours.   HYDROCODONE-ACETAMINOPHEN (NORCO) 10-325 MG PER TABLET    Take 1 tablet by mouth 4 (four) times daily -  before meals and at bedtime.    HYDROXYUREA (HYDREA) 500 MG CAPSULE    Take 500 mg by mouth See admin instructions. Take 2 capsules on Monday. Take 1 capsule every other day of the week (Wed, Fri, Sun)   MEMANTINE (NAMENDA) 5 MG TABLET    Take 1 tablet (5 mg total) by mouth 2 (two) times daily.   METOPROLOL TARTRATE (LOPRESSOR) 25 MG TABLET    Take 25 mg by mouth 2 (two) times daily.   MONTELUKAST (SINGULAIR) 10 MG TABLET    Take 10 mg by mouth at bedtime.   MULTIPLE VITAMINS-MINERALS (CERTAVITE/ANTIOXIDANTS PO)    Take 1 capsule by mouth every morning.   NITROGLYCERIN (NITROSTAT) 0.4 MG SL TABLET    Place 0.4 mg under the tongue every 5 (five) minutes as needed for chest pain.    OMEPRAZOLE (PRILOSEC) 20 MG CAPSULE    Take 20 mg by mouth every morning.    RIVAROXABAN (XARELTO) 15 MG TABS TABLET    Take 15 mg by mouth daily.   SERTRALINE (ZOLOFT) 25 MG TABLET    Take 25 mg by mouth every evening.  Modified Medications   No medications on file  Discontinued Medications   No medications on file     Physical Exam: Physical Exam  Constitutional: She is oriented to person, place, and time. She appears well-developed and  well-nourished. No distress.  HENT:  Head: Normocephalic and atraumatic.  Eyes: Conjunctivae and EOM are normal. Pupils are equal, round, and reactive to light.  Neck: Normal range of motion. Neck supple. No JVD present. No thyromegaly present.  Cardiovascular: Normal rate, regular rhythm and normal heart sounds.   Pulmonary/Chest: Effort normal. She has decreased breath sounds. She has no wheezes. She has no rales.  Decreased breath sounds Bilat. Lungs.   Abdominal: Soft. Bowel sounds are normal. There is no tenderness.  Musculoskeletal: Normal range of motion. She exhibits no edema. Tenderness: chronic lower back and the right hip since the fx. better controloled with scheduled Norco.   Lymphadenopathy:    She has no cervical adenopathy.  Neurological: She is alert and oriented to person, place, and time. She has normal reflexes. No cranial nerve deficit. She exhibits normal muscle tone. Coordination normal.  Skin: Skin is warm and  dry. No rash noted. She is not diaphoretic. No erythema (LLE).  The right hip surgical incision healed. Itching/scaly mid shins  Psychiatric: Her mood appears not anxious. Her affect is not angry, not blunt, not labile and not inappropriate. Her speech is not delayed, not tangential and not slurred. She is slowed. She is not agitated, not aggressive, not withdrawn and not combative. Thought content is not paranoid and not delusional. Cognition and memory are impaired. She does not express impulsivity or inappropriate judgment. She exhibits a depressed mood. She exhibits abnormal recent memory.    Filed Vitals:   03/12/13 1301  BP: 106/60  Pulse: 74  Temp: 97.6 F (36.4 C)  TempSrc: Tympanic  Resp: 20      Labs reviewed: Basic Metabolic Panel:  Recent Labs  07/16/12 1417  09/11/12 0455  10/18/12 11/19/12 1128 11/22/12 1800 11/22/12 2249 11/23/12 0435  NA 140  < > 136  < >  --  142 141  --  143  K 4.4  < > 4.2  < >  --  4.7 3.5  --  3.6  CL 106   < > 105  --   --   --  101  --  106  CO2 26  < > 26  --   --  26 24  --  22  GLUCOSE 128*  < > 132*  --   --  127 83  --  69*  BUN 23.0  < > 12  < >  --  17.6 17  --  14  CREATININE 1.1  < > 0.74  < >  --  0.9 0.74  --  0.75  CALCIUM 9.0  < > 8.4  --   --  10.1 9.9  --  8.5  TSH  --   --   --   --  2.81  --   --  0.129*  --   < > = values in this interval not displayed. Liver Function Tests:  Recent Labs  11/19/12 1128 11/22/12 1800 11/23/12 0435  AST 21 23 22   ALT 14 12 11   ALKPHOS 77 81 73  BILITOT 0.36 0.5 0.5  PROT 7.0 7.1 6.1  ALBUMIN 3.4* 3.7 3.0*   CBC:  Recent Labs  11/19/12 1128 11/22/12 1800 11/23/12 0435  11/24/12 0430 11/25/12 0505 11/26/12 0526 12/05/12  WBC 7.0 8.2 8.4  < > 10.3 7.5 7.1 6.2  NEUTROABS 5.8 6.6 6.9  --   --   --   --   --   HGB 12.7 13.6 12.4  < > 11.7* 11.5* 11.2* 11.9*  HCT 38.4 41.8 39.1  < > 36.2 35.9* 35.0* 35*  MCV 107.1* 108.0* 110.1*  < > 108.1* 107.2* 106.7*  --   PLT 386 347 316  < > 280 269 291 413*  < > = values in this interval not displayed.  Past Procedures:  09/09/12  CT head:  IMPRESSION: No skull fracture or intracranial hemorrhage.  Small vessel disease type changes without CT evidence of large acute infarct.  X-ray right hip  IMPRESSION: Right femoral prosthesis without acute complication  X-ray Lumbar spine  IMPRESSION: Diffuse lumbar spondylosis.  No evidence of fracture.   X--ray R hip  IMPRESSION: Displaced right subcapital hip fracture.  11/25/12 GED: Esophageal dysmotility w/o definitive stricture. S/p empiric dilation in the lower third of the esophagus and across GE junction with TTS balloon to 65mm. Recommended diet as tolerated.    Assessment/Plan No  problem-specific assessment & plan notes found for this encounter.   Family/ Staff Communication: observe the patient  Goals of Care: failed AL and returned to SNF for care.   Labs/tests ordered: none.

## 2013-03-12 NOTE — Assessment & Plan Note (Signed)
Hx of lower back pain, had X-ray of her lumbar spine 09/08/12--spondylosis-better controlled with Hydrocodone/ApAp  10/325mg ac and hs  01/01/13 lumbar spine inj    

## 2013-04-16 ENCOUNTER — Encounter: Payer: Self-pay | Admitting: Nurse Practitioner

## 2013-04-16 ENCOUNTER — Non-Acute Institutional Stay (SKILLED_NURSING_FACILITY): Payer: Medicare Other | Admitting: Nurse Practitioner

## 2013-04-16 DIAGNOSIS — F32A Depression, unspecified: Secondary | ICD-10-CM

## 2013-04-16 DIAGNOSIS — M545 Low back pain, unspecified: Secondary | ICD-10-CM

## 2013-04-16 DIAGNOSIS — F329 Major depressive disorder, single episode, unspecified: Secondary | ICD-10-CM

## 2013-04-16 DIAGNOSIS — F0390 Unspecified dementia without behavioral disturbance: Secondary | ICD-10-CM

## 2013-04-16 DIAGNOSIS — F341 Dysthymic disorder: Secondary | ICD-10-CM

## 2013-04-16 DIAGNOSIS — J449 Chronic obstructive pulmonary disease, unspecified: Secondary | ICD-10-CM

## 2013-04-16 DIAGNOSIS — D473 Essential (hemorrhagic) thrombocythemia: Secondary | ICD-10-CM

## 2013-04-16 DIAGNOSIS — K228 Other specified diseases of esophagus: Secondary | ICD-10-CM

## 2013-04-16 DIAGNOSIS — I10 Essential (primary) hypertension: Secondary | ICD-10-CM

## 2013-04-16 DIAGNOSIS — F419 Anxiety disorder, unspecified: Secondary | ICD-10-CM

## 2013-04-16 DIAGNOSIS — K2289 Other specified disease of esophagus: Secondary | ICD-10-CM

## 2013-04-16 NOTE — Assessment & Plan Note (Signed)
Hx of lower back pain, had X-ray of her lumbar spine 09/08/12--spondylosis-better controlled with Hydrocodone/ApAp  10/325mg ac and hs  01/01/13 lumbar spine inj    

## 2013-04-16 NOTE — Assessment & Plan Note (Signed)
Stable on Omeprazole 20 mg daily.

## 2013-04-16 NOTE — Assessment & Plan Note (Signed)
Treated by Hematology, takes Hydrea 1000mg Monday, 500mg Wed, Fri, Sun. Plt 413 12/05/12. Chronic anticoagulation with Xarelto.              

## 2013-04-16 NOTE — Assessment & Plan Note (Signed)
Stable on Sertraline 25mg  daily.

## 2013-04-16 NOTE — Assessment & Plan Note (Signed)
Controlled on Metoprolol 25mg bid.    

## 2013-04-16 NOTE — Progress Notes (Signed)
Patient ID: Sherri Crawford, female   DOB: [birth year], 80 y.o.   MRN: [id number]    Patient ID: Sherri Crawford, female   DOB: 1921/01/11, 80 y.o.   MRN: [id number]  Code Status: Full Code.   Allergies  Allergen Reactions  . Sulfonamide Derivatives Other (See Comments)    Pt is unsure of reaction    Chief Complaint  Patient presents with  . Medical Managment of Chronic Issues    HPI: Patient is a 80 y.o. female seen in the SNF at [facility] today for evaluation of herchronic medical conditions.  Problem List Items Addressed This Visit   Presbyesophagus     Stable on Omeprazole 20mg  daily.      Essential thrombocythemia - Primary     Treated by Hematology, takes Hydrea 1000mg  Monday, 500mg  Wed, Fri, Sun. Plt 413 12/05/12. Chronic anticoagulation with Xarelto.      Essential hypertension     Controlled on Metoprolol 25mg  bid.      Dementia     Presently Namenda daily and SNF for care needs    COPD with asthma     Managed with Fluticasone-Salmeterol 250/50 bid, dc'd Epipen prn and Immunotherapy per [facility] 01/17/13, Montelukast 10mg  daily, and Albuterol II puffs q6hr prn. Stable.       BACK PAIN, LUMBAR     Hx of lower back pain, had X-ray of her lumbar spine 09/08/12--spondylosis-better controlled with Hydrocodone/ApAp  10/325mg  ac and hs  01/01/13 lumbar spine inj      Anxiety and depression     Stable on Sertraline 25mg  daily.          Review of Systems:  Review of Systems  Constitutional: Negative for fever, chills, weight loss, malaise/fatigue and diaphoresis.  HENT: Positive for hearing loss. Negative for congestion, ear discharge, ear pain, nosebleeds, sore throat and tinnitus.   Eyes: Negative for blurred vision, double vision, photophobia, pain, discharge and redness.  Respiratory: Positive for cough. Negative for hemoptysis, sputum production, shortness of breath, wheezing (occasional. ) and stridor.   Cardiovascular: Negative for chest  pain, palpitations, orthopnea, claudication and PND. Leg swelling: trace RLE-resolved.   Gastrointestinal: Negative for heartburn, nausea, vomiting, abdominal pain, diarrhea, constipation, blood in stool and melena.       Dysphagia.   Genitourinary: Positive for frequency. Negative for dysuria, urgency, hematuria and flank pain.  Musculoskeletal: Positive for joint pain (right hip). Negative for falls (recent and resulted in the right hip fx. ), myalgias and neck pain. Back pain: chronic back pain is managed with Norco.       Ambulates with walker.   Skin: Negative for itching and rash.       Itching scaly mid of R+L shins.   Neurological: Negative for dizziness, tingling, tremors, sensory change, speech change, focal weakness, seizures, loss of consciousness, weakness and headaches.  Endo/Heme/Allergies: Positive for environmental allergies. Negative for polydipsia. Bruises/bleeds easily.  Psychiatric/Behavioral: Positive for depression and memory loss. Negative for suicidal ideas, hallucinations and substance abuse. The patient is nervous/anxious. The patient does not have insomnia.      Past Medical History  Diagnosis Date  . Macular degeneration   . COPD (chronic obstructive pulmonary disease)   . Hypertension   . Coronary artery disease   . Hypercholesterolemia   . GERD (gastroesophageal reflux disease)   . Asthmatic bronchitis   . History of colonic polyps   . DJD (degenerative joint disease)   . Lumbar back pain   . Osteoporosis   .  History of transient ischemic attack (TIA)   . Anxiety   . Essential thrombocythemia   . Diverticulosis 05/10/2012  . Osteoarthrosis, unspecified whether generalized or localized, unspecified site 05/09/2012  . Memory loss 05/09/2012  . Abnormal weight gain 05/09/2012  . Phlebitis and thrombophlebitis of other deep vessels of lower extremities 07/10/2010  . Essential thrombocythemia 06/09/2009   Past Surgical History  Procedure Laterality Date  .  Appendectomy    . Cholecystectomy    . Tah and bso  08/21/1995  . Cataract extraction w/ intraocular lens  implant, bilateral    . Abdominal hysterectomy    . Hip arthroplasty Right 09/09/2012    Procedure: ARTHROPLASTY BIPOLAR HIP;  Surgeon: Mauri Pole, MD;  Location: [facility];  Service: Orthopedics;  Laterality: Right;  . Esophagogastroduodenoscopy (egd) with esophageal dilation N/A 11/25/2012    Procedure: ESOPHAGOGASTRODUODENOSCOPY (EGD) WITH ESOPHAGEAL DILATION;  Surgeon: Jerene Bears, MD;  Location: [facility];  Service: Gastroenterology;  Laterality: N/A;   Social History:   reports that she has never smoked. She has never used smokeless tobacco. She reports that she does not drink alcohol or use illicit drugs.  Family History  Problem Relation Age of Onset  . Colon cancer Son 2  . Crohn's disease Son   . Cancer Maternal Aunt     Medications: Patient's Medications  New Prescriptions   No medications on file  Previous Medications   ALBUTEROL (PROAIR HFA) 108 (90 BASE) MCG/ACT INHALER    Inhale 2 puffs into the lungs every 6 (six) hours as needed for wheezing. As needed for wheezing   ATORVASTATIN (LIPITOR) 20 MG TABLET    Take 20 mg by mouth every evening.    CHOLECALCIFEROL (VITAMIN D) 1000 UNITS TABLET    Take 1,000 Units by mouth every morning.   FEEDING SUPPLEMENT (RESOURCE BREEZE) LIQD    Take 1 Container by mouth 4 (four) times daily.   FLUTICASONE-SALMETEROL (ADVAIR) 250-50 MCG/DOSE AEPB    Inhale 1 puff into the lungs every 12 (twelve) hours.   HYDROCODONE-ACETAMINOPHEN (NORCO) 10-325 MG PER TABLET    Take 1 tablet by mouth 4 (four) times daily -  before meals and at bedtime.    HYDROXYUREA (HYDREA) 500 MG CAPSULE    Take 500 mg by mouth See admin instructions. Take 2 capsules on Monday. Take 1 capsule every other day of the week (Wed, Fri, Sun)   MEMANTINE (NAMENDA) 5 MG TABLET    Take 1 tablet (5 mg total) by mouth 2 (two) times daily.   METOPROLOL TARTRATE  (LOPRESSOR) 25 MG TABLET    Take 25 mg by mouth 2 (two) times daily.   MONTELUKAST (SINGULAIR) 10 MG TABLET    Take 10 mg by mouth at bedtime.   MULTIPLE VITAMINS-MINERALS (CERTAVITE/ANTIOXIDANTS PO)    Take 1 capsule by mouth every morning.   NITROGLYCERIN (NITROSTAT) 0.4 MG SL TABLET    Place 0.4 mg under the tongue every 5 (five) minutes as needed for chest pain.    OMEPRAZOLE (PRILOSEC) 20 MG CAPSULE    Take 20 mg by mouth every morning.    RIVAROXABAN (XARELTO) 15 MG TABS TABLET    Take 15 mg by mouth daily.   SERTRALINE (ZOLOFT) 25 MG TABLET    Take 25 mg by mouth every evening.  Modified Medications   No medications on file  Discontinued Medications   No medications on file     Physical Exam: Physical Exam  Constitutional: She is oriented to person,  place, and time. She appears well-developed and well-nourished. No distress.  HENT:  Head: Normocephalic and atraumatic.  Eyes: Conjunctivae and EOM are normal. Pupils are equal, round, and reactive to light.  Neck: Normal range of motion. Neck supple. No JVD present. No thyromegaly present.  Cardiovascular: Normal rate, regular rhythm and normal heart sounds.   Pulmonary/Chest: Effort normal. She has decreased breath sounds. She has no wheezes. She has no rales.  Decreased breath sounds Bilat. Lungs.   Abdominal: Soft. Bowel sounds are normal. There is no tenderness.  Musculoskeletal: Normal range of motion. She exhibits no edema. Tenderness: chronic lower back and the right hip since the fx. better controloled with scheduled Norco.   Lymphadenopathy:    She has no cervical adenopathy.  Neurological: She is alert and oriented to person, place, and time. She has normal reflexes. No cranial nerve deficit. She exhibits normal muscle tone. Coordination normal.  Skin: Skin is warm and dry. No rash noted. She is not diaphoretic. No erythema (LLE).  The right hip surgical incision healed. Itching/scaly mid shins  Psychiatric: Her mood  appears not anxious. Her affect is not angry, not blunt, not labile and not inappropriate. Her speech is not delayed, not tangential and not slurred. She is slowed. She is not agitated, not aggressive, not withdrawn and not combative. Thought content is not paranoid and not delusional. Cognition and memory are impaired. She does not express impulsivity or inappropriate judgment. She exhibits a depressed mood. She exhibits abnormal recent memory.    Filed Vitals:   04/16/13 1301  BP: 130/80  Pulse: 76  Temp: 98.2 F (36.8 C)  TempSrc: Tympanic  Resp: 22      Labs reviewed: Basic Metabolic Panel:  Recent Labs  07/16/12 1417  09/11/12 0455  10/18/12 11/19/12 1128 11/22/12 1800 11/22/12 2249 11/23/12 0435  NA 140  < > 136  < >  --  142 141  --  143  K 4.4  < > 4.2  < >  --  4.7 3.5  --  3.6  CL 106  < > 105  --   --   --  101  --  106  CO2 26  < > 26  --   --  26 24  --  22  GLUCOSE 128*  < > 132*  --   --  127 83  --  69*  BUN 23.0  < > 12  < >  --  17.6 17  --  14  CREATININE 1.1  < > 0.74  < >  --  0.9 0.74  --  0.75  CALCIUM 9.0  < > 8.4  --   --  10.1 9.9  --  8.5  TSH  --   --   --   --  2.81  --   --  0.129*  --   < > = values in this interval not displayed. Liver Function Tests:  Recent Labs  11/19/12 1128 11/22/12 1800 11/23/12 0435  AST 21 23 22   ALT 14 12 11   ALKPHOS 77 81 73  BILITOT 0.36 0.5 0.5  PROT 7.0 7.1 6.1  ALBUMIN 3.4* 3.7 3.0*   CBC:  Recent Labs  11/19/12 1128 11/22/12 1800 11/23/12 0435  11/24/12 0430 11/25/12 0505 11/26/12 0526 12/05/12  WBC 7.0 8.2 8.4  < > 10.3 7.5 7.1 6.2  NEUTROABS 5.8 6.6 6.9  --   --   --   --   --  HGB 12.7 13.6 12.4  < > 11.7* 11.5* 11.2* 11.9*  HCT 38.4 41.8 39.1  < > 36.2 35.9* 35.0* 35*  MCV 107.1* 108.0* 110.1*  < > 108.1* 107.2* 106.7*  --   PLT 386 347 316  < > 280 269 291 413*  < > = values in this interval not displayed.  Past Procedures:  09/09/12  CT head:  IMPRESSION: No skull fracture  or intracranial hemorrhage.  Small vessel disease type changes without CT evidence of large acute infarct.  X-ray right hip  IMPRESSION: Right femoral prosthesis without acute complication  X-ray Lumbar spine  IMPRESSION: Diffuse lumbar spondylosis.  No evidence of fracture.   X--ray R hip  IMPRESSION: Displaced right subcapital hip fracture.  11/25/12 GED: Esophageal dysmotility w/o definitive stricture. S/p empiric dilation in the lower third of the esophagus and across GE junction with TTS balloon to 68mm. Recommended diet as tolerated.    Assessment/Plan Essential thrombocythemia Treated by Hematology, takes Hydrea 1000mg  Monday, 500mg  Wed, Fri, Sun. Plt 413 12/05/12. Chronic anticoagulation with Xarelto.    Dementia Presently [facility] daily and SNF for care needs  COPD with asthma Managed with Fluticasone-Salmeterol 250/50 bid, dc'd Epipen prn and Immunotherapy per [facility] 01/17/13, Montelukast 10mg  daily, and Albuterol II puffs q6hr prn. Stable.     Essential hypertension Controlled on Metoprolol 25mg  bid.    BACK PAIN, LUMBAR Hx of lower back pain, had X-ray of her lumbar spine 09/08/12--spondylosis-better controlled with Hydrocodone/ApAp  10/325mg  ac and hs  01/01/13 lumbar spine inj    Anxiety and depression Stable on Sertraline 25mg  daily.     Presbyesophagus Stable on Omeprazole 20mg  daily.      Family/ Staff Communication: observe the patient  Goals of Care: failed AL and returned to SNF for care.   Labs/tests ordered: none.

## 2013-04-16 NOTE — Assessment & Plan Note (Signed)
Managed with Fluticasone-Salmeterol 250/50 bid, dc'd Epipen prn and Immunotherapy per [facility] 01/17/13, Montelukast 10mg daily, and Albuterol II puffs q6hr prn. Stable 

## 2013-04-16 NOTE — Assessment & Plan Note (Signed)
Presently Namenda daily and SNF for care needs.                  

## 2013-04-26 ENCOUNTER — Encounter: Payer: Self-pay | Admitting: Oncology

## 2013-04-28 ENCOUNTER — Other Ambulatory Visit: Payer: Self-pay | Admitting: *Deleted

## 2013-04-28 DIAGNOSIS — D473 Essential (hemorrhagic) thrombocythemia: Secondary | ICD-10-CM

## 2013-04-28 MED ORDER — HYDROCODONE-ACETAMINOPHEN 10-325 MG PO TABS: ORAL_TABLET | ORAL | Status: DC

## 2013-04-28 NOTE — Telephone Encounter (Signed)
[facility] 

## 2013-05-14 ENCOUNTER — Encounter: Payer: Self-pay | Admitting: Nurse Practitioner

## 2013-05-14 ENCOUNTER — Non-Acute Institutional Stay (SKILLED_NURSING_FACILITY): Payer: Medicare Other | Admitting: Nurse Practitioner

## 2013-05-14 DIAGNOSIS — K219 Gastro-esophageal reflux disease without esophagitis: Secondary | ICD-10-CM

## 2013-05-14 DIAGNOSIS — F329 Major depressive disorder, single episode, unspecified: Secondary | ICD-10-CM

## 2013-05-14 DIAGNOSIS — I10 Essential (primary) hypertension: Secondary | ICD-10-CM

## 2013-05-14 DIAGNOSIS — F32A Depression, unspecified: Secondary | ICD-10-CM

## 2013-05-14 DIAGNOSIS — F0390 Unspecified dementia without behavioral disturbance: Secondary | ICD-10-CM

## 2013-05-14 DIAGNOSIS — M545 Low back pain, unspecified: Secondary | ICD-10-CM

## 2013-05-14 DIAGNOSIS — J449 Chronic obstructive pulmonary disease, unspecified: Secondary | ICD-10-CM

## 2013-05-14 DIAGNOSIS — F419 Anxiety disorder, unspecified: Secondary | ICD-10-CM

## 2013-05-14 DIAGNOSIS — F341 Dysthymic disorder: Secondary | ICD-10-CM

## 2013-05-14 DIAGNOSIS — D473 Essential (hemorrhagic) thrombocythemia: Secondary | ICD-10-CM

## 2013-05-14 NOTE — Assessment & Plan Note (Signed)
Presently Namenda daily and SNF for care needs.                  

## 2013-05-14 NOTE — Assessment & Plan Note (Addendum)
Treated by Hematology, takes Hydrea 1000mg  Monday, 500mg  Wed, Fri, Sun. Plt 413 12/05/12. Chronic anticoagulation with Xarelto. Check CBC and uric acid, CMP

## 2013-05-14 NOTE — Assessment & Plan Note (Signed)
Stable on Sertraline 25mg daily.    

## 2013-05-14 NOTE — Assessment & Plan Note (Signed)
Managed with Fluticasone-Salmeterol 250/50 bid, dc'd Epipen prn and Immunotherapy per [facility] 01/17/13, Montelukast 10mg daily, and Albuterol II puffs q6hr prn. Stable 

## 2013-05-14 NOTE — Assessment & Plan Note (Signed)
Stable on Omeprazole 20 mg daily.

## 2013-05-14 NOTE — Assessment & Plan Note (Signed)
Hx of lower back pain, had X-ray of her lumbar spine 09/08/12--spondylosis-better controlled with Hydrocodone/ApAp  10/325mg  ac and hs  01/01/13 lumbar spine inj

## 2013-05-14 NOTE — Assessment & Plan Note (Signed)
Controlled on Metoprolol 25mg bid.    

## 2013-05-14 NOTE — Progress Notes (Signed)
Patient ID: Sherri Crawford, female   DOB: 07/02/1920, 80 y.o.   MRN: [id number]    Patient ID: Sherri Crawford, female   DOB: Jun 28, 1920, 80 y.o.   MRN: [id number]  Code Status: Full Code.   Allergies  Allergen Reactions  . Sulfonamide Derivatives Other (See Comments)    Pt is unsure of reaction    Chief Complaint  Patient presents with  . Medical Managment of Chronic Issues    HPI: Patient is a 80 y.o. female seen in the SNF at [facility] today for evaluation of herchronic medical conditions.  Problem List Items Addressed This Visit   Essential thrombocythemia - Primary     Treated by Hematology, takes Hydrea 1000mg  Monday, 500mg  Wed, Fri, Sun. Plt 413 12/05/12. Chronic anticoagulation with Xarelto. Check CBC and uric acid, CMP       Essential hypertension     Controlled on Metoprolol 25mg  bid.       COPD with asthma     Managed with Fluticasone-Salmeterol 250/50 bid, dc'd Epipen prn and Immunotherapy per [facility] 01/17/13, Montelukast 10mg  daily, and Albuterol II puffs q6hr prn. Stable.       GERD     Stable on Omeprazole 20mg  daily.      BACK PAIN, LUMBAR     Hx of lower back pain, had X-ray of her lumbar spine 09/08/12--spondylosis-better controlled with Hydrocodone/ApAp  10/325mg  ac and hs  01/01/13 lumbar spine inj       Anxiety and depression     Stable on Sertraline 25mg  daily.      Dementia     Presently Namenda daily and SNF for care needs        Review of Systems:  Review of Systems  Constitutional: Negative for fever, chills, weight loss, malaise/fatigue and diaphoresis.  HENT: Positive for hearing loss. Negative for congestion, ear discharge, ear pain, nosebleeds, sore throat and tinnitus.   Eyes: Negative for blurred vision, double vision, photophobia, pain, discharge and redness.  Respiratory: Positive for cough. Negative for hemoptysis, sputum production, shortness of breath, wheezing (occasional. ) and stridor.     Cardiovascular: Negative for chest pain, palpitations, orthopnea, claudication and PND. Leg swelling: trace RLE-resolved.   Gastrointestinal: Negative for heartburn, nausea, vomiting, abdominal pain, diarrhea, constipation, blood in stool and melena.       Dysphagia.   Genitourinary: Positive for frequency. Negative for dysuria, urgency, hematuria and flank pain.  Musculoskeletal: Positive for joint pain (right hip). Negative for falls (recent and resulted in the right hip fx. ), myalgias and neck pain. Back pain: chronic back pain is managed with Norco.       Ambulates with walker.   Skin: Negative for itching and rash.       Itching scaly mid of R+L shins.   Neurological: Negative for dizziness, tingling, tremors, sensory change, speech change, focal weakness, seizures, loss of consciousness, weakness and headaches.  Endo/Heme/Allergies: Positive for environmental allergies. Negative for polydipsia. Bruises/bleeds easily.  Psychiatric/Behavioral: Positive for depression and memory loss. Negative for suicidal ideas, hallucinations and substance abuse. The patient is nervous/anxious. The patient does not have insomnia.      Past Medical History  Diagnosis Date  . Macular degeneration   . COPD (chronic obstructive pulmonary disease)   . Hypertension   . Coronary artery disease   . Hypercholesterolemia   . GERD (gastroesophageal reflux disease)   . Asthmatic bronchitis   . History of colonic polyps   . DJD (degenerative joint disease)   .  Lumbar back pain   . Osteoporosis   . History of transient ischemic attack (TIA)   . Anxiety   . Essential thrombocythemia   . Diverticulosis 05/10/2012  . Osteoarthrosis, unspecified whether generalized or localized, unspecified site 05/09/2012  . Memory loss 05/09/2012  . Abnormal weight gain 05/09/2012  . Phlebitis and thrombophlebitis of other deep vessels of lower extremities 07/10/2010  . Essential thrombocythemia 06/09/2009   Past Surgical  History  Procedure Laterality Date  . Appendectomy    . Cholecystectomy    . Tah and bso  08/21/1995  . Cataract extraction w/ intraocular lens  implant, bilateral    . Abdominal hysterectomy    . Hip arthroplasty Right 09/09/2012    Procedure: ARTHROPLASTY BIPOLAR HIP;  Surgeon: Mauri Pole, MD;  Location: [facility];  Service: Orthopedics;  Laterality: Right;  . Esophagogastroduodenoscopy (egd) with esophageal dilation N/A 11/25/2012    Procedure: ESOPHAGOGASTRODUODENOSCOPY (EGD) WITH ESOPHAGEAL DILATION;  Surgeon: Jerene Bears, MD;  Location: [facility];  Service: Gastroenterology;  Laterality: N/A;   Social History:   reports that she has never smoked. She has never used smokeless tobacco. She reports that she does not drink alcohol or use illicit drugs.  Family History  Problem Relation Age of Onset  . Colon cancer Son 37  . Crohn's disease Son   . Cancer Maternal Aunt     Medications: Patient's Medications  New Prescriptions   No medications on file  Previous Medications   ALBUTEROL (PROAIR HFA) 108 (90 BASE) MCG/ACT INHALER    Inhale 2 puffs into the lungs every 6 (six) hours as needed for wheezing. As needed for wheezing   ATORVASTATIN (LIPITOR) 20 MG TABLET    Take 20 mg by mouth every evening.    CHOLECALCIFEROL (VITAMIN D) 1000 UNITS TABLET    Take 1,000 Units by mouth every morning.   FEEDING SUPPLEMENT (RESOURCE BREEZE) LIQD    Take 1 Container by mouth 4 (four) times daily.   FLUTICASONE-SALMETEROL (ADVAIR) 250-50 MCG/DOSE AEPB    Inhale 1 puff into the lungs every 12 (twelve) hours.   HYDROCODONE-ACETAMINOPHEN (NORCO) 10-325 MG PER TABLET    Take one tablet by mouth four times daily for pain   HYDROXYUREA (HYDREA) 500 MG CAPSULE    Take 500 mg by mouth See admin instructions. Take 2 capsules on Monday. Take 1 capsule every other day of the week (Wed, Fri, Sun)   MEMANTINE (NAMENDA) 5 MG TABLET    Take 1 tablet (5 mg total) by mouth 2 (two) times daily.   METOPROLOL  TARTRATE (LOPRESSOR) 25 MG TABLET    Take 25 mg by mouth 2 (two) times daily.   MONTELUKAST (SINGULAIR) 10 MG TABLET    Take 10 mg by mouth at bedtime.   MULTIPLE VITAMINS-MINERALS (CERTAVITE/ANTIOXIDANTS PO)    Take 1 capsule by mouth every morning.   NITROGLYCERIN (NITROSTAT) 0.4 MG SL TABLET    Place 0.4 mg under the tongue every 5 (five) minutes as needed for chest pain.    OMEPRAZOLE (PRILOSEC) 20 MG CAPSULE    Take 20 mg by mouth every morning.    RIVAROXABAN (XARELTO) 15 MG TABS TABLET    Take 15 mg by mouth daily.   SERTRALINE (ZOLOFT) 25 MG TABLET    Take 25 mg by mouth every evening.  Modified Medications   No medications on file  Discontinued Medications   No medications on file     Physical Exam: Physical Exam  Constitutional: She is  oriented to person, place, and time. She appears well-developed and well-nourished. No distress.  HENT:  Head: Normocephalic and atraumatic.  Eyes: Conjunctivae and EOM are normal. Pupils are equal, round, and reactive to light.  Neck: Normal range of motion. Neck supple. No JVD present. No thyromegaly present.  Cardiovascular: Normal rate, regular rhythm and normal heart sounds.   Pulmonary/Chest: Effort normal. She has decreased breath sounds. She has no wheezes. She has no rales.  Decreased breath sounds Bilat. Lungs.   Abdominal: Soft. Bowel sounds are normal. There is no tenderness.  Musculoskeletal: Normal range of motion. She exhibits no edema. Tenderness: chronic lower back and the right hip since the fx. better controloled with scheduled Norco.   Lymphadenopathy:    She has no cervical adenopathy.  Neurological: She is alert and oriented to person, place, and time. She has normal reflexes. No cranial nerve deficit. She exhibits normal muscle tone. Coordination normal.  Skin: Skin is warm and dry. No rash noted. She is not diaphoretic. No erythema (LLE).  The right hip surgical incision healed. Itching/scaly mid shins  Psychiatric: Her  mood appears not anxious. Her affect is not angry, not blunt, not labile and not inappropriate. Her speech is not delayed, not tangential and not slurred. She is slowed. She is not agitated, not aggressive, not withdrawn and not combative. Thought content is not paranoid and not delusional. Cognition and memory are impaired. She does not express impulsivity or inappropriate judgment. She exhibits a depressed mood. She exhibits abnormal recent memory.    Filed Vitals:   05/14/13 1722  BP: 166/88  Pulse: 67  Temp: 97 F (36.1 C)  TempSrc: Tympanic  Resp: 14      Labs reviewed: Basic Metabolic Panel:  Recent Labs  07/16/12 1417  09/11/12 0455  10/18/12 11/19/12 1128 11/22/12 1800 11/22/12 2249 11/23/12 0435  NA 140  < > 136  < >  --  142 141  --  143  K 4.4  < > 4.2  < >  --  4.7 3.5  --  3.6  CL 106  < > 105  --   --   --  101  --  106  CO2 26  < > 26  --   --  26 24  --  22  GLUCOSE 128*  < > 132*  --   --  127 83  --  69*  BUN 23.0  < > 12  < >  --  17.6 17  --  14  CREATININE 1.1  < > 0.74  < >  --  0.9 0.74  --  0.75  CALCIUM 9.0  < > 8.4  --   --  10.1 9.9  --  8.5  TSH  --   --   --   --  2.81  --   --  0.129*  --   < > = values in this interval not displayed. Liver Function Tests:  Recent Labs  11/19/12 1128 11/22/12 1800 11/23/12 0435  AST 21 23 22   ALT 14 12 11   ALKPHOS 77 81 73  BILITOT 0.36 0.5 0.5  PROT 7.0 7.1 6.1  ALBUMIN 3.4* 3.7 3.0*   CBC:  Recent Labs  11/19/12 1128 11/22/12 1800 11/23/12 0435  11/24/12 0430 11/25/12 0505 11/26/12 0526 12/05/12  WBC 7.0 8.2 8.4  < > 10.3 7.5 7.1 6.2  NEUTROABS 5.8 6.6 6.9  --   --   --   --   --  HGB 12.7 13.6 12.4  < > 11.7* 11.5* 11.2* 11.9*  HCT 38.4 41.8 39.1  < > 36.2 35.9* 35.0* 35*  MCV 107.1* 108.0* 110.1*  < > 108.1* 107.2* 106.7*  --   PLT 386 347 316  < > 280 269 291 413*  < > = values in this interval not displayed.  Past Procedures:  09/09/12  CT head:  IMPRESSION: No skull  fracture or intracranial hemorrhage.  Small vessel disease type changes without CT evidence of large acute infarct.  X-ray right hip  IMPRESSION: Right femoral prosthesis without acute complication  X-ray Lumbar spine  IMPRESSION: Diffuse lumbar spondylosis.  No evidence of fracture.   X--ray R hip  IMPRESSION: Displaced right subcapital hip fracture.  11/25/12 GED: Esophageal dysmotility w/o definitive stricture. S/p empiric dilation in the lower third of the esophagus and across GE junction with TTS balloon to 50mm. Recommended diet as tolerated.    Assessment/Plan Essential thrombocythemia Treated by Hematology, takes Hydrea 1000mg  Monday, 500mg  Wed, Fri, Sun. Plt 413 12/05/12. Chronic anticoagulation with Xarelto. Check CBC and uric acid, CMP     Dementia Presently Namenda daily and SNF for care needs   COPD with asthma Managed with Fluticasone-Salmeterol 250/50 bid, dc'd Epipen prn and Immunotherapy per [facility] 01/17/13, Montelukast 10mg  daily, and Albuterol II puffs q6hr prn. Stable.     GERD Stable on Omeprazole 20mg  daily.    Essential hypertension Controlled on Metoprolol 25mg  bid.     BACK PAIN, LUMBAR Hx of lower back pain, had X-ray of her lumbar spine 09/08/12--spondylosis-better controlled with Hydrocodone/ApAp  10/325mg  ac and hs  01/01/13 lumbar spine inj     Anxiety and depression Stable on Sertraline 25mg  daily.      Family/ Staff Communication: observe the patient  Goals of Care: failed AL and returned to SNF for care.   Labs/tests ordered: CBC, Uric acid, CMP

## 2013-05-15 LAB — CBC AND DIFFERENTIAL
HEMATOCRIT: 36 % (ref 36–46)
HEMOGLOBIN: 12.3 g/dL (ref 12.0–16.0)
Platelets: 264 10*3/uL (ref 150–399)
WBC: 4.7 10^3/mL

## 2013-05-15 LAB — HEPATIC FUNCTION PANEL
ALK PHOS: 47 U/L (ref 25–125)
ALT: 10 U/L (ref 7–35)
AST: 16 U/L (ref 13–35)
BILIRUBIN, TOTAL: 0.4 mg/dL

## 2013-05-15 LAB — BASIC METABOLIC PANEL
BUN: 21 mg/dL (ref 4–21)
Creatinine: 0.8 mg/dL (ref 0.5–1.1)
Glucose: 84 mg/dL
Potassium: 4.5 mmol/L (ref 3.4–5.3)
Sodium: 141 mmol/L (ref 137–147)

## 2013-05-20 ENCOUNTER — Other Ambulatory Visit: Payer: Medicare Other

## 2013-05-20 ENCOUNTER — Ambulatory Visit: Payer: Medicare Other | Admitting: Nurse Practitioner

## 2013-05-20 ENCOUNTER — Telehealth: Payer: Self-pay | Admitting: Oncology

## 2013-05-20 NOTE — Telephone Encounter (Signed)
returned call to pt son Gene/He cld to cancel appt for 3/24. Resch for 3/31 for 2:45 lab and appt w/lisa @ 3:15.Son understood

## 2013-05-27 ENCOUNTER — Other Ambulatory Visit (HOSPITAL_BASED_OUTPATIENT_CLINIC_OR_DEPARTMENT_OTHER): Payer: Medicare Other

## 2013-05-27 ENCOUNTER — Telehealth: Payer: Self-pay | Admitting: Oncology

## 2013-05-27 ENCOUNTER — Ambulatory Visit (HOSPITAL_BASED_OUTPATIENT_CLINIC_OR_DEPARTMENT_OTHER): Payer: Medicare Other | Admitting: Nurse Practitioner

## 2013-05-27 ENCOUNTER — Other Ambulatory Visit (HOSPITAL_BASED_OUTPATIENT_CLINIC_OR_DEPARTMENT_OTHER): Payer: Medicare Other | Admitting: Nurse Practitioner

## 2013-05-27 VITALS — BP 117/60 | HR 74 | Temp 98.3°F | Resp 17 | Ht 61.0 in | Wt 106.8 lb

## 2013-05-27 DIAGNOSIS — M479 Spondylosis, unspecified: Secondary | ICD-10-CM

## 2013-05-27 DIAGNOSIS — D473 Essential (hemorrhagic) thrombocythemia: Secondary | ICD-10-CM

## 2013-05-27 DIAGNOSIS — I82409 Acute embolism and thrombosis of unspecified deep veins of unspecified lower extremity: Secondary | ICD-10-CM

## 2013-05-27 DIAGNOSIS — I2510 Atherosclerotic heart disease of native coronary artery without angina pectoris: Secondary | ICD-10-CM

## 2013-05-27 DIAGNOSIS — I82402 Acute embolism and thrombosis of unspecified deep veins of left lower extremity: Secondary | ICD-10-CM

## 2013-05-27 LAB — PROTIME-INR
INR: 1 — ABNORMAL LOW (ref 2.00–3.50)
Protime: 12 Seconds (ref 10.6–13.4)

## 2013-05-27 LAB — COMPREHENSIVE METABOLIC PANEL (CC13)
ALK PHOS: 63 U/L (ref 40–150)
ALT: 11 U/L (ref 0–55)
ANION GAP: 8 meq/L (ref 3–11)
AST: 19 U/L (ref 5–34)
Albumin: 3.6 g/dL (ref 3.5–5.0)
BILIRUBIN TOTAL: 0.33 mg/dL (ref 0.20–1.20)
BUN: 22.8 mg/dL (ref 7.0–26.0)
CO2: 28 mEq/L (ref 22–29)
CREATININE: 1.1 mg/dL (ref 0.6–1.1)
Calcium: 9.5 mg/dL (ref 8.4–10.4)
Chloride: 104 mEq/L (ref 98–109)
GLUCOSE: 121 mg/dL (ref 70–140)
Potassium: 4.5 mEq/L (ref 3.5–5.1)
SODIUM: 140 meq/L (ref 136–145)
TOTAL PROTEIN: 6.5 g/dL (ref 6.4–8.3)

## 2013-05-27 LAB — CBC WITH DIFFERENTIAL/PLATELET
BASO%: 0.7 % (ref 0.0–2.0)
BASOS ABS: 0 10*3/uL (ref 0.0–0.1)
EOS%: 1.7 % (ref 0.0–7.0)
Eosinophils Absolute: 0.1 10*3/uL (ref 0.0–0.5)
HCT: 37.9 % (ref 34.8–46.6)
HEMOGLOBIN: 12.6 g/dL (ref 11.6–15.9)
LYMPH#: 1.6 10*3/uL (ref 0.9–3.3)
LYMPH%: 27.5 % (ref 14.0–49.7)
MCH: 36.7 pg — AB (ref 25.1–34.0)
MCHC: 33.3 g/dL (ref 31.5–36.0)
MCV: 110.1 fL — AB (ref 79.5–101.0)
MONO#: 0.3 10*3/uL (ref 0.1–0.9)
MONO%: 5.1 % (ref 0.0–14.0)
NEUT#: 3.7 10*3/uL (ref 1.5–6.5)
NEUT%: 65 % (ref 38.4–76.8)
Platelets: 332 10*3/uL (ref 145–400)
RBC: 3.44 10*6/uL — AB (ref 3.70–5.45)
RDW: 14.1 % (ref 11.2–14.5)
WBC: 5.7 10*3/uL (ref 3.9–10.3)

## 2013-05-27 LAB — LACTATE DEHYDROGENASE (CC13): LDH: 158 U/L (ref 125–245)

## 2013-05-27 LAB — URIC ACID (CC13): URIC ACID, SERUM: 3.8 mg/dL (ref 2.6–7.4)

## 2013-05-27 NOTE — Progress Notes (Signed)
  [place] OFFICE PROGRESS NOTE   Diagnosis:  Essential thrombocythemia.   INTERVAL HISTORY:   Sherri Crawford is a 80 year old woman with essential thrombocythemia maintained on hydroxyurea. She also has a history of an unprovoked left leg DVT May 2012 felt to likely be related to the underlying myeloproliferative disorder. She is on Xarelto.  She presents today for scheduled followup.  She feels she is doing well. She denies nausea/vomiting. No mouth sores. No diarrhea. No skin rash. She denies bleeding.  Objective:  Vital signs in last 24 hours:  Blood pressure 117/60, pulse 74, temperature 98.3 F (36.8 C), temperature source Oral, resp. rate 17, height 5\' 1"  (1.549 m), weight 106 lb 12.8 oz (48.444 kg), SpO2 96.00%.    HEENT: No thrush or ulcerations. Lymphatics: No palpable cervical or supraclavicular lymph nodes. Resp: Lungs clear. Cardio: Regular cardiac rhythm. GI: Abdomen soft and nontender. No organomegaly. Vascular: No leg edema.  Skin: No skin rash.   Portacath/PICC-without erythema.  Lab Results:  Lab Results  Component Value Date   WBC 5.7 05/27/2013   HGB 12.6 05/27/2013   HCT 37.9 05/27/2013   MCV 110.1* 05/27/2013   PLT 332 05/27/2013   NEUTROABS 3.7 05/27/2013      Imaging:  No results found.  Medications: I have reviewed the patient's current medications.  Assessment/Plan: 1. Myeloproliferative disorder/essential thrombocythemia. Platelet count controlled on current dose of Hydrea. 2. Left lower extremity DVT. She is maintained on anticoagulation with Xarelto. 3. Degenerative arthritis of the spine. 4. Coronary artery disease. 5. Hyperlipidemia. 6. Essential hypertension. 7. Right femoral neck fracture after a fall July 2014.   Disposition: The platelet count is well controlled. She will continue Hydrea at the current dose. We will continue monthly labs. She will followup with Dr. Beryle Beams in 6 months at [facility].  Plan  reviewed with Dr. Benay Spice.    Sherri Crawford ANP/GNP-BC   05/27/2013  4:32 PM

## 2013-05-27 NOTE — Telephone Encounter (Signed)
PER 3/31 POF PT TO F/U W/JG @ [facility] - PT/[facility]WARE [facility] WILL CALL THEM. PT ADDED TO [facility] LIST.

## 2013-06-11 ENCOUNTER — Non-Acute Institutional Stay (SKILLED_NURSING_FACILITY): Payer: Medicare Other | Admitting: Nurse Practitioner

## 2013-06-11 ENCOUNTER — Encounter: Payer: Self-pay | Admitting: Nurse Practitioner

## 2013-06-11 DIAGNOSIS — D473 Essential (hemorrhagic) thrombocythemia: Secondary | ICD-10-CM

## 2013-06-11 DIAGNOSIS — M545 Low back pain, unspecified: Secondary | ICD-10-CM

## 2013-06-11 DIAGNOSIS — J449 Chronic obstructive pulmonary disease, unspecified: Secondary | ICD-10-CM

## 2013-06-11 DIAGNOSIS — I10 Essential (primary) hypertension: Secondary | ICD-10-CM

## 2013-06-11 DIAGNOSIS — I82402 Acute embolism and thrombosis of unspecified deep veins of left lower extremity: Secondary | ICD-10-CM

## 2013-06-11 DIAGNOSIS — K219 Gastro-esophageal reflux disease without esophagitis: Secondary | ICD-10-CM

## 2013-06-11 DIAGNOSIS — F32A Depression, unspecified: Secondary | ICD-10-CM

## 2013-06-11 DIAGNOSIS — F0390 Unspecified dementia without behavioral disturbance: Secondary | ICD-10-CM

## 2013-06-11 DIAGNOSIS — F419 Anxiety disorder, unspecified: Secondary | ICD-10-CM

## 2013-06-11 DIAGNOSIS — F329 Major depressive disorder, single episode, unspecified: Secondary | ICD-10-CM

## 2013-06-11 DIAGNOSIS — I82409 Acute embolism and thrombosis of unspecified deep veins of unspecified lower extremity: Secondary | ICD-10-CM

## 2013-06-11 DIAGNOSIS — F341 Dysthymic disorder: Secondary | ICD-10-CM

## 2013-06-11 DIAGNOSIS — I2510 Atherosclerotic heart disease of native coronary artery without angina pectoris: Secondary | ICD-10-CM

## 2013-06-11 NOTE — Assessment & Plan Note (Addendum)
reated by Hematology, takes Hydrea 1000mg  Monday, 500mg  Wed, Fri, Sun. Plt 413 12/05/12 and 264 05/15/13.Chronic anticoagulation with Xarelto. 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

## 2013-06-11 NOTE — Assessment & Plan Note (Signed)
Presently Namenda daily and SNF for care needs.                  

## 2013-06-11 NOTE — Assessment & Plan Note (Signed)
Stable on Omeprazole 20 mg daily.

## 2013-06-11 NOTE — Assessment & Plan Note (Signed)
Switched to [place] since 09/12/12. Left   

## 2013-06-11 NOTE — Assessment & Plan Note (Signed)
Controlled on Metoprolol 25mg bid.    

## 2013-06-11 NOTE — Assessment & Plan Note (Signed)
Managed with Fluticasone-Salmeterol 250/50 bid, dc'd Epipen prn and Immunotherapy per [facility] 01/17/13, Montelukast 10mg daily, and Albuterol II puffs q6hr prn. Stable 

## 2013-06-11 NOTE — Assessment & Plan Note (Signed)
Presently no angina--prn NTG, daily ASA 81mg , Atorvastatin 20mg  for risk reduction

## 2013-06-11 NOTE — Assessment & Plan Note (Signed)
Stable on Sertraline 25mg daily.    

## 2013-06-11 NOTE — Progress Notes (Signed)
Patient ID: Sherri Crawford, female   DOB: [birth year], 80 y.o.   MRN: [id number]    Patient ID: Sherri Crawford, female   DOB: 20-May-1920, 80 y.o.   MRN: [id number]  Code Status: Full Code.   Allergies  Allergen Reactions  . Sulfonamide Derivatives Other (See Comments)    Pt is unsure of reaction    Chief Complaint  Patient presents with  . Medical Managment of Chronic Issues    HPI: Patient is a 80 y.o. female seen in the SNF at [facility] today for evaluation of herchronic medical conditions.  Problem List Items Addressed This Visit   Anxiety and depression - Primary     Stable on Sertraline 25mg  daily.       BACK PAIN, LUMBAR     Hx of lower back pain, had X-ray of her lumbar spine 09/08/12--spondylosis-better controlled with Hydrocodone/ApAp  10/325mg  ac and hs  01/01/13 lumbar spine inj. Able to ambulate with walker on unit.       COPD with asthma     Managed with Fluticasone-Salmeterol 250/50 bid, dc'd Epipen prn and Immunotherapy per [facility] 01/17/13, Montelukast 10mg  daily, and Albuterol II puffs q6hr prn. Stable.       CORONARY ARTERY DISEASE     Presently no angina--prn NTG, daily ASA 81mg , Atorvastatin 20mg  for risk reduction    Deep vein thrombosis of left lower extremity     Switched to Xeralto since 09/12/12. Left       Dementia     Presently Namenda daily and SNF for care needs     Essential hypertension     Controlled on Metoprolol 25mg  bid.       Essential thrombocythemia     reated by Hematology, takes Hydrea 1000mg  Monday, 500mg  Wed, Fri, Sun. Plt 413 12/05/12 and 264 05/15/13.Chronic anticoagulation with Xarelto. 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          Review of Systems:  Review of Systems  Constitutional: Negative for fever, chills, weight loss, malaise/fatigue and diaphoresis.  HENT: Positive for hearing loss. Negative for  congestion, ear discharge, ear pain, nosebleeds, sore throat and tinnitus.   Eyes: Negative for blurred vision, double vision, photophobia, pain, discharge and redness.  Respiratory: Positive for cough. Negative for hemoptysis, sputum production, shortness of breath, wheezing (occasional. ) and stridor.   Cardiovascular: Negative for chest pain, palpitations, orthopnea, claudication and PND. Leg swelling: trace RLE-resolved.   Gastrointestinal: Negative for heartburn, nausea, vomiting, abdominal pain, diarrhea, constipation, blood in stool and melena.       Dysphagia.   Genitourinary: Positive for frequency. Negative for dysuria, urgency, hematuria and flank pain.  Musculoskeletal: Positive for joint pain (right hip). Negative for falls (recent and resulted in the right hip fx. ), myalgias and neck pain. Back pain: chronic back pain is managed with Norco.       Ambulates with walker.   Skin: Negative for itching and rash.       Itching scaly mid of R+L shins.   Neurological: Negative for dizziness, tingling, tremors, sensory change, speech change, focal weakness, seizures, loss of consciousness, weakness and headaches.  Endo/Heme/Allergies: Positive for environmental allergies. Negative for polydipsia. Bruises/bleeds easily.  Psychiatric/Behavioral: Positive for depression and memory loss. Negative for suicidal ideas, hallucinations and substance abuse. The patient is nervous/anxious. The patient does not have insomnia.      Past  Medical History  Diagnosis Date  . Macular degeneration   . COPD (chronic obstructive pulmonary disease)   . Hypertension   . Coronary artery disease   . Hypercholesterolemia   . GERD (gastroesophageal reflux disease)   . Asthmatic bronchitis   . History of colonic polyps   . DJD (degenerative joint disease)   . Lumbar back pain   . Osteoporosis   . History of transient ischemic attack (TIA)   . Anxiety   . Essential thrombocythemia   . Diverticulosis  05/10/2012  . Osteoarthrosis, unspecified whether generalized or localized, unspecified site 05/09/2012  . Memory loss 05/09/2012  . Abnormal weight gain 05/09/2012  . Phlebitis and thrombophlebitis of other deep vessels of lower extremities 07/10/2010  . Essential thrombocythemia 06/09/2009   Past Surgical History  Procedure Laterality Date  . Appendectomy    . Cholecystectomy    . Tah and bso  08/21/1995  . Cataract extraction w/ intraocular lens  implant, bilateral    . Abdominal hysterectomy    . Hip arthroplasty Right 09/09/2012    Procedure: ARTHROPLASTY BIPOLAR HIP;  Surgeon: Mauri Pole, MD;  Location: [facility];  Service: Orthopedics;  Laterality: Right;  . Esophagogastroduodenoscopy (egd) with esophageal dilation N/A 11/25/2012    Procedure: ESOPHAGOGASTRODUODENOSCOPY (EGD) WITH ESOPHAGEAL DILATION;  Surgeon: Jerene Bears, MD;  Location: [facility];  Service: Gastroenterology;  Laterality: N/A;   Social History:   reports that she has never smoked. She has never used smokeless tobacco. She reports that she does not drink alcohol or use illicit drugs.  Family History  Problem Relation Age of Onset  . Colon cancer Son 58  . Crohn's disease Son   . Cancer Maternal Aunt     Medications: Patient's Medications  New Prescriptions   No medications on file  Previous Medications   ALBUTEROL (PROAIR HFA) 108 (90 BASE) MCG/ACT INHALER    Inhale 2 puffs into the lungs every 6 (six) hours as needed for wheezing. As needed for wheezing   AMOXICILLIN (AMOXIL) 500 MG CAPSULE       ATORVASTATIN (LIPITOR) 20 MG TABLET    Take 20 mg by mouth every evening.    CHOLECALCIFEROL (VITAMIN D) 1000 UNITS TABLET    Take 1,000 Units by mouth every morning.   FEEDING SUPPLEMENT (RESOURCE BREEZE) LIQD    Take 1 Container by mouth 4 (four) times daily.   FLUTICASONE-SALMETEROL (ADVAIR) 250-50 MCG/DOSE AEPB    Inhale 1 puff into the lungs every 12 (twelve) hours.   HYDROCODONE-ACETAMINOPHEN (NORCO) 10-325  MG PER TABLET    Take one tablet by mouth four times daily for pain   HYDROXYUREA (HYDREA) 500 MG CAPSULE    Take 500 mg by mouth See admin instructions. Take 2 capsules on Monday. Take 1 capsule every other day of the week (Wed, Fri, Sun)   MEMANTINE (NAMENDA) 5 MG TABLET    Take 1 tablet (5 mg total) by mouth 2 (two) times daily.   METOPROLOL TARTRATE (LOPRESSOR) 25 MG TABLET    Take 25 mg by mouth 2 (two) times daily.   MONTELUKAST (SINGULAIR) 10 MG TABLET    Take 10 mg by mouth at bedtime.   MULTIPLE VITAMINS-MINERALS (CERTAVITE/ANTIOXIDANTS PO)    Take 1 capsule by mouth every morning.   NITROGLYCERIN (NITROSTAT) 0.4 MG SL TABLET    Place 0.4 mg under the tongue every 5 (five) minutes as needed for chest pain.    OMEPRAZOLE (PRILOSEC) 20 MG CAPSULE    Take  20 mg by mouth every morning.    RIVAROXABAN (XARELTO) 15 MG TABS TABLET    Take 15 mg by mouth daily.   SERTRALINE (ZOLOFT) 25 MG TABLET    Take 25 mg by mouth every evening.  Modified Medications   No medications on file  Discontinued Medications   No medications on file     Physical Exam: Physical Exam  Constitutional: She is oriented to person, place, and time. She appears well-developed and well-nourished. No distress.  HENT:  Head: Normocephalic and atraumatic.  Eyes: Conjunctivae and EOM are normal. Pupils are equal, round, and reactive to light.  Neck: Normal range of motion. Neck supple. No JVD present. No thyromegaly present.  Cardiovascular: Normal rate, regular rhythm and normal heart sounds.   Pulmonary/Chest: Effort normal. She has decreased breath sounds. She has no wheezes. She has no rales.  Decreased breath sounds Bilat. Lungs.   Abdominal: Soft. Bowel sounds are normal. There is no tenderness.  Musculoskeletal: Normal range of motion. She exhibits no edema. Tenderness: chronic lower back and the right hip since the fx. better controloled with scheduled Norco.   Lymphadenopathy:    She has no cervical  adenopathy.  Neurological: She is alert and oriented to person, place, and time. She has normal reflexes. No cranial nerve deficit. She exhibits normal muscle tone. Coordination normal.  Skin: Skin is warm and dry. No rash noted. She is not diaphoretic. No erythema (LLE).  The right hip surgical incision healed. Itching/scaly mid shins  Psychiatric: Her mood appears not anxious. Her affect is not angry, not blunt, not labile and not inappropriate. Her speech is not delayed, not tangential and not slurred. She is slowed. She is not agitated, not aggressive, not withdrawn and not combative. Thought content is not paranoid and not delusional. Cognition and memory are impaired. She does not express impulsivity or inappropriate judgment. She exhibits a depressed mood. She exhibits abnormal recent memory.    Filed Vitals:   06/11/13 1211  BP: 138/68  Pulse: 67  Temp: 98.5 F (36.9 C)  TempSrc: Tympanic  Resp: 18      Labs reviewed: Basic Metabolic Panel:  Recent Labs  07/16/12 1417  09/11/12 0455  10/18/12  11/22/12 1800 11/22/12 2249 11/23/12 0435 05/15/13 05/27/13 1446  NA 140  < > 136  < >  --   < > 141  --  143 141 140  K 4.4  < > 4.2  < >  --   < > 3.5  --  3.6 4.5 4.5  CL 106  < > 105  --   --   --  101  --  106  --   --   CO2 26  < > 26  --   --   < > 24  --  22  --  28  GLUCOSE 128*  < > 132*  --   --   < > 83  --  69*  --  121  BUN 23.0  < > 12  < >  --   < > 17  --  14 21 22.8  CREATININE 1.1  < > 0.74  < >  --   < > 0.74  --  0.75 0.8 1.1  CALCIUM 9.0  < > 8.4  --   --   < > 9.9  --  8.5  --  9.5  TSH  --   --   --   --  2.81  --   --  0.129*  --   --   --   < > = values in this interval not displayed. Liver Function Tests:  Recent Labs  11/22/12 1800 11/23/12 0435 05/15/13 05/27/13 1446  AST 23 22 16 19   ALT 12 11 10 11   ALKPHOS 81 73 47 63  BILITOT 0.5 0.5  --  0.33  PROT 7.1 6.1  --  6.5  ALBUMIN 3.7 3.0*  --  3.6   CBC:  Recent Labs  11/22/12 1800  11/23/12 0435  11/25/12 0505 11/26/12 0526 12/05/12 05/15/13 05/27/13 1549  WBC 8.2 8.4  < > 7.5 7.1 6.2 4.7 5.7  NEUTROABS 6.6 6.9  --   --   --   --   --  3.7  HGB 13.6 12.4  < > 11.5* 11.2* 11.9* 12.3 12.6  HCT 41.8 39.1  < > 35.9* 35.0* 35* 36 37.9  MCV 108.0* 110.1*  < > 107.2* 106.7*  --   --  110.1*  PLT 347 316  < > 269 291 413* 264 332  < > = values in this interval not displayed.  Past Procedures:  09/09/12  CT head:  IMPRESSION: No skull fracture or intracranial hemorrhage.  Small vessel disease type changes without CT evidence of large acute infarct.  X-ray right hip  IMPRESSION: Right femoral prosthesis without acute complication  X-ray Lumbar spine  IMPRESSION: Diffuse lumbar spondylosis.  No evidence of fracture.   X--ray R hip  IMPRESSION: Displaced right subcapital hip fracture.  11/25/12 GED: Esophageal dysmotility w/o definitive stricture. S/p empiric dilation in the lower third of the esophagus and across GE junction with TTS balloon to 12mm. Recommended diet as tolerated.    Assessment/Plan Anxiety and depression Stable on Sertraline 25mg  daily.     BACK PAIN, LUMBAR Hx of lower back pain, had X-ray of her lumbar spine 09/08/12--spondylosis-better controlled with Hydrocodone/ApAp  10/325mg  ac and hs  01/01/13 lumbar spine inj. Able to ambulate with walker on unit.     COPD with asthma Managed with Fluticasone-Salmeterol 250/50 bid, dc'd Epipen prn and Immunotherapy per [facility] 01/17/13, Montelukast 10mg  daily, and Albuterol II puffs q6hr prn. Stable.     CORONARY ARTERY DISEASE Presently no angina--prn NTG, daily ASA 81mg , Atorvastatin 20mg  for risk reduction  Deep vein thrombosis of left lower extremity Switched to Xeralto since 09/12/12. Left     Dementia Presently Namenda daily and SNF for care needs   Essential hypertension Controlled on Metoprolol 25mg  bid.     Essential thrombocythemia reated by Hematology,  takes Hydrea 1000mg  Monday, 500mg  Wed, Fri, Sun. Plt 413 12/05/12 and 264 05/15/13.Chronic anticoagulation with Xarelto. Uric acid 3.6 05/15/13(monitor for hyperuricemia related to Hydrea- tumor lysis)  GERD Stable on Omeprazole 20mg  daily.       Family/ Staff Communication: observe the patient  Goals of Care: failed AL and returned to SNF for care.   Labs/tests ordered: none

## 2013-06-11 NOTE — Assessment & Plan Note (Signed)
Hx of lower back pain, had X-ray of her lumbar spine 09/08/12--spondylosis-better controlled with Hydrocodone/ApAp  10/325mg  ac and hs  01/01/13 lumbar spine inj. Able to ambulate with walker on unit.

## 2013-06-25 ENCOUNTER — Non-Acute Institutional Stay (SKILLED_NURSING_FACILITY): Payer: Medicare Other | Admitting: Nurse Practitioner

## 2013-06-25 ENCOUNTER — Encounter: Payer: Self-pay | Admitting: Nurse Practitioner

## 2013-06-25 DIAGNOSIS — F0390 Unspecified dementia without behavioral disturbance: Secondary | ICD-10-CM

## 2013-06-25 DIAGNOSIS — F341 Dysthymic disorder: Secondary | ICD-10-CM

## 2013-06-25 DIAGNOSIS — F419 Anxiety disorder, unspecified: Secondary | ICD-10-CM

## 2013-06-25 DIAGNOSIS — D473 Essential (hemorrhagic) thrombocythemia: Secondary | ICD-10-CM

## 2013-06-25 DIAGNOSIS — W19XXXA Unspecified fall, initial encounter: Secondary | ICD-10-CM

## 2013-06-25 DIAGNOSIS — F329 Major depressive disorder, single episode, unspecified: Secondary | ICD-10-CM

## 2013-06-25 DIAGNOSIS — I10 Essential (primary) hypertension: Secondary | ICD-10-CM

## 2013-06-25 DIAGNOSIS — M545 Low back pain, unspecified: Secondary | ICD-10-CM

## 2013-06-25 DIAGNOSIS — F32A Depression, unspecified: Secondary | ICD-10-CM

## 2013-06-25 DIAGNOSIS — J449 Chronic obstructive pulmonary disease, unspecified: Secondary | ICD-10-CM

## 2013-06-25 DIAGNOSIS — K219 Gastro-esophageal reflux disease without esophagitis: Secondary | ICD-10-CM

## 2013-06-25 HISTORY — DX: Unspecified fall, initial encounter: W19.XXXA

## 2013-06-25 NOTE — Assessment & Plan Note (Signed)
Stable on Omeprazole 20 mg daily.

## 2013-06-25 NOTE — Assessment & Plan Note (Signed)
Managed with Fluticasone-Salmeterol 250/50 bid, dc'd Epipen prn and Immunotherapy per [facility] 01/17/13, Montelukast 10mg daily, and Albuterol II puffs q6hr prn. Stable 

## 2013-06-25 NOTE — Assessment & Plan Note (Signed)
F/u Hematology, takes Hydrea 1000mg Monday, 500mg Wed, Fri, Sun. Plt 413 12/05/12 and 264 05/15/13.Chronic anticoagulation with Xarelto. 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   

## 2013-06-25 NOTE — Assessment & Plan Note (Signed)
Stable on Sertraline 25mg  daily.

## 2013-06-25 NOTE — Assessment & Plan Note (Signed)
Hx of lower back pain, had X-ray of her lumbar spine 09/08/12--spondylosis-better controlled with Hydrocodone/ApAp  10/325mg  ac and hs  01/01/13 lumbar spine inj. Able to ambulate with walker on unit until her fall 06/22/13. Will obtain X-ray lumbar spine. Adding Oxycodone 5mg  q4hr prn for better pain control. F/u [name].

## 2013-06-25 NOTE — Progress Notes (Signed)
Patient ID: Sherri Crawford, female   DOB: Jul 23, 1920, 80 y.o.   MRN: [id number]    Patient ID: Sherri Crawford, female   DOB: 08-28-1920, 80 y.o.   MRN: [id number]  Code Status: Full Code.   Allergies  Allergen Reactions  . Sulfonamide Derivatives Other (See Comments)    Pt is unsure of reaction    Chief Complaint  Patient presents with  . Medical Management of Chronic Issues  . Acute Visit    worsened lower back pain, s/p fall.     HPI: Patient is a 80 y.o. female seen in the SNF at [facility] today for evaluation of worsened lower back pain, s/p fall, and herchronic medical conditions.  Problem List Items Addressed This Visit   Anxiety and depression     Stable on Sertraline 25mg  daily.     BACK PAIN, LUMBAR - Primary     Hx of lower back pain, had X-ray of her lumbar spine 09/08/12--spondylosis-better controlled with Hydrocodone/ApAp  10/325mg  ac and hs  01/01/13 lumbar spine inj. Able to ambulate with walker on unit until her fall 06/22/13. Will obtain X-ray lumbar spine. Adding Oxycodone 5mg  q4hr prn for better pain control. F/u [name].       Relevant Medications      oxyCODONE (OXY IR/ROXICODONE) 5 MG immediate release tablet   COPD with asthma     Managed with Fluticasone-Salmeterol 250/50 bid, dc'd Epipen prn and Immunotherapy per [facility] 01/17/13, Montelukast 10mg  daily, and Albuterol II puffs q6hr prn. Stable.       Dementia     Presently Namenda daily and SNF for care needs     Essential hypertension     Controlled on Metoprolol 25mg  bid.       Essential thrombocythemia     F/u Hematology, takes Hydrea 1000mg  Monday, 500mg  Wed, Fri, Sun. Plt 413 12/05/12 and 264 05/15/13.Chronic anticoagulation with Xarelto. 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  Intensive supervision needed.     GERD     Stable on Omeprazole 20mg  daily.           Review of Systems:  Review of Systems  Constitutional: Negative for fever, chills, weight loss, malaise/fatigue and diaphoresis.  HENT: Positive for hearing loss. Negative for congestion, ear discharge, ear pain, nosebleeds, sore throat and tinnitus.   Eyes: Negative for blurred vision, double vision, photophobia, pain, discharge and redness.  Respiratory: Positive for cough. Negative for hemoptysis, sputum production, shortness of breath, wheezing (occasional. ) and stridor.   Cardiovascular: Negative for chest pain, palpitations, orthopnea, claudication and PND. Leg swelling: trace RLE-resolved.   Gastrointestinal: Negative for heartburn, nausea, vomiting, abdominal pain, diarrhea, constipation, blood in stool and melena.       Dysphagia.   Genitourinary: Positive for frequency. Negative for dysuria, urgency, hematuria and flank pain.  Musculoskeletal: Positive for falls (recent and resulted in the right hip fx. ) and joint pain (right hip). Negative for myalgias and neck pain. Back pain: chronic back pain is managed with Norco.       Ambulates with walker. Hx of lower back pain: had multiple inj and presently taking Norco 5xday. Worsened lower back pain since her fall 06/22/13-no longer able to ambulate with walker today.   Skin: Negative for itching  and rash.       Itching scaly mid of R+L shins.   Neurological: Negative for dizziness, tingling, tremors, sensory change, speech change, focal weakness, seizures, loss of consciousness, weakness and headaches.  Endo/Heme/Allergies: Positive for environmental allergies. Negative for polydipsia. Bruises/bleeds easily.  Psychiatric/Behavioral: Positive for depression and memory loss. Negative for suicidal ideas, hallucinations and substance abuse. The patient is nervous/anxious. The patient does not have insomnia.      Past Medical History  Diagnosis  Date  . Macular degeneration   . COPD (chronic obstructive pulmonary disease)   . Hypertension   . Coronary artery disease   . Hypercholesterolemia   . GERD (gastroesophageal reflux disease)   . Asthmatic bronchitis   . History of colonic polyps   . DJD (degenerative joint disease)   . Lumbar back pain   . Osteoporosis   . History of transient ischemic attack (TIA)   . Anxiety   . Essential thrombocythemia   . Diverticulosis 05/10/2012  . Osteoarthrosis, unspecified whether generalized or localized, unspecified site 05/09/2012  . Memory loss 05/09/2012  . Abnormal weight gain 05/09/2012  . Phlebitis and thrombophlebitis of other deep vessels of lower extremities 07/10/2010  . Essential thrombocythemia 06/09/2009  . Depression    Past Surgical History  Procedure Laterality Date  . Appendectomy    . Cholecystectomy    . Tah and bso  08/21/1995  . Cataract extraction w/ intraocular lens  implant, bilateral    . Abdominal hysterectomy    . Hip arthroplasty Right 09/09/2012    Procedure: ARTHROPLASTY BIPOLAR HIP;  Surgeon: Mauri Pole, MD;  Location: [facility];  Service: Orthopedics;  Laterality: Right;  . Esophagogastroduodenoscopy (egd) with esophageal dilation N/A 11/25/2012    Procedure: ESOPHAGOGASTRODUODENOSCOPY (EGD) WITH ESOPHAGEAL DILATION;  Surgeon: Jerene Bears, MD;  Location: [facility];  Service: Gastroenterology;  Laterality: N/A;   Social History:   reports that she has never smoked. She has never used smokeless tobacco. She reports that she does not drink alcohol or use illicit drugs.  Family History  Problem Relation Age of Onset  . Colon cancer Son 29  . Crohn's disease Son   . Cancer Maternal Aunt     Medications: Patient's Medications  New Prescriptions   No medications on file  Previous Medications   ALBUTEROL (PROAIR HFA) 108 (90 BASE) MCG/ACT INHALER    Inhale 2 puffs into the lungs every 6 (six) hours as needed for wheezing. As needed for wheezing    AMOXICILLIN (AMOXIL) 500 MG CAPSULE       ATORVASTATIN (LIPITOR) 20 MG TABLET    Take 20 mg by mouth every evening.    CHOLECALCIFEROL (VITAMIN D) 1000 UNITS TABLET    Take 1,000 Units by mouth every morning.   FEEDING SUPPLEMENT (RESOURCE BREEZE) LIQD    Take 1 Container by mouth 4 (four) times daily.   FLUTICASONE-SALMETEROL (ADVAIR) 250-50 MCG/DOSE AEPB    Inhale 1 puff into the lungs every 12 (twelve) hours.   HYDROCODONE-ACETAMINOPHEN (NORCO) 10-325 MG PER TABLET    Take one tablet by mouth four times daily for pain   HYDROXYUREA (HYDREA) 500 MG CAPSULE    Take 500 mg by mouth See admin instructions. Take 2 capsules on Monday. Take 1 capsule every other day of the week (Wed, Fri, Sun)   MEMANTINE (NAMENDA) 5 MG TABLET    Take 1 tablet (5 mg total) by mouth 2 (two) times daily.   METOPROLOL TARTRATE (LOPRESSOR) 25 MG TABLET  Take 25 mg by mouth 2 (two) times daily.   MONTELUKAST (SINGULAIR) 10 MG TABLET    Take 10 mg by mouth at bedtime.   MULTIPLE VITAMINS-MINERALS (CERTAVITE/ANTIOXIDANTS PO)    Take 1 capsule by mouth every morning.   NITROGLYCERIN (NITROSTAT) 0.4 MG SL TABLET    Place 0.4 mg under the tongue every 5 (five) minutes as needed for chest pain.    OMEPRAZOLE (PRILOSEC) 20 MG CAPSULE    Take 20 mg by mouth every morning.    OXYCODONE (OXY IR/ROXICODONE) 5 MG IMMEDIATE RELEASE TABLET    Take 5 mg by mouth every 4 (four) hours as needed for severe pain.   RIVAROXABAN (XARELTO) 15 MG TABS TABLET    Take 15 mg by mouth daily.   SERTRALINE (ZOLOFT) 25 MG TABLET    Take 25 mg by mouth every evening.  Modified Medications   No medications on file  Discontinued Medications   No medications on file     Physical Exam: Physical Exam  Constitutional: She is oriented to person, place, and time. She appears well-developed and well-nourished. No distress.  HENT:  Head: Normocephalic and atraumatic.  Eyes: Conjunctivae and EOM are normal. Pupils are equal, round, and reactive to  light.  Neck: Normal range of motion. Neck supple. No JVD present. No thyromegaly present.  Cardiovascular: Normal rate, regular rhythm and normal heart sounds.   Pulmonary/Chest: Effort normal. She has decreased breath sounds. She has no wheezes. She has no rales.  Decreased breath sounds Bilat. Lungs.   Abdominal: Soft. Bowel sounds are normal. There is no tenderness.  Musculoskeletal: Normal range of motion. She exhibits tenderness (chronic lower back and the right hip since the fx. better controloled with scheduled Norco. ). She exhibits no edema.  Lower back.  Lymphadenopathy:    She has no cervical adenopathy.  Neurological: She is alert and oriented to person, place, and time. She has normal reflexes. No cranial nerve deficit. She exhibits normal muscle tone. Coordination normal.  Skin: Skin is warm and dry. No rash noted. She is not diaphoretic. No erythema (LLE).  The right hip surgical incision healed. Itching/scaly mid shins  Psychiatric: Her mood appears not anxious. Her affect is not angry, not blunt, not labile and not inappropriate. Her speech is not delayed, not tangential and not slurred. She is slowed. She is not agitated, not aggressive, not withdrawn and not combative. Thought content is not paranoid and not delusional. Cognition and memory are impaired. She does not express impulsivity or inappropriate judgment. She exhibits a depressed mood. She exhibits abnormal recent memory.    Filed Vitals:   06/25/13 1513  BP: 117/70  Pulse: 74  Temp: 98.6 F (37 C)  TempSrc: Tympanic  Resp: 17      Labs reviewed: Basic Metabolic Panel:  Recent Labs  07/16/12 1417  09/11/12 0455  10/18/12  11/22/12 1800 11/22/12 2249 11/23/12 0435 05/15/13 05/27/13 1446  NA 140  < > 136  < >  --   < > 141  --  143 141 140  K 4.4  < > 4.2  < >  --   < > 3.5  --  3.6 4.5 4.5  CL 106  < > 105  --   --   --  101  --  106  --   --   CO2 26  < > 26  --   --   < > 24  --  22  --  28  GLUCOSE 128*  < > 132*  --   --   < > 83  --  69*  --  121  BUN 23.0  < > 12  < >  --   < > 17  --  14 21 22.8  CREATININE 1.1  < > 0.74  < >  --   < > 0.74  --  0.75 0.8 1.1  CALCIUM 9.0  < > 8.4  --   --   < > 9.9  --  8.5  --  9.5  TSH  --   --   --   --  2.81  --   --  0.129*  --   --   --   < > = values in this interval not displayed. Liver Function Tests:  Recent Labs  11/22/12 1800 11/23/12 0435 05/15/13 05/27/13 1446  AST 23 22 16 19   ALT 12 11 10 11   ALKPHOS 81 73 47 63  BILITOT 0.5 0.5  --  0.33  PROT 7.1 6.1  --  6.5  ALBUMIN 3.7 3.0*  --  3.6   CBC:  Recent Labs  11/22/12 1800 11/23/12 0435  11/25/12 0505 11/26/12 0526 12/05/12 05/15/13 05/27/13 1549  WBC 8.2 8.4  < > 7.5 7.1 6.2 4.7 5.7  NEUTROABS 6.6 6.9  --   --   --   --   --  3.7  HGB 13.6 12.4  < > 11.5* 11.2* 11.9* 12.3 12.6  HCT 41.8 39.1  < > 35.9* 35.0* 35* 36 37.9  MCV 108.0* 110.1*  < > 107.2* 106.7*  --   --  110.1*  PLT 347 316  < > 269 291 413* 264 332  < > = values in this interval not displayed.  Past Procedures:  09/09/12  CT head:  IMPRESSION: No skull fracture or intracranial hemorrhage.  Small vessel disease type changes without CT evidence of large acute infarct.  X-ray right hip  IMPRESSION: Right femoral prosthesis without acute complication  X-ray Lumbar spine  IMPRESSION: Diffuse lumbar spondylosis.  No evidence of fracture.   X--ray R hip  IMPRESSION: Displaced right subcapital hip fracture.  11/25/12 GED: Esophageal dysmotility w/o definitive stricture. S/p empiric dilation in the lower third of the esophagus and across GE junction with TTS balloon to 76mm. Recommended diet as tolerated.    Assessment/Plan BACK PAIN, LUMBAR Hx of lower back pain, had X-ray of her lumbar spine 09/08/12--spondylosis-better controlled with Hydrocodone/ApAp  10/325mg  ac and hs  01/01/13 lumbar spine inj. Able to ambulate with walker on unit until her fall 06/22/13. Will obtain X-ray  lumbar spine. Adding Oxycodone 5mg  q4hr prn for better pain control. F/u [name].     Anxiety and depression Stable on Sertraline 25mg  daily.   Essential thrombocythemia F/u Hematology, takes Hydrea 1000mg  Monday, 500mg  Wed, Fri, Sun. Plt 413 12/05/12 and 264 05/15/13.Chronic anticoagulation with Xarelto. 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     COPD with asthma Managed with Fluticasone-Salmeterol 250/50 bid, dc'd Epipen prn and Immunotherapy per [facility] 01/17/13, Montelukast 10mg  daily, and Albuterol II puffs q6hr prn. Stable.     GERD Stable on Omeprazole 20mg  daily.      Fall Lack of safety awareness contributed to her falling. Intensive supervision needed.     Family/ Staff Communication: observe the patient  Goals  of Care: failed AL and returned to SNF for care.   Labs/tests ordered: X-ray lumbar spine.

## 2013-06-25 NOTE — Assessment & Plan Note (Signed)
Lack of safety awareness contributed to her falling. Intensive supervision needed.

## 2013-06-25 NOTE — Assessment & Plan Note (Signed)
Presently Namenda daily and SNF for care needs.                  

## 2013-06-25 NOTE — Assessment & Plan Note (Signed)
Controlled on Metoprolol 25mg bid.    

## 2013-06-26 LAB — CBC AND DIFFERENTIAL
HCT: 34 % — AB (ref 36–46)
Hemoglobin: 11.8 g/dL — AB (ref 12.0–16.0)
Platelets: 288 10*3/uL (ref 150–399)
WBC: 6 10*3/mL

## 2013-07-16 ENCOUNTER — Encounter: Payer: Self-pay | Admitting: Nurse Practitioner

## 2013-07-16 ENCOUNTER — Non-Acute Institutional Stay (SKILLED_NURSING_FACILITY): Payer: Medicare Other | Admitting: Nurse Practitioner

## 2013-07-16 DIAGNOSIS — D473 Essential (hemorrhagic) thrombocythemia: Secondary | ICD-10-CM

## 2013-07-16 DIAGNOSIS — M545 Low back pain, unspecified: Secondary | ICD-10-CM

## 2013-07-16 DIAGNOSIS — F419 Anxiety disorder, unspecified: Principal | ICD-10-CM

## 2013-07-16 DIAGNOSIS — F329 Major depressive disorder, single episode, unspecified: Secondary | ICD-10-CM

## 2013-07-16 DIAGNOSIS — J449 Chronic obstructive pulmonary disease, unspecified: Secondary | ICD-10-CM

## 2013-07-16 DIAGNOSIS — F32A Depression, unspecified: Secondary | ICD-10-CM

## 2013-07-16 DIAGNOSIS — F0390 Unspecified dementia without behavioral disturbance: Secondary | ICD-10-CM

## 2013-07-16 DIAGNOSIS — K219 Gastro-esophageal reflux disease without esophagitis: Secondary | ICD-10-CM

## 2013-07-16 DIAGNOSIS — F341 Dysthymic disorder: Secondary | ICD-10-CM

## 2013-07-16 NOTE — Assessment & Plan Note (Signed)
Worsened. Since husband has been going down the resident had been extremely upset. Packing in w/c. Has thrown  Up x4 in the past 2-3days. She says her nerves are just shot. Will increase Sertraline to 50mg  daily. Lorazepam 0.5mg  q4h prn available to her. May consider CBC, CMP, UA C/S if no better.

## 2013-07-16 NOTE — Assessment & Plan Note (Signed)
Managed with Fluticasone-Salmeterol 250/50 bid, dc'd Epipen prn and Immunotherapy per [facility] 01/17/13, Montelukast 10mg daily, and Albuterol II puffs q6hr prn. Stable 

## 2013-07-16 NOTE — Progress Notes (Signed)
Patent ID: Sherri Crawford, female   DOB: Jun 13, 1920, 80 y.o.   MRN: [id number]    Patient ID: Sherri Crawford, female   DOB: 06/22/1920, 80 y.o.   MRN: [id number]  Code Status: Full Code.   Allergies  Allergen Reactions  . Sulfonamide Derivatives Other (See Comments)    Pt is unsure of reaction    Chief Complaint  Patient presents with  . Medical Management of Chronic Issues  . Acute Visit    c/o nerves are just fhot    HPI: Patient is a 80 y.o. female seen in the SNF at [facility] today for evaluation of c/o "nerves are just shot", lower back pain, and chronic medical conditions.  Problem List Items Addressed This Visit   Anxiety and depression - Primary     Worsened. Since husband has been going down the resident had been extremely upset. Packing in w/c. Has thrown  Up x4 in the past 2-3days. She says her nerves are just shot. Will increase Sertraline to 50mg  daily. Lorazepam 0.5mg  q4h prn available to her. May consider CBC, CMP, UA C/S if no better.     BACK PAIN, LUMBAR     Hx of lower back pain, had X-ray of her lumbar spine 09/08/12--spondylosis-better controlled with Hydrocodone/ApAp  10/325mg  ac and hs  01/01/13 lumbar spine inj. Able to ambulate with walker on unit until her fall 06/22/13. 06/25/13 unremarkable. X-ray lumbar spine. continue Oxycodone 5mg  q4hr prn for better pain control. F/u Sherri Crawford.       COPD with asthma     Managed with Fluticasone-Salmeterol 250/50 bid, dc'd Epipen prn and Immunotherapy per [facility] 01/17/13, Montelukast 10mg  daily, and Albuterol II puffs q6hr prn. Stable.       Dementia     Presently Namenda daily and SNF for care needs     Relevant Medications      sertraline (ZOLOFT) 50 MG tablet   Essential thrombocythemia     F/u Hematology, takes Hydrea 1000mg  Monday, 500mg  Wed, Fri, Sun. Plt 413 12/05/12 and 264 05/15/13 and 288 06/26/13.Chronic anticoagulation with Xarelto. Uric acid 3.6 05/15/13(monitor for hyperuricemia related  to Hydrea- tumor lysis)      GERD     Stable on Omeprazole 20mg  daily.          Review of Systems:  Review of Systems  Constitutional: Negative for fever, chills, weight loss, malaise/fatigue and diaphoresis.  HENT: Positive for hearing loss. Negative for congestion, ear discharge, ear pain, nosebleeds, sore throat and tinnitus.   Eyes: Negative for blurred vision, double vision, photophobia, pain, discharge and redness.  Respiratory: Positive for cough. Negative for hemoptysis, sputum production, shortness of breath, wheezing (occasional. ) and stridor.   Cardiovascular: Negative for chest pain, palpitations, orthopnea, claudication and PND. Leg swelling: trace RLE-resolved.   Gastrointestinal: Positive for vomiting. Negative for heartburn, nausea, abdominal pain, diarrhea, constipation, blood in stool and melena.       Dysphagia.   Genitourinary: Positive for frequency. Negative for dysuria, urgency, hematuria and flank pain.  Musculoskeletal: Positive for falls (recent and resulted in the right hip fx. ) and joint pain (right hip). Negative for myalgias and neck pain. Back pain: chronic back pain is managed with Norco.       Ambulates with walker. Hx of lower back pain: had multiple inj and presently taking Norco 5xday. Worsened lower back pain since her fall 06/22/13-no longer able to ambulate with walker today.   Skin: Negative for itching and rash.  Itching scaly mid of R+L shins.   Neurological: Negative for dizziness, tingling, tremors, sensory change, speech change, focal weakness, seizures, loss of consciousness, weakness and headaches.  Endo/Heme/Allergies: Positive for environmental allergies. Negative for polydipsia. Bruises/bleeds easily.  Psychiatric/Behavioral: Positive for depression and memory loss. Negative for suicidal ideas, hallucinations and substance abuse. The patient is nervous/anxious. The patient does not have insomnia.      Past Medical History    Diagnosis Date  . Macular degeneration   . COPD (chronic obstructive pulmonary disease)   . Hypertension   . Coronary artery disease   . Hypercholesterolemia   . GERD (gastroesophageal reflux disease)   . Asthmatic bronchitis   . History of colonic polyps   . DJD (degenerative joint disease)   . Lumbar back pain   . Osteoporosis   . History of transient ischemic attack (TIA)   . Anxiety   . Essential thrombocythemia   . Diverticulosis 05/10/2012  . Osteoarthrosis, unspecified whether generalized or localized, unspecified site 05/09/2012  . Memory loss 05/09/2012  . Abnormal weight gain 05/09/2012  . Phlebitis and thrombophlebitis of other deep vessels of lower extremities 07/10/2010  . Essential thrombocythemia 06/09/2009  . Depression    Past Surgical History  Procedure Laterality Date  . Appendectomy    . Cholecystectomy    . Tah and bso  08/21/1995  . Cataract extraction w/ intraocular lens  implant, bilateral    . Abdominal hysterectomy    . Hip arthroplasty Right 09/09/2012    Procedure: ARTHROPLASTY BIPOLAR HIP;  Surgeon: Mauri Pole, MD;  Location: [facility];  Service: Orthopedics;  Laterality: Right;  . Esophagogastroduodenoscopy (egd) with esophageal dilation N/A 11/25/2012    Procedure: ESOPHAGOGASTRODUODENOSCOPY (EGD) WITH ESOPHAGEAL DILATION;  Surgeon: Jerene Bears, MD;  Location: [facility];  Service: Gastroenterology;  Laterality: N/A;   Social History:   reports that she has never smoked. She has never used smokeless tobacco. She reports that she does not drink alcohol or use illicit drugs.  Family History  Problem Relation Age of Onset  . Colon cancer Son 72  . Crohn's disease Son   . Cancer Maternal Aunt     Medications: Patient's Medications  New Prescriptions   No medications on file  Previous Medications   ALBUTEROL (PROAIR HFA) 108 (90 BASE) MCG/ACT INHALER    Inhale 2 puffs into the lungs every 6 (six) hours as needed for wheezing. As needed for  wheezing   AMOXICILLIN (AMOXIL) 500 MG CAPSULE       ATORVASTATIN (LIPITOR) 20 MG TABLET    Take 20 mg by mouth every evening.    CHOLECALCIFEROL (VITAMIN D) 1000 UNITS TABLET    Take 1,000 Units by mouth every morning.   FEEDING SUPPLEMENT (RESOURCE BREEZE) LIQD    Take 1 Container by mouth 4 (four) times daily.   FLUTICASONE-SALMETEROL (ADVAIR) 250-50 MCG/DOSE AEPB    Inhale 1 puff into the lungs every 12 (twelve) hours.   HYDROCODONE-ACETAMINOPHEN (NORCO) 10-325 MG PER TABLET    Take one tablet by mouth four times daily for pain   HYDROXYUREA (HYDREA) 500 MG CAPSULE    Take 500 mg by mouth See admin instructions. Take 2 capsules on Monday. Take 1 capsule every other day of the week (Wed, Fri, Sun)   MEMANTINE (NAMENDA) 5 MG TABLET    Take 1 tablet (5 mg total) by mouth 2 (two) times daily.   METOPROLOL TARTRATE (LOPRESSOR) 25 MG TABLET    Take 25 mg by mouth  2 (two) times daily.   MONTELUKAST (SINGULAIR) 10 MG TABLET    Take 10 mg by mouth at bedtime.   MULTIPLE VITAMINS-MINERALS (CERTAVITE/ANTIOXIDANTS PO)    Take 1 capsule by mouth every morning.   NITROGLYCERIN (NITROSTAT) 0.4 MG SL TABLET    Place 0.4 mg under the tongue every 5 (five) minutes as needed for chest pain.    OMEPRAZOLE (PRILOSEC) 20 MG CAPSULE    Take 20 mg by mouth every morning.    OXYCODONE (OXY IR/ROXICODONE) 5 MG IMMEDIATE RELEASE TABLET    Take 5 mg by mouth every 4 (four) hours as needed for severe pain.   RIVAROXABAN (XARELTO) 15 MG TABS TABLET    Take 15 mg by mouth daily.   SERTRALINE (ZOLOFT) 50 MG TABLET    Take 50 mg by mouth daily.  Modified Medications   No medications on file  Discontinued Medications   SERTRALINE (ZOLOFT) 25 MG TABLET    Take 25 mg by mouth every evening.     Physical Exam: Physical Exam  Constitutional: She is oriented to person, place, and time. She appears well-developed and well-nourished. No distress.  HENT:  Head: Normocephalic and atraumatic.  Eyes: Conjunctivae and EOM are  normal. Pupils are equal, round, and reactive to light.  Neck: Normal range of motion. Neck supple. No JVD present. No thyromegaly present.  Cardiovascular: Normal rate, regular rhythm and normal heart sounds.   Pulmonary/Chest: Effort normal. She has decreased breath sounds. She has no wheezes. She has no rales.  Decreased breath sounds Bilat. Lungs.   Abdominal: Soft. Bowel sounds are normal. There is no tenderness.  Musculoskeletal: Normal range of motion. She exhibits tenderness (chronic lower back and the right hip since the fx. better controloled with scheduled Norco. ). She exhibits no edema.  Lower back.  Lymphadenopathy:    She has no cervical adenopathy.  Neurological: She is alert and oriented to person, place, and time. She has normal reflexes. No cranial nerve deficit. She exhibits normal muscle tone. Coordination normal.  Skin: Skin is warm and dry. No rash noted. She is not diaphoretic. No erythema (LLE).  The right hip surgical incision healed. Itching/scaly mid shins  Psychiatric: Her mood appears anxious. Her affect is not angry, not blunt, not labile and not inappropriate. Her speech is not delayed, not tangential and not slurred. She is hyperactive and slowed. She is not agitated, not aggressive, not withdrawn and not combative. Thought content is not paranoid and not delusional. Cognition and memory are impaired. She does not express impulsivity or inappropriate judgment. She exhibits a depressed mood. She exhibits abnormal recent memory.    Filed Vitals:   07/16/13 1106  BP: 116/60  Pulse: 64  Temp: 98.2 F (36.8 C)  TempSrc: Tympanic  Resp: 20  SpO2: 98%      Labs reviewed: Basic Metabolic Panel:  Recent Labs  07/16/12 1417  09/11/12 0455  10/18/12  11/22/12 1800 11/22/12 2249 11/23/12 0435 05/15/13 05/27/13 1446  NA 140  < > 136  < >  --   < > 141  --  143 141 140  K 4.4  < > 4.2  < >  --   < > 3.5  --  3.6 4.5 4.5  CL 106  < > 105  --   --   --  101   --  106  --   --   CO2 26  < > 26  --   --   < >  24  --  22  --  28  GLUCOSE 128*  < > 132*  --   --   < > 83  --  69*  --  121  BUN 23.0  < > 12  < >  --   < > 17  --  14 21 22.8  CREATININE 1.1  < > 0.74  < >  --   < > 0.74  --  0.75 0.8 1.1  CALCIUM 9.0  < > 8.4  --   --   < > 9.9  --  8.5  --  9.5  TSH  --   --   --   --  2.81  --   --  0.129*  --   --   --   < > = values in this interval not displayed. Liver Function Tests:  Recent Labs  11/22/12 1800 11/23/12 0435 05/15/13 05/27/13 1446  AST 23 22 16 19   ALT 12 11 10 11   ALKPHOS 81 73 47 63  BILITOT 0.5 0.5  --  0.33  PROT 7.1 6.1  --  6.5  ALBUMIN 3.7 3.0*  --  3.6   CBC:  Recent Labs  11/22/12 1800 11/23/12 0435  11/25/12 0505 11/26/12 0526  05/15/13 05/27/13 1549 06/26/13  WBC 8.2 8.4  < > 7.5 7.1  < > 4.7 5.7 6.0  NEUTROABS 6.6 6.9  --   --   --   --   --  3.7  --   HGB 13.6 12.4  < > 11.5* 11.2*  < > 12.3 12.6 11.8*  HCT 41.8 39.1  < > 35.9* 35.0*  < > 36 37.9 34*  MCV 108.0* 110.1*  < > 107.2* 106.7*  --   --  110.1*  --   PLT 347 316  < > 269 291  < > 264 332 288  < > = values in this interval not displayed.  Past Procedures:  09/09/12  CT head:  IMPRESSION: No skull fracture or intracranial hemorrhage.  Small vessel disease type changes without CT evidence of large acute infarct.  X-ray right hip  IMPRESSION: Right femoral prosthesis without acute complication  X-ray Lumbar spine  IMPRESSION: Diffuse lumbar spondylosis.  No evidence of fracture.   X--ray R hip  IMPRESSION: Displaced right subcapital hip fracture.  11/25/12 GED: Esophageal dysmotility w/o definitive stricture. S/p empiric dilation in the lower third of the esophagus and across GE junction with TTS balloon to 54mm. Recommended diet as tolerated.   06/25/13 X-ray lumbar spine: disc disease, slight rotoscoliosis curvature to the left, the is a right hip prosthesis present, there is an eggshell calcification in the right upper  quadrant of uncertain etiology.    Assessment/Plan Anxiety and depression Worsened. Since husband has been going down the resident had been extremely upset. Packing in w/c. Has thrown  Up x4 in the past 2-3days. She says her nerves are just shot. Will increase Sertraline to 50mg  daily. Lorazepam 0.5mg  q4h prn available to her. May consider CBC, CMP, UA C/S if no better.   Essential thrombocythemia F/u Hematology, takes Hydrea 1000mg  Monday, 500mg  Wed, Fri, Sun. Plt 413 12/05/12 and 264 05/15/13 and 288 06/26/13.Chronic anticoagulation with Xarelto. Uric acid 3.6 05/15/13(monitor for hyperuricemia related to Hydrea- tumor lysis)    Dementia Presently Namenda daily and SNF for care needs   BACK PAIN, LUMBAR Hx of lower back pain, had X-ray of her lumbar spine 09/08/12--spondylosis-better controlled with Hydrocodone/ApAp  10/325mg   ac and hs  01/01/13 lumbar spine inj. Able to ambulate with walker on unit until her fall 06/22/13. 06/25/13 unremarkable. X-ray lumbar spine. continue Oxycodone 5mg  q4hr prn for better pain control. F/u Sherri Crawford.     COPD with asthma Managed with Fluticasone-Salmeterol 250/50 bid, dc'd Epipen prn and Immunotherapy per [facility] 01/17/13, Montelukast 10mg  daily, and Albuterol II puffs q6hr prn. Stable.     GERD Stable on Omeprazole 20mg  daily.       Family/ Staff Communication: observe the patient  Goals of Care: failed AL and returned to SNF for care.   Labs/tests ordered: none

## 2013-07-16 NOTE — Assessment & Plan Note (Signed)
Stable on Omeprazole 20 mg daily.

## 2013-07-16 NOTE — Assessment & Plan Note (Signed)
Presently Namenda daily and SNF for care needs.                  

## 2013-07-16 NOTE — Assessment & Plan Note (Signed)
F/u Hematology, takes Hydrea 1000mg  Monday, 500mg  Wed, Fri, Sun. Plt 413 12/05/12 and 264 05/15/13 and 288 06/26/13.Chronic anticoagulation with Xarelto. 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

## 2013-07-16 NOTE — Assessment & Plan Note (Signed)
Hx of lower back pain, had X-ray of her lumbar spine 09/08/12--spondylosis-better controlled with Hydrocodone/ApAp  10/325mg ac and hs  01/01/13 lumbar spine inj. Able to ambulate with walker on unit until her fall 06/22/13. 06/25/13 unremarkable. X-ray lumbar spine. continue Oxycodone 5mg q4hr prn for better pain control. F/u Orth.    

## 2013-07-26 ENCOUNTER — Encounter (HOSPITAL_COMMUNITY): Payer: Self-pay | Admitting: Emergency Medicine

## 2013-07-26 ENCOUNTER — Emergency Department (HOSPITAL_COMMUNITY)
Admission: EM | Admit: 2013-07-26 | Discharge: 2013-07-26 | Disposition: A | Discharge status: Home / Self Care | Payer: Medicare Other | Attending: Emergency Medicine | Admitting: Emergency Medicine

## 2013-07-26 ENCOUNTER — Emergency Department (HOSPITAL_COMMUNITY): Payer: Medicare Other

## 2013-07-26 ENCOUNTER — Encounter (HOSPITAL_COMMUNITY)
Admission: EM | Disposition: A | Discharge status: Home / Self Care | Payer: Self-pay | Source: Home / Self Care | Attending: Emergency Medicine

## 2013-07-26 DIAGNOSIS — F411 Generalized anxiety disorder: Secondary | ICD-10-CM | POA: Diagnosis not present

## 2013-07-26 DIAGNOSIS — Z792 Long term (current) use of antibiotics: Secondary | ICD-10-CM | POA: Insufficient documentation

## 2013-07-26 DIAGNOSIS — K224 Dyskinesia of esophagus: Secondary | ICD-10-CM

## 2013-07-26 DIAGNOSIS — Z8672 Personal history of thrombophlebitis: Secondary | ICD-10-CM | POA: Insufficient documentation

## 2013-07-26 DIAGNOSIS — I10 Essential (primary) hypertension: Secondary | ICD-10-CM | POA: Insufficient documentation

## 2013-07-26 DIAGNOSIS — M810 Age-related osteoporosis without current pathological fracture: Secondary | ICD-10-CM | POA: Insufficient documentation

## 2013-07-26 DIAGNOSIS — R079 Chest pain, unspecified: Secondary | ICD-10-CM | POA: Insufficient documentation

## 2013-07-26 DIAGNOSIS — Z7901 Long term (current) use of anticoagulants: Secondary | ICD-10-CM | POA: Diagnosis not present

## 2013-07-26 DIAGNOSIS — Z8673 Personal history of transient ischemic attack (TIA), and cerebral infarction without residual deficits: Secondary | ICD-10-CM | POA: Insufficient documentation

## 2013-07-26 DIAGNOSIS — R1310 Dysphagia, unspecified: Secondary | ICD-10-CM

## 2013-07-26 DIAGNOSIS — R0602 Shortness of breath: Secondary | ICD-10-CM | POA: Diagnosis not present

## 2013-07-26 DIAGNOSIS — F329 Major depressive disorder, single episode, unspecified: Secondary | ICD-10-CM | POA: Insufficient documentation

## 2013-07-26 DIAGNOSIS — J449 Chronic obstructive pulmonary disease, unspecified: Secondary | ICD-10-CM | POA: Insufficient documentation

## 2013-07-26 DIAGNOSIS — T18128A Food in esophagus causing other injury, initial encounter: Secondary | ICD-10-CM

## 2013-07-26 DIAGNOSIS — IMO0002 Reserved for concepts with insufficient information to code with codable children: Secondary | ICD-10-CM | POA: Insufficient documentation

## 2013-07-26 DIAGNOSIS — F3289 Other specified depressive episodes: Secondary | ICD-10-CM | POA: Insufficient documentation

## 2013-07-26 DIAGNOSIS — K219 Gastro-esophageal reflux disease without esophagitis: Secondary | ICD-10-CM | POA: Diagnosis not present

## 2013-07-26 DIAGNOSIS — M1990 Unspecified osteoarthritis, unspecified site: Secondary | ICD-10-CM | POA: Insufficient documentation

## 2013-07-26 DIAGNOSIS — B3781 Candidal esophagitis: Secondary | ICD-10-CM | POA: Diagnosis not present

## 2013-07-26 DIAGNOSIS — E7800 Pure hypercholesterolemia, unspecified: Secondary | ICD-10-CM | POA: Diagnosis not present

## 2013-07-26 DIAGNOSIS — K222 Esophageal obstruction: Secondary | ICD-10-CM

## 2013-07-26 DIAGNOSIS — T18108A Unspecified foreign body in esophagus causing other injury, initial encounter: Secondary | ICD-10-CM

## 2013-07-26 DIAGNOSIS — J4489 Other specified chronic obstructive pulmonary disease: Secondary | ICD-10-CM | POA: Insufficient documentation

## 2013-07-26 DIAGNOSIS — I2510 Atherosclerotic heart disease of native coronary artery without angina pectoris: Secondary | ICD-10-CM | POA: Diagnosis not present

## 2013-07-26 DIAGNOSIS — R933 Abnormal findings on diagnostic imaging of other parts of digestive tract: Secondary | ICD-10-CM

## 2013-07-26 HISTORY — PX: ESOPHAGOGASTRODUODENOSCOPY: SHX5428

## 2013-07-26 HISTORY — DX: Dyskinesia of esophagus: K22.4

## 2013-07-26 LAB — CBC WITH DIFFERENTIAL/PLATELET
BASOS PCT: 0 % (ref 0–1)
Basophils Absolute: 0 10*3/uL (ref 0.0–0.1)
Eosinophils Absolute: 0 10*3/uL (ref 0.0–0.7)
Eosinophils Relative: 0 % (ref 0–5)
HEMATOCRIT: 39.4 % (ref 36.0–46.0)
HEMOGLOBIN: 12.7 g/dL (ref 12.0–15.0)
LYMPHS ABS: 0.6 10*3/uL — AB (ref 0.7–4.0)
LYMPHS PCT: 8 % — AB (ref 12–46)
MCH: 36.3 pg — AB (ref 26.0–34.0)
MCHC: 32.2 g/dL (ref 30.0–36.0)
MCV: 112.6 fL — ABNORMAL HIGH (ref 78.0–100.0)
Monocytes Absolute: 0.2 10*3/uL (ref 0.1–1.0)
Monocytes Relative: 3 % (ref 3–12)
Neutro Abs: 7.1 10*3/uL (ref 1.7–7.7)
Neutrophils Relative %: 89 % — ABNORMAL HIGH (ref 43–77)
Platelets: 306 10*3/uL (ref 150–400)
RBC: 3.5 MIL/uL — ABNORMAL LOW (ref 3.87–5.11)
RDW: 14.2 % (ref 11.5–15.5)
WBC: 7.9 10*3/uL (ref 4.0–10.5)

## 2013-07-26 LAB — BASIC METABOLIC PANEL
BUN: 23 mg/dL (ref 6–23)
CALCIUM: 10 mg/dL (ref 8.4–10.5)
CHLORIDE: 104 meq/L (ref 96–112)
CO2: 23 meq/L (ref 19–32)
Creatinine, Ser: 0.84 mg/dL (ref 0.50–1.10)
GFR calc Af Amer: 69 mL/min — ABNORMAL LOW (ref >90)
GFR calc non Af Amer: 59 mL/min — ABNORMAL LOW (ref >90)
Glucose, Bld: 93 mg/dL (ref 70–99)
Potassium: 4 mEq/L (ref 3.7–5.3)
Sodium: 144 mEq/L (ref 137–147)

## 2013-07-26 LAB — I-STAT TROPONIN, ED: Troponin i, poc: 0 ng/mL (ref 0.00–0.08)

## 2013-07-26 SURGERY — EGD (ESOPHAGOGASTRODUODENOSCOPY)
Anesthesia: Moderate Sedation

## 2013-07-26 MED ORDER — SODIUM CHLORIDE 0.9 % IV SOLN
INTRAVENOUS | Status: DC
Start: 2013-07-26 — End: 2013-07-27

## 2013-07-26 MED ORDER — FENTANYL CITRATE 0.05 MG/ML IJ SOLN
INTRAMUSCULAR | Status: DC | PRN
  Administered 2013-07-26: 25 ug via INTRAVENOUS

## 2013-07-26 MED ORDER — BUTAMBEN-TETRACAINE-BENZOCAINE 2-2-14 % EX AERO
INHALATION_SPRAY | CUTANEOUS | Status: DC | PRN
  Administered 2013-07-26: 1 via TOPICAL

## 2013-07-26 MED ORDER — MIDAZOLAM HCL 5 MG/ML IJ SOLN
INTRAMUSCULAR | Status: AC
  Filled 2013-07-26: qty 1

## 2013-07-26 MED ORDER — ASPIRIN 81 MG PO CHEW: 324.0000 mg | CHEWABLE_TABLET | Freq: Once | ORAL | Status: DC

## 2013-07-26 MED ORDER — MORPHINE SULFATE 2 MG/ML IJ SOLN
2.0000 mg | Freq: Once | INTRAMUSCULAR | Status: AC
  Administered 2013-07-26: 2 mg via INTRAVENOUS
  Filled 2013-07-26: qty 1

## 2013-07-26 MED ORDER — ASPIRIN 300 MG RE SUPP
300.0000 mg | Freq: Once | RECTAL | Status: AC
  Administered 2013-07-26: 300 mg via RECTAL
  Filled 2013-07-26: qty 1

## 2013-07-26 MED ORDER — MIDAZOLAM HCL 10 MG/2ML IJ SOLN
INTRAMUSCULAR | Status: DC | PRN
  Administered 2013-07-26: 2 mg via INTRAVENOUS

## 2013-07-26 MED ORDER — FENTANYL CITRATE 0.05 MG/ML IJ SOLN
INTRAMUSCULAR | Status: AC
  Filled 2013-07-26: qty 2

## 2013-07-26 MED ORDER — IOHEXOL 350 MG/ML SOLN
100.0000 mL | Freq: Once | INTRAVENOUS | Status: AC | PRN
  Administered 2013-07-26: 100 mL via INTRAVENOUS

## 2013-07-26 NOTE — Op Note (Signed)
[facility] [place], [id number]   ENDOSCOPY PROCEDURE REPORT  PATIENT: Sherri Crawford, Sherri Crawford  MR#: [id number] BIRTHDATE: June 18, 1920 , 89  yrs. old GENDER: Female ENDOSCOPIST: Eustace Quail, MD REFERRED BY:  Austin Miles, M.D. PROCEDURE DATE:  07/26/2013 PROCEDURE:  EGD w/ foreign body removal and Balloon dilation of esophagus ASA CLASS:     Class III INDICATIONS:  Dysphagia.   abnormal CT of the GI tract. MEDICATIONS: Fentanyl 25 mcg IV and Versed 2 mg IV TOPICAL ANESTHETIC: Cetacaine Spray  DESCRIPTION OF PROCEDURE: After the risks benefits and alternatives of the procedure were thoroughly explained, informed consent was obtained.  The Pentax Gastroscope [id number] endoscope was introduced through the mouth and advanced to the second portion of the duodenum. Without limitations.  The instrument was slowly withdrawn as the mucosa was fully examined.    EXAM: The esophagus was dilated, atonic, and filled with food and fluid.  The proximal half of the esophagus revealed obvious Candida esophagitis.  Fluid was suctioned and the food was gently advanced into the stomach.  There was "popping" resistance at the GE junction.  The stomach revealed retained fluid and benign polyps. The duodenum revealed post bulbar diverticula but was otherwise normal.  Retroflexed views revealed no abnormalities. THERAPY: A TTS balloon was used to dilate the gastroesophageal junction to 15 mm. No significant trauma though submucosal friability    The scope was then withdrawn and the procedure completed.  COMPLICATIONS: There were no complications. ENDOSCOPIC IMPRESSION: 1. Esophageal dysmotility. Suspect achalasia.. Dilation at the GE junction to 15 mm 2. Candida esophagitis.  RECOMMENDATIONS: 1. Clear liquids until  Monday morning, then soft foods only thereafter. 2. Resume Xarelto tomorrow. Other medications, okay to resume today 3. Recommend Diflucan  100 mg daily for 5 days. You can begin this on Monday. My office will prescribe 4. Please make an office appointment to see Dr. Hilarie Fredrickson or one of our extenders this week. May need manometry  REPEAT EXAM:  eSigned:  Eustace Quail, MD 07/26/2013 4:26 PM   TD:SKAJGO, Ulice Dash MD and The Patient

## 2013-07-26 NOTE — Discharge Instructions (Signed)
Conscious Sedation, Adult, Care After °Refer to this sheet in the next few weeks. These instructions provide you with information on caring for yourself after your procedure. Your health care provider may also give you more specific instructions. Your treatment has been planned according to current medical practices, but problems sometimes occur. Call your health care provider if you have any problems or questions after your procedure. °WHAT TO EXPECT AFTER THE PROCEDURE  °After your procedure: °· You may feel sleepy, clumsy, and have poor balance for several hours. °· Vomiting may occur if you eat too soon after the procedure. °HOME CARE INSTRUCTIONS °· Do not participate in any activities where you could become injured for at least 24 hours. Do not: °· Drive. °· Swim. °· Ride a bicycle. °· Operate heavy machinery. °· Cook. °· Use power tools. °· Climb ladders. °· Work from a high place. °· Do not make important decisions or sign legal documents until you are improved. °· If you vomit, drink water, juice, or soup when you can drink without vomiting. Make sure you have little or no nausea before eating solid foods. °· Only take over-the-counter or prescription medicines for pain, discomfort, or fever as directed by your health care provider. °· Make sure you and your family fully understand everything about the medicines given to you, including what side effects may occur. °· You should not drink alcohol, take sleeping pills, or take medicines that cause drowsiness for at least 24 hours. °· If you smoke, do not smoke without supervision. °· If you are feeling better, you may resume normal activities 24 hours after you were sedated. °· Keep all appointments with your health care provider. °SEEK MEDICAL CARE IF: °· Your skin is pale or bluish in color. °· You continue to feel nauseous or vomit. °· Your pain is getting worse and is not helped by medicine. °· You have bleeding or swelling. °· You are still sleepy or  feeling clumsy after 24 hours. °SEEK IMMEDIATE MEDICAL CARE IF: °· You develop a rash. °· You have difficulty breathing. °· You develop any type of allergic problem. °· You have a fever. °MAKE SURE YOU: °· Understand these instructions. °· Will watch your condition. °· Will get help right away if you are not doing well or get worse. °Document Released: 12/04/2012 Document Reviewed: 09/20/2012 °[employer]® Patient Information ©2014 [employer]. ° °

## 2013-07-26 NOTE — Consult Note (Signed)
HISTORY OF PRESENT ILLNESS:  Sherri Crawford is a 80 y.o. female with multiple medical problems as listed below. Consultation requested by the emergency room physician for dysphagia and food impaction. She presented with chest pain. Cardiac etiology is ruled out. CT of the chest revealed dilated esophagus with food and fluid. She has had chronic problems with dysphagia felt to be related to motility disorder. Last endoscoped by Dr. Herb Grays September 2014. Balloon dilation to 15 mm. More problems with swallowing over the past 5 days. Has been on Xarelto for DVT, but has not taken (confirmed with nursing home nurse) in 3 days. No additional complaints. Unfortunately, her husband passed away this morning. Multiple family members present  REVIEW OF SYSTEMS:  All non-GI ROS negative except for visual difficulty, back pain, arthritis, memory trouble, depression,  Past Medical History  Diagnosis Date  . Macular degeneration   . COPD (chronic obstructive pulmonary disease)   . Hypertension   . Coronary artery disease   . Hypercholesterolemia   . GERD (gastroesophageal reflux disease)   . Asthmatic bronchitis   . History of colonic polyps   . DJD (degenerative joint disease)   . Lumbar back pain   . Osteoporosis   . History of transient ischemic attack (TIA)   . Anxiety   . Essential thrombocythemia   . Diverticulosis 05/10/2012  . Osteoarthrosis, unspecified whether generalized or localized, unspecified site 05/09/2012  . Memory loss 05/09/2012  . Abnormal weight gain 05/09/2012  . Phlebitis and thrombophlebitis of other deep vessels of lower extremities 07/10/2010  . Essential thrombocythemia 06/09/2009  . Depression     Past Surgical History  Procedure Laterality Date  . Appendectomy    . Cholecystectomy    . Tah and bso  08/21/1995  . Cataract extraction w/ intraocular lens  implant, bilateral    . Abdominal hysterectomy    . Hip arthroplasty Right 09/09/2012    Procedure:  ARTHROPLASTY BIPOLAR HIP;  Surgeon: Mauri Pole, MD;  Location: [facility];  Service: Orthopedics;  Laterality: Right;  . Esophagogastroduodenoscopy (egd) with esophageal dilation N/A 11/25/2012    Procedure: ESOPHAGOGASTRODUODENOSCOPY (EGD) WITH ESOPHAGEAL DILATION;  Surgeon: Jerene Bears, MD;  Location: [facility];  Service: Gastroenterology;  Laterality: N/A;    Social History Sherri Crawford  reports that she has never smoked. She has never used smokeless tobacco. She reports that she does not drink alcohol or use illicit drugs.  family history includes Cancer in her maternal aunt; Colon cancer (age of onset: 48) in her son; Crohn's disease in her son.  Allergies  Allergen Reactions  . Sulfonamide Derivatives Other (See Comments)    Pt is unsure of reaction       PHYSICAL EXAMINATION: Vital signs: BP 152/70  Pulse 98  Temp(Src) 98.8 F (37.1 C) (Oral)  Resp 27  SpO2 96% General: Elderly female, thin, well-nourished, no acute distress HEENT: Sclerae are anicteric, conjunctiva pink. Oral mucosa intact Chest: No crepitus Lungs: Clear Heart: Regular with occasional irregular beat Abdomen: soft, nontender, nondistended, no obvious ascites, no peritoneal signs, normal bowel sounds. No organomegaly. Extremities: No edema Psychiatric: alert and oriented x3. Cooperative   ASSESSMENT:  #1. Dysphagia. Chronic. Motility disorder #2. Probable food impaction superimposed #3. Multiple comorbidities. Has been off Xarelto for 3 days   PLAN:  #1. Upper endoscopy with probable esophageal dilation.The nature of the procedure, as well as the risks, benefits, and alternatives were carefully and thoroughly reviewed with the patient, and her family. Ample  time for discussion and questions allowed. The patient understood, was satisfied, and agreed to proceed. The patient is high-risk given her age and comorbidities. #2. May benefit from esophageal manometry if not previously performed. If she  has achalasia, Botox injection may be somewhat helpful in this 80 year old with multiple comorbidities. We will get her to followup with Dr. Hilarie Fredrickson in the near future

## 2013-07-26 NOTE — ED Notes (Signed)
Pt family states she has not eaten much at all and even has a hard time swallowing pills. She has not had an appetite.

## 2013-07-26 NOTE — ED Notes (Signed)
Endo RN at bedside to transport pt to Endo. Pt may possibly be discharged from endo. Pt and family aware of all of this and agreeable. Pt is stable for transport.

## 2013-07-26 NOTE — ED Notes (Signed)
Pt is in CT

## 2013-07-26 NOTE — ED Provider Notes (Signed)
CSN: [id number]     Arrival date & time 07/26/13  1014 History   First MD Initiated Contact with Patient 07/26/13 1018     Chief Complaint  Patient presents with  . Chest Pain    "severe, heart feels tight"     (Consider location/radiation/quality/duration/timing/severity/associated sxs/prior Treatment) HPI Comments: Patients with no previous cardiac history, history of hypertension, asthma, DVT on Xarelto, dementia -- presents with complaint of chest pain. Majority of history given by family at bedside. Chest pain began last night in the middle of the chest and has been constant. It did not radiate. It was not associated with shortness of breath, diaphoresis. It is worse with swallowing and patient has been vomiting after eating for several days. She has had similar pain in the past. Nitroglycerin given this morning as well as Ativan and Phenergan. Patient had an episode of vomiting after receiving this medication. Patient also complains of her chronic back pain in the middle of her back. EMS was called to transport to the hospital. The onset of this condition was acute. The course is constant. Aggravating factors: none. Alleviating factors: none. Of note, patient's husband passed early this morning.  The history is provided by the patient and a relative.    Past Medical History  Diagnosis Date  . Macular degeneration   . COPD (chronic obstructive pulmonary disease)   . Hypertension   . Coronary artery disease   . Hypercholesterolemia   . GERD (gastroesophageal reflux disease)   . Asthmatic bronchitis   . History of colonic polyps   . DJD (degenerative joint disease)   . Lumbar back pain   . Osteoporosis   . History of transient ischemic attack (TIA)   . Anxiety   . Essential thrombocythemia   . Diverticulosis 05/10/2012  . Osteoarthrosis, unspecified whether generalized or localized, unspecified site 05/09/2012  . Memory loss 05/09/2012  . Abnormal weight gain 05/09/2012  .  Phlebitis and thrombophlebitis of other deep vessels of lower extremities 07/10/2010  . Essential thrombocythemia 06/09/2009  . Depression    Past Surgical History  Procedure Laterality Date  . Appendectomy    . Cholecystectomy    . Tah and bso  08/21/1995  . Cataract extraction w/ intraocular lens  implant, bilateral    . Abdominal hysterectomy    . Hip arthroplasty Right 09/09/2012    Procedure: ARTHROPLASTY BIPOLAR HIP;  Surgeon: Mauri Pole, MD;  Location: [facility];  Service: Orthopedics;  Laterality: Right;  . Esophagogastroduodenoscopy (egd) with esophageal dilation N/A 11/25/2012    Procedure: ESOPHAGOGASTRODUODENOSCOPY (EGD) WITH ESOPHAGEAL DILATION;  Surgeon: Jerene Bears, MD;  Location: [facility];  Service: Gastroenterology;  Laterality: N/A;   Family History  Problem Relation Age of Onset  . Colon cancer Son 71  . Crohn's disease Son   . Cancer Maternal Aunt    History  Substance Use Topics  . Smoking status: Never Smoker   . Smokeless tobacco: Never Used  . Alcohol Use: No   OB History   Grav Para Term Preterm Abortions TAB SAB Ect Mult Living                 Review of Systems  Constitutional: Negative for fever and diaphoresis.  Eyes: Negative for redness.  Respiratory: Negative for cough and shortness of breath.   Cardiovascular: Positive for chest pain. Negative for palpitations and leg swelling.  Gastrointestinal: Negative for nausea, vomiting and abdominal pain.  Genitourinary: Negative for dysuria.  Musculoskeletal: Positive for back  pain. Negative for neck pain.  Skin: Negative for rash.  Neurological: Negative for syncope and light-headedness.   Allergies  Sulfonamide derivatives  Home Medications   Prior to Admission medications   Medication Sig Start Date End Date Taking? Authorizing Provider  albuterol (PROAIR HFA) 108 (90 BASE) MCG/ACT inhaler Inhale 2 puffs into the lungs every 6 (six) hours as needed for wheezing. As needed for wheezing  11/16/10   Noralee Space, MD  amoxicillin (AMOXIL) 500 MG capsule  05/20/13   Historical Provider, MD  atorvastatin (LIPITOR) 20 MG tablet Take 20 mg by mouth every evening.  10/25/11   Noralee Space, MD  cholecalciferol (VITAMIN D) 1000 UNITS tablet Take 1,000 Units by mouth every morning.    Historical Provider, MD  feeding supplement (RESOURCE BREEZE) LIQD Take 1 Container by mouth 4 (four) times daily.    Historical Provider, MD  Fluticasone-Salmeterol (ADVAIR) 250-50 MCG/DOSE AEPB Inhale 1 puff into the lungs every 12 (twelve) hours.    Historical Provider, MD  HYDROcodone-acetaminophen [facility]) 10-325 MG per tablet Take one tablet by mouth four times daily for pain 04/28/13   Tiffany L Reed, DO  hydroxyurea (HYDREA) 500 MG capsule Take 500 mg by mouth See admin instructions. Take 2 capsules on Monday. Take 1 capsule every other day of the week (Wed, Fri, Sun) 04/27/11   Annia Belt, MD  memantine (NAMENDA) 5 MG tablet Take 1 tablet (5 mg total) by mouth 2 (two) times daily. 10/30/12   Blanchie Serve, MD  metoprolol tartrate (LOPRESSOR) 25 MG tablet Take 25 mg by mouth 2 (two) times daily.    Historical Provider, MD  montelukast (SINGULAIR) 10 MG tablet Take 10 mg by mouth at bedtime.    Historical Provider, MD  Multiple Vitamins-Minerals (CERTAVITE/ANTIOXIDANTS PO) Take 1 capsule by mouth every morning.    Historical Provider, MD  nitroGLYCERIN (NITROSTAT) 0.4 MG SL tablet Place 0.4 mg under the tongue every 5 (five) minutes as needed for chest pain.     Historical Provider, MD  omeprazole (PRILOSEC) 20 MG capsule Take 20 mg by mouth every morning.  05/15/12   Historical Provider, MD  oxyCODONE (OXY IR/ROXICODONE) 5 MG immediate release tablet Take 5 mg by mouth every 4 (four) hours as needed for severe pain.    Historical Provider, MD  Rivaroxaban (XARELTO) 15 MG TABS tablet Take 15 mg by mouth daily.    Historical Provider, MD  sertraline (ZOLOFT) 50 MG tablet Take 50 mg by mouth daily.     Historical Provider, MD   BP 147/70  Pulse 79  Temp(Src) 98.7 F (37.1 C) (Oral)  Resp 16  SpO2 98%  Physical Exam  Nursing note and vitals reviewed. Constitutional: She appears well-developed and well-nourished.  HENT:  Head: Normocephalic and atraumatic.  Mouth/Throat: Oropharynx is clear and moist and mucous membranes are normal. Mucous membranes are not dry.  Eyes: Conjunctivae are normal.  Neck: Trachea normal and normal range of motion. Neck supple. Normal carotid pulses and no JVD present. No muscular tenderness present. Carotid bruit is not present. No tracheal deviation present.  Cardiovascular: Normal rate, regular rhythm, S1 normal, S2 normal, normal heart sounds and intact distal pulses.  Exam reveals no decreased pulses.   No murmur heard. Pulmonary/Chest: Effort normal. No respiratory distress. She has no wheezes. She exhibits no tenderness.  Abdominal: Soft. Normal aorta and bowel sounds are normal. There is no tenderness. There is no rebound and no guarding.  Musculoskeletal: She exhibits no edema  and no tenderness.  No lower extremity edema.  Neurological: She is alert.  Skin: Skin is warm and dry. She is not diaphoretic. No cyanosis. No pallor.  Psychiatric: She has a normal mood and affect.    ED Course  Procedures (including critical care time) Labs Review Labs Reviewed - No data to display  Imaging Review Dg Chest 2 View  07/26/2013   CLINICAL DATA:  Chest pain  EXAM: CHEST  2 VIEW  COMPARISON:  09/08/2012  FINDINGS: The patient is rotated. Calcified atherosclerotic disease involves the thoracic aorta. The heart size and mediastinal contours are within normal limits. Both lungs are clear. Compression deformities are noted within the mid and lower thoracic spine. These appear slightly progressive when compared with 07/02/2010  IMPRESSION: 1. No acute cardiopulmonary abnormalities. 2. Atherosclerosis 3. Progression of thoracic compression fractures.    Electronically Signed   By: Kerby Moors M.D.   On: 07/26/2013 12:16   Ct Angio Chest Pe W/cm &/or Wo Cm  07/26/2013   CLINICAL DATA:  Shortness of breath and chest pain  EXAM: CT ANGIOGRAPHY CHEST WITH CONTRAST  TECHNIQUE: Multidetector CT imaging of the chest was performed using the standard protocol during bolus administration of intravenous contrast. Multiplanar CT image reconstructions and MIPs were obtained to evaluate the vascular anatomy.  CONTRAST:  49mL OMNIPAQUE IOHEXOL 350 MG/ML SOLN  COMPARISON:  None.  FINDINGS: The lungs are well aerated bilaterally. Minimal atelectatic changes are noted in the bases bilaterally. No focal confluent infiltrate is seen. The hilar and mediastinal structures show no significant lymphadenopathy. The esophagus is significantly dilated with fluid and apparent food stuffs raising suspicion for possible distal achalasia. These changes extend to the level of the thoracic inlet. The thoracic aorta and its branches are within normal limits without aneurysmal dilatation or dissection. Diffuse atherosclerotic calcifications are seen. Heavy coronary calcifications are noted as well. The pulmonary artery is well visualized and shows a normal branching pattern. No definitive filling defects to suggest pulmonary emboli are noted. Small calcified hilar lymph nodes are seen consistent with prior granulomatous disease.  The upper abdomen shows no acute abnormality. Heavy vascular calcifications are noted in the proximal branches of the abdominal aorta. Compression deformities of T9 and T11 are noted.  Review of the MIP images confirms the above findings.  IMPRESSION: No evidence of acute pulmonary emboli.  Dilated esophagus with both fluid and food stuffs within. This raises suspicion for achalasia distally.  Progressive T9 and T11 compression deformities.  Mild bibasilar atelectasis.   Electronically Signed   By: Inez Catalina M.D.   On: 07/26/2013 13:15     EKG  Interpretation   Date/Time:  Saturday Jul 26 2013 10:17:43 EDT Ventricular Rate:  80 PR Interval:  149 QRS Duration: 82 QT Interval:  388 QTC Calculation: 448 R Axis:   24 Text Interpretation:  Sinus rhythm Minimal ST depression, lateral leads  Confirmed by WARD,  DO, KRISTEN ([id number]) on 07/26/2013 10:25:02 AM      10:48 AM Patient seen and examined. Work-up initiated. Medications ordered. EKG reviewed.  Vital signs reviewed and are as follows: Filed Vitals:   07/26/13 1015  BP: 147/70  Pulse: 79  Temp: 98.7 F (37.1 C)  Resp: 16   1:35 PM Patient seen by Dr. Leonides Schanz earlier. CT ordered.   CT shows significantly dilated esophagus with food and fluid. Will call GI. Patient was not able to drink fluids here. She regurgitates immediately and has increased pain.   2:15 PM  Last Xarelto was dose yesterday afternoon. Son to call to ask if she tolerated this. Patient cannot remember. Patient is spitting out some secretions at bedside, but is not in distress.   Spoke with Dr. Henrene Pastor who will perform EGD.  3:32 PM Patient to EGD.    MDM   Final diagnoses:  Esophageal obstruction due to food impaction  Chest pain   Patient with CP, cardiac work-up unrevealing. Chest CT demonstrates impaction/esophageal dilation. GI to perform EGD.     Carlisle Cater, PA-C 07/26/13 [phone]

## 2013-07-26 NOTE — Progress Notes (Signed)
0.9% saline hung to infuse during procedure 300 cc  Infused during procedure & recovery Iv Dc ed at 710

## 2013-07-26 NOTE — ED Notes (Signed)
From assistive/independent living ([facility] [place]): according to staff, patient's husband passed away, cp lasted possibly 24 hours, history of dementia.  Chest Pain anterior, no radiating, constant..  Seems alert and oriented.  History of hiatal hernia and GERD, hx of COPD, CAD, and asthma, lipidemia, hypertension. On xerelto and is anemic as well.  given 0.5mg  ativan 0630 today and 2 NTG pta by staff, Phenergan po given by staff, vomited it up.   given 2 NTG en route by EMS, no relief, 350 systolic prior to NTG, 86 systolic post NTG. 4 mg zofran given as well.   12 lead unremarkable, nothing acute noted per ems.   Family in at bedside, daughter POA. There is a living will in place per ems, documents at house.

## 2013-07-26 NOTE — ED Notes (Signed)
Gave pt water 

## 2013-07-26 NOTE — ED Notes (Signed)
Notified PA that pt's BP has increased to 184/85. Will continue to monitor. No further orders at this time

## 2013-07-26 NOTE — ED Provider Notes (Signed)
Medical screening examination/treatment/procedure(s) were conducted as a shared visit with non-physician practitioner(s) and myself.  I personally evaluated the patient during the encounter.   EKG Interpretation   Date/Time:  Saturday Jul 26 2013 10:17:43 EDT Ventricular Rate:  80 PR Interval:  149 QRS Duration: 82 QT Interval:  388 QTC Calculation: 448 R Axis:   24 Text Interpretation:  Sinus rhythm Minimal ST depression, lateral leads  Confirmed by Sanaii Caporaso,  DO, [place] Name ([id number]) on 07/26/2013 10:25:02 AM      Pt is a 80 y.o. F with history of COPD, hypertension, TIA, dementia who presents from her assisted-living facility with chest pain that started yesterday afternoon it has been constant. It is associated with shortness of breath and nausea. She is unable to describe her pain to be her to me if there are any aggravating or alleviating factors. She does have a history of lower extremity DVT and is currently on Xarelto. Her EKG shows no new ischemic changes and her troponin is negative. CT scan of her chest shows no sign of pulmonary embolus but there is distal esophageal food impaction. We'll review of her records, appears patient has had esophageal dilations in the past.  Dr. Henrene Pastor to perform endoscopy.  [place], DO 07/26/13 1415

## 2013-07-28 ENCOUNTER — Encounter (HOSPITAL_COMMUNITY): Payer: Self-pay | Admitting: Internal Medicine

## 2013-07-28 ENCOUNTER — Telehealth: Payer: Self-pay | Admitting: Internal Medicine

## 2013-07-28 ENCOUNTER — Non-Acute Institutional Stay (SKILLED_NURSING_FACILITY): Payer: Medicare Other | Admitting: Nurse Practitioner

## 2013-07-28 DIAGNOSIS — Z9889 Other specified postprocedural states: Secondary | ICD-10-CM

## 2013-07-28 DIAGNOSIS — I10 Essential (primary) hypertension: Secondary | ICD-10-CM

## 2013-07-28 DIAGNOSIS — F0390 Unspecified dementia without behavioral disturbance: Secondary | ICD-10-CM

## 2013-07-28 DIAGNOSIS — I82402 Acute embolism and thrombosis of unspecified deep veins of left lower extremity: Secondary | ICD-10-CM

## 2013-07-28 DIAGNOSIS — B3781 Candidal esophagitis: Secondary | ICD-10-CM

## 2013-07-28 DIAGNOSIS — F329 Major depressive disorder, single episode, unspecified: Secondary | ICD-10-CM

## 2013-07-28 DIAGNOSIS — F419 Anxiety disorder, unspecified: Secondary | ICD-10-CM

## 2013-07-28 DIAGNOSIS — J4489 Other specified chronic obstructive pulmonary disease: Secondary | ICD-10-CM

## 2013-07-28 DIAGNOSIS — J449 Chronic obstructive pulmonary disease, unspecified: Secondary | ICD-10-CM

## 2013-07-28 DIAGNOSIS — F341 Dysthymic disorder: Secondary | ICD-10-CM

## 2013-07-28 DIAGNOSIS — M545 Low back pain, unspecified: Secondary | ICD-10-CM

## 2013-07-28 DIAGNOSIS — F32A Depression, unspecified: Secondary | ICD-10-CM

## 2013-07-28 DIAGNOSIS — I82409 Acute embolism and thrombosis of unspecified deep veins of unspecified lower extremity: Secondary | ICD-10-CM

## 2013-07-28 DIAGNOSIS — D473 Essential (hemorrhagic) thrombocythemia: Secondary | ICD-10-CM

## 2013-07-28 HISTORY — DX: Other specified postprocedural states: Z98.890

## 2013-07-28 NOTE — Assessment & Plan Note (Signed)
F/u Hematology, takes Hydrea 1000mg  Monday, 500mg  Wed, Fri, Sun. Plt 413 12/05/12 and 264 05/15/13.Chronic anticoagulation with Xarelto. 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

## 2013-07-28 NOTE — Assessment & Plan Note (Signed)
Switched to [place] since 09/12/12. Left   

## 2013-07-28 NOTE — Assessment & Plan Note (Signed)
Controlled on Metoprolol 25mg bid.    

## 2013-07-28 NOTE — Progress Notes (Signed)
Patent ID: Sherri Crawford, female   DOB: [birth year], 80 y.o.   MRN: [id number]    Patient ID: Sherri Crawford, female   DOB: 18-Feb-1921, 80 y.o.   MRN: [id number]  Code Status: DNR  Allergies  Allergen Reactions  . Sulfonamide Derivatives Other (See Comments)    Pt is unsure of reaction    Chief Complaint  Patient presents with  . Medical Management of Chronic Issues  . Acute Visit    s/p EGD foreign body removal and Balloon dilation of esophagus.     HPI: Patient is a 80 y.o. female seen in the SNF at [facility] today for evaluation of ED 07/26/13 for EGD foreign body removal/Balloon dilation of esophagus and chronic  medical conditions.   07/26/13 the patient presented to ED: Chest pain began last night in the middle of the chest and has been constant. It did not radiate. It was not associated with shortness of breath, diaphoresis. It is worse with swallowing and patient has been vomiting after eating for several days. She has had similar pain in the past. Didn't respond to Nitroglycerin, Ativan,  and Phenergan. Patient had an episode of vomiting after receiving this medication. Patient also complains of her chronic back pain in the middle of her back.   Her EKG shows no new ischemic changes and her troponin is negative. CT scan of her chest shows no sign of pulmonary embolus but there is distal esophageal food impaction  No further chest pain, nausea, or vomiting since s/p EGD foreign body removal and Balloon of esophagus dilation. Also she was found to have candida esophagitis-will start Diflucan 100mg  daily x5 today.  Problem List Items Addressed This Visit   Anxiety and depression     Worsened. Since husband expired. Will continue Sertraline 50mg  daily and prn Lorazepam.      BACK PAIN, LUMBAR     Hx of lower back pain, had X-ray of her lumbar spine 09/08/12--spondylosis-better controlled with Hydrocodone/ApAp  10/325mg  ac and hs  01/01/13 lumbar spine inj. Able to  ambulate with walker on unit until her fall 06/22/13. 06/25/13 unremarkable. X-ray lumbar spine. continue Oxycodone 5mg  q4hr prn for better pain control. F/u [name].       Candida esophagitis     Complete 5 days Diflucan 100mg  daily. Observe the patient. Continue Omeprazole.     COPD with asthma     Managed with Fluticasone-Salmeterol 250/50 bid, dc'd Epipen prn and Immunotherapy per [facility] 01/17/13, Montelukast 10mg  daily, and Albuterol II puffs q6hr prn. Stable.       Deep vein thrombosis of left lower extremity     Switched to Xeralto since 09/12/12. Left       Dementia     Presently Namenda daily and SNF for care needs      Essential hypertension     Controlled on Metoprolol 25mg  bid.      Essential thrombocythemia     F/u Hematology, takes Hydrea 1000mg  Monday, 500mg  Wed, Fri, Sun. Plt 413 12/05/12 and 264 05/15/13.Chronic anticoagulation with Xarelto. Uric acid 3.6 05/15/13(monitor for hyperuricemia related to Hydrea- tumor lysis)      S/P balloon dilatation of esophageal stricture - Primary     Resolved chest pain, nausea, and vomiting.        Review of Systems:  Review of Systems  Constitutional: Negative for fever, chills, weight loss, malaise/fatigue and diaphoresis.  HENT: Positive for hearing loss. Negative for congestion, ear discharge, ear pain, nosebleeds, sore throat  and tinnitus.   Eyes: Negative for blurred vision, double vision, photophobia, pain, discharge and redness.  Respiratory: Positive for cough. Negative for hemoptysis, sputum production, shortness of breath, wheezing (occasional. ) and stridor.   Cardiovascular: Negative for chest pain, palpitations, orthopnea, claudication and PND. Leg swelling: trace RLE-resolved.   Gastrointestinal: Positive for vomiting. Negative for heartburn, nausea, abdominal pain, diarrhea, constipation, blood in stool and melena.       Dysphagia.   Genitourinary: Positive for frequency. Negative for dysuria, urgency,  hematuria and flank pain.  Musculoskeletal: Positive for falls (recent and resulted in the right hip fx. ) and joint pain (right hip). Negative for myalgias and neck pain. Back pain: chronic back pain is managed with Norco.       Ambulates with walker. Hx of lower back pain: had multiple inj and presently taking Norco 5xday. Worsened lower back pain since her fall 06/22/13-no longer able to ambulate with walker today.   Skin: Negative for itching and rash.       Itching scaly mid of R+L shins.   Neurological: Negative for dizziness, tingling, tremors, sensory change, speech change, focal weakness, seizures, loss of consciousness, weakness and headaches.  Endo/Heme/Allergies: Positive for environmental allergies. Negative for polydipsia. Bruises/bleeds easily.  Psychiatric/Behavioral: Positive for depression and memory loss. Negative for suicidal ideas, hallucinations and substance abuse. The patient is nervous/anxious. The patient does not have insomnia.      Past Medical History  Diagnosis Date  . Macular degeneration   . COPD (chronic obstructive pulmonary disease)   . Hypertension   . Coronary artery disease   . Hypercholesterolemia   . GERD (gastroesophageal reflux disease)   . Asthmatic bronchitis   . History of colonic polyps   . DJD (degenerative joint disease)   . Lumbar back pain   . Osteoporosis   . History of transient ischemic attack (TIA)   . Anxiety   . Essential thrombocythemia   . Diverticulosis 05/10/2012  . Osteoarthrosis, unspecified whether generalized or localized, unspecified site 05/09/2012  . Memory loss 05/09/2012  . Abnormal weight gain 05/09/2012  . Phlebitis and thrombophlebitis of other deep vessels of lower extremities 07/10/2010  . Essential thrombocythemia 06/09/2009  . Depression    Past Surgical History  Procedure Laterality Date  . Appendectomy    . Cholecystectomy    . [place] and bso  08/21/1995  . Cataract extraction w/ intraocular lens  implant,  bilateral    . Abdominal hysterectomy    . Hip arthroplasty Right 09/09/2012    Procedure: ARTHROPLASTY BIPOLAR HIP;  Surgeon: Mauri Pole, MD;  Location: [facility] ORS;  Service: Orthopedics;  Laterality: Right;  . Esophagogastroduodenoscopy (egd) with esophageal dilation N/A 11/25/2012    Procedure: ESOPHAGOGASTRODUODENOSCOPY (EGD) WITH ESOPHAGEAL DILATION;  Surgeon: Jerene Bears, MD;  Location: [facility] ENDOSCOPY;  Service: Gastroenterology;  Laterality: N/A;  . Esophagogastroduodenoscopy N/A 07/26/2013    Procedure: ESOPHAGOGASTRODUODENOSCOPY (EGD);  Surgeon: Irene Shipper, MD;  Location: [facility] END[facility]COPY;  Service: Endoscopy;  Laterality: N/A;   Social History:   reports that she has never smoked. She has never used smokeless tobacco. She reports that she does not drink alcohol or use illicit drugs.  Family History  Problem Relation Age of Onset  . Colon cancer Son 66  . Crohn's disease Son   . Cancer Maternal Aunt     Medications: Patient's Medications  New Prescriptions   No medications on file  Previous Medications   ALBUTEROL (PROAIR HFA) 108 (90 BASE) MCG/ACT INHALER  Inhale 2 puffs into the lungs every 6 (six) hours as needed for wheezing. As needed for wheezing   ATORVASTATIN (LIPITOR) 20 MG TABLET    Take 20 mg by mouth every evening.    CHOLECALCIFEROL (VITAMIN D) 1000 UNITS TABLET    Take 1,000 Units by mouth every morning.   FEEDING SUPPLEMENT (RESOURCE BREEZE) LIQD    Take 90 mLs by mouth 4 (four) times daily.    FLUTICASONE-SALMETEROL (ADVAIR) 250-50 MCG/DOSE AEPB    Inhale 1 puff into the lungs every 12 (twelve) hours.   HYDROCODONE-ACETAMINOPHEN (NORCO) 10-325 MG PER TABLET    Take one tablet by mouth four times daily for pain   HYDROXYUREA (HYDREA) 500 MG CAPSULE    Take 500-1,000 mg by mouth See admin instructions. Take 1000 mg on Monday and 500 mg on Sunday, Wednesday and Friday   MEMANTINE HCL ER (NAMENDA XR) 14 MG CP24    Take 14 mg by mouth daily.   METOPROLOL TARTRATE  (LOPRESSOR) 25 MG TABLET    Take 25 mg by mouth 2 (two) times daily.   MONTELUKAST (SINGULAIR) 10 MG TABLET    Take 10 mg by mouth at bedtime.   MULTIPLE VITAMINS-MINERALS (CERTAVITE SENIOR/ANTIOXIDANT PO)    Take 1 tablet by mouth every morning.   NITROGLYCERIN (NITROSTAT) 0.4 MG SL TABLET    Place 0.4 mg under the tongue every 5 (five) minutes as needed for chest pain.    OMEPRAZOLE (PRILOSEC) 20 MG CAPSULE    Take 20 mg by mouth every morning.    OXYCODONE (OXY IR/ROXICODONE) 5 MG IMMEDIATE RELEASE TABLET    Take 5 mg by mouth every 4 (four) hours as needed for severe pain.   RIVAROXABAN (XARELTO) 15 MG TABS TABLET    Take 15 mg by mouth daily.   SERTRALINE (ZOLOFT) 25 MG TABLET    Take 25 mg by mouth at bedtime.  Modified Medications   No medications on file  Discontinued Medications   No medications on file     Physical Exam: Physical Exam  Constitutional: She is oriented to person, place, and time. She appears well-developed and well-nourished. No distress.  HENT:  Head: Normocephalic and atraumatic.  Eyes: Conjunctivae and EOM are normal. Pupils are equal, round, and reactive to light.  Neck: Normal range of motion. Neck supple. No JVD present. No thyromegaly present.  Cardiovascular: Normal rate, regular rhythm and normal heart sounds.   Pulmonary/Chest: Effort normal. She has decreased breath sounds. She has no wheezes. She has no rales.  Decreased breath sounds Bilat. Lungs.   Abdominal: Soft. Bowel sounds are normal. There is no tenderness.  Musculoskeletal: Normal range of motion. She exhibits tenderness (chronic lower back and the right hip since the fx. better controloled with scheduled Norco. ). She exhibits no edema.  Lower back.  Lymphadenopathy:    She has no cervical adenopathy.  Neurological: She is alert and oriented to person, place, and time. She has normal reflexes. No cranial nerve deficit. She exhibits normal muscle tone. Coordination normal.  Skin: Skin is  warm and dry. No rash noted. She is not diaphoretic. No erythema (LLE).  The right hip surgical incision healed. Itching/scaly mid shins  Psychiatric: Her mood appears anxious. Her affect is not angry, not blunt, not labile and not inappropriate. Her speech is not delayed, not tangential and not slurred. She is hyperactive and slowed. She is not agitated, not aggressive, not withdrawn and not combative. Thought content is not paranoid and not delusional.  Cognition and memory are impaired. She does not express impulsivity or inappropriate judgment. She exhibits a depressed mood. She exhibits abnormal recent memory.    Filed Vitals:   07/28/13 1426  BP: 135/75  Pulse: 90  Temp: 98 F (36.7 C)  TempSrc: Tympanic  Resp: 20      Labs reviewed: Basic Metabolic Panel:  Recent Labs  10/18/12  11/22/12 1800 11/22/12 2249 11/23/12 0435 05/15/13 05/27/13 1446 07/26/13 1131  NA  --   < > 141  --  143 141 140 144  K  --   < > 3.5  --  3.6 4.5 4.5 4.0  CL  --   --  101  --  106  --   --  104  CO2  --   < > 24  --  22  --  28 23  GLUCOSE  --   < > 83  --  69*  --  121 93  BUN  --   < > 17  --  14 21 22.8 23  CREATININE  --   < > 0.74  --  0.75 0.8 1.1 0.84  CALCIUM  --   < > 9.9  --  8.5  --  9.5 10.0  TSH 2.81  --   --  0.129*  --   --   --   --   < > = values in this interval not displayed. Liver Function Tests:  Recent Labs  11/22/12 1800 11/23/12 0435 05/15/13 05/27/13 1446  AST 23 22 16 19   ALT 12 11 10 11   ALKPHOS 81 73 47 63  BILITOT 0.5 0.5  --  0.33  PROT 7.1 6.1  --  6.5  ALBUMIN 3.7 3.0*  --  3.6   CBC:  Recent Labs  11/23/12 0435  11/26/12 0526  05/27/13 1549 06/26/13 07/26/13 1131  WBC 8.4  < > 7.1  < > 5.7 6.0 7.9  NEUTROABS 6.9  --   --   --  3.7  --  7.1  HGB 12.4  < > 11.2*  < > 12.6 11.8* 12.7  HCT 39.1  < > 35.0*  < > 37.9 34* 39.4  MCV 110.1*  < > 106.7*  --  110.1*  --  112.6*  PLT 316  < > 291  < > 332 288 306  < > = values in this interval not  displayed.  Past Procedures:  09/09/12  CT head:  IMPRESSION: No skull fracture or intracranial hemorrhage.  Small vessel disease type changes without CT evidence of large acute infarct.  X-ray right hip  IMPRESSION: Right femoral prosthesis without acute complication  X-ray Lumbar spine  IMPRESSION: Diffuse lumbar spondylosis.  No evidence of fracture.   X--ray R hip  IMPRESSION: Displaced right subcapital hip fracture.  11/25/12 GED: Esophageal dysmotility w/o definitive stricture. S/p empiric dilation in the lower third of the esophagus and across GE junction with TTS balloon to 39mm. Recommended diet as tolerated.   06/25/13 X-ray lumbar spine: disc disease, slight rotoscoliosis curvature to the left, the is a right hip prosthesis present, there is an eggshell calcification in the right upper quadrant of uncertain etiology.    Assessment/Plan S/P balloon dilatation of esophageal stricture Resolved chest pain, nausea, and vomiting.   Candida esophagitis Complete 5 days Diflucan 100mg  daily. Observe the patient. Continue Omeprazole.   Deep vein thrombosis of left lower extremity Switched to Xeralto since 09/12/12. Left     Essential thrombocythemia  F/u Hematology, takes Hydrea 1000mg  Monday, 500mg  Wed, Fri, Sun. Plt 413 12/05/12 and 264 05/15/13.Chronic anticoagulation with Xarelto. Uric acid 3.6 05/15/13(monitor for hyperuricemia related to Hydrea- tumor lysis)    Dementia Presently Namenda daily and SNF for care needs    COPD with asthma Managed with Fluticasone-Salmeterol 250/50 bid, dc'd Epipen prn and Immunotherapy per [facility] 01/17/13, Montelukast 10mg  daily, and Albuterol II puffs q6hr prn. Stable.     Essential hypertension Controlled on Metoprolol 25mg  bid.    Anxiety and depression Worsened. Since husband expired. Will continue Sertraline 50mg  daily and prn Lorazepam.    BACK PAIN, LUMBAR Hx of lower back pain, had X-ray of her lumbar  spine 09/08/12--spondylosis-better controlled with Hydrocodone/ApAp  10/325mg  ac and hs  01/01/13 lumbar spine inj. Able to ambulate with walker on unit until her fall 06/22/13. 06/25/13 unremarkable. X-ray lumbar spine. continue Oxycodone 5mg  q4hr prn for better pain control. F/u [name].       Family/ Staff Communication: observe the patient  Goals of Care: failed AL and returned to SNF for care.   Labs/tests ordered: none

## 2013-07-28 NOTE — Assessment & Plan Note (Addendum)
Complete 5 days Diflucan 100mg  daily. Observe the patient. Continue Omeprazole.

## 2013-07-28 NOTE — Assessment & Plan Note (Signed)
Worsened. Since husband expired. Will continue Sertraline 50mg  daily and prn Lorazepam.

## 2013-07-28 NOTE — Assessment & Plan Note (Signed)
Managed with Fluticasone-Salmeterol 250/50 bid, dc'd Epipen prn and Immunotherapy per [facility] 01/17/13, Montelukast 10mg daily, and Albuterol II puffs q6hr prn. Stable 

## 2013-07-28 NOTE — Assessment & Plan Note (Signed)
Resolved chest pain, nausea, and vomiting.

## 2013-07-28 NOTE — Assessment & Plan Note (Signed)
Presently Namenda daily and SNF for care needs

## 2013-07-28 NOTE — Telephone Encounter (Signed)
Spoke with Andee Poles and gave her OV on 07/30/13 at 1:30 PM with Nicoletta Ba, PA. She will have the health care power of attorney come with patient.

## 2013-07-28 NOTE — Assessment & Plan Note (Signed)
Hx of lower back pain, had X-ray of her lumbar spine 09/08/12--spondylosis-better controlled with Hydrocodone/ApAp  10/325mg ac and hs  01/01/13 lumbar spine inj. Able to ambulate with walker on unit until her fall 06/22/13. 06/25/13 unremarkable. X-ray lumbar spine. continue Oxycodone 5mg q4hr prn for better pain control. F/u Orth.    

## 2013-07-30 ENCOUNTER — Encounter: Payer: Self-pay | Admitting: Physician Assistant

## 2013-07-30 ENCOUNTER — Telehealth: Payer: Self-pay | Admitting: Internal Medicine

## 2013-07-30 ENCOUNTER — Ambulatory Visit (INDEPENDENT_AMBULATORY_CARE_PROVIDER_SITE_OTHER): Payer: Medicare Other | Admitting: Physician Assistant

## 2013-07-30 VITALS — BP 100/62 | HR 85 | Ht 61.0 in | Wt 99.4 lb

## 2013-07-30 DIAGNOSIS — K224 Dyskinesia of esophagus: Secondary | ICD-10-CM

## 2013-07-30 DIAGNOSIS — R1310 Dysphagia, unspecified: Secondary | ICD-10-CM

## 2013-07-30 NOTE — Progress Notes (Addendum)
Subjective:    Patient ID: Sherri Crawford, female    DOB: [birth year], 80 y.o.   MRN: [id number]  HPI Sherri Crawford is a 79 year old white female known to Dr Sherri Crawford. [facility] and comes in today with her son and daughter. Patient has been having progressive difficulty with dysphagia. She had undergone an upper endoscopy with Dr. Henrene Crawford on 07/26/2013 for removal of a food impaction. At that time she was noted to have evidence of a dilated atonic esophagus filled with food and fluid was also obvious Candida esophagitis. She was balloon dilated to 15 mm. It is felt she probably has an underlying achalasia. Patient is not a great historian but her family states that she has had progressive difficulty eating. She was having significant trouble for a least a week prior to that episode of food impaction. After the procedure she is able to eat solid food, she has had some intermittent regurgitation but this has not been occurring with every meal. She is not having any coughing or choking with meals. Unfortunately her husband had passed away that same weekend that she had the endoscopy and currently she says she has absolutely no appetite. He have been feeding her some clear and sure she's tolerating that. She denies any odynophagia but does complain frequently of a discomfort in her chest. Patient had undergone a barium swallow in 2012 which showed diffuse motility disorder with a circumferential narrowing of the distal esophagus with persistent long segment narrowing to 5 mm Patient is on  Xarelto currently, has history of coronary artery disease, COPD, history of DVT, and dementia .   Review of Systems  Constitutional: Positive for appetite change and unexpected weight change.  HENT: Negative.   Eyes: Negative.   Respiratory: Negative.   Cardiovascular: Negative.   Gastrointestinal: Negative.   Endocrine: Negative.   Genitourinary: Negative.   Musculoskeletal: Positive for arthralgias  and back pain.  Skin: Negative.   Allergic/Immunologic: Negative.   Neurological: Negative.   Hematological: Negative.   Psychiatric/Behavioral: Negative.    Outpatient Prescriptions Prior to Visit  Medication Sig Dispense Refill  . atorvastatin (LIPITOR) 20 MG tablet Take 20 mg by mouth every evening.       . cholecalciferol (VITAMIN D) 1000 UNITS tablet Take 1,000 Units by mouth every morning.      . feeding supplement (RESOURCE BREEZE) LIQD Take 90 mLs by mouth 4 (four) times daily.       . Fluticasone-Salmeterol (ADVAIR) 250-50 MCG/DOSE AEPB Inhale 1 puff into the lungs every 12 (twelve) hours.      Marland Kitchen HYDROcodone-acetaminophen (NORCO) 10-325 MG per tablet Take one tablet by mouth four times daily for pain  120 tablet  0  . hydroxyurea (HYDREA) 500 MG capsule Take 500-1,000 mg by mouth See admin instructions. Take 1000 mg on Monday and 500 mg on Sunday, Wednesday and Friday      . Memantine HCl ER (NAMENDA XR) 14 MG CP24 Take 14 mg by mouth daily.      . metoprolol tartrate (LOPRESSOR) 25 MG tablet Take 25 mg by mouth 2 (two) times daily.      . montelukast (SINGULAIR) 10 MG tablet Take 10 mg by mouth at bedtime.      . Multiple Vitamins-Minerals (CERTAVITE SENIOR/ANTIOXIDANT PO) Take 1 tablet by mouth every morning.      Marland Kitchen omeprazole (PRILOSEC) 20 MG capsule Take 20 mg by mouth every morning.       Marland Kitchen oxyCODONE (OXY  IR/ROXICODONE) 5 MG immediate release tablet Take 5 mg by mouth every 4 (four) hours as needed for severe pain.      . Rivaroxaban (XARELTO) 15 MG TABS tablet Take 15 mg by mouth daily.      . nitroGLYCERIN (NITROSTAT) 0.4 MG SL tablet Place 0.4 mg under the tongue every 5 (five) minutes as needed for chest pain.       Marland Kitchen albuterol (PROAIR HFA) 108 (90 BASE) MCG/ACT inhaler Inhale 2 puffs into the lungs every 6 (six) hours as needed for wheezing. As needed for wheezing  1 Inhaler  11  . sertraline (ZOLOFT) 25 MG tablet Take 25 mg by mouth at bedtime.       No  facility-administered medications prior to visit.   Allergies  Allergen Reactions  . Sulfonamide Derivatives Other (See Comments)    Pt is unsure of reaction   Patient Active Problem List   Diagnosis Date Noted  . S/P balloon dilatation of esophageal stricture 07/28/2013  . Candida esophagitis 07/28/2013  . Esophageal dysmotility 07/26/2013  . Foreign body in esophagus 07/26/2013  . Nonspecific (abnormal) findings on radiological and other examination of gastrointestinal tract 07/26/2013  . Fall 06/25/2013  . Protein-calorie malnutrition, severe 11/24/2012  . Dry skin dermatitis 11/18/2012  . Dementia 09/16/2012  . Hip fracture 09/08/2012  . Deep vein thrombosis of left lower extremity 07/19/2012  . Anxiety and depression 03/12/2012  . Presbyesophagus 10/25/2010  . DVT (deep venous thrombosis) 07/13/2010  . Edema of leg 07/01/2010  . Left leg DVT 07/01/2010  . Dysphagia, unspecified(787.20) 05/27/2010  . Essential thrombocythemia 07/30/2009  . SYNCOPE 07/30/2009  . MACULAR DEGENERATION 01/26/2008  . CORONARY ARTERY DISEASE 03/31/2007  . COLONIC POLYPS 03/12/2007  . HYPERCHOLESTEROLEMIA 03/12/2007  . Essential hypertension 03/12/2007  . 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 GERD 03/12/2007  . DEGENERATIVE JOINT DISEASE 03/12/2007  . BACK PAIN, LUMBAR 03/12/2007  . OSTEOPOROSIS 03/12/2007  . TRANSIENT ISCHEMIC ATTACKS, HX OF 03/12/2007   History  Substance Use Topics  . Smoking status: Never Smoker   . Smokeless tobacco: Never Used  . Alcohol Use: No   family history includes Cancer in her maternal aunt; Colon cancer (age of onset: 52) in her son; Crohn's disease in her son.     Objective:   Physical Exam  well-developed elderly white female in a wheelchair company by her son and daughter blood pressure 100/62 pulse 85 height 5 foot 1 weight 99. HEENT; nontraumatic normocephalic EOMI PERRLA sclera anicteric, Supple ;no JVD, Cardiovascular; regular rate and rhythm with D6-U4  soft systolic murmur pulmonary clear bilaterally she is kyphotic, Abdomen; soft nontender nondistended bowel sounds are active there is no palpable mass or hepatosplenomegaly, Rectal; exam not done, Ext; no clubbing cyanosis or edema skin warm and dry, Psych; patient is hard of hearing and mentation is somewhat slow ,but appropriate        Assessment & Plan:   #9 80 year old female with recent episode of food impaction on 07/26/2013 with EGD showing a dilated atonic esophagus full of food and fluid. This felt to probably has an underlying achalasia. Currently tolerating soft foods but eating less than usual  #2 chronic anticoagulation with Xarelto #3 history of TIAs #4 history of DVT #5 coronary artery disease #6 COPD #7 dementia  Plan; patient should stay on a very soft mechanical soft diet and continue Ensure supplements twice daily for adequate calories Advise she stay in upright position for at least an hour after any meal.  Discussed further workup with her children and they would like to pursue manometry and then potential Botox injections if indicated. She is scheduled for esophageal manometry. If EGD with Botox indicated she will need to come off of her anticoagulants.  Addendum: Reviewed and agree with initial management. Jerene Bears, MD

## 2013-07-30 NOTE — Telephone Encounter (Signed)
Patient's esophageal manometry is rescheduled to 08/25/13 10:00 at [facility]  Her daughter is advised of the appt date and time.  All questions answered

## 2013-07-30 NOTE — Patient Instructions (Addendum)
You are scheduled for an Esphageal Manometry. Date is 08-18-2013 Monday Time is 10 :00 am and you are to arrive at 9:45 am. Have nothing by mouth after midnight.  Location is [facility] to admitting and you will be taken to the Enscopy Unit on the first floor.

## 2013-08-07 ENCOUNTER — Encounter: Payer: Self-pay | Admitting: Internal Medicine

## 2013-08-21 LAB — CBC AND DIFFERENTIAL
HCT: 35 % — AB (ref 36–46)
Hemoglobin: 11.4 g/dL — AB (ref 12.0–16.0)
Platelets: 361 10*3/uL (ref 150–399)
WBC: 4.9 10*3/mL

## 2013-08-25 ENCOUNTER — Other Ambulatory Visit: Payer: Self-pay | Admitting: Nurse Practitioner

## 2013-08-25 ENCOUNTER — Encounter (HOSPITAL_COMMUNITY)
Admission: RE | Disposition: A | Discharge status: Home / Self Care | Payer: Self-pay | Source: Ambulatory Visit | Attending: Internal Medicine

## 2013-08-25 ENCOUNTER — Ambulatory Visit (HOSPITAL_COMMUNITY)
Admission: RE | Admit: 2013-08-25 | Discharge: 2013-08-25 | Disposition: A | Discharge status: Home / Self Care | Payer: Medicare Other | Source: Ambulatory Visit | Attending: Internal Medicine | Admitting: Internal Medicine

## 2013-08-25 DIAGNOSIS — K220 Achalasia of cardia: Secondary | ICD-10-CM | POA: Insufficient documentation

## 2013-08-25 DIAGNOSIS — R1310 Dysphagia, unspecified: Secondary | ICD-10-CM

## 2013-08-25 HISTORY — PX: ESOPHAGEAL MANOMETRY: SHX5429

## 2013-08-25 SURGERY — MANOMETRY, ESOPHAGUS

## 2013-08-25 MED ORDER — LIDOCAINE VISCOUS 2 % MT SOLN
OROMUCOSAL | Status: AC
  Filled 2013-08-25: qty 15

## 2013-08-25 SURGICAL SUPPLY — 1 items: FACESHIELD LNG OPTICON STERILE (SAFETY) IMPLANT

## 2013-08-26 ENCOUNTER — Encounter (HOSPITAL_COMMUNITY): Payer: Self-pay | Admitting: Internal Medicine

## 2013-08-26 ENCOUNTER — Telehealth: Payer: Self-pay

## 2013-08-26 NOTE — Telephone Encounter (Signed)
Pt scheduled to see Nicoletta Ba PA 09/09/13@1 :30pm. Pts son aware of results and appt.

## 2013-08-26 NOTE — Telephone Encounter (Signed)
Message copied by Algernon Huxley on Tue Aug 26, 2013  3:05 PM ------      Message from: Jerene Bears      Created: Tue Aug 26, 2013  1:15 PM      Regarding: Marine Lezotte had esophageal manometry performed Monday      This showed achalasia which is very abnormal swallowing function leading to her trouble swallowing symptoms      She has recently been treated for Candida      She will need followup in the office with either myself or Amy to discuss other treatment options.      Please communicate results to patient's healthcare power of attorney and schedule followup      Thank you      JMP ------

## 2013-08-27 ENCOUNTER — Non-Acute Institutional Stay (SKILLED_NURSING_FACILITY): Payer: Medicare Other | Admitting: Nurse Practitioner

## 2013-08-27 ENCOUNTER — Encounter: Payer: Self-pay | Admitting: Nurse Practitioner

## 2013-08-27 DIAGNOSIS — F341 Dysthymic disorder: Secondary | ICD-10-CM

## 2013-08-27 DIAGNOSIS — M545 Low back pain, unspecified: Secondary | ICD-10-CM

## 2013-08-27 DIAGNOSIS — F329 Major depressive disorder, single episode, unspecified: Secondary | ICD-10-CM

## 2013-08-27 DIAGNOSIS — F419 Anxiety disorder, unspecified: Secondary | ICD-10-CM

## 2013-08-27 DIAGNOSIS — K219 Gastro-esophageal reflux disease without esophagitis: Secondary | ICD-10-CM

## 2013-08-27 DIAGNOSIS — R634 Abnormal weight loss: Secondary | ICD-10-CM

## 2013-08-27 DIAGNOSIS — F32A Depression, unspecified: Secondary | ICD-10-CM

## 2013-08-27 DIAGNOSIS — F0390 Unspecified dementia without behavioral disturbance: Secondary | ICD-10-CM

## 2013-08-27 DIAGNOSIS — D473 Essential (hemorrhagic) thrombocythemia: Secondary | ICD-10-CM

## 2013-08-27 DIAGNOSIS — J4489 Other specified chronic obstructive pulmonary disease: Secondary | ICD-10-CM

## 2013-08-27 DIAGNOSIS — J449 Chronic obstructive pulmonary disease, unspecified: Secondary | ICD-10-CM

## 2013-08-27 DIAGNOSIS — I10 Essential (primary) hypertension: Secondary | ICD-10-CM

## 2013-08-27 NOTE — Assessment & Plan Note (Signed)
Managed with Fluticasone-Salmeterol 250/50 bid, dc'd Epipen prn and Immunotherapy per [facility] 01/17/13, Montelukast 10mg  daily, and Albuterol II puffs q6hr prn. Stable

## 2013-08-27 NOTE — Assessment & Plan Note (Signed)
Stable on Omeprazole 20 mg daily.

## 2013-08-27 NOTE — Assessment & Plan Note (Signed)
Hx of lower back pain, had X-ray of her lumbar spine 09/08/12--spondylosis-better controlled with Hydrocodone/ApAp  10/325mg  ac and hs  01/01/13 lumbar spine inj. Able to ambulate with walker on unit until her fall 06/22/13. 06/25/13 unremarkable. X-ray lumbar spine. continue Oxycodone 5mg  q4hr prn for better pain control. F/u [name].

## 2013-08-27 NOTE — Assessment & Plan Note (Signed)
Controlled on Metoprolol 25mg  bid.

## 2013-08-27 NOTE — Assessment & Plan Note (Signed)
Worsened. Since husband expired. Continue weight loss #9Ibs in the past 3 weeks regardless increased  Sertraline 50mg  daily  Since 07/16/13 and prn Lorazepam. Will add Mirtazapine 7.5mg  daily.

## 2013-08-27 NOTE — Assessment & Plan Note (Addendum)
#  9Ibs in the past 3 weeks. Will add Mirtazapine 7.5mg  nightly. Encourage oral intake and nutritional supplement. Update CMP and TSH 08/28/13 wnl.

## 2013-08-27 NOTE — Assessment & Plan Note (Signed)
/  u Hematology, takes Hydrea 1000mg  Monday, 500mg  Wed, Fri, Sun. Plt 413 12/05/12 and 264 05/15/13.Chronic anticoagulation with Xarelto. 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

## 2013-08-27 NOTE — Progress Notes (Signed)
Patent ID: Sherri Crawford, female   DOB: 02-13-1921, 80 y.o.   MRN: [id number]    Patient ID: Sherri Crawford, female   DOB: November 17, 1920, 80 y.o.   MRN: [id number]  Code Status: DNR  Allergies  Allergen Reactions  . Sulfonamide Derivatives Other (See Comments)    Pt is unsure of reaction    Chief Complaint  Patient presents with  . Medical Management of Chronic Issues  . Acute Visit    weight loss    HPI: Patient is a 80 y.o. female seen in the SNF at [facility] today for evaluation of weight loss and other chronic medical conditions.    ED 07/26/13 for EGD foreign body removal/Balloon dilation of esophagus. Her EKG shows no new ischemic changes and her troponin is negative. CT scan of her chest shows no sign of pulmonary embolus but there is distal esophageal food impaction  No further chest pain, nausea, or vomiting since s/p EGD foreign body removal and Balloon of esophagus dilation. Also she was found to have candida esophagitis-completed Diflucan 100mg  daily x5 today.   Problem List Items Addressed This Visit   Essential thrombocythemia     /u Hematology, takes Hydrea 1000mg  Monday, 500mg  Wed, Fri, Sun. Plt 413 12/05/12 and 264 05/15/13.Chronic anticoagulation with Xarelto. 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       COPD with asthma     Managed with Fluticasone-Salmeterol 250/50 bid, dc'd Epipen prn and Immunotherapy per [facility] 01/17/13, Montelukast 10mg  daily, and Albuterol II puffs q6hr prn. Stable    GERD     Stable on Omeprazole 20mg  daily.       BACK PAIN, LUMBAR     Hx of lower back pain, had X-ray of her lumbar spine 09/08/12--spondylosis-better controlled with Hydrocodone/ApAp  10/325mg  ac and hs  01/01/13 lumbar spine inj. Able to ambulate with walker on unit until her fall 06/22/13. 06/25/13 unremarkable. X-ray lumbar spine. continue  Oxycodone 5mg  q4hr prn for better pain control. F/u [name].       Anxiety and depression     Worsened. Since husband expired. Continue weight loss #9Ibs in the past 3 weeks regardless increased  Sertraline 50mg  daily  Since 07/16/13 and prn Lorazepam. Will add Mirtazapine 7.5mg  daily.       Dementia     Presently taking Namenda daily and SNF for care needs     Relevant Medications      mirtazapine (REMERON) 7.5 MG tablet   Loss of weight - Primary     #9Ibs in the past 3 weeks. Will add Mirtazapine 7.5mg  nightly. Encourage oral intake and nutritional supplement. Update CMP and TSH       Review of Systems:  Review of Systems  Constitutional: Positive for weight loss. Negative for fever, chills, malaise/fatigue and diaphoresis.       #9Ibs/3 weeks.   HENT: Positive for hearing loss. Negative for congestion, ear discharge, ear pain, nosebleeds, sore throat and tinnitus.   Eyes: Negative for blurred vision, double vision, photophobia, pain, discharge and redness.  Respiratory: Positive for cough. Negative for hemoptysis, sputum production, shortness of breath, wheezing (occasional. ) and stridor.   Cardiovascular: Negative for chest pain, palpitations, orthopnea, claudication and PND. Leg swelling: trace RLE-resolved.   Gastrointestinal: Positive for vomiting. Negative for heartburn, nausea, abdominal pain, diarrhea, constipation, blood in stool and melena.  Dysphagia.   Genitourinary: Positive for frequency. Negative for dysuria, urgency, hematuria and flank pain.  Musculoskeletal: Positive for falls (recent and resulted in the right hip fx. ) and joint pain (right hip). Negative for myalgias and neck pain. Back pain: chronic back pain is managed with Norco.       Ambulates with walker. Hx of lower back pain: had multiple inj and presently taking Norco 5xday. Worsened lower back pain since her fall 06/22/13-no longer able to ambulate with walker today.   Skin: Negative for itching and  rash.       Itching scaly mid of R+L shins.   Neurological: Negative for dizziness, tingling, tremors, sensory change, speech change, focal weakness, seizures, loss of consciousness, weakness and headaches.  Endo/Heme/Allergies: Positive for environmental allergies. Negative for polydipsia. Bruises/bleeds easily.  Psychiatric/Behavioral: Positive for depression and memory loss. Negative for suicidal ideas, hallucinations and substance abuse. The patient is nervous/anxious. The patient does not have insomnia.      Past Medical History  Diagnosis Date  . Macular degeneration   . COPD (chronic obstructive pulmonary disease)   . Hypertension   . Coronary artery disease   . Hypercholesterolemia   . GERD (gastroesophageal reflux disease)   . Asthmatic bronchitis   . History of colonic polyps   . DJD (degenerative joint disease)   . Lumbar back pain   . Osteoporosis   . History of transient ischemic attack (TIA)   . Anxiety   . Essential thrombocythemia   . Diverticulosis 05/10/2012  . Osteoarthrosis, unspecified whether generalized or localized, unspecified site 05/09/2012  . Memory loss 05/09/2012  . Abnormal weight gain 05/09/2012  . Phlebitis and thrombophlebitis of other deep vessels of lower extremities 07/10/2010  . Essential thrombocythemia 06/09/2009  . Depression    Past Surgical History  Procedure Laterality Date  . Appendectomy    . Cholecystectomy    . Tah and bso  08/21/1995  . Cataract extraction w/ intraocular lens  implant, bilateral    . Abdominal hysterectomy    . Hip arthroplasty Right 09/09/2012    Procedure: ARTHROPLASTY BIPOLAR HIP;  Surgeon: Mauri Pole, MD;  Location: [facility];  Service: Orthopedics;  Laterality: Right;  . Esophagogastroduodenoscopy (egd) with esophageal dilation N/A 11/25/2012    Procedure: ESOPHAGOGASTRODUODENOSCOPY (EGD) WITH ESOPHAGEAL DILATION;  Surgeon: Jerene Bears, MD;  Location: [facility] END[facility]COPY;  Service: Gastroenterology;  Laterality: N/A;    . Esophagogastroduodenoscopy N/A 07/26/2013    Procedure: ESOPHAGOGASTRODUODENOSCOPY (EGD);  Surgeon: Irene Shipper, MD;  Location: [facility] ENDOSCOPY;  Service: Endoscopy;  Laterality: N/A;  . Esophageal manometry N/A 08/25/2013    Procedure: ESOPHAGEAL MANOMETRY (EM);  Surgeon: Jerene Bears, MD;  Location: [facility] ENDOSCOPY;  Service: Gastroenterology;  Laterality: N/A;   Social History:   reports that she has never smoked. She has never used smokeless tobacco. She reports that she does not drink alcohol or use illicit drugs.  Family History  Problem Relation Age of Onset  . Colon cancer Son 38  . Crohn's disease Son   . Cancer Maternal Aunt     Medications: Patient's Medications  New Prescriptions   No medications on file  Previous Medications   ALBUTEROL SULFATE (VENTOLIN HFA IN)    Inhale 90 mcg into the lungs every 6 (six) hours as needed (Inhale 2 puffs PO into lungs every 6 hours as needed for wheezing.).   ATORVASTATIN (LIPITOR) 20 MG TABLET    Take 20 mg by mouth every evening.  CHOLECALCIFEROL (VITAMIN D) 1000 UNITS TABLET    Take 1,000 Units by mouth every morning.   FEEDING SUPPLEMENT (RESOURCE BREEZE) LIQD    Take 90 mLs by mouth 4 (four) times daily.    FLUTICASONE-SALMETEROL (ADVAIR) 250-50 MCG/DOSE AEPB    Inhale 1 puff into the lungs every 12 (twelve) hours.   HYDROCODONE-ACETAMINOPHEN (NORCO) 10-325 MG PER TABLET    Take one tablet by mouth four times daily for pain   HYDROXYUREA (HYDREA) 500 MG CAPSULE    Take 500-1,000 mg by mouth See admin instructions. Take 1000 mg on Monday and 500 mg on Sunday, Wednesday and Friday   LORAZEPAM (ATIVAN) 0.5 MG TABLET    Take 0.5 mg by mouth every 8 (eight) hours.   MEMANTINE HCL ER (NAMENDA XR) 14 MG CP24    Take 14 mg by mouth daily.   METOPROLOL TARTRATE (LOPRESSOR) 25 MG TABLET    Take 25 mg by mouth 2 (two) times daily.   MIRTAZAPINE (REMERON) 7.5 MG TABLET    Take 7.5 mg by mouth at bedtime.   MONTELUKAST (SINGULAIR) 10 MG TABLET     Take 10 mg by mouth at bedtime.   MULTIPLE VITAMINS-MINERALS (CERTAVITE SENIOR/ANTIOXIDANT PO)    Take 1 tablet by mouth every morning.   NITROGLYCERIN (NITROSTAT) 0.4 MG SL TABLET    Place 0.4 mg under the tongue every 5 (five) minutes as needed for chest pain.   OMEPRAZOLE (PRILOSEC) 20 MG CAPSULE    Take 20 mg by mouth every morning.    OXYCODONE (OXY IR/ROXICODONE) 5 MG IMMEDIATE RELEASE TABLET    Take 5 mg by mouth every 4 (four) hours as needed for severe pain.   PROMETHAZINE (PHENERGAN) 12.5 MG TABLET    Take 12.5 mg by mouth every 6 (six) hours as needed for nausea or vomiting.   RIVAROXABAN (XARELTO) 15 MG TABS TABLET    Take 15 mg by mouth daily.   SERTRALINE (ZOLOFT) 50 MG TABLET    Take 50 mg by mouth daily.  Modified Medications   No medications on file  Discontinued Medications   No medications on file     Physical Exam: Physical Exam  Constitutional: She is oriented to person, place, and time. She appears well-developed and well-nourished. No distress.  HENT:  Head: Normocephalic and atraumatic.  Eyes: Conjunctivae and EOM are normal. Pupils are equal, round, and reactive to light.  Neck: Normal range of motion. Neck supple. No JVD present. No thyromegaly present.  Cardiovascular: Normal rate, regular rhythm and normal heart sounds.   Pulmonary/Chest: Effort normal. She has decreased breath sounds. She has no wheezes. She has no rales.  Decreased breath sounds Bilat. Lungs.   Abdominal: Soft. Bowel sounds are normal. There is no tenderness.  Musculoskeletal: Normal range of motion. She exhibits tenderness (chronic lower back and the right hip since the fx. better controloled with scheduled Norco. ). She exhibits no edema.  Lower back.  Lymphadenopathy:    She has no cervical adenopathy.  Neurological: She is alert and oriented to person, place, and time. She has normal reflexes. No cranial nerve deficit. She exhibits normal muscle tone. Coordination normal.  Skin: Skin  is warm and dry. No rash noted. She is not diaphoretic. No erythema (LLE).  The right hip surgical incision healed. Itching/scaly mid shins  Psychiatric: Her mood appears anxious. Her affect is not angry, not blunt, not labile and not inappropriate. Her speech is not delayed, not tangential and not slurred. She is hyperactive  and slowed. She is not agitated, not aggressive, not withdrawn and not combative. Thought content is not paranoid and not delusional. Cognition and memory are impaired. She does not express impulsivity or inappropriate judgment. She exhibits a depressed mood. She exhibits abnormal recent memory.    Filed Vitals:   08/27/13 1709  BP: 140/80  Pulse: 78  Temp: 98.2 F (36.8 C)  TempSrc: Tympanic  Resp: 18      Labs reviewed: Basic Metabolic Panel:  Recent Labs  10/18/12  11/22/12 1800 11/22/12 2249 11/23/12 0435  05/27/13 1446 07/26/13 1131 08/28/13  NA  --   < > 141  --  143  < > 140 144 141  K  --   < > 3.5  --  3.6  < > 4.5 4.0 4.2  CL  --   --  101  --  106  --   --  104  --   CO2  --   < > 24  --  22  --  28 23  --   GLUCOSE  --   < > 83  --  69*  --  121 93  --   BUN  --   < > 17  --  14  < > 22.8 23 17   CREATININE  --   < > 0.74  --  0.75  < > 1.1 0.84 0.8  CALCIUM  --   < > 9.9  --  8.5  --  9.5 10.0  --   TSH 2.81  --   --  0.129*  --   --   --   --  1.32  < > = values in this interval not displayed. Liver Function Tests:  Recent Labs  11/22/12 1800 11/23/12 0435 05/15/13 05/27/13 1446 08/28/13  AST 23 22 16 19 18   ALT 12 11 10 11 12   ALKPHOS 81 73 47 63 56  BILITOT 0.5 0.5  --  0.33  --   PROT 7.1 6.1  --  6.5  --   ALBUMIN 3.7 3.0*  --  3.6  --    CBC:  Recent Labs  11/23/12 0435  11/26/12 0526  05/27/13 1549 06/26/13 07/26/13 1131 08/21/13  WBC 8.4  < > 7.1  < > 5.7 6.0 7.9 4.9  NEUTROABS 6.9  --   --   --  3.7  --  7.1  --   HGB 12.4  < > 11.2*  < > 12.6 11.8* 12.7 11.4*  HCT 39.1  < > 35.0*  < > 37.9 34* 39.4 35*  MCV  110.1*  < > 106.7*  --  110.1*  --  112.6*  --   PLT 316  < > 291  < > 332 288 306 361  < > = values in this interval not displayed.  Past Procedures:  09/09/12  CT head:  IMPRESSION: No skull fracture or intracranial hemorrhage.  Small vessel disease type changes without CT evidence of large acute infarct.  X-ray right hip  IMPRESSION: Right femoral prosthesis without acute complication  X-ray Lumbar spine  IMPRESSION: Diffuse lumbar spondylosis.  No evidence of fracture.   X--ray R hip  IMPRESSION: Displaced right subcapital hip fracture.  11/25/12 GED: Esophageal dysmotility w/o definitive stricture. S/p empiric dilation in the lower third of the esophagus and across GE junction with TTS balloon to 84mm. Recommended diet as tolerated.   06/25/13 X-ray lumbar spine: disc disease, slight rotoscoliosis curvature to the left, the  is a right hip prosthesis present, there is an eggshell calcification in the right upper quadrant of uncertain etiology.    Assessment/Plan Anxiety and depression Worsened. Since husband expired. Continue weight loss #9Ibs in the past 3 weeks regardless increased  Sertraline 50mg  daily  Since 07/16/13 and prn Lorazepam. Will add Mirtazapine 7.5mg  daily.     Loss of weight #9Ibs in the past 3 weeks. Will add Mirtazapine 7.5mg  nightly. Encourage oral intake and nutritional supplement. Update CMP and TSH  Essential thrombocythemia /u Hematology, takes Hydrea 1000mg  Monday, 500mg  Wed, Fri, Sun. Plt 413 12/05/12 and 264 05/15/13.Chronic anticoagulation with Xarelto. 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 Able to ambulate with walker on unit until her fall 06/22/13.  06/25/13 unremarkable. X-ray lumbar spine. continue Oxycodone 5mg  q4hr prn for better pain control. F/u [name].     Essential hypertension Controlled on Metoprolol 25mg  bid.     COPD with asthma Managed with Fluticasone-Salmeterol 250/50 bid, dc'd Epipen prn and Immunotherapy per [facility] 01/17/13, Montelukast 10mg  daily, and Albuterol II puffs q6hr prn. Stable  GERD Stable on Omeprazole 20mg  daily.       Family/ Staff Communication: observe the patient  Goals of Care: failed AL and returned to SNF for care.   Labs/tests ordered: CMP and TSH done 08/28/13 wnl

## 2013-08-27 NOTE — Assessment & Plan Note (Signed)
Presently taking Namenda daily and SNF for care needs

## 2013-08-28 LAB — BASIC METABOLIC PANEL
BUN: 17 mg/dL (ref 4–21)
CREATININE: 0.8 mg/dL (ref 0.5–1.1)
Glucose: 82 mg/dL
Potassium: 4.2 mmol/L (ref 3.4–5.3)
Sodium: 141 mmol/L (ref 137–147)

## 2013-08-28 LAB — HEPATIC FUNCTION PANEL
ALK PHOS: 56 U/L (ref 25–125)
ALT: 12 U/L (ref 7–35)
AST: 18 U/L (ref 13–35)
Bilirubin, Total: 0.4 mg/dL

## 2013-08-28 LAB — TSH: TSH: 1.32 u[IU]/mL (ref 0.41–5.90)

## 2013-09-09 ENCOUNTER — Ambulatory Visit (INDEPENDENT_AMBULATORY_CARE_PROVIDER_SITE_OTHER): Payer: Medicare Other | Admitting: Physician Assistant

## 2013-09-09 ENCOUNTER — Encounter: Payer: Self-pay | Admitting: Physician Assistant

## 2013-09-09 ENCOUNTER — Telehealth: Payer: Self-pay | Admitting: *Deleted

## 2013-09-09 VITALS — BP 110/60 | HR 64

## 2013-09-09 DIAGNOSIS — R1319 Other dysphagia: Secondary | ICD-10-CM

## 2013-09-09 DIAGNOSIS — K220 Achalasia of cardia: Secondary | ICD-10-CM

## 2013-09-09 DIAGNOSIS — R1310 Dysphagia, unspecified: Secondary | ICD-10-CM

## 2013-09-09 NOTE — Telephone Encounter (Signed)
[facility] called and stated that patient is going to have a procedure next week and they stopped patient's Xarelto and wanted to send notes to patient's PCP. Patient is in [place]. Gave her the number to the Skilled Unit.

## 2013-09-09 NOTE — Progress Notes (Signed)
Subjective:    Patient ID: Sherri Crawford, female    DOB: [birth year], 80 y.o.   MRN: [id number]  HPI  Sherri Crawford  is a pleasant 80 year old white female known to Dr. Olevia Perches remotely. She had recently been seen for severe solid food dysphagia and had presented to the emergency room on 07/26/2013 with a food impaction. She underwent an EGD at that time per Dr. Henrene Pastor with finding of esophageal dysmotility suspect achalasia with dilated at the GE junction to 15 mm and also noted to have Candida esophagitis. She was treated with a 10 day course of Diflucan and then scheduled for a manometry. She comes back in today to discuss results of the manometry. This was done on 08/25/2013 and shows markedly abnormal esophageal motility with frequent failed peristalsis and elevated LES residual pressure consistent with achalasia. The patient and her son both state that she had one episode about a week ago where she had 3-4 days of significant difficulty with dysphagia. She was given Ativan at the nursing home and this seemed to help. Now over the past week she has been eating better and keeping most of her food down. She is eating a normal diet. She says she has had an occasional episode of regurgitation but these been in frequent over the past week. Her son is concerned because she is lost 5 pounds over the past couple weeks and is now down to about 95 pounds. She has no complaints of odynophagia or chest pain and no abdominal pain. She is on Xarelto .    Review of Systems  Constitutional: Positive for unexpected weight change.  HENT: Positive for trouble swallowing.   Respiratory: Negative.   Cardiovascular: Negative.   Gastrointestinal: Positive for vomiting.  Musculoskeletal: Positive for gait problem.  Neurological: Positive for weakness.  Hematological: Negative.    Outpatient Prescriptions Prior to Visit  Medication Sig Dispense Refill  . Albuterol Sulfate (VENTOLIN HFA IN) Inhale 90 mcg into the lungs  every 6 (six) hours as needed (Inhale 2 puffs PO into lungs every 6 hours as needed for wheezing.).      Marland Kitchen atorvastatin (LIPITOR) 20 MG tablet Take 20 mg by mouth every evening.       . cholecalciferol (VITAMIN D) 1000 UNITS tablet Take 1,000 Units by mouth every morning.      . feeding supplement (RESOURCE BREEZE) LIQD Take 90 mLs by mouth 4 (four) times daily.       . Fluticasone-Salmeterol (ADVAIR) 250-50 MCG/DOSE AEPB Inhale 1 puff into the lungs every 12 (twelve) hours.      Marland Kitchen HYDROcodone-acetaminophen (NORCO) 10-325 MG per tablet Take one tablet by mouth four times daily for pain  120 tablet  0  . hydroxyurea (HYDREA) 500 MG capsule Take 500-1,000 mg by mouth See admin instructions. Take 1000 mg on Monday and 500 mg on Sunday, Wednesday and Friday      . LORazepam (ATIVAN) 0.5 MG tablet Take 0.5 mg by mouth every 8 (eight) hours.      . Memantine HCl ER (NAMENDA XR) 14 MG CP24 Take 14 mg by mouth daily.      . metoprolol tartrate (LOPRESSOR) 25 MG tablet Take 25 mg by mouth 2 (two) times daily.      . mirtazapine (REMERON) 7.5 MG tablet Take 7.5 mg by mouth at bedtime.      . montelukast (SINGULAIR) 10 MG tablet Take 10 mg by mouth at bedtime.      . Multiple Vitamins-Minerals (CERTAVITE  SENIOR/ANTIOXIDANT PO) Take 1 tablet by mouth every morning.      . nitroGLYCERIN (NITROSTAT) 0.4 MG SL tablet Place 0.4 mg under the tongue every 5 (five) minutes as needed for chest pain.      Marland Kitchen omeprazole (PRILOSEC) 20 MG capsule Take 20 mg by mouth every morning.       Marland Kitchen oxyCODONE (OXY IR/ROXICODONE) 5 MG immediate release tablet Take 5 mg by mouth every 4 (four) hours as needed for severe pain.      . promethazine (PHENERGAN) 12.5 MG tablet Take 12.5 mg by mouth every 6 (six) hours as needed for nausea or vomiting.      . Rivaroxaban (XARELTO) 15 MG TABS tablet Take 15 mg by mouth daily.      . sertraline (ZOLOFT) 50 MG tablet Take 50 mg by mouth daily.       No facility-administered medications prior  to visit.   Allergies  Allergen Reactions  . Sulfonamide Derivatives Other (See Comments)    Pt is unsure of reaction   Patient Active Problem List   Diagnosis Date Noted  . Loss of weight 08/27/2013  . S/P balloon dilatation of esophageal stricture 07/28/2013  . Candida esophagitis 07/28/2013  . Esophageal dysmotility 07/26/2013  . Foreign body in esophagus 07/26/2013  . Nonspecific (abnormal) findings on radiological and other examination of gastrointestinal tract 07/26/2013  . Fall 06/25/2013  . Protein-calorie malnutrition, severe 11/24/2012  . Dry skin dermatitis 11/18/2012  . Dementia 09/16/2012  . Hip fracture 09/08/2012  . Deep vein thrombosis of left lower extremity 07/19/2012  . Anxiety and depression 03/12/2012  . DVT (deep venous thrombosis) 07/13/2010  . Edema of leg 07/01/2010  . Left leg DVT 07/01/2010  . Dysphagia, unspecified(787.20) 05/27/2010  . Essential thrombocythemia 07/30/2009  . SYNCOPE 07/30/2009  . MACULAR DEGENERATION 01/26/2008  . CORONARY ARTERY DISEASE 03/31/2007  . COLONIC POLYPS 03/12/2007  . HYPERCHOLESTEROLEMIA 03/12/2007  . Essential hypertension 03/12/2007  . 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 GERD 03/12/2007  . DEGENERATIVE JOINT DISEASE 03/12/2007  . BACK PAIN, LUMBAR 03/12/2007  . OSTEOPOROSIS 03/12/2007  . TRANSIENT ISCHEMIC ATTACKS, HX OF 03/12/2007   History  Substance Use Topics  . Smoking status: Never Smoker   . Smokeless tobacco: Never Used  . Alcohol Use: No    family history includes Cancer in her maternal aunt; Colon cancer (age of onset: 36) in her son; Crohn's disease in her son.  Objective:   Physical Exam well-developed elderly white female thin frail-appearing sitting in a wheelchair blood pressure 110/60 pulse 64. HEENT; nontraumatic normocephalic EOMI PERRLA sclera anicteric, Supple ;no JVD, Cardiovascular; regular rate and rhythm with S1-S2 she does soft systolic murmur, Pulmonary; clear bilaterally she does have a  significant kyphosis, Abdomen ;soft nontender nondistended bowel sounds are active, Rectal ;exam not done, Extremities ;no clubbing cyanosis or edema skin warm dry, Psych; mood and affect normal and appropriate she is hard of hearing        Assessment & Plan:  #45  80 year old female with new diagnosis of achalasia with significant solid and liquid dysphagia and intermittent regurgitation and associated weight loss #2 chronic anticoagulation with Xarelto #3 coronary artery disease #4 history of TIAs #5 history of DVT #6 COPD #7 dementia #8 recent candidiasis of the esophagus treated  Plan; long discussion with patient and her son regarding options including repeat EGD with Botox injections. At her advanced age she is very poor surgical candidate. They are agreeable to EGD with Botox and  this is scheduled with Dr. [name] Patient will need to hold Xarelto for 24 hours prior to the procedure. We will obtain consent from her PCP to hold Xarelto.

## 2013-09-09 NOTE — Patient Instructions (Signed)
You have been scheduled for an endoscopy. Please follow written instructions given to you at your visit today. If you use inhalers (even only as needed), please bring them with you on the day of your procedure.   We will confirm with the doctor who manages your Xarelto the directions we gave you.

## 2013-09-10 NOTE — Progress Notes (Signed)
Reviewed and agree. ?? Pseudoachalasia?, will try Botox and Procardia

## 2013-09-12 ENCOUNTER — Encounter: Payer: Self-pay | Admitting: Internal Medicine

## 2013-09-16 ENCOUNTER — Encounter (HOSPITAL_COMMUNITY): Payer: Self-pay | Admitting: Pharmacy Technician

## 2013-09-16 ENCOUNTER — Encounter (HOSPITAL_COMMUNITY): Payer: Self-pay | Admitting: *Deleted

## 2013-09-16 NOTE — Progress Notes (Signed)
09-16-13 Final instructions faxed 1410 pm and history completed per [facility] record information and MAR.

## 2013-09-17 NOTE — H&P (View-Only) (Signed)
09-16-13 Final instructions faxed 1410 pm and history completed per [facility] record information and MAR.

## 2013-09-17 NOTE — Interval H&P Note (Signed)
History and Physical Interval Note:  09/17/2013 1:13 PM  Sherri Crawford  has presented today for surgery, with the diagnosis of Dysphagia Achalasia  The various methods of treatment have been discussed with the patient and family. After consideration of risks, benefits and other options for treatment, the patient has consented to  Procedure(s): ESOPHAGOGASTRODUODENOSCOPY (EGD) WITH PROPOFOL (N/A) BOTOX INJECTION (N/A) as a surgical intervention .  The patient's history has been reviewed, patient examined, no change in status, stable for surgery.  I have reviewed the patient's chart and labs.  Questions were answered to the patient's satisfaction.     [name]

## 2013-09-18 ENCOUNTER — Ambulatory Visit (HOSPITAL_COMMUNITY)
Admission: RE | Admit: 2013-09-18 | Discharge: 2013-09-18 | Disposition: A | Discharge status: Home / Self Care | Payer: Medicare Other | Source: Ambulatory Visit | Attending: Internal Medicine | Admitting: Internal Medicine

## 2013-09-18 ENCOUNTER — Encounter (HOSPITAL_COMMUNITY)
Admission: RE | Disposition: A | Discharge status: Home / Self Care | Payer: Medicare Other | Source: Ambulatory Visit | Attending: Internal Medicine

## 2013-09-18 ENCOUNTER — Encounter (HOSPITAL_COMMUNITY): Payer: Medicare Other | Admitting: Certified Registered Nurse Anesthetist

## 2013-09-18 ENCOUNTER — Ambulatory Visit (HOSPITAL_COMMUNITY): Payer: Medicare Other | Admitting: Certified Registered Nurse Anesthetist

## 2013-09-18 ENCOUNTER — Encounter (HOSPITAL_COMMUNITY): Payer: Self-pay

## 2013-09-18 DIAGNOSIS — I10 Essential (primary) hypertension: Secondary | ICD-10-CM | POA: Diagnosis not present

## 2013-09-18 DIAGNOSIS — J449 Chronic obstructive pulmonary disease, unspecified: Secondary | ICD-10-CM | POA: Diagnosis not present

## 2013-09-18 DIAGNOSIS — R1319 Other dysphagia: Secondary | ICD-10-CM

## 2013-09-18 DIAGNOSIS — F411 Generalized anxiety disorder: Secondary | ICD-10-CM | POA: Diagnosis not present

## 2013-09-18 DIAGNOSIS — K219 Gastro-esophageal reflux disease without esophagitis: Secondary | ICD-10-CM | POA: Diagnosis not present

## 2013-09-18 DIAGNOSIS — K220 Achalasia of cardia: Secondary | ICD-10-CM

## 2013-09-18 DIAGNOSIS — F329 Major depressive disorder, single episode, unspecified: Secondary | ICD-10-CM | POA: Insufficient documentation

## 2013-09-18 DIAGNOSIS — J4489 Other specified chronic obstructive pulmonary disease: Secondary | ICD-10-CM | POA: Insufficient documentation

## 2013-09-18 DIAGNOSIS — Z7901 Long term (current) use of anticoagulants: Secondary | ICD-10-CM | POA: Diagnosis not present

## 2013-09-18 DIAGNOSIS — I2510 Atherosclerotic heart disease of native coronary artery without angina pectoris: Secondary | ICD-10-CM | POA: Diagnosis not present

## 2013-09-18 DIAGNOSIS — F3289 Other specified depressive episodes: Secondary | ICD-10-CM | POA: Insufficient documentation

## 2013-09-18 DIAGNOSIS — R1310 Dysphagia, unspecified: Secondary | ICD-10-CM | POA: Diagnosis not present

## 2013-09-18 DIAGNOSIS — Z79899 Other long term (current) drug therapy: Secondary | ICD-10-CM | POA: Diagnosis not present

## 2013-09-18 HISTORY — DX: Esophageal obstruction: K22.2

## 2013-09-18 HISTORY — PX: BOTOX INJECTION: SHX5754

## 2013-09-18 HISTORY — DX: Achalasia of cardia: K22.0

## 2013-09-18 HISTORY — PX: ESOPHAGOGASTRODUODENOSCOPY (EGD) WITH PROPOFOL: SHX5813

## 2013-09-18 LAB — CBC AND DIFFERENTIAL
HEMATOCRIT: 36 % (ref 36–46)
Hemoglobin: 11.5 g/dL — AB (ref 12.0–16.0)
Platelets: 437 10*3/uL — AB (ref 150–399)
WBC: 6.2 10^3/mL

## 2013-09-18 SURGERY — ESOPHAGOGASTRODUODENOSCOPY (EGD) WITH PROPOFOL
Anesthesia: Monitor Anesthesia Care

## 2013-09-18 MED ORDER — SODIUM CHLORIDE 0.9 % IV SOLN: INTRAVENOUS | Status: DC

## 2013-09-18 MED ORDER — ONDANSETRON HCL 4 MG/2ML IJ SOLN
INTRAMUSCULAR | Status: DC | PRN
  Administered 2013-09-18: 4 mg via INTRAVENOUS

## 2013-09-18 MED ORDER — LIDOCAINE HCL (CARDIAC) 20 MG/ML IV SOLN
INTRAVENOUS | Status: DC | PRN
  Administered 2013-09-18: 100 mg via INTRAVENOUS

## 2013-09-18 MED ORDER — ONABOTULINUMTOXINA 100 UNITS IJ SOLR
100.0000 [IU] | Freq: Once | INTRAMUSCULAR | Status: AC
  Administered 2013-09-18: 100 [IU] via SUBMUCOSAL
  Filled 2013-09-18: qty 100

## 2013-09-18 MED ORDER — KETAMINE HCL 10 MG/ML IJ SOLN
INTRAMUSCULAR | Status: DC | PRN
  Administered 2013-09-18: 10 mg via INTRAVENOUS

## 2013-09-18 MED ORDER — NIFEDIPINE 10 MG PO CAPS: 10.0000 mg | ORAL_CAPSULE | Freq: Three times a day (TID) | ORAL | Status: DC

## 2013-09-18 MED ORDER — LIDOCAINE HCL (CARDIAC) 20 MG/ML IV SOLN
INTRAVENOUS | Status: AC
  Filled 2013-09-18: qty 5

## 2013-09-18 MED ORDER — PROPOFOL 10 MG/ML IV BOLUS
INTRAVENOUS | Status: AC
  Filled 2013-09-18: qty 20

## 2013-09-18 MED ORDER — MIDAZOLAM HCL 2 MG/2ML IJ SOLN
INTRAMUSCULAR | Status: AC
  Filled 2013-09-18: qty 2

## 2013-09-18 MED ORDER — LACTATED RINGERS IV SOLN
INTRAVENOUS | Status: DC
  Administered 2013-09-18: 1000 mL via INTRAVENOUS

## 2013-09-18 MED ORDER — PROPOFOL INFUSION 10 MG/ML OPTIME
INTRAVENOUS | Status: DC | PRN
  Administered 2013-09-18: 100 ug/kg/min via INTRAVENOUS

## 2013-09-18 MED ORDER — ONDANSETRON HCL 4 MG/2ML IJ SOLN
INTRAMUSCULAR | Status: AC
  Filled 2013-09-18: qty 2

## 2013-09-18 SURGICAL SUPPLY — 14 items

## 2013-09-18 NOTE — Interval H&P Note (Signed)
History and Physical Interval Note:  09/18/2013 10:39 AM  Sherri Crawford  has presented today for surgery, with the diagnosis of Dysphagia Achalasia  The various methods of treatment have been discussed with the patient and family. After consideration of risks, benefits and other options for treatment, the patient has consented to  Procedure(s): ESOPHAGOGASTRODUODENOSCOPY (EGD) WITH PROPOFOL (N/A) BOTOX INJECTION (N/A) as a surgical intervention .  The patient's history has been reviewed, patient examined, no change in status, stable for surgery.  I have reviewed the patient's chart and labs.  Questions were answered to the patient's satisfaction.     [name]

## 2013-09-18 NOTE — H&P (View-Only) (Signed)
Subjective:    Patient ID: Sherri Crawford, female    DOB: 05/14/1920, 80 y.o.   MRN: [id number]  HPI  Sherri Crawford  is a pleasant 80 year old white female known to Dr. Olevia Perches remotely. She had recently been seen for severe solid food dysphagia and had presented to the emergency room on 07/26/2013 with a food impaction. She underwent an EGD at that time per Dr. Henrene Pastor with finding of esophageal dysmotility suspect achalasia with dilated at the GE junction to 15 mm and also noted to have Candida esophagitis. She was treated with a 10 day course of Diflucan and then scheduled for a manometry. She comes back in today to discuss results of the manometry. This was done on 08/25/2013 and shows markedly abnormal esophageal motility with frequent failed peristalsis and elevated LES residual pressure consistent with achalasia. The patient and her son both state that she had one episode about a week ago where she had 3-4 days of significant difficulty with dysphagia. She was given Ativan at the nursing home and this seemed to help. Now over the past week she has been eating better and keeping most of her food down. She is eating a normal diet. She says she has had an occasional episode of regurgitation but these been in frequent over the past week. Her son is concerned because she is lost 5 pounds over the past couple weeks and is now down to about 95 pounds. She has no complaints of odynophagia or chest pain and no abdominal pain. She is on Xarelto .    Review of Systems  Constitutional: Positive for unexpected weight change.  HENT: Positive for trouble swallowing.   Respiratory: Negative.   Cardiovascular: Negative.   Gastrointestinal: Positive for vomiting.  Musculoskeletal: Positive for gait problem.  Neurological: Positive for weakness.  Hematological: Negative.    Outpatient Prescriptions Prior to Visit  Medication Sig Dispense Refill  . Albuterol Sulfate (VENTOLIN HFA IN) Inhale 90 mcg into the lungs  every 6 (six) hours as needed (Inhale 2 puffs PO into lungs every 6 hours as needed for wheezing.).      Marland Kitchen atorvastatin (LIPITOR) 20 MG tablet Take 20 mg by mouth every evening.       . cholecalciferol (VITAMIN D) 1000 UNITS tablet Take 1,000 Units by mouth every morning.      . feeding supplement (RESOURCE BREEZE) LIQD Take 90 mLs by mouth 4 (four) times daily.       . Fluticasone-Salmeterol (ADVAIR) 250-50 MCG/DOSE AEPB Inhale 1 puff into the lungs every 12 (twelve) hours.      Marland Kitchen HYDROcodone-acetaminophen (NORCO) 10-325 MG per tablet Take one tablet by mouth four times daily for pain  120 tablet  0  . hydroxyurea (HYDREA) 500 MG capsule Take 500-1,000 mg by mouth See admin instructions. Take 1000 mg on Monday and 500 mg on Sunday, Wednesday and Friday      . LORazepam (ATIVAN) 0.5 MG tablet Take 0.5 mg by mouth every 8 (eight) hours.      . Memantine HCl ER (NAMENDA XR) 14 MG CP24 Take 14 mg by mouth daily.      . metoprolol tartrate (LOPRESSOR) 25 MG tablet Take 25 mg by mouth 2 (two) times daily.      . mirtazapine (REMERON) 7.5 MG tablet Take 7.5 mg by mouth at bedtime.      . montelukast (SINGULAIR) 10 MG tablet Take 10 mg by mouth at bedtime.      . Multiple Vitamins-Minerals (CERTAVITE  SENIOR/ANTIOXIDANT PO) Take 1 tablet by mouth every morning.      . nitroGLYCERIN (NITROSTAT) 0.4 MG SL tablet Place 0.4 mg under the tongue every 5 (five) minutes as needed for chest pain.      Marland Kitchen omeprazole (PRILOSEC) 20 MG capsule Take 20 mg by mouth every morning.       Marland Kitchen oxyCODONE (OXY IR/ROXICODONE) 5 MG immediate release tablet Take 5 mg by mouth every 4 (four) hours as needed for severe pain.      . promethazine (PHENERGAN) 12.5 MG tablet Take 12.5 mg by mouth every 6 (six) hours as needed for nausea or vomiting.      . Rivaroxaban (XARELTO) 15 MG TABS tablet Take 15 mg by mouth daily.      . sertraline (ZOLOFT) 50 MG tablet Take 50 mg by mouth daily.       No facility-administered medications prior  to visit.   Allergies  Allergen Reactions  . Sulfonamide Derivatives Other (See Comments)    Pt is unsure of reaction   Patient Active Problem List   Diagnosis Date Noted  . Loss of weight 08/27/2013  . S/P balloon dilatation of esophageal stricture 07/28/2013  . Candida esophagitis 07/28/2013  . Esophageal dysmotility 07/26/2013  . Foreign body in esophagus 07/26/2013  . Nonspecific (abnormal) findings on radiological and other examination of gastrointestinal tract 07/26/2013  . Fall 06/25/2013  . Protein-calorie malnutrition, severe 11/24/2012  . Dry skin dermatitis 11/18/2012  . Dementia 09/16/2012  . Hip fracture 09/08/2012  . Deep vein thrombosis of left lower extremity 07/19/2012  . Anxiety and depression 03/12/2012  . DVT (deep venous thrombosis) 07/13/2010  . Edema of leg 07/01/2010  . Left leg DVT 07/01/2010  . Dysphagia, unspecified(787.20) 05/27/2010  . Essential thrombocythemia 07/30/2009  . SYNCOPE 07/30/2009  . MACULAR DEGENERATION 01/26/2008  . CORONARY ARTERY DISEASE 03/31/2007  . COLONIC POLYPS 03/12/2007  . HYPERCHOLESTEROLEMIA 03/12/2007  . Essential hypertension 03/12/2007  . 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 GERD 03/12/2007  . DEGENERATIVE JOINT DISEASE 03/12/2007  . BACK PAIN, LUMBAR 03/12/2007  . OSTEOPOROSIS 03/12/2007  . TRANSIENT ISCHEMIC ATTACKS, HX OF 03/12/2007   History  Substance Use Topics  . Smoking status: Never Smoker   . Smokeless tobacco: Never Used  . Alcohol Use: No    family history includes Cancer in her maternal aunt; Colon cancer (age of onset: 44) in her son; Crohn's disease in her son.  Objective:   Physical Exam well-developed elderly white female thin frail-appearing sitting in a wheelchair blood pressure 110/60 pulse 64. HEENT; nontraumatic normocephalic EOMI PERRLA sclera anicteric, Supple ;no JVD, Cardiovascular; regular rate and rhythm with S1-S2 she does soft systolic murmur, Pulmonary; clear bilaterally she does have a  significant kyphosis, Abdomen ;soft nontender nondistended bowel sounds are active, Rectal ;exam not done, Extremities ;no clubbing cyanosis or edema skin warm dry, Psych; mood and affect normal and appropriate she is hard of hearing        Assessment & Plan:  #66  80 year old female with new diagnosis of achalasia with significant solid and liquid dysphagia and intermittent regurgitation and associated weight loss #2 chronic anticoagulation with Xarelto #3 coronary artery disease #4 history of TIAs #5 history of DVT #6 COPD #7 dementia #8 recent candidiasis of the esophagus treated  Plan; long discussion with patient and her son regarding options including repeat EGD with Botox injections. At her advanced age she is very poor surgical candidate. They are agreeable to EGD with Botox and  this is scheduled with Dr. [name] Patient will need to hold Xarelto for 24 hours prior to the procedure. We will obtain consent from her PCP to hold Xarelto.

## 2013-09-18 NOTE — Op Note (Signed)
[facility] [place], [address]   ENDOSCOPY PROCEDURE REPORT  PATIENT: Sherri, Crawford  MR#: [id number] BIRTHDATE: 04/06/1920 , 89  yrs. old GENDER: Female ENDOSCOPIST: Lafayette Dragon, MD REFERRED BY:  Man [name] X,NP PROCEDURE DATE:  09/18/2013 PROCEDURE:  EGD, diagnostic and EGD w/ directed submucosal injection(s), any substance ASA CLASS:     Class III INDICATIONS:  Dysphagia.   hx of recent food impaction, known esophageal dismotility- suspected achalasia as per recent manometry,. MEDICATIONS: MAC sedation, administered by CRNA TOPICAL ANESTHETIC: none  DESCRIPTION OF PROCEDURE: After the risks benefits and alternatives of the procedure were thoroughly explained, informed consent was obtained.  The    endoscope was introduced through the mouth and advanced to the second portion of the duodenum. Without limitations.  The instrument was slowly withdrawn as the mucosa was fully examined.      Esophagus[  : Spastic UES had to be relaxed with passage of 11F Maloney dilator, then  endoscope able to traverse into esophagus which appeared dilated and full of white thick secretions, suggestive of Candida esophagitis. Esophageal lumen was tortuous, the LES was closed but opened with pressure of the scope. No hiatal hernia, no stricture. Savory dilator 16 mm attemted to pass but too much resistence in the upper esophagus prevented the passage Stomach: normal gastric mucosa, no gastritis, retroflexion showed normal fundus, gastric outlet is normal Duodenul normal bulb and the descending duodenum.        The scope was then withdrawn from the patient and the procedure completed. Botox injection was carried out in 4 quadrant distribution of 25 units per quadrant, total of 100 units injected at the LES submucosally,  COMPLICATIONS: There were no complications. ENDOSCOPIC IMPRESSION:  Dilated aperistaltic esophagus with retained secretions Spasm of  UES Suspected Candida esophagitis Spastic LES c/w achalasia-type abnormality, s/p injection of Botox 100u  submuc, in 4 quadrants  RECOMMENDATIONS:  Resume soft diet Resume Xarelto Begin trial of Procardia 10 mg, sublingually before meals  REPEAT EXAM: prn  eSigned:  Lafayette Dragon, MD 09/18/2013 12:08 PM   CC:  PATIENT NAME:  Sherri Crawford, Sherri Crawford MR#: [id number]

## 2013-09-18 NOTE — Transfer of Care (Signed)
Immediate Anesthesia Transfer of Care Note  Patient: Sherri Crawford  Procedure(s) Performed: Procedure(s) (LRB): ESOPHAGOGASTRODUODENOSCOPY (EGD) WITH PROPOFOL (N/A) BOTOX INJECTION (N/A)  Patient Location: PACU  Anesthesia Type: MAC  Level of Consciousness: sedated, patient cooperative and responds to stimulation  Airway & Oxygen Therapy: Patient Spontanous Breathing and Patient connected to face mask oxgen  Post-op Assessment: Report given to PACU RN and Post -op Vital signs reviewed and stable  Post vital signs: Reviewed and stable  Complications: No apparent anesthesia complications

## 2013-09-18 NOTE — Discharge Instructions (Signed)
Gastrointestinal Endoscopy, Care After °Refer to this sheet in the next few weeks. These instructions provide you with information on caring for yourself after your procedure. Your caregiver may also give you more specific instructions. Your treatment has been planned according to current medical practices, but problems sometimes occur. Call your caregiver if you have any problems or questions after your procedure. °HOME CARE INSTRUCTIONS °· If you were given medicine to help you relax (sedative), do not drive, operate machinery, or sign important documents for 24 hours. °· Avoid alcohol and hot or warm beverages for the first 24 hours after the procedure. °· Only take over-the-counter or prescription medicines for pain, discomfort, or fever as directed by your caregiver. You may resume taking your normal medicines unless your caregiver tells you otherwise. Ask your caregiver when you may resume taking medicines that may cause bleeding, such as aspirin, clopidogrel, or warfarin. °· You may return to your normal diet and activities on the day after your procedure, or as directed by your caregiver. Walking may help to reduce any bloated feeling in your abdomen. °· Drink enough fluids to keep your urine clear or pale yellow. °· You may gargle with salt water if you have a sore throat. °SEEK IMMEDIATE MEDICAL CARE IF: °· You have severe nausea or vomiting. °· You have severe abdominal pain, abdominal cramps that last longer than 6 hours, or abdominal swelling (distention). °· You have severe shoulder or back pain. °· You have trouble swallowing. °· You have shortness of breath, your breathing is shallow, or you are breathing faster than normal. °· You have a fever or a rapid heartbeat. °· You vomit blood or material that looks like coffee grounds. °· You have bloody, black, or tarry stools. °MAKE SURE YOU: °· Understand these instructions. °· Will watch your condition. °· Will get help right away if you are not doing  well or get worse. °Document Released: 09/28/2003 Document Revised: 06/30/2013 Document Reviewed: 05/16/2011 °Exit[employer]® Patient Information ©2015 [employer]. This information is not intended to replace advice given to you by your health care provider. Make sure you discuss any questions you have with your health care provider. ° °

## 2013-09-18 NOTE — Anesthesia Preprocedure Evaluation (Signed)
Anesthesia Evaluation  Patient identified by MRN, date of birth, ID band Patient awake    Reviewed: Allergy & Precautions, H&P , NPO status , Patient's Chart, lab work & pertinent test results  Airway Mallampati: II TM Distance: >3 FB Neck ROM: Full    Dental no notable dental hx.    Pulmonary asthma , COPD COPD inhaler,  breath sounds clear to auscultation  Pulmonary exam normal       Cardiovascular Exercise Tolerance: Poor hypertension, Pt. on medications and Pt. on home beta blockers + CAD and + Peripheral Vascular Disease Rhythm:Regular Rate:Normal     Neuro/Psych PSYCHIATRIC DISORDERS Anxiety Depression  Neuromuscular disease    GI/Hepatic Neg liver ROS, GERD-  ,  Endo/Other  negative endocrine ROS  Renal/GU negative Renal ROS  negative genitourinary   Musculoskeletal negative musculoskeletal ROS (+)   Abdominal   Peds negative pediatric ROS (+)  Hematology negative hematology ROS (+)   Anesthesia Other Findings   Reproductive/Obstetrics negative OB ROS                           Anesthesia Physical Anesthesia Plan  ASA: III  Anesthesia Plan: MAC   Post-op Pain Management:    Induction: Intravenous  Airway Management Planned:   Additional Equipment:   Intra-op Plan:   Post-operative Plan:   Informed Consent: I have reviewed the patients History and Physical, chart, labs and discussed the procedure including the risks, benefits and alternatives for the proposed anesthesia with the patient or authorized representative who has indicated his/her understanding and acceptance.   Dental advisory given  Plan Discussed with: CRNA  Anesthesia Plan Comments:         Anesthesia Quick Evaluation

## 2013-09-18 NOTE — Anesthesia Postprocedure Evaluation (Signed)
  Anesthesia Post-op Note  Patient: Sherri Crawford  Procedure(s) Performed: Procedure(s) (LRB): ESOPHAGOGASTRODUODENOSCOPY (EGD) WITH PROPOFOL (N/A) BOTOX INJECTION (N/A)  Patient Location: PACU  Anesthesia Type: MAC  Level of Consciousness: awake and alert   Airway and Oxygen Therapy: Patient Spontanous Breathing  Post-op Pain: mild  Post-op Assessment: Post-op Vital signs reviewed, Patient's Cardiovascular Status Stable, Respiratory Function Stable, Patent Airway and No signs of Nausea or vomiting  Last Vitals:  Filed Vitals:   09/18/13 1143  BP: 182/65  Pulse: 69  Temp: 37 C  Resp: 15    Post-op Vital Signs: stable   Complications: No apparent anesthesia complications

## 2013-09-19 ENCOUNTER — Encounter (HOSPITAL_COMMUNITY): Payer: Self-pay | Admitting: Internal Medicine

## 2013-09-22 ENCOUNTER — Other Ambulatory Visit: Payer: Self-pay | Admitting: Nurse Practitioner

## 2013-09-24 NOTE — Telephone Encounter (Signed)
error 

## 2013-09-29 ENCOUNTER — Encounter: Payer: Self-pay | Admitting: Internal Medicine

## 2013-10-02 ENCOUNTER — Encounter: Payer: Self-pay | Admitting: Nurse Practitioner

## 2013-10-02 ENCOUNTER — Non-Acute Institutional Stay (SKILLED_NURSING_FACILITY): Payer: Medicare Other | Admitting: Nurse Practitioner

## 2013-10-02 DIAGNOSIS — F0390 Unspecified dementia without behavioral disturbance: Secondary | ICD-10-CM

## 2013-10-02 DIAGNOSIS — M545 Low back pain, unspecified: Secondary | ICD-10-CM

## 2013-10-02 DIAGNOSIS — J4489 Other specified chronic obstructive pulmonary disease: Secondary | ICD-10-CM

## 2013-10-02 DIAGNOSIS — F341 Dysthymic disorder: Secondary | ICD-10-CM

## 2013-10-02 DIAGNOSIS — B3781 Candidal esophagitis: Secondary | ICD-10-CM

## 2013-10-02 DIAGNOSIS — R1310 Dysphagia, unspecified: Secondary | ICD-10-CM

## 2013-10-02 DIAGNOSIS — F329 Major depressive disorder, single episode, unspecified: Secondary | ICD-10-CM

## 2013-10-02 DIAGNOSIS — F32A Depression, unspecified: Secondary | ICD-10-CM

## 2013-10-02 DIAGNOSIS — I10 Essential (primary) hypertension: Secondary | ICD-10-CM

## 2013-10-02 DIAGNOSIS — J449 Chronic obstructive pulmonary disease, unspecified: Secondary | ICD-10-CM

## 2013-10-02 DIAGNOSIS — F419 Anxiety disorder, unspecified: Secondary | ICD-10-CM

## 2013-10-02 DIAGNOSIS — D473 Essential (hemorrhagic) thrombocythemia: Secondary | ICD-10-CM

## 2013-10-02 DIAGNOSIS — K219 Gastro-esophageal reflux disease without esophagitis: Secondary | ICD-10-CM

## 2013-10-02 NOTE — Assessment & Plan Note (Signed)
Managed with Fluticasone-Salmeterol 250/50 bid, dc'd Epipen prn and Immunotherapy per [facility] 01/17/13, Montelukast 10mg  daily, and Albuterol II puffs q6hr prn. Stable

## 2013-10-02 NOTE — Assessment & Plan Note (Signed)
takes Omeprazole 20mg  daily. Complicated with achalasia and candidiasis.

## 2013-10-02 NOTE — Assessment & Plan Note (Signed)
New dx of achalasia 6/ 2015 09/18/13 Nifedipine 10mg  tid ac meals.

## 2013-10-02 NOTE — Assessment & Plan Note (Signed)
Hx of lower back pain, had X-ray of her lumbar spine 09/08/12--spondylosis-better controlled with Hydrocodone/ApAp  10/325mg  ac and hs  01/01/13 lumbar spine inj. Able to ambulate with walker on unit until her fall 06/22/13. 06/25/13 unremarkable. X-ray lumbar spine. continue Oxycodone 5mg  q4hr prn for better pain control. Continue Norco 10/325mg  qid. F/u [name].

## 2013-10-02 NOTE — Assessment & Plan Note (Signed)
09/18/13 [facility]: Diflucan 100mg  daily x12.

## 2013-10-02 NOTE — Assessment & Plan Note (Signed)
Controlled on Metoprolol 25mg  bid.

## 2013-10-02 NOTE — Assessment & Plan Note (Signed)
Stabilized. Increased Sertraline to 50mg  daily 07/16/13. 08/26/13 Mirtazapine 7.5mg  qhs

## 2013-10-02 NOTE — Progress Notes (Signed)
Patent ID: Sherri Crawford, female   DOB: Jul 25, 1920, 80 y.o.   MRN: [id number]    Patient ID: Sherri Crawford, female   DOB: 1920/03/26, 80 y.o.   MRN: [id number]  Code Status: DNR  Allergies  Allergen Reactions  . Sulfonamide Derivatives Other (See Comments)    Pt is unsure of reaction    Chief Complaint  Patient presents with  . Medical Management of Chronic Issues    HPI: Patient is a 80 y.o. female seen in the SNF at [facility] today for evaluation of chronic medical conditions.    ED 07/26/13 for EGD foreign body removal/Balloon dilation of esophagus. Her EKG shows no new ischemic changes and her troponin is negative. CT scan of her chest shows no sign of pulmonary embolus but there is distal esophageal food impaction  No further chest pain, nausea, or vomiting since s/p EGD foreign body removal and Balloon of esophagus dilation. Also she was found to have candida esophagitis-completed Diflucan 100mg  daily x5 today.   Problem List Items Addressed This Visit   Essential thrombocythemia     /[facility], takes Hydrea 1000mg  Monday, 500mg  Wed, Fri, Sun. Plt 413 12/05/12 and 264 05/15/13.Chronic anticoagulation with Xarelto. 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       COPD with asthma     Managed with Fluticasone-Salmeterol 250/50 bid, dc'd Epipen prn and Immunotherapy per [facility] 01/17/13, Montelukast 10mg  daily, and Albuterol II puffs q6hr prn. Stable     GERD     takes Omeprazole 20mg  daily. Complicated with achalasia and candidiasis.       BACK PAIN, LUMBAR     Hx of lower back pain, had X-ray of her lumbar spine 09/08/12--spondylosis-better controlled with Hydrocodone/ApAp  10/325mg  ac and hs  01/01/13 lumbar spine inj. Able to ambulate with walker on unit until her fall 06/22/13. 06/25/13 unremarkable. X-ray lumbar spine. continue Oxycodone 5mg   q4hr prn for better pain control. Continue Norco 10/325mg  qid. F/u [name].       Dysphagia, unspecified(787.20) - Primary     New dx of achalasia 6/ 2015 09/18/13 Nifedipine 10mg  tid ac meals.      Anxiety and depression     Stabilized. Increased Sertraline to 50mg  daily 07/16/13. 08/26/13 Mirtazapine 7.5mg  qhs     Dementia     Presently taking Namenda daily and SNF for care needs      Candida esophagitis     09/18/13 [facility]: Diflucan 100mg  daily x12.        Review of Systems:  Review of Systems  Constitutional: Negative for fever, chills, weight loss, malaise/fatigue and diaphoresis.       # 95-96 in the past 2 months.   HENT: Positive for hearing loss. Negative for congestion, ear discharge, ear pain, nosebleeds, sore throat and tinnitus.   Eyes: Negative for blurred vision, double vision, photophobia, pain, discharge and redness.  Respiratory: Positive for cough. Negative for hemoptysis, sputum production, shortness of breath, wheezing (occasional. ) and stridor.   Cardiovascular: Negative for chest pain, palpitations, orthopnea, claudication and PND. Leg swelling: trace RLE-resolved.   Gastrointestinal: Negative for heartburn, nausea, vomiting, abdominal pain, diarrhea, constipation, blood in stool and melena.       Dysphagia.   Genitourinary: Positive for frequency. Negative for dysuria, urgency, hematuria and flank pain.  Musculoskeletal: Positive for falls (recent and resulted in the  right hip fx. ) and joint pain (right hip). Negative for myalgias and neck pain. Back pain: chronic back pain is managed with Norco.       Ambulates with walker. Hx of lower back pain: had multiple inj and presently taking Norco 5xday. Worsened lower back pain since her fall 06/22/13-better now  Skin: Negative for itching and rash.       Itching scaly mid of R+L shins.   Neurological: Negative for dizziness, tingling, tremors, sensory change, speech change, focal weakness, seizures, loss of  consciousness, weakness and headaches.  Endo/Heme/Allergies: Positive for environmental allergies. Negative for polydipsia. Bruises/bleeds easily.  Psychiatric/Behavioral: Positive for depression and memory loss. Negative for suicidal ideas, hallucinations and substance abuse. The patient is nervous/anxious. The patient does not have insomnia.      Past Medical History  Diagnosis Date  . Macular degeneration   . COPD (chronic obstructive pulmonary disease)   . Hypertension   . Coronary artery disease   . Hypercholesterolemia   . GERD (gastroesophageal reflux disease)   . Asthmatic bronchitis   . History of colonic polyps   . DJD (degenerative joint disease)   . Lumbar back pain   . Osteoporosis   . History of transient ischemic attack (TIA)   . Anxiety   . Essential thrombocythemia   . Diverticulosis 05/10/2012  . Osteoarthrosis, unspecified whether generalized or localized, unspecified site 05/09/2012  . Memory loss 05/09/2012  . Abnormal weight gain 05/09/2012  . Phlebitis and thrombophlebitis of other deep vessels of lower extremities 07/10/2010  . Essential thrombocythemia 06/09/2009  . Depression   . Esophageal stricture    Past Surgical History  Procedure Laterality Date  . Appendectomy    . Cholecystectomy    . Tah and bso  08/21/1995  . Cataract extraction w/ intraocular lens  implant, bilateral    . Abdominal hysterectomy    . Hip arthroplasty Right 09/09/2012    Procedure: ARTHROPLASTY BIPOLAR HIP;  Surgeon: Mauri Pole, MD;  Location: [facility];  Service: Orthopedics;  Laterality: Right;  . Esophagogastroduodenoscopy (egd) with esophageal dilation N/A 11/25/2012    Procedure: ESOPHAGOGASTRODUODENOSCOPY (EGD) WITH ESOPHAGEAL DILATION;  Surgeon: Jerene Bears, MD;  Location: [facility] ENDOSCOPY;  Service: Gastroenterology;  Laterality: N/A;  . Esophagogastroduodenoscopy N/A 07/26/2013    Procedure: ESOPHAGOGASTRODUODENOSCOPY (EGD);  Surgeon: Irene Shipper, MD;  Location: [facility]  ENDOSCOPY;  Service: Endoscopy;  Laterality: N/A;  . Esophageal manometry N/A 08/25/2013    Procedure: ESOPHAGEAL MANOMETRY (EM);  Surgeon: Jerene Bears, MD;  Location: [facility] ENDOSCOPY;  Service: Gastroenterology;  Laterality: N/A;  . Esophagogastroduodenoscopy (egd) with propofol N/A 09/18/2013    Procedure: ESOPHAGOGASTRODUODENOSCOPY (EGD) WITH PROPOFOL;  Surgeon: Lafayette Dragon, MD;  Location: [facility] ENDOSCOPY;  Service: Endoscopy;  Laterality: N/A;  . Botox injection N/A 09/18/2013    Procedure: BOTOX INJECTION;  Surgeon: Lafayette Dragon, MD;  Location: [facility] ENDOSCOPY;  Service: Endoscopy;  Laterality: N/A;   Social History:   reports that she has never smoked. She has never used smokeless tobacco. She reports that she does not drink alcohol or use illicit drugs.  Family History  Problem Relation Age of Onset  . Colon cancer Son 94  . Crohn's disease Son   . Cancer Maternal Aunt     Medications: Patient's Medications  New Prescriptions   No medications on file  Previous Medications   ALBUTEROL (PROVENTIL HFA;VENTOLIN HFA) 108 (90 BASE) MCG/ACT INHALER    Inhale 2 puffs into the lungs every 6 (six)  hours as needed for wheezing or shortness of breath.   ATORVASTATIN (LIPITOR) 20 MG TABLET    Take 20 mg by mouth every evening.    CHOLECALCIFEROL (VITAMIN D) 1000 UNITS TABLET    Take 1,000 Units by mouth every morning.   FEEDING SUPPLEMENT (RESOURCE BREEZE) LIQD    Take 90 mLs by mouth 4 (four) times daily.    FLUTICASONE-SALMETEROL (ADVAIR) 250-50 MCG/DOSE AEPB    Inhale 1 puff into the lungs every 12 (twelve) hours.   HYDROCODONE-ACETAMINOPHEN (NORCO) 10-325 MG PER TABLET    Take 1 tablet by mouth 4 (four) times daily.   HYDROXYUREA (HYDREA) 500 MG CAPSULE    Take 500-1,000 mg by mouth See admin instructions. Take 1000 mg on Monday and 500 mg on Sunday, Wednesday and Friday   LORAZEPAM (ATIVAN) 0.5 MG TABLET    Take 0.5 mg by mouth every 4 (four) hours as needed for anxiety (and nausea).     MEMANTINE HCL ER (NAMENDA XR) 14 MG CP24    Take 14 mg by mouth daily.   METOPROLOL TARTRATE (LOPRESSOR) 25 MG TABLET    Take 25 mg by mouth 2 (two) times daily.   MIRTAZAPINE (REMERON) 7.5 MG TABLET    Take 7.5 mg by mouth at bedtime.   MONTELUKAST (SINGULAIR) 10 MG TABLET    Take 10 mg by mouth at bedtime.   MULTIPLE VITAMINS-MINERALS (CERTAVITE SENIOR/ANTIOXIDANT PO)    Take 1 tablet by mouth every morning.   NIFEDIPINE (PROCARDIA) 10 MG CAPSULE    Take 1 capsule (10 mg total) by mouth 3 (three) times daily before meals.   NITROGLYCERIN (NITROSTAT) 0.4 MG SL TABLET    Place 0.4 mg under the tongue every 5 (five) minutes as needed for chest pain.   OMEPRAZOLE (PRILOSEC) 20 MG CAPSULE    Take 20 mg by mouth every morning.    OXYCODONE (OXY IR/ROXICODONE) 5 MG IMMEDIATE RELEASE TABLET    Take 5 mg by mouth every 4 (four) hours as needed for severe pain.   PROMETHAZINE (PHENERGAN) 12.5 MG SUPPOSITORY    Place 12.5 mg rectally every 6 (six) hours as needed for nausea or vomiting.   PROMETHAZINE (PHENERGAN) 12.5 MG TABLET    Take 12.5 mg by mouth every 6 (six) hours as needed for nausea or vomiting.   RIVAROXABAN (XARELTO) 15 MG TABS TABLET    Take 15 mg by mouth daily.   SERTRALINE (ZOLOFT) 50 MG TABLET    Take 50 mg by mouth every evening.   Modified Medications   No medications on file  Discontinued Medications   No medications on file     Physical Exam: Physical Exam  Constitutional: She is oriented to person, place, and time. She appears well-developed and well-nourished. No distress.  HENT:  Head: Normocephalic and atraumatic.  Eyes: Conjunctivae and EOM are normal. Pupils are equal, round, and reactive to light.  Neck: Normal range of motion. Neck supple. No JVD present. No thyromegaly present.  Cardiovascular: Normal rate, regular rhythm and normal heart sounds.   Pulmonary/Chest: Effort normal. She has decreased breath sounds. She has no wheezes. She has no rales.  Decreased breath  sounds Bilat. Lungs.   Abdominal: Soft. Bowel sounds are normal. There is no tenderness.  Musculoskeletal: Normal range of motion. She exhibits tenderness (chronic lower back and the right hip since the fx. better controloled with scheduled Norco. ). She exhibits no edema.  Lower back.  Lymphadenopathy:    She has no cervical adenopathy.  Neurological: She is alert and oriented to person, place, and time. She has normal reflexes. No cranial nerve deficit. She exhibits normal muscle tone. Coordination normal.  Skin: Skin is warm and dry. No rash noted. She is not diaphoretic. No erythema (LLE).  The right hip surgical incision healed.   Psychiatric: Her mood appears anxious. Her affect is not angry, not blunt, not labile and not inappropriate. Her speech is not delayed, not tangential and not slurred. She is hyperactive and slowed. She is not agitated, not aggressive, not withdrawn and not combative. Thought content is not paranoid and not delusional. Cognition and memory are impaired. She does not express impulsivity or inappropriate judgment. She exhibits a depressed mood. She exhibits abnormal recent memory.    Filed Vitals:   10/02/13 1225  BP: 132/80  Pulse: 78  Temp: 98.6 F (37 C)  TempSrc: Tympanic  Resp: 20      Labs reviewed: Basic Metabolic Panel:  Recent Labs  10/18/12  11/22/12 1800 11/22/12 2249 11/23/12 0435  05/27/13 1446 07/26/13 1131 08/28/13  NA  --   < > 141  --  143  < > 140 144 141  K  --   < > 3.5  --  3.6  < > 4.5 4.0 4.2  CL  --   --  101  --  106  --   --  104  --   CO2  --   < > 24  --  22  --  28 23  --   GLUCOSE  --   < > 83  --  69*  --  121 93  --   BUN  --   < > 17  --  14  < > 22.8 23 17   CREATININE  --   < > 0.74  --  0.75  < > 1.1 0.84 0.8  CALCIUM  --   < > 9.9  --  8.5  --  9.5 10.0  --   TSH 2.81  --   --  0.129*  --   --   --   --  1.32  < > = values in this interval not displayed. Liver Function Tests:  Recent Labs  11/22/12 1800  11/23/12 0435 05/15/13 05/27/13 1446 08/28/13  AST 23 22 16 19 18   ALT 12 11 10 11 12   ALKPHOS 81 73 47 63 56  BILITOT 0.5 0.5  --  0.33  --   PROT 7.1 6.1  --  6.5  --   ALBUMIN 3.7 3.0*  --  3.6  --    CBC:  Recent Labs  11/23/12 0435  11/26/12 0526  05/27/13 1549  07/26/13 1131 08/21/13 09/18/13  WBC 8.4  < > 7.1  < > 5.7  < > 7.9 4.9 6.2  NEUTROABS 6.9  --   --   --  3.7  --  7.1  --   --   HGB 12.4  < > 11.2*  < > 12.6  < > 12.7 11.4* 11.5*  HCT 39.1  < > 35.0*  < > 37.9  < > 39.4 35* 36  MCV 110.1*  < > 106.7*  --  110.1*  --  112.6*  --   --   PLT 316  < > 291  < > 332  < > 306 361 437*  < > = values in this interval not displayed.  Past Procedures:  09/09/12  CT head:  IMPRESSION: No skull  fracture or intracranial hemorrhage.  Small vessel disease type changes without CT evidence of large acute infarct.  X-ray right hip  IMPRESSION: Right femoral prosthesis without acute complication  X-ray Lumbar spine  IMPRESSION: Diffuse lumbar spondylosis.  No evidence of fracture.   X--ray R hip  IMPRESSION: Displaced right subcapital hip fracture.  11/25/12 [facility]: Esophageal dysmotility w/o definitive stricture. S/p empiric dilation in the lower third of the esophagus and across GE junction with TTS balloon to 4mm. Recommended diet as tolerated.   06/25/13 X-ray lumbar spine: disc disease, slight rotoscoliosis curvature to the left, the is a right hip prosthesis present, there is an eggshell calcification in the right upper quadrant of uncertain etiology.    Assessment/Plan Dysphagia, unspecified(787.20) New dx of achalasia 6/ 2015 09/18/13 Nifedipine 10mg  tid ac meals.    Candida esophagitis 09/18/13 [facility]: Diflucan 100mg  daily x12.   Anxiety and depression Stabilized. Increased Sertraline to 50mg  daily 6/41/58. 08/26/13 Mirtazapine 7.5mg  qhs   BACK PAIN, LUMBAR Hx of lower back pain, had X-ray of her lumbar spine 09/08/12--spondylosis-better  controlled with Hydrocodone/ApAp  10/325mg  ac and hs  01/01/13 lumbar spine inj. Able to ambulate with walker on unit until her fall 06/22/13. 06/25/13 unremarkable. X-ray lumbar spine. continue Oxycodone 5mg  q4hr prn for better pain control. Continue Norco 10/325mg  qid. F/u [name].     Essential thrombocythemia /[facility], takes Hydrea 1000mg  Monday, 500mg  Wed, Fri, Sun. Plt 413 12/05/12 and 264 05/15/13.Chronic anticoagulation with Xarelto. 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     COPD with asthma Managed with Fluticasone-Salmeterol 250/50 bid, dc'd Epipen prn and Immunotherapy per [facility] 01/17/13, Montelukast 10mg  daily, and Albuterol II puffs q6hr prn. Stable   GERD takes Omeprazole 20mg  daily. Complicated with achalasia and candidiasis.       Family/ Staff Communication: observe the patient  Goals of Care: SNF  Labs/tests ordered: none

## 2013-10-02 NOTE — Assessment & Plan Note (Signed)
/  [facility], takes Hydrea 1000mg  Monday, 500mg  Wed, Fri, Sun. Plt 413 12/05/12 and 264 05/15/13.Chronic anticoagulation with Xarelto. 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

## 2013-10-02 NOTE — Assessment & Plan Note (Signed)
Presently taking Namenda daily and SNF for care needs

## 2013-10-16 LAB — CBC AND DIFFERENTIAL
HEMATOCRIT: 34 % — AB (ref 36–46)
HEMOGLOBIN: 11 g/dL — AB (ref 12.0–16.0)
PLATELETS: 442 10*3/uL — AB (ref 150–399)
WBC: 7.4 10*3/mL

## 2013-10-20 ENCOUNTER — Other Ambulatory Visit: Payer: Self-pay | Admitting: Nurse Practitioner

## 2013-10-30 ENCOUNTER — Encounter: Payer: Self-pay | Admitting: Nurse Practitioner

## 2013-10-30 ENCOUNTER — Non-Acute Institutional Stay (SKILLED_NURSING_FACILITY): Payer: Medicare Other | Admitting: Nurse Practitioner

## 2013-10-30 DIAGNOSIS — K220 Achalasia of cardia: Secondary | ICD-10-CM

## 2013-10-30 DIAGNOSIS — B3781 Candidal esophagitis: Secondary | ICD-10-CM

## 2013-10-30 DIAGNOSIS — J449 Chronic obstructive pulmonary disease, unspecified: Secondary | ICD-10-CM

## 2013-10-30 DIAGNOSIS — F419 Anxiety disorder, unspecified: Secondary | ICD-10-CM

## 2013-10-30 DIAGNOSIS — I10 Essential (primary) hypertension: Secondary | ICD-10-CM

## 2013-10-30 DIAGNOSIS — F341 Dysthymic disorder: Secondary | ICD-10-CM

## 2013-10-30 DIAGNOSIS — K2100 Gastro-esophageal reflux disease with esophagitis, without bleeding: Secondary | ICD-10-CM

## 2013-10-30 DIAGNOSIS — F32A Depression, unspecified: Secondary | ICD-10-CM

## 2013-10-30 DIAGNOSIS — F329 Major depressive disorder, single episode, unspecified: Secondary | ICD-10-CM

## 2013-10-30 DIAGNOSIS — J4489 Other specified chronic obstructive pulmonary disease: Secondary | ICD-10-CM

## 2013-10-30 HISTORY — DX: Essential (primary) hypertension: I10

## 2013-10-30 NOTE — Assessment & Plan Note (Signed)
Managed with Fluticasone-Salmeterol 250/50 bid, dc'd Epipen prn and Immunotherapy per [facility] 01/17/13, Montelukast 10mg  daily, and Albuterol II puffs q6hr prn. Stable

## 2013-10-30 NOTE — Assessment & Plan Note (Signed)
Controlled. Diflucan 100mg  po q week

## 2013-10-30 NOTE — Assessment & Plan Note (Signed)
Walking with walker as tolerated, continues with w/c prn. Continue Oxycodone 5mg  q4hr prn for better pain control. Continue Norco 10/325mg  qid. F/u [name].

## 2013-10-30 NOTE — Assessment & Plan Note (Signed)
Stabilized. Sertraline to 50mg daily 07/16/13. 08/26/13 Mirtazapine 7.5mg qhs   

## 2013-10-30 NOTE — Assessment & Plan Note (Signed)
Stable. Omeprazole 20mg  daily.

## 2013-10-30 NOTE — Progress Notes (Signed)
Patient ID: Sherri Crawford, female   DOB: 1921-01-03, 80 y.o.   MRN: [id number]   Code Status: DNR  Allergies  Allergen Reactions  . Sulfonamide Derivatives Other (See Comments)    Pt is unsure of reaction    Chief Complaint  Patient presents with  . Medical Management of Chronic Issues    HPI: Patient is a 80 y.o. female seen in the SNF at [facility] today for evaluation of chronic medical conditions.  Problem List Items Addressed This Visit   GERD     Stable. Omeprazole 20mg  daily.         Essential hypertension, benign   Essential hypertension     On Metoprolol 25mg  bid. Last BP 162/80. Pt asymptomatic. Will monitor BP q d x 1 week and reassess.        COPD with asthma     Managed with Fluticasone-Salmeterol 250/50 bid, dc'd Epipen prn and Immunotherapy per [facility] 01/17/13, Montelukast 10mg  daily, and Albuterol II puffs q6hr prn. Stable       Candida esophagitis     Controlled. Diflucan 100mg  po q week      Anxiety and depression - Primary     Stabilized. Sertraline to 50mg  daily 07/16/13. 08/26/13 Mirtazapine 7.5mg  qhs       Achalasia of esophagus     Stable. Nifedipine 10mg  tid ac meals.       Review of Systems:  Review of Systems  Constitutional: Negative for fever, chills, weight loss, malaise/fatigue and diaphoresis.       # 95-96 in the past 2 months.   HENT: Positive for hearing loss. Negative for congestion, ear discharge, ear pain, nosebleeds, sore throat and tinnitus.   Eyes: Negative for blurred vision, double vision, photophobia, pain, discharge and redness.  Respiratory: Negative for cough, hemoptysis, sputum production, shortness of breath, wheezing (occasional. ) and stridor.   Cardiovascular: Negative for chest pain, palpitations, orthopnea, claudication and PND. Leg swelling: trace RLE-resolved.   Gastrointestinal: Negative for heartburn, nausea, vomiting, abdominal pain, diarrhea, constipation, blood in stool and  melena.       Dysphagia.   Genitourinary: Positive for frequency. Negative for dysuria, urgency, hematuria and flank pain.  Musculoskeletal: Positive for falls (recent and resulted in the right hip fx. ) and joint pain (right hip). Negative for myalgias and neck pain. Back pain: chronic back pain is managed with Norco.       Ambulates with walker. Hx of lower back pain: had multiple inj and presently taking Norco 5xday. Worsened lower back pain since her fall 06/22/13-better now  Skin: Negative for itching and rash.          Neurological: Negative for dizziness, tingling, tremors, sensory change, speech change, focal weakness, seizures, loss of consciousness, weakness and headaches.  Endo/Heme/Allergies: Positive for environmental allergies. Negative for polydipsia. Bruises/bleeds easily.  Psychiatric/Behavioral: Positive for depression and memory loss. Negative for suicidal ideas, hallucinations and substance abuse. The patient is nervous/anxious. The patient does not have insomnia.      Past Medical History  Diagnosis Date  . Macular degeneration   . COPD (chronic obstructive pulmonary disease)   . Hypertension   . Coronary artery disease   . Hypercholesterolemia   . GERD (gastroesophageal reflux disease)   . Asthmatic bronchitis   . History of colonic polyps   . DJD (degenerative joint disease)   . Lumbar back pain   . Osteoporosis   . History of transient ischemic attack (TIA)   .  Anxiety   . Essential thrombocythemia   . Diverticulosis 05/10/2012  . Osteoarthrosis, unspecified whether generalized or localized, unspecified site 05/09/2012  . Memory loss 05/09/2012  . Abnormal weight gain 05/09/2012  . Phlebitis and thrombophlebitis of other deep vessels of lower extremities 07/10/2010  . Essential thrombocythemia 06/09/2009  . Depression   . Esophageal stricture    Past Surgical History  Procedure Laterality Date  . Appendectomy    . Cholecystectomy    . Tah and bso  08/21/1995    . Cataract extraction w/ intraocular lens  implant, bilateral    . Abdominal hysterectomy    . Hip arthroplasty Right 09/09/2012    Procedure: ARTHROPLASTY BIPOLAR HIP;  Surgeon: Mauri Pole, MD;  Location: [facility];  Service: Orthopedics;  Laterality: Right;  . Esophagogastroduodenoscopy (egd) with esophageal dilation N/A 11/25/2012    Procedure: ESOPHAGOGASTRODUODENOSCOPY (EGD) WITH ESOPHAGEAL DILATION;  Surgeon: Jerene Bears, MD;  Location: [facility];  Service: Gastroenterology;  Laterality: N/A;  . Esophagogastroduodenoscopy N/A 07/26/2013    Procedure: ESOPHAGOGASTRODUODENOSCOPY (EGD);  Surgeon: Irene Shipper, MD;  Location: [facility];  Service: Endoscopy;  Laterality: N/A;  . Esophageal manometry N/A 08/25/2013    Procedure: ESOPHAGEAL MANOMETRY (EM);  Surgeon: Jerene Bears, MD;  Location: [facility];  Service: Gastroenterology;  Laterality: N/A;  . Esophagogastroduodenoscopy (egd) with propofol N/A 09/18/2013    Procedure: ESOPHAGOGASTRODUODENOSCOPY (EGD) WITH PROPOFOL;  Surgeon: Lafayette Dragon, MD;  Location: [facility];  Service: Endoscopy;  Laterality: N/A;  . Botox injection N/A 09/18/2013    Procedure: BOTOX INJECTION;  Surgeon: Lafayette Dragon, MD;  Location: [facility];  Service: Endoscopy;  Laterality: N/A;   Social History:   reports that she has never smoked. She has never used smokeless tobacco. She reports that she does not drink alcohol or use illicit drugs.  Family History  Problem Relation Age of Onset  . Colon cancer Son 34  . Crohn's disease Son   . Cancer Maternal Aunt     Medications: Patient's Medications  New Prescriptions   No medications on file  Previous Medications   ALBUTEROL (PROVENTIL HFA;VENTOLIN HFA) 108 (90 BASE) MCG/ACT INHALER    Inhale 2 puffs into the lungs every 6 (six) hours as needed for wheezing or shortness of breath.   ATORVASTATIN (LIPITOR) 20 MG TABLET    Take 20 mg by mouth every evening.    CHOLECALCIFEROL (VITAMIN D) 1000 UNITS  TABLET    Take 1,000 Units by mouth every morning.   FEEDING SUPPLEMENT (RESOURCE BREEZE) LIQD    Take 90 mLs by mouth 4 (four) times daily.    FLUTICASONE-SALMETEROL (ADVAIR) 250-50 MCG/DOSE AEPB    Inhale 1 puff into the lungs every 12 (twelve) hours.   HYDROCODONE-ACETAMINOPHEN (NORCO) 10-325 MG PER TABLET    Take 1 tablet by mouth 4 (four) times daily.   HYDROXYUREA (HYDREA) 500 MG CAPSULE    Take 500-1,000 mg by mouth See admin instructions. Take 1000 mg on Monday and 500 mg on Sunday, Wednesday and Friday   LORAZEPAM (ATIVAN) 0.5 MG TABLET    Take 0.5 mg by mouth every 4 (four) hours as needed for anxiety (and nausea).    MEMANTINE HCL ER (NAMENDA XR) 14 MG CP24    Take 14 mg by mouth daily.   METOPROLOL TARTRATE (LOPRESSOR) 25 MG TABLET    Take 25 mg by mouth 2 (two) times daily.   MIRTAZAPINE (REMERON) 7.5 MG TABLET    Take 7.5 mg by mouth  at bedtime.   MONTELUKAST (SINGULAIR) 10 MG TABLET    Take 10 mg by mouth at bedtime.   MULTIPLE VITAMINS-MINERALS (CERTAVITE SENIOR/ANTIOXIDANT PO)    Take 1 tablet by mouth every morning.   NIFEDIPINE (PROCARDIA) 10 MG CAPSULE    Take 1 capsule (10 mg total) by mouth 3 (three) times daily before meals.   NITROGLYCERIN (NITROSTAT) 0.4 MG SL TABLET    Place 0.4 mg under the tongue every 5 (five) minutes as needed for chest pain.   OMEPRAZOLE (PRILOSEC) 20 MG CAPSULE    Take 20 mg by mouth every morning.    OXYCODONE (OXY IR/ROXICODONE) 5 MG IMMEDIATE RELEASE TABLET    Take 5 mg by mouth every 4 (four) hours as needed for severe pain.   PROMETHAZINE (PHENERGAN) 12.5 MG SUPPOSITORY    Place 12.5 mg rectally every 6 (six) hours as needed for nausea or vomiting.   PROMETHAZINE (PHENERGAN) 12.5 MG TABLET    Take 12.5 mg by mouth every 6 (six) hours as needed for nausea or vomiting.   RIVAROXABAN (XARELTO) 15 MG TABS TABLET    Take 15 mg by mouth daily.   SERTRALINE (ZOLOFT) 50 MG TABLET    Take 50 mg by mouth every evening.   Modified Medications   No  medications on file  Discontinued Medications   No medications on file     Physical Exam: Physical Exam  Constitutional: She is oriented to person, place, and time. She appears well-developed and well-nourished. No distress.  HENT:  Head: Normocephalic and atraumatic.  Eyes: Conjunctivae and EOM are normal. Pupils are equal, round, and reactive to light.  Neck: Normal range of motion. Neck supple. No JVD present. No thyromegaly present.  Cardiovascular: Normal rate, regular rhythm and normal heart sounds.   Pulmonary/Chest: Effort normal. She has decreased breath sounds. She has no wheezes. She has no rales.  Decreased breath sounds Bilat. Lungs.   Abdominal: Soft. Bowel sounds are normal. There is no tenderness.  Musculoskeletal: Normal range of motion. She exhibits tenderness (chronic lower back and the right hip since the fx. better controloled with scheduled Norco. ). She exhibits no edema.  Lower back.  Lymphadenopathy:    She has no cervical adenopathy.  Neurological: She is alert and oriented to person, place, and time. She has normal reflexes. No cranial nerve deficit. She exhibits normal muscle tone. Coordination normal.  Skin: Skin is warm and dry. No rash noted. She is not diaphoretic. No erythema (LLE).  The right hip surgical incision healed.   Psychiatric: Her mood appears anxious. Her affect is not angry, not blunt, not labile and not inappropriate. Her speech is not delayed, not tangential and not slurred. She is hyperactive and slowed. She is not agitated, not aggressive, not withdrawn and not combative. Thought content is not paranoid and not delusional. Cognition and memory are impaired. She does not express impulsivity or inappropriate judgment. She exhibits a depressed mood. She exhibits abnormal recent memory.    Filed Vitals:   10/16/13 1051  BP: 162/80  Pulse: 76      Labs reviewed: Basic Metabolic Panel:  Recent Labs  11/22/12 1800 11/22/12 2249  11/23/12 0435  05/27/13 1446 07/26/13 1131 08/28/13  NA 141  --  143  < > 140 144 141  K 3.5  --  3.6  < > 4.5 4.0 4.2  CL 101  --  106  --   --  104  --   CO2 24  --  22  --  28 23  --   GLUCOSE 83  --  69*  --  121 93  --   BUN 17  --  14  < > 22.8 23 17   CREATININE 0.74  --  0.75  < > 1.1 0.84 0.8  CALCIUM 9.9  --  8.5  --  9.5 10.0  --   TSH  --  0.129*  --   --   --   --  1.32  < > = values in this interval not displayed. Liver Function Tests:  Recent Labs  11/22/12 1800 11/23/12 0435 05/15/13 05/27/13 1446 08/28/13  AST 23 22 16 19 18   ALT 12 11 10 11 12   ALKPHOS 81 73 47 63 56  BILITOT 0.5 0.5  --  0.33  --   PROT 7.1 6.1  --  6.5  --   ALBUMIN 3.7 3.0*  --  3.6  --    CBC:  Recent Labs  11/23/12 0435  11/26/12 0526  05/27/13 1549  07/26/13 1131 08/21/13 09/18/13 10/16/13  WBC 8.4  < > 7.1  < > 5.7  < > 7.9 4.9 6.2 7.4  NEUTROABS 6.9  --   --   --  3.7  --  7.1  --   --   --   HGB 12.4  < > 11.2*  < > 12.6  < > 12.7 11.4* 11.5* 11.0*  HCT 39.1  < > 35.0*  < > 37.9  < > 39.4 35* 36 34*  MCV 110.1*  < > 106.7*  --  110.1*  --  112.6*  --   --   --   PLT 316  < > 291  < > 332  < > 306 361 437* 442*  < > = values in this interval not displayed.  Past Procedures:  09/09/12  CT head:  IMPRESSION: No skull fracture or intracranial hemorrhage.  Small vessel disease type changes without CT evidence of large acute infarct.  X-ray right hip  IMPRESSION: Right femoral prosthesis without acute complication  X-ray Lumbar spine  IMPRESSION: Diffuse lumbar spondylosis.  No evidence of fracture.   X--ray R hip  IMPRESSION: Displaced right subcapital hip fracture.  11/25/12 GED: Esophageal dysmotility w/o definitive stricture. S/p empiric dilation in the lower third of the esophagus and across GE junction with TTS balloon to 62mm. Recommended diet as tolerated.   06/25/13 X-ray lumbar spine: disc disease, slight rotoscoliosis curvature to the left, the is a  right hip prosthesis present, there is an eggshell calcification in the right upper quadrant of uncertain etiology.    Assessment/Plan Anxiety and depression Stabilized. Sertraline to 50mg  daily 07/16/13. 08/26/13 Mirtazapine 7.5mg  qhs     BACK PAIN, LUMBAR Walking with walker as tolerated, continues with w/c prn. Continue Oxycodone 5mg  q4hr prn for better pain control. Continue Norco 10/325mg  qid. F/u [name].       Candida esophagitis Controlled. Diflucan 100mg  po q week    COPD with asthma Managed with Fluticasone-Salmeterol 250/50 bid, dc'd Epipen prn and Immunotherapy per [facility] 01/17/13, Montelukast 10mg  daily, and Albuterol II puffs q6hr prn. Stable     Achalasia of esophagus Stable. Nifedipine 10mg  tid ac meals.  GERD Stable. Omeprazole 20mg  daily.       Essential hypertension On Metoprolol 25mg  bid. Last BP 162/80. Pt asymptomatic. Will monitor BP q d x 1 week and reassess.        Family/ Staff Communication: observe the patient  Goals of Care: SNF  Labs/tests ordered: none    Past Medical History  Diagnosis Date  . Macular degeneration   . COPD (chronic obstructive pulmonary disease)   . Hypertension   . Coronary artery disease   . Hypercholesterolemia   . GERD (gastroesophageal reflux disease)   . Asthmatic bronchitis   . History of colonic polyps   . DJD (degenerative joint disease)   . Lumbar back pain   . Osteoporosis   . History of transient ischemic attack (TIA)   . Anxiety   . Essential thrombocythemia   . Diverticulosis 05/10/2012  . Osteoarthrosis, unspecified whether generalized or localized, unspecified site 05/09/2012  . Memory loss 05/09/2012  . Abnormal weight gain 05/09/2012  . Phlebitis and thrombophlebitis of other deep vessels of lower extremities 07/10/2010  . Essential thrombocythemia 06/09/2009  . Depression   . Esophageal stricture    Past Surgical History  Procedure Laterality Date  . Appendectomy    .  Cholecystectomy    . Tah and bso  08/21/1995  . Cataract extraction w/ intraocular lens  implant, bilateral    . Abdominal hysterectomy    . Hip arthroplasty Right 09/09/2012    Procedure: ARTHROPLASTY BIPOLAR HIP;  Surgeon: Mauri Pole, MD;  Location: [facility];  Service: Orthopedics;  Laterality: Right;  . Esophagogastroduodenoscopy (egd) with esophageal dilation N/A 11/25/2012    Procedure: ESOPHAGOGASTRODUODENOSCOPY (EGD) WITH ESOPHAGEAL DILATION;  Surgeon: Jerene Bears, MD;  Location: [facility];  Service: Gastroenterology;  Laterality: N/A;  . Esophagogastroduodenoscopy N/A 07/26/2013    Procedure: ESOPHAGOGASTRODUODENOSCOPY (EGD);  Surgeon: Irene Shipper, MD;  Location: [facility] END[facility]COPY;  Service: Endoscopy;  Laterality: N/A;  . Esophageal manometry N/A 08/25/2013    Procedure: ESOPHAGEAL MANOMETRY (EM);  Surgeon: Jerene Bears, MD;  Location: [facility];  Service: Gastroenterology;  Laterality: N/A;  . Esophagogastroduodenoscopy (egd) with propofol N/A 09/18/2013    Procedure: ESOPHAGOGASTRODUODENOSCOPY (EGD) WITH PROPOFOL;  Surgeon: Lafayette Dragon, MD;  Location: [facility];  Service: Endoscopy;  Laterality: N/A;  . Botox injection N/A 09/18/2013    Procedure: BOTOX INJECTION;  Surgeon: Lafayette Dragon, MD;  Location: [facility];  Service: Endoscopy;  Laterality: N/A;   Social History:   reports that she has never smoked. She has never used smokeless tobacco. She reports that she does not drink alcohol or use illicit drugs.  Family History  Problem Relation Age of Onset  . Colon cancer Son 17  . Crohn's disease Son   . Cancer Maternal Aunt     Medications: Patient's Medications  New Prescriptions   No medications on file  Previous Medications   ALBUTEROL (PROVENTIL HFA;VENTOLIN HFA) 108 (90 BASE) MCG/ACT INHALER    Inhale 2 puffs into the lungs every 6 (six) hours as needed for wheezing or shortness of breath.   ATORVASTATIN (LIPITOR) 20 MG TABLET    Take 20 mg by mouth every  evening.    CHOLECALCIFEROL (VITAMIN D) 1000 UNITS TABLET    Take 1,000 Units by mouth every morning.   FEEDING SUPPLEMENT (RESOURCE BREEZE) LIQD    Take 90 mLs by mouth 4 (four) times daily.    FLUTICASONE-SALMETEROL (ADVAIR) 250-50 MCG/DOSE AEPB    Inhale 1 puff into the lungs every 12 (twelve) hours.   HYDROCODONE-ACETAMINOPHEN (NORCO) 10-325 MG PER TABLET    Take 1 tablet by mouth 4 (four) times daily.   HYDROXYUREA (HYDREA) 500 MG CAPSULE    Take 500-1,000 mg by mouth See admin instructions. Take 1000 mg  on Monday and 500 mg on Sunday, Wednesday and Friday   LORAZEPAM (ATIVAN) 0.5 MG TABLET    Take 0.5 mg by mouth every 4 (four) hours as needed for anxiety (and nausea).    MEMANTINE HCL ER (NAMENDA XR) 14 MG CP24    Take 14 mg by mouth daily.   METOPROLOL TARTRATE (LOPRESSOR) 25 MG TABLET    Take 25 mg by mouth 2 (two) times daily.   MIRTAZAPINE (REMERON) 7.5 MG TABLET    Take 7.5 mg by mouth at bedtime.   MONTELUKAST (SINGULAIR) 10 MG TABLET    Take 10 mg by mouth at bedtime.   MULTIPLE VITAMINS-MINERALS (CERTAVITE SENIOR/ANTIOXIDANT PO)    Take 1 tablet by mouth every morning.   NIFEDIPINE (PROCARDIA) 10 MG CAPSULE    Take 1 capsule (10 mg total) by mouth 3 (three) times daily before meals.   NITROGLYCERIN (NITROSTAT) 0.4 MG SL TABLET    Place 0.4 mg under the tongue every 5 (five) minutes as needed for chest pain.   OMEPRAZOLE (PRILOSEC) 20 MG CAPSULE    Take 20 mg by mouth every morning.    OXYCODONE (OXY IR/ROXICODONE) 5 MG IMMEDIATE RELEASE TABLET    Take 5 mg by mouth every 4 (four) hours as needed for severe pain.   PROMETHAZINE (PHENERGAN) 12.5 MG SUPPOSITORY    Place 12.5 mg rectally every 6 (six) hours as needed for nausea or vomiting.   PROMETHAZINE (PHENERGAN) 12.5 MG TABLET    Take 12.5 mg by mouth every 6 (six) hours as needed for nausea or vomiting.   RIVAROXABAN (XARELTO) 15 MG TABS TABLET    Take 15 mg by mouth daily.   SERTRALINE (ZOLOFT) 50 MG TABLET    Take 50 mg by mouth  every evening.   Modified Medications   No medications on file  Discontinued Medications   No medications on file     Physical Exam: Physical Exam  Constitutional: She is oriented to person, place, and time. She appears well-developed and well-nourished. No distress.  HENT:  Head: Normocephalic and atraumatic.  Eyes: Conjunctivae and EOM are normal. Pupils are equal, round, and reactive to light.  Neck: Normal range of motion. Neck supple. No JVD present. No thyromegaly present.  Cardiovascular: Normal rate, regular rhythm and normal heart sounds.   Pulmonary/Chest: Effort normal. She has decreased breath sounds. She has no wheezes. She has no rales.  Decreased breath sounds Bilat. Lungs.   Abdominal: Soft. Bowel sounds are normal. There is no tenderness.  Musculoskeletal: Normal range of motion. She exhibits tenderness (chronic lower back and the right hip since the fx. better controloled with scheduled Norco. ). She exhibits no edema.  Lower back.  Lymphadenopathy:    She has no cervical adenopathy.  Neurological: She is alert and oriented to person, place, and time. She has normal reflexes. No cranial nerve deficit. She exhibits normal muscle tone. Coordination normal.  Skin: Skin is warm and dry. No rash noted. She is not diaphoretic. No erythema (LLE).  The right hip surgical incision healed.   Psychiatric: Her mood appears anxious. Her affect is not angry, not blunt, not labile and not inappropriate. Her speech is not delayed, not tangential and not slurred. She is hyperactive and slowed. She is not agitated, not aggressive, not withdrawn and not combative. Thought content is not paranoid and not delusional. Cognition and memory are impaired. She does not express impulsivity or inappropriate judgment. She exhibits a depressed mood. She exhibits abnormal recent memory.  Filed Vitals:   10/16/13 1051  BP: 162/80  Pulse: 76      Labs reviewed: Basic Metabolic  Panel:  Recent Labs  11/22/12 1800 11/22/12 2249 11/23/12 0435  05/27/13 1446 07/26/13 1131 08/28/13  NA 141  --  143  < > 140 144 141  K 3.5  --  3.6  < > 4.5 4.0 4.2  CL 101  --  106  --   --  104  --   CO2 24  --  22  --  28 23  --   GLUCOSE 83  --  69*  --  121 93  --   BUN 17  --  14  < > 22.8 23 17   CREATININE 0.74  --  0.75  < > 1.1 0.84 0.8  CALCIUM 9.9  --  8.5  --  9.5 10.0  --   TSH  --  0.129*  --   --   --   --  1.32  < > = values in this interval not displayed. Liver Function Tests:  Recent Labs  11/22/12 1800 11/23/12 0435 05/15/13 05/27/13 1446 08/28/13  AST 23 22 16 19 18   ALT 12 11 10 11 12   ALKPHOS 81 73 47 63 56  BILITOT 0.5 0.5  --  0.33  --   PROT 7.1 6.1  --  6.5  --   ALBUMIN 3.7 3.0*  --  3.6  --     Recent Labs  11/22/12 1800  LIPASE 16   No results found for this basename: AMMONIA,  in the last 8760 hours CBC:  Recent Labs  11/23/12 0435  11/26/12 0526  05/27/13 1549  07/26/13 1131 08/21/13 09/18/13 10/16/13  WBC 8.4  < > 7.1  < > 5.7  < > 7.9 4.9 6.2 7.4  NEUTROABS 6.9  --   --   --  3.7  --  7.1  --   --   --   HGB 12.4  < > 11.2*  < > 12.6  < > 12.7 11.4* 11.5* 11.0*  HCT 39.1  < > 35.0*  < > 37.9  < > 39.4 35* 36 34*  MCV 110.1*  < > 106.7*  --  110.1*  --  112.6*  --   --   --   PLT 316  < > 291  < > 332  < > 306 361 437* 442*  < > = values in this interval not displayed. Lipid Panel: No results found for this basename: CHOL, HDL, LDLCALC, TRIG, CHOLHDL, LDLDIRECT,  in the last 8760 hours   Past Procedures:   NA  Assessment/Plan Anxiety and depression Stabilized. Sertraline to 50mg  daily 07/16/13. 08/26/13 Mirtazapine 7.5mg  qhs     BACK PAIN, LUMBAR Walking with walker as tolerated, continues with w/c prn. Continue Oxycodone 5mg  q4hr prn for better pain control. Continue Norco 10/325mg  qid. F/u [name].       Candida esophagitis Controlled. Diflucan 100mg  po q week    COPD with asthma Managed with  Fluticasone-Salmeterol 250/50 bid, dc'd Epipen prn and Immunotherapy per [facility] 01/17/13, Montelukast 10mg  daily, and Albuterol II puffs q6hr prn. Stable     Achalasia of esophagus Stable. Nifedipine 10mg  tid ac meals.  GERD Stable. Omeprazole 20mg  daily.       Essential hypertension On Metoprolol 25mg  bid. Last BP 162/80. Pt asymptomatic. Will monitor BP q d x 1 week and reassess.        Family/  Staff Communication: observe the patient.   Goals of Care: SNF  Labs/tests ordered: none  Note by Dondra Prader NP - student

## 2013-10-30 NOTE — Assessment & Plan Note (Signed)
Stable. Nifedipine 10mg tid ac meals.   

## 2013-10-30 NOTE — Assessment & Plan Note (Signed)
On Metoprolol 25mg  bid. Last BP 162/80. Pt asymptomatic. Will monitor BP q d x 1 week and reassess.

## 2013-11-10 ENCOUNTER — Encounter: Payer: Self-pay | Admitting: Nurse Practitioner

## 2013-11-10 DIAGNOSIS — M545 Low back pain, unspecified: Secondary | ICD-10-CM

## 2013-11-10 NOTE — Progress Notes (Signed)
This encounter was created in error - please disregard.

## 2013-11-18 LAB — CBC AND DIFFERENTIAL
HCT: 37 % (ref 36–46)
HEMOGLOBIN: 11 g/dL — AB (ref 12.0–16.0)
Platelets: 358 10*3/uL (ref 150–399)
WBC: 6.2 10^3/mL

## 2013-11-19 ENCOUNTER — Other Ambulatory Visit: Payer: Self-pay | Admitting: Nurse Practitioner

## 2013-11-19 DIAGNOSIS — D638 Anemia in other chronic diseases classified elsewhere: Secondary | ICD-10-CM

## 2013-11-19 DIAGNOSIS — D473 Essential (hemorrhagic) thrombocythemia: Secondary | ICD-10-CM

## 2013-11-24 ENCOUNTER — Encounter: Payer: Self-pay | Admitting: Oncology

## 2013-11-24 ENCOUNTER — Ambulatory Visit (INDEPENDENT_AMBULATORY_CARE_PROVIDER_SITE_OTHER): Payer: Medicare Other | Admitting: Oncology

## 2013-11-24 VITALS — BP 108/66 | HR 68 | Temp 98.2°F | Resp 20 | Ht <= 58 in | Wt 105.3 lb

## 2013-11-24 DIAGNOSIS — I82409 Acute embolism and thrombosis of unspecified deep veins of unspecified lower extremity: Secondary | ICD-10-CM

## 2013-11-24 DIAGNOSIS — D473 Essential (hemorrhagic) thrombocythemia: Secondary | ICD-10-CM

## 2013-11-24 NOTE — Progress Notes (Signed)
Patient ID: Sherri Crawford, female   DOB: 24-Sep-1920, 80 y.o.   MRN: [id number] Hematology and Oncology Follow Up Visit  Sherri Crawford [id number] 1920-09-17 80 y.o. 11/24/2013 5:03 PM   Principle Diagnosis: Encounter Diagnoses  Name Primary?  . Essential thrombocythemia Yes  . DVT (deep venous thrombosis), unspecified laterality      Interim History:  Sherri Crawford will be 73 years old next month! She is now living in [facility]. Medications are supervised. Son remains very supportive and accompanies her on all her M.D. visits including today's visit. She has had no interim medical problems. There was a discrepancy in the medication list from the nursing facility and she is taking less Hydrea than what I had recorded in my notes. However, her platelet count has been very stable and I will continue the dose she is receiving at the nursing facility which is 1 g on Mondays, 500 mg on Sundays Wednesdays and Fridays. She voices no neurologic complaints and specifically denies severe headache, diplopia, other acute changes in vision, dysarthria, or focal weakness. She is still on Xarelto 15 mg daily She is receiving Diflucan 100 mg weekly and Zoloft 50 mg weekly and these are 2 of the few drugs that do interact with Xarelto. She has had no bleeding problems. She denies any epistaxis, gum bleeding, hematochezia, or hematuria.  She had a brief hospitalization last September after she saw me for nausea and vomiting. She had a GI evaluation. She has a previous history of esophageal stricture. She underwent endoscopy on 11/25/2012. No gross stricture but a cautionary dilatation was done. Random biopsies without any malignant change.  Medications: reviewed  Allergies:  Allergies  Allergen Reactions  . Sulfonamide Derivatives Other (See Comments)    Pt is unsure of reaction    Review of Systems: Hematology:  No bleeding or bruising ENT ROS: No sore throat Breast ROS:  Respiratory ROS: No  cough or dyspnea Cardiovascular ROS:  No chest pain or palpitations Gastrointestinal ROS: No abdominal pain or change in bowel habit   Genito-Urinary ROS: Not questioned Musculoskeletal ROS: Intermittent arthritis discomfort Neurological ROS: See history of present illness Dermatological ROS: No rash Remaining ROS negative:   Physical Exam: Blood pressure 108/66, pulse 68, temperature 98.2 F (36.8 C), temperature source Oral, resp. rate 20, height 4\' 9"  (1.448 m), weight 105 lb 4.8 oz (47.764 kg), SpO2 96.00%. Wt Readings from Last 3 Encounters:  11/24/13 105 lb 4.8 oz (47.764 kg)  09/18/13 99 lb (44.906 kg)  09/18/13 99 lb (44.906 kg)     General appearance: thin caucasian woman, NAD HENNT: Pharynx no erythema, exudate, mass, or ulcer. No thyromegaly or thyroid nodules Lymph nodes: No cervical, supraclavicular, or axillary lymphadenopathy Breasts: Lungs: Clear to auscultation, resonant to percussion throughout Heart: Regular rhythm, no murmur, no gallop, no rub, no click, no edema Abdomen: Soft, nontender, normal bowel sounds, no mass, no organomegaly Extremities: No edema, no calf tenderness Musculoskeletal: no joint deformities GU:  Vascular: Carotid pulses 2+, no bruits, Neurologic: Alert, oriented, PERRLA, optic discs sharp on left; not visualized on right and vessels normal, no hemorrhage or exudate on left, cranial nerves grossly normal,except decreased hearing; motor strength 5 over 5, reflexes 1+ symmetric, upper body coordination normal, gait normal, Skin: No rash or ecchymosis  Lab Results: CBC W/Diff    Component Value Date/Time   WBC 6.2 11/18/2013   WBC 7.9 07/26/2013 1131   WBC 5.7 05/27/2013 1549   RBC 3.50* 07/26/2013 1131  RBC 3.44* 05/27/2013 1549   HGB 11.0* 11/18/2013   HGB 12.6 05/27/2013 1549   HCT 37 11/18/2013   HCT 37.9 05/27/2013 1549   PLT 358 11/18/2013   PLT 332 05/27/2013 1549   MCV 112.6* 07/26/2013 1131   MCV 110.1* 05/27/2013 1549   MCH 36.3*  07/26/2013 1131   MCH 36.7* 05/27/2013 1549   MCHC 32.2 07/26/2013 1131   MCHC 33.3 05/27/2013 1549   RDW 14.2 07/26/2013 1131   RDW 14.1 05/27/2013 1549   LYMPHSABS 0.6* 07/26/2013 1131   LYMPHSABS 1.6 05/27/2013 1549   MONOABS 0.2 07/26/2013 1131   MONOABS 0.3 05/27/2013 1549   EOSABS 0.0 07/26/2013 1131   EOSABS 0.1 05/27/2013 1549   BASOSABS 0.0 07/26/2013 1131   BASOSABS 0.0 05/27/2013 1549     Chemistry      Component Value Date/Time   NA 141 08/28/2013   NA 144 07/26/2013 1131   NA 140 05/27/2013 1446   K 4.2 08/28/2013   K 4.5 05/27/2013 1446   CL 104 07/26/2013 1131   CL 106 07/16/2012 1417   CO2 23 07/26/2013 1131   CO2 28 05/27/2013 1446   BUN 17 08/28/2013   BUN 23 07/26/2013 1131   BUN 22.8 05/27/2013 1446   CREATININE 0.8 08/28/2013   CREATININE 0.84 07/26/2013 1131   CREATININE 1.1 05/27/2013 1446   GLU 82 08/28/2013      Component Value Date/Time   CALCIUM 10.0 07/26/2013 1131   CALCIUM 9.5 05/27/2013 1446   ALKPHOS 56 08/28/2013   ALKPHOS 63 05/27/2013 1446   AST 18 08/28/2013   AST 19 05/27/2013 1446   ALT 12 08/28/2013   ALT 11 05/27/2013 1446   BILITOT 0.33 05/27/2013 1446   BILITOT 0.5 11/23/2012 0435         Impression:  #1. Myeloproliferative disorder/essential thrombocythemia Blood counts are stable on long-term low-dose hydroxyurea. Plan continue the same. Somebody has stopped her low-dose aspirin 81 mg daily. I think that this would be useful to resume since he gets protection against arterial events. Blood counts have been very stable so I feel comfortable changing to an every 2 month schedule.  #2. Status post left lower extremity DVT May 2012. Initially treated with warfarin. Now on Xarelto.  #3. Status post traumatic right femoral neck fracture requiring right hip hemiarthroplasty July 2014.  #4. History of esophageal stricture requiring periodic dilatation. Question pseudo-achalasia.  #5. Hypertension   #6. Coronary artery disease   #7. Hyperlipidemia   #8. Mild  senile dementia on Namenda    CC: Patient Care Team: Estill Dooms, MD as PCP - General (Internal Medicine) Estill Dooms, MD as PCP - Internal Medicine (Internal Medicine) Annia Belt, MD as Consulting Physician (Hematology and Oncology) Cindee Salt, MD as Consulting Physician (Physical Medicine and Rehabilitation) Noralee Space, MD (Pulmonary Disease) [facility] [place] [facility] Otho Darner, NP as Nurse Practitioner (Nurse Practitioner)   Annia Belt, MD 9/28/20155:03 PM

## 2013-11-24 NOTE — Patient Instructions (Signed)
Continue Hydroxyurea 500 mg    2 tablets on Mondays; one tablet on Sunday/Wednesday/ and Fridays Will will decrease lab work to CBC,differential every 2 months Return visit with Dr Beryle Beams in 6 months

## 2013-12-15 ENCOUNTER — Telehealth: Payer: Self-pay | Admitting: *Deleted

## 2013-12-15 NOTE — Telephone Encounter (Signed)
Patient son, Gene, called and stated that he needed a letter stating that it is Medical Necessary that patient is in the nursing home so they can be reimbursed. Patient is in [place] so I informed him to call the nurse there and she would get that taken care of for him. He agreed.

## 2013-12-24 ENCOUNTER — Non-Acute Institutional Stay (SKILLED_NURSING_FACILITY): Payer: Medicare Other | Admitting: Nurse Practitioner

## 2013-12-24 ENCOUNTER — Encounter: Payer: Self-pay | Admitting: Nurse Practitioner

## 2013-12-24 DIAGNOSIS — I82402 Acute embolism and thrombosis of unspecified deep veins of left lower extremity: Secondary | ICD-10-CM

## 2013-12-24 DIAGNOSIS — M5440 Lumbago with sciatica, unspecified side: Secondary | ICD-10-CM

## 2013-12-24 DIAGNOSIS — F32A Depression, unspecified: Secondary | ICD-10-CM

## 2013-12-24 DIAGNOSIS — F419 Anxiety disorder, unspecified: Secondary | ICD-10-CM

## 2013-12-24 DIAGNOSIS — I10 Essential (primary) hypertension: Secondary | ICD-10-CM

## 2013-12-24 DIAGNOSIS — K2100 Gastro-esophageal reflux disease with esophagitis, without bleeding: Secondary | ICD-10-CM

## 2013-12-24 DIAGNOSIS — F329 Major depressive disorder, single episode, unspecified: Secondary | ICD-10-CM

## 2013-12-24 DIAGNOSIS — F418 Other specified anxiety disorders: Secondary | ICD-10-CM

## 2013-12-24 DIAGNOSIS — J449 Chronic obstructive pulmonary disease, unspecified: Secondary | ICD-10-CM

## 2013-12-24 DIAGNOSIS — K220 Achalasia of cardia: Secondary | ICD-10-CM

## 2013-12-24 DIAGNOSIS — J4489 Other specified chronic obstructive pulmonary disease: Secondary | ICD-10-CM

## 2013-12-24 DIAGNOSIS — D473 Essential (hemorrhagic) thrombocythemia: Secondary | ICD-10-CM

## 2013-12-24 DIAGNOSIS — D638 Anemia in other chronic diseases classified elsewhere: Secondary | ICD-10-CM

## 2013-12-24 DIAGNOSIS — B3781 Candidal esophagitis: Secondary | ICD-10-CM

## 2013-12-24 NOTE — Assessment & Plan Note (Signed)
Switched to [place] since 09/12/12. Left   

## 2013-12-24 NOTE — Assessment & Plan Note (Signed)
Controlled. Diflucan 100mg  po q week

## 2013-12-24 NOTE — Assessment & Plan Note (Signed)
11/18/13 Hgb 11.0

## 2013-12-24 NOTE — Progress Notes (Signed)
Patient ID: Sherri Crawford, female   DOB: [birth year], 80 y.o.   MRN: [id number]   Code Status: DNR  Allergies  Allergen Reactions  . Sulfonamide Derivatives Other (See Comments)    Pt is unsure of reaction    Chief Complaint  Patient presents with  . Medical Management of Chronic Issues    HPI: Patient is a 80 y.o. female seen in the SNF at [facility] today for evaluation of chronic medical conditions.  Problem List Items Addressed This Visit    GERD    Stable. Omeprazole 20mg  daily.       Essential thrombocythemia    f/u Hematology, takes Hydrea 1000mg  Monday, 500mg  Wed, Fri, Sun. Plt 413 12/05/12 and 264 05/15/13.Chronic anticoagulation with Xarelto. Uric acid 3.6 05/15/13(monitor for hyperuricemia related to Hydrea- tumor lysis)   11/18/13 Plt 358    Essential hypertension, benign - Primary    On Metoprolol 25mg  bid. Controlled.        Deep vein thrombosis of left lower extremity    Switched to Xeralto since 09/12/12. Left      COPD with asthma    Managed with Fluticasone-Salmeterol 250/50 bid, dc'd Epipen prn and Immunotherapy per [facility] 01/17/13, Montelukast 10mg  daily, and Albuterol II puffs q6hr prn. Stable      Candida esophagitis    Controlled. Diflucan 100mg  po q week       BACK PAIN, LUMBAR    Walking with walker as tolerated, continues with w/c prn. Continue Norco 10/325mg  qid. F/u [name].  11/10/13 dc Oxycodone prn-not used in 60 days.      Anxiety and depression    Stabilized. Sertraline to 50mg  daily 07/16/13. 08/26/13 Mirtazapine 7.5mg  qhs        Anemia of chronic disease    11/18/13 Hgb 11.0     Achalasia of esophagus    Stable. Nifedipine 10mg  tid ac meals.        Review of Systems:  Review of Systems  Constitutional: Negative for fever, chills, weight loss, malaise/fatigue and diaphoresis.       # 95-96 in the past 2 months.   HENT: Positive for hearing loss. Negative for congestion, ear discharge, ear pain,  nosebleeds, sore throat and tinnitus.   Eyes: Negative for blurred vision, double vision, photophobia, pain, discharge and redness.  Respiratory: Negative for cough, hemoptysis, sputum production, shortness of breath, wheezing (occasional. ) and stridor.   Cardiovascular: Negative for chest pain, palpitations, orthopnea, claudication and PND. Leg swelling: trace RLE-resolved.   Gastrointestinal: Negative for heartburn, nausea, vomiting, abdominal pain, diarrhea, constipation, blood in stool and melena.       Dysphagia-no problem presently.   Genitourinary: Positive for frequency. Negative for dysuria, urgency, hematuria and flank pain.       X1/night average and no trouble returning to asleep  Musculoskeletal: Positive for falls (recent and resulted in the right hip fx. ) and joint pain (right hip). Negative for myalgias and neck pain. Back pain: chronic back pain is managed with Norco.       Ambulates with walker. Hx of lower back pain: had multiple inj and presently taking Norco 5xday. Worsened lower back pain since her fall 06/22/13-better now  Skin: Negative for itching and rash.          Neurological: Negative for dizziness, tingling, tremors, sensory change, speech change, focal weakness, seizures, loss of consciousness, weakness and headaches.  Endo/Heme/Allergies: Positive for environmental allergies. Negative for polydipsia. Bruises/bleeds easily.  Psychiatric/Behavioral: Positive for depression  and memory loss. Negative for suicidal ideas, hallucinations and substance abuse. The patient is nervous/anxious. The patient does not have insomnia.      Past Medical History  Diagnosis Date  . Macular degeneration   . COPD (chronic obstructive pulmonary disease)   . Hypertension   . Coronary artery disease   . Hypercholesterolemia   . GERD (gastroesophageal reflux disease)   . Asthmatic bronchitis   . History of colonic polyps   . DJD (degenerative joint disease)   . Lumbar back pain     . Osteoporosis   . History of transient ischemic attack (TIA)   . Anxiety   . Essential thrombocythemia   . Diverticulosis 05/10/2012  . Osteoarthrosis, unspecified whether generalized or localized, unspecified site 05/09/2012  . Memory loss 05/09/2012  . Abnormal weight gain 05/09/2012  . Phlebitis and thrombophlebitis of other deep vessels of lower extremities 07/10/2010  . Essential thrombocythemia 06/09/2009  . Depression   . Esophageal stricture    Past Surgical History  Procedure Laterality Date  . Appendectomy    . Cholecystectomy    . Tah and bso  08/21/1995  . Cataract extraction w/ intraocular lens  implant, bilateral    . Abdominal hysterectomy    . Hip arthroplasty Right 09/09/2012    Procedure: ARTHROPLASTY BIPOLAR HIP;  Surgeon: Mauri Pole, MD;  Location: [facility];  Service: Orthopedics;  Laterality: Right;  . Esophagogastroduodenoscopy (egd) with esophageal dilation N/A 11/25/2012    Procedure: ESOPHAGOGASTRODUODENOSCOPY (EGD) WITH ESOPHAGEAL DILATION;  Surgeon: Jerene Bears, MD;  Location: [facility];  Service: Gastroenterology;  Laterality: N/A;  . Esophagogastroduodenoscopy N/A 07/26/2013    Procedure: ESOPHAGOGASTRODUODENOSCOPY (EGD);  Surgeon: Irene Shipper, MD;  Location: [facility] END[facility]COPY;  Service: Endoscopy;  Laterality: N/A;  . Esophageal manometry N/A 08/25/2013    Procedure: ESOPHAGEAL MANOMETRY (EM);  Surgeon: Jerene Bears, MD;  Location: [facility];  Service: Gastroenterology;  Laterality: N/A;  . Esophagogastroduodenoscopy (egd) with propofol N/A 09/18/2013    Procedure: ESOPHAGOGASTRODUODENOSCOPY (EGD) WITH PROPOFOL;  Surgeon: Lafayette Dragon, MD;  Location: [facility];  Service: Endoscopy;  Laterality: N/A;  . Botox injection N/A 09/18/2013    Procedure: BOTOX INJECTION;  Surgeon: Lafayette Dragon, MD;  Location: [facility];  Service: Endoscopy;  Laterality: N/A;   Social History:   reports that she has never smoked. She has never used smokeless tobacco. She  reports that she does not drink alcohol or use illicit drugs.  Family History  Problem Relation Age of Onset  . Colon cancer Son 52  . Crohn's disease Son   . Cancer Maternal Aunt     Medications: Patient's Medications  New Prescriptions   No medications on file  Previous Medications   ALBUTEROL (PROVENTIL HFA;VENTOLIN HFA) 108 (90 BASE) MCG/ACT INHALER    Inhale 2 puffs into the lungs every 6 (six) hours as needed for wheezing or shortness of breath.   ATORVASTATIN (LIPITOR) 20 MG TABLET    Take 20 mg by mouth every evening.    CHOLECALCIFEROL (VITAMIN D) 1000 UNITS TABLET    Take 1,000 Units by mouth every morning.   FEEDING SUPPLEMENT (RESOURCE BREEZE) LIQD    Take 90 mLs by mouth 4 (four) times daily.    FLUTICASONE-SALMETEROL (ADVAIR) 250-50 MCG/DOSE AEPB    Inhale 1 puff into the lungs every 12 (twelve) hours.   HYDROCODONE-ACETAMINOPHEN (NORCO) 10-325 MG PER TABLET    Take 1 tablet by mouth 4 (four) times daily.   HYDROXYUREA (HYDREA) 500  MG CAPSULE    Take 500-1,000 mg by mouth See admin instructions. Take 1000 mg on Monday and 500 mg on Sunday, Wednesday and Friday   LORAZEPAM (ATIVAN) 0.5 MG TABLET    Take 0.5 mg by mouth every 4 (four) hours as needed for anxiety (and nausea).    MEMANTINE HCL ER (NAMENDA XR) 14 MG CP24    Take 14 mg by mouth daily.   METOPROLOL TARTRATE (LOPRESSOR) 25 MG TABLET    Take 25 mg by mouth 2 (two) times daily.   MIRTAZAPINE (REMERON) 7.5 MG TABLET    Take 7.5 mg by mouth at bedtime.   MONTELUKAST (SINGULAIR) 10 MG TABLET    Take 10 mg by mouth at bedtime.   MULTIPLE VITAMINS-MINERALS (CERTAVITE SENIOR/ANTIOXIDANT PO)    Take 1 tablet by mouth every morning.   NIFEDIPINE (PROCARDIA) 10 MG CAPSULE    Take 1 capsule (10 mg total) by mouth 3 (three) times daily before meals.   NITROGLYCERIN (NITROSTAT) 0.4 MG SL TABLET    Place 0.4 mg under the tongue every 5 (five) minutes as needed for chest pain.   OMEPRAZOLE (PRILOSEC) 20 MG CAPSULE    Take 20 mg  by mouth every morning.    OXYCODONE (OXY IR/ROXICODONE) 5 MG IMMEDIATE RELEASE TABLET    Take 5 mg by mouth every 4 (four) hours as needed for severe pain.   PROMETHAZINE (PHENERGAN) 12.5 MG SUPPOSITORY    Place 12.5 mg rectally every 6 (six) hours as needed for nausea or vomiting.   PROMETHAZINE (PHENERGAN) 12.5 MG TABLET    Take 12.5 mg by mouth every 6 (six) hours as needed for nausea or vomiting.   RIVAROXABAN (XARELTO) 15 MG TABS TABLET    Take 15 mg by mouth daily.   SERTRALINE (ZOLOFT) 50 MG TABLET    Take 50 mg by mouth every evening.   Modified Medications   No medications on file  Discontinued Medications   No medications on file     Physical Exam: Physical Exam  Constitutional: She is oriented to person, place, and time. She appears well-developed and well-nourished. No distress.  HENT:  Head: Normocephalic and atraumatic.  Eyes: Conjunctivae and EOM are normal. Pupils are equal, round, and reactive to light.  Neck: Normal range of motion. Neck supple. No JVD present. No thyromegaly present.  Cardiovascular: Normal rate, regular rhythm and normal heart sounds.   Pulmonary/Chest: Effort normal. She has decreased breath sounds. She has no wheezes. She has no rales.  Decreased breath sounds Bilat. Lungs.   Abdominal: Soft. Bowel sounds are normal. There is no tenderness.  Musculoskeletal: Normal range of motion. She exhibits tenderness (chronic lower back and the right hip since the fx. better controloled with scheduled Norco. ). She exhibits no edema.  Lower back.  Lymphadenopathy:    She has no cervical adenopathy.  Neurological: She is alert and oriented to person, place, and time. She has normal reflexes. No cranial nerve deficit. She exhibits normal muscle tone. Coordination normal.  Skin: Skin is warm and dry. No rash noted. She is not diaphoretic. No erythema (LLE).  The right hip surgical incision healed.   Psychiatric: Her mood appears anxious. Her affect is not  angry, not blunt, not labile and not inappropriate. Her speech is not delayed, not tangential and not slurred. She is hyperactive and slowed. She is not agitated, not aggressive, not withdrawn and not combative. Thought content is not paranoid and not delusional. Cognition and memory are impaired. She  does not express impulsivity or inappropriate judgment. She exhibits a depressed mood. She exhibits abnormal recent memory.    Filed Vitals:   12/24/13 1445  BP: 132/70  Pulse: 66  Temp: 97.8 F (36.6 C)  TempSrc: Tympanic  Resp: 20      Labs reviewed: Basic Metabolic Panel:  Recent Labs  05/27/13 1446 07/26/13 1131 08/28/13  NA 140 144 141  K 4.5 4.0 4.2  CL  --  104  --   CO2 28 23  --   GLUCOSE 121 93  --   BUN 22.8 23 17   CREATININE 1.1 0.84 0.8  CALCIUM 9.5 10.0  --   TSH  --   --  1.32   Liver Function Tests:  Recent Labs  05/15/13 05/27/13 1446 08/28/13  AST 16 19 18   ALT 10 11 12   ALKPHOS 47 63 56  BILITOT  --  0.33  --   PROT  --  6.5  --   ALBUMIN  --  3.6  --    CBC:  Recent Labs  05/27/13 1549  07/26/13 1131  09/18/13 10/16/13 11/18/13  WBC 5.7  < > 7.9  < > 6.2 7.4 6.2  NEUTROABS 3.7  --  7.1  --   --   --   --   HGB 12.6  < > 12.7  < > 11.5* 11.0* 11.0*  HCT 37.9  < > 39.4  < > 36 34* 37  MCV 110.1*  --  112.6*  --   --   --   --   PLT 332  < > 306  < > 437* 442* 358  < > = values in this interval not displayed.  Past Procedures:  09/09/12  CT head:  IMPRESSION: No skull fracture or intracranial hemorrhage.  Small vessel disease type changes without CT evidence of large acute infarct.  X-ray right hip  IMPRESSION: Right femoral prosthesis without acute complication  X-ray Lumbar spine  IMPRESSION: Diffuse lumbar spondylosis.  No evidence of fracture.   X--ray R hip  IMPRESSION: Displaced right subcapital hip fracture.  11/25/12 GED: Esophageal dysmotility w/o definitive stricture. S/p empiric dilation in the lower third of  the esophagus and across GE junction with TTS balloon to 60mm. Recommended diet as tolerated.   06/25/13 X-ray lumbar spine: disc disease, slight rotoscoliosis curvature to the left, the is a right hip prosthesis present, there is an eggshell calcification in the right upper quadrant of uncertain etiology.    Assessment/Plan Achalasia of esophagus Stable. Nifedipine 10mg  tid ac meals.   Anemia of chronic disease 11/18/13 Hgb 11.0   Anxiety and depression Stabilized. Sertraline to 50mg  daily 07/16/13. 08/26/13 Mirtazapine 7.5mg  qhs      BACK PAIN, LUMBAR Walking with walker as tolerated, continues with w/c prn. Continue Norco 10/325mg  qid. F/u [name].  11/10/13 dc Oxycodone prn-not used in 60 days.    Candida esophagitis Controlled. Diflucan 100mg  po q week     COPD with asthma Managed with Fluticasone-Salmeterol 250/50 bid, dc'd Epipen prn and Immunotherapy per [facility] 01/17/13, Montelukast 10mg  daily, and Albuterol II puffs q6hr prn. Stable    Deep vein thrombosis of left lower extremity Switched to Xeralto since 09/12/12. Left    Essential hypertension, benign On Metoprolol 25mg  bid. Controlled.      Essential thrombocythemia f/u Hematology, takes Hydrea 1000mg  Monday, 500mg  Wed, Fri, Sun. Plt 413 12/05/12 and 264 05/15/13.Chronic anticoagulation with Xarelto. Uric acid 3.6 05/15/13(monitor for hyperuricemia related to Hydrea- tumor  lysis)   11/18/13 Plt 358  GERD Stable. Omeprazole 20mg  daily.       Family/ Staff Communication: observe the patient  Goals of Care: SNF  Labs/tests ordered: none    Past Medical History  Diagnosis Date  . Macular degeneration   . COPD (chronic obstructive pulmonary disease)   . Hypertension   . Coronary artery disease   . Hypercholesterolemia   . GERD (gastroesophageal reflux disease)   . Asthmatic bronchitis   . History of colonic polyps   . DJD (degenerative joint disease)   . Lumbar back pain   .  Osteoporosis   . History of transient ischemic attack (TIA)   . Anxiety   . Essential thrombocythemia   . Diverticulosis 05/10/2012  . Osteoarthrosis, unspecified whether generalized or localized, unspecified site 05/09/2012  . Memory loss 05/09/2012  . Abnormal weight gain 05/09/2012  . Phlebitis and thrombophlebitis of other deep vessels of lower extremities 07/10/2010  . Essential thrombocythemia 06/09/2009  . Depression   . Esophageal stricture    Past Surgical History  Procedure Laterality Date  . Appendectomy    . Cholecystectomy    . Tah and bso  08/21/1995  . Cataract extraction w/ intraocular lens  implant, bilateral    . Abdominal hysterectomy    . Hip arthroplasty Right 09/09/2012    Procedure: ARTHROPLASTY BIPOLAR HIP;  Surgeon: Mauri Pole, MD;  Location: [facility];  Service: Orthopedics;  Laterality: Right;  . Esophagogastroduodenoscopy (egd) with esophageal dilation N/A 11/25/2012    Procedure: ESOPHAGOGASTRODUODENOSCOPY (EGD) WITH ESOPHAGEAL DILATION;  Surgeon: Jerene Bears, MD;  Location: [facility];  Service: Gastroenterology;  Laterality: N/A;  . Esophagogastroduodenoscopy N/A 07/26/2013    Procedure: ESOPHAGOGASTRODUODENOSCOPY (EGD);  Surgeon: Irene Shipper, MD;  Location: [facility] ENDOSCOPY;  Service: Endoscopy;  Laterality: N/A;  . Esophageal manometry N/A 08/25/2013    Procedure: ESOPHAGEAL MANOMETRY (EM);  Surgeon: Jerene Bears, MD;  Location: [facility];  Service: Gastroenterology;  Laterality: N/A;  . Esophagogastroduodenoscopy (egd) with propofol N/A 09/18/2013    Procedure: ESOPHAGOGASTRODUODENOSCOPY (EGD) WITH PROPOFOL;  Surgeon: Lafayette Dragon, MD;  Location: [facility];  Service: Endoscopy;  Laterality: N/A;  . Botox injection N/A 09/18/2013    Procedure: BOTOX INJECTION;  Surgeon: Lafayette Dragon, MD;  Location: [facility];  Service: Endoscopy;  Laterality: N/A;   Social History:   reports that she has never smoked. She has never used smokeless tobacco. She reports  that she does not drink alcohol or use illicit drugs.  Family History  Problem Relation Age of Onset  . Colon cancer Son 73  . Crohn's disease Son   . Cancer Maternal Aunt     Medications: Patient's Medications  New Prescriptions   No medications on file  Previous Medications   ALBUTEROL (PROVENTIL HFA;VENTOLIN HFA) 108 (90 BASE) MCG/ACT INHALER    Inhale 2 puffs into the lungs every 6 (six) hours as needed for wheezing or shortness of breath.   ATORVASTATIN (LIPITOR) 20 MG TABLET    Take 20 mg by mouth every evening.    CHOLECALCIFEROL (VITAMIN D) 1000 UNITS TABLET    Take 1,000 Units by mouth every morning.   FEEDING SUPPLEMENT (RESOURCE BREEZE) LIQD    Take 90 mLs by mouth 4 (four) times daily.    FLUTICASONE-SALMETEROL (ADVAIR) 250-50 MCG/DOSE AEPB    Inhale 1 puff into the lungs every 12 (twelve) hours.   HYDROCODONE-ACETAMINOPHEN (NORCO) 10-325 MG PER TABLET    Take 1 tablet by mouth  4 (four) times daily.   HYDROXYUREA (HYDREA) 500 MG CAPSULE    Take 500-1,000 mg by mouth See admin instructions. Take 1000 mg on Monday and 500 mg on Sunday, Wednesday and Friday   LORAZEPAM (ATIVAN) 0.5 MG TABLET    Take 0.5 mg by mouth every 4 (four) hours as needed for anxiety (and nausea).    MEMANTINE HCL ER (NAMENDA XR) 14 MG CP24    Take 14 mg by mouth daily.   METOPROLOL TARTRATE (LOPRESSOR) 25 MG TABLET    Take 25 mg by mouth 2 (two) times daily.   MIRTAZAPINE (REMERON) 7.5 MG TABLET    Take 7.5 mg by mouth at bedtime.   MONTELUKAST (SINGULAIR) 10 MG TABLET    Take 10 mg by mouth at bedtime.   MULTIPLE VITAMINS-MINERALS (CERTAVITE SENIOR/ANTIOXIDANT PO)    Take 1 tablet by mouth every morning.   NIFEDIPINE (PROCARDIA) 10 MG CAPSULE    Take 1 capsule (10 mg total) by mouth 3 (three) times daily before meals.   NITROGLYCERIN (NITROSTAT) 0.4 MG SL TABLET    Place 0.4 mg under the tongue every 5 (five) minutes as needed for chest pain.   OMEPRAZOLE (PRILOSEC) 20 MG CAPSULE    Take 20 mg by mouth  every morning.    OXYCODONE (OXY IR/ROXICODONE) 5 MG IMMEDIATE RELEASE TABLET    Take 5 mg by mouth every 4 (four) hours as needed for severe pain.   PROMETHAZINE (PHENERGAN) 12.5 MG SUPPOSITORY    Place 12.5 mg rectally every 6 (six) hours as needed for nausea or vomiting.   PROMETHAZINE (PHENERGAN) 12.5 MG TABLET    Take 12.5 mg by mouth every 6 (six) hours as needed for nausea or vomiting.   RIVAROXABAN (XARELTO) 15 MG TABS TABLET    Take 15 mg by mouth daily.   SERTRALINE (ZOLOFT) 50 MG TABLET    Take 50 mg by mouth every evening.   Modified Medications   No medications on file  Discontinued Medications   No medications on file     Physical Exam: Physical Exam  Constitutional: She is oriented to person, place, and time. She appears well-developed and well-nourished. No distress.  HENT:  Head: Normocephalic and atraumatic.  Eyes: Conjunctivae and EOM are normal. Pupils are equal, round, and reactive to light.  Neck: Normal range of motion. Neck supple. No JVD present. No thyromegaly present.  Cardiovascular: Normal rate, regular rhythm and normal heart sounds.   Pulmonary/Chest: Effort normal. She has decreased breath sounds. She has no wheezes. She has no rales.  Decreased breath sounds Bilat. Lungs.   Abdominal: Soft. Bowel sounds are normal. There is no tenderness.  Musculoskeletal: Normal range of motion. She exhibits tenderness (chronic lower back and the right hip since the fx. better controloled with scheduled Norco. ). She exhibits no edema.  Lower back-baseline: pain at end of day occasionally.   Lymphadenopathy:    She has no cervical adenopathy.  Neurological: She is alert and oriented to person, place, and time. She has normal reflexes. No cranial nerve deficit. She exhibits normal muscle tone. Coordination normal.  Skin: Skin is warm and dry. No rash noted. She is not diaphoretic. No erythema (LLE).  The right hip surgical incision healed.   Psychiatric: Her mood  appears anxious. Her affect is not angry, not blunt, not labile and not inappropriate. Her speech is not delayed, not tangential and not slurred. She is hyperactive and slowed. She is not agitated, not aggressive, not withdrawn and  not combative. Thought content is not paranoid and not delusional. Cognition and memory are impaired. She does not express impulsivity or inappropriate judgment. She exhibits a depressed mood. She exhibits abnormal recent memory.     Filed Vitals:   12/24/13 1445  BP: 132/70  Pulse: 66  Temp: 97.8 F (36.6 C)  TempSrc: Tympanic  Resp: 20      Labs reviewed: Basic Metabolic Panel:  Recent Labs  05/27/13 1446 07/26/13 1131 08/28/13  NA 140 144 141  K 4.5 4.0 4.2  CL  --  104  --   CO2 28 23  --   GLUCOSE 121 93  --   BUN 22.8 23 17   CREATININE 1.1 0.84 0.8  CALCIUM 9.5 10.0  --   TSH  --   --  1.32   Liver Function Tests:  Recent Labs  05/15/13 05/27/13 1446 08/28/13  AST 16 19 18   ALT 10 11 12   ALKPHOS 47 63 56  BILITOT  --  0.33  --   PROT  --  6.5  --   ALBUMIN  --  3.6  --    No results for input(s): LIPASE, AMYLASE in the last 8760 hours. No results for input(s): AMMONIA in the last 8760 hours. CBC:  Recent Labs  05/27/13 1549  07/26/13 1131  09/18/13 10/16/13 11/18/13  WBC 5.7  < > 7.9  < > 6.2 7.4 6.2  NEUTROABS 3.7  --  7.1  --   --   --   --   HGB 12.6  < > 12.7  < > 11.5* 11.0* 11.0*  HCT 37.9  < > 39.4  < > 36 34* 37  MCV 110.1*  --  112.6*  --   --   --   --   PLT 332  < > 306  < > 437* 442* 358  < > = values in this interval not displayed. Lipid Panel: No results for input(s): CHOL, HDL, LDLCALC, TRIG, CHOLHDL, LDLDIRECT in the last 8760 hours.   Past Procedures:   NA  Assessment/Plan Achalasia of esophagus Stable. Nifedipine 10mg  tid ac meals.   Anemia of chronic disease 11/18/13 Hgb 11.0   Anxiety and depression Stabilized. Sertraline to 50mg  daily 07/16/13. 08/26/13 Mirtazapine 7.5mg  qhs       BACK PAIN, LUMBAR Walking with walker as tolerated, continues with w/c prn. Continue Norco 10/325mg  qid. F/u [name].  11/10/13 dc Oxycodone prn-not used in 60 days.    Candida esophagitis Controlled. Diflucan 100mg  po q week     COPD with asthma Managed with Fluticasone-Salmeterol 250/50 bid, dc'd Epipen prn and Immunotherapy per [facility] 01/17/13, Montelukast 10mg  daily, and Albuterol II puffs q6hr prn. Stable    Deep vein thrombosis of left lower extremity Switched to Xeralto since 09/12/12. Left    Essential hypertension, benign On Metoprolol 25mg  bid. Controlled.      Essential thrombocythemia f/u Hematology, takes Hydrea 1000mg  Monday, 500mg  Wed, Fri, Sun. Plt 413 12/05/12 and 264 05/15/13.Chronic anticoagulation with Xarelto. Uric acid 3.6 05/15/13(monitor for hyperuricemia related to Hydrea- tumor lysis)   11/18/13 Plt 358  GERD Stable. Omeprazole 20mg  daily.       Family/ Staff Communication: observe the patient.   Goals of Care: SNF  Labs/tests ordered: none

## 2013-12-24 NOTE — Assessment & Plan Note (Signed)
f/u Hematology, takes Hydrea 1000mg  Monday, 500mg  Wed, Fri, Sun. Plt 413 12/05/12 and 264 05/15/13.Chronic anticoagulation with Xarelto. Uric acid 3.6 05/15/13(monitor for hyperuricemia related to Hydrea- tumor lysis)   11/18/13 Plt 358

## 2013-12-24 NOTE — Assessment & Plan Note (Signed)
Stabilized. Sertraline to 50mg daily 07/16/13. 08/26/13 Mirtazapine 7.5mg qhs   

## 2013-12-24 NOTE — Assessment & Plan Note (Signed)
Stable. Nifedipine 10mg tid ac meals.   

## 2013-12-24 NOTE — Assessment & Plan Note (Signed)
Walking with walker as tolerated, continues with w/c prn. Continue Norco 10/325mg  qid. F/u [name].  11/10/13 dc Oxycodone prn-not used in 60 days.

## 2013-12-24 NOTE — Assessment & Plan Note (Signed)
On Metoprolol 25mg  bid. Controlled.

## 2013-12-24 NOTE — Assessment & Plan Note (Signed)
Stable. Omeprazole 20mg  daily.

## 2013-12-24 NOTE — Assessment & Plan Note (Signed)
Managed with Fluticasone-Salmeterol 250/50 bid, dc'd Epipen prn and Immunotherapy per [facility] 01/17/13, Montelukast 10mg  daily, and Albuterol II puffs q6hr prn. Stable

## 2014-01-15 LAB — CBC AND DIFFERENTIAL
HCT: 36 % (ref 36–46)
HEMOGLOBIN: 11.8 g/dL — AB (ref 12.0–16.0)
Platelets: 276 10*3/uL (ref 150–399)
WBC: 6.7 10*3/mL

## 2014-01-19 ENCOUNTER — Non-Acute Institutional Stay (SKILLED_NURSING_FACILITY): Payer: Medicare Other | Admitting: Nurse Practitioner

## 2014-01-19 ENCOUNTER — Encounter: Payer: Self-pay | Admitting: Nurse Practitioner

## 2014-01-19 DIAGNOSIS — K220 Achalasia of cardia: Secondary | ICD-10-CM

## 2014-01-19 DIAGNOSIS — F419 Anxiety disorder, unspecified: Secondary | ICD-10-CM

## 2014-01-19 DIAGNOSIS — F32A Depression, unspecified: Secondary | ICD-10-CM

## 2014-01-19 DIAGNOSIS — D638 Anemia in other chronic diseases classified elsewhere: Secondary | ICD-10-CM

## 2014-01-19 DIAGNOSIS — K219 Gastro-esophageal reflux disease without esophagitis: Secondary | ICD-10-CM

## 2014-01-19 DIAGNOSIS — M5440 Lumbago with sciatica, unspecified side: Secondary | ICD-10-CM

## 2014-01-19 DIAGNOSIS — D473 Essential (hemorrhagic) thrombocythemia: Secondary | ICD-10-CM

## 2014-01-19 DIAGNOSIS — F329 Major depressive disorder, single episode, unspecified: Secondary | ICD-10-CM

## 2014-01-19 DIAGNOSIS — I82402 Acute embolism and thrombosis of unspecified deep veins of left lower extremity: Secondary | ICD-10-CM

## 2014-01-19 DIAGNOSIS — I10 Essential (primary) hypertension: Secondary | ICD-10-CM

## 2014-01-19 DIAGNOSIS — J449 Chronic obstructive pulmonary disease, unspecified: Secondary | ICD-10-CM

## 2014-01-19 DIAGNOSIS — F418 Other specified anxiety disorders: Secondary | ICD-10-CM

## 2014-01-19 DIAGNOSIS — B3781 Candidal esophagitis: Secondary | ICD-10-CM

## 2014-01-19 DIAGNOSIS — J4489 Other specified chronic obstructive pulmonary disease: Secondary | ICD-10-CM

## 2014-01-19 NOTE — Assessment & Plan Note (Signed)
On Metoprolol 25mg  bid. Controlled.

## 2014-01-19 NOTE — Assessment & Plan Note (Signed)
Stable. Omeprazole 20mg  daily.

## 2014-01-19 NOTE — Assessment & Plan Note (Signed)
Walking with walker as tolerated, continues with w/c prn. Continue Norco 10/325mg  qid. F/u [name].  11/10/13 dc Oxycodone prn-not used in 60 days.

## 2014-01-19 NOTE — Progress Notes (Signed)
Patient ID: Sherri Crawford, female   DOB: April 18, 1920, 80 y.o.   MRN: [id number]   Code Status: DNR  Allergies  Allergen Reactions  . Sulfonamide Derivatives Other (See Comments)    Pt is unsure of reaction    Chief Complaint  Patient presents with  . Medical Management of Chronic Issues    HPI: Patient is a 80 y.o. female seen in the SNF at [facility] today for evaluation of chronic medical conditions.  Problem List Items Addressed This Visit    GERD    Stable. Omeprazole 20mg  daily.       Essential thrombocythemia    11/18/13 Plt 358 01/15/14 Plt 276     Essential hypertension, benign - Primary    On Metoprolol 25mg  bid. Controlled.        Deep vein thrombosis of left lower extremity    Switched to Xeralto since 09/12/12. Left       COPD with asthma    Managed with Fluticasone-Salmeterol 250/50 bid, dc'd Epipen prn and Immunotherapy per [facility] 01/17/13, Montelukast 10mg  daily, and Albuterol II puffs q6hr prn. Stable       Candida esophagitis    Fully treated. Clinical stable.     BACK PAIN, LUMBAR    Walking with walker as tolerated, continues with w/c prn. Continue Norco 10/325mg  qid. F/u [name].  11/10/13 dc Oxycodone prn-not used in 60 days.       Anxiety and depression    Stabilized. Sertraline to 50mg  daily 07/16/13. 08/26/13 Mirtazapine 7.5mg  qhs        Anemia of chronic disease    11/18/13 Hgb 11.0 01/15/14 Hgb 11.8     Achalasia of esophagus    Stable. Nifedipine 10mg  tid ac meals.         Review of Systems:  Review of Systems  Constitutional: Negative for fever, chills, weight loss, malaise/fatigue and diaphoresis.       # 95-96 in the past 2 months.   HENT: Positive for hearing loss. Negative for congestion, ear discharge, ear pain, nosebleeds, sore throat and tinnitus.   Eyes: Negative for blurred vision, double vision, photophobia, pain, discharge and redness.  Respiratory: Negative for cough, hemoptysis, sputum  production, shortness of breath, wheezing (occasional. ) and stridor.   Cardiovascular: Negative for chest pain, palpitations, orthopnea, claudication and PND. Leg swelling: trace RLE-resolved.   Gastrointestinal: Negative for heartburn, nausea, vomiting, abdominal pain, diarrhea, constipation, blood in stool and melena.       Dysphagia-no problem presently.   Genitourinary: Positive for frequency. Negative for dysuria, urgency, hematuria and flank pain.       X1/night average and no trouble returning to asleep  Musculoskeletal: Positive for joint pain (right hip) and falls (recent and resulted in the right hip fx. ). Negative for myalgias and neck pain. Back pain: chronic back pain is managed with Norco.       Ambulates with walker. Hx of lower back pain: had multiple inj and presently taking Norco 5xday. Worsened lower back pain since her fall 06/22/13-better now  Skin: Negative for itching and rash.          Neurological: Negative for dizziness, tingling, tremors, sensory change, speech change, focal weakness, seizures, loss of consciousness, weakness and headaches.  Endo/Heme/Allergies: Positive for environmental allergies. Negative for polydipsia. Bruises/bleeds easily.  Psychiatric/Behavioral: Positive for depression and memory loss. Negative for suicidal ideas, hallucinations and substance abuse. The patient is nervous/anxious. The patient does not have insomnia.  Past Medical History  Diagnosis Date  . Macular degeneration   . COPD (chronic obstructive pulmonary disease)   . Hypertension   . Coronary artery disease   . Hypercholesterolemia   . GERD (gastroesophageal reflux disease)   . Asthmatic bronchitis   . History of colonic polyps   . DJD (degenerative joint disease)   . Lumbar back pain   . Osteoporosis   . History of transient ischemic attack (TIA)   . Anxiety   . Essential thrombocythemia   . Diverticulosis 05/10/2012  . Osteoarthrosis, unspecified whether  generalized or localized, unspecified site 05/09/2012  . Memory loss 05/09/2012  . Abnormal weight gain 05/09/2012  . Phlebitis and thrombophlebitis of other deep vessels of lower extremities 07/10/2010  . Essential thrombocythemia 06/09/2009  . Depression   . Esophageal stricture    Past Surgical History  Procedure Laterality Date  . Appendectomy    . Cholecystectomy    . Tah and bso  08/21/1995  . Cataract extraction w/ intraocular lens  implant, bilateral    . Abdominal hysterectomy    . Hip arthroplasty Right 09/09/2012    Procedure: ARTHROPLASTY BIPOLAR HIP;  Surgeon: Mauri Pole, MD;  Location: [facility];  Service: Orthopedics;  Laterality: Right;  . Esophagogastroduodenoscopy (egd) with esophageal dilation N/A 11/25/2012    Procedure: ESOPHAGOGASTRODUODENOSCOPY (EGD) WITH ESOPHAGEAL DILATION;  Surgeon: Jerene Bears, MD;  Location: [facility];  Service: Gastroenterology;  Laterality: N/A;  . Esophagogastroduodenoscopy N/A 07/26/2013    Procedure: ESOPHAGOGASTRODUODENOSCOPY (EGD);  Surgeon: Irene Shipper, MD;  Location: [facility] ENDOSCOPY;  Service: Endoscopy;  Laterality: N/A;  . Esophageal manometry N/A 08/25/2013    Procedure: ESOPHAGEAL MANOMETRY (EM);  Surgeon: Jerene Bears, MD;  Location: [facility];  Service: Gastroenterology;  Laterality: N/A;  . Esophagogastroduodenoscopy (egd) with propofol N/A 09/18/2013    Procedure: ESOPHAGOGASTRODUODENOSCOPY (EGD) WITH PROPOFOL;  Surgeon: Lafayette Dragon, MD;  Location: [facility];  Service: Endoscopy;  Laterality: N/A;  . Botox injection N/A 09/18/2013    Procedure: BOTOX INJECTION;  Surgeon: Lafayette Dragon, MD;  Location: [facility];  Service: Endoscopy;  Laterality: N/A;   Social History:   reports that she has never smoked. She has never used smokeless tobacco. She reports that she does not drink alcohol or use illicit drugs.  Family History  Problem Relation Age of Onset  . Colon cancer Son 51  . Crohn's disease Son   . Cancer Maternal  Aunt     Medications: Patient's Medications  New Prescriptions   No medications on file  Previous Medications   ALBUTEROL (PROVENTIL HFA;VENTOLIN HFA) 108 (90 BASE) MCG/ACT INHALER    Inhale 2 puffs into the lungs every 6 (six) hours as needed for wheezing or shortness of breath.   ATORVASTATIN (LIPITOR) 20 MG TABLET    Take 20 mg by mouth every evening.    CHOLECALCIFEROL (VITAMIN D) 1000 UNITS TABLET    Take 1,000 Units by mouth every morning.   FEEDING SUPPLEMENT (RESOURCE BREEZE) LIQD    Take 90 mLs by mouth 4 (four) times daily.    FLUTICASONE-SALMETEROL (ADVAIR) 250-50 MCG/DOSE AEPB    Inhale 1 puff into the lungs every 12 (twelve) hours.   HYDROCODONE-ACETAMINOPHEN (NORCO) 10-325 MG PER TABLET    Take 1 tablet by mouth 4 (four) times daily.   HYDROXYUREA (HYDREA) 500 MG CAPSULE    Take 500-1,000 mg by mouth See admin instructions. Take 1000 mg on Monday and 500 mg on Sunday, Wednesday and Friday  LORAZEPAM (ATIVAN) 0.5 MG TABLET    Take 0.5 mg by mouth every 4 (four) hours as needed for anxiety (and nausea).    MEMANTINE HCL ER (NAMENDA XR) 14 MG CP24    Take 14 mg by mouth daily.   METOPROLOL TARTRATE (LOPRESSOR) 25 MG TABLET    Take 25 mg by mouth 2 (two) times daily.   MIRTAZAPINE (REMERON) 7.5 MG TABLET    Take 7.5 mg by mouth at bedtime.   MONTELUKAST (SINGULAIR) 10 MG TABLET    Take 10 mg by mouth at bedtime.   MULTIPLE VITAMINS-MINERALS (CERTAVITE SENIOR/ANTIOXIDANT PO)    Take 1 tablet by mouth every morning.   NIFEDIPINE (PROCARDIA) 10 MG CAPSULE    Take 1 capsule (10 mg total) by mouth 3 (three) times daily before meals.   NITROGLYCERIN (NITROSTAT) 0.4 MG SL TABLET    Place 0.4 mg under the tongue every 5 (five) minutes as needed for chest pain.   OMEPRAZOLE (PRILOSEC) 20 MG CAPSULE    Take 20 mg by mouth every morning.    OXYCODONE (OXY IR/ROXICODONE) 5 MG IMMEDIATE RELEASE TABLET    Take 5 mg by mouth every 4 (four) hours as needed for severe pain.   PROMETHAZINE  (PHENERGAN) 12.5 MG SUPPOSITORY    Place 12.5 mg rectally every 6 (six) hours as needed for nausea or vomiting.   PROMETHAZINE (PHENERGAN) 12.5 MG TABLET    Take 12.5 mg by mouth every 6 (six) hours as needed for nausea or vomiting.   RIVAROXABAN (XARELTO) 15 MG TABS TABLET    Take 15 mg by mouth daily.   SERTRALINE (ZOLOFT) 50 MG TABLET    Take 50 mg by mouth every evening.   Modified Medications   No medications on file  Discontinued Medications   No medications on file     Physical Exam: Physical Exam  Constitutional: She is oriented to person, place, and time. She appears well-developed and well-nourished. No distress.  HENT:  Head: Normocephalic and atraumatic.  Eyes: Conjunctivae and EOM are normal. Pupils are equal, round, and reactive to light.  Neck: Normal range of motion. Neck supple. No JVD present. No thyromegaly present.  Cardiovascular: Normal rate, regular rhythm and normal heart sounds.   Pulmonary/Chest: Effort normal. She has decreased breath sounds. She has no wheezes. She has no rales.  Decreased breath sounds Bilat. Lungs.   Abdominal: Soft. Bowel sounds are normal. There is no tenderness.  Musculoskeletal: Normal range of motion. She exhibits tenderness (chronic lower back and the right hip since the fx. better controloled with scheduled Norco. ). She exhibits no edema.  Lower back.  Lymphadenopathy:    She has no cervical adenopathy.  Neurological: She is alert and oriented to person, place, and time. She has normal reflexes. No cranial nerve deficit. She exhibits normal muscle tone. Coordination normal.  Skin: Skin is warm and dry. No rash noted. She is not diaphoretic. No erythema (LLE).  The right hip surgical incision healed.   Psychiatric: Her mood appears anxious. Her affect is not angry, not blunt, not labile and not inappropriate. Her speech is not delayed, not tangential and not slurred. She is hyperactive and slowed. She is not agitated, not aggressive,  not withdrawn and not combative. Thought content is not paranoid and not delusional. Cognition and memory are impaired. She does not express impulsivity or inappropriate judgment. She exhibits a depressed mood. She exhibits abnormal recent memory.    Filed Vitals:   01/19/14 1720  BP:  118/62  Pulse: 72  Temp: 97 F (36.1 C)  TempSrc: Tympanic  Resp: 18      Labs reviewed: Basic Metabolic Panel:  Recent Labs  05/27/13 1446 07/26/13 1131 08/28/13  NA 140 144 141  K 4.5 4.0 4.2  CL  --  104  --   CO2 28 23  --   GLUCOSE 121 93  --   BUN 22.8 23 17   CREATININE 1.1 0.84 0.8  CALCIUM 9.5 10.0  --   TSH  --   --  1.32   Liver Function Tests:  Recent Labs  05/15/13 05/27/13 1446 08/28/13  AST 16 19 18   ALT 10 11 12   ALKPHOS 47 63 56  BILITOT  --  0.33  --   PROT  --  6.5  --   ALBUMIN  --  3.6  --    CBC:  Recent Labs  05/27/13 1549  07/26/13 1131  10/16/13 11/18/13 01/15/14  WBC 5.7  < > 7.9  < > 7.4 6.2 6.7  NEUTROABS 3.7  --  7.1  --   --   --   --   HGB 12.6  < > 12.7  < > 11.0* 11.0* 11.8*  HCT 37.9  < > 39.4  < > 34* 37 36  MCV 110.1*  --  112.6*  --   --   --   --   PLT 332  < > 306  < > 442* 358 276  < > = values in this interval not displayed.  Past Procedures:  09/09/12  CT head:  IMPRESSION: No skull fracture or intracranial hemorrhage.  Small vessel disease type changes without CT evidence of large acute infarct.  X-ray right hip  IMPRESSION: Right femoral prosthesis without acute complication  X-ray Lumbar spine  IMPRESSION: Diffuse lumbar spondylosis.  No evidence of fracture.   X--ray R hip  IMPRESSION: Displaced right subcapital hip fracture.  11/25/12 GED: Esophageal dysmotility w/o definitive stricture. S/p empiric dilation in the lower third of the esophagus and across GE junction with TTS balloon to 29mm. Recommended diet as tolerated.   06/25/13 X-ray lumbar spine: disc disease, slight rotoscoliosis curvature to the  left, the is a right hip prosthesis present, there is an eggshell calcification in the right upper quadrant of uncertain etiology.    Assessment/Plan GERD Stable. Omeprazole 20mg  daily.     Achalasia of esophagus Stable. Nifedipine 10mg  tid ac meals.    Anemia of chronic disease 11/18/13 Hgb 11.0 01/15/14 Hgb 11.8   Anxiety and depression Stabilized. Sertraline to 50mg  daily 07/16/13. 08/26/13 Mirtazapine 7.5mg  qhs      BACK PAIN, LUMBAR Walking with walker as tolerated, continues with w/c prn. Continue Norco 10/325mg  qid. F/u [name].  11/10/13 dc Oxycodone prn-not used in 60 days.     Candida esophagitis Fully treated. Clinical stable.   COPD with asthma Managed with Fluticasone-Salmeterol 250/50 bid, dc'd Epipen prn and Immunotherapy per [facility] 01/17/13, Montelukast 10mg  daily, and Albuterol II puffs q6hr prn. Stable     Deep vein thrombosis of left lower extremity Switched to Xeralto since 09/12/12. Left     Essential hypertension, benign On Metoprolol 25mg  bid. Controlled.      Essential thrombocythemia 11/18/13 Plt 358 01/15/14 Plt 276     Family/ Staff Communication: observe the patient  Goals of Care: SNF  Labs/tests ordered: none    Past Medical History  Diagnosis Date  . Macular degeneration   . COPD (chronic obstructive pulmonary  disease)   . Hypertension   . Coronary artery disease   . Hypercholesterolemia   . GERD (gastroesophageal reflux disease)   . Asthmatic bronchitis   . History of colonic polyps   . DJD (degenerative joint disease)   . Lumbar back pain   . Osteoporosis   . History of transient ischemic attack (TIA)   . Anxiety   . Essential thrombocythemia   . Diverticulosis 05/10/2012  . Osteoarthrosis, unspecified whether generalized or localized, unspecified site 05/09/2012  . Memory loss 05/09/2012  . Abnormal weight gain 05/09/2012  . Phlebitis and thrombophlebitis of other deep vessels of lower extremities  07/10/2010  . Essential thrombocythemia 06/09/2009  . Depression   . Esophageal stricture    Past Surgical History  Procedure Laterality Date  . Appendectomy    . Cholecystectomy    . Tah and bso  08/21/1995  . Cataract extraction w/ intraocular lens  implant, bilateral    . Abdominal hysterectomy    . Hip arthroplasty Right 09/09/2012    Procedure: ARTHROPLASTY BIPOLAR HIP;  Surgeon: Mauri Pole, MD;  Location: [facility];  Service: Orthopedics;  Laterality: Right;  . Esophagogastroduodenoscopy (egd) with esophageal dilation N/A 11/25/2012    Procedure: ESOPHAGOGASTRODUODENOSCOPY (EGD) WITH ESOPHAGEAL DILATION;  Surgeon: Jerene Bears, MD;  Location: [facility];  Service: Gastroenterology;  Laterality: N/A;  . Esophagogastroduodenoscopy N/A 07/26/2013    Procedure: ESOPHAGOGASTRODUODENOSCOPY (EGD);  Surgeon: Irene Shipper, MD;  Location[facility] END[facility]COPY;  Service: Endoscopy;  Laterality: N/A;  . Esophageal manometry N/A 08/25/2013    Procedure: ESOPHAGEAL MANOMETRY (EM);  Surgeon: Jerene Bears, MD;  Location: [facility];  Service: Gastroenterology;  Laterality: N/A;  . Esophagogastroduodenoscopy (egd) with propofol N/A 09/18/2013    Procedure: ESOPHAGOGASTRODUODENOSCOPY (EGD) WITH PROPOFOL;  Surgeon: Lafayette Dragon, MD;  Location: [facility];  Service: Endoscopy;  Laterality: N/A;  . Botox injection N/A 09/18/2013    Procedure: BOTOX INJECTION;  Surgeon: Lafayette Dragon, MD;  Location: [facility];  Service: Endoscopy;  Laterality: N/A;   Social History:   reports that she has never smoked. She has never used smokeless tobacco. She reports that she does not drink alcohol or use illicit drugs.  Family History  Problem Relation Age of Onset  . Colon cancer Son 83  . Crohn's disease Son   . Cancer Maternal Aunt     Medications: Patient's Medications  New Prescriptions   No medications on file  Previous Medications   ALBUTEROL (PROVENTIL HFA;VENTOLIN HFA) 108 (90 BASE) MCG/ACT INHALER     Inhale 2 puffs into the lungs every 6 (six) hours as needed for wheezing or shortness of breath.   ATORVASTATIN (LIPITOR) 20 MG TABLET    Take 20 mg by mouth every evening.    CHOLECALCIFEROL (VITAMIN D) 1000 UNITS TABLET    Take 1,000 Units by mouth every morning.   FEEDING SUPPLEMENT (RESOURCE BREEZE) LIQD    Take 90 mLs by mouth 4 (four) times daily.    FLUTICASONE-SALMETEROL (ADVAIR) 250-50 MCG/DOSE AEPB    Inhale 1 puff into the lungs every 12 (twelve) hours.   HYDROCODONE-ACETAMINOPHEN (NORCO) 10-325 MG PER TABLET    Take 1 tablet by mouth 4 (four) times daily.   HYDROXYUREA (HYDREA) 500 MG CAPSULE    Take 500-1,000 mg by mouth See admin instructions. Take 1000 mg on Monday and 500 mg on Sunday, Wednesday and Friday   LORAZEPAM (ATIVAN) 0.5 MG TABLET    Take 0.5 mg by mouth every 4 (four)  hours as needed for anxiety (and nausea).    MEMANTINE HCL ER (NAMENDA XR) 14 MG CP24    Take 14 mg by mouth daily.   METOPROLOL TARTRATE (LOPRESSOR) 25 MG TABLET    Take 25 mg by mouth 2 (two) times daily.   MIRTAZAPINE (REMERON) 7.5 MG TABLET    Take 7.5 mg by mouth at bedtime.   MONTELUKAST (SINGULAIR) 10 MG TABLET    Take 10 mg by mouth at bedtime.   MULTIPLE VITAMINS-MINERALS (CERTAVITE SENIOR/ANTIOXIDANT PO)    Take 1 tablet by mouth every morning.   NIFEDIPINE (PROCARDIA) 10 MG CAPSULE    Take 1 capsule (10 mg total) by mouth 3 (three) times daily before meals.   NITROGLYCERIN (NITROSTAT) 0.4 MG SL TABLET    Place 0.4 mg under the tongue every 5 (five) minutes as needed for chest pain.   OMEPRAZOLE (PRILOSEC) 20 MG CAPSULE    Take 20 mg by mouth every morning.    OXYCODONE (OXY IR/ROXICODONE) 5 MG IMMEDIATE RELEASE TABLET    Take 5 mg by mouth every 4 (four) hours as needed for severe pain.   PROMETHAZINE (PHENERGAN) 12.5 MG SUPPOSITORY    Place 12.5 mg rectally every 6 (six) hours as needed for nausea or vomiting.   PROMETHAZINE (PHENERGAN) 12.5 MG TABLET    Take 12.5 mg by mouth every 6 (six) hours  as needed for nausea or vomiting.   RIVAROXABAN (XARELTO) 15 MG TABS TABLET    Take 15 mg by mouth daily.   SERTRALINE (ZOLOFT) 50 MG TABLET    Take 50 mg by mouth every evening.   Modified Medications   No medications on file  Discontinued Medications   No medications on file     Physical Exam: Physical Exam  Constitutional: She is oriented to person, place, and time. She appears well-developed and well-nourished. No distress.  HENT:  Head: Normocephalic and atraumatic.  Eyes: Conjunctivae and EOM are normal. Pupils are equal, round, and reactive to light.  Neck: Normal range of motion. Neck supple. No JVD present. No thyromegaly present.  Cardiovascular: Normal rate, regular rhythm and normal heart sounds.   Pulmonary/Chest: Effort normal. She has decreased breath sounds. She has no wheezes. She has no rales.  Decreased breath sounds Bilat. Lungs.   Abdominal: Soft. Bowel sounds are normal. There is no tenderness.  Musculoskeletal: Normal range of motion. She exhibits tenderness (chronic lower back and the right hip since the fx. better controloled with scheduled Norco. ). She exhibits no edema.  Lower back-baseline: pain at end of day occasionally.   Lymphadenopathy:    She has no cervical adenopathy.  Neurological: She is alert and oriented to person, place, and time. She has normal reflexes. No cranial nerve deficit. She exhibits normal muscle tone. Coordination normal.  Skin: Skin is warm and dry. No rash noted. She is not diaphoretic. No erythema (LLE).  The right hip surgical incision healed.   Psychiatric: Her mood appears anxious. Her affect is not angry, not blunt, not labile and not inappropriate. Her speech is not delayed, not tangential and not slurred. She is hyperactive and slowed. She is not agitated, not aggressive, not withdrawn and not combative. Thought content is not paranoid and not delusional. Cognition and memory are impaired. She does not express impulsivity or  inappropriate judgment. She exhibits a depressed mood. She exhibits abnormal recent memory.     Filed Vitals:   01/19/14 1720  BP: 118/62  Pulse: 72  Temp: 97 F (  36.1 C)  TempSrc: Tympanic  Resp: 18      Labs reviewed: Basic Metabolic Panel:  Recent Labs  05/27/13 1446 07/26/13 1131 08/28/13  NA 140 144 141  K 4.5 4.0 4.2  CL  --  104  --   CO2 28 23  --   GLUCOSE 121 93  --   BUN 22.8 23 17   CREATININE 1.1 0.84 0.8  CALCIUM 9.5 10.0  --   TSH  --   --  1.32   Liver Function Tests:  Recent Labs  05/15/13 05/27/13 1446 08/28/13  AST 16 19 18   ALT 10 11 12   ALKPHOS 47 63 56  BILITOT  --  0.33  --   PROT  --  6.5  --   ALBUMIN  --  3.6  --    No results for input(s): LIPASE, AMYLASE in the last 8760 hours. No results for input(s): AMMONIA in the last 8760 hours. CBC:  Recent Labs  05/27/13 1549  07/26/13 1131  10/16/13 11/18/13 01/15/14  WBC 5.7  < > 7.9  < > 7.4 6.2 6.7  NEUTROABS 3.7  --  7.1  --   --   --   --   HGB 12.6  < > 12.7  < > 11.0* 11.0* 11.8*  HCT 37.9  < > 39.4  < > 34* 37 36  MCV 110.1*  --  112.6*  --   --   --   --   PLT 332  < > 306  < > 442* 358 276  < > = values in this interval not displayed. Lipid Panel: No results for input(s): CHOL, HDL, LDLCALC, TRIG, CHOLHDL, LDLDIRECT in the last 8760 hours.   Past Procedures:   NA  Assessment/Plan GERD Stable. Omeprazole 20mg  daily.     Achalasia of esophagus Stable. Nifedipine 10mg  tid ac meals.    Anemia of chronic disease 11/18/13 Hgb 11.0 01/15/14 Hgb 11.8   Anxiety and depression Stabilized. Sertraline to 50mg  daily 07/16/13. 08/26/13 Mirtazapine 7.5mg  qhs      BACK PAIN, LUMBAR Walking with walker as tolerated, continues with w/c prn. Continue Norco 10/325mg  qid. F/u [name].  11/10/13 dc Oxycodone prn-not used in 60 days.     Candida esophagitis Fully treated. Clinical stable.   COPD with asthma Managed with Fluticasone-Salmeterol 250/50 bid, dc'd Epipen prn  and Immunotherapy per [facility] 01/17/13, Montelukast 10mg  daily, and Albuterol II puffs q6hr prn. Stable     Deep vein thrombosis of left lower extremity Switched to Xeralto since 09/12/12. Left     Essential hypertension, benign On Metoprolol 25mg  bid. Controlled.      Essential thrombocythemia 11/18/13 Plt 358 01/15/14 Plt 276     Family/ Staff Communication: observe the patient.   Goals of Care: SNF  Labs/tests ordered: none

## 2014-01-19 NOTE — Assessment & Plan Note (Signed)
Managed with Fluticasone-Salmeterol 250/50 bid, dc'd Epipen prn and Immunotherapy per [facility] 01/17/13, Montelukast 10mg  daily, and Albuterol II puffs q6hr prn. Stable

## 2014-01-19 NOTE — Assessment & Plan Note (Signed)
11/18/13 Hgb 11.0 01/15/14 Hgb 11.8  

## 2014-01-19 NOTE — Assessment & Plan Note (Signed)
Fully treated. Clinical stable.

## 2014-01-19 NOTE — Assessment & Plan Note (Signed)
Switched to [place] since 09/12/12. Left   

## 2014-01-19 NOTE — Assessment & Plan Note (Signed)
Stabilized. Sertraline to 50mg daily 07/16/13. 08/26/13 Mirtazapine 7.5mg qhs   

## 2014-01-19 NOTE — Assessment & Plan Note (Signed)
Stable. Nifedipine 10mg tid ac meals.   

## 2014-01-19 NOTE — Assessment & Plan Note (Signed)
11/18/13 Plt 358 01/15/14 Plt 276

## 2014-01-30 ENCOUNTER — Encounter: Payer: Self-pay | Admitting: Internal Medicine

## 2014-02-11 ENCOUNTER — Non-Acute Institutional Stay (SKILLED_NURSING_FACILITY): Payer: Medicare Other | Admitting: Nurse Practitioner

## 2014-02-11 ENCOUNTER — Encounter: Payer: Self-pay | Admitting: Nurse Practitioner

## 2014-02-11 DIAGNOSIS — F419 Anxiety disorder, unspecified: Secondary | ICD-10-CM

## 2014-02-11 DIAGNOSIS — F329 Major depressive disorder, single episode, unspecified: Secondary | ICD-10-CM

## 2014-02-11 DIAGNOSIS — F0390 Unspecified dementia without behavioral disturbance: Secondary | ICD-10-CM

## 2014-02-11 DIAGNOSIS — J449 Chronic obstructive pulmonary disease, unspecified: Secondary | ICD-10-CM

## 2014-02-11 DIAGNOSIS — I10 Essential (primary) hypertension: Secondary | ICD-10-CM

## 2014-02-11 DIAGNOSIS — K219 Gastro-esophageal reflux disease without esophagitis: Secondary | ICD-10-CM

## 2014-02-11 DIAGNOSIS — F418 Other specified anxiety disorders: Secondary | ICD-10-CM

## 2014-02-11 DIAGNOSIS — F32A Depression, unspecified: Secondary | ICD-10-CM

## 2014-02-11 DIAGNOSIS — K220 Achalasia of cardia: Secondary | ICD-10-CM

## 2014-02-11 DIAGNOSIS — D473 Essential (hemorrhagic) thrombocythemia: Secondary | ICD-10-CM

## 2014-02-11 DIAGNOSIS — M5440 Lumbago with sciatica, unspecified side: Secondary | ICD-10-CM

## 2014-02-11 DIAGNOSIS — I82402 Acute embolism and thrombosis of unspecified deep veins of left lower extremity: Secondary | ICD-10-CM

## 2014-02-11 NOTE — Assessment & Plan Note (Signed)
Stable. Nifedipine 10mg tid ac meals.   

## 2014-02-11 NOTE — Assessment & Plan Note (Signed)
Stabilized. Sertraline to 50mg daily 07/16/13. 08/26/13 Mirtazapine 7.5mg qhs   

## 2014-02-11 NOTE — Assessment & Plan Note (Signed)
11/18/13 Plt 358 01/15/14 Plt 276 Takes Hydroxyurea 1000mg q Monday, 500mg Sun, Wed, and Fri   

## 2014-02-11 NOTE — Assessment & Plan Note (Signed)
dc'd Epipen prn and Immunotherapy per [facility] 01/17/13 02/11/14 stable, takes Montelukast 10mg  daily, Breo Ellipta I daily inhaled, Ventolin HFA 2 puffs q6h prn.

## 2014-02-11 NOTE — Assessment & Plan Note (Signed)
Switched to [place] since 09/12/12. Left   

## 2014-02-11 NOTE — Progress Notes (Signed)
Patient ID: Sherri Crawford, female   DOB: [birth year], 80 y.o.   MRN: [id number]   Code Status: DNR  Allergies  Allergen Reactions  . Sulfonamide Derivatives Other (See Comments)    Pt is unsure of reaction    Chief Complaint  Patient presents with  . Medical Management of Chronic Issues    HPI: Patient is a 80 y.o. female seen in the SNF at [facility] today for evaluation of chronic medical conditions.  Problem List Items Addressed This Visit    GERD    Stable. Omeprazole 20mg  daily. Prn Promethazine 12.5mg  q6h available to her.        Essential thrombocythemia    11/18/13 Plt 358 01/15/14 Plt 276 Takes Hydroxyurea 1000mg  q Monday, 500mg  Sun, Wed, and Fri     Essential hypertension    On Metoprolol 25mg  bid. Controlled. Prn NTG available to her-no angina since last visited.       DVT (deep venous thrombosis)    Switched to [place] since 09/12/12. Left       Dementia    Presently taking Namenda 14mg  daily and SNF for care needs      COPD with asthma - Primary    dc'd Epipen prn and Immunotherapy per [facility] 01/17/13 02/11/14 stable, takes Montelukast 10mg  daily, Breo Ellipta I daily inhaled, Ventolin HFA 2 puffs q6h prn.        BACK PAIN, LUMBAR    Walking with walker as tolerated, continues with w/c prn. Continue Norco 10/325mg  qid. F/u [name].  11/10/13 dc Oxycodone prn-not used in 60 days.       Anxiety and depression    Stabilized. Sertraline to 50mg  daily 07/16/13. 08/26/13 Mirtazapine 7.5mg  qhs       Achalasia of esophagus    Stable. Nifedipine 10mg  tid ac meals.         Review of Systems:  Review of Systems  Constitutional: Negative for fever, chills, weight loss, malaise/fatigue and diaphoresis.       # 95-96 in the past 2 months.   HENT: Positive for hearing loss. Negative for congestion, ear discharge, ear pain, nosebleeds, sore throat and tinnitus.   Eyes: Negative for blurred vision, double vision, photophobia, pain,  discharge and redness.  Respiratory: Negative for cough, hemoptysis, sputum production, shortness of breath, wheezing (occasional. ) and stridor.   Cardiovascular: Negative for chest pain, palpitations, orthopnea, claudication and PND. Leg swelling: trace RLE-resolved.   Gastrointestinal: Negative for heartburn, nausea, vomiting, abdominal pain, diarrhea, constipation, blood in stool and melena.       Dysphagia-no problem presently.   Genitourinary: Positive for frequency. Negative for dysuria, urgency, hematuria and flank pain.       X1/night average and no trouble returning to asleep  Musculoskeletal: Positive for joint pain (right hip) and falls (recent and resulted in the right hip fx. ). Negative for myalgias and neck pain. Back pain: chronic back pain is managed with Norco.       Ambulates with walker. Hx of lower back pain: had multiple inj and presently taking Norco 5xday. Worsened lower back pain since her fall 06/22/13-better now  Skin: Negative for itching and rash.          Neurological: Negative for dizziness, tingling, tremors, sensory change, speech change, focal weakness, seizures, loss of consciousness, weakness and headaches.  Endo/Heme/Allergies: Positive for environmental allergies. Negative for polydipsia. Bruises/bleeds easily.  Psychiatric/Behavioral: Positive for depression and memory loss. Negative for suicidal ideas, hallucinations and substance abuse. The  patient is nervous/anxious. The patient does not have insomnia.      Past Medical History  Diagnosis Date  . Macular degeneration   . COPD (chronic obstructive pulmonary disease)   . Hypertension   . Coronary artery disease   . Hypercholesterolemia   . GERD (gastroesophageal reflux disease)   . Asthmatic bronchitis   . History of colonic polyps   . DJD (degenerative joint disease)   . Lumbar back pain   . Osteoporosis   . History of transient ischemic attack (TIA)   . Anxiety   . Essential thrombocythemia     . Diverticulosis 05/10/2012  . Osteoarthrosis, unspecified whether generalized or localized, unspecified site 05/09/2012  . Memory loss 05/09/2012  . Abnormal weight gain 05/09/2012  . Phlebitis and thrombophlebitis of other deep vessels of lower extremities 07/10/2010  . Essential thrombocythemia 06/09/2009  . Depression   . Esophageal stricture    Past Surgical History  Procedure Laterality Date  . Appendectomy    . Cholecystectomy    . Tah and bso  08/21/1995  . Cataract extraction w/ intraocular lens  implant, bilateral    . Abdominal hysterectomy    . Hip arthroplasty Right 09/09/2012    Procedure: ARTHROPLASTY BIPOLAR HIP;  Surgeon: Mauri Pole, MD;  Location: [facility];  Service: Orthopedics;  Laterality: Right;  . Esophagogastroduodenoscopy (egd) with esophageal dilation N/A 11/25/2012    Procedure: ESOPHAGOGASTRODUODENOSCOPY (EGD) WITH ESOPHAGEAL DILATION;  Surgeon: Jerene Bears, MD;  Location: [facility];  Service: Gastroenterology;  Laterality: N/A;  . Esophagogastroduodenoscopy N/A 07/26/2013    Procedure: ESOPHAGOGASTRODUODENOSCOPY (EGD);  Surgeon: Irene Shipper, MD;  Location: [facility] ENDOSCOPY;  Service: Endoscopy;  Laterality: N/A;  . Esophageal manometry N/A 08/25/2013    Procedure: ESOPHAGEAL MANOMETRY (EM);  Surgeon: Jerene Bears, MD;  Location: [facility];  Service: Gastroenterology;  Laterality: N/A;  . Esophagogastroduodenoscopy (egd) with propofol N/A 09/18/2013    Procedure: ESOPHAGOGASTRODUODENOSCOPY (EGD) WITH PROPOFOL;  Surgeon: Lafayette Dragon, MD;  Location: [facility];  Service: Endoscopy;  Laterality: N/A;  . Botox injection N/A 09/18/2013    Procedure: BOTOX INJECTION;  Surgeon: Lafayette Dragon, MD;  Location: [facility];  Service: Endoscopy;  Laterality: N/A;   Social History:   reports that she has never smoked. She has never used smokeless tobacco. She reports that she does not drink alcohol or use illicit drugs.  Family History  Problem Relation Age of Onset   . Colon cancer Son 44  . Crohn's disease Son   . Cancer Maternal Aunt     Medications: Patient's Medications  New Prescriptions   No medications on file  Previous Medications   ALBUTEROL (PROVENTIL HFA;VENTOLIN HFA) 108 (90 BASE) MCG/ACT INHALER    Inhale 2 puffs into the lungs every 6 (six) hours as needed for wheezing or shortness of breath.   ATORVASTATIN (LIPITOR) 20 MG TABLET    Take 20 mg by mouth every evening.    CHOLECALCIFEROL (VITAMIN D) 1000 UNITS TABLET    Take 1,000 Units by mouth every morning.   FEEDING SUPPLEMENT (RESOURCE BREEZE) LIQD    Take 90 mLs by mouth 4 (four) times daily.    FLUTICASONE-SALMETEROL (ADVAIR) 250-50 MCG/DOSE AEPB    Inhale 1 puff into the lungs every 12 (twelve) hours.   HYDROCODONE-ACETAMINOPHEN (NORCO) 10-325 MG PER TABLET    Take 1 tablet by mouth 4 (four) times daily.   HYDROXYUREA (HYDREA) 500 MG CAPSULE    Take 500-1,000 mg by mouth See admin  instructions. Take 1000 mg on Monday and 500 mg on Sunday, Wednesday and Friday   LORAZEPAM (ATIVAN) 0.5 MG TABLET    Take 0.5 mg by mouth every 4 (four) hours as needed for anxiety (and nausea).    MEMANTINE HCL ER (NAMENDA XR) 14 MG CP24    Take 14 mg by mouth daily.   METOPROLOL TARTRATE (LOPRESSOR) 25 MG TABLET    Take 25 mg by mouth 2 (two) times daily.   MIRTAZAPINE (REMERON) 7.5 MG TABLET    Take 7.5 mg by mouth at bedtime.   MONTELUKAST (SINGULAIR) 10 MG TABLET    Take 10 mg by mouth at bedtime.   MULTIPLE VITAMINS-MINERALS (CERTAVITE SENIOR/ANTIOXIDANT PO)    Take 1 tablet by mouth every morning.   NIFEDIPINE (PROCARDIA) 10 MG CAPSULE    Take 1 capsule (10 mg total) by mouth 3 (three) times daily before meals.   NITROGLYCERIN (NITROSTAT) 0.4 MG SL TABLET    Place 0.4 mg under the tongue every 5 (five) minutes as needed for chest pain.   OMEPRAZOLE (PRILOSEC) 20 MG CAPSULE    Take 20 mg by mouth every morning.    OXYCODONE (OXY IR/ROXICODONE) 5 MG IMMEDIATE RELEASE TABLET    Take 5 mg by mouth  every 4 (four) hours as needed for severe pain.   PROMETHAZINE (PHENERGAN) 12.5 MG SUPPOSITORY    Place 12.5 mg rectally every 6 (six) hours as needed for nausea or vomiting.   PROMETHAZINE (PHENERGAN) 12.5 MG TABLET    Take 12.5 mg by mouth every 6 (six) hours as needed for nausea or vomiting.   RIVAROXABAN (XARELTO) 15 MG TABS TABLET    Take 15 mg by mouth daily.   SERTRALINE (ZOLOFT) 50 MG TABLET    Take 50 mg by mouth every evening.   Modified Medications   No medications on file  Discontinued Medications   No medications on file     Physical Exam: Physical Exam  Constitutional: She is oriented to person, place, and time. She appears well-developed and well-nourished. No distress.  HENT:  Head: Normocephalic and atraumatic.  Eyes: Conjunctivae and EOM are normal. Pupils are equal, round, and reactive to light.  Neck: Normal range of motion. Neck supple. No JVD present. No thyromegaly present.  Cardiovascular: Normal rate, regular rhythm and normal heart sounds.   Pulmonary/Chest: Effort normal. She has decreased breath sounds. She has no wheezes. She has no rales.  Decreased breath sounds Bilat. Lungs.   Abdominal: Soft. Bowel sounds are normal. There is no tenderness.  Musculoskeletal: Normal range of motion. She exhibits tenderness (chronic lower back and the right hip since the fx. better controloled with scheduled Norco. ). She exhibits no edema.  Lower back.  Lymphadenopathy:    She has no cervical adenopathy.  Neurological: She is alert and oriented to person, place, and time. She has normal reflexes. No cranial nerve deficit. She exhibits normal muscle tone. Coordination normal.  Skin: Skin is warm and dry. No rash noted. She is not diaphoretic. No erythema (LLE).  The right hip surgical incision healed.   Psychiatric: Her mood appears anxious. Her affect is not angry, not blunt, not labile and not inappropriate. Her speech is not delayed, not tangential and not slurred. She  is hyperactive and slowed. She is not agitated, not aggressive, not withdrawn and not combative. Thought content is not paranoid and not delusional. Cognition and memory are impaired. She does not express impulsivity or inappropriate judgment. She exhibits a depressed mood.  She exhibits abnormal recent memory.    Filed Vitals:   02/11/14 1022  BP: 135/77  Pulse: 78  Temp: 98.4 F (36.9 C)  TempSrc: Tympanic  Resp: 20      Labs reviewed: Basic Metabolic Panel:  Recent Labs  05/27/13 1446 07/26/13 1131 08/28/13  NA 140 144 141  K 4.5 4.0 4.2  CL  --  104  --   CO2 28 23  --   GLUCOSE 121 93  --   BUN 22.8 23 17   CREATININE 1.1 0.84 0.8  CALCIUM 9.5 10.0  --   TSH  --   --  1.32   Liver Function Tests:  Recent Labs  05/15/13 05/27/13 1446 08/28/13  AST 16 19 18   ALT 10 11 12   ALKPHOS 47 63 56  BILITOT  --  0.33  --   PROT  --  6.5  --   ALBUMIN  --  3.6  --    CBC:  Recent Labs  05/27/13 1549  07/26/13 1131  10/16/13 11/18/13 01/15/14  WBC 5.7  < > 7.9  < > 7.4 6.2 6.7  NEUTROABS 3.7  --  7.1  --   --   --   --   HGB 12.6  < > 12.7  < > 11.0* 11.0* 11.8*  HCT 37.9  < > 39.4  < > 34* 37 36  MCV 110.1*  --  112.6*  --   --   --   --   PLT 332  < > 306  < > 442* 358 276  < > = values in this interval not displayed.  Past Procedures:  09/09/12  CT head:  IMPRESSION: No skull fracture or intracranial hemorrhage.  Small vessel disease type changes without CT evidence of large acute infarct.  X-ray right hip  IMPRESSION: Right femoral prosthesis without acute complication  X-ray Lumbar spine  IMPRESSION: Diffuse lumbar spondylosis.  No evidence of fracture.   X--ray R hip  IMPRESSION: Displaced right subcapital hip fracture.  11/25/12 GED: Esophageal dysmotility w/o definitive stricture. S/p empiric dilation in the lower third of the esophagus and across GE junction with TTS balloon to 28mm. Recommended diet as tolerated.   06/25/13 X-ray  lumbar spine: disc disease, slight rotoscoliosis curvature to the left, the is a right hip prosthesis present, there is an eggshell calcification in the right upper quadrant of uncertain etiology.    Assessment/Plan Essential thrombocythemia 11/18/13 Plt 358 01/15/14 Plt 276 Takes Hydroxyurea 1000mg  q Monday, 500mg  Sun, Wed, and Fri   Dementia Presently taking Namenda 14mg  daily and SNF for care needs    DVT (deep venous thrombosis) Switched to [place] since 09/12/12. Left     Anxiety and depression Stabilized. Sertraline to 50mg  daily 07/16/13. 08/26/13 Mirtazapine 7.5mg  qhs     COPD with asthma dc'd Epipen prn and Immunotherapy per [facility] 01/17/13 02/11/14 stable, takes Montelukast 10mg  daily, Breo Ellipta I daily inhaled, Ventolin HFA 2 puffs q6h prn.      Essential hypertension On Metoprolol 25mg  bid. Controlled. Prn NTG available to her-no angina since last visited.     Achalasia of esophagus Stable. Nifedipine 10mg  tid ac meals.    BACK PAIN, LUMBAR Walking with walker as tolerated, continues with w/c prn. Continue Norco 10/325mg  qid. F/u [name].  11/10/13 dc Oxycodone prn-not used in 60 days.     GERD Stable. Omeprazole 20mg  daily. Prn Promethazine 12.5mg  q6h available to her.        Family/  Staff Communication: observe the patient  Goals of Care: SNF  Labs/tests ordered: none    Past Medical History  Diagnosis Date  . Macular degeneration   . COPD (chronic obstructive pulmonary disease)   . Hypertension   . Coronary artery disease   . Hypercholesterolemia   . GERD (gastroesophageal reflux disease)   . Asthmatic bronchitis   . History of colonic polyps   . DJD (degenerative joint disease)   . Lumbar back pain   . Osteoporosis   . History of transient ischemic attack (TIA)   . Anxiety   . Essential thrombocythemia   . Diverticulosis 05/10/2012  . Osteoarthrosis, unspecified whether generalized or localized, unspecified site  05/09/2012  . Memory loss 05/09/2012  . Abnormal weight gain 05/09/2012  . Phlebitis and thrombophlebitis of other deep vessels of lower extremities 07/10/2010  . Essential thrombocythemia 06/09/2009  . Depression   . Esophageal stricture    Past Surgical History  Procedure Laterality Date  . Appendectomy    . Cholecystectomy    . [place] and bso  08/21/1995  . Cataract extraction w/ intraocular lens  implant, bilateral    . Abdominal hysterectomy    . Hip arthroplasty Right 09/09/2012    Procedure: ARTHROPLASTY BIPOLAR HIP;  Surgeon: Mauri Pole, MD;  Location: [facility];  Service: Orthopedics;  Laterality: Right;  . Esophagogastroduodenoscopy (egd) with esophageal dilation N/A 11/25/2012    Procedure: ESOPHAGOGASTRODUODENOSCOPY (EGD) WITH ESOPHAGEAL DILATION;  Surgeon: Jerene Bears, MD;  [facility]: [facility];  Service: Gastroenterology;  Laterality: N/A;  . Esophagogastroduodenoscopy N/A 07/26/2013    Procedure: ESOPHAGOGASTRODUODENOSCOPY (EGD);  Surgeon: Irene Shipper, MD;  Location[facility]COPY;  Service: Endoscopy;  Laterality: N/A;  . Esophageal manometry N/A 08/25/2013    Procedure: ESOPHAGEAL MANOMETRY (EM);  Surgeon: Jerene Bears, MD;  [facility]: [facility];  Service: Gastroenterology;  Laterality: N/A;  . Esophagogastroduodenoscopy (egd) with propofol N/A 09/18/2013    Procedure: ESOPHAGOGASTRODUODENOSCOPY (EGD) WITH PROPOFOL;  Surgeon: Lafayette Dragon, MD;  [facility]: [facility];  Service: Endoscopy;  Laterality: N/A;  . Botox injection N/A 09/18/2013    Procedure: BOTOX INJECTION;  Surgeon: Lafayette Dragon, MD;  Location: [facility];  Service: Endoscopy;  Laterality: N/A;   Social History:   reports that she has never smoked. She has never used smokeless tobacco. She reports that she does not drink alcohol or use illicit drugs.  Family History  Problem Relation Age of Onset  . Colon cancer Son 49  . Crohn's disease Son   . Cancer Maternal Aunt     Medications: Patient's  Medications  New Prescriptions   No medications on file  Previous Medications   ALBUTEROL (PROVENTIL HFA;VENTOLIN HFA) 108 (90 BASE) MCG/ACT INHALER    Inhale 2 puffs into the lungs every 6 (six) hours as needed for wheezing or shortness of breath.   ATORVASTATIN (LIPITOR) 20 MG TABLET    Take 20 mg by mouth every evening.    CHOLECALCIFEROL (VITAMIN D) 1000 UNITS TABLET    Take 1,000 Units by mouth every morning.   FEEDING SUPPLEMENT (RESOURCE BREEZE) LIQD    Take 90 mLs by mouth 4 (four) times daily.    FLUTICASONE-SALMETEROL (ADVAIR) 250-50 MCG/DOSE AEPB    Inhale 1 puff into the lungs every 12 (twelve) hours.   HYDROCODONE-ACETAMINOPHEN (NORCO) 10-325 MG PER TABLET    Take 1 tablet by mouth 4 (four) times daily.   HYDROXYUREA (HYDREA) 500 MG CAPSULE    Take 500-1,000 mg by mouth  See admin instructions. Take 1000 mg on Monday and 500 mg on Sunday, Wednesday and Friday   LORAZEPAM (ATIVAN) 0.5 MG TABLET    Take 0.5 mg by mouth every 4 (four) hours as needed for anxiety (and nausea).    MEMANTINE HCL ER (NAMENDA XR) 14 MG CP24    Take 14 mg by mouth daily.   METOPROLOL TARTRATE (LOPRESSOR) 25 MG TABLET    Take 25 mg by mouth 2 (two) times daily.   MIRTAZAPINE (REMERON) 7.5 MG TABLET    Take 7.5 mg by mouth at bedtime.   MONTELUKAST (SINGULAIR) 10 MG TABLET    Take 10 mg by mouth at bedtime.   MULTIPLE VITAMINS-MINERALS (CERTAVITE SENIOR/ANTIOXIDANT PO)    Take 1 tablet by mouth every morning.   NIFEDIPINE (PROCARDIA) 10 MG CAPSULE    Take 1 capsule (10 mg total) by mouth 3 (three) times daily before meals.   NITROGLYCERIN (NITROSTAT) 0.4 MG SL TABLET    Place 0.4 mg under the tongue every 5 (five) minutes as needed for chest pain.   OMEPRAZOLE (PRILOSEC) 20 MG CAPSULE    Take 20 mg by mouth every morning.    OXYCODONE (OXY IR/ROXICODONE) 5 MG IMMEDIATE RELEASE TABLET    Take 5 mg by mouth every 4 (four) hours as needed for severe pain.   PROMETHAZINE (PHENERGAN) 12.5 MG SUPPOSITORY    Place  12.5 mg rectally every 6 (six) hours as needed for nausea or vomiting.   PROMETHAZINE (PHENERGAN) 12.5 MG TABLET    Take 12.5 mg by mouth every 6 (six) hours as needed for nausea or vomiting.   RIVAROXABAN (XARELTO) 15 MG TABS TABLET    Take 15 mg by mouth daily.   SERTRALINE (ZOLOFT) 50 MG TABLET    Take 50 mg by mouth every evening.   Modified Medications   No medications on file  Discontinued Medications   No medications on file     Physical Exam: Physical Exam  Constitutional: She is oriented to person, place, and time. She appears well-developed and well-nourished. No distress.  HENT:  Head: Normocephalic and atraumatic.  Eyes: Conjunctivae and EOM are normal. Pupils are equal, round, and reactive to light.  Neck: Normal range of motion. Neck supple. No JVD present. No thyromegaly present.  Cardiovascular: Normal rate, regular rhythm and normal heart sounds.   Pulmonary/Chest: Effort normal. She has decreased breath sounds. She has no wheezes. She has no rales.  Decreased breath sounds Bilat. Lungs.   Abdominal: Soft. Bowel sounds are normal. There is no tenderness.  Musculoskeletal: Normal range of motion. She exhibits tenderness (chronic lower back and the right hip since the fx. better controloled with scheduled Norco. ). She exhibits no edema.  Lower back-baseline: pain at end of day occasionally.   Lymphadenopathy:    She has no cervical adenopathy.  Neurological: She is alert and oriented to person, place, and time. She has normal reflexes. No cranial nerve deficit. She exhibits normal muscle tone. Coordination normal.  Skin: Skin is warm and dry. No rash noted. She is not diaphoretic. No erythema (LLE).  The right hip surgical incision healed.   Psychiatric: Her mood appears anxious. Her affect is not angry, not blunt, not labile and not inappropriate. Her speech is not delayed, not tangential and not slurred. She is hyperactive and slowed. She is not agitated, not  aggressive, not withdrawn and not combative. Thought content is not paranoid and not delusional. Cognition and memory are impaired. She does not express  impulsivity or inappropriate judgment. She exhibits a depressed mood. She exhibits abnormal recent memory.     Filed Vitals:   02/11/14 1022  BP: 135/77  Pulse: 78  Temp: 98.4 F (36.9 C)  TempSrc: Tympanic  Resp: 20      Labs reviewed: Basic Metabolic Panel:  Recent Labs  05/27/13 1446 07/26/13 1131 08/28/13  NA 140 144 141  K 4.5 4.0 4.2  CL  --  104  --   CO2 28 23  --   GLUCOSE 121 93  --   BUN 22.8 23 17   CREATININE 1.1 0.84 0.8  CALCIUM 9.5 10.0  --   TSH  --   --  1.32   Liver Function Tests:  Recent Labs  05/15/13 05/27/13 1446 08/28/13  AST 16 19 18   ALT 10 11 12   ALKPHOS 47 63 56  BILITOT  --  0.33  --   PROT  --  6.5  --   ALBUMIN  --  3.6  --    No results for input(s): LIPASE, AMYLASE in the last 8760 hours. No results for input(s): AMMONIA in the last 8760 hours. CBC:  Recent Labs  05/27/13 1549  07/26/13 1131  10/16/13 11/18/13 01/15/14  WBC 5.7  < > 7.9  < > 7.4 6.2 6.7  NEUTROABS 3.7  --  7.1  --   --   --   --   HGB 12.6  < > 12.7  < > 11.0* 11.0* 11.8*  HCT 37.9  < > 39.4  < > 34* 37 36  MCV 110.1*  --  112.6*  --   --   --   --   PLT 332  < > 306  < > 442* 358 276  < > = values in this interval not displayed. Lipid Panel: No results for input(s): CHOL, HDL, LDLCALC, TRIG, CHOLHDL, LDLDIRECT in the last 8760 hours.   Past Procedures:   NA  Assessment/Plan Essential thrombocythemia 11/18/13 Plt 358 01/15/14 Plt 276 Takes Hydroxyurea 1000mg  q Monday, 500mg  Sun, Wed, and Fri   Dementia Presently taking Namenda 14mg  daily and SNF for care needs    DVT (deep venous thrombosis) Switched to [place] since 09/12/12. Left     Anxiety and depression Stabilized. Sertraline to 50mg  daily 07/16/13. 08/26/13 Mirtazapine 7.5mg  qhs     COPD with asthma dc'd Epipen prn and  Immunotherapy per [facility] 01/17/13 02/11/14 stable, takes Montelukast 10mg  daily, Breo Ellipta I daily inhaled, Ventolin HFA 2 puffs q6h prn.      Essential hypertension On Metoprolol 25mg  bid. Controlled. Prn NTG available to her-no angina since last visited.     Achalasia of esophagus Stable. Nifedipine 10mg  tid ac meals.    BACK PAIN, LUMBAR Walking with walker as tolerated, continues with w/c prn. Continue Norco 10/325mg  qid. F/u [name].  11/10/13 dc Oxycodone prn-not used in 60 days.     GERD Stable. Omeprazole 20mg  daily. Prn Promethazine 12.5mg  q6h available to her.        Family/ Staff Communication: observe the patient.   Goals of Care: SNF  Labs/tests ordered: none

## 2014-02-11 NOTE — Assessment & Plan Note (Signed)
Presently taking Namenda 14mg daily and SNF for care needs  

## 2014-02-11 NOTE — Assessment & Plan Note (Addendum)
Stable. Omeprazole 20mg daily. Prn Promethazine 12.5mg q6h available to her.   

## 2014-02-11 NOTE — Assessment & Plan Note (Signed)
Walking with walker as tolerated, continues with w/c prn. Continue Norco 10/325mg  qid. F/u [name].  11/10/13 dc Oxycodone prn-not used in 60 days.

## 2014-02-11 NOTE — Assessment & Plan Note (Addendum)
On Metoprolol 25mg bid. Controlled. Prn NTG available to her-no angina since last visited.     

## 2014-03-16 ENCOUNTER — Non-Acute Institutional Stay (SKILLED_NURSING_FACILITY): Payer: Medicare Other | Admitting: Nurse Practitioner

## 2014-03-16 ENCOUNTER — Encounter: Payer: Self-pay | Admitting: Nurse Practitioner

## 2014-03-16 DIAGNOSIS — K220 Achalasia of cardia: Secondary | ICD-10-CM

## 2014-03-16 DIAGNOSIS — F418 Other specified anxiety disorders: Secondary | ICD-10-CM

## 2014-03-16 DIAGNOSIS — I10 Essential (primary) hypertension: Secondary | ICD-10-CM

## 2014-03-16 DIAGNOSIS — F329 Major depressive disorder, single episode, unspecified: Secondary | ICD-10-CM

## 2014-03-16 DIAGNOSIS — I82402 Acute embolism and thrombosis of unspecified deep veins of left lower extremity: Secondary | ICD-10-CM

## 2014-03-16 DIAGNOSIS — K219 Gastro-esophageal reflux disease without esophagitis: Secondary | ICD-10-CM

## 2014-03-16 DIAGNOSIS — F419 Anxiety disorder, unspecified: Secondary | ICD-10-CM

## 2014-03-16 DIAGNOSIS — D473 Essential (hemorrhagic) thrombocythemia: Secondary | ICD-10-CM

## 2014-03-16 DIAGNOSIS — J449 Chronic obstructive pulmonary disease, unspecified: Secondary | ICD-10-CM

## 2014-03-16 DIAGNOSIS — D638 Anemia in other chronic diseases classified elsewhere: Secondary | ICD-10-CM

## 2014-03-16 DIAGNOSIS — F32A Depression, unspecified: Secondary | ICD-10-CM

## 2014-03-16 DIAGNOSIS — M5440 Lumbago with sciatica, unspecified side: Secondary | ICD-10-CM

## 2014-03-16 NOTE — Progress Notes (Signed)
Patient ID: Sherri Crawford, female   DOB: 28-Dec-1920, 81 y.o.   MRN: [id number]   Code Status: DNR  Allergies  Allergen Reactions  . Sulfonamide Derivatives Other (See Comments)    Pt is unsure of reaction    Chief Complaint  Patient presents with  . Medical Management of Chronic Issues    HPI: Patient is a 81 y.o. female seen in the SNF at [facility] today for evaluation of chronic medical conditions.  Problem List Items Addressed This Visit    GERD    Stable. Omeprazole 20mg  daily. Prn Promethazine 12.5mg  q6h available to her.          Essential thrombocythemia - Primary    11/18/13 Plt 358 01/15/14 Plt 276 Takes Hydroxyurea 1000mg  q Monday, 500mg  Sun, Wed, and Fri        Essential hypertension, benign    On Metoprolol 25mg  bid. Controlled. Prn NTG available to her-no angina since last visited.          Essential hypertension    On Metoprolol 25mg  bid. Controlled. Prn NTG available to her-no angina since last visited.        DVT (deep venous thrombosis)    Switched to [place] since 09/12/12. Left        COPD with asthma    dc'd Epipen prn and Immunotherapy per [facility] 01/17/13 02/11/14 stable, takes Montelukast 10mg  daily, Breo Ellipta I daily inhaled, Ventolin HFA 2 puffs q6h prn.  Stable.       BACK PAIN, LUMBAR    Walking with walker as tolerated, continues with w/c prn. Continue Norco 10/325mg  qid. F/u [name].  11/10/13 dc Oxycodone prn-not used in 60 days.  Pain is reasonable controlled.         Anxiety and depression    Stabilized. Sertraline to 50mg  daily 07/16/13. 08/26/13 Mirtazapine 7.5mg  qhs        Anemia of chronic disease    11/18/13 Hgb 11.0 01/15/14 Hgb 11.8       Achalasia of esophagus    Stable. Nifedipine 10mg  tid ac meals.           Review of Systems:  Review of Systems  Constitutional: Negative for fever, chills, weight loss, malaise/fatigue and diaphoresis.  HENT: Positive for hearing loss.  Negative for congestion, ear discharge, ear pain, nosebleeds, sore throat and tinnitus.   Eyes: Negative for blurred vision, double vision, photophobia, pain, discharge and redness.  Respiratory: Positive for cough. Negative for hemoptysis, sputum production, shortness of breath, wheezing and stridor.        Chronic occasional hacking cough.   Cardiovascular: Negative for chest pain, palpitations, orthopnea, claudication, leg swelling and PND.  Gastrointestinal: Negative for heartburn, nausea, vomiting, abdominal pain, diarrhea, constipation, blood in stool and melena.  Genitourinary: Negative for dysuria, urgency, frequency, hematuria and flank pain.       1-2x/night.   Musculoskeletal: Positive for back pain and joint pain. Negative for myalgias, falls and neck pain.       R knee pain. Back pain is well controlled except sometimes experiences more in the evenings. Able to sleep through night  Skin: Negative for itching and rash.  Neurological: Negative for dizziness, tingling, tremors, sensory change, speech change, focal weakness, seizures, loss of consciousness, weakness and headaches.  Endo/Heme/Allergies: Negative for environmental allergies and polydipsia. Bruises/bleeds easily.  Psychiatric/Behavioral: Positive for depression and memory loss. Negative for suicidal ideas, hallucinations and substance abuse. The patient is nervous/anxious. The patient does not have insomnia.  Past Medical History  Diagnosis Date  . Macular degeneration   . COPD (chronic obstructive pulmonary disease)   . Hypertension   . Coronary artery disease   . Hypercholesterolemia   . GERD (gastroesophageal reflux disease)   . Asthmatic bronchitis   . History of colonic polyps   . DJD (degenerative joint disease)   . Lumbar back pain   . Osteoporosis   . History of transient ischemic attack (TIA)   . Anxiety   . Essential thrombocythemia   . Diverticulosis 05/10/2012  . Osteoarthrosis, unspecified  whether generalized or localized, unspecified site 05/09/2012  . Memory loss 05/09/2012  . Abnormal weight gain 05/09/2012  . Phlebitis and thrombophlebitis of other deep vessels of lower extremities 07/10/2010  . Essential thrombocythemia 06/09/2009  . Depression   . Esophageal stricture    Past Surgical History  Procedure Laterality Date  . Appendectomy    . Cholecystectomy    . Tah and bso  08/21/1995  . Cataract extraction w/ intraocular lens  implant, bilateral    . Abdominal hysterectomy    . Hip arthroplasty Right 09/09/2012    Procedure: ARTHROPLASTY BIPOLAR HIP;  Surgeon: Mauri Pole, MD;  Location: [facility];  Service: Orthopedics;  Laterality: Right;  . Esophagogastroduodenoscopy (egd) with esophageal dilation N/A 11/25/2012    Procedure: ESOPHAGOGASTRODUODENOSCOPY (EGD) WITH ESOPHAGEAL DILATION;  Surgeon: Jerene Bears, MD;  Location: [facility];  Service: Gastroenterology;  Laterality: N/A;  . Esophagogastroduodenoscopy N/A 07/26/2013    Procedure: ESOPHAGOGASTRODUODENOSCOPY (EGD);  Surgeon: Irene Shipper, MD;  Location: [facility] ENDOSCOPY;  Service: Endoscopy;  Laterality: N/A;  . Esophageal manometry N/A 08/25/2013    Procedure: ESOPHAGEAL MANOMETRY (EM);  Surgeon: Jerene Bears, MD;  Location: [facility];  Service: Gastroenterology;  Laterality: N/A;  . Esophagogastroduodenoscopy (egd) with propofol N/A 09/18/2013    Procedure: ESOPHAGOGASTRODUODENOSCOPY (EGD) WITH PROPOFOL;  Surgeon: Lafayette Dragon, MD;  Location: [facility];  Service: Endoscopy;  Laterality: N/A;  . Botox injection N/A 09/18/2013    Procedure: BOTOX INJECTION;  Surgeon: Lafayette Dragon, MD;  Location: [facility];  Service: Endoscopy;  Laterality: N/A;   Social History:   reports that she has never smoked. She has never used smokeless tobacco. She reports that she does not drink alcohol or use illicit drugs.  Family History  Problem Relation Age of Onset  . Colon cancer Son 51  . Crohn's disease Son   . Cancer  Maternal Aunt     Medications: Patient's Medications  New Prescriptions   No medications on file  Previous Medications   ALBUTEROL (PROVENTIL HFA;VENTOLIN HFA) 108 (90 BASE) MCG/ACT INHALER    Inhale 2 puffs into the lungs every 6 (six) hours as needed for wheezing or shortness of breath.   ATORVASTATIN (LIPITOR) 20 MG TABLET    Take 20 mg by mouth every evening.    CHOLECALCIFEROL (VITAMIN D) 1000 UNITS TABLET    Take 1,000 Units by mouth every morning.   FEEDING SUPPLEMENT (RESOURCE BREEZE) LIQD    Take 90 mLs by mouth 4 (four) times daily.    FLUTICASONE-SALMETEROL (ADVAIR) 250-50 MCG/DOSE AEPB    Inhale 1 puff into the lungs every 12 (twelve) hours.   HYDROCODONE-ACETAMINOPHEN (NORCO) 10-325 MG PER TABLET    Take 1 tablet by mouth 4 (four) times daily.   HYDROXYUREA (HYDREA) 500 MG CAPSULE    Take 500-1,000 mg by mouth See admin instructions. Take 1000 mg on Monday and 500 mg on Sunday, Wednesday and Friday  LORAZEPAM (ATIVAN) 0.5 MG TABLET    Take 0.5 mg by mouth every 4 (four) hours as needed for anxiety (and nausea).    MEMANTINE HCL ER (NAMENDA XR) 14 MG CP24    Take 14 mg by mouth daily.   METOPROLOL TARTRATE (LOPRESSOR) 25 MG TABLET    Take 25 mg by mouth 2 (two) times daily.   MIRTAZAPINE (REMERON) 7.5 MG TABLET    Take 7.5 mg by mouth at bedtime.   MONTELUKAST (SINGULAIR) 10 MG TABLET    Take 10 mg by mouth at bedtime.   MULTIPLE VITAMINS-MINERALS (CERTAVITE SENIOR/ANTIOXIDANT PO)    Take 1 tablet by mouth every morning.   NIFEDIPINE (PROCARDIA) 10 MG CAPSULE    Take 1 capsule (10 mg total) by mouth 3 (three) times daily before meals.   NITROGLYCERIN (NITROSTAT) 0.4 MG SL TABLET    Place 0.4 mg under the tongue every 5 (five) minutes as needed for chest pain.   OMEPRAZOLE (PRILOSEC) 20 MG CAPSULE    Take 20 mg by mouth every morning.    OXYCODONE (OXY IR/ROXICODONE) 5 MG IMMEDIATE RELEASE TABLET    Take 5 mg by mouth every 4 (four) hours as needed for severe pain.   PROMETHAZINE  (PHENERGAN) 12.5 MG SUPPOSITORY    Place 12.5 mg rectally every 6 (six) hours as needed for nausea or vomiting.   PROMETHAZINE (PHENERGAN) 12.5 MG TABLET    Take 12.5 mg by mouth every 6 (six) hours as needed for nausea or vomiting.   RIVAROXABAN (XARELTO) 15 MG TABS TABLET    Take 15 mg by mouth daily.   SERTRALINE (ZOLOFT) 50 MG TABLET    Take 50 mg by mouth every evening.   Modified Medications   No medications on file  Discontinued Medications   No medications on file     Physical Exam: Physical Exam  Constitutional: She is oriented to person, place, and time. She appears well-developed and well-nourished. No distress.  HENT:  Head: Normocephalic and atraumatic.  Eyes: Conjunctivae and EOM are normal. Pupils are equal, round, and reactive to light.  Neck: Normal range of motion. Neck supple. No JVD present. No thyromegaly present.  Cardiovascular: Normal rate, regular rhythm and normal heart sounds.   Pulmonary/Chest: Effort normal. She has decreased breath sounds. She has no wheezes. She has no rales.  Decreased breath sounds Bilat. Lungs.   Abdominal: Soft. Bowel sounds are normal. There is no tenderness.  Musculoskeletal: Normal range of motion. She exhibits tenderness (chronic lower back and the right hip since the fx. better controloled with scheduled Norco. ). She exhibits no edema.  Lower back.  Lymphadenopathy:    She has no cervical adenopathy.  Neurological: She is alert and oriented to person, place, and time. She has normal reflexes. No cranial nerve deficit. She exhibits normal muscle tone. Coordination normal.  Skin: Skin is warm and dry. No rash noted. She is not diaphoretic. No erythema (LLE).  The right hip surgical incision healed.   Psychiatric: Her mood appears anxious. Her affect is not angry, not blunt, not labile and not inappropriate. Her speech is not delayed, not tangential and not slurred. She is hyperactive and slowed. She is not agitated, not aggressive,  not withdrawn and not combative. Thought content is not paranoid and not delusional. Cognition and memory are impaired. She does not express impulsivity or inappropriate judgment. She exhibits a depressed mood. She exhibits abnormal recent memory.    Filed Vitals:   03/16/14 1701  BP:  150/84  Pulse: 64  Temp: 98 F (36.7 C)  TempSrc: Tympanic  Resp: 18      Labs reviewed: Basic Metabolic Panel:  Recent Labs  05/27/13 1446 07/26/13 1131 08/28/13  NA 140 144 141  K 4.5 4.0 4.2  CL  --  104  --   CO2 28 23  --   GLUCOSE 121 93  --   BUN 22.8 23 17   CREATININE 1.1 0.84 0.8  CALCIUM 9.5 10.0  --   TSH  --   --  1.32   Liver Function Tests:  Recent Labs  05/15/13 05/27/13 1446 08/28/13  AST 16 19 18   ALT 10 11 12   ALKPHOS 47 63 56  BILITOT  --  0.33  --   PROT  --  6.5  --   ALBUMIN  --  3.6  --    CBC:  Recent Labs  05/27/13 1549  07/26/13 1131  10/16/13 11/18/13 01/15/14  WBC 5.7  < > 7.9  < > 7.4 6.2 6.7  NEUTROABS 3.7  --  7.1  --   --   --   --   HGB 12.6  < > 12.7  < > 11.0* 11.0* 11.8*  HCT 37.9  < > 39.4  < > 34* 37 36  MCV 110.1*  --  112.6*  --   --   --   --   PLT 332  < > 306  < > 442* 358 276  < > = values in this interval not displayed.  Past Procedures:  09/09/12  CT head:  IMPRESSION: No skull fracture or intracranial hemorrhage.  Small vessel disease type changes without CT evidence of large acute infarct.  X-ray right hip  IMPRESSION: Right femoral prosthesis without acute complication  X-ray Lumbar spine  IMPRESSION: Diffuse lumbar spondylosis.  No evidence of fracture.   X--ray R hip  IMPRESSION: Displaced right subcapital hip fracture.  11/25/12 GED: Esophageal dysmotility w/o definitive stricture. S/p empiric dilation in the lower third of the esophagus and across GE junction with TTS balloon to 35mm. Recommended diet as tolerated.   06/25/13 X-ray lumbar spine: disc disease, slight rotoscoliosis curvature to the  left, the is a right hip prosthesis present, there is an eggshell calcification in the right upper quadrant of uncertain etiology.    Assessment/Plan Essential thrombocythemia 11/18/13 Plt 358 01/15/14 Plt 276 Takes Hydroxyurea 1000mg  q Monday, 500mg  Sun, Wed, and Fri     Essential hypertension On Metoprolol 25mg  bid. Controlled. Prn NTG available to her-no angina since last visited.     COPD with asthma dc'd Epipen prn and Immunotherapy per [facility] 01/17/13 02/11/14 stable, takes Montelukast 10mg  daily, Breo Ellipta I daily inhaled, Ventolin HFA 2 puffs q6h prn.  Stable.    GERD Stable. Omeprazole 20mg  daily. Prn Promethazine 12.5mg  q6h available to her.       BACK PAIN, LUMBAR Walking with walker as tolerated, continues with w/c prn. Continue Norco 10/325mg  qid. F/u [name].  11/10/13 dc Oxycodone prn-not used in 60 days.  Pain is reasonable controlled.      DVT (deep venous thrombosis) Switched to [place] since 09/12/12. Left     Anxiety and depression Stabilized. Sertraline to 50mg  daily 07/16/13. 08/26/13 Mirtazapine 7.5mg  qhs     Achalasia of esophagus Stable. Nifedipine 10mg  tid ac meals.     Essential hypertension, benign On Metoprolol 25mg  bid. Controlled. Prn NTG available to her-no angina since last visited.  Anemia of chronic disease 11/18/13 Hgb 11.0 01/15/14 Hgb 11.8      Family/ Staff Communication: observe the patient  Goals of Care: SNF  Labs/tests ordered: none    Past Medical History  Diagnosis Date  . Macular degeneration   . COPD (chronic obstructive pulmonary disease)   . Hypertension   . Coronary artery disease   . Hypercholesterolemia   . GERD (gastroesophageal reflux disease)   . Asthmatic bronchitis   . History of colonic polyps   . DJD (degenerative joint disease)   . Lumbar back pain   . Osteoporosis   . History of transient ischemic attack (TIA)   . Anxiety   . Essential thrombocythemia   .  Diverticulosis 05/10/2012  . Osteoarthrosis, unspecified whether generalized or localized, unspecified site 05/09/2012  . Memory loss 05/09/2012  . Abnormal weight gain 05/09/2012  . Phlebitis and thrombophlebitis of other deep vessels of lower extremities 07/10/2010  . Essential thrombocythemia 06/09/2009  . Depression   . Esophageal stricture    Past Surgical History  Procedure Laterality Date  . Appendectomy    . Cholecystectomy    . Tah and bso  08/21/1995  . Cataract extraction w/ intraocular lens  implant, bilateral    . Abdominal hysterectomy    . Hip arthroplasty Right 09/09/2012    Procedure: ARTHROPLASTY BIPOLAR HIP;  Surgeon: Mauri Pole, MD;  Location: [facility];  Service: Orthopedics;  Laterality: Right;  . Esophagogastroduodenoscopy (egd) with esophageal dilation N/A 11/25/2012    Procedure: ESOPHAGOGASTRODUODENOSCOPY (EGD) WITH ESOPHAGEAL DILATION;  Surgeon: Jerene Bears, MD;  Location: [facility];  Service: Gastroenterology;  Laterality: N/A;  . Esophagogastroduodenoscopy N/A 07/26/2013    Procedure: ESOPHAGOGASTRODUODENOSCOPY (EGD);  Surgeon: Irene Shipper, MD;  Location: [facility] ENDOSCOPY;  Service: Endoscopy;  Laterality: N/A;  . Esophageal manometry N/A 08/25/2013    Procedure: ESOPHAGEAL MANOMETRY (EM);  Surgeon: Jerene Bears, MD;  Location: [facility];  Service: Gastroenterology;  Laterality: N/A;  . Esophagogastroduodenoscopy (egd) with propofol N/A 09/18/2013    Procedure: ESOPHAGOGASTRODUODENOSCOPY (EGD) WITH PROPOFOL;  Surgeon: Lafayette Dragon, MD;  Location: [facility];  Service: Endoscopy;  Laterality: N/A;  . Botox injection N/A 09/18/2013    Procedure: BOTOX INJECTION;  Surgeon: Lafayette Dragon, MD;  Location: [facility];  Service: Endoscopy;  Laterality: N/A;   Social History:   reports that she has never smoked. She has never used smokeless tobacco. She reports that she does not drink alcohol or use illicit drugs.  Family History  Problem Relation Age of Onset  .  Colon cancer Son 67  . Crohn's disease Son   . Cancer Maternal Aunt     Medications: Patient's Medications  New Prescriptions   No medications on file  Previous Medications   ALBUTEROL (PROVENTIL HFA;VENTOLIN HFA) 108 (90 BASE) MCG/ACT INHALER    Inhale 2 puffs into the lungs every 6 (six) hours as needed for wheezing or shortness of breath.   ATORVASTATIN (LIPITOR) 20 MG TABLET    Take 20 mg by mouth every evening.    CHOLECALCIFEROL (VITAMIN D) 1000 UNITS TABLET    Take 1,000 Units by mouth every morning.   FEEDING SUPPLEMENT (RESOURCE BREEZE) LIQD    Take 90 mLs by mouth 4 (four) times daily.    FLUTICASONE-SALMETEROL (ADVAIR) 250-50 MCG/DOSE AEPB    Inhale 1 puff into the lungs every 12 (twelve) hours.   HYDROCODONE-ACETAMINOPHEN (NORCO) 10-325 MG PER TABLET    Take 1 tablet by mouth 4 (four) times  daily.   HYDROXYUREA (HYDREA) 500 MG CAPSULE    Take 500-1,000 mg by mouth See admin instructions. Take 1000 mg on Monday and 500 mg on Sunday, Wednesday and Friday   LORAZEPAM (ATIVAN) 0.5 MG TABLET    Take 0.5 mg by mouth every 4 (four) hours as needed for anxiety (and nausea).    MEMANTINE HCL ER (NAMENDA XR) 14 MG CP24    Take 14 mg by mouth daily.   METOPROLOL TARTRATE (LOPRESSOR) 25 MG TABLET    Take 25 mg by mouth 2 (two) times daily.   MIRTAZAPINE (REMERON) 7.5 MG TABLET    Take 7.5 mg by mouth at bedtime.   MONTELUKAST (SINGULAIR) 10 MG TABLET    Take 10 mg by mouth at bedtime.   MULTIPLE VITAMINS-MINERALS (CERTAVITE SENIOR/ANTIOXIDANT PO)    Take 1 tablet by mouth every morning.   NIFEDIPINE (PROCARDIA) 10 MG CAPSULE    Take 1 capsule (10 mg total) by mouth 3 (three) times daily before meals.   NITROGLYCERIN (NITROSTAT) 0.4 MG SL TABLET    Place 0.4 mg under the tongue every 5 (five) minutes as needed for chest pain.   OMEPRAZOLE (PRILOSEC) 20 MG CAPSULE    Take 20 mg by mouth every morning.    OXYCODONE (OXY IR/ROXICODONE) 5 MG IMMEDIATE RELEASE TABLET    Take 5 mg by mouth every 4  (four) hours as needed for severe pain.   PROMETHAZINE (PHENERGAN) 12.5 MG SUPPOSITORY    Place 12.5 mg rectally every 6 (six) hours as needed for nausea or vomiting.   PROMETHAZINE (PHENERGAN) 12.5 MG TABLET    Take 12.5 mg by mouth every 6 (six) hours as needed for nausea or vomiting.   RIVAROXABAN (XARELTO) 15 MG TABS TABLET    Take 15 mg by mouth daily.   SERTRALINE (ZOLOFT) 50 MG TABLET    Take 50 mg by mouth every evening.   Modified Medications   No medications on file  Discontinued Medications   No medications on file     Physical Exam: Physical Exam  Constitutional: She is oriented to person, place, and time. She appears well-developed and well-nourished. No distress.  HENT:  Head: Normocephalic and atraumatic.  Eyes: Conjunctivae and EOM are normal. Pupils are equal, round, and reactive to light.  Neck: Normal range of motion. Neck supple. No JVD present. No thyromegaly present.  Cardiovascular: Normal rate, regular rhythm and normal heart sounds.   Pulmonary/Chest: Effort normal. She has decreased breath sounds. She has no wheezes. She has no rales.  Decreased breath sounds Bilat. Lungs.   Abdominal: Soft. Bowel sounds are normal. There is no tenderness.  Musculoskeletal: Normal range of motion. She exhibits tenderness (chronic lower back and the right hip since the fx. better controloled with scheduled Norco. ). She exhibits no edema.  Lower back-baseline: pain at end of day occasionally.   Lymphadenopathy:    She has no cervical adenopathy.  Neurological: She is alert and oriented to person, place, and time. She has normal reflexes. No cranial nerve deficit. She exhibits normal muscle tone. Coordination normal.  Skin: Skin is warm and dry. No rash noted. She is not diaphoretic. No erythema (LLE).  The right hip surgical incision healed.   Psychiatric: Her mood appears anxious. Her affect is not angry, not blunt, not labile and not inappropriate. Her speech is not delayed,  not tangential and not slurred. She is hyperactive and slowed. She is not agitated, not aggressive, not withdrawn and not combative. Thought  content is not paranoid and not delusional. Cognition and memory are impaired. She does not express impulsivity or inappropriate judgment. She exhibits a depressed mood. She exhibits abnormal recent memory.     Filed Vitals:   03/16/14 1701  BP: 150/84  Pulse: 64  Temp: 98 F (36.7 C)  TempSrc: Tympanic  Resp: 18      Labs reviewed: Basic Metabolic Panel:  Recent Labs  05/27/13 1446 07/26/13 1131 08/28/13  NA 140 144 141  K 4.5 4.0 4.2  CL  --  104  --   CO2 28 23  --   GLUCOSE 121 93  --   BUN 22.8 23 17   CREATININE 1.1 0.84 0.8  CALCIUM 9.5 10.0  --   TSH  --   --  1.32   Liver Function Tests:  Recent Labs  05/15/13 05/27/13 1446 08/28/13  AST 16 19 18   ALT 10 11 12   ALKPHOS 47 63 56  BILITOT  --  0.33  --   PROT  --  6.5  --   ALBUMIN  --  3.6  --    No results for input(s): LIPASE, AMYLASE in the last 8760 hours. No results for input(s): AMMONIA in the last 8760 hours. CBC:  Recent Labs  05/27/13 1549  07/26/13 1131  10/16/13 11/18/13 01/15/14  WBC 5.7  < > 7.9  < > 7.4 6.2 6.7  NEUTROABS 3.7  --  7.1  --   --   --   --   HGB 12.6  < > 12.7  < > 11.0* 11.0* 11.8*  HCT 37.9  < > 39.4  < > 34* 37 36  MCV 110.1*  --  112.6*  --   --   --   --   PLT 332  < > 306  < > 442* 358 276  < > = values in this interval not displayed. Lipid Panel: No results for input(s): CHOL, HDL, LDLCALC, TRIG, CHOLHDL, LDLDIRECT in the last 8760 hours.   Past Procedures:   NA  Assessment/Plan Essential thrombocythemia 11/18/13 Plt 358 01/15/14 Plt 276 Takes Hydroxyurea 1000mg  q Monday, 500mg  Sun, Wed, and Fri     Essential hypertension On Metoprolol 25mg  bid. Controlled. Prn NTG available to her-no angina since last visited.     COPD with asthma dc'd Epipen prn and Immunotherapy per [facility] 01/17/13 02/11/14  stable, takes Montelukast 10mg  daily, Breo Ellipta I daily inhaled, Ventolin HFA 2 puffs q6h prn.  Stable.    GERD Stable. Omeprazole 20mg  daily. Prn Promethazine 12.5mg  q6h available to her.       BACK PAIN, LUMBAR Walking with walker as tolerated, continues with w/c prn. Continue Norco 10/325mg  qid. F/u [name].  11/10/13 dc Oxycodone prn-not used in 60 days.  Pain is reasonable controlled.      DVT (deep venous thrombosis) Switched to [place] since 09/12/12. Left     Anxiety and depression Stabilized. Sertraline to 50mg  daily 07/16/13. 08/26/13 Mirtazapine 7.5mg  qhs     Achalasia of esophagus Stable. Nifedipine 10mg  tid ac meals.     Essential hypertension, benign On Metoprolol 25mg  bid. Controlled. Prn NTG available to her-no angina since last visited.       Anemia of chronic disease 11/18/13 Hgb 11.0 01/15/14 Hgb 11.8      Family/ Staff Communication: observe the patient.   Goals of Care: SNF  Labs/tests ordered: none

## 2014-03-17 LAB — CBC AND DIFFERENTIAL
HCT: 41 % (ref 36–46)
HEMOGLOBIN: 13.9 g/dL (ref 12.0–16.0)
PLATELETS: 349 10*3/uL (ref 150–399)
WBC: 7.9 10*3/mL

## 2014-03-17 NOTE — Assessment & Plan Note (Signed)
Switched to [place] since 09/12/12. Left

## 2014-03-17 NOTE — Assessment & Plan Note (Signed)
On Metoprolol 25mg  bid. Controlled. Prn NTG available to her-no angina since last visited.

## 2014-03-17 NOTE — Assessment & Plan Note (Signed)
Stable. Nifedipine 10mg  tid ac meals.

## 2014-03-17 NOTE — Assessment & Plan Note (Signed)
Stable. Omeprazole 20mg  daily. Prn Promethazine 12.5mg  q6h available to her.

## 2014-03-17 NOTE — Assessment & Plan Note (Signed)
Walking with walker as tolerated, continues with w/c prn. Continue Norco 10/325mg  qid. F/u [name].  11/10/13 dc Oxycodone prn-not used in 60 days.  Pain is reasonable controlled.

## 2014-03-17 NOTE — Assessment & Plan Note (Signed)
11/18/13 Hgb 11.0 01/15/14 Hgb 11.8

## 2014-03-17 NOTE — Assessment & Plan Note (Signed)
dc'd Epipen prn and Immunotherapy per [facility] 01/17/13 02/11/14 stable, takes Montelukast 10mg  daily, Breo Ellipta I daily inhaled, Ventolin HFA 2 puffs q6h prn.  Stable.

## 2014-03-17 NOTE — Assessment & Plan Note (Signed)
Stabilized. Sertraline to 50mg  daily 07/16/13. 08/26/13 Mirtazapine 7.5mg  qhs

## 2014-03-17 NOTE — Assessment & Plan Note (Signed)
11/18/13 Plt 358 01/15/14 Plt 276 Takes Hydroxyurea 1000mg  q Monday, 500mg  Sun, Wed, and Fri

## 2014-03-18 ENCOUNTER — Other Ambulatory Visit: Payer: Self-pay | Admitting: Nurse Practitioner

## 2014-03-18 DIAGNOSIS — D638 Anemia in other chronic diseases classified elsewhere: Secondary | ICD-10-CM

## 2014-03-18 DIAGNOSIS — D473 Essential (hemorrhagic) thrombocythemia: Secondary | ICD-10-CM

## 2014-04-13 ENCOUNTER — Encounter: Payer: Self-pay | Admitting: Nurse Practitioner

## 2014-04-13 ENCOUNTER — Non-Acute Institutional Stay (SKILLED_NURSING_FACILITY): Payer: Medicare Other | Admitting: Nurse Practitioner

## 2014-04-13 DIAGNOSIS — F0390 Unspecified dementia without behavioral disturbance: Secondary | ICD-10-CM

## 2014-04-13 DIAGNOSIS — K219 Gastro-esophageal reflux disease without esophagitis: Secondary | ICD-10-CM

## 2014-04-13 DIAGNOSIS — K220 Achalasia of cardia: Secondary | ICD-10-CM

## 2014-04-13 DIAGNOSIS — D473 Essential (hemorrhagic) thrombocythemia: Secondary | ICD-10-CM

## 2014-04-13 DIAGNOSIS — I10 Essential (primary) hypertension: Secondary | ICD-10-CM

## 2014-04-13 DIAGNOSIS — F329 Major depressive disorder, single episode, unspecified: Secondary | ICD-10-CM

## 2014-04-13 DIAGNOSIS — M5440 Lumbago with sciatica, unspecified side: Secondary | ICD-10-CM

## 2014-04-13 DIAGNOSIS — F32A Depression, unspecified: Secondary | ICD-10-CM

## 2014-04-13 DIAGNOSIS — F419 Anxiety disorder, unspecified: Secondary | ICD-10-CM

## 2014-04-13 DIAGNOSIS — J449 Chronic obstructive pulmonary disease, unspecified: Secondary | ICD-10-CM

## 2014-04-13 DIAGNOSIS — F418 Other specified anxiety disorders: Secondary | ICD-10-CM

## 2014-04-13 DIAGNOSIS — I82402 Acute embolism and thrombosis of unspecified deep veins of left lower extremity: Secondary | ICD-10-CM

## 2014-04-13 DIAGNOSIS — R634 Abnormal weight loss: Secondary | ICD-10-CM

## 2014-04-13 NOTE — Assessment & Plan Note (Signed)
PTL in 300s.  Takes Hydroxyurea 1000mg  q Monday, 500mg  Sun, Wed, and Fri

## 2014-04-13 NOTE — Assessment & Plan Note (Signed)
dc'd Epipen prn and Immunotherapy per [facility] 01/17/13 02/11/14 stable, takes Montelukast 10mg  daily, Breo Ellipta I daily inhaled, Ventolin HFA 2 puffs q6h prn.  Stable.

## 2014-04-13 NOTE — Assessment & Plan Note (Signed)
Stable. Nifedipine 10mg  tid ac meals.

## 2014-04-13 NOTE — Assessment & Plan Note (Signed)
Presently taking Namenda 14mg  daily and SNF for care needs

## 2014-04-13 NOTE — Assessment & Plan Note (Signed)
On Metoprolol 25mg  bid. Controlled. Prn NTG available to her-no angina since last visited.

## 2014-04-13 NOTE — Assessment & Plan Note (Signed)
Stabilized. Sertraline to 50mg  daily 07/16/13. 08/26/13 Mirtazapine 7.5mg  qhs  04/13/14 taper off Mirtazapine.

## 2014-04-13 NOTE — Assessment & Plan Note (Signed)
Switched to [place] since 09/12/12. Left

## 2014-04-13 NOTE — Assessment & Plan Note (Signed)
Stable. Omeprazole 20mg  daily. Prn Promethazine 12.5mg  q6h available to her.

## 2014-04-13 NOTE — Assessment & Plan Note (Signed)
04/13/14 Pharm: taper off Mirtazapine. Has gained weight-#108 Ibs.

## 2014-04-13 NOTE — Assessment & Plan Note (Signed)
Walking with walker as tolerated, continues with w/c prn. Continue Norco 10/325mg  qid. F/u [name].  11/10/13 dc Oxycodone prn-not used in 60 days.  Pain is reasonable controlled.

## 2014-04-13 NOTE — Progress Notes (Signed)
Patient ID: Sherri Crawford, female   DOB: 04-16-1920, 81 y.o.   MRN: [id number]   Code Status: DNR  Allergies  Allergen Reactions  . Sulfonamide Derivatives Other (See Comments)    Pt is unsure of reaction    Chief Complaint  Patient presents with  . Medical Management of Chronic Issues    HPI: Patient is a 81 y.o. female seen in the SNF at [facility] today for evaluation of chronic medical conditions.  Problem List Items Addressed This Visit    Loss of weight    04/13/14 Pharm: taper off Mirtazapine. Has gained weight-#108 Ibs.        GERD    Stable. Omeprazole 20mg  daily. Prn Promethazine 12.5mg  q6h available to her.         Essential thrombocythemia    PTL in 300s.  Takes Hydroxyurea 1000mg  q Monday, 500mg  Sun, Wed, and Fri        Essential hypertension - Primary    On Metoprolol 25mg  bid. Controlled. Prn NTG available to her-no angina since last visited.         Dementia    Presently taking Namenda 14mg  daily and SNF for care needs        Deep vein thrombosis of left lower extremity    Switched to [place] since 09/12/12. Left         COPD with asthma    dc'd Epipen prn and Immunotherapy per [facility] 01/17/13 02/11/14 stable, takes Montelukast 10mg  daily, Breo Ellipta I daily inhaled, Ventolin HFA 2 puffs q6h prn.  Stable.        BACK PAIN, LUMBAR    Walking with walker as tolerated, continues with w/c prn. Continue Norco 10/325mg  qid. F/u [name].  11/10/13 dc Oxycodone prn-not used in 60 days.  Pain is reasonable controlled.      Anxiety and depression    Stabilized. Sertraline to 50mg  daily 07/16/13. 08/26/13 Mirtazapine 7.5mg  qhs  04/13/14 taper off Mirtazapine.         Achalasia of esophagus    Stable. Nifedipine 10mg  tid ac meals.            Review of Systems:  Review of Systems  Constitutional: Negative for fever, chills, weight loss, malaise/fatigue and diaphoresis.  HENT: Positive for hearing loss. Negative  for congestion, ear discharge, ear pain, nosebleeds, sore throat and tinnitus.   Eyes: Negative for blurred vision, double vision, photophobia, pain, discharge and redness.  Respiratory: Positive for cough. Negative for hemoptysis, sputum production, shortness of breath, wheezing and stridor.   Cardiovascular: Negative for chest pain, palpitations, orthopnea, claudication, leg swelling and PND.  Gastrointestinal: Negative for heartburn, nausea, vomiting, abdominal pain, diarrhea, constipation, blood in stool and melena.  Genitourinary: Positive for frequency. Negative for dysuria, urgency, hematuria and flank pain.  Musculoskeletal: Positive for back pain. Negative for myalgias, joint pain, falls and neck pain.  Skin: Negative for itching and rash.  Neurological: Negative for dizziness, tingling, tremors, sensory change, speech change, focal weakness, seizures, loss of consciousness, weakness and headaches.  Endo/Heme/Allergies: Negative for environmental allergies and polydipsia. Does not bruise/bleed easily.  Psychiatric/Behavioral: Positive for depression and memory loss. Negative for suicidal ideas, hallucinations and substance abuse. The patient is nervous/anxious and has insomnia.      Past Medical History  Diagnosis Date  . Macular degeneration   . COPD (chronic obstructive pulmonary disease)   . Hypertension   . Coronary artery disease   . Hypercholesterolemia   . GERD (gastroesophageal reflux  disease)   . Asthmatic bronchitis   . History of colonic polyps   . DJD (degenerative joint disease)   . Lumbar back pain   . Osteoporosis   . History of transient ischemic attack (TIA)   . Anxiety   . Essential thrombocythemia   . Diverticulosis 05/10/2012  . Osteoarthrosis, unspecified whether generalized or localized, unspecified site 05/09/2012  . Memory loss 05/09/2012  . Abnormal weight gain 05/09/2012  . Phlebitis and thrombophlebitis of other deep vessels of lower extremities  07/10/2010  . Essential thrombocythemia 06/09/2009  . Depression   . Esophageal stricture    Past Surgical History  Procedure Laterality Date  . Appendectomy    . Cholecystectomy    . [place] and bso  08/21/1995  . Cataract extraction w/ intraocular lens  implant, bilateral    . Abdominal hysterectomy    . Hip arthroplasty Right 09/09/2012    Procedure: ARTHROPLASTY BIPOLAR HIP;  Surgeon: Mauri Pole, MD;  Location: [facility];  Service: Orthopedics;  Laterality: Right;  . Esophagogastroduodenoscopy (egd) with esophageal dilation N/A 11/25/2012    Procedure: ESOPHAGOGASTRODUODENOSCOPY (EGD) WITH ESOPHAGEAL DILATION;  Surgeon: Jerene Bears, MD;  Location: [facility];  Service: Gastroenterology;  Laterality: N/A;  . Esophagogastroduodenoscopy N/A 07/26/2013    Procedure: ESOPHAGOGASTRODUODENOSCOPY (EGD);  Surgeon: Irene Shipper, MD;  Location: [facility] ENDOSCOPY;  Service: Endoscopy;  Laterality: N/A;  . Esophageal manometry N/A 08/25/2013    Procedure: ESOPHAGEAL MANOMETRY (EM);  Surgeon: Jerene Bears, MD;  Location: [facility];  Service: Gastroenterology;  Laterality: N/A;  . Esophagogastroduodenoscopy (egd) with propofol N/A 09/18/2013    Procedure: ESOPHAGOGASTRODUODENOSCOPY (EGD) WITH PROPOFOL;  Surgeon: Lafayette Dragon, MD;  Location: [facility];  Service: Endoscopy;  Laterality: N/A;  . Botox injection N/A 09/18/2013    Procedure: BOTOX INJECTION;  Surgeon: Lafayette Dragon, MD;  Location: [facility];  Service: Endoscopy;  Laterality: N/A;   Social History:   reports that she has never smoked. She has never used smokeless tobacco. She reports that she does not drink alcohol or use illicit drugs.  Family History  Problem Relation Age of Onset  . Colon cancer Son 1  . Crohn's disease Son   . Cancer Maternal Aunt     Medications: Patient's Medications  New Prescriptions   No medications on file  Previous Medications   ALBUTEROL (PROVENTIL HFA;VENTOLIN HFA) 108 (90 BASE) MCG/ACT INHALER     Inhale 2 puffs into the lungs every 6 (six) hours as needed for wheezing or shortness of breath.   ATORVASTATIN (LIPITOR) 20 MG TABLET    Take 20 mg by mouth every evening.    CHOLECALCIFEROL (VITAMIN D) 1000 UNITS TABLET    Take 1,000 Units by mouth every morning.   FEEDING SUPPLEMENT (RESOURCE BREEZE) LIQD    Take 90 mLs by mouth 4 (four) times daily.    FLUTICASONE-SALMETEROL (ADVAIR) 250-50 MCG/DOSE AEPB    Inhale 1 puff into the lungs every 12 (twelve) hours.   HYDROCODONE-ACETAMINOPHEN (NORCO) 10-325 MG PER TABLET    Take 1 tablet by mouth 4 (four) times daily.   HYDROXYUREA (HYDREA) 500 MG CAPSULE    Take 500-1,000 mg by mouth See admin instructions. Take 1000 mg on Monday and 500 mg on Sunday, Wednesday and Friday   LORAZEPAM (ATIVAN) 0.5 MG TABLET    Take 0.5 mg by mouth every 4 (four) hours as needed for anxiety (and nausea).    MEMANTINE HCL ER (NAMENDA XR) 14 MG CP24  Take 14 mg by mouth daily.   METOPROLOL TARTRATE (LOPRESSOR) 25 MG TABLET    Take 25 mg by mouth 2 (two) times daily.   MONTELUKAST (SINGULAIR) 10 MG TABLET    Take 10 mg by mouth at bedtime.   MULTIPLE VITAMINS-MINERALS (CERTAVITE SENIOR/ANTIOXIDANT PO)    Take 1 tablet by mouth every morning.   NIFEDIPINE (PROCARDIA) 10 MG CAPSULE    Take 1 capsule (10 mg total) by mouth 3 (three) times daily before meals.   NITROGLYCERIN (NITROSTAT) 0.4 MG SL TABLET    Place 0.4 mg under the tongue every 5 (five) minutes as needed for chest pain.   OMEPRAZOLE (PRILOSEC) 20 MG CAPSULE    Take 20 mg by mouth every morning.    OXYCODONE (OXY IR/ROXICODONE) 5 MG IMMEDIATE RELEASE TABLET    Take 5 mg by mouth every 4 (four) hours as needed for severe pain.   PROMETHAZINE (PHENERGAN) 12.5 MG SUPPOSITORY    Place 12.5 mg rectally every 6 (six) hours as needed for nausea or vomiting.   PROMETHAZINE (PHENERGAN) 12.5 MG TABLET    Take 12.5 mg by mouth every 6 (six) hours as needed for nausea or vomiting.   RIVAROXABAN (XARELTO) 15 MG TABS  TABLET    Take 15 mg by mouth daily.   SERTRALINE (ZOLOFT) 50 MG TABLET    Take 50 mg by mouth every evening.   Modified Medications   No medications on file  Discontinued Medications   MIRTAZAPINE (REMERON) 7.5 MG TABLET    Take 7.5 mg by mouth at bedtime.     Physical Exam: Physical Exam  Constitutional: She is oriented to person, place, and time. She appears well-developed and well-nourished. No distress.  HENT:  Head: Normocephalic and atraumatic.  Eyes: Conjunctivae and EOM are normal. Pupils are equal, round, and reactive to light.  Neck: Normal range of motion. Neck supple. No JVD present. No thyromegaly present.  Cardiovascular: Normal rate, regular rhythm and normal heart sounds.   Pulmonary/Chest: Effort normal. She has decreased breath sounds. She has no wheezes. She has no rales.  Decreased breath sounds Bilat. Lungs.   Abdominal: Soft. Bowel sounds are normal. There is no tenderness.  Musculoskeletal: Normal range of motion. She exhibits tenderness (chronic lower back and the right hip since the fx. better controloled with scheduled Norco. ). She exhibits no edema.  Lower back.  Lymphadenopathy:    She has no cervical adenopathy.  Neurological: She is alert and oriented to person, place, and time. She has normal reflexes. No cranial nerve deficit. She exhibits normal muscle tone. Coordination normal.  Skin: Skin is warm and dry. No rash noted. She is not diaphoretic. No erythema (LLE).  The right hip surgical incision healed.   Psychiatric: Her mood appears anxious. Her affect is not angry, not blunt, not labile and not inappropriate. Her speech is not delayed, not tangential and not slurred. She is hyperactive and slowed. She is not agitated, not aggressive, not withdrawn and not combative. Thought content is not paranoid and not delusional. Cognition and memory are impaired. She does not express impulsivity or inappropriate judgment. She exhibits a depressed mood. She  exhibits abnormal recent memory.    Filed Vitals:   04/13/14 1316  BP: 128/66  Pulse: 70  Temp: 98.6 F (37 C)  TempSrc: Tympanic  Resp: 18      Labs reviewed: Basic Metabolic Panel:  Recent Labs  05/27/13 1446 07/26/13 1131 08/28/13  NA 140 144 141  K 4.5  4.0 4.2  CL  --  104  --   CO2 28 23  --   GLUCOSE 121 93  --   BUN 22.8 23 17   CREATININE 1.1 0.84 0.8  CALCIUM 9.5 10.0  --   TSH  --   --  1.32   Liver Function Tests:  Recent Labs  05/15/13 05/27/13 1446 08/28/13  AST 16 19 18   ALT 10 11 12   ALKPHOS 47 63 56  BILITOT  --  0.33  --   PROT  --  6.5  --   ALBUMIN  --  3.6  --    CBC:  Recent Labs  05/27/13 1549  07/26/13 1131  11/18/13 01/15/14 03/17/14  WBC 5.7  < > 7.9  < > 6.2 6.7 7.9  NEUTROABS 3.7  --  7.1  --   --   --   --   HGB 12.6  < > 12.7  < > 11.0* 11.8* 13.9  HCT 37.9  < > 39.4  < > 37 36 41  MCV 110.1*  --  112.6*  --   --   --   --   PLT 332  < > 306  < > 358 276 349  < > = values in this interval not displayed.  Past Procedures:  09/09/12  CT head:  IMPRESSION: No skull fracture or intracranial hemorrhage.  Small vessel disease type changes without CT evidence of large acute infarct.  X-ray right hip  IMPRESSION: Right femoral prosthesis without acute complication  X-ray Lumbar spine  IMPRESSION: Diffuse lumbar spondylosis.  No evidence of fracture.   X--ray R hip  IMPRESSION: Displaced right subcapital hip fracture.  11/25/12 GED: Esophageal dysmotility w/o definitive stricture. S/p empiric dilation in the lower third of the esophagus and across GE junction with TTS balloon to 18mm. Recommended diet as tolerated.   06/25/13 X-ray lumbar spine: disc disease, slight rotoscoliosis curvature to the left, the is a right hip prosthesis present, there is an eggshell calcification in the right upper quadrant of uncertain etiology.    Assessment/Plan Loss of weight 04/13/14 Pharm: taper off Mirtazapine. Has gained  weight-#108 Ibs.     Anxiety and depression Stabilized. Sertraline to 50mg  daily 07/16/13. 08/26/13 Mirtazapine 7.5mg  qhs  04/13/14 taper off Mirtazapine.      Essential thrombocythemia PTL in 300s.  Takes Hydroxyurea 1000mg  q Monday, 500mg  Sun, Wed, and Fri     COPD with asthma dc'd Epipen prn and Immunotherapy per [facility] 01/17/13 02/11/14 stable, takes Montelukast 10mg  daily, Breo Ellipta I daily inhaled, Ventolin HFA 2 puffs q6h prn.  Stable.     Dementia Presently taking Namenda 14mg  daily and SNF for care needs     Deep vein thrombosis of left lower extremity Switched to [place] since 09/12/12. Left      Essential hypertension On Metoprolol 25mg  bid. Controlled. Prn NTG available to her-no angina since last visited.      Achalasia of esophagus Stable. Nifedipine 10mg  tid ac meals.      BACK PAIN, LUMBAR Walking with walker as tolerated, continues with w/c prn. Continue Norco 10/325mg  qid. F/u [name].  11/10/13 dc Oxycodone prn-not used in 60 days.  Pain is reasonable controlled.   GERD Stable. Omeprazole 20mg  daily. Prn Promethazine 12.5mg  q6h available to her.        Family/ Staff Communication: observe the patient  Goals of Care: SNF  Labs/tests ordered: none    Past Medical History  Diagnosis Date  .  Macular degeneration   . COPD (chronic obstructive pulmonary disease)   . Hypertension   . Coronary artery disease   . Hypercholesterolemia   . GERD (gastroesophageal reflux disease)   . Asthmatic bronchitis   . History of colonic polyps   . DJD (degenerative joint disease)   . Lumbar back pain   . Osteoporosis   . History of transient ischemic attack (TIA)   . Anxiety   . Essential thrombocythemia   . Diverticulosis 05/10/2012  . Osteoarthrosis, unspecified whether generalized or localized, unspecified site 05/09/2012  . Memory loss 05/09/2012  . Abnormal weight gain 05/09/2012  . Phlebitis and thrombophlebitis of other  deep vessels of lower extremities 07/10/2010  . Essential thrombocythemia 06/09/2009  . Depression   . Esophageal stricture    Past Surgical History  Procedure Laterality Date  . Appendectomy    . Cholecystectomy    . [place] and bso  08/21/1995  . Cataract extraction w/ intraocular lens  implant, bilateral    . Abdominal hysterectomy    . Hip arthroplasty Right 09/09/2012    Procedure: ARTHROPLASTY BIPOLAR HIP;  Surgeon: Mauri Pole, MD;  Location: [facility];  Service: Orthopedics;  Laterality: Right;  . Esophagogastroduodenoscopy (egd) with esophageal dilation N/A 11/25/2012    Procedure: ESOPHAGOGASTRODUODENOSCOPY (EGD) WITH ESOPHAGEAL DILATION;  Surgeon: Jerene Bears, MD;  Location: [facility];  Service: Gastroenterology;  Laterality: N/A;  . Esophagogastroduodenoscopy N/A 07/26/2013    Procedure: ESOPHAGOGASTRODUODENOSCOPY (EGD);  Surgeon: Irene Shipper, MD;  Location[facility];  Service: Endoscopy;  Laterality: N/A;  . Esophageal manometry N/A 08/25/2013    Procedure: ESOPHAGEAL MANOMETRY (EM);  Surgeon: Jerene Bears, MD;  Location: [facility];  Service: Gastroenterology;  Laterality: N/A;  . Esophagogastroduodenoscopy (egd) with propofol N/A 09/18/2013    Procedure: ESOPHAGOGASTRODUODENOSCOPY (EGD) WITH PROPOFOL;  Surgeon: Lafayette Dragon, MD;  Location: [facility];  Service: Endoscopy;  Laterality: N/A;  . Botox injection N/A 09/18/2013    Procedure: BOTOX INJECTION;  Surgeon: Lafayette Dragon, MD;  Location: [facility];  Service: Endoscopy;  Laterality: N/A;   Social History:   reports that she has never smoked. She has never used smokeless tobacco. She reports that she does not drink alcohol or use illicit drugs.  Family History  Problem Relation Age of Onset  . Colon cancer Son 84  . Crohn's disease Son   . Cancer Maternal Aunt     Medications: Patient's Medications  New Prescriptions   No medications on file  Previous Medications   ALBUTEROL (PROVENTIL HFA;VENTOLIN HFA)  108 (90 BASE) MCG/ACT INHALER    Inhale 2 puffs into the lungs every 6 (six) hours as needed for wheezing or shortness of breath.   ATORVASTATIN (LIPITOR) 20 MG TABLET    Take 20 mg by mouth every evening.    CHOLECALCIFEROL (VITAMIN D) 1000 UNITS TABLET    Take 1,000 Units by mouth every morning.   FEEDING SUPPLEMENT (RESOURCE BREEZE) LIQD    Take 90 mLs by mouth 4 (four) times daily.    FLUTICASONE-SALMETEROL (ADVAIR) 250-50 MCG/DOSE AEPB    Inhale 1 puff into the lungs every 12 (twelve) hours.   HYDROCODONE-ACETAMINOPHEN (NORCO) 10-325 MG PER TABLET    Take 1 tablet by mouth 4 (four) times daily.   HYDROXYUREA (HYDREA) 500 MG CAPSULE    Take 500-1,000 mg by mouth See admin instructions. Take 1000 mg on Monday and 500 mg on Sunday, Wednesday and Friday   LORAZEPAM (ATIVAN) 0.5 MG TABLET  Take 0.5 mg by mouth every 4 (four) hours as needed for anxiety (and nausea).    MEMANTINE HCL ER (NAMENDA XR) 14 MG CP24    Take 14 mg by mouth daily.   METOPROLOL TARTRATE (LOPRESSOR) 25 MG TABLET    Take 25 mg by mouth 2 (two) times daily.   MONTELUKAST (SINGULAIR) 10 MG TABLET    Take 10 mg by mouth at bedtime.   MULTIPLE VITAMINS-MINERALS (CERTAVITE SENIOR/ANTIOXIDANT PO)    Take 1 tablet by mouth every morning.   NIFEDIPINE (PROCARDIA) 10 MG CAPSULE    Take 1 capsule (10 mg total) by mouth 3 (three) times daily before meals.   NITROGLYCERIN (NITROSTAT) 0.4 MG SL TABLET    Place 0.4 mg under the tongue every 5 (five) minutes as needed for chest pain.   OMEPRAZOLE (PRILOSEC) 20 MG CAPSULE    Take 20 mg by mouth every morning.    OXYCODONE (OXY IR/ROXICODONE) 5 MG IMMEDIATE RELEASE TABLET    Take 5 mg by mouth every 4 (four) hours as needed for severe pain.   PROMETHAZINE (PHENERGAN) 12.5 MG SUPPOSITORY    Place 12.5 mg rectally every 6 (six) hours as needed for nausea or vomiting.   PROMETHAZINE (PHENERGAN) 12.5 MG TABLET    Take 12.5 mg by mouth every 6 (six) hours as needed for nausea or vomiting.    RIVAROXABAN (XARELTO) 15 MG TABS TABLET    Take 15 mg by mouth daily.   SERTRALINE (ZOLOFT) 50 MG TABLET    Take 50 mg by mouth every evening.   Modified Medications   No medications on file  Discontinued Medications   MIRTAZAPINE (REMERON) 7.5 MG TABLET    Take 7.5 mg by mouth at bedtime.     Physical Exam: Physical Exam  Constitutional: She is oriented to person, place, and time. She appears well-developed and well-nourished. No distress.  HENT:  Head: Normocephalic and atraumatic.  Eyes: Conjunctivae and EOM are normal. Pupils are equal, round, and reactive to light.  Neck: Normal range of motion. Neck supple. No JVD present. No thyromegaly present.  Cardiovascular: Normal rate, regular rhythm and normal heart sounds.   Pulmonary/Chest: Effort normal. She has decreased breath sounds. She has no wheezes. She has no rales.  Decreased breath sounds Bilat. Lungs.   Abdominal: Soft. Bowel sounds are normal. There is no tenderness.  Musculoskeletal: Normal range of motion. She exhibits tenderness (chronic lower back and the right hip since the fx. better controloled with scheduled Norco. ). She exhibits no edema.  Lower back-baseline: pain at end of day occasionally.   Lymphadenopathy:    She has no cervical adenopathy.  Neurological: She is alert and oriented to person, place, and time. She has normal reflexes. No cranial nerve deficit. She exhibits normal muscle tone. Coordination normal.  Skin: Skin is warm and dry. No rash noted. She is not diaphoretic. No erythema (LLE).  The right hip surgical incision healed.   Psychiatric: Her mood appears anxious. Her affect is not angry, not blunt, not labile and not inappropriate. Her speech is not delayed, not tangential and not slurred. She is hyperactive and slowed. She is not agitated, not aggressive, not withdrawn and not combative. Thought content is not paranoid and not delusional. Cognition and memory are impaired. She does not express  impulsivity or inappropriate judgment. She exhibits a depressed mood. She exhibits abnormal recent memory.     Filed Vitals:   04/13/14 1316  BP: 128/66  Pulse: 70  Temp:  98.6 F (37 C)  TempSrc: Tympanic  Resp: 18      Labs reviewed: Basic Metabolic Panel:  Recent Labs  05/27/13 1446 07/26/13 1131 08/28/13  NA 140 144 141  K 4.5 4.0 4.2  CL  --  104  --   CO2 28 23  --   GLUCOSE 121 93  --   BUN 22.8 23 17   CREATININE 1.1 0.84 0.8  CALCIUM 9.5 10.0  --   TSH  --   --  1.32   Liver Function Tests:  Recent Labs  05/15/13 05/27/13 1446 08/28/13  AST 16 19 18   ALT 10 11 12   ALKPHOS 47 63 56  BILITOT  --  0.33  --   PROT  --  6.5  --   ALBUMIN  --  3.6  --    No results for input(s): LIPASE, AMYLASE in the last 8760 hours. No results for input(s): AMMONIA in the last 8760 hours. CBC:  Recent Labs  05/27/13 1549  07/26/13 1131  11/18/13 01/15/14 03/17/14  WBC 5.7  < > 7.9  < > 6.2 6.7 7.9  NEUTROABS 3.7  --  7.1  --   --   --   --   HGB 12.6  < > 12.7  < > 11.0* 11.8* 13.9  HCT 37.9  < > 39.4  < > 37 36 41  MCV 110.1*  --  112.6*  --   --   --   --   PLT 332  < > 306  < > 358 276 349  < > = values in this interval not displayed. Lipid Panel: No results for input(s): CHOL, HDL, LDLCALC, TRIG, CHOLHDL, LDLDIRECT in the last 8760 hours.   Past Procedures:   NA  Assessment/Plan Loss of weight 04/13/14 Pharm: taper off Mirtazapine. Has gained weight-#108 Ibs.     Anxiety and depression Stabilized. Sertraline to 50mg  daily 07/16/13. 08/26/13 Mirtazapine 7.5mg  qhs  04/13/14 taper off Mirtazapine.      Essential thrombocythemia PTL in 300s.  Takes Hydroxyurea 1000mg  q Monday, 500mg  Sun, Wed, and Fri     COPD with asthma dc'd Epipen prn and Immunotherapy per [facility] 01/17/13 02/11/14 stable, takes Montelukast 10mg  daily, Breo Ellipta I daily inhaled, Ventolin HFA 2 puffs q6h prn.  Stable.     Dementia Presently taking Namenda 14mg   daily and SNF for care needs     Deep vein thrombosis of left lower extremity Switched to [place] since 09/12/12. Left      Essential hypertension On Metoprolol 25mg  bid. Controlled. Prn NTG available to her-no angina since last visited.      Achalasia of esophagus Stable. Nifedipine 10mg  tid ac meals.      BACK PAIN, LUMBAR Walking with walker as tolerated, continues with w/c prn. Continue Norco 10/325mg  qid. F/u [name].  11/10/13 dc Oxycodone prn-not used in 60 days.  Pain is reasonable controlled.   GERD Stable. Omeprazole 20mg  daily. Prn Promethazine 12.5mg  q6h available to her.        Family/ Staff Communication: observe the patient.   Goals of Care: SNF  Labs/tests ordered: none

## 2014-05-11 ENCOUNTER — Encounter: Payer: Self-pay | Admitting: Nurse Practitioner

## 2014-05-11 ENCOUNTER — Non-Acute Institutional Stay (SKILLED_NURSING_FACILITY): Payer: Medicare Other | Admitting: Nurse Practitioner

## 2014-05-11 DIAGNOSIS — F0390 Unspecified dementia without behavioral disturbance: Secondary | ICD-10-CM | POA: Diagnosis not present

## 2014-05-11 DIAGNOSIS — I82402 Acute embolism and thrombosis of unspecified deep veins of left lower extremity: Secondary | ICD-10-CM

## 2014-05-11 DIAGNOSIS — F32A Depression, unspecified: Secondary | ICD-10-CM

## 2014-05-11 DIAGNOSIS — K219 Gastro-esophageal reflux disease without esophagitis: Secondary | ICD-10-CM

## 2014-05-11 DIAGNOSIS — K220 Achalasia of cardia: Secondary | ICD-10-CM | POA: Diagnosis not present

## 2014-05-11 DIAGNOSIS — I10 Essential (primary) hypertension: Secondary | ICD-10-CM | POA: Diagnosis not present

## 2014-05-11 DIAGNOSIS — I82409 Acute embolism and thrombosis of unspecified deep veins of unspecified lower extremity: Secondary | ICD-10-CM

## 2014-05-11 DIAGNOSIS — F419 Anxiety disorder, unspecified: Principal | ICD-10-CM

## 2014-05-11 DIAGNOSIS — M5440 Lumbago with sciatica, unspecified side: Secondary | ICD-10-CM

## 2014-05-11 DIAGNOSIS — J4489 Other specified chronic obstructive pulmonary disease: Secondary | ICD-10-CM

## 2014-05-11 DIAGNOSIS — D473 Essential (hemorrhagic) thrombocythemia: Secondary | ICD-10-CM | POA: Diagnosis not present

## 2014-05-11 DIAGNOSIS — J449 Chronic obstructive pulmonary disease, unspecified: Secondary | ICD-10-CM

## 2014-05-11 DIAGNOSIS — F418 Other specified anxiety disorders: Secondary | ICD-10-CM

## 2014-05-11 DIAGNOSIS — F329 Major depressive disorder, single episode, unspecified: Secondary | ICD-10-CM

## 2014-05-11 DIAGNOSIS — R634 Abnormal weight loss: Secondary | ICD-10-CM | POA: Diagnosis not present

## 2014-05-11 NOTE — Assessment & Plan Note (Signed)
Walking with walker as tolerated, continues with w/c prn. Continue Norco 10/325mg  qid. F/u [name].  11/10/13 dc Oxycodone prn-not used in 60 days.  Pain is reasonable controlled.

## 2014-05-11 NOTE — Assessment & Plan Note (Signed)
On Metoprolol 25mg  bid. Controlled. Prn NTG available to her-no angina since last visited.

## 2014-05-11 NOTE — Assessment & Plan Note (Signed)
Stabilized. Sertraline to 50mg  daily 07/16/13. 08/26/13 Mirtazapine 7.5mg  qhs  04/13/14 taper off Mirtazapine.  05/11/14 mood is stable.

## 2014-05-11 NOTE — Assessment & Plan Note (Signed)
Switched to [place] since 09/12/12. Left

## 2014-05-11 NOTE — Assessment & Plan Note (Signed)
Stable. Omeprazole 20mg  daily. Prn Promethazine 12.5mg  q6h available to her.

## 2014-05-11 NOTE — Assessment & Plan Note (Signed)
Switched to [place] since 09/12/12. Left

## 2014-05-11 NOTE — Assessment & Plan Note (Signed)
Stable. Nifedipine 10mg  tid ac meals.

## 2014-05-11 NOTE — Progress Notes (Signed)
Patient ID: Sherri Crawford, female   DOB: Mar 09, 1920, 81 y.o.   MRN: [id number]   Code Status: DNR  Allergies  Allergen Reactions  . Sulfonamide Derivatives Other (See Comments)    Pt is unsure of reaction    Chief Complaint  Patient presents with  . Medical Management of Chronic Issues    HPI: Patient is a 81 y.o. female seen in the SNF at [facility] today for evaluation of chronic medical conditions.  Problem List Items Addressed This Visit    Loss of weight - Primary    04/13/14 Pharm: taper off Mirtazapine.        GERD    Stable. Omeprazole 20mg  daily. Prn Promethazine 12.5mg  q6h available to her.         Essential thrombocythemia    PTL in 300s.  Takes Hydroxyurea 1000mg  q Monday, 500mg  Sun, Wed, and Fri       Essential hypertension    On Metoprolol 25mg  bid. Controlled. Prn NTG available to her-no angina since last visited.         DVT (deep venous thrombosis)    Switched to [place] since 09/12/12. Left        Dementia    Presently taking Namenda 14mg  daily and SNF for care needs         Deep vein thrombosis of left lower extremity    Switched to [facility] since 09/12/12. Left         COPD with asthma    dc'd Epipen prn and Immunotherapy per [facility] 01/17/13 --stable, takes Montelukast 10mg  daily, Breo Ellipta I daily inhaled, Ventolin HFA 2 puffs q6h prn.         BACK PAIN, LUMBAR    Walking with walker as tolerated, continues with w/c prn. Continue Norco 10/325mg  qid. F/u [name].  11/10/13 dc Oxycodone prn-not used in 60 days.  Pain is reasonable controlled.       Anxiety and depression    Stabilized. Sertraline to 50mg  daily 07/16/13. 08/26/13 Mirtazapine 7.5mg  qhs  04/13/14 taper off Mirtazapine.  05/11/14 mood is stable.         Achalasia of esophagus    Stable. Nifedipine 10mg  tid ac meals.          Review of Systems:  Review of Systems  Constitutional: Negative for fever, chills and diaphoresis.  HENT:  Positive for hearing loss. Negative for congestion, ear discharge, ear pain, nosebleeds, sore throat and tinnitus.   Eyes: Negative for photophobia, pain, discharge and redness.  Respiratory: Positive for cough. Negative for shortness of breath, wheezing and stridor.   Cardiovascular: Negative for chest pain, palpitations and leg swelling.  Gastrointestinal: Negative for nausea, vomiting, abdominal pain, diarrhea, constipation and blood in stool.  Endocrine: Negative for polydipsia.  Genitourinary: Positive for frequency. Negative for dysuria, urgency, hematuria and flank pain.  Musculoskeletal: Positive for back pain. Negative for myalgias and neck pain.  Skin: Negative for rash.  Allergic/Immunologic: Negative for environmental allergies.  Neurological: Negative for dizziness, tremors, seizures, weakness and headaches.  Hematological: Does not bruise/bleed easily.  Psychiatric/Behavioral: Negative for suicidal ideas and hallucinations. The patient is nervous/anxious.      Past Medical History  Diagnosis Date  . Macular degeneration   . COPD (chronic obstructive pulmonary disease)   . Hypertension   . Coronary artery disease   . Hypercholesterolemia   . GERD (gastroesophageal reflux disease)   . Asthmatic bronchitis   . History of colonic polyps   . DJD (degenerative  joint disease)   . Lumbar back pain   . Osteoporosis   . History of transient ischemic attack (TIA)   . Anxiety   . Essential thrombocythemia   . Diverticulosis 05/10/2012  . Osteoarthrosis, unspecified whether generalized or localized, unspecified site 05/09/2012  . Memory loss 05/09/2012  . Abnormal weight gain 05/09/2012  . Phlebitis and thrombophlebitis of other deep vessels of lower extremities 07/10/2010  . Essential thrombocythemia 06/09/2009  . Depression   . Esophageal stricture    Past Surgical History  Procedure Laterality Date  . Appendectomy    . Cholecystectomy    . [place] and bso  08/21/1995  .  Cataract extraction w/ intraocular lens  implant, bilateral    . Abdominal hysterectomy    . Hip arthroplasty Right 09/09/2012    Procedure: ARTHROPLASTY BIPOLAR HIP;  Surgeon: Mauri Pole, MD;  Location: [facility] ORS;  Service: Orthopedics;  Laterality: Right;  . Esophagogastroduodenoscopy (egd) with esophageal dilation N/A 11/25/2012    Procedure: ESOPHAGOGASTRODUODENOSCOPY (EGD) WITH ESOPHAGEAL DILATION;  Surgeon: Jerene Bears, MD;  Location: [facility] ENDOSCOPY;  Service: Gastroenterology;  Laterality: N/A;  . Esophagogastroduodenoscopy N/A 07/26/2013    Procedure: ESOPHAGOGASTRODUODENOSCOPY (EGD);  Surgeon: Irene Shipper, MD;  Location: [facility] ENDOSCOPY;  Service: Endoscopy;  Laterality: N/A;  . Esophageal manometry N/A 08/25/2013    Procedure: ESOPHAGEAL MANOMETRY (EM);  Surgeon: Jerene Bears, MD;  Location: [facility] ENDOSCOPY;  Service: Gastroenterology;  Laterality: N/A;  . Esophagogastroduodenoscopy (egd) with propofol N/A 09/18/2013    Procedure: ESOPHAGOGASTRODUODENOSCOPY (EGD) WITH PROPOFOL;  Surgeon: Lafayette Dragon, MD;  Location: [facility] ENDOSCOPY;  Service: Endoscopy;  Laterality: N/A;  . Botox injection N/A 09/18/2013    Procedure: BOTOX INJECTION;  Surgeon: Lafayette Dragon, MD;  Location: [facility] ENDOSCOPY;  Service: Endoscopy;  Laterality: N/A;   Social History:   reports that she has never smoked. She has never used smokeless tobacco. She reports that she does not drink alcohol or use illicit drugs.  Family History  Problem Relation Age of Onset  . Colon cancer Son 30  . Crohn's disease Son   . Cancer Maternal Aunt     Medications: Patient's Medications  New Prescriptions   No medications on file  Previous Medications   ALBUTEROL (PROVENTIL HFA;VENTOLIN HFA) 108 (90 BASE) MCG/ACT INHALER    Inhale 2 puffs into the lungs every 6 (six) hours as needed for wheezing or shortness of breath.   ATORVASTATIN (LIPITOR) 20 MG TABLET    Take 20 mg by mouth every evening.    CHOLECALCIFEROL (VITAMIN D) 1000 UNITS  TABLET    Take 1,000 Units by mouth every morning.   FEEDING SUPPLEMENT (RESOURCE BREEZE) LIQD    Take 90 mLs by mouth 4 (four) times daily.    FLUTICASONE-SALMETEROL (ADVAIR) 250-50 MCG/DOSE AEPB    Inhale 1 puff into the lungs every 12 (twelve) hours.   HYDROCODONE-ACETAMINOPHEN (NORCO) 10-325 MG PER TABLET    Take 1 tablet by mouth 4 (four) times daily.   HYDROXYUREA (HYDREA) 500 MG CAPSULE    Take 500-1,000 mg by mouth See admin instructions. Take 1000 mg on Monday and 500 mg on Sunday, Wednesday and Friday   LORAZEPAM (ATIVAN) 0.5 MG TABLET    Take 0.5 mg by mouth every 4 (four) hours as needed for anxiety (and nausea).    MEMANTINE HCL ER (NAMENDA XR) 14 MG CP24    Take 14 mg by mouth daily.   METOPROLOL TARTRATE (LOPRESSOR) 25 MG TABLET    Take  25 mg by mouth 2 (two) times daily.   MONTELUKAST (SINGULAIR) 10 MG TABLET    Take 10 mg by mouth at bedtime.   MULTIPLE VITAMINS-MINERALS (CERTAVITE SENIOR/ANTIOXIDANT PO)    Take 1 tablet by mouth every morning.   NIFEDIPINE (PROCARDIA) 10 MG CAPSULE    Take 1 capsule (10 mg total) by mouth 3 (three) times daily before meals.   NITROGLYCERIN (NITROSTAT) 0.4 MG SL TABLET    Place 0.4 mg under the tongue every 5 (five) minutes as needed for chest pain.   OMEPRAZOLE (PRILOSEC) 20 MG CAPSULE    Take 20 mg by mouth every morning.    OXYCODONE (OXY IR/ROXICODONE) 5 MG IMMEDIATE RELEASE TABLET    Take 5 mg by mouth every 4 (four) hours as needed for severe pain.   PROMETHAZINE (PHENERGAN) 12.5 MG SUPPOSITORY    Place 12.5 mg rectally every 6 (six) hours as needed for nausea or vomiting.   PROMETHAZINE (PHENERGAN) 12.5 MG TABLET    Take 12.5 mg by mouth every 6 (six) hours as needed for nausea or vomiting.   RIVAROXABAN (XARELTO) 15 MG TABS TABLET    Take 15 mg by mouth daily.   SERTRALINE (ZOLOFT) 50 MG TABLET    Take 50 mg by mouth every evening.   Modified Medications   No medications on file  Discontinued Medications   No medications on file      Physical Exam: Physical Exam  Constitutional: She is oriented to person, place, and time. She appears well-developed and well-nourished. No distress.  HENT:  Head: Normocephalic and atraumatic.  Eyes: Conjunctivae and EOM are normal. Pupils are equal, round, and reactive to light.  Neck: Normal range of motion. Neck supple. No JVD present. No thyromegaly present.  Cardiovascular: Normal rate, regular rhythm and normal heart sounds.   Pulmonary/Chest: Effort normal. She has decreased breath sounds. She has no wheezes. She has no rales.  Decreased breath sounds Bilat. Lungs.   Abdominal: Soft. Bowel sounds are normal. There is no tenderness.  Musculoskeletal: Normal range of motion. She exhibits tenderness (chronic lower back and the right hip since the fx. better controloled with scheduled Norco. ). She exhibits no edema.  Lower back-baseline: pain at end of day occasionally.   Lymphadenopathy:    She has no cervical adenopathy.  Neurological: She is alert and oriented to person, place, and time. She has normal reflexes. No cranial nerve deficit. She exhibits normal muscle tone. Coordination normal.  Skin: Skin is warm and dry. No rash noted. She is not diaphoretic. No erythema (LLE).  The right hip surgical incision healed.   Psychiatric: Her mood appears anxious. Her affect is not angry, not blunt, not labile and not inappropriate. Her speech is not delayed, not tangential and not slurred. She is hyperactive and slowed. She is not agitated, not aggressive, not withdrawn and not combative. Thought content is not paranoid and not delusional. Cognition and memory are impaired. She does not express impulsivity or inappropriate judgment. She exhibits a depressed mood. She exhibits abnormal recent memory.    Filed Vitals:   05/11/14 1603  BP: 146/76  Pulse: 68  Temp: 98 F (36.7 C)  TempSrc: Tympanic  Resp: 18      Labs reviewed: Basic Metabolic Panel:  Recent Labs   05/27/13 1446 07/26/13 1131 08/28/13  NA 140 144 141  K 4.5 4.0 4.2  CL  --  104  --   CO2 28 23  --   GLUCOSE 121 93  --  BUN 22.8 23 17   CREATININE 1.1 0.84 0.8  CALCIUM 9.5 10.0  --   TSH  --   --  1.32   Liver Function Tests:  Recent Labs  05/15/13 05/27/13 1446 08/28/13  AST 16 19 18   ALT 10 11 12   ALKPHOS 47 63 56  BILITOT  --  0.33  --   PROT  --  6.5  --   ALBUMIN  --  3.6  --    No results for input(s): LIPASE, AMYLASE in the last 8760 hours. No results for input(s): AMMONIA in the last 8760 hours. CBC:  Recent Labs  05/27/13 1549  07/26/13 1131  11/18/13 01/15/14 03/17/14  WBC 5.7  < > 7.9  < > 6.2 6.7 7.9  NEUTROABS 3.7  --  7.1  --   --   --   --   HGB 12.6  < > 12.7  < > 11.0* 11.8* 13.9  HCT 37.9  < > 39.4  < > 37 36 41  MCV 110.1*  --  112.6*  --   --   --   --   PLT 332  < > 306  < > 358 276 349  < > = values in this interval not displayed. Lipid Panel: No results for input(s): CHOL, HDL, LDLCALC, TRIG, CHOLHDL, LDLDIRECT in the last 8760 hours.  Past Procedures:  09/09/12  CT head:  IMPRESSION: No skull fracture or intracranial hemorrhage.  Small vessel disease type changes without CT evidence of large acute infarct.  X-ray right hip  IMPRESSION: Right femoral prosthesis without acute complication  X-ray Lumbar spine  IMPRESSION: Diffuse lumbar spondylosis. No evidence of fracture.  X--ray R hip  IMPRESSION: Displaced right subcapital hip fracture.  11/25/12 GED: Esophageal dysmotility w/o definitive stricture. S/p empiric dilation in the lower third of the esophagus and across GE junction with TTS balloon to 2mm. Recommended diet as tolerated.   06/25/13 X-ray lumbar spine: disc disease, slight rotoscoliosis curvature to the left, the is a right hip prosthesis present, there is an eggshell calcification in the right upper quadrant of uncertain etiology.   Assessment/Plan Loss of weight 04/13/14 Pharm: taper off  Mirtazapine.     GERD Stable. Omeprazole 20mg  daily. Prn Promethazine 12.5mg  q6h available to her.      Essential thrombocythemia PTL in 300s.  Takes Hydroxyurea 1000mg  q Monday, 500mg  Sun, Wed, and Fri    Essential hypertension On Metoprolol 25mg  bid. Controlled. Prn NTG available to her-no angina since last visited.      DVT (deep venous thrombosis) Switched to [place] since 09/12/12. Left     Dementia Presently taking Namenda 14mg  daily and SNF for care needs      Deep vein thrombosis of left lower extremity Switched to [facility] since 09/12/12. Left      COPD with asthma dc'd Epipen prn and Immunotherapy per [facility] 01/17/13 --stable, takes Montelukast 10mg  daily, Breo Ellipta I daily inhaled, Ventolin HFA 2 puffs q6h prn.      BACK PAIN, LUMBAR Walking with walker as tolerated, continues with w/c prn. Continue Norco 10/325mg  qid. F/u [name].  11/10/13 dc Oxycodone prn-not used in 60 days.  Pain is reasonable controlled.    Anxiety and depression Stabilized. Sertraline to 50mg  daily 07/16/13. 08/26/13 Mirtazapine 7.5mg  qhs  04/13/14 taper off Mirtazapine.  05/11/14 mood is stable.      Achalasia of esophagus Stable. Nifedipine 10mg  tid ac meals.      Family/ Staff Communication: observe the  patient  Goals of Care: SNF  Labs/tests ordered: none

## 2014-05-11 NOTE — Assessment & Plan Note (Signed)
dc'd Epipen prn and Immunotherapy per [facility] 01/17/13 --stable, takes Montelukast 10mg  daily, Breo Ellipta I daily inhaled, Ventolin HFA 2 puffs q6h prn.

## 2014-05-11 NOTE — Assessment & Plan Note (Signed)
04/13/14 Pharm: taper off Mirtazapine.

## 2014-05-11 NOTE — Assessment & Plan Note (Signed)
Presently taking Namenda 14mg  daily and SNF for care needs

## 2014-05-11 NOTE — Assessment & Plan Note (Signed)
PTL in 300s.  Takes Hydroxyurea 1000mg  q Monday, 500mg  Sun, Wed, and Fri

## 2014-06-08 ENCOUNTER — Non-Acute Institutional Stay (SKILLED_NURSING_FACILITY): Payer: Medicare Other | Admitting: Nurse Practitioner

## 2014-06-08 ENCOUNTER — Encounter: Payer: Self-pay | Admitting: Nurse Practitioner

## 2014-06-08 DIAGNOSIS — I10 Essential (primary) hypertension: Secondary | ICD-10-CM

## 2014-06-08 DIAGNOSIS — D473 Essential (hemorrhagic) thrombocythemia: Secondary | ICD-10-CM | POA: Diagnosis not present

## 2014-06-08 DIAGNOSIS — I82402 Acute embolism and thrombosis of unspecified deep veins of left lower extremity: Secondary | ICD-10-CM | POA: Diagnosis not present

## 2014-06-08 DIAGNOSIS — F419 Anxiety disorder, unspecified: Secondary | ICD-10-CM

## 2014-06-08 DIAGNOSIS — F329 Major depressive disorder, single episode, unspecified: Secondary | ICD-10-CM

## 2014-06-08 DIAGNOSIS — M5440 Lumbago with sciatica, unspecified side: Secondary | ICD-10-CM | POA: Diagnosis not present

## 2014-06-08 DIAGNOSIS — F0390 Unspecified dementia without behavioral disturbance: Secondary | ICD-10-CM

## 2014-06-08 DIAGNOSIS — J449 Chronic obstructive pulmonary disease, unspecified: Secondary | ICD-10-CM

## 2014-06-08 DIAGNOSIS — K220 Achalasia of cardia: Secondary | ICD-10-CM

## 2014-06-08 DIAGNOSIS — K219 Gastro-esophageal reflux disease without esophagitis: Secondary | ICD-10-CM

## 2014-06-08 DIAGNOSIS — F32A Depression, unspecified: Secondary | ICD-10-CM

## 2014-06-08 DIAGNOSIS — F418 Other specified anxiety disorders: Secondary | ICD-10-CM | POA: Diagnosis not present

## 2014-06-08 NOTE — Assessment & Plan Note (Signed)
Stable. Nifedipine 10mg  tid ac meals.

## 2014-06-08 NOTE — Assessment & Plan Note (Signed)
PTL in 300s.  Takes Hydroxyurea 1000mg  q Monday, 500mg  Sun, Wed, and Fri

## 2014-06-08 NOTE — Assessment & Plan Note (Signed)
Walking with walker as tolerated, continues with w/c prn. Continue Norco 10/325mg  qid. F/u [name].  11/10/13 dc Oxycodone prn-not used in 60 days.  Pain is reasonable controlled.

## 2014-06-08 NOTE — Assessment & Plan Note (Signed)
dc'd Epipen prn and Immunotherapy per [facility] 01/17/13 --stable, takes Montelukast 10mg  daily, Breo Ellipta I daily inhaled, Ventolin HFA 2 puffs q6h prn.

## 2014-06-08 NOTE — Assessment & Plan Note (Signed)
Presently taking Namenda 14mg  daily and SNF for care needs

## 2014-06-08 NOTE — Assessment & Plan Note (Signed)
Switched to [place] since 09/12/12. Left

## 2014-06-08 NOTE — Assessment & Plan Note (Signed)
Stabilized. Sertraline to 50mg  daily 07/16/13. 08/26/13 Mirtazapine 7.5mg  qhs  04/13/14 taper off Mirtazapine.  --mood is stable.

## 2014-06-08 NOTE — Assessment & Plan Note (Signed)
On Metoprolol 25mg  bid. Controlled. Prn NTG available to her-no angina since last visited.

## 2014-06-08 NOTE — Progress Notes (Signed)
Patient ID: Sherri Crawford, female   DOB: 1920/04/30, 81 y.o.   MRN: [id number]   Code Status: DNR  Allergies  Allergen Reactions  . Sulfonamide Derivatives Other (See Comments)    Pt is unsure of reaction    Chief Complaint  Patient presents with  . Medical Management of Chronic Issues    HPI: Patient is a 81 y.o. female seen in the SNF at [facility] today for evaluation of chronic medical conditions.  Problem List Items Addressed This Visit    Achalasia of esophagus - Primary    Stable. Nifedipine 10mg  tid ac meals.        Anxiety and depression    Stabilized. Sertraline to 50mg  daily 07/16/13. 08/26/13 Mirtazapine 7.5mg  qhs  04/13/14 taper off Mirtazapine.  --mood is stable.         BACK PAIN, LUMBAR    Walking with walker as tolerated, continues with w/c prn. Continue Norco 10/325mg  qid. F/u Sherri Crawford.  11/10/13 dc Oxycodone prn-not used in 60 days.  Pain is reasonable controlled.        COPD with asthma    dc'd Epipen prn and Immunotherapy per [facility] 01/17/13 --stable, takes Montelukast 10mg  daily, Breo Ellipta I daily inhaled, Ventolin HFA 2 puffs q6h prn.        Deep vein thrombosis of left lower extremity    Switched to Xeralto since 09/12/12. Left       Dementia    Presently taking Namenda 14mg  daily and SNF for care needs        Essential hypertension, benign    On Metoprolol 25mg  bid. Controlled. Prn NTG available to her-no angina since last visited.        Essential thrombocythemia    PTL in 300s.  Takes Hydroxyurea 1000mg  q Monday, 500mg  Sun, Wed, and Fri       GERD    Stable. Omeprazole 20mg  daily. Prn Promethazine 12.5mg  q6h available to her.           Review of Systems:  Review of Systems  Constitutional: Negative for fever, chills and diaphoresis.  HENT: Positive for hearing loss. Negative for congestion, ear discharge, ear pain, nosebleeds, sore throat and tinnitus.   Eyes: Negative for photophobia, pain,  discharge and redness.  Respiratory: Positive for cough. Negative for shortness of breath, wheezing and stridor.   Cardiovascular: Negative for chest pain, palpitations and leg swelling.  Gastrointestinal: Negative for nausea, vomiting, abdominal pain, diarrhea, constipation and blood in stool.  Endocrine: Negative for polydipsia.  Genitourinary: Positive for frequency. Negative for dysuria, urgency, hematuria and flank pain.  Musculoskeletal: Positive for back pain. Negative for myalgias and neck pain.  Skin: Negative for rash.  Allergic/Immunologic: Negative for environmental allergies.  Neurological: Negative for dizziness, tremors, seizures, weakness and headaches.  Hematological: Does not bruise/bleed easily.  Psychiatric/Behavioral: Negative for suicidal ideas and hallucinations. The patient is nervous/anxious.      Past Medical History  Diagnosis Date  . Macular degeneration   . COPD (chronic obstructive pulmonary disease)   . Hypertension   . Coronary artery disease   . Hypercholesterolemia   . GERD (gastroesophageal reflux disease)   . Asthmatic bronchitis   . History of colonic polyps   . DJD (degenerative joint disease)   . Lumbar back pain   . Osteoporosis   . History of transient ischemic attack (TIA)   . Anxiety   . Essential thrombocythemia   . Diverticulosis 05/10/2012  . Osteoarthrosis, unspecified whether generalized or  localized, unspecified site 05/09/2012  . Memory loss 05/09/2012  . Abnormal weight gain 05/09/2012  . Phlebitis and thrombophlebitis of other deep vessels of lower extremities 07/10/2010  . Essential thrombocythemia 06/09/2009  . Depression   . Esophageal stricture    Past Surgical History  Procedure Laterality Date  . Appendectomy    . Cholecystectomy    . Tah and bso  08/21/1995  . Cataract extraction w/ intraocular lens  implant, bilateral    . Abdominal hysterectomy    . Hip arthroplasty Right 09/09/2012    Procedure: ARTHROPLASTY BIPOLAR  HIP;  Surgeon: Mauri Pole, MD;  Location: [facility];  Service: Orthopedics;  Laterality: Right;  . Esophagogastroduodenoscopy (egd) with esophageal dilation N/A 11/25/2012    Procedure: ESOPHAGOGASTRODUODENOSCOPY (EGD) WITH ESOPHAGEAL DILATION;  Surgeon: Jerene Bears, MD;  Location: [facility] ENDOSCOPY;  Service: Gastroenterology;  Laterality: N/A;  . Esophagogastroduodenoscopy N/A 07/26/2013    Procedure: ESOPHAGOGASTRODUODENOSCOPY (EGD);  Surgeon: Irene Shipper, MD;  Location: [facility] ENDOSCOPY;  Service: Endoscopy;  Laterality: N/A;  . Esophageal manometry N/A 08/25/2013    Procedure: ESOPHAGEAL MANOMETRY (EM);  Surgeon: Jerene Bears, MD;  Location: [facility] ENDOSCOPY;  Service: Gastroenterology;  Laterality: N/A;  . Esophagogastroduodenoscopy (egd) with propofol N/A 09/18/2013    Procedure: ESOPHAGOGASTRODUODENOSCOPY (EGD) WITH PROPOFOL;  Surgeon: Lafayette Dragon, MD;  Location: [facility] ENDOSCOPY;  Service: Endoscopy;  Laterality: N/A;  . Botox injection N/A 09/18/2013    Procedure: BOTOX INJECTION;  Surgeon: Lafayette Dragon, MD;  Location: [facility] ENDOSCOPY;  Service: Endoscopy;  Laterality: N/A;   Social History:   reports that she has never smoked. She has never used smokeless tobacco. She reports that she does not drink alcohol or use illicit drugs.  Family History  Problem Relation Age of Onset  . Colon cancer Son 31  . Crohn's disease Son   . Cancer Maternal Aunt     Medications: Patient's Medications  New Prescriptions   No medications on file  Previous Medications   ALBUTEROL (PROVENTIL HFA;VENTOLIN HFA) 108 (90 BASE) MCG/ACT INHALER    Inhale 2 puffs into the lungs every 6 (six) hours as needed for wheezing or shortness of breath.   ATORVASTATIN (LIPITOR) 20 MG TABLET    Take 20 mg by mouth every evening.    CHOLECALCIFEROL (VITAMIN D) 1000 UNITS TABLET    Take 1,000 Units by mouth every morning.   FEEDING SUPPLEMENT (RESOURCE BREEZE) LIQD    Take 90 mLs by mouth 4 (four) times daily.     FLUTICASONE-SALMETEROL (ADVAIR) 250-50 MCG/DOSE AEPB    Inhale 1 puff into the lungs every 12 (twelve) hours.   HYDROCODONE-ACETAMINOPHEN (NORCO) 10-325 MG PER TABLET    Take 1 tablet by mouth 4 (four) times daily.   HYDROXYUREA (HYDREA) 500 MG CAPSULE    Take 500-1,000 mg by mouth See admin instructions. Take 1000 mg on Monday and 500 mg on Sunday, Wednesday and Friday   LORAZEPAM (ATIVAN) 0.5 MG TABLET    Take 0.5 mg by mouth every 4 (four) hours as needed for anxiety (and nausea).    MEMANTINE HCL ER (NAMENDA XR) 14 MG CP24    Take 14 mg by mouth daily.   METOPROLOL TARTRATE (LOPRESSOR) 25 MG TABLET    Take 25 mg by mouth 2 (two) times daily.   MONTELUKAST (SINGULAIR) 10 MG TABLET    Take 10 mg by mouth at bedtime.   MULTIPLE VITAMINS-MINERALS (CERTAVITE SENIOR/ANTIOXIDANT PO)    Take 1 tablet by mouth every morning.  NIFEDIPINE (PROCARDIA) 10 MG CAPSULE    Take 1 capsule (10 mg total) by mouth 3 (three) times daily before meals.   NITROGLYCERIN (NITROSTAT) 0.4 MG SL TABLET    Place 0.4 mg under the tongue every 5 (five) minutes as needed for chest pain.   OMEPRAZOLE (PRILOSEC) 20 MG CAPSULE    Take 20 mg by mouth every morning.    OXYCODONE (OXY IR/ROXICODONE) 5 MG IMMEDIATE RELEASE TABLET    Take 5 mg by mouth every 4 (four) hours as needed for severe pain.   PROMETHAZINE (PHENERGAN) 12.5 MG SUPPOSITORY    Place 12.5 mg rectally every 6 (six) hours as needed for nausea or vomiting.   PROMETHAZINE (PHENERGAN) 12.5 MG TABLET    Take 12.5 mg by mouth every 6 (six) hours as needed for nausea or vomiting.   RIVAROXABAN (XARELTO) 15 MG TABS TABLET    Take 15 mg by mouth daily.   SERTRALINE (ZOLOFT) 50 MG TABLET    Take 50 mg by mouth every evening.   Modified Medications   No medications on file  Discontinued Medications   No medications on file     Physical Exam: Physical Exam  Constitutional: She is oriented to person, place, and time. She appears well-developed and well-nourished. No  distress.  HENT:  Head: Normocephalic and atraumatic.  Eyes: Conjunctivae and EOM are normal. Pupils are equal, round, and reactive to light.  Neck: Normal range of motion. Neck supple. No JVD present. No thyromegaly present.  Cardiovascular: Normal rate, regular rhythm and normal heart sounds.   Pulmonary/Chest: Effort normal. She has decreased breath sounds. She has no wheezes. She has no rales.  Decreased breath sounds Bilat. Lungs.   Abdominal: Soft. Bowel sounds are normal. There is no tenderness.  Musculoskeletal: Normal range of motion. She exhibits tenderness (chronic lower back and the right hip since the fx. better controloled with scheduled Norco. ). She exhibits no edema.  Lower back-baseline: pain at end of day occasionally.   Lymphadenopathy:    She has no cervical adenopathy.  Neurological: She is alert and oriented to person, place, and time. She has normal reflexes. No cranial nerve deficit. She exhibits normal muscle tone. Coordination normal.  Skin: Skin is warm and dry. No rash noted. She is not diaphoretic. No erythema (LLE).  The right hip surgical incision healed.   Psychiatric: Her mood appears anxious. Her affect is not angry, not blunt, not labile and not inappropriate. Her speech is not delayed, not tangential and not slurred. She is hyperactive and slowed. She is not agitated, not aggressive, not withdrawn and not combative. Thought content is not paranoid and not delusional. Cognition and memory are impaired. She does not express impulsivity or inappropriate judgment. She exhibits a depressed mood. She exhibits abnormal recent memory.    Filed Vitals:   06/08/14 1221  BP: 128/70  Pulse: 62  Temp: 98.2 F (36.8 C)  TempSrc: Tympanic  Resp: 20      Labs reviewed: Basic Metabolic Panel:  Recent Labs  07/26/13 1131 08/28/13  NA 144 141  K 4.0 4.2  CL 104  --   CO2 23  --   GLUCOSE 93  --   BUN 23 17  CREATININE 0.84 0.8  CALCIUM 10.0  --   TSH  --   1.32   Liver Function Tests:  Recent Labs  08/28/13  AST 18  ALT 12  ALKPHOS 56   No results for input(s): LIPASE, AMYLASE in the last  8760 hours. No results for input(s): AMMONIA in the last 8760 hours. CBC:  Recent Labs  07/26/13 1131  11/18/13 01/15/14 03/17/14  WBC 7.9  < > 6.2 6.7 7.9  NEUTROABS 7.1  --   --   --   --   HGB 12.7  < > 11.0* 11.8* 13.9  HCT 39.4  < > 37 36 41  MCV 112.6*  --   --   --   --   PLT 306  < > 358 276 349  < > = values in this interval not displayed. Lipid Panel: No results for input(s): CHOL, HDL, LDLCALC, TRIG, CHOLHDL, LDLDIRECT in the last 8760 hours.  Past Procedures:  09/09/12  CT head:  IMPRESSION: No skull fracture or intracranial hemorrhage.  Small vessel disease type changes without CT evidence of large acute infarct.  X-ray right hip  IMPRESSION: Right femoral prosthesis without acute complication  X-ray Lumbar spine  IMPRESSION: Diffuse lumbar spondylosis. No evidence of fracture.  X--ray R hip  IMPRESSION: Displaced right subcapital hip fracture.  11/25/12 GED: Esophageal dysmotility w/o definitive stricture. S/p empiric dilation in the lower third of the esophagus and across GE junction with TTS balloon to 1mm. Recommended diet as tolerated.   06/25/13 X-ray lumbar spine: disc disease, slight rotoscoliosis curvature to the left, the is a right hip prosthesis present, there is an eggshell calcification in the right upper quadrant of uncertain etiology.   Assessment/Plan Achalasia of esophagus Stable. Nifedipine 10mg  tid ac meals.     Anxiety and depression Stabilized. Sertraline to 50mg  daily 07/16/13. 08/26/13 Mirtazapine 7.5mg  qhs  04/13/14 taper off Mirtazapine.  --mood is stable.      BACK PAIN, LUMBAR Walking with walker as tolerated, continues with w/c prn. Continue Norco 10/325mg  qid. F/u Sherri Crawford.  11/10/13 dc Oxycodone prn-not used in 60 days.  Pain is reasonable controlled.     COPD with  asthma dc'd Epipen prn and Immunotherapy per [facility] 01/17/13 --stable, takes Montelukast 10mg  daily, Breo Ellipta I daily inhaled, Ventolin HFA 2 puffs q6h prn.     Deep vein thrombosis of left lower extremity Switched to Xeralto since 09/12/12. Left    Dementia Presently taking Namenda 14mg  daily and SNF for care needs     Essential hypertension, benign On Metoprolol 25mg  bid. Controlled. Prn NTG available to her-no angina since last visited.     Essential thrombocythemia PTL in 300s.  Takes Hydroxyurea 1000mg  q Monday, 500mg  Sun, Wed, and Fri    GERD Stable. Omeprazole 20mg  daily. Prn Promethazine 12.5mg  q6h available to her.       Family/ Staff Communication: observe the patient  Goals of Care: SNF  Labs/tests ordered: none

## 2014-06-08 NOTE — Assessment & Plan Note (Signed)
Stable. Omeprazole 20mg  daily. Prn Promethazine 12.5mg  q6h available to her.

## 2014-06-15 ENCOUNTER — Other Ambulatory Visit: Payer: Self-pay | Admitting: Nurse Practitioner

## 2014-07-14 LAB — CBC AND DIFFERENTIAL
HEMATOCRIT: 37 % (ref 36–46)
HEMOGLOBIN: 12.9 g/dL (ref 12.0–16.0)
Platelets: 276 10*3/uL (ref 150–399)
WBC: 8.2 10*3/mL

## 2014-07-15 ENCOUNTER — Other Ambulatory Visit: Payer: Self-pay | Admitting: Nurse Practitioner

## 2014-07-15 DIAGNOSIS — D473 Essential (hemorrhagic) thrombocythemia: Secondary | ICD-10-CM

## 2014-07-16 ENCOUNTER — Encounter: Payer: Self-pay | Admitting: Internal Medicine

## 2014-07-23 ENCOUNTER — Non-Acute Institutional Stay (SKILLED_NURSING_FACILITY): Payer: Medicare Other | Admitting: Nurse Practitioner

## 2014-07-23 ENCOUNTER — Encounter: Payer: Self-pay | Admitting: Nurse Practitioner

## 2014-07-23 DIAGNOSIS — I10 Essential (primary) hypertension: Secondary | ICD-10-CM | POA: Diagnosis not present

## 2014-07-23 DIAGNOSIS — D473 Essential (hemorrhagic) thrombocythemia: Secondary | ICD-10-CM | POA: Diagnosis not present

## 2014-07-23 DIAGNOSIS — F418 Other specified anxiety disorders: Secondary | ICD-10-CM | POA: Diagnosis not present

## 2014-07-23 DIAGNOSIS — F0390 Unspecified dementia without behavioral disturbance: Secondary | ICD-10-CM

## 2014-07-23 DIAGNOSIS — M810 Age-related osteoporosis without current pathological fracture: Secondary | ICD-10-CM

## 2014-07-23 DIAGNOSIS — K220 Achalasia of cardia: Secondary | ICD-10-CM | POA: Diagnosis not present

## 2014-07-23 DIAGNOSIS — I82402 Acute embolism and thrombosis of unspecified deep veins of left lower extremity: Secondary | ICD-10-CM

## 2014-07-23 DIAGNOSIS — F419 Anxiety disorder, unspecified: Secondary | ICD-10-CM

## 2014-07-23 DIAGNOSIS — F329 Major depressive disorder, single episode, unspecified: Secondary | ICD-10-CM

## 2014-07-23 DIAGNOSIS — F32A Depression, unspecified: Secondary | ICD-10-CM

## 2014-07-23 DIAGNOSIS — K219 Gastro-esophageal reflux disease without esophagitis: Secondary | ICD-10-CM

## 2014-07-23 DIAGNOSIS — J449 Chronic obstructive pulmonary disease, unspecified: Secondary | ICD-10-CM | POA: Diagnosis not present

## 2014-07-23 DIAGNOSIS — M5440 Lumbago with sciatica, unspecified side: Secondary | ICD-10-CM | POA: Diagnosis not present

## 2014-07-23 NOTE — Assessment & Plan Note (Signed)
06/15/14 dc Vit D 1000u po daily per pharm.

## 2014-07-23 NOTE — Assessment & Plan Note (Signed)
07/14/14 Plt 276

## 2014-07-23 NOTE — Assessment & Plan Note (Signed)
dc'd Epipen prn and Immunotherapy per [facility] 01/17/13 --stable, takes Montelukast 10mg  daily, Breo Ellipta I daily inhaled, Ventolin HFA 2 puffs q6h prn.

## 2014-07-23 NOTE — Assessment & Plan Note (Signed)
takes Metoprolol 25mg  bid. Controlled. Prn NTG available to her-no angina since last visited.

## 2014-07-23 NOTE — Assessment & Plan Note (Signed)
Walking with walker as tolerated, continues with w/c prn. Continue Norco 10/325mg  qid. F/u Sherri Crawford.  11/10/13 dc Oxycodone prn-not used in 60 days.  Pain is reasonable controlled.

## 2014-07-23 NOTE — Assessment & Plan Note (Signed)
Stable. Omeprazole 20mg  daily. Prn Promethazine 12.5mg  q6h available to her.

## 2014-07-23 NOTE — Assessment & Plan Note (Signed)
Stable. Nifedipine 10mg  tid ac meals.

## 2014-07-23 NOTE — Assessment & Plan Note (Signed)
Presently taking Namenda 14mg  daily and SNF for care needs

## 2014-07-23 NOTE — Assessment & Plan Note (Signed)
Switched to [place] since 09/12/12. Left

## 2014-07-23 NOTE — Progress Notes (Signed)
Patient ID: Sherri Crawford, female   DOB: [birth year], 81 y.o.   MRN: [id number]   Code Status: DNR  Allergies  Allergen Reactions  . Sulfonamide Derivatives Other (See Comments)    Pt is unsure of reaction    Chief Complaint  Patient presents with  . Medical Management of Chronic Issues    HPI: Patient is a 81 y.o. female seen in the SNF at [facility] today for evaluation of chronic medical conditions.  Problem List Items Addressed This Visit    Essential thrombocythemia - Primary    07/14/14 Plt 276       COPD with asthma    dc'd Epipen prn and Immunotherapy per [facility] 01/17/13 --stable, takes Montelukast 10mg  daily, Breo Ellipta I daily inhaled, Ventolin HFA 2 puffs q6h prn.       GERD    Stable. Omeprazole 20mg  daily. Prn Promethazine 12.5mg  q6h available to her.         BACK PAIN, LUMBAR    Walking with walker as tolerated, continues with w/c prn. Continue Norco 10/325mg  qid. F/u [name].  11/10/13 dc Oxycodone prn-not used in 60 days.  Pain is reasonable controlled.        Osteoporosis    06/15/14 dc Vit D 1000u po daily per pharm.        DVT (deep venous thrombosis)    Switched to [place] since 09/12/12. Left        Anxiety and depression    Stabilized. Sertraline to 50mg  daily 07/16/13. 08/26/13 Mirtazapine 7.5mg  qhs  04/13/14 taper off Mirtazapine.  --mood is stable.        Deep vein thrombosis of left lower extremity    Switched to Xeralto since 09/12/12. Left        Dementia    Presently taking Namenda 14mg  daily and SNF for care needs      Achalasia of esophagus    Stable. Nifedipine 10mg  tid ac meals.        Essential hypertension, benign    takes Metoprolol 25mg  bid. Controlled. Prn NTG available to her-no angina since last visited.            Review of Systems:  Review of Systems  Constitutional: Negative for fever, chills and diaphoresis.  HENT: Positive for hearing loss. Negative for congestion, ear  discharge, ear pain, nosebleeds, sore throat and tinnitus.   Eyes: Negative for photophobia, pain, discharge and redness.  Respiratory: Positive for cough. Negative for shortness of breath, wheezing and stridor.   Cardiovascular: Negative for chest pain, palpitations and leg swelling.  Gastrointestinal: Negative for nausea, vomiting, abdominal pain, diarrhea, constipation and blood in stool.  Endocrine: Negative for polydipsia.  Genitourinary: Positive for frequency. Negative for dysuria, urgency, hematuria and flank pain.  Musculoskeletal: Positive for back pain. Negative for myalgias and neck pain.  Skin: Negative for rash.  Allergic/Immunologic: Negative for environmental allergies.  Neurological: Negative for dizziness, tremors, seizures, weakness and headaches.  Hematological: Does not bruise/bleed easily.  Psychiatric/Behavioral: Negative for suicidal ideas and hallucinations. The patient is nervous/anxious.      Past Medical History  Diagnosis Date  . Macular degeneration   . COPD (chronic obstructive pulmonary disease)   . Hypertension   . Coronary artery disease   . Hypercholesterolemia   . GERD (gastroesophageal reflux disease)   . Asthmatic bronchitis   . History of colonic polyps   . DJD (degenerative joint disease)   . Lumbar back pain   . Osteoporosis   .  History of transient ischemic attack (TIA)   . Anxiety   . Essential thrombocythemia   . Diverticulosis 05/10/2012  . Osteoarthrosis, unspecified whether generalized or localized, unspecified site 05/09/2012  . Memory loss 05/09/2012  . Abnormal weight gain 05/09/2012  . Phlebitis and thrombophlebitis of other deep vessels of lower extremities 07/10/2010  . Essential thrombocythemia 06/09/2009  . Depression   . Esophageal stricture    Past Surgical History  Procedure Laterality Date  . Appendectomy    . Cholecystectomy    . [place] and bso  08/21/1995  . Cataract extraction w/ intraocular lens  implant, bilateral      . Abdominal hysterectomy    . Hip arthroplasty Right 09/09/2012    Procedure: ARTHROPLASTY BIPOLAR HIP;  Surgeon: Mauri Pole, MD;  Location: [facility];  Service: Orthopedics;  Laterality: Right;  . Esophagogastroduodenoscopy (egd) with esophageal dilation N/A 11/25/2012    Procedure: ESOPHAGOGASTRODUODENOSCOPY (EGD) WITH ESOPHAGEAL DILATION;  Surgeon: Jerene Bears, MD;  Location: [facility];  Service: Gastroenterology;  Laterality: N/A;  . Esophagogastroduodenoscopy N/A 07/26/2013    Procedure: ESOPHAGOGASTRODUODENOSCOPY (EGD);  Surgeon: Irene Shipper, MD;  Location: [facility];  Service: Endoscopy;  Laterality: N/A;  . Esophageal manometry N/A 08/25/2013    Procedure: ESOPHAGEAL MANOMETRY (EM);  Surgeon: Jerene Bears, MD;  Location: [facility];  Service: Gastroenterology;  Laterality: N/A;  . Esophagogastroduodenoscopy (egd) with propofol N/A 09/18/2013    Procedure: ESOPHAGOGASTRODUODENOSCOPY (EGD) WITH PROPOFOL;  Surgeon: Lafayette Dragon, MD;  Location: [facility];  Service: Endoscopy;  Laterality: N/A;  . Botox injection N/A 09/18/2013    Procedure: BOTOX INJECTION;  Surgeon: Lafayette Dragon, MD;  Location: [facility];  Service: Endoscopy;  Laterality: N/A;   Social History:   reports that she has never smoked. She has never used smokeless tobacco. She reports that she does not drink alcohol or use illicit drugs.  Family History  Problem Relation Age of Onset  . Colon cancer Son 13  . Crohn's disease Son   . Cancer Maternal Aunt     Medications: Patient's Medications  New Prescriptions   No medications on file  Previous Medications   ALBUTEROL (PROVENTIL HFA;VENTOLIN HFA) 108 (90 BASE) MCG/ACT INHALER    Inhale 2 puffs into the lungs every 6 (six) hours as needed for wheezing or shortness of breath.   ATORVASTATIN (LIPITOR) 20 MG TABLET    Take 20 mg by mouth every evening.    FEEDING SUPPLEMENT (RESOURCE BREEZE) LIQD    Take 90 mLs by mouth 4 (four) times daily.     FLUTICASONE-SALMETEROL (ADVAIR) 250-50 MCG/DOSE AEPB    Inhale 1 puff into the lungs every 12 (twelve) hours.   HYDROCODONE-ACETAMINOPHEN (NORCO) 10-325 MG PER TABLET    Take 1 tablet by mouth 4 (four) times daily.   HYDROXYUREA (HYDREA) 500 MG CAPSULE    Take 500-1,000 mg by mouth See admin instructions. Take 1000 mg on Monday and 500 mg on Sunday, Wednesday and Friday   LORAZEPAM (ATIVAN) 0.5 MG TABLET    Take 0.5 mg by mouth every 4 (four) hours as needed for anxiety (and nausea).    MEMANTINE HCL ER (NAMENDA XR) 14 MG CP24    Take 14 mg by mouth daily.   METOPROLOL TARTRATE (LOPRESSOR) 25 MG TABLET    Take 25 mg by mouth 2 (two) times daily.   MONTELUKAST (SINGULAIR) 10 MG TABLET    Take 10 mg by mouth at bedtime.   MULTIPLE VITAMINS-MINERALS (CERTAVITE SENIOR/ANTIOXIDANT PO)  Take 1 tablet by mouth every morning.   NIFEDIPINE (PROCARDIA) 10 MG CAPSULE    Take 1 capsule (10 mg total) by mouth 3 (three) times daily before meals.   NITROGLYCERIN (NITROSTAT) 0.4 MG SL TABLET    Place 0.4 mg under the tongue every 5 (five) minutes as needed for chest pain.   OMEPRAZOLE (PRILOSEC) 20 MG CAPSULE    Take 20 mg by mouth every morning.    OXYCODONE (OXY IR/ROXICODONE) 5 MG IMMEDIATE RELEASE TABLET    Take 5 mg by mouth every 4 (four) hours as needed for severe pain.   PROMETHAZINE (PHENERGAN) 12.5 MG SUPPOSITORY    Place 12.5 mg rectally every 6 (six) hours as needed for nausea or vomiting.   PROMETHAZINE (PHENERGAN) 12.5 MG TABLET    Take 12.5 mg by mouth every 6 (six) hours as needed for nausea or vomiting.   RIVAROXABAN (XARELTO) 15 MG TABS TABLET    Take 15 mg by mouth daily.   SERTRALINE (ZOLOFT) 50 MG TABLET    Take 50 mg by mouth every evening.   Modified Medications   No medications on file  Discontinued Medications   No medications on file     Physical Exam: Physical Exam  Constitutional: She is oriented to person, place, and time. She appears well-developed and well-nourished. No  distress.  HENT:  Head: Normocephalic and atraumatic.  Eyes: Conjunctivae and EOM are normal. Pupils are equal, round, and reactive to light.  Neck: Normal range of motion. Neck supple. No JVD present. No thyromegaly present.  Cardiovascular: Normal rate, regular rhythm and normal heart sounds.   Pulmonary/Chest: Effort normal. She has decreased breath sounds. She has no wheezes. She has no rales.  Decreased breath sounds Bilat. Lungs.   Abdominal: Soft. Bowel sounds are normal. There is no tenderness.  Musculoskeletal: Normal range of motion. She exhibits tenderness (chronic lower back and the right hip since the fx. better controloled with scheduled Norco. ). She exhibits no edema.  Lower back-baseline: pain at end of day occasionally.   Lymphadenopathy:    She has no cervical adenopathy.  Neurological: She is alert and oriented to person, place, and time. She has normal reflexes. No cranial nerve deficit. She exhibits normal muscle tone. Coordination normal.  Skin: Skin is warm and dry. No rash noted. She is not diaphoretic. No erythema (LLE).  The right hip surgical incision healed.   Psychiatric: Her mood appears anxious. Her affect is not angry, not blunt, not labile and not inappropriate. Her speech is not delayed, not tangential and not slurred. She is hyperactive and slowed. She is not agitated, not aggressive, not withdrawn and not combative. Thought content is not paranoid and not delusional. Cognition and memory are impaired. She does not express impulsivity or inappropriate judgment. She exhibits a depressed mood. She exhibits abnormal recent memory.    Filed Vitals:   07/23/14 1639  BP: 116/62  Pulse: 78  Temp: 97.6 F (36.4 C)  TempSrc: Tympanic  Resp: 18      Labs reviewed: Basic Metabolic Panel:  Recent Labs  07/26/13 1131 08/28/13  NA 144 141  K 4.0 4.2  CL 104  --   CO2 23  --   GLUCOSE 93  --   BUN 23 17  CREATININE 0.84 0.8  CALCIUM 10.0  --   TSH  --   1.32   Liver Function Tests:  Recent Labs  08/28/13  AST 18  ALT 12  ALKPHOS 56  No results for input(s): LIPASE, AMYLASE in the last 8760 hours. No results for input(s): AMMONIA in the last 8760 hours. CBC:  Recent Labs  07/26/13 1131  01/15/14 03/17/14 07/14/14  WBC 7.9  < > 6.7 7.9 8.2  NEUTROABS 7.1  --   --   --   --   HGB 12.7  < > 11.8* 13.9 12.9  HCT 39.4  < > 36 41 37  MCV 112.6*  --   --   --   --   PLT 306  < > 276 349 276  < > = values in this interval not displayed. Lipid Panel: No results for input(s): CHOL, HDL, LDLCALC, TRIG, CHOLHDL, LDLDIRECT in the last 8760 hours.  Past Procedures:  09/09/12  CT head:  IMPRESSION: No skull fracture or intracranial hemorrhage.  Small vessel disease type changes without CT evidence of large acute infarct.  X-ray right hip  IMPRESSION: Right femoral prosthesis without acute complication  X-ray Lumbar spine  IMPRESSION: Diffuse lumbar spondylosis. No evidence of fracture.  X--ray R hip  IMPRESSION: Displaced right subcapital hip fracture.  11/25/12 GED: Esophageal dysmotility w/o definitive stricture. S/p empiric dilation in the lower third of the esophagus and across GE junction with TTS balloon to 31mm. Recommended diet as tolerated.   06/25/13 X-ray lumbar spine: disc disease, slight rotoscoliosis curvature to the left, the is a right hip prosthesis present, there is an eggshell calcification in the right upper quadrant of uncertain etiology.   Assessment/Plan Essential thrombocythemia 07/14/14 Plt 276    COPD with asthma dc'd Epipen prn and Immunotherapy per  Allergy 01/17/13 --stable, takes Montelukast 10mg  daily, Breo Ellipta I daily inhaled, Ventolin HFA 2 puffs q6h prn.    GERD Stable. Omeprazole 20mg  daily. Prn Promethazine 12.5mg  q6h available to her.      BACK PAIN, LUMBAR Walking with walker as tolerated, continues with w/c prn. Continue Norco 10/325mg  qid. F/u [name].    11/10/13 dc Oxycodone prn-not used in 60 days.  Pain is reasonable controlled.     Osteoporosis 06/15/14 dc Vit D 1000u po daily per pharm.     DVT (deep venous thrombosis) Switched to [place] since 09/12/12. Left     Anxiety and depression Stabilized. Sertraline to 50mg  daily 07/16/13. 08/26/13 Mirtazapine 7.5mg  qhs  04/13/14 taper off Mirtazapine.  --mood is stable.     Deep vein thrombosis of left lower extremity Switched to Xeralto since 09/12/12. Left     Dementia Presently taking Namenda 14mg  daily and SNF for care needs   Achalasia of esophagus Stable. Nifedipine 10mg  tid ac meals.     Essential hypertension, benign takes Metoprolol 25mg  bid. Controlled. Prn NTG available to her-no angina since last visited.        Family/ Staff Communication: observe the patient  Goals of Care: SNF  Labs/tests ordered: none

## 2014-07-23 NOTE — Assessment & Plan Note (Signed)
Stabilized. Sertraline to 50mg  daily 07/16/13. 08/26/13 Mirtazapine 7.5mg  qhs  04/13/14 taper off Mirtazapine.  --mood is stable.

## 2014-07-23 NOTE — Assessment & Plan Note (Signed)
Switched to [place] since 09/12/12. Left

## 2014-08-03 ENCOUNTER — Non-Acute Institutional Stay (SKILLED_NURSING_FACILITY): Payer: Medicare Other | Admitting: Nurse Practitioner

## 2014-08-03 ENCOUNTER — Encounter: Payer: Self-pay | Admitting: Nurse Practitioner

## 2014-08-03 DIAGNOSIS — F0390 Unspecified dementia without behavioral disturbance: Secondary | ICD-10-CM

## 2014-08-03 DIAGNOSIS — D473 Essential (hemorrhagic) thrombocythemia: Secondary | ICD-10-CM | POA: Diagnosis not present

## 2014-08-03 DIAGNOSIS — I82402 Acute embolism and thrombosis of unspecified deep veins of left lower extremity: Secondary | ICD-10-CM | POA: Diagnosis not present

## 2014-08-03 DIAGNOSIS — J449 Chronic obstructive pulmonary disease, unspecified: Secondary | ICD-10-CM | POA: Diagnosis not present

## 2014-08-03 DIAGNOSIS — K219 Gastro-esophageal reflux disease without esophagitis: Secondary | ICD-10-CM

## 2014-08-03 DIAGNOSIS — M5440 Lumbago with sciatica, unspecified side: Secondary | ICD-10-CM

## 2014-08-03 DIAGNOSIS — F419 Anxiety disorder, unspecified: Secondary | ICD-10-CM

## 2014-08-03 DIAGNOSIS — K220 Achalasia of cardia: Secondary | ICD-10-CM | POA: Diagnosis not present

## 2014-08-03 DIAGNOSIS — I10 Essential (primary) hypertension: Secondary | ICD-10-CM | POA: Diagnosis not present

## 2014-08-03 DIAGNOSIS — F418 Other specified anxiety disorders: Secondary | ICD-10-CM | POA: Diagnosis not present

## 2014-08-03 DIAGNOSIS — J4489 Other specified chronic obstructive pulmonary disease: Secondary | ICD-10-CM

## 2014-08-03 DIAGNOSIS — F329 Major depressive disorder, single episode, unspecified: Secondary | ICD-10-CM

## 2014-08-03 DIAGNOSIS — F32A Depression, unspecified: Secondary | ICD-10-CM

## 2014-08-03 NOTE — Assessment & Plan Note (Signed)
Stabilized. Sertraline to 50mg  daily 07/16/13. 08/26/13 Mirtazapine 7.5mg  qhs  04/13/14 taper off Mirtazapine.  --mood is stable.

## 2014-08-03 NOTE — Assessment & Plan Note (Signed)
takes Metoprolol 25mg  bid. Controlled. Prn NTG available to her-no angina since last visited.

## 2014-08-03 NOTE — Assessment & Plan Note (Signed)
Presently taking Namenda 14mg  daily and SNF for care needs

## 2014-08-03 NOTE — Progress Notes (Signed)
Patient ID: Sherri Crawford, female   DOB: 1920/12/28, 81 y.o.   MRN: [id number]   Code Status: DNR  Allergies  Allergen Reactions  . Sulfonamide Derivatives Other (See Comments)    Pt is unsure of reaction    Chief Complaint  Patient presents with  . Medical Management of Chronic Issues  . Acute Visit    cough, congestion, T 100.8    HPI: Patient is a 81 y.o. female seen in the SNF at [facility] today for evaluation of cough, congestion, fever, and chronic medical conditions.  Problem List Items Addressed This Visit    Essential thrombocythemia    07/14/14 Plt 276       Essential hypertension    takes Metoprolol 25mg  bid. Controlled. Prn NTG available to her-no angina since last visited.         COPD with asthma - Primary    dc'd Epipen prn and Immunotherapy per [facility] 01/17/13 --maintenance, takes Montelukast 10mg  daily, Breo Ellipta I daily inhaled, Ventolin HFA 2 puffs q6h prn.  -- 08/03/14 congestive cough and fever: Augmentin 875mg  q12h x 7 days, Medrol dose pk. Observe the patient.        GERD    Stable. Omeprazole 20mg  daily. Prn Promethazine 12.5mg  q6h available to her.         BACK PAIN, LUMBAR    Walking with walker as tolerated, continues with w/c prn. Continue Norco 10/325mg  qid. F/u [name].  11/10/13 dc Oxycodone prn-not used in 60 days.  Pain is reasonable controlled.         Anxiety and depression    Stabilized. Sertraline to 50mg  daily 07/16/13. 08/26/13 Mirtazapine 7.5mg  qhs  04/13/14 taper off Mirtazapine.  --mood is stable.         Deep vein thrombosis of left lower extremity    Switched to Xeralto since 09/12/12. Left         Dementia    Presently taking Namenda 14mg  daily and SNF for care needs       Achalasia of esophagus    Stable. Nifedipine 10mg  tid ac meals.       Essential hypertension, benign    takes Metoprolol 25mg  bid. Controlled. Prn NTG available to her-no angina since last visited.             Review of Systems:  Review of Systems  Constitutional: Positive for fever. Negative for chills and diaphoresis.  HENT: Positive for hearing loss. Negative for congestion, ear discharge, ear pain, nosebleeds, sore throat and tinnitus.   Eyes: Negative for photophobia, pain, discharge and redness.  Respiratory: Positive for cough. Negative for shortness of breath, wheezing and stridor.   Cardiovascular: Negative for chest pain, palpitations and leg swelling.  Gastrointestinal: Negative for nausea, vomiting, abdominal pain, diarrhea, constipation and blood in stool.  Endocrine: Negative for polydipsia.  Genitourinary: Positive for frequency. Negative for dysuria, urgency, hematuria and flank pain.  Musculoskeletal: Positive for back pain. Negative for myalgias and neck pain.  Skin: Negative for rash.  Allergic/Immunologic: Negative for environmental allergies.  Neurological: Negative for dizziness, tremors, seizures, weakness and headaches.  Hematological: Does not bruise/bleed easily.  Psychiatric/Behavioral: Negative for suicidal ideas and hallucinations. The patient is nervous/anxious.      Past Medical History  Diagnosis Date  . Macular degeneration   . COPD (chronic obstructive pulmonary disease)   . Hypertension   . Coronary artery disease   . Hypercholesterolemia   . GERD (gastroesophageal reflux disease)   . Asthmatic  bronchitis   . History of colonic polyps   . DJD (degenerative joint disease)   . Lumbar back pain   . Osteoporosis   . History of transient ischemic attack (TIA)   . Anxiety   . Essential thrombocythemia   . Diverticulosis 05/10/2012  . Osteoarthrosis, unspecified whether generalized or localized, unspecified site 05/09/2012  . Memory loss 05/09/2012  . Abnormal weight gain 05/09/2012  . Phlebitis and thrombophlebitis of other deep vessels of lower extremities 07/10/2010  . Essential thrombocythemia 06/09/2009  . Depression   . Esophageal stricture     Past Surgical History  Procedure Laterality Date  . Appendectomy    . Cholecystectomy    . Tah and bso  08/21/1995  . Cataract extraction w/ intraocular lens  implant, bilateral    . Abdominal hysterectomy    . Hip arthroplasty Right 09/09/2012    Procedure: ARTHROPLASTY BIPOLAR HIP;  Surgeon: Mauri Pole, MD;  Location: [facility];  Service: Orthopedics;  Laterality: Right;  . Esophagogastroduodenoscopy (egd) with esophageal dilation N/A 11/25/2012    Procedure: ESOPHAGOGASTRODUODENOSCOPY (EGD) WITH ESOPHAGEAL DILATION;  Surgeon: Jerene Bears, MD;  Location: [facility];  Service: Gastroenterology;  Laterality: N/A;  . Esophagogastroduodenoscopy N/A 07/26/2013    Procedure: ESOPHAGOGASTRODUODENOSCOPY (EGD);  Surgeon: Irene Shipper, MD;  Location: [facility] ENDOSCOPY;  Service: Endoscopy;  Laterality: N/A;  . Esophageal manometry N/A 08/25/2013    Procedure: ESOPHAGEAL MANOMETRY (EM);  Surgeon: Jerene Bears, MD;  Location: [facility] ENDOSCOPY;  Service: Gastroenterology;  Laterality: N/A;  . Esophagogastroduodenoscopy (egd) with propofol N/A 09/18/2013    Procedure: ESOPHAGOGASTRODUODENOSCOPY (EGD) WITH PROPOFOL;  Surgeon: Lafayette Dragon, MD;  Location: [facility];  Service: Endoscopy;  Laterality: N/A;  . Botox injection N/A 09/18/2013    Procedure: BOTOX INJECTION;  Surgeon: Lafayette Dragon, MD;  Location: [facility] ENDOS[facility];  Service: Endoscopy;  Laterality: N/A;   Social History:   reports that she has never smoked. She has never used smokeless tobacco. She reports that she does not drink alcohol or use illicit drugs.  Family History  Problem Relation Age of Onset  . Colon cancer Son 11  . Crohn's disease Son   . Cancer Maternal Aunt     Medications: Patient's Medications  New Prescriptions   No medications on file  Previous Medications   ALBUTEROL (PROVENTIL HFA;VENTOLIN HFA) 108 (90 BASE) MCG/ACT INHALER    Inhale 2 puffs into the lungs every 6 (six) hours as needed for wheezing or shortness of  breath.   ATORVASTATIN (LIPITOR) 20 MG TABLET    Take 20 mg by mouth every evening.    FEEDING SUPPLEMENT (RESOURCE BREEZE) LIQD    Take 90 mLs by mouth 4 (four) times daily.    FLUTICASONE-SALMETEROL (ADVAIR) 250-50 MCG/DOSE AEPB    Inhale 1 puff into the lungs every 12 (twelve) hours.   HYDROCODONE-ACETAMINOPHEN (NORCO) 10-325 MG PER TABLET    Take 1 tablet by mouth 4 (four) times daily.   HYDROXYUREA (HYDREA) 500 MG CAPSULE    Take 500-1,000 mg by mouth See admin instructions. Take 1000 mg on Monday and 500 mg on Sunday, Wednesday and Friday   LORAZEPAM (ATIVAN) 0.5 MG TABLET    Take 0.5 mg by mouth every 4 (four) hours as needed for anxiety (and nausea).    MEMANTINE HCL ER (NAMENDA XR) 14 MG CP24    Take 14 mg by mouth daily.   METOPROLOL TARTRATE (LOPRESSOR) 25 MG TABLET    Take 25 mg by mouth 2 (  two) times daily.   MONTELUKAST (SINGULAIR) 10 MG TABLET    Take 10 mg by mouth at bedtime.   MULTIPLE VITAMINS-MINERALS (CERTAVITE SENIOR/ANTIOXIDANT PO)    Take 1 tablet by mouth every morning.   NIFEDIPINE (PROCARDIA) 10 MG CAPSULE    Take 1 capsule (10 mg total) by mouth 3 (three) times daily before meals.   NITROGLYCERIN (NITROSTAT) 0.4 MG SL TABLET    Place 0.4 mg under the tongue every 5 (five) minutes as needed for chest pain.   OMEPRAZOLE (PRILOSEC) 20 MG CAPSULE    Take 20 mg by mouth every morning.    OXYCODONE (OXY IR/ROXICODONE) 5 MG IMMEDIATE RELEASE TABLET    Take 5 mg by mouth every 4 (four) hours as needed for severe pain.   PROMETHAZINE (PHENERGAN) 12.5 MG SUPPOSITORY    Place 12.5 mg rectally every 6 (six) hours as needed for nausea or vomiting.   PROMETHAZINE (PHENERGAN) 12.5 MG TABLET    Take 12.5 mg by mouth every 6 (six) hours as needed for nausea or vomiting.   RIVAROXABAN (XARELTO) 15 MG TABS TABLET    Take 15 mg by mouth daily.   SERTRALINE (ZOLOFT) 50 MG TABLET    Take 50 mg by mouth every evening.   Modified Medications   No medications on file  Discontinued Medications    No medications on file     Physical Exam: Physical Exam  Constitutional: She is oriented to person, place, and time. She appears well-developed and well-nourished. No distress.  HENT:  Head: Normocephalic and atraumatic.  Eyes: Conjunctivae and EOM are normal. Pupils are equal, round, and reactive to light.  Neck: Normal range of motion. Neck supple. No JVD present. No thyromegaly present.  Cardiovascular: Normal rate, regular rhythm and normal heart sounds.   Pulmonary/Chest: Effort normal. She has decreased breath sounds. She has no wheezes. She has no rales.  Decreased breath sounds Bilat. Lungs.   Abdominal: Soft. Bowel sounds are normal. There is no tenderness.  Musculoskeletal: Normal range of motion. She exhibits tenderness (chronic lower back and the right hip since the fx. better controloled with scheduled Norco. ). She exhibits no edema.  Lower back-baseline: pain at end of day occasionally.   Lymphadenopathy:    She has no cervical adenopathy.  Neurological: She is alert and oriented to person, place, and time. She has normal reflexes. No cranial nerve deficit. She exhibits normal muscle tone. Coordination normal.  Skin: Skin is warm and dry. No rash noted. She is not diaphoretic. No erythema (LLE).  The right hip surgical incision healed.   Psychiatric: Her mood appears anxious. Her affect is not angry, not blunt, not labile and not inappropriate. Her speech is not delayed, not tangential and not slurred. She is hyperactive and slowed. She is not agitated, not aggressive, not withdrawn and not combative. Thought content is not paranoid and not delusional. Cognition and memory are impaired. She does not express impulsivity or inappropriate judgment. She exhibits a depressed mood. She exhibits abnormal recent memory.    Filed Vitals:   08/03/14 1325  BP: 130/80  Pulse: 75  Temp: 98.6 F (37 C)  TempSrc: Tympanic  Resp: 20      Labs reviewed: Basic Metabolic  Panel:  Recent Labs  08/28/13  NA 141  K 4.2  BUN 17  CREATININE 0.8  TSH 1.32   Liver Function Tests:  Recent Labs  08/28/13  AST 18  ALT 12  ALKPHOS 56   No results for  input(s): LIPASE, AMYLASE in the last 8760 hours. No results for input(s): AMMONIA in the last 8760 hours. CBC:  Recent Labs  01/15/14 03/17/14 07/14/14  WBC 6.7 7.9 8.2  HGB 11.8* 13.9 12.9  HCT 36 41 37  PLT 276 349 276   Lipid Panel: No results for input(s): CHOL, HDL, LDLCALC, TRIG, CHOLHDL, LDLDIRECT in the last 8760 hours.  Past Procedures:  09/09/12  CT head:  IMPRESSION: No skull fracture or intracranial hemorrhage.  Small vessel disease type changes without CT evidence of large acute infarct.  X-ray right hip  IMPRESSION: Right femoral prosthesis without acute complication  X-ray Lumbar spine  IMPRESSION: Diffuse lumbar spondylosis. No evidence of fracture.  X--ray R hip  IMPRESSION: Displaced right subcapital hip fracture.  11/25/12 GED: Esophageal dysmotility w/o definitive stricture. S/p empiric dilation in the lower third of the esophagus and across GE junction with TTS balloon to 77mm. Recommended diet as tolerated.   06/25/13 X-ray lumbar spine: disc disease, slight rotoscoliosis curvature to the left, the is a right hip prosthesis present, there is an eggshell calcification in the right upper quadrant of uncertain etiology.   Assessment/Plan COPD with asthma dc'd Epipen prn and Immunotherapy per [facility] 01/17/13 --maintenance, takes Montelukast 10mg  daily, Breo Ellipta I daily inhaled, Ventolin HFA 2 puffs q6h prn.  -- 08/03/14 congestive cough and fever: Augmentin 875mg  q12h x 7 days, Medrol dose pk. Observe the patient.     Essential hypertension, benign takes Metoprolol 25mg  bid. Controlled. Prn NTG available to her-no angina since last visited.       Achalasia of esophagus Stable. Nifedipine 10mg  tid ac meals.    Deep vein thrombosis of left  lower extremity Switched to Xeralto since 09/12/12. Left      Dementia Presently taking Namenda 14mg  daily and SNF for care needs    Anxiety and depression Stabilized. Sertraline to 50mg  daily 07/16/13. 08/26/13 Mirtazapine 7.5mg  qhs  04/13/14 taper off Mirtazapine.  --mood is stable.      BACK PAIN, LUMBAR Walking with walker as tolerated, continues with w/c prn. Continue Norco 10/325mg  qid. F/u [name].  11/10/13 dc Oxycodone prn-not used in 60 days.  Pain is reasonable controlled.      GERD Stable. Omeprazole 20mg  daily. Prn Promethazine 12.5mg  q6h available to her.      Essential hypertension takes Metoprolol 25mg  bid. Controlled. Prn NTG available to her-no angina since last visited.      Essential thrombocythemia 07/14/14 Plt 276      Family/ Staff Communication: observe the patient  Goals of Care: SNF  Labs/tests ordered: none

## 2014-08-03 NOTE — Assessment & Plan Note (Signed)
dc'd Epipen prn and Immunotherapy per [facility] 01/17/13 --maintenance, takes Montelukast 10mg  daily, Breo Ellipta I daily inhaled, Ventolin HFA 2 puffs q6h prn.  -- 08/03/14 congestive cough and fever: Augmentin 875mg  q12h x 7 days, Medrol dose pk. Observe the patient.

## 2014-08-03 NOTE — Assessment & Plan Note (Signed)
07/14/14 Plt 276

## 2014-08-03 NOTE — Assessment & Plan Note (Signed)
Switched to [place] since 09/12/12. Left

## 2014-08-03 NOTE — Assessment & Plan Note (Signed)
Stable. Omeprazole 20mg  daily. Prn Promethazine 12.5mg  q6h available to her.

## 2014-08-03 NOTE — Assessment & Plan Note (Signed)
Stable. Nifedipine 10mg  tid ac meals.

## 2014-08-03 NOTE — Assessment & Plan Note (Signed)
Walking with walker as tolerated, continues with w/c prn. Continue Norco 10/325mg  qid. F/u [name].  11/10/13 dc Oxycodone prn-not used in 60 days.  Pain is reasonable controlled.

## 2014-10-15 ENCOUNTER — Encounter (HOSPITAL_COMMUNITY): Payer: Self-pay | Admitting: *Deleted

## 2014-10-15 ENCOUNTER — Inpatient Hospital Stay (HOSPITAL_COMMUNITY)
Admission: AD | Admit: 2014-10-15 | Discharge: 2014-10-17 | DRG: 392 | Disposition: A | Discharge status: Skilled Nursing Facility | Payer: Medicare Other | Source: Ambulatory Visit | Attending: Family Medicine | Admitting: Family Medicine

## 2014-10-15 ENCOUNTER — Ambulatory Visit (INDEPENDENT_AMBULATORY_CARE_PROVIDER_SITE_OTHER): Payer: Medicare Other | Admitting: Nurse Practitioner

## 2014-10-15 ENCOUNTER — Telehealth: Payer: Self-pay | Admitting: Internal Medicine

## 2014-10-15 ENCOUNTER — Encounter: Payer: Self-pay | Admitting: Nurse Practitioner

## 2014-10-15 VITALS — BP 130/64 | HR 84 | Ht <= 58 in | Wt 105.1 lb

## 2014-10-15 DIAGNOSIS — F329 Major depressive disorder, single episode, unspecified: Secondary | ICD-10-CM | POA: Diagnosis present

## 2014-10-15 DIAGNOSIS — H3530 Unspecified macular degeneration: Secondary | ICD-10-CM | POA: Diagnosis present

## 2014-10-15 DIAGNOSIS — Z7982 Long term (current) use of aspirin: Secondary | ICD-10-CM

## 2014-10-15 DIAGNOSIS — Z96641 Presence of right artificial hip joint: Secondary | ICD-10-CM | POA: Diagnosis present

## 2014-10-15 DIAGNOSIS — M545 Low back pain: Secondary | ICD-10-CM | POA: Diagnosis present

## 2014-10-15 DIAGNOSIS — M1990 Unspecified osteoarthritis, unspecified site: Secondary | ICD-10-CM | POA: Diagnosis present

## 2014-10-15 DIAGNOSIS — D473 Essential (hemorrhagic) thrombocythemia: Secondary | ICD-10-CM | POA: Diagnosis present

## 2014-10-15 DIAGNOSIS — J45909 Unspecified asthma, uncomplicated: Secondary | ICD-10-CM | POA: Diagnosis present

## 2014-10-15 DIAGNOSIS — E7800 Pure hypercholesterolemia, unspecified: Secondary | ICD-10-CM | POA: Diagnosis present

## 2014-10-15 DIAGNOSIS — Z882 Allergy status to sulfonamides status: Secondary | ICD-10-CM

## 2014-10-15 DIAGNOSIS — Z86718 Personal history of other venous thrombosis and embolism: Secondary | ICD-10-CM

## 2014-10-15 DIAGNOSIS — Z90722 Acquired absence of ovaries, bilateral: Secondary | ICD-10-CM | POA: Diagnosis present

## 2014-10-15 DIAGNOSIS — K220 Achalasia of cardia: Secondary | ICD-10-CM | POA: Insufficient documentation

## 2014-10-15 DIAGNOSIS — B3781 Candidal esophagitis: Secondary | ICD-10-CM | POA: Diagnosis present

## 2014-10-15 DIAGNOSIS — Z7901 Long term (current) use of anticoagulants: Secondary | ICD-10-CM | POA: Diagnosis not present

## 2014-10-15 DIAGNOSIS — Z9049 Acquired absence of other specified parts of digestive tract: Secondary | ICD-10-CM | POA: Diagnosis present

## 2014-10-15 DIAGNOSIS — M810 Age-related osteoporosis without current pathological fracture: Secondary | ICD-10-CM | POA: Diagnosis present

## 2014-10-15 DIAGNOSIS — Z79899 Other long term (current) drug therapy: Secondary | ICD-10-CM | POA: Diagnosis not present

## 2014-10-15 DIAGNOSIS — Z79891 Long term (current) use of opiate analgesic: Secondary | ICD-10-CM | POA: Diagnosis not present

## 2014-10-15 DIAGNOSIS — F0390 Unspecified dementia without behavioral disturbance: Secondary | ICD-10-CM | POA: Diagnosis present

## 2014-10-15 DIAGNOSIS — R1310 Dysphagia, unspecified: Secondary | ICD-10-CM

## 2014-10-15 DIAGNOSIS — J449 Chronic obstructive pulmonary disease, unspecified: Secondary | ICD-10-CM

## 2014-10-15 DIAGNOSIS — I10 Essential (primary) hypertension: Secondary | ICD-10-CM

## 2014-10-15 DIAGNOSIS — I2510 Atherosclerotic heart disease of native coronary artery without angina pectoris: Secondary | ICD-10-CM | POA: Diagnosis present

## 2014-10-15 DIAGNOSIS — F419 Anxiety disorder, unspecified: Secondary | ICD-10-CM | POA: Diagnosis present

## 2014-10-15 DIAGNOSIS — Z86010 Personal history of colonic polyps: Secondary | ICD-10-CM

## 2014-10-15 DIAGNOSIS — K210 Gastro-esophageal reflux disease with esophagitis: Secondary | ICD-10-CM | POA: Diagnosis present

## 2014-10-15 DIAGNOSIS — Z8673 Personal history of transient ischemic attack (TIA), and cerebral infarction without residual deficits: Secondary | ICD-10-CM

## 2014-10-15 DIAGNOSIS — Z90710 Acquired absence of both cervix and uterus: Secondary | ICD-10-CM

## 2014-10-15 MED ORDER — SODIUM CHLORIDE 0.9 % IV SOLN: INTRAVENOUS | Status: DC

## 2014-10-15 NOTE — H&P (Signed)
[facility] History and Physical  Sherri Crawford YYQ:[id number] DOB: 1920/06/12 DOA: 10/15/2014  Referring physician: Tye Savoy, NP PCP: Sherri Dooms, MD   Chief Complaint: Difficulty Swallowing  HPI: Sherri Crawford is a 81 y.o. female with history of achalasia of esophagus COPD HTN HLD GERD presents as a direct admit from the office for difficulty swallowing. The patient states that she has not been able to keep any food down for the last 3 days. Patient states that she has had difficulty with swallowing everything. She states that she has had issues with water. She states that she has had this problem in the past. Today she went to GI and was told to be admitted so that she could have a EGD. She is scheduled for this possibly in the morning.   Review of Systems:  12 point ROS performed and is unremarkable other than HPI   Past Medical History  Diagnosis Date  . Macular degeneration   . COPD (chronic obstructive pulmonary disease)   . Hypertension   . Coronary artery disease   . Hypercholesterolemia   . GERD (gastroesophageal reflux disease)   . Asthmatic bronchitis   . History of colonic polyps   . DJD (degenerative joint disease)   . Lumbar back pain   . Osteoporosis   . History of transient ischemic attack (TIA)   . Anxiety   . Essential thrombocythemia   . Diverticulosis 05/10/2012  . Osteoarthrosis, unspecified whether generalized or localized, unspecified site 05/09/2012  . Memory loss 05/09/2012  . Abnormal weight gain 05/09/2012  . Phlebitis and thrombophlebitis of other deep vessels of lower extremities 07/10/2010  . Essential thrombocythemia 06/09/2009  . Depression   . Esophageal stricture    Past Surgical History  Procedure Laterality Date  . Appendectomy    . Cholecystectomy    . Tah and bso  08/21/1995  . Cataract extraction w/ intraocular lens  implant, bilateral    . Abdominal hysterectomy    . Hip arthroplasty Right 09/09/2012    Procedure:  ARTHROPLASTY BIPOLAR HIP;  Surgeon: Sherri Pole, MD;  Location: [facility];  Service: Orthopedics;  Laterality: Right;  . Esophagogastroduodenoscopy (egd) with esophageal dilation N/A 11/25/2012    Procedure: ESOPHAGOGASTRODUODENOSCOPY (EGD) WITH ESOPHAGEAL DILATION;  Surgeon: Sherri Bears, MD;  Location: [facility];  Service: Gastroenterology;  Laterality: N/A;  . Esophagogastroduodenoscopy N/A 07/26/2013    Procedure: ESOPHAGOGASTRODUODENOSCOPY (EGD);  Surgeon: Sherri Shipper, MD;  Location: [facility]COPY;  Service: Endoscopy;  Laterality: N/A;  . Esophageal manometry N/A 08/25/2013    Procedure: ESOPHAGEAL MANOMETRY (EM);  Surgeon: Sherri Bears, MD;  Location: [facility];  Service: Gastroenterology;  Laterality: N/A;  . Esophagogastroduodenoscopy (egd) with propofol N/A 09/18/2013    Procedure: ESOPHAGOGASTRODUODENOSCOPY (EGD) WITH PROPOFOL;  Surgeon: Sherri Dragon, MD;  Location: [facility];  Service: Endoscopy;  Laterality: N/A;  . Botox injection N/A 09/18/2013    Procedure: BOTOX INJECTION;  Surgeon: Sherri Dragon, MD;  Location: [facility];  Service: Endoscopy;  Laterality: N/A;   Social History:  reports that she has never smoked. She has never used smokeless tobacco. She reports that she does not drink alcohol or use illicit drugs.  Allergies  Allergen Reactions  . Sulfonamide Derivatives Other (See Comments)    Pt is unsure of reaction    Family History  Problem Relation Age of Onset  . Colon cancer Son 30  . Crohn's disease Son   . Cancer Maternal Aunt  Prior to Admission medications   Medication Sig Start Date End Date Taking? Authorizing Provider  acetaminophen (TYLENOL) 325 MG tablet Take 650 mg by mouth every 4 (four) hours as needed.    Historical Provider, MD  albuterol (PROVENTIL HFA;VENTOLIN HFA) 108 (90 BASE) MCG/ACT inhaler Inhale 2 puffs into the lungs every 6 (six) hours as needed for wheezing or shortness of breath.    Historical Provider, MD  atorvastatin  (LIPITOR) 20 MG tablet Take 20 mg by mouth every evening.  10/25/11   Sherri Space, MD  feeding supplement (RESOURCE BREEZE) LIQD Take 90 mLs by mouth 2 (two) times daily between meals.     Historical Provider, MD  Fluticasone Furoate-Vilanterol (BREO ELLIPTA) 100-25 MCG/INH AEPB Inhale 1 Dose into the lungs daily.    Historical Provider, MD  HYDROcodone-acetaminophen (NORCO) 10-325 MG per tablet Take 1 tablet by mouth 4 (four) times daily.    Historical Provider, MD  hydroxyurea (HYDREA) 500 MG capsule Take 500-1,000 mg by mouth See admin instructions. Take 1000 mg on Monday and 500 mg on Sunday, Wednesday and Friday 04/27/11   Sherri Belt, MD  LORazepam (ATIVAN) 0.5 MG tablet Take 0.5 mg by mouth every 4 (four) hours as needed for anxiety (and nausea).     Historical Provider, MD  Memantine HCl ER (NAMENDA XR) 14 MG CP24 Take 14 mg by mouth daily.    Historical Provider, MD  metoprolol tartrate (LOPRESSOR) 25 MG tablet Take 25 mg by mouth 2 (two) times daily.    Historical Provider, MD  montelukast (SINGULAIR) 10 MG tablet Take 10 mg by mouth at bedtime.    Historical Provider, MD  Multiple Vitamins-Minerals (CERTAVITE SENIOR/ANTIOXIDANT PO) Take 1 tablet by mouth every morning.    Historical Provider, MD  Multiple Vitamins-Minerals (PRESERVISION AREDS 2) CAPS Take 1 capsule by mouth 2 (two) times daily.    Historical Provider, MD  NIFEdipine (PROCARDIA) 10 MG capsule Take 1 capsule (10 mg total) by mouth 3 (three) times daily before meals. 09/18/13   Sherri Dragon, MD  nitroGLYCERIN (NITROSTAT) 0.4 MG SL tablet Place 0.4 mg under the tongue every 5 (five) minutes as needed for chest pain.    Historical Provider, MD  omeprazole (PRILOSEC) 20 MG capsule Take 20 mg by mouth every morning.  05/15/12   Historical Provider, MD  promethazine (PHENERGAN) 12.5 MG suppository Place 12.5 mg rectally every 6 (six) hours as needed for nausea or vomiting.    Historical Provider, MD  promethazine (PHENERGAN)  12.5 MG tablet Take 12.5 mg by mouth every 6 (six) hours as needed for nausea or vomiting.    Historical Provider, MD  Rivaroxaban (XARELTO) 15 MG TABS tablet Take 15 mg by mouth daily.    Historical Provider, MD  sertraline (ZOLOFT) 50 MG tablet Take 50 mg by mouth every evening.     Historical Provider, MD   Physical Exam: Filed Vitals:   10/15/14 1650 10/15/14 1653 10/15/14 2212  BP: 156/72  160/68  Pulse: 82  72  Temp: 99.8 F (37.7 C)  98.3 F (36.8 C)  TempSrc: Oral  Oral  Resp: 16  16  Height:  4\' 9"  (1.448 m)   Weight:  47.684 kg (105 lb 2 oz)   SpO2: 99%  96%    Wt Readings from Last 3 Encounters:  10/15/14 47.684 kg (105 lb 2 oz)  10/15/14 47.684 kg (105 lb 2 oz)  11/24/13 47.764 kg (105 lb 4.8 oz)    General:  Appears calm and comfortable Eyes: PERRL, normal lids, irises & conjunctiva ENT: grossly normal hearing, lips & tongue Neck: no LAD, masses or thyromegaly Cardiovascular: RRR, no m/r/g. No LE edema Respiratory: CTA bilaterally, no w/r/r. Normal respiratory effort. Abdomen: soft, ntnd Skin: no rash or induration seen on limited exam Musculoskeletal: grossly normal tone BUE/BLE Psychiatric: grossly normal mood and affect Neurologic: grossly non-focal.          Labs on Admission:  Basic Metabolic Panel: No results for input(s): NA, K, CL, CO2, GLUCOSE, BUN, CREATININE, CALCIUM, MG, PHOS in the last 168 hours. Liver Function Tests: No results for input(s): AST, ALT, ALKPHOS, BILITOT, PROT, ALBUMIN in the last 168 hours. No results for input(s): LIPASE, AMYLASE in the last 168 hours. No results for input(s): AMMONIA in the last 168 hours. CBC: No results for input(s): WBC, NEUTROABS, HGB, HCT, MCV, PLT in the last 168 hours. Cardiac Enzymes: No results for input(s): CKTOTAL, CKMB, CKMBINDEX, TROPONINI in the last 168 hours.  BNP (last 3 results) No results for input(s): BNP in the last 8760 hours.  ProBNP (last 3 results) No results for input(s):  PROBNP in the last 8760 hours.  CBG: No results for input(s): GLUCAP in the last 168 hours.  Radiological Exams on Admission: No results found.    Assessment/Plan Principal Problem:   Achalasia of esophagus Active Problems:   HYPERCHOLESTEROLEMIA   Essential hypertension   COPD with asthma   1. Achalasia of Esophagus with Dysphagia -patient has not been able to swallow anything -will be kept NPO for now -for possible EGD per GI notes -will hold all anticoagulants -will Start on IVF until her dysphagia issue is resolved  2. Hypertension -will continue with current medications -monitor pressures  3. Hyperlipidemia -on lipitor  4. COPD with asthma -will continue with inhalers singulair  5. GERD -on PPI  6. Anxiety disorder -anxiolytics as ordered     Code Status: Full Code (must indicate code status--if unknown or must be presumed, indicate so) DVT Prophylaxis:SCD Family Communication: none (indicate person spoken with, if applicable, with phone number if by telephone) Disposition Plan: home (indicate anticipated LOS)  Time spent: 61min  KHAN,SAADAT A [facility] Pager [phone]

## 2014-10-15 NOTE — Telephone Encounter (Signed)
Patient with severe dysphagia according to her son.  Reports that mom is not able to swallow her medications this am and has had some vomiting again after meals.  She will be scheduled to see Tye Savoy RNP this afternoon at 2:00

## 2014-10-15 NOTE — Progress Notes (Signed)
Paged and notified admitting MD Erlinda Hong that patient has arrived to the floor and orders are needed Neta Mends RN 6:32 PM 10-15-2014

## 2014-10-15 NOTE — Progress Notes (Signed)
HPI:   Patient is a 81 year old female known to Dr. Olevia Perches. She has a history of recurrent candida esophagitis and a history of achalasia. Patient had an EGD May 2015 with removal of food and debris from the esophagus. Subsequent upper endoscopy with botox done July 2015 with excellent results until just a few days ago. Over the last 2 days patient has been unable to take any of her medications. She cannot swallow water and is having some difficulty managing secretions. No odynophagia. Patient has COPD she is slightly more SOB than usual. No other complaints.   Past Medical History  Diagnosis Date  . Macular degeneration   . COPD (chronic obstructive pulmonary disease)   . Hypertension   . Coronary artery disease   . Hypercholesterolemia   . GERD (gastroesophageal reflux disease)   . Asthmatic bronchitis   . History of colonic polyps   . DJD (degenerative joint disease)   . Lumbar back pain   . Osteoporosis   . History of transient ischemic attack (TIA)   . Anxiety   . Essential thrombocythemia   . Diverticulosis 05/10/2012  . Osteoarthrosis, unspecified whether generalized or localized, unspecified site 05/09/2012  . Memory loss 05/09/2012  . Phlebitis and thrombophlebitis of other deep vessels of lower extremities 07/10/2010  . Essential thrombocythemia 06/09/2009  . Depression     Family History  Problem Relation Age of Onset  . Colon cancer Son 63  . Crohn's disease Son   . Cancer Maternal Aunt    Social History  Substance Use Topics  . Smoking status: Never Smoker   . Smokeless tobacco: Never Used  . Alcohol Use: No   Current Outpatient Prescriptions  Medication Sig Dispense Refill  . acetaminophen (TYLENOL) 325 MG tablet Take 650 mg by mouth every 4 (four) hours as needed.    Marland Kitchen albuterol (PROVENTIL HFA;VENTOLIN HFA) 108 (90 BASE) MCG/ACT inhaler Inhale 2 puffs into the lungs every 6 (six) hours as needed for wheezing or shortness of breath.    Marland Kitchen atorvastatin  (LIPITOR) 20 MG tablet Take 20 mg by mouth every evening.     . feeding supplement (RESOURCE BREEZE) LIQD Take 90 mLs by mouth 2 (two) times daily between meals.     . Fluticasone Furoate-Vilanterol (BREO ELLIPTA) 100-25 MCG/INH AEPB Inhale 1 Dose into the lungs daily.    Marland Kitchen HYDROcodone-acetaminophen (NORCO) 10-325 MG per tablet Take 1 tablet by mouth 4 (four) times daily.    . hydroxyurea (HYDREA) 500 MG capsule Take 500-1,000 mg by mouth See admin instructions. Take 1000 mg on Monday and 500 mg on Sunday, Wednesday and Friday    . LORazepam (ATIVAN) 0.5 MG tablet Take 0.5 mg by mouth every 4 (four) hours as needed for anxiety (and nausea).     . Memantine HCl ER (NAMENDA XR) 14 MG CP24 Take 14 mg by mouth daily.    . metoprolol tartrate (LOPRESSOR) 25 MG tablet Take 25 mg by mouth 2 (two) times daily.    . montelukast (SINGULAIR) 10 MG tablet Take 10 mg by mouth at bedtime.    . Multiple Vitamins-Minerals (CERTAVITE SENIOR/ANTIOXIDANT PO) Take 1 tablet by mouth every morning.    . Multiple Vitamins-Minerals (PRESERVISION AREDS 2) CAPS Take 1 capsule by mouth 2 (two) times daily.    Marland Kitchen NIFEdipine (PROCARDIA) 10 MG capsule Take 1 capsule (10 mg total) by mouth 3 (three) times daily before meals. 90 capsule 1  . nitroGLYCERIN (NITROSTAT) 0.4 MG SL tablet  Place 0.4 mg under the tongue every 5 (five) minutes as needed for chest pain.    Marland Kitchen omeprazole (PRILOSEC) 20 MG capsule Take 20 mg by mouth every morning.     . promethazine (PHENERGAN) 12.5 MG suppository Place 12.5 mg rectally every 6 (six) hours as needed for nausea or vomiting.    . promethazine (PHENERGAN) 12.5 MG tablet Take 12.5 mg by mouth every 6 (six) hours as needed for nausea or vomiting.    . Rivaroxaban (XARELTO) 15 MG TABS tablet Take 15 mg by mouth daily.    . sertraline (ZOLOFT) 50 MG tablet Take 50 mg by mouth every evening.      No current facility-administered medications for this visit.   Allergies  Allergen Reactions  .  Sulfonamide Derivatives Other (See Comments)    Pt is unsure of reaction    Review of Systems: All systems reviewed and negative except where noted in HPI.   Physical Exam: BP 130/64 mmHg  Pulse 84  Ht 4\' 9"  (1.448 m)  Wt 105 lb 2 oz (47.684 kg)  BMI 22.74 kg/m2 Constitutional: Pleasant, white elderly female in wheelchair in no acute distress. HEENT: Hard of hearing. Normocephalic and atraumatic. Conjunctivae are normal. No scleral icterus. Neck supple.  Cardiovascular: Normal rate, regular rhythm.  Pulmonary/chest: Effort normal and breath sounds normal. No wheezing, rales or rhonchi. Abdominal: limited exam, in wheelchair  Abdomen soft, bowel sounds active throughout.  Extremities: no edema Lymphadenopathy: No cervical adenopathy noted. Neurological: Alert and oriented to person place and time. Skin: Skin is warm and dry. No rashes noted. Psychiatric: Normal mood and affect. Behavior is normal.   ASSESSMENT AND PLAN:   86. 81 year old female with history of achalasia/food impaction / Candida esophagitis. Patient did well for one year following EGD with Botox injection but now with recurrent dysphagia. Patient unable to swallow any of her medications or fluids for 2 days now. Rule out recurrent candida esophagitis and/or food impaction given history of achalasia. Patient needs IV fluids and her medications switched to IV form until resolution of dysphagia. [facility] kindly agreed to admit the patient. I spoke with our inpatient team, if medically stable patient will likely need upper endoscopy tomorrow. Takes Xarelto but none in two days per son. Given DVT history Hospitalist to start IV heparin but stop it at 2am in preparation for EGD tomorrow.  2. Multiple medical problems as listed in PMH. Patient on Xarelto for history of DVT but unable to tolerate any of her PO meds for two days per son.

## 2014-10-16 ENCOUNTER — Encounter (HOSPITAL_COMMUNITY): Payer: Self-pay | Admitting: *Deleted

## 2014-10-16 ENCOUNTER — Encounter (HOSPITAL_COMMUNITY)
Admission: AD | Disposition: A | Discharge status: Skilled Nursing Facility | Payer: Self-pay | Source: Ambulatory Visit | Attending: Family Medicine

## 2014-10-16 ENCOUNTER — Inpatient Hospital Stay (HOSPITAL_COMMUNITY): Payer: Medicare Other | Admitting: Certified Registered Nurse Anesthetist

## 2014-10-16 HISTORY — PX: ESOPHAGOGASTRODUODENOSCOPY: SHX5428

## 2014-10-16 HISTORY — PX: BOTOX INJECTION: SHX5754

## 2014-10-16 LAB — CBC
HCT: 43.3 % (ref 36.0–46.0)
Hemoglobin: 14 g/dL (ref 12.0–15.0)
MCH: 35.1 pg — AB (ref 26.0–34.0)
MCHC: 32.3 g/dL (ref 30.0–36.0)
MCV: 108.5 fL — ABNORMAL HIGH (ref 78.0–100.0)
PLATELETS: 396 10*3/uL (ref 150–400)
RBC: 3.99 MIL/uL (ref 3.87–5.11)
RDW: 12.8 % (ref 11.5–15.5)
WBC: 13.4 10*3/uL — ABNORMAL HIGH (ref 4.0–10.5)

## 2014-10-16 LAB — DIFFERENTIAL
Basophils Absolute: 0 10*3/uL (ref 0.0–0.1)
Basophils Relative: 0 % (ref 0–1)
EOS PCT: 1 % (ref 0–5)
Eosinophils Absolute: 0.1 10*3/uL (ref 0.0–0.7)
LYMPHS ABS: 1.7 10*3/uL (ref 0.7–4.0)
Lymphocytes Relative: 13 % (ref 12–46)
MONOS PCT: 5 % (ref 3–12)
Monocytes Absolute: 0.7 10*3/uL (ref 0.1–1.0)
NEUTROS ABS: 10.9 10*3/uL — AB (ref 1.7–7.7)
Neutrophils Relative %: 81 % — ABNORMAL HIGH (ref 43–77)

## 2014-10-16 LAB — COMPREHENSIVE METABOLIC PANEL
ALK PHOS: 70 U/L (ref 38–126)
ALT: 20 U/L (ref 14–54)
AST: 35 U/L (ref 15–41)
Albumin: 4 g/dL (ref 3.5–5.0)
Anion gap: 11 (ref 5–15)
BILIRUBIN TOTAL: 1.2 mg/dL (ref 0.3–1.2)
BUN: 17 mg/dL (ref 6–20)
CALCIUM: 9.4 mg/dL (ref 8.9–10.3)
CO2: 26 mmol/L (ref 22–32)
CREATININE: 0.82 mg/dL (ref 0.44–1.00)
Chloride: 106 mmol/L (ref 101–111)
GFR calc non Af Amer: >60 mL/min (ref >60)
GLUCOSE: 95 mg/dL (ref 65–99)
Potassium: 3.6 mmol/L (ref 3.5–5.1)
SODIUM: 143 mmol/L (ref 135–145)
TOTAL PROTEIN: 7.4 g/dL (ref 6.5–8.1)

## 2014-10-16 LAB — APTT: aPTT: 29 seconds (ref 24–37)

## 2014-10-16 LAB — TSH: TSH: 1.674 u[IU]/mL (ref 0.350–4.500)

## 2014-10-16 LAB — PROTIME-INR
INR: 1.32 (ref 0.00–1.49)
PROTHROMBIN TIME: 16.5 s — AB (ref 11.6–15.2)

## 2014-10-16 SURGERY — EGD (ESOPHAGOGASTRODUODENOSCOPY)
Anesthesia: Monitor Anesthesia Care

## 2014-10-16 MED ORDER — ALBUTEROL SULFATE HFA 108 (90 BASE) MCG/ACT IN AERS: 2.0000 | INHALATION_SPRAY | Freq: Four times a day (QID) | RESPIRATORY_TRACT | Status: DC | PRN

## 2014-10-16 MED ORDER — LACTATED RINGERS IV SOLN
INTRAVENOUS | Status: DC
  Administered 2014-10-16: 1000 mL via INTRAVENOUS
  Administered 2014-10-16: 11:00:00 via INTRAVENOUS

## 2014-10-16 MED ORDER — NIFEDIPINE 10 MG PO CAPS
10.0000 mg | ORAL_CAPSULE | Freq: Three times a day (TID) | ORAL | Status: DC
  Administered 2014-10-17 (×2): 10 mg via ORAL
  Filled 2014-10-16 (×7): qty 1

## 2014-10-16 MED ORDER — ONDANSETRON HCL 4 MG/2ML IJ SOLN
4.0000 mg | Freq: Four times a day (QID) | INTRAMUSCULAR | Status: DC | PRN
  Administered 2014-10-16: 4 mg via INTRAVENOUS
  Filled 2014-10-16: qty 2

## 2014-10-16 MED ORDER — ONDANSETRON HCL 4 MG PO TABS: 4.0000 mg | ORAL_TABLET | Freq: Four times a day (QID) | ORAL | Status: DC | PRN

## 2014-10-16 MED ORDER — ONABOTULINUMTOXINA 100 UNITS IJ SOLR
100.0000 [IU] | Freq: Once | INTRAMUSCULAR | Status: AC
Start: 2014-10-16 — End: 2014-10-16
  Administered 2014-10-16: 4 mL via SUBMUCOSAL
  Filled 2014-10-16: qty 100

## 2014-10-16 MED ORDER — METOPROLOL TARTRATE 25 MG PO TABS
25.0000 mg | ORAL_TABLET | Freq: Two times a day (BID) | ORAL | Status: DC
  Administered 2014-10-16: 25 mg via ORAL
  Filled 2014-10-16 (×3): qty 1

## 2014-10-16 MED ORDER — PROPOFOL 10 MG/ML IV BOLUS
INTRAVENOUS | Status: AC
  Filled 2014-10-16: qty 20

## 2014-10-16 MED ORDER — MEMANTINE HCL ER 14 MG PO CP24
14.0000 mg | ORAL_CAPSULE | Freq: Every day | ORAL | Status: DC
  Administered 2014-10-17: 14 mg via ORAL
  Filled 2014-10-16 (×2): qty 1

## 2014-10-16 MED ORDER — DEXTROSE-NACL 5-0.45 % IV SOLN
INTRAVENOUS | Status: DC
  Administered 2014-10-16 (×2): via INTRAVENOUS

## 2014-10-16 MED ORDER — ATORVASTATIN CALCIUM 20 MG PO TABS
20.0000 mg | ORAL_TABLET | Freq: Every evening | ORAL | Status: DC
  Filled 2014-10-16 (×2): qty 1

## 2014-10-16 MED ORDER — PROPOFOL 10 MG/ML IV BOLUS
INTRAVENOUS | Status: DC | PRN
  Administered 2014-10-16: 20 mg via INTRAVENOUS
  Administered 2014-10-16: 50 mg via INTRAVENOUS

## 2014-10-16 MED ORDER — OCUVITE-LUTEIN PO CAPS
1.0000 | ORAL_CAPSULE | Freq: Two times a day (BID) | ORAL | Status: DC
  Administered 2014-10-16 – 2014-10-17 (×2): 1 via ORAL
  Filled 2014-10-16 (×4): qty 1

## 2014-10-16 MED ORDER — METOPROLOL TARTRATE 1 MG/ML IV SOLN
5.0000 mg | Freq: Four times a day (QID) | INTRAVENOUS | Status: DC
  Administered 2014-10-16 – 2014-10-17 (×4): 5 mg via INTRAVENOUS
  Filled 2014-10-16 (×8): qty 5

## 2014-10-16 MED ORDER — PANTOPRAZOLE SODIUM 40 MG PO TBEC
40.0000 mg | DELAYED_RELEASE_TABLET | Freq: Every day | ORAL | Status: DC
  Administered 2014-10-17: 40 mg via ORAL
  Filled 2014-10-16 (×2): qty 1

## 2014-10-16 MED ORDER — MAGNESIUM HYDROXIDE 400 MG/5ML PO SUSP: 30.0000 mL | Freq: Every day | ORAL | Status: DC | PRN

## 2014-10-16 MED ORDER — PROMETHAZINE HCL 25 MG PO TABS: 25.0000 mg | ORAL_TABLET | ORAL | Status: DC | PRN

## 2014-10-16 MED ORDER — SERTRALINE HCL 50 MG PO TABS
50.0000 mg | ORAL_TABLET | Freq: Every evening | ORAL | Status: DC
  Filled 2014-10-16: qty 1

## 2014-10-16 MED ORDER — HYDROXYUREA 500 MG PO CAPS: 1000.0000 mg | ORAL_CAPSULE | ORAL | Status: DC

## 2014-10-16 MED ORDER — NITROGLYCERIN 0.4 MG SL SUBL: 0.4000 mg | SUBLINGUAL_TABLET | SUBLINGUAL | Status: DC | PRN

## 2014-10-16 MED ORDER — DOCUSATE SODIUM 100 MG PO CAPS
100.0000 mg | ORAL_CAPSULE | Freq: Two times a day (BID) | ORAL | Status: DC
  Administered 2014-10-16 – 2014-10-17 (×3): 100 mg via ORAL
  Filled 2014-10-16 (×6): qty 1

## 2014-10-16 MED ORDER — FENTANYL CITRATE (PF) 100 MCG/2ML IJ SOLN: 25.0000 ug | INTRAMUSCULAR | Status: DC | PRN

## 2014-10-16 MED ORDER — HYDROCODONE-ACETAMINOPHEN 10-325 MG PO TABS
1.0000 | ORAL_TABLET | Freq: Four times a day (QID) | ORAL | Status: DC
  Administered 2014-10-16: 1 via ORAL
  Filled 2014-10-16 (×2): qty 1

## 2014-10-16 MED ORDER — MONTELUKAST SODIUM 10 MG PO TABS
10.0000 mg | ORAL_TABLET | Freq: Every day | ORAL | Status: DC
  Administered 2014-10-16 (×2): 10 mg via ORAL
  Filled 2014-10-16 (×3): qty 1

## 2014-10-16 MED ORDER — ALBUTEROL SULFATE (2.5 MG/3ML) 0.083% IN NEBU
2.5000 mg | INHALATION_SOLUTION | Freq: Four times a day (QID) | RESPIRATORY_TRACT | Status: DC | PRN
  Filled 2014-10-16: qty 3

## 2014-10-16 MED ORDER — LORAZEPAM 0.5 MG PO TABS: 0.5000 mg | ORAL_TABLET | ORAL | Status: DC | PRN

## 2014-10-16 MED ORDER — ONDANSETRON HCL 4 MG/2ML IJ SOLN: 4.0000 mg | Freq: Once | INTRAMUSCULAR | Status: DC | PRN

## 2014-10-16 MED ORDER — SERTRALINE HCL 25 MG PO TABS
25.0000 mg | ORAL_TABLET | Freq: Every day | ORAL | Status: DC
  Administered 2014-10-16: 25 mg via ORAL
  Filled 2014-10-16 (×2): qty 1

## 2014-10-16 MED ORDER — HYDROXYUREA 500 MG PO CAPS
500.0000 mg | ORAL_CAPSULE | ORAL | Status: DC
  Filled 2014-10-16: qty 1

## 2014-10-16 MED ORDER — FLUTICASONE FUROATE-VILANTEROL 100-25 MCG/INH IN AEPB: 1.0000 | INHALATION_SPRAY | Freq: Every day | RESPIRATORY_TRACT | Status: DC

## 2014-10-16 NOTE — Anesthesia Postprocedure Evaluation (Signed)
  Anesthesia Post-op Note  Patient: Sherri Crawford  Procedure(s) Performed: Procedure(s) (LRB): ESOPHAGOGASTRODUODENOSCOPY (EGD) (N/A) BOTOX INJECTION (N/A)  Patient Location: PACU  Anesthesia Type: MAC  Level of Consciousness: awake and alert   Airway and Oxygen Therapy: Patient Spontanous Breathing  Post-op Pain: mild  Post-op Assessment: Post-op Vital signs reviewed, Patient's Cardiovascular Status Stable, Respiratory Function Stable, Patent Airway and No signs of Nausea or vomiting  Last Vitals:  Filed Vitals:   10/16/14 1400  BP: 154/58  Pulse: 79  Temp: 37.3 C  Resp: 18    Post-op Vital Signs: stable   Complications: No apparent anesthesia complications

## 2014-10-16 NOTE — Progress Notes (Signed)
     [facility] Progress Note  Subjective:   81 yo female admitted from office as direct admit yesterday. Pt has hax achalasia, COPD, HTN, GERD. Has had difficulty swallowing or keeping anything down for 4 days. Nurses report she has been unable to swallow pills or even water since admit.     Objective:  Vital signs in last 24 hours: Temp:  [98.3 F (36.8 C)-99.8 F (37.7 C)] 98.4 F (36.9 C) (08/19 0556) Pulse Rate:  [63-84] 63 (08/19 0556) Resp:  [16] 16 (08/19 0556) BP: (130-172)/(62-72) 172/62 mmHg (08/19 0556) SpO2:  [96 %-99 %] 96 % (08/19 0556) Weight:  [105 lb 2 oz (47.684 kg)-105 lb 5 oz (47.769 kg)] 105 lb 5 oz (47.769 kg) (08/19 0500)   General:   Alert,  Well-developed, in NAD Heart:  Regular rate and rhythm; no murmurs Pulm;lungs clear Abdomen:  Soft, nontender and nondistended. Normal bowel sounds, without guarding, and without rebound.   Extremities:  Without edema. Neurologic:Alert, oriented to name  Intake/Output from previous day: 08/18 0701 - 08/19 0700 In: 237.5 [I.V.:237.5] Out: 8 [Urine:4; Stool:4] Intake/Output this shift:    Lab Results:  Recent Labs  10/16/14 0154  WBC 13.4*  HGB 14.0  HCT 43.3  PLT 396   BMET  Recent Labs  10/16/14 0154  NA 143  K 3.6  CL 106  CO2 26  GLUCOSE 95  BUN 17  CREATININE 0.82  CALCIUM 9.4   LFT  Recent Labs  10/16/14 0154  PROT 7.4  ALBUMIN 4.0  AST 35  ALT 20  ALKPHOS 70  BILITOT 1.2   PT/INR  Recent Labs  10/16/14 0154  LABPROT 16.5*  INR 1.32    ASSESSMENT/PLAN:    32. 81 year old female with history of achalasia/food impaction / Candida esophagitis. Patient did well for one year following EGD with Botox injection but now with recurrent dysphagia. Patient unable to swallow any of her medications or fluids for 3 days now. Rule out recurrent candida esophagitis and/or food impaction given history of achalasia. Patient needs IV fluids and her medications switched to IV  form until resolution of dysphagia. Has been off xarelto x 2 days.Tentatively for EGD with botox later today.  2. Multiple medical problems as listed in PMH. Patient on Xarelto for history of DVT but unable to tolerate any of her PO meds for two days per son.     LOS: 1 day   Hvozdovic, Deloris Ping 10/16/2014, Pager [phone]  GI Attending Note   Chart was reviewed and patient was examined. X-rays and lab were reviewed.    I agree with management and plans.  Plan EGD with Botox injection of the LES unless other etiologies for severe dysphagia are determined.  [name] Salaam. Deatra Ina, M.D., [facility] Cell [phone]

## 2014-10-16 NOTE — Transfer of Care (Signed)
Immediate Anesthesia Transfer of Care Note  Patient: Sherri Crawford  Procedure(s) Performed: Procedure(s) with comments: ESOPHAGOGASTRODUODENOSCOPY (EGD) (N/A) - with botox BOTOX INJECTION (N/A)  Patient Location: PACU  Anesthesia Type:MAC  Level of Consciousness: awake, alert  and oriented  Airway & Oxygen Therapy: Patient Spontanous Breathing and Patient connected to face mask oxygen  Post-op Assessment: Report given to RN and Post -op Vital signs reviewed and stable  Post vital signs: Reviewed and stable  Last Vitals:  Filed Vitals:   10/16/14 1028  BP: 203/72  Pulse: 79  Temp: 37.4 C  Resp: 25    Complications: No apparent anesthesia complications

## 2014-10-16 NOTE — Op Note (Signed)
[facility] [place], [address]   ENDOSCOPY PROCEDURE REPORT  PATIENT: Umi, Mainor  MR#: [id number] BIRTHDATE: [birth year] , 85  yrs. old GENDER: female ENDOSCOPIST: Inda Castle, MD REFERRED BY: PROCEDURE DATE:  10/16/2014 PROCEDURE:  EGD w/ directed submucosal injection(s), any substance  ASA CLASS:     Class II INDICATIONS:  dysphagia.  history of achalasia MEDICATIONS: Monitored anesthesia care TOPICAL ANESTHETIC:  DESCRIPTION OF PROCEDURE: After the risks benefits and alternatives of the procedure were thoroughly explained, informed consent was obtained.  The Pentax Gastroscope [id number] endoscope was introduced through the mouth and advanced to the second portion of the duodenum , Without limitations.  The instrument was slowly withdrawn as the mucosa was fully examined.    STOMACH: Multiple smooth sessile polyps were found in the gastric fundus.   There was a large amount of food throughout the esophagus.  Parts were pushed through into the stomach.  At the GE junction Botox was injected into each quadrant.  1 cc (25 units) was injected submucosally into each quadrant at the GE junction. Esophageal mucosa was diffusely inflamed.         The scope was then withdrawn from the patient and the procedure completed.  COMPLICATIONS: There were no immediate complications.  ENDOSCOPIC IMPRESSION: 1.  partial food impaction 2.  Esophagitis 3.  Status post Botox injection  RECOMMENDATIONS: begin clear liquids and advance as tolerated  REPEAT EXAM:  eSigned:  Inda Castle, MD 10/16/2014 11:07 AM    CC:

## 2014-10-16 NOTE — Anesthesia Preprocedure Evaluation (Addendum)
Anesthesia Evaluation  Patient identified by MRN, date of birth, ID band Patient awake    Reviewed: Allergy & Precautions, NPO status , Patient's Chart, lab work & pertinent test results, reviewed documented beta blocker date and time   Airway Mallampati: II  TM Distance: >3 FB Neck ROM: Full    Dental no notable dental hx. (+) Teeth Intact   Pulmonary asthma , COPD breath sounds clear to auscultation  Pulmonary exam normal       Cardiovascular hypertension, Pt. on medications and Pt. on home beta blockers + CAD, + Peripheral Vascular Disease and DVT negative cardio ROS Normal cardiovascular examRhythm:Regular Rate:Normal     Neuro/Psych PSYCHIATRIC DISORDERS Anxiety Depression TIA   GI/Hepatic Neg liver ROS, GERD-  Medicated,  Endo/Other  negative endocrine ROS  Renal/GU negative Renal ROS     Musculoskeletal  (+) Arthritis -,   Abdominal   Peds  Hematology  (+) anemia ,   Anesthesia Other Findings Day of surgery medications reviewed with the patient.  Reproductive/Obstetrics                           Anesthesia Physical Anesthesia Plan  ASA: III  Anesthesia Plan: MAC   Post-op Pain Management:    Induction: Intravenous  Airway Management Planned: Nasal Cannula  Additional Equipment:   Intra-op Plan:   Post-operative Plan:   Informed Consent: I have reviewed the patients History and Physical, chart, labs and discussed the procedure including the risks, benefits and alternatives for the proposed anesthesia with the patient or authorized representative who has indicated his/her understanding and acceptance.   Dental advisory given  Plan Discussed with: CRNA and Anesthesiologist  Anesthesia Plan Comments: (Discussed risks/benefits/alternatives to MAC sedation including need for ventilatory support, hypotension, need for conversion to general anesthesia.  All patient questions  answered.  Patient wished to proceed.)        Anesthesia Quick Evaluation

## 2014-10-16 NOTE — Progress Notes (Signed)
Initial Nutrition Assessment  INTERVENTION:   Diet advancement per MD When diet advanced, recommend Boost Breeze po TID, each supplement provides 250 kcal and 9 grams of protein RD to continue to monitor  NUTRITION DIAGNOSIS:   Inadequate oral intake related to dysphagia Sherri Crawford) as evidenced by NPO status (/poor PO intake).  GOAL:   Patient will meet greater than or equal to 90% of their needs  MONITOR:   Diet advancement, Labs, Weight trends, Skin, I & O's  REASON FOR ASSESSMENT:   Malnutrition Screening Tool    ASSESSMENT:   81 y.o. female with history of achalasia of esophagus COPD HTN HLD GERD presents as a direct admit from the office for difficulty swallowing. The patient states that she has not been able to keep any food down for the last 3 days. Patient states that she has had difficulty with swallowing everything. She states that she has had issues with water. She states that she has had this problem in the past.  S/p 8/19: EGD w/ directed submucosal injection(s)  Pt asleep in room with no family present at bedside. Unable to gather history from pt at this time. Pt is also hard of hearing.  Per H&P, pt has been unable to eat, drink or take her meds x 3 days PTA. This is a recurrent problem for patient.  RD will monitor for diet advancement and improvement with dysphagia after procedure.   Per weight history documentation, pt's weight has remained stable since 2014.   Unable to perform nutrition focused physical exam at this time.  Labs reviewed.  Diet Order:  Diet NPO time specified  Skin:  Reviewed, no issues  Last BM:  8/17  Height:   Ht Readings from Last 1 Encounters:  10/15/14 4\' 9"  (1.448 m)    Weight:   Wt Readings from Last 1 Encounters:  10/16/14 105 lb 5 oz (47.769 kg)    Ideal Body Weight:  43.3 kg  BMI:  Body mass index is 22.78 kg/(m^2).  Estimated Nutritional Needs:   Kcal:  1100-1300  Protein:  70-80g  Fluid:   1.3L/day  EDUCATION NEEDS:   No education needs identified at this time  Clayton Bibles, MS, RD, LDN Pager: [phone] After Hours Pager: [phone]

## 2014-10-16 NOTE — Clinical Social Work Note (Addendum)
Patient to discharge back to [facility] when stable. Sherri Crawford at [facility] aware. If ready over the weekend, please contact weekend CSW, Rollene Fare (ph#: [phone]) to facilitate discharge.      Raynaldo Opitz, LCSW [facility] Clinical Social Worker cell #: [phone]   Clinical Social Work Assessment  Patient Details  Name: Sherri Crawford MRN: [id number] Date of Birth: Mar 03, 1920  Date of referral:  10/16/14               Reason for consult:  Facility Placement                Permission sought to share information with:  Facility Art therapist granted to share information::  Yes, Verbal Permission Granted  Name::        Agency::     Relationship::     Contact Information:     Housing/Transportation Living arrangements for the past 2 months:  [place] of Information:  Patient, Adult Children Patient Interpreter Needed:  None Criminal Activity/Legal Involvement Pertinent to Current Situation/Hospitalization:  No - Comment as needed Significant Relationships:  Adult Children Lives with:  Facility Resident Do you feel safe going back to the place where you live?  Yes Need for family participation in patient care:  No (Coment)  Care giving concerns:  CSW received consult that patient was admitted from [facility].    Social Worker assessment / plan:  CSW confirmed with patient's son, Sherri Crawford via phone (cell#: [phone]) that plan is for patient to return to [facility] at discharge.   Employment status:  Retired Nurse, adult PT Recommendations:  Not assessed at this time Information / Referral to community resources:     Patient/Family's Response to care:  Patient's son states that he has been very pleased with [facility] and hopes that she will be cleared by Sunday to return to SNF.   Patient/Family's Understanding of and  Emotional Response to Diagnosis, Current Treatment, and Prognosis:  Patient's son hopes that EDG will help patient with issues.   Emotional Assessment Appearance:  Appears stated age Attitude/Demeanor/Rapport:  Unable to Assess Affect (typically observed):    Orientation:  Oriented to Self, Oriented to Place, Oriented to  Time, Oriented to Situation Alcohol / Substance use:    Psych involvement (Current and /or in the community):     Discharge Needs  Concerns to be addressed:    Readmission within the last 30 days:    Current discharge risk:    Barriers to Discharge:      Standley Brooking, LCSW 10/16/2014, 3:00 PM

## 2014-10-16 NOTE — Progress Notes (Signed)
Sherri Crawford UQJ:[id number] DOB: 05-Jul-1920 DOA: 10/15/2014 PCP: Estill Dooms, MD  Brief narrative: 81 y/o ? Achalasia + candida esophagitis-egd 05/2013=food/debris COPD Essential thrombocythemia + DVT 2012-on Xarelto 15 daily--dose when necessary aspirin 81 daily Right femoral neck hemiarthroplasty 2014 Senile dementia on Namenda HTN CAD-myoview 2009 nl  Admit as direct admit from GI office with n/v    Subjective   Been better and wants to eat as is nothing by mouth No nausea no further vomiting No chest pain No other issue   Objective    Interim History:   Telemetry:    Objective: Filed Vitals:   10/16/14 1107 10/16/14 1110 10/16/14 1205 10/16/14 1400  BP: 220/75 220/75 174/60 154/58  Pulse: 73 73 72 79  Temp: 97.8 F (36.6 C)  99 F (37.2 C) 99.2 F (37.3 C)  TempSrc: Oral  Oral Oral  Resp: 16 22 20 18   Height:      Weight:      SpO2: 100% 100% 100% 93%    Intake/Output Summary (Last 24 hours) at 10/16/14 1535 Last data filed at 10/16/14 1400  Gross per 24 hour  Intake  937.5 ml  Output    458 ml  Net  479.5 ml    Exam:  General: EOMI NCAT pleasant euthymic Cardiovascular: S1-S2 no murmur rub or gallop Respiratory: Chest clinically clear Abdomen: Soft nontender nondistended no rebound no organomegaly Skin no lower extremity edema Neuro grossly intact  Data Reviewed: Basic Metabolic Panel:  Recent Labs Lab 10/16/14 0154  NA 143  K 3.6  CL 106  CO2 26  GLUCOSE 95  BUN 17  CREATININE 0.82  CALCIUM 9.4   Liver Function Tests:  Recent Labs Lab 10/16/14 0154  AST 35  ALT 20  ALKPHOS 70  BILITOT 1.2  PROT 7.4  ALBUMIN 4.0   No results for input(s): LIPASE, AMYLASE in the last 168 hours. No results for input(s): AMMONIA in the last 168 hours. CBC:  Recent Labs Lab 10/16/14 0154  WBC 13.4*  NEUTROABS 10.9*  HGB 14.0  HCT 43.3  MCV 108.5*  PLT 396   Cardiac Enzymes: No results for input(s): CKTOTAL, CKMB,  CKMBINDEX, TROPONINI in the last 168 hours. BNP: Invalid input(s): POCBNP CBG: No results for input(s): GLUCAP in the last 168 hours.  No results found for this or any previous visit (from the past 240 hour(s)).   Studies:              All Imaging reviewed and is as per above notation   Scheduled Meds: . atorvastatin  20 mg Oral QPM  . docusate sodium  100 mg Oral BID  . Fluticasone Furoate-Vilanterol  1 Dose Inhalation Daily  . HYDROcodone-acetaminophen  1 tablet Oral QID  . [START ON 10/19/2014] hydroxyurea  1,000 mg Oral Q Mon  . hydroxyurea  500 mg Oral Once per day on Sun Wed Fri  . memantine  14 mg Oral Daily  . metoprolol  5 mg Intravenous Q6H  . montelukast  10 mg Oral QHS  . multivitamin-lutein  1 capsule Oral BID  . NIFEdipine  10 mg Oral TID AC  . pantoprazole  40 mg Oral Daily  . sertraline  25 mg Oral Daily  . sertraline  50 mg Oral QPM   Continuous Infusions: . sodium chloride    . dextrose 5 % and 0.45% NaCl 50 mL/hr at 10/16/14 0115     Assessment/Plan:  Assessment/Plan Principal Problem:  Achalasia of esophagus Active  Problems:  HYPERCHOLESTEROLEMIA  Essential hypertension  COPD with asthma   1. Achalasia of Esophagus with Dysphagia - EGD performed = partial food impaction , esophagitis and patient had a Botox injection as well on 8/19 he can give her oral -Continue Low-dose IVF D5, quarter normal saline at 50 cc per hour until her dysphagia issue is resolved -Attempt soft diet as per GI instructions  2. Hypertension -will continue metoprolol 25 twice a day, nifedipine 10 mg 3 times a day -monitor pressures -when necessary IV metoprolol has been ordered as well  3. Hyperlipidemia -on lipitor  4. COPD with asthma -will continue with inhalers  5. GERD -on PPI continue pantoprazole 40 daily,   6. Anxiety disorder -Continue sertraline 25 daily -Continue Ativan 0.5 every 4 when necessary anxiety  7.  Mild  dementia -Continue Namenda 14 mg daily   Verneita Griffes, MD  [facility] Pager [phone] 10/16/2014, 3:35 PM    LOS: 1 day

## 2014-10-17 DIAGNOSIS — K220 Achalasia of cardia: Principal | ICD-10-CM

## 2014-10-17 LAB — HEMOGLOBIN A1C
Hgb A1c MFr Bld: 5.5 % (ref 4.8–5.6)
Mean Plasma Glucose: 111 mg/dL

## 2014-10-17 LAB — GLUCOSE, CAPILLARY: GLUCOSE-CAPILLARY: 117 mg/dL — AB (ref 65–99)

## 2014-10-17 MED ORDER — ACETAMINOPHEN 500 MG PO TABS
500.0000 mg | ORAL_TABLET | Freq: Four times a day (QID) | ORAL | Status: AC | PRN
  Administered 2014-10-17: 500 mg via ORAL
  Filled 2014-10-17: qty 1

## 2014-10-17 NOTE — Progress Notes (Signed)
Report called to Danielle to [employer] in [facility] . Discharged with son .

## 2014-10-17 NOTE — Progress Notes (Signed)
Witnessed [employer] return hydrocodone 10mg , acetaminophen 325mg  down to pharmacy.   Roselind Rily

## 2014-10-17 NOTE — Clinical Social Work Note (Signed)
CSW reviewed pt chart for discharge summary  CSW  Faxed and uploaded discharge summary, prepared packet and provided to RN  CSW met with pt and her son to confirm he will be transporting to SNF  No further CSW needs.  Dede Query, LCSW [place] Worker - Weekend Coverage cell #: [phone]

## 2014-10-17 NOTE — Discharge Summary (Signed)
Physician Discharge Summary  Sherri Crawford VZD:[id number] DOB: [birth year] DOA: 10/15/2014  PCP: Estill Dooms, MD  Admit date: 10/15/2014 Discharge date: 10/17/2014  Time spent: 35 minutes  Recommendations for Outpatient Follow-up:  1. Soft diet for 1-2 days  2. NEEDS cmET + CBC 1 week 3. Needs OP GI follow up for intermittent care of dysphagia   Discharge Diagnoses:  Principal Problem:   Achalasia of esophagus Active Problems:   HYPERCHOLESTEROLEMIA   Essential hypertension   COPD with asthma   Discharge Condition: fair  Diet recommendation: Soft diet for 1-2 days then regular diet  Filed Weights   10/15/14 1653 10/16/14 0500 10/17/14 0500  Weight: 47.684 kg (105 lb 2 oz) 47.769 kg (105 lb 5 oz) 48.535 kg (107 lb)    History of present illness:   81 y/o ? Achalasia + candida esophagitis-egd 05/2013=food/debris COPD Essential thrombocythemia + DVT 2012-on Xarelto 15 daily--dose when necessary aspirin 81 daily Right femoral neck hemiarthroplasty 2014 Senile dementia on Namenda HTN CAD-myoview 2009 nl  Hospital Course:   1. Achalasia of Esophagus with Dysphagia - EGD performed = partial food impaction , esophagitis and patient had a Botox injection as well on 8/19  -tolerated soft diet as per GI instructions -graduate slowly diet as OP  2. Hypertension -will continue metoprolol 25 twice a day, nifedipine 10 mg 3 times a day -monitor pressures as OP  3. Hyperlipidemia -on lipitor  4. COPD with asthma -will continue with inhalers  5. GERD -on PPI continue pantoprazole 40 daily,   6. Anxiety disorder -Continue sertraline 25 daily -Continue Ativan 0.5 every 4 when necessary anxiety  7. Mild dementia -Continue Namenda 14 mg daily   Procedures: ENDOSCOPIC IMPRESSION: 1. partial food impaction 2. Esophagitis 3. Status post Botox injection   (i.e. Studies not automatically included, echos, thoracentesis, etc; not  x-rays)  Consultations:  GI Dr. Deatra Ina  Discharge Exam: Filed Vitals:   10/17/14 0813  BP: 190/75  Pulse:   Temp:   Resp:     General: eomi ncat no issues HAH Cardiovascular: s1 s2 no m/r/g Respiratory: clear no added sound  Discharge Instructions   Discharge Instructions    Diet - low sodium heart healthy    Complete by:  As directed      Increase activity slowly    Complete by:  As directed           Current Discharge Medication List    CONTINUE these medications which have NOT CHANGED   Details  acetaminophen (TYLENOL) 325 MG tablet Take 650 mg by mouth every 4 (four) hours as needed.    albuterol (PROVENTIL HFA;VENTOLIN HFA) 108 (90 BASE) MCG/ACT inhaler Inhale 2 puffs into the lungs every 6 (six) hours as needed for wheezing or shortness of breath.    atorvastatin (LIPITOR) 20 MG tablet Take 20 mg by mouth every evening.    Associated Diagnoses: Essential thrombocythemia; DVT (deep venous thrombosis); Chronic anticoagulation    feeding supplement (RESOURCE BREEZE) LIQD Take 90 mLs by mouth 2 (two) times daily between meals.     Fluticasone Furoate-Vilanterol (BREO ELLIPTA) 100-25 MCG/INH AEPB Inhale 1 Dose into the lungs daily.    HYDROcodone-acetaminophen (NORCO) 10-325 MG per tablet Take 1 tablet by mouth 4 (four) times daily.    hydroxyurea (HYDREA) 500 MG capsule Take 500-1,000 mg by mouth See admin instructions. Take 1000 mg on Monday and 500 mg on Sunday, Wednesday and Friday    LORazepam (ATIVAN) 0.5 MG tablet Take 0.5  mg by mouth every 8 (eight) hours as needed for anxiety (and nausea).     Memantine HCl ER (NAMENDA XR) 14 MG CP24 Take 14 mg by mouth daily.    metoprolol tartrate (LOPRESSOR) 25 MG tablet Take 25 mg by mouth 2 (two) times daily.    montelukast (SINGULAIR) 10 MG tablet Take 10 mg by mouth at bedtime.    Multiple Vitamins-Minerals (PRESERVISION AREDS 2) CAPS Take 1 capsule by mouth 2 (two) times daily.    NIFEdipine (PROCARDIA) 10  MG capsule Take 1 capsule (10 mg total) by mouth 3 (three) times daily before meals. Qty: 90 capsule, Refills: 1    nitroGLYCERIN (NITROSTAT) 0.4 MG SL tablet Place 0.4 mg under the tongue every 5 (five) minutes as needed for chest pain.    omeprazole (PRILOSEC) 20 MG capsule Take 20 mg by mouth at bedtime.     promethazine (PHENERGAN) 25 MG suppository Place 25 mg rectally every 4 (four) hours as needed for nausea or vomiting.    promethazine (PHENERGAN) 25 MG tablet Take 25 mg by mouth every 4 (four) hours as needed for nausea or vomiting.    Rivaroxaban (XARELTO) 15 MG TABS tablet Take 15 mg by mouth daily.   Associated Diagnoses: Essential thrombocythemia; DVT (deep venous thrombosis), left    sertraline (ZOLOFT) 25 MG tablet Take 25 mg by mouth daily.      STOP taking these medications     Multiple Vitamins-Minerals (CERTAVITE SENIOR/ANTIOXIDANT PO)        Allergies  Allergen Reactions  . Sulfonamide Derivatives Other (See Comments)    Pt is unsure of reaction      The results of significant diagnostics from this hospitalization (including imaging, microbiology, ancillary and laboratory) are listed below for reference.    Significant Diagnostic Studies: No results found.  Microbiology: No results found for this or any previous visit (from the past 240 hour(s)).   Labs: Basic Metabolic Panel:  Recent Labs Lab 10/16/14 0154  NA 143  K 3.6  CL 106  CO2 26  GLUCOSE 95  BUN 17  CREATININE 0.82  CALCIUM 9.4   Liver Function Tests:  Recent Labs Lab 10/16/14 0154  AST 35  ALT 20  ALKPHOS 70  BILITOT 1.2  PROT 7.4  ALBUMIN 4.0   No results for input(s): LIPASE, AMYLASE in the last 168 hours. No results for input(s): AMMONIA in the last 168 hours. CBC:  Recent Labs Lab 10/16/14 0154  WBC 13.4*  NEUTROABS 10.9*  HGB 14.0  HCT 43.3  MCV 108.5*  PLT 396   Cardiac Enzymes: No results for input(s): CKTOTAL, CKMB, CKMBINDEX, TROPONINI in the last  168 hours. BNP: BNP (last 3 results) No results for input(s): BNP in the last 8760 hours.  ProBNP (last 3 results) No results for input(s): PROBNP in the last 8760 hours.  CBG:  Recent Labs Lab 10/17/14 0841  GLUCAP 117*       Signed:  Nita Sells  [facility] 10/17/2014, 9:55 AM

## 2014-10-17 NOTE — Progress Notes (Signed)
     [facility] Progress Note  Subjective:  S/P EGD 10/16/14: ENDOSCOPIC IMPRESSION: 1. partial food impaction 2. Esophagitis 3. Status post Botox injection RECOMMENDATIONS: begin clear liquids and advance as tolerated  Tol soft diet. Feels well. No chest pain. Objective:  Vital signs in last 24 hours: Temp:  [97.8 F (36.6 C)-99.2 F (37.3 C)] 98.1 F (36.7 C) (08/20 0453) Pulse Rate:  [66-79] 66 (08/20 0220) Resp:  [16-22] 16 (08/20 0453) BP: (134-220)/(49-75) 190/75 mmHg (08/20 0813) SpO2:  [93 %-100 %] 95 % (08/20 0453) Weight:  [107 lb (48.535 kg)] 107 lb (48.535 kg) (08/20 0500) Last BM Date: 10/14/14 General:   Alert,  Well-developed,    in NAD Heart:  Regular rate and rhythm; no murmurs Pulm;lungs clear Abdomen:  Soft, nontender and nondistended. Normal bowel sounds, without guarding, and without rebound.   Extremities:  Without edema.   Intake/Output from previous day: 08/19 0701 - 08/20 0700 In: 1540 [P.O.:240; I.V.:1300] Out: 950 [Urine:950] Intake/Output this shift: Total I/O In: 120 [P.O.:120] Out: 0   Lab Results:  Recent Labs  10/16/14 0154  WBC 13.4*  HGB 14.0  HCT 43.3  PLT 396   BMET  Recent Labs  10/16/14 0154  NA 143  K 3.6  CL 106  CO2 26  GLUCOSE 95  BUN 17  CREATININE 0.82  CALCIUM 9.4   LFT  Recent Labs  10/16/14 0154  PROT 7.4  ALBUMIN 4.0  AST 35  ALT 20  ALKPHOS 70  BILITOT 1.2   PT/INR  Recent Labs  10/16/14 0154  LABPROT 16.5*  INR 1.32       ASSESSMENT/PLAN:  88. 81 year old female with history of achalasia/food impaction / Candida esophagitis., admitted 8/18 with recurrent dysphagia.Pt had egd with injection of botox and removal of partial food impaction.Pt should be ready for discharge today. Should stay on soft diet for several days. Cont PPI.Will arrange f/u in GI office.    LOS: 2 days   Hvozdovic, Deloris Ping 10/17/2014, Pager [phone]  GI Attending Note  I have  personally taken an interval history, reviewed the chart, and examined the patient.  I agree with the extender's note, impression and recommendations.  [name] Salaam. Deatra Ina, MD, [facility] [phone]

## 2014-10-17 NOTE — Progress Notes (Signed)
10am hydrocodone 10mg , acetaminophen 325mg  refused. Returned to pharmacy after patient discharged from pyxis

## 2014-10-19 ENCOUNTER — Telehealth: Payer: Self-pay

## 2014-10-19 ENCOUNTER — Encounter (HOSPITAL_COMMUNITY): Payer: Self-pay | Admitting: Gastroenterology

## 2014-10-19 NOTE — Telephone Encounter (Signed)
Notified and agreed upon an appointment 11/17/14 at 3:30 pm.

## 2014-10-19 NOTE — Telephone Encounter (Signed)
-----   Message from Vita Barley Hvozdovic, PA-C sent at 10/17/2014 11:13 AM EDT ----- Eustaquio Maize, Pt s/p egd with boroc 8/19 by Dr Deatra Ina.  Needs f/u in office in about a month. Thanks, Cecille Rubin

## 2014-10-22 LAB — BASIC METABOLIC PANEL
BUN: 14 mg/dL (ref 4–21)
Creatinine: 0.6 mg/dL (ref 0.5–1.1)
Glucose: 86 mg/dL
Potassium: 3.9 mmol/L (ref 3.4–5.3)
Sodium: 142 mmol/L (ref 137–147)

## 2014-10-22 LAB — HEPATIC FUNCTION PANEL
ALK PHOS: 52 U/L (ref 25–125)
ALT: 12 U/L (ref 7–35)
AST: 16 U/L (ref 13–35)
BILIRUBIN, TOTAL: 0.4 mg/dL

## 2014-10-22 LAB — CBC AND DIFFERENTIAL
HEMATOCRIT: 36 % (ref 36–46)
HEMOGLOBIN: 12.1 g/dL (ref 12.0–16.0)
PLATELETS: 341 10*3/uL (ref 150–399)
WBC: 6.5 10^3/mL

## 2014-10-23 ENCOUNTER — Encounter: Payer: Self-pay | Admitting: Internal Medicine

## 2014-10-23 ENCOUNTER — Non-Acute Institutional Stay (SKILLED_NURSING_FACILITY): Payer: Medicare Other | Admitting: Internal Medicine

## 2014-10-23 DIAGNOSIS — F0390 Unspecified dementia without behavioral disturbance: Secondary | ICD-10-CM | POA: Diagnosis not present

## 2014-10-23 DIAGNOSIS — D638 Anemia in other chronic diseases classified elsewhere: Secondary | ICD-10-CM | POA: Diagnosis not present

## 2014-10-23 DIAGNOSIS — B3781 Candidal esophagitis: Secondary | ICD-10-CM | POA: Diagnosis not present

## 2014-10-23 DIAGNOSIS — I10 Essential (primary) hypertension: Secondary | ICD-10-CM

## 2014-10-23 DIAGNOSIS — D473 Essential (hemorrhagic) thrombocythemia: Secondary | ICD-10-CM | POA: Diagnosis not present

## 2014-10-23 DIAGNOSIS — R1319 Other dysphagia: Secondary | ICD-10-CM

## 2014-10-23 DIAGNOSIS — R1310 Dysphagia, unspecified: Secondary | ICD-10-CM

## 2014-10-23 DIAGNOSIS — R1314 Dysphagia, pharyngoesophageal phase: Secondary | ICD-10-CM

## 2014-10-23 DIAGNOSIS — K220 Achalasia of cardia: Secondary | ICD-10-CM | POA: Diagnosis not present

## 2014-10-23 NOTE — Progress Notes (Signed)
Patient ID: Sherri Crawford, female   DOB: 03/13/1920, 81 y.o.   MRN: [id number]    HISTORY AND PHYSICAL  Location:  [facility] Room Number: 24 Place of Service: SNF (31)   Extended Emergency Contact Information Primary Emergency Contact: Basques,Gene Address: [address]          [place], [place] [address] [place] Phone: [phone] Work Phone: [phone] Mobile Phone: [phone] Relation: Son Secondary Emergency Contact: [name], [place] Phone: [phone] Relation: Son  Advanced Directive information Does patient have an advance directive?: Yes, Type of Advance Directive: Healthcare Power of [place];Living will, Does patient want to make changes to advanced directive?: No - Patient declined  Chief Complaint  Patient presents with  . Readmit To SNF    Following hospitalization for partial food impaction    HPI:  Long-term resident of [facility] facility was hospitalized from 10/15/14 through 10/17/14 with a partial food impaction. She subsequently had EGD and Botox injection. She says she is swallowing well now and is tolerating liquids and solids when she eats.  There are multiple other chronic conditions which are listed below. They seem stable at the present time.  Past Medical History  Diagnosis Date  . Macular degeneration   . COPD (chronic obstructive pulmonary disease)   . Hypertension   . Coronary artery disease   . Hypercholesterolemia   . GERD (gastroesophageal reflux disease)   . Asthmatic bronchitis   . History of colonic polyps   . DJD (degenerative joint disease)   . Lumbar back pain   . Osteoporosis   . History of transient ischemic attack (TIA)   . Anxiety   . Essential thrombocythemia   . Diverticulosis 05/10/2012  . Osteoarthrosis, unspecified whether generalized or localized, unspecified site 05/09/2012  .  Memory loss 05/09/2012  . Abnormal weight gain 05/09/2012  . Phlebitis and thrombophlebitis of other deep vessels of lower extremities 07/10/2010  . Essential thrombocythemia 06/09/2009  . Depression   . Esophageal stricture   . Achalasia 10/15/2014    Past Surgical History  Procedure Laterality Date  . Appendectomy    . Cholecystectomy    . Tah and bso  08/21/1995  . Cataract extraction w/ intraocular lens  implant, bilateral    . Abdominal hysterectomy    . Hip arthroplasty Right 09/09/2012    Procedure: ARTHROPLASTY BIPOLAR HIP;  Surgeon: Mauri Pole, MD;  Location: [facility] ORS;  Service: Orthopedics;  Laterality: Right;  . Esophagogastroduodenoscopy (egd) with esophageal dilation N/A 11/25/2012    Procedure: ESOPHAGOGASTRODUODENOSCOPY (EGD) WITH ESOPHAGEAL DILATION;  Surgeon: Jerene Bears, MD;  Location: [facility] ENDOSCOPY;  Service: Gastroenterology;  Laterality: N/A;  . Esophagogastroduodenoscopy N/A 07/26/2013    Procedure: ESOPHAGOGASTRODUODENOSCOPY (EGD);  Surgeon: Irene Shipper, MD;  Location: [facility] ENDOSCOPY;  Service: Endoscopy;  Laterality: N/A;  . Esophageal manometry N/A 08/25/2013    Procedure: ESOPHAGEAL MANOMETRY (EM);  Surgeon: Jerene Bears, MD;  Location: [facility] ENDOSCOPY;  Service: Gastroenterology;  Laterality: N/A;  . Esophagogastroduodenoscopy (egd) with propofol N/A 09/18/2013    Procedure: ESOPHAGOGASTRODUODENOSCOPY (EGD) WITH PROPOFOL;  Surgeon: Lafayette Dragon, MD;  Location: [facility] ENDOSCOPY;  Service: Endoscopy;  Laterality: N/A;  . Botox injection N/A 09/18/2013    Procedure: BOTOX INJECTION;  Surgeon: Lafayette Dragon, MD;  Location: [facility] ENDOSCOPY;  Service: Endoscopy;  Laterality: N/A;  . Esophagogastroduodenoscopy  N/A 10/16/2014    Procedure: ESOPHAGOGASTRODUODENOSCOPY (EGD);  Surgeon: Inda Castle, MD;  Location: [facility] END[facility];  Service: Endoscopy;  Laterality: N/A;  with botox  . Botox injection N/A 10/16/2014    Procedure: BOTOX INJECTION;  Surgeon: Inda Castle, MD;  Location: [facility]  ENDOSCOPY;  Service: Endoscopy;  Laterality: N/A;    Patient Care Team: Estill Dooms, MD as PCP - General (Internal Medicine) Estill Dooms, MD as PCP - Internal Medicine (Internal Medicine) Annia Belt, MD as Consulting Physician (Hematology and Oncology) Suella Broad, MD as Consulting Physician (Physical Medicine and Rehabilitation) Noralee Space, MD (Pulmonary Disease) Moran Man [name] Rhunette Croft, NP as Nurse Practitioner (Nurse Practitioner)  Social History   Social History  . Marital Status: Widowed    Spouse Name: william  . Number of Children: 3  . Years of Education: N/A   Occupational History  . retired     Veterinary surgeon work  .     Social History Main Topics  . Smoking status: Never Smoker   . Smokeless tobacco: Never Used  . Alcohol Use: No  . Drug Use: No  . Sexual Activity: No   Other Topics Concern  . Not on file   Social History Narrative     reports that she has never smoked. She has never used smokeless tobacco. She reports that she does not drink alcohol or use illicit drugs.  Family History  Problem Relation Age of Onset  . Colon cancer Son 57  . Crohn's disease Son   . Cancer Maternal Aunt    Family Status  Relation Status Death Age  . Son Alive   . Son Alive   . Father      aneurysm  . Mother      stroke    Immunization History  Administered Date(s) Administered  . Influenza Split 11/16/2010, 12/13/2011  . Influenza Whole 11/27/2009  . Influenza-Unspecified 12/31/2013  . Pneumococcal Polysaccharide-23 02/28/1988    Allergies  Allergen Reactions  . Sulfonamide Derivatives Other (See Comments)    Pt is unsure of reaction    Medications: Patient's Medications  New Prescriptions   No medications on file  Previous Medications   ACETAMINOPHEN (TYLENOL) 325 MG TABLET    Take 650 mg by mouth every 4 (four) hours as needed.   ALBUTEROL (PROVENTIL HFA;VENTOLIN HFA) 108 (90 BASE) MCG/ACT INHALER    Inhale 2 puffs  into the lungs every 6 (six) hours as needed for wheezing or shortness of breath.   ATORVASTATIN (LIPITOR) 20 MG TABLET    Take 20 mg by mouth every evening.    FEEDING SUPPLEMENT (RESOURCE BREEZE) LIQD    Take 90 mLs by mouth 2 (two) times daily between meals.    FLUTICASONE FUROATE-VILANTEROL (BREO ELLIPTA) 100-25 MCG/INH AEPB    Inhale 1 Dose into the lungs daily.   HYDROCODONE-ACETAMINOPHEN (NORCO) 10-325 MG PER TABLET    Take 1 tablet by mouth 4 (four) times daily.   HYDROXYUREA (HYDREA) 500 MG CAPSULE    Take 500-1,000 mg by mouth See admin instructions. Take 1000 mg on Monday and 500 mg on Sunday, Wednesday and Friday   LORAZEPAM (ATIVAN) 0.5 MG TABLET    Take 0.5 mg by mouth every 8 (eight) hours as needed for anxiety (and nausea).    MEMANTINE HCL ER (NAMENDA XR) 14 MG CP24    Take 14 mg by mouth daily.   METOPROLOL TARTRATE (LOPRESSOR) 25 MG TABLET    Take 25  mg by mouth 2 (two) times daily.   MONTELUKAST (SINGULAIR) 10 MG TABLET    Take 10 mg by mouth at bedtime.   MULTIPLE VITAMINS-MINERALS (PRESERVISION AREDS 2) CAPS    Take 1 capsule by mouth 2 (two) times daily.   NIFEDIPINE (PROCARDIA) 10 MG CAPSULE    Take 1 capsule (10 mg total) by mouth 3 (three) times daily before meals.   NITROGLYCERIN (NITROSTAT) 0.4 MG SL TABLET    Place 0.4 mg under the tongue every 5 (five) minutes as needed for chest pain.   OMEPRAZOLE (PRILOSEC) 20 MG CAPSULE    Take 20 mg by mouth at bedtime.    PROMETHAZINE (PHENERGAN) 25 MG SUPPOSITORY    Place 25 mg rectally every 4 (four) hours as needed for nausea or vomiting.   PROMETHAZINE (PHENERGAN) 25 MG TABLET    Take 25 mg by mouth every 4 (four) hours as needed for nausea or vomiting.   RIVAROXABAN (XARELTO) 15 MG TABS TABLET    Take 15 mg by mouth daily.   SERTRALINE (ZOLOFT) 25 MG TABLET    Take 25 mg by mouth daily.  Modified Medications   No medications on file  Discontinued Medications   No medications on file    Review of Systems    Constitutional: Positive for fever. Negative for chills and diaphoresis.  HENT: Positive for hearing loss. Negative for congestion, ear discharge, ear pain, nosebleeds, sore throat and tinnitus.   Eyes: Negative for photophobia, pain, discharge and redness.  Respiratory: Negative for cough, shortness of breath, wheezing and stridor.   Cardiovascular: Negative for chest pain, palpitations and leg swelling.  Gastrointestinal: Negative for nausea, vomiting, abdominal pain, diarrhea, constipation and blood in stool.  Endocrine: Negative for polydipsia.  Genitourinary: Positive for frequency. Negative for dysuria, urgency, hematuria and flank pain.  Musculoskeletal: Positive for back pain and gait problem. Negative for myalgias and neck pain.       Lower extremity weakness. Rods wheelchair.  Skin: Negative for rash.  Allergic/Immunologic: Negative for environmental allergies.  Neurological: Negative for dizziness, tremors, seizures, weakness and headaches.  Hematological: Negative.  Does not bruise/bleed easily.  Psychiatric/Behavioral: Negative for suicidal ideas and hallucinations. The patient is nervous/anxious.     Filed Vitals:   10/23/14 1728  BP: 120/62  Pulse: 76  Temp: 98.7 F (37.1 C)  Resp: 18  Height: _0  (1.549 m)  Weight: 109 lb 3.2 oz (49.533 kg)  SpO2: 97%   Body mass index is 20.64 kg/(m^2).  Physical Exam  Constitutional: She is oriented to person, place, and time. She appears well-developed and well-nourished. No distress.  HENT:  Head: Normocephalic and atraumatic.  Eyes: Conjunctivae and EOM are normal. Pupils are equal, round, and reactive to light.  Neck: Normal range of motion. Neck supple. No JVD present. No thyromegaly present.  Cardiovascular: Normal rate, regular rhythm and normal heart sounds.   Pulmonary/Chest: Effort normal. She has decreased breath sounds. She has no wheezes. She has no rales.  Decreased breath sounds Bilat. Lungs.   Abdominal:  Soft. Bowel sounds are normal. There is no tenderness.  Musculoskeletal: Normal range of motion. She exhibits no edema or tenderness.  Lower back-baseline: pain at end of day occasionally.   Lymphadenopathy:    She has no cervical adenopathy.  Neurological: She is alert and oriented to person, place, and time. She has normal reflexes. No cranial nerve deficit. She exhibits normal muscle tone. Coordination normal.  Skin: Skin is warm and dry. No rash  noted. She is not diaphoretic. No erythema (LLE).  The right hip surgical incision healed.   Psychiatric: She has a normal mood and affect. Her affect is not angry, not blunt, not labile and not inappropriate. Her speech is not delayed, not tangential and not slurred. She is hyperactive and slowed. She is not agitated, not aggressive, not withdrawn and not combative. Thought content is not paranoid and not delusional. Cognition and memory are impaired. She does not express impulsivity or inappropriate judgment. She exhibits abnormal recent memory.     Labs reviewed: Lab Summary Latest Ref Rng 10/16/2014 07/14/2014 03/17/2014 01/15/2014 11/18/2013  Hemoglobin 12.0 - 15.0 g/dL 14.0 12.9 13.9 11.8(A) 11.0(A)  Hematocrit 36.0 - 46.0 % 43.3 37 41 36 37  White count 4.0 - 10.5 K/uL 13.4(H) 8.2 7.9 6.7 6.2  Platelet count 150 - 400 K/uL 396 276 349 276 358  Sodium 135 - 145 mmol/L 143 (None) (None) (None) (None)  Potassium 3.5 - 5.1 mmol/L 3.6 (None) (None) (None) (None)  Calcium 8.9 - 10.3 mg/dL 9.4 (None) (None) (None) (None)  Phosphorus - (None) (None) (None) (None) (None)  Creatinine 0.44 - 1.00 mg/dL 0.82 (None) (None) (None) (None)  AST 15 - 41 U/L 35 (None) (None) (None) (None)  Alk Phos 38 - 126 U/L 70 (None) (None) (None) (None)  Bilirubin 0.3 - 1.2 mg/dL 1.2 (None) (None) (None) (None)  Glucose 65 - 99 mg/dL 95 (None) (None) (None) (None)  Cholesterol - (None) (None) (None) (None) (None)  HDL cholesterol - (None) (None) (None) (None) (None)    Triglycerides - (None) (None) (None) (None) (None)  LDL Direct - (None) (None) (None) (None) (None)  LDL Calc - (None) (None) (None) (None) (None)  Total protein 6.5 - 8.1 g/dL 7.4 (None) (None) (None) (None)  Albumin 3.5 - 5.0 g/dL 4.0 (None) (None) (None) (None)    Assessment/Plan   1. Achalasia Swallowing well at present time. Status post EGD by Dr. Erskine Emery with Botox injection as well as removal of partial food impaction on 10/16/2014  2. Anemia of chronic disease Appears to be cleared up at the present time  3. Candida esophagitis History of esophageal Candida in 2015, but none was noted on the latest EGD.  4. Dementia, without behavioral disturbance Mild to moderate and stable  5. Esophageal dysphagia Secondary to achalasia  6. Essential hypertension, benign Controlled  7. Essential thrombocythemia Resolved

## 2014-10-26 ENCOUNTER — Encounter: Payer: Self-pay | Admitting: Nurse Practitioner

## 2014-10-26 ENCOUNTER — Non-Acute Institutional Stay (SKILLED_NURSING_FACILITY): Payer: Medicare Other | Admitting: Nurse Practitioner

## 2014-10-26 DIAGNOSIS — M5440 Lumbago with sciatica, unspecified side: Secondary | ICD-10-CM | POA: Diagnosis not present

## 2014-10-26 DIAGNOSIS — F329 Major depressive disorder, single episode, unspecified: Secondary | ICD-10-CM

## 2014-10-26 DIAGNOSIS — F32A Depression, unspecified: Secondary | ICD-10-CM

## 2014-10-26 DIAGNOSIS — J4489 Other specified chronic obstructive pulmonary disease: Secondary | ICD-10-CM

## 2014-10-26 DIAGNOSIS — F0390 Unspecified dementia without behavioral disturbance: Secondary | ICD-10-CM

## 2014-10-26 DIAGNOSIS — F418 Other specified anxiety disorders: Secondary | ICD-10-CM

## 2014-10-26 DIAGNOSIS — J449 Chronic obstructive pulmonary disease, unspecified: Secondary | ICD-10-CM | POA: Diagnosis not present

## 2014-10-26 DIAGNOSIS — K219 Gastro-esophageal reflux disease without esophagitis: Secondary | ICD-10-CM | POA: Diagnosis not present

## 2014-10-26 DIAGNOSIS — I10 Essential (primary) hypertension: Secondary | ICD-10-CM

## 2014-10-26 DIAGNOSIS — F419 Anxiety disorder, unspecified: Secondary | ICD-10-CM

## 2014-10-26 NOTE — Assessment & Plan Note (Signed)
Long standing spondylosis, prn inj and narcotics for pain control, w/c for mobility except a few steps with walker sometimes.

## 2014-10-26 NOTE — Progress Notes (Signed)
Patient ID: Sherri Crawford, female   DOB: 06/22/1920, 81 y.o.   MRN: [id number]  Location:  SNF [facility] Provider:  Marlana Latus NP  Code Status:  DNR Goals of care: Advanced Directive information    Chief Complaint  Patient presents with  . Medical Management of Chronic Issues     HPI: Patient is a 81 y.o. female seen in the SNF at [facility] today for evaluation of  S/p esophageal achalasia dilatation, hx of achalasia, takes PPI and CCB for maintenance, chronic back pain, prn inj, narcotics, now w/c for mobility except a few steps for bathroom trips, mood has been stabilized, tolerated GDR Sertraline down to 25mg  since 10/12/14, stable COPD, last flare up was 07/2014, blood pressure has been well controlled.   Review of Systems:  Review of Systems  Constitutional: Negative for fever, chills and diaphoresis.  HENT: Positive for hearing loss. Negative for congestion, ear discharge, ear pain, nosebleeds, sore throat and tinnitus.   Eyes: Negative for photophobia, pain, discharge and redness.  Respiratory: Negative for cough, shortness of breath, wheezing and stridor.   Cardiovascular: Negative for chest pain, palpitations and leg swelling.  Gastrointestinal: Negative for nausea, vomiting, abdominal pain, diarrhea, constipation and blood in stool.  Genitourinary: Positive for frequency. Negative for dysuria, urgency, hematuria and flank pain.  Musculoskeletal: Positive for back pain. Negative for myalgias and neck pain.       Lower extremity weakness. Rods wheelchair.  Skin: Negative for rash.  Neurological: Negative for dizziness, tremors, seizures, weakness and headaches.  Endo/Heme/Allergies: Negative for environmental allergies and polydipsia. Does not bruise/bleed easily.  Psychiatric/Behavioral: Positive for memory loss. Negative for suicidal ideas and hallucinations. The patient is not nervous/anxious.     Past Medical History  Diagnosis Date  . Macular degeneration   .  COPD (chronic obstructive pulmonary disease)   . Hypertension   . Coronary artery disease   . Hypercholesterolemia   . GERD (gastroesophageal reflux disease)   . Asthmatic bronchitis   . History of colonic polyps   . DJD (degenerative joint disease)   . Lumbar back pain   . Osteoporosis   . History of transient ischemic attack (TIA)   . Anxiety   . Essential thrombocythemia   . Diverticulosis 05/10/2012  . Osteoarthrosis, unspecified whether generalized or localized, unspecified site 05/09/2012  . Memory loss 05/09/2012  . Abnormal weight gain 05/09/2012  . Phlebitis and thrombophlebitis of other deep vessels of lower extremities 07/10/2010  . Essential thrombocythemia 06/09/2009  . Depression   . Esophageal stricture   . Achalasia 10/15/2014    Patient Active Problem List   Diagnosis Date Noted  . Achalasia 10/15/2014  . Essential hypertension, benign 10/30/2013  . Achalasia of esophagus 09/18/2013  . Loss of weight 08/27/2013  . S/P balloon dilatation of esophageal stricture 07/28/2013  . Candida esophagitis 07/28/2013  . Esophageal dysmotility 07/26/2013  . Foreign body in esophagus 07/26/2013  . Nonspecific (abnormal) findings on radiological and other examination of gastrointestinal tract 07/26/2013  . Fall 06/25/2013  . Protein-calorie malnutrition, severe 11/24/2012  . Dry skin dermatitis 11/18/2012  . Dementia 09/16/2012  . Hip fracture 09/08/2012  . Deep vein thrombosis of left lower extremity 07/19/2012  . Anxiety and depression 03/12/2012  . DVT (deep venous thrombosis) 07/13/2010  . Edema of leg 07/01/2010  . Left leg DVT 07/01/2010  . Esophageal dysphagia 05/27/2010  . SYNCOPE 07/30/2009  . MACULAR DEGENERATION 01/26/2008  . CORONARY ARTERY DISEASE 03/31/2007  . COLONIC POLYPS  03/12/2007  . HYPERCHOLESTEROLEMIA 03/12/2007  . 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 GERD 03/12/2007  . DEGENERATIVE JOINT DISEASE 03/12/2007  . BACK PAIN, LUMBAR 03/12/2007  .  Osteoporosis 03/12/2007  . TRANSIENT ISCHEMIC ATTACKS, HX OF 03/12/2007    Allergies  Allergen Reactions  . Sulfonamide Derivatives Other (See Comments)    Pt is unsure of reaction    Medications: Patient's Medications  New Prescriptions   No medications on file  Previous Medications   ACETAMINOPHEN (TYLENOL) 325 MG TABLET    Take 650 mg by mouth every 4 (four) hours as needed.   ALBUTEROL (PROVENTIL HFA;VENTOLIN HFA) 108 (90 BASE) MCG/ACT INHALER    Inhale 2 puffs into the lungs every 6 (six) hours as needed for wheezing or shortness of breath.   ATORVASTATIN (LIPITOR) 20 MG TABLET    Take 20 mg by mouth every evening.    FEEDING SUPPLEMENT (RESOURCE BREEZE) LIQD    Take 90 mLs by mouth 2 (two) times daily between meals.    FLUTICASONE FUROATE-VILANTEROL (BREO ELLIPTA) 100-25 MCG/INH AEPB    Inhale 1 Dose into the lungs daily.   HYDROCODONE-ACETAMINOPHEN (NORCO) 10-325 MG PER TABLET    Take 1 tablet by mouth 4 (four) times daily.   HYDROXYUREA (HYDREA) 500 MG CAPSULE    Take 500-1,000 mg by mouth See admin instructions. Take 1000 mg on Monday and 500 mg on Sunday, Wednesday and Friday   LORAZEPAM (ATIVAN) 0.5 MG TABLET    Take 0.5 mg by mouth every 8 (eight) hours as needed for anxiety (and nausea).    MEMANTINE HCL ER (NAMENDA XR) 14 MG CP24    Take 14 mg by mouth daily.   METOPROLOL TARTRATE (LOPRESSOR) 25 MG TABLET    Take 25 mg by mouth 2 (two) times daily.   MONTELUKAST (SINGULAIR) 10 MG TABLET    Take 10 mg by mouth at bedtime.   MULTIPLE VITAMINS-MINERALS (PRESERVISION AREDS 2) CAPS    Take 1 capsule by mouth 2 (two) times daily.   NIFEDIPINE (PROCARDIA) 10 MG CAPSULE    Take 1 capsule (10 mg total) by mouth 3 (three) times daily before meals.   NITROGLYCERIN (NITROSTAT) 0.4 MG SL TABLET    Place 0.4 mg under the tongue every 5 (five) minutes as needed for chest pain.   OMEPRAZOLE (PRILOSEC) 20 MG CAPSULE    Take 20 mg by mouth at bedtime.    PROMETHAZINE (PHENERGAN) 25 MG  SUPPOSITORY    Place 25 mg rectally every 4 (four) hours as needed for nausea or vomiting.   PROMETHAZINE (PHENERGAN) 25 MG TABLET    Take 25 mg by mouth every 4 (four) hours as needed for nausea or vomiting.   RIVAROXABAN (XARELTO) 15 MG TABS TABLET    Take 15 mg by mouth daily.   SERTRALINE (ZOLOFT) 25 MG TABLET    Take 25 mg by mouth daily.  Modified Medications   No medications on file  Discontinued Medications   No medications on file    Physical Exam: Filed Vitals:   10/26/14 1339  BP: 122/64  Pulse: 76  Temp: 98.2 F (36.8 C)  TempSrc: Tympanic  Resp: 16   There is no weight on file to calculate BMI.  Physical Exam  Constitutional: She is oriented to person, place, and time. She appears well-developed and well-nourished. No distress.  HENT:  Head: Normocephalic and atraumatic.  Eyes: Conjunctivae and EOM are normal. Pupils are equal, round, and reactive to light.  Neck: Normal range  of motion. Neck supple. No JVD present. No thyromegaly present.  Cardiovascular: Normal rate, regular rhythm and normal heart sounds.   Pulmonary/Chest: Effort normal. She has decreased breath sounds. She has no wheezes. She has no rales.  Decreased breath sounds Bilat. Lungs.   Abdominal: Soft. Bowel sounds are normal. There is no tenderness.  Musculoskeletal: Normal range of motion. She exhibits no edema or tenderness.  Lower back-baseline: pain is well controlled with prn inj, narcotics, w/c   Lymphadenopathy:    She has no cervical adenopathy.  Neurological: She is alert and oriented to person, place, and time. She has normal reflexes. No cranial nerve deficit. She exhibits normal muscle tone. Coordination normal.  Skin: Skin is warm and dry. No rash noted. She is not diaphoretic. No erythema (LLE).  The right hip surgical scar  Psychiatric: She has a normal mood and affect. Her affect is not angry, not blunt, not labile and not inappropriate. Her speech is not delayed, not tangential and  not slurred. She is hyperactive and slowed. She is not agitated, not aggressive, not withdrawn and not combative. Thought content is not paranoid and not delusional. Cognition and memory are impaired. She does not express impulsivity or inappropriate judgment. She exhibits abnormal recent memory.    Labs reviewed: Basic Metabolic Panel:  Recent Labs  10/16/14 0154 10/22/14  NA 143 142  K 3.6 3.9  CL 106  --   CO2 26  --   GLUCOSE 95  --   BUN 17 14  CREATININE 0.82 0.6  CALCIUM 9.4  --     Liver Function Tests:  Recent Labs  10/16/14 0154 10/22/14  AST 35 16  ALT 20 12  ALKPHOS 70 52  BILITOT 1.2  --   PROT 7.4  --   ALBUMIN 4.0  --     CBC:  Recent Labs  07/14/14 10/16/14 0154 10/22/14  WBC 8.2 13.4* 6.5  NEUTROABS  --  10.9*  --   HGB 12.9 14.0 12.1  HCT 37 43.3 36  MCV  --  108.5*  --   PLT 276 396 341    Lab Results  Component Value Date   TSH 1.674 10/16/2014   Lab Results  Component Value Date   HGBA1C 5.5 10/16/2014   Lab Results  Component Value Date   CHOL 169 09/13/2011   HDL 62.80 09/13/2011   LDLCALC 79 09/13/2011   LDLDIRECT 152.3 06/04/2009   TRIG 137.0 09/13/2011   CHOLHDL 3 09/13/2011    Significant Diagnostic Results since last visit: none  Patient Care Team: Estill Dooms, MD as PCP - Internal Medicine (Internal Medicine) Annia Belt, MD as Consulting Physician (Hematology and Oncology) Suella Broad, MD as Consulting Physician (Physical Medicine and Rehabilitation) Noralee Space, MD (Pulmonary Disease) [facility] Dariusz Brase X, NP as Nurse Practitioner (Nurse Practitioner) Inda Castle, MD as Consulting Physician (Gastroenterology)  Assessment/Plan Problem List Items Addressed This Visit    COPD with asthma - Primary    dc'd Epipen prn and Immunotherapy per [facility] 01/17/13 --maintenance, takes Montelukast 10mg  daily, Breo Ellipta I daily inhaled, Ventolin HFA 2 puffs q6h prn.  -last flare up  07/2014      GERD    Stable. Omeprazole 20mg  daily. Prn Promethazine 12.5mg  q6h available to her. Complicated with achalasia, recent dilatation.       BACK PAIN, LUMBAR    Long standing spondylosis, prn inj and narcotics for pain control, w/c for mobility except  a few steps with walker sometimes.       Anxiety and depression    Stable, continue Sertraline 25mg  since 10/12/14(GDR) and Mirtazapine 7.5mg .       Dementia    SNF for care needs, continue Namenda 14mg  daily       Essential hypertension, benign    Controlled, continue Metoprolol 25mg  bid.           Family/ staff Communication: SNF for care, s/s of esophageal stricture.   Labs/tests ordered: CBC and CMP done 10/22/14  [facility] Ray Gervasi NP Geriatrics [facility] [address], [place] [address] On Call:  [phone] & follow prompts after 5pm & weekends Office Phone:  [phone] Office Fax:  [phone]

## 2014-10-26 NOTE — Assessment & Plan Note (Signed)
Stable, continue Sertraline 25mg  since 10/12/14(GDR) and Mirtazapine 7.5mg .

## 2014-10-26 NOTE — Assessment & Plan Note (Signed)
Stable. Omeprazole 20mg  daily. Prn Promethazine 12.5mg  q6h available to her. Complicated with achalasia, recent dilatation.

## 2014-10-26 NOTE — Assessment & Plan Note (Signed)
dc'd Epipen prn and Immunotherapy per [facility] 01/17/13 --maintenance, takes Montelukast 10mg  daily, Breo Ellipta I daily inhaled, Ventolin HFA 2 puffs q6h prn.  -last flare up 07/2014

## 2014-10-26 NOTE — Assessment & Plan Note (Signed)
Controlled, continue Metoprolol 25mg bid 

## 2014-10-26 NOTE — Assessment & Plan Note (Signed)
SNF for care needs, continue Namenda 14mg  daily

## 2014-11-05 LAB — CBC AND DIFFERENTIAL
HCT: 40 % (ref 36–46)
HEMOGLOBIN: 13.5 g/dL (ref 12.0–16.0)
Platelets: 338 10*3/uL (ref 150–399)
WBC: 6.9 10*3/mL

## 2014-11-09 ENCOUNTER — Other Ambulatory Visit: Payer: Self-pay | Admitting: Nurse Practitioner

## 2014-11-09 ENCOUNTER — Telehealth: Payer: Self-pay | Admitting: *Deleted

## 2014-11-09 DIAGNOSIS — I82402 Acute embolism and thrombosis of unspecified deep veins of left lower extremity: Secondary | ICD-10-CM

## 2014-11-09 NOTE — Telephone Encounter (Signed)
-----   Message from Sherri Belt, MD sent at 11/06/2014  2:05 PM EDT ----- This patient has not seen me in 2 years. She lives in nursing facility. Was supposed to be getting labs every month. This is the 1st CBC they have sent to me since 11/15. Please find out who the supervising MD is at the nursing facility. drG DrG ----- Message -----    From: Ebbie Latus, RN    Sent: 11/06/2014  10:42 AM      To: Sherri Belt, MD  Dr Darnell Level , received fax from [facility]; CBC/DIff on this pt. I will put it in your mailbox. Shadrack Brummitt

## 2014-11-09 NOTE — Telephone Encounter (Signed)
I called [facility] - Dr Art Nyoka Cowden is supervising MD.

## 2014-11-17 ENCOUNTER — Encounter: Payer: Self-pay | Admitting: Gastroenterology

## 2014-11-17 ENCOUNTER — Ambulatory Visit (INDEPENDENT_AMBULATORY_CARE_PROVIDER_SITE_OTHER): Payer: Medicare Other | Admitting: Gastroenterology

## 2014-11-17 VITALS — BP 100/50 | HR 72 | Wt 109.0 lb

## 2014-11-17 DIAGNOSIS — K220 Achalasia of cardia: Secondary | ICD-10-CM

## 2014-11-17 NOTE — Progress Notes (Signed)
      History of Present Illness:  Sherri Crawford has returned following hospitalization for dehydration secondary to inability to swallow.  She has achalasia and underwent disimpaction of food and Botox injection.  Since that time her swallowing has been excellent.  She is maintaining her nutrition and she has no GI complaints.    Review of Systems: Pertinent positive and negative review of systems were noted in the above HPI section. All other review of systems were otherwise negative.    Current Medications, Allergies, Past Medical History, Past Surgical History, Family History and Social History were reviewed in [place] record  Vital signs were reviewed in today's medical record. Physical Exam: General: Elderly female in no acute distress   See Assessment and Plan under Problem List

## 2014-11-17 NOTE — Patient Instructions (Signed)
Follow up as needed

## 2014-11-17 NOTE — Assessment & Plan Note (Signed)
Status post Botox injection in August, 2016 with excellent response.  Plan to repeat when necessary

## 2014-11-25 ENCOUNTER — Encounter: Payer: Self-pay | Admitting: Nurse Practitioner

## 2014-11-25 ENCOUNTER — Non-Acute Institutional Stay (SKILLED_NURSING_FACILITY): Payer: Medicare Other | Admitting: Nurse Practitioner

## 2014-11-25 DIAGNOSIS — K219 Gastro-esophageal reflux disease without esophagitis: Secondary | ICD-10-CM

## 2014-11-25 DIAGNOSIS — F418 Other specified anxiety disorders: Secondary | ICD-10-CM | POA: Diagnosis not present

## 2014-11-25 DIAGNOSIS — F0390 Unspecified dementia without behavioral disturbance: Secondary | ICD-10-CM

## 2014-11-25 DIAGNOSIS — M5440 Lumbago with sciatica, unspecified side: Secondary | ICD-10-CM

## 2014-11-25 DIAGNOSIS — F329 Major depressive disorder, single episode, unspecified: Secondary | ICD-10-CM

## 2014-11-25 DIAGNOSIS — I10 Essential (primary) hypertension: Secondary | ICD-10-CM

## 2014-11-25 DIAGNOSIS — K220 Achalasia of cardia: Secondary | ICD-10-CM | POA: Diagnosis not present

## 2014-11-25 DIAGNOSIS — I82402 Acute embolism and thrombosis of unspecified deep veins of left lower extremity: Secondary | ICD-10-CM

## 2014-11-25 DIAGNOSIS — F32A Depression, unspecified: Secondary | ICD-10-CM

## 2014-11-25 DIAGNOSIS — F419 Anxiety disorder, unspecified: Secondary | ICD-10-CM

## 2014-11-25 DIAGNOSIS — J449 Chronic obstructive pulmonary disease, unspecified: Secondary | ICD-10-CM

## 2014-11-25 NOTE — Assessment & Plan Note (Signed)
Stable. Omeprazole 20mg  daily. Prn Promethazine 12.5mg  q6h available to her. Complicated with achalasia, hx of dilatations.

## 2014-11-25 NOTE — Assessment & Plan Note (Signed)
Long standing spondylosis, prn inj and narcotics for pain control, w/c for mobility except a few steps with walker sometimes.  

## 2014-11-25 NOTE — Progress Notes (Signed)
Patient ID: Sherri Crawford, female   DOB: January 15, 1921, 81 y.o.   MRN: [id number]  Location:  SNF [facility] Provider:  Marlana Latus NP  Code Status:  DNR Goals of care: Advanced Directive information    Chief Complaint  Patient presents with  . Medical Management of Chronic Issues     HPI: Patient is a 81 y.o. female seen in the SNF at [facility] today for evaluation of chronic medical conditions, hx of dementia, has been stable on Namenda in SNF. GERD has been stable, hx of dilatations for achalasia, tx for candidiasis, f/u GI prn. Blood pressure has been controlled on Metoprolol. Hx of LLE DVT related to elevated Plt , on Eliquis chronically. Last exacerbation of COPD was 07/2014, otherwise she is stable with occasional chronic hacking cough.   Review of Systems:  Review of Systems  Constitutional: Negative for fever, chills and diaphoresis.  HENT: Positive for hearing loss. Negative for congestion, ear discharge and ear pain.   Eyes: Negative for pain, discharge and redness.  Respiratory: Negative for cough, shortness of breath and wheezing.   Cardiovascular: Negative for chest pain, palpitations and leg swelling.  Gastrointestinal: Negative for nausea, vomiting, abdominal pain and diarrhea.  Genitourinary: Positive for frequency. Negative for dysuria and urgency.  Musculoskeletal: Positive for back pain. Negative for myalgias and neck pain.       Lower extremity weakness. Rods wheelchair.  Skin: Negative for rash.  Neurological: Negative for dizziness, tremors, seizures, weakness and headaches.  Endo/Heme/Allergies: Does not bruise/bleed easily.  Psychiatric/Behavioral: Positive for memory loss. Negative for suicidal ideas and hallucinations. The patient is not nervous/anxious.     Past Medical History  Diagnosis Date  . Macular degeneration   . COPD (chronic obstructive pulmonary disease)   . Hypertension   . Coronary artery disease   . Hypercholesterolemia   . GERD  (gastroesophageal reflux disease)   . Asthmatic bronchitis   . History of colonic polyps   . DJD (degenerative joint disease)   . Lumbar back pain   . Osteoporosis   . History of transient ischemic attack (TIA)   . Anxiety   . Essential thrombocythemia   . Diverticulosis 05/10/2012  . Osteoarthrosis, unspecified whether generalized or localized, unspecified site 05/09/2012  . Memory loss 05/09/2012  . Abnormal weight gain 05/09/2012  . Phlebitis and thrombophlebitis of other deep vessels of lower extremities 07/10/2010  . Essential thrombocythemia 06/09/2009  . Depression   . Esophageal stricture   . Achalasia 10/15/2014    Patient Active Problem List   Diagnosis Date Noted  . Achalasia 10/15/2014  . Essential hypertension, benign 10/30/2013  . Achalasia of esophagus 09/18/2013  . Loss of weight 08/27/2013  . S/P balloon dilatation of esophageal stricture 07/28/2013  . Candida esophagitis 07/28/2013  . Esophageal dysmotility 07/26/2013  . Foreign body in esophagus 07/26/2013  . Nonspecific (abnormal) findings on radiological and other examination of gastrointestinal tract 07/26/2013  . Fall 06/25/2013  . Protein-calorie malnutrition, severe 11/24/2012  . Dry skin dermatitis 11/18/2012  . Dementia 09/16/2012  . Hip fracture 09/08/2012  . Deep vein thrombosis of left lower extremity 07/19/2012  . Anxiety and depression 03/12/2012  . DVT (deep venous thrombosis) 07/13/2010  . Edema of leg 07/01/2010  . Left leg DVT 07/01/2010  . Esophageal dysphagia 05/27/2010  . SYNCOPE 07/30/2009  . MACULAR DEGENERATION 01/26/2008  . CORONARY ARTERY DISEASE 03/31/2007  . COLONIC POLYPS 03/12/2007  . HYPERCHOLESTEROLEMIA 03/12/2007  . 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  GERD 03/12/2007  . DEGENERATIVE JOINT DISEASE 03/12/2007  . BACK PAIN, LUMBAR 03/12/2007  . Osteoporosis 03/12/2007  . TRANSIENT ISCHEMIC ATTACKS, HX OF 03/12/2007    Allergies  Allergen Reactions  . Sulfonamide Derivatives  Other (See Comments)    Pt is unsure of reaction    Medications: Patient's Medications  New Prescriptions   No medications on file  Previous Medications   ACETAMINOPHEN (TYLENOL) 325 MG TABLET    Take 650 mg by mouth every 4 (four) hours as needed.   ALBUTEROL (PROVENTIL HFA;VENTOLIN HFA) 108 (90 BASE) MCG/ACT INHALER    Inhale 2 puffs into the lungs every 6 (six) hours as needed for wheezing or shortness of breath.   ATORVASTATIN (LIPITOR) 20 MG TABLET    Take 20 mg by mouth every evening.    FEEDING SUPPLEMENT (RESOURCE BREEZE) LIQD    Take 90 mLs by mouth 2 (two) times daily between meals.    FLUTICASONE FUROATE-VILANTEROL (BREO ELLIPTA) 100-25 MCG/INH AEPB    Inhale 1 Dose into the lungs daily.   HYDROCODONE-ACETAMINOPHEN (NORCO) 10-325 MG PER TABLET    Take 1 tablet by mouth 4 (four) times daily.   HYDROXYUREA (HYDREA) 500 MG CAPSULE    Take 500-1,000 mg by mouth See admin instructions. Take 1000 mg on Monday and 500 mg on Sunday, Wednesday and Friday   LORAZEPAM (ATIVAN) 0.5 MG TABLET    Take 0.5 mg by mouth every 8 (eight) hours as needed for anxiety (and nausea).    MEMANTINE HCL ER (NAMENDA XR) 14 MG CP24    Take 14 mg by mouth daily.   METOPROLOL TARTRATE (LOPRESSOR) 25 MG TABLET    Take 25 mg by mouth 2 (two) times daily.   MONTELUKAST (SINGULAIR) 10 MG TABLET    Take 10 mg by mouth at bedtime.   MULTIPLE VITAMINS-MINERALS (PRESERVISION AREDS 2) CAPS    Take 1 capsule by mouth 2 (two) times daily.   NIFEDIPINE (PROCARDIA) 10 MG CAPSULE    Take 1 capsule (10 mg total) by mouth 3 (three) times daily before meals.   NITROGLYCERIN (NITROSTAT) 0.4 MG SL TABLET    Place 0.4 mg under the tongue every 5 (five) minutes as needed for chest pain.   OMEPRAZOLE (PRILOSEC) 20 MG CAPSULE    Take 20 mg by mouth at bedtime.    PROMETHAZINE (PHENERGAN) 25 MG SUPPOSITORY    Place 25 mg rectally every 4 (four) hours as needed for nausea or vomiting.   PROMETHAZINE (PHENERGAN) 25 MG TABLET    Take 25  mg by mouth every 4 (four) hours as needed for nausea or vomiting.   RIVAROXABAN (XARELTO) 15 MG TABS TABLET    Take 15 mg by mouth daily.   SERTRALINE (ZOLOFT) 25 MG TABLET    Take 25 mg by mouth daily.  Modified Medications   No medications on file  Discontinued Medications   No medications on file    Physical Exam: Filed Vitals:   11/25/14 1252  BP: 140/70  Pulse: 72  Temp: 98.2 F (36.8 C)  TempSrc: Tympanic  Resp: 18   There is no weight on file to calculate BMI.  Physical Exam  Constitutional: She is oriented to person, place, and time. She appears well-developed and well-nourished. No distress.  HENT:  Head: Normocephalic and atraumatic.  Eyes: Conjunctivae and EOM are normal. Pupils are equal, round, and reactive to light.  Neck: Normal range of motion. Neck supple. No JVD present. No thyromegaly present.  Cardiovascular: Normal  rate, regular rhythm and normal heart sounds.   Pulmonary/Chest: Effort normal. She has decreased breath sounds. She has no wheezes. She has no rales.  Decreased breath sounds Bilat. Lungs.   Abdominal: Soft. Bowel sounds are normal. There is no tenderness.  Musculoskeletal: Normal range of motion. She exhibits no edema or tenderness.  Lower back-baseline: pain is well controlled with prn inj, narcotics, w/c   Lymphadenopathy:    She has no cervical adenopathy.  Neurological: She is alert and oriented to person, place, and time. She has normal reflexes. No cranial nerve deficit. She exhibits normal muscle tone. Coordination normal.  Skin: Skin is warm and dry. No rash noted. She is not diaphoretic. No erythema (LLE).  The right hip surgical scar  Psychiatric: She has a normal mood and affect. Her affect is not angry, not blunt, not labile and not inappropriate. Her speech is not delayed, not tangential and not slurred. She is hyperactive and slowed. She is not agitated, not aggressive, not withdrawn and not combative. Thought content is not  paranoid and not delusional. Cognition and memory are impaired. She does not express impulsivity or inappropriate judgment. She exhibits abnormal recent memory.    Labs reviewed: Basic Metabolic Panel:  Recent Labs  10/16/14 0154 10/22/14  NA 143 142  K 3.6 3.9  CL 106  --   CO2 26  --   GLUCOSE 95  --   BUN 17 14  CREATININE 0.82 0.6  CALCIUM 9.4  --     Liver Function Tests:  Recent Labs  10/16/14 0154 10/22/14  AST 35 16  ALT 20 12  ALKPHOS 70 52  BILITOT 1.2  --   PROT 7.4  --   ALBUMIN 4.0  --     CBC:  Recent Labs  10/16/14 0154 10/22/14 11/05/14  WBC 13.4* 6.5 6.9  NEUTROABS 10.9*  --   --   HGB 14.0 12.1 13.5  HCT 43.3 36 40  MCV 108.5*  --   --   PLT 396 341 338    Lab Results  Component Value Date   TSH 1.674 10/16/2014   Lab Results  Component Value Date   HGBA1C 5.5 10/16/2014   Lab Results  Component Value Date   CHOL 169 09/13/2011   HDL 62.80 09/13/2011   LDLCALC 79 09/13/2011   LDLDIRECT 152.3 06/04/2009   TRIG 137.0 09/13/2011   CHOLHDL 3 09/13/2011    Significant Diagnostic Results since last visit: none  Patient Care Team: Estill Dooms, MD as PCP - General (Internal Medicine) Estill Dooms, MD as PCP - Internal Medicine (Internal Medicine) Annia Belt, MD as Consulting Physician (Hematology and Oncology) Suella Broad, MD as Consulting Physician (Physical Medicine and Rehabilitation) Noralee Space, MD (Pulmonary Disease) [facility] Man [name] X, NP as Nurse Practitioner (Nurse Practitioner) Inda Castle, MD as Consulting Physician (Gastroenterology)  Assessment/Plan Problem List Items Addressed This Visit    GERD    Stable. Omeprazole 20mg  daily. Prn Promethazine 12.5mg  q6h available to her. Complicated with achalasia, hx of dilatations.       Essential hypertension, benign    Controlled, continue Metoprolol 25mg  bid.       DVT (deep venous thrombosis)    Switched to [place] since 09/12/12.  Left. 11/05/14 ptl 338      Dementia - Primary    11/25/14 MMSE 28/30, continue Namenda 14mg  daily       COPD with asthma    dc'd Epipen  prn and Immunotherapy per [facility] 01/17/13 --maintenance, takes Montelukast 10mg  daily, Breo Ellipta I daily inhaled, Ventolin HFA 2 puffs q6h prn.  -last flare up 07/2014      BACK PAIN, LUMBAR    Long standing spondylosis, prn inj and narcotics for pain control, w/c for mobility except a few steps with walker sometimes.       Anxiety and depression    Stable, continue Sertraline 25mg  since 10/12/14(GDR) and Mirtazapine 7.5mg .       Achalasia of esophagus    09/18/13 Nifedipine 10mg  tid ac meals. 11/17/14 GI Deatra Ina f/u as neeed          Family/ staff Communication: continue SNF for care needs.   Labs/tests ordered:  none  ManXie [name] NP Geriatrics [facility] [address], [place] [address] On Call:  [phone] & follow prompts after 5pm & weekends Office Phone:  [phone] Office Fax:  [phone]

## 2014-11-25 NOTE — Assessment & Plan Note (Signed)
09/18/13 Nifedipine 10mg  tid ac meals. 11/17/14 GI Deatra Ina f/u as neeed

## 2014-11-25 NOTE — Assessment & Plan Note (Signed)
Switched to [place] since 09/12/12. Left. 11/05/14 ptl 338

## 2014-11-25 NOTE — Assessment & Plan Note (Signed)
dc'd Epipen prn and Immunotherapy per [facility] 01/17/13 --maintenance, takes Montelukast 10mg  daily, Breo Ellipta I daily inhaled, Ventolin HFA 2 puffs q6h prn.  -last flare up 07/2014

## 2014-11-25 NOTE — Assessment & Plan Note (Signed)
11/25/14 MMSE 28/30, continue Namenda 14mg  daily

## 2014-11-25 NOTE — Assessment & Plan Note (Signed)
Stable, continue Sertraline 25mg since 10/12/14(GDR) and Mirtazapine 7.5mg.  

## 2014-11-25 NOTE — Assessment & Plan Note (Signed)
Controlled, continue Metoprolol 25mg bid 

## 2014-12-18 ENCOUNTER — Non-Acute Institutional Stay (SKILLED_NURSING_FACILITY): Payer: Medicare Other | Admitting: Internal Medicine

## 2014-12-18 ENCOUNTER — Encounter: Payer: Self-pay | Admitting: Internal Medicine

## 2014-12-18 DIAGNOSIS — J449 Chronic obstructive pulmonary disease, unspecified: Secondary | ICD-10-CM | POA: Diagnosis not present

## 2014-12-18 DIAGNOSIS — J45909 Unspecified asthma, uncomplicated: Secondary | ICD-10-CM | POA: Diagnosis not present

## 2014-12-18 DIAGNOSIS — I10 Essential (primary) hypertension: Secondary | ICD-10-CM

## 2014-12-18 DIAGNOSIS — M810 Age-related osteoporosis without current pathological fracture: Secondary | ICD-10-CM | POA: Diagnosis not present

## 2014-12-18 DIAGNOSIS — F0390 Unspecified dementia without behavioral disturbance: Secondary | ICD-10-CM | POA: Diagnosis not present

## 2014-12-18 NOTE — Progress Notes (Signed)
Patient ID: Sherri Crawford, female   DOB: 04/10/1920, 81 y.o.   MRN: [id number]    [name] Room Number: 35  Place of Service: SNF (31)     Allergies  Allergen Reactions  . Sulfonamide Derivatives Other (See Comments)    Pt is unsure of reaction    Chief Complaint  Patient presents with  . 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 Worse in the early morning. Present throughout the day and evening. Her treatments include Singulair 10 mg tablet daily, debris out Alimta 1 inhalation daily, and albuterol for rescue inhaler.  Dementia, without behavioral disturbance - appears slightly more confused than a year ago remains on memantine 14 mg daily.  Osteoporosis - no active treatment at this time. I'm skeptical and further medications will be helpful in this very elderly lady with dementia.  Essential hypertension, benign - controlled    Medications: Patient's Medications  New Prescriptions   No medications on file  Previous Medications   ACETAMINOPHEN (TYLENOL) 325 MG TABLET    Take 650 mg by mouth every 4 (four) hours as needed.   ALBUTEROL (PROVENTIL HFA;VENTOLIN HFA) 108 (90 BASE) MCG/ACT INHALER    Inhale 2 puffs into the lungs every 6 (six) hours as needed for wheezing or shortness of breath.   ATORVASTATIN (LIPITOR) 20 MG TABLET    Take 20 mg by mouth every evening.    FEEDING SUPPLEMENT (RESOURCE BREEZE) LIQD    Take 90 mLs by mouth 2 (two) times daily between meals.    FLUTICASONE FUROATE-VILANTEROL (BREO ELLIPTA) 100-25 MCG/INH AEPB    Inhale 1 Dose into the lungs daily.   HYDROCODONE-ACETAMINOPHEN (NORCO) 10-325 MG PER TABLET    Take 1 tablet by mouth 4 (four) times daily.   HYDROXYUREA (HYDREA) 500 MG CAPSULE    Take 500-1,000 mg by mouth See admin instructions. Take 1000 mg on Monday and 500 mg on Sunday, Wednesday and Friday   LORAZEPAM (ATIVAN) 0.5 MG TABLET    Take 0.5 mg by mouth  every 8 (eight) hours as needed for anxiety (and nausea).    MEMANTINE HCL ER (NAMENDA XR) 14 MG CP24    Take 14 mg by mouth daily.   METOPROLOL TARTRATE (LOPRESSOR) 25 MG TABLET    Take 25 mg by mouth 2 (two) times daily.   MONTELUKAST (SINGULAIR) 10 MG TABLET    Take 10 mg by mouth at bedtime.   MULTIPLE VITAMINS-MINERALS (PRESERVISION AREDS 2) CAPS    Take 1 capsule by mouth 2 (two) times daily.   NIFEDIPINE (PROCARDIA) 10 MG CAPSULE    Take 1 capsule (10 mg total) by mouth 3 (three) times daily before meals.   NITROGLYCERIN (NITROSTAT) 0.4 MG SL TABLET    Place 0.4 mg under the tongue every 5 (five) minutes as needed for chest pain.   OMEPRAZOLE (PRILOSEC) 20 MG CAPSULE    Take 20 mg by mouth at bedtime.    RIVAROXABAN (XARELTO) 15 MG TABS TABLET    Take 15 mg by mouth daily.   SERTRALINE (ZOLOFT) 25 MG TABLET    Take 25 mg by mouth daily.  Modified Medications   No medications on file  Discontinued Medications   PROMETHAZINE (PHENERGAN) 25 MG SUPPOSITORY    Place 25 mg rectally every 4 (four) hours as needed for nausea or vomiting.   PROMETHAZINE (PHENERGAN) 25 MG TABLET    Take 25 mg by  mouth every 4 (four) hours as needed for nausea or vomiting.     Review of Systems  Constitutional: Positive for fever. Negative for chills and diaphoresis.  HENT: Positive for hearing loss. Negative for congestion, ear discharge, ear pain, nosebleeds, sore throat and tinnitus.   Eyes: Negative for photophobia, pain, discharge and redness.  Respiratory: Positive for shortness of breath (Morning are worse, but she remains dyspneic throughout the day.). Negative for cough, wheezing and stridor.   Cardiovascular: Negative for chest pain, palpitations and leg swelling.  Gastrointestinal: Negative for nausea, vomiting, abdominal pain, diarrhea, constipation and blood in stool.  Endocrine: Negative for polydipsia.  Genitourinary: Positive for frequency. Negative for dysuria, urgency, hematuria and flank  pain.  Musculoskeletal: Positive for back pain and gait problem. Negative for myalgias and neck pain.       Lower extremity weakness. Rods wheelchair.  Skin: Negative for rash.  Allergic/Immunologic: Negative for environmental allergies.  Neurological: Negative for dizziness, tremors, seizures, weakness and headaches.  Hematological: Negative.  Does not bruise/bleed easily.  Psychiatric/Behavioral: Positive for confusion. Negative for suicidal ideas and hallucinations. The patient is nervous/anxious.     Filed Vitals:   12/18/14 1350  BP: 124/68  Pulse: 68  Temp: 98.8 F (37.1 C)  Resp: 18  Height: _0  (1.549 m)  Weight: 107 lb 11.2 oz (48.852 kg)   Body mass index is 20.36 kg/(m^2).  Physical Exam  Constitutional: She is oriented to person, place, and time. She appears well-developed and well-nourished. No distress.  HENT:  Head: Normocephalic and atraumatic.  Eyes: Conjunctivae and EOM are normal. Pupils are equal, round, and reactive to light.  Neck: Normal range of motion. Neck supple. No JVD present. No thyromegaly present.  Cardiovascular: Normal rate, regular rhythm and normal heart sounds.   Pulmonary/Chest: Effort normal. She has decreased breath sounds. She has no wheezes. She has no rales.  Decreased breath sounds Bilat. Lungs.   Abdominal: Soft. Bowel sounds are normal. There is no tenderness.  Musculoskeletal: Normal range of motion. She exhibits no edema or tenderness.  Lower back-baseline: pain at end of day occasionally.   Lymphadenopathy:    She has no cervical adenopathy.  Neurological: She is alert and oriented to person, place, and time. She has normal reflexes. No cranial nerve deficit. She exhibits normal muscle tone. Coordination normal.  8/ 26/16 BIMS score 14/15  Skin: Skin is warm and dry. No rash noted. She is not diaphoretic. No erythema (LLE).  The right hip surgical incision healed.   Psychiatric: She has a normal mood and affect. Her affect is  not angry, not blunt, not labile and not inappropriate. Her speech is not delayed, not tangential and not slurred. She is hyperactive and slowed. She is not agitated, not aggressive, not withdrawn and not combative. Thought content is not paranoid and not delusional. Cognition and memory are impaired. She does not express impulsivity or inappropriate judgment. She exhibits abnormal recent memory.     Labs reviewed: Lab Summary Latest Ref Rng 11/05/2014 10/22/2014 10/16/2014 07/14/2014  Hemoglobin 12.0 - 16.0 g/dL 13.5 12.1 14.0 12.9  Hematocrit 36 - 46 % 40 36 43.3 37  White count - 6.9 6.5 13.4(H) 8.2  Platelet count 150 - 399 K/L 338 341 396 276  Sodium 137 - 147 mmol/L (None) 142 143 (None)  Potassium 3.4 - 5.3 mmol/L (None) 3.9 3.6 (None)  Calcium 8.9 - 10.3 mg/dL (None) (None) 9.4 (None)  Phosphorus - (None) (None) (None) (None)  Creatinine 0.5 -  1.1 mg/dL (None) 0.6 0.82 (None)  AST 13 - 35 U/L (None) 16 35 (None)  Alk Phos 25 - 125 U/L (None) 52 70 (None)  Bilirubin 0.3 - 1.2 mg/dL (None) (None) 1.2 (None)  Glucose - (None) 86 95 (None)  Cholesterol - (None) (None) (None) (None)  HDL cholesterol - (None) (None) (None) (None)  Triglycerides - (None) (None) (None) (None)  LDL Direct - (None) (None) (None) (None)  LDL Calc - (None) (None) (None) (None)  Total protein 6.5 - 8.1 g/dL (None) (None) 7.4 (None)  Albumin 3.5 - 5.0 g/dL (None) (None) 4.0 (None)   Lab Results  Component Value Date   TSH 1.674 10/16/2014   Lab Results  Component Value Date   BUN 14 10/22/2014   Lab Results  Component Value Date   HGBA1C 5.5 10/16/2014       Assessment/Plan  1. COPD with asthma ([place]) Continue Breo Ellipita  2. Dementia, without behavioral disturbance Increase Namenda Xr to 28 mg qd  3. Osteoporosis observe  4. Essential hypertension, benign controlled

## 2014-12-28 ENCOUNTER — Encounter: Payer: Self-pay | Admitting: Oncology

## 2014-12-28 ENCOUNTER — Ambulatory Visit (INDEPENDENT_AMBULATORY_CARE_PROVIDER_SITE_OTHER): Payer: Medicare Other | Admitting: Oncology

## 2014-12-28 ENCOUNTER — Non-Acute Institutional Stay (SKILLED_NURSING_FACILITY): Payer: Medicare Other | Admitting: Nurse Practitioner

## 2014-12-28 ENCOUNTER — Encounter: Payer: Self-pay | Admitting: Nurse Practitioner

## 2014-12-28 VITALS — BP 98/45 | HR 65 | Temp 98.1°F | Ht <= 58 in | Wt 109.0 lb

## 2014-12-28 DIAGNOSIS — I10 Essential (primary) hypertension: Secondary | ICD-10-CM | POA: Diagnosis not present

## 2014-12-28 DIAGNOSIS — F418 Other specified anxiety disorders: Secondary | ICD-10-CM

## 2014-12-28 DIAGNOSIS — F329 Major depressive disorder, single episode, unspecified: Secondary | ICD-10-CM

## 2014-12-28 DIAGNOSIS — I82502 Chronic embolism and thrombosis of unspecified deep veins of left lower extremity: Secondary | ICD-10-CM | POA: Diagnosis not present

## 2014-12-28 DIAGNOSIS — K219 Gastro-esophageal reflux disease without esophagitis: Secondary | ICD-10-CM | POA: Diagnosis not present

## 2014-12-28 DIAGNOSIS — F0390 Unspecified dementia without behavioral disturbance: Secondary | ICD-10-CM

## 2014-12-28 DIAGNOSIS — F32A Depression, unspecified: Secondary | ICD-10-CM

## 2014-12-28 DIAGNOSIS — J45909 Unspecified asthma, uncomplicated: Secondary | ICD-10-CM

## 2014-12-28 DIAGNOSIS — F419 Anxiety disorder, unspecified: Secondary | ICD-10-CM

## 2014-12-28 DIAGNOSIS — I82432 Acute embolism and thrombosis of left popliteal vein: Secondary | ICD-10-CM

## 2014-12-28 DIAGNOSIS — Z993 Dependence on wheelchair: Secondary | ICD-10-CM | POA: Diagnosis not present

## 2014-12-28 DIAGNOSIS — J449 Chronic obstructive pulmonary disease, unspecified: Secondary | ICD-10-CM

## 2014-12-28 DIAGNOSIS — D473 Essential (hemorrhagic) thrombocythemia: Secondary | ICD-10-CM

## 2014-12-28 DIAGNOSIS — M5440 Lumbago with sciatica, unspecified side: Secondary | ICD-10-CM | POA: Diagnosis not present

## 2014-12-28 DIAGNOSIS — Z7901 Long term (current) use of anticoagulants: Secondary | ICD-10-CM

## 2014-12-28 LAB — CBC AND DIFFERENTIAL
HEMATOCRIT: 35 % — AB (ref 36–46)
HEMOGLOBIN: 13.4 g/dL (ref 12.0–16.0)
PLATELETS: 424 10*3/uL — AB (ref 150–399)
WBC: 8.9 10^3/mL

## 2014-12-28 NOTE — Progress Notes (Signed)
Patient ID: Sherri Crawford, female   DOB: 08/21/1920, 81 y.o.   MRN: [id number]  Location:  SNF [facility] Provider:  Marlana Latus NP  Code Status:  DNR Goals of care: Advanced Directive information    Chief Complaint  Patient presents with  . Medical Management of Chronic Issues     HPI: Patient is a 81 y.o. female seen in the SNF at [facility] today for evaluation of chronic medical conditions, hx of dementia, has been stable on Namenda in SNF. GERD has been stable, hx of dilatations for achalasia, treated for candidiasis, f/u GI prn. Blood pressure has been controlled on Metoprolol. Hx of LLE DVT related to elevated Plt , on Eliquis chronically. Last exacerbation of COPD was 07/2014, otherwise she is stable with occasional chronic hacking cough.   Review of Systems:  Review of Systems  Constitutional: Negative for fever, chills and diaphoresis.  HENT: Positive for hearing loss. Negative for congestion, ear discharge and ear pain.   Eyes: Negative for pain, discharge and redness.  Respiratory: Negative for cough, shortness of breath and wheezing.   Cardiovascular: Negative for chest pain, palpitations and leg swelling.  Gastrointestinal: Negative for nausea, vomiting, abdominal pain and diarrhea.  Genitourinary: Positive for frequency. Negative for dysuria and urgency.  Musculoskeletal: Positive for back pain. Negative for myalgias and neck pain.       Lower extremity weakness. Rods wheelchair.  Skin: Negative for rash.  Neurological: Negative for dizziness, tremors, seizures, weakness and headaches.  Endo/Heme/Allergies: Does not bruise/bleed easily.  Psychiatric/Behavioral: Positive for memory loss. Negative for suicidal ideas and hallucinations. The patient is not nervous/anxious.     Past Medical History  Diagnosis Date  . Macular degeneration   . COPD (chronic obstructive pulmonary disease) ([place])   . Hypertension   . Coronary artery disease   . Hypercholesterolemia     . GERD (gastroesophageal reflux disease)   . Asthmatic bronchitis   . History of colonic polyps   . DJD (degenerative joint disease)   . Lumbar back pain   . Osteoporosis   . History of transient ischemic attack (TIA)   . Anxiety   . Essential thrombocythemia ([place])   . Diverticulosis 05/10/2012  . Osteoarthrosis, unspecified whether generalized or localized, unspecified site 05/09/2012  . Memory loss 05/09/2012  . Abnormal weight gain 05/09/2012  . Phlebitis and thrombophlebitis of other deep vessels of lower extremities 07/10/2010  . Essential thrombocythemia ([place]) 06/09/2009  . Depression   . Esophageal stricture   . Achalasia 10/15/2014  . Essential thrombocythemia ([place]) 12/28/2014    1,000 mg on Mondays, 500 mg on Sundays, wednesdays, Fridays  Updated 12/28/14    Patient Active Problem List   Diagnosis Date Noted  . Essential thrombocythemia ([place]) 12/28/2014  . Essential hypertension, benign 10/30/2013  . Achalasia of esophagus 09/18/2013  . Loss of weight 08/27/2013  . S/P balloon dilatation of esophageal stricture 07/28/2013  . Candida esophagitis ([place]) 07/28/2013  . Esophageal dysmotility 07/26/2013  . Foreign body in esophagus 07/26/2013  . Fall 06/25/2013  . Dry skin dermatitis 11/18/2012  . Dementia 09/16/2012  . Hip fracture ([place]) 09/08/2012  . Deep vein thrombosis of left lower extremity ([place]) 07/19/2012  . Anxiety and depression 03/12/2012  . DVT (deep venous thrombosis) ([place]) 07/13/2010  . Edema of leg 07/01/2010  . Left leg DVT ([place]) 07/01/2010  . Esophageal dysphagia 05/27/2010  . SYNCOPE 07/30/2009  . MACULAR DEGENERATION 01/26/2008  . CORONARY ARTERY DISEASE 03/31/2007  . COLONIC POLYPS  03/12/2007  . HYPERCHOLESTEROLEMIA 03/12/2007  . COPD with asthma ([place]) 03/12/2007  . GERD 03/12/2007  . DEGENERATIVE JOINT DISEASE 03/12/2007  . BACK PAIN, LUMBAR 03/12/2007  . Osteoporosis 03/12/2007  . TRANSIENT ISCHEMIC ATTACKS, HX OF 03/12/2007    Allergies   Allergen Reactions  . Sulfonamide Derivatives Other (See Comments)    Pt is unsure of reaction    Medications: Patient's Medications  New Prescriptions   No medications on file  Previous Medications   ACETAMINOPHEN (TYLENOL) 325 MG TABLET    Take 650 mg by mouth every 4 (four) hours as needed.   ALBUTEROL (PROVENTIL HFA;VENTOLIN HFA) 108 (90 BASE) MCG/ACT INHALER    Inhale 2 puffs into the lungs every 6 (six) hours as needed for wheezing or shortness of breath.   ATORVASTATIN (LIPITOR) 20 MG TABLET    Take 20 mg by mouth every evening.    FEEDING SUPPLEMENT (RESOURCE BREEZE) LIQD    Take 90 mLs by mouth 2 (two) times daily between meals.    FLUTICASONE FUROATE-VILANTEROL (BREO ELLIPTA) 100-25 MCG/INH AEPB    Inhale 1 Dose into the lungs daily.   HYDROCODONE-ACETAMINOPHEN (NORCO) 10-325 MG PER TABLET    Take 1 tablet by mouth 4 (four) times daily.   HYDROXYUREA (HYDREA) 500 MG CAPSULE    Take 500-1,000 mg by mouth See admin instructions. Take 1000 mg on Monday and 500 mg on Sunday, Wednesday and Friday   LORAZEPAM (ATIVAN) 0.5 MG TABLET    Take 0.5 mg by mouth every 8 (eight) hours as needed for anxiety (and nausea).    MEMANTINE HCL ER (NAMENDA XR) 14 MG CP24    Take 14 mg by mouth daily.   METOPROLOL TARTRATE (LOPRESSOR) 25 MG TABLET    Take 25 mg by mouth 2 (two) times daily.   MONTELUKAST (SINGULAIR) 10 MG TABLET    Take 10 mg by mouth at bedtime.   MULTIPLE VITAMINS-MINERALS (PRESERVISION AREDS 2) CAPS    Take 1 capsule by mouth 2 (two) times daily.   NIFEDIPINE (PROCARDIA) 10 MG CAPSULE    Take 1 capsule (10 mg total) by mouth 3 (three) times daily before meals.   NITROGLYCERIN (NITROSTAT) 0.4 MG SL TABLET    Place 0.4 mg under the tongue every 5 (five) minutes as needed for chest pain.   OMEPRAZOLE (PRILOSEC) 20 MG CAPSULE    Take 20 mg by mouth at bedtime.    RIVAROXABAN (XARELTO) 15 MG TABS TABLET    Take 15 mg by mouth daily.   SERTRALINE (ZOLOFT) 25 MG TABLET    Take 25 mg by  mouth daily.  Modified Medications   No medications on file  Discontinued Medications   No medications on file    Physical Exam: Filed Vitals:   12/28/14 1450  BP: 140/70  Pulse: 70  Temp: 98.2 F (36.8 C)  TempSrc: Tympanic  Resp: 18   There is no weight on file to calculate BMI.  Physical Exam  Constitutional: She is oriented to person, place, and time. She appears well-developed and well-nourished. No distress.  HENT:  Head: Normocephalic and atraumatic.  Eyes: Conjunctivae and EOM are normal. Pupils are equal, round, and reactive to light.  Neck: Normal range of motion. Neck supple. No JVD present. No thyromegaly present.  Cardiovascular: Normal rate, regular rhythm and normal heart sounds.   Pulmonary/Chest: Effort normal. She has decreased breath sounds. She has no wheezes. She has no rales.  Decreased breath sounds Bilat. Lungs.   Abdominal: Soft.  Bowel sounds are normal. There is no tenderness.  Musculoskeletal: Normal range of motion. She exhibits no edema or tenderness.  Lower back-baseline: pain is well controlled with prn inj, narcotics, w/c   Lymphadenopathy:    She has no cervical adenopathy.  Neurological: She is alert and oriented to person, place, and time. She has normal reflexes. No cranial nerve deficit. She exhibits normal muscle tone. Coordination normal.  Skin: Skin is warm and dry. No rash noted. She is not diaphoretic. No erythema (LLE).  The right hip surgical scar  Psychiatric: She has a normal mood and affect. Her affect is not angry, not blunt, not labile and not inappropriate. Her speech is not delayed, not tangential and not slurred. She is hyperactive and slowed. She is not agitated, not aggressive, not withdrawn and not combative. Thought content is not paranoid and not delusional. Cognition and memory are impaired. She does not express impulsivity or inappropriate judgment. She exhibits abnormal recent memory.    Labs reviewed: Basic Metabolic  Panel:  Recent Labs  10/16/14 0154 10/22/14  NA 143 142  K 3.6 3.9  CL 106  --   CO2 26  --   GLUCOSE 95  --   BUN 17 14  CREATININE 0.82 0.6  CALCIUM 9.4  --     Liver Function Tests:  Recent Labs  10/16/14 0154 10/22/14  AST 35 16  ALT 20 12  ALKPHOS 70 52  BILITOT 1.2  --   PROT 7.4  --   ALBUMIN 4.0  --     CBC:  Recent Labs  10/16/14 0154 10/22/14 11/05/14 12/28/14 1108  WBC 13.4* 6.5 6.9 8.9  NEUTROABS 10.9*  --   --  6.8  HGB 14.0 12.1 13.5  --   HCT 43.3 36 40 39.8  MCV 108.5*  --   --   --   PLT 396 341 338  --     Lab Results  Component Value Date   TSH 1.674 10/16/2014   Lab Results  Component Value Date   HGBA1C 5.5 10/16/2014   Lab Results  Component Value Date   CHOL 169 09/13/2011   HDL 62.80 09/13/2011   LDLCALC 79 09/13/2011   LDLDIRECT 152.3 06/04/2009   TRIG 137.0 09/13/2011   CHOLHDL 3 09/13/2011    Significant Diagnostic Results since last visit: none  Patient Care Team: Estill Dooms, MD as PCP - Internal Medicine (Internal Medicine) Annia Belt, MD as Consulting Physician (Hematology and Oncology) Suella Broad, MD as Consulting Physician (Physical Medicine and Rehabilitation) Noralee Space, MD (Pulmonary Disease) [facility] [name] X, NP as Nurse Practitioner (Nurse Practitioner) Inda Castle, MD as Consulting Physician (Gastroenterology)  Assessment/Plan Problem List Items Addressed This Visit    GERD - Primary    Stable. Continue Omeprazole 20mg  daily. Prn Promethazine 12.5mg  q6h available to her. Complicated with achalasia, hx of dilatations.       Essential thrombocythemia ([place])    F/u oncology, continue hydroxyurea       Essential hypertension, benign (Chronic)    Controlled, continue Metoprolol 25mg  bid.       Dementia (Chronic)    11/25/14 MMSE 28/30, continue Namenda 14mg  daily       COPD with asthma ([place]) (Chronic)    dc'd Epipen prn and Immunotherapy per [facility]  01/17/13 --maintenance, takes Montelukast 10mg  daily, Breo Ellipta I daily inhaled, Ventolin HFA 2 puffs q6h prn.  -last flare up 07/2014  BACK PAIN, LUMBAR (Chronic)    Long standing spondylosis, prn inj and narcotics for pain control, w/c for mobility except a few steps with walker sometimes.       Anxiety and depression    Stable, continue Sertraline 25mg  since 10/12/14(GDR) and Mirtazapine 7.5mg .           Family/ staff Communication: continue SNF for care needs.   Labs/tests ordered:  none  ManXie [name] NP Geriatrics [facility] [address], [place] [address] On Call:  [phone] & follow prompts after 5pm & weekends Office Phone:  [phone] Office Fax:  [phone]

## 2014-12-28 NOTE — Patient Instructions (Signed)
To lab today Return visit to Hematology, Dr Beryle Beams in 6 months Continue every 2 month CBC, differential at [facility] next not due until end of December (We checked today in office 12/28/14)

## 2014-12-28 NOTE — Progress Notes (Signed)
Patient ID: Sherri Crawford, female   DOB: 11/11/1920, 81 y.o.   MRN: [id number] Hematology and Oncology Follow Up Visit  GLENDORA CLOUATRE [id number] 1920/08/24 81 y.o. 12/28/2014 3:26 PM   Principle Diagnosis: Encounter Diagnoses  Name Primary?  . Acute deep vein thrombosis (DVT) of popliteal vein of left lower extremity (HCC) Yes  . Essential thrombocythemia [facility])   Clinical Summary: 51 year old woman [facility]. She has long-standing essential thrombocythemia well controlled on hydroxyurea 1 g on Mondays, 500 mg on Sundays and Wednesdays and Fridays. She had an unprovoked DVT of the left lower extremity in May 2012. She was on Coumadin for 2 years but in 2014 her primary care doctor changed her to Xarelto 15 mg daily which she continues at this time. She has a sedentary existence  and spends her entire day in a wheelchair. Additional medical problems include esophageal stricture requiring intermittent dilatations followed by Dr. Erskine Emery. Last dilatation procedure in 2014. She had a Botox injection into the esophagus in July 2015. This was successful and she reported no swallowing dysfunction at time of recent GI follow-up visit on 11/17/2014. She fell and broke her right hip and required surgery in July 2014.  Interim History:   She has had no major interim medical problems. She states that her energy level and her appetite are at her baseline. Her son always accompanies her to her doctor appointments and is here today. She denies any severe headaches, change in vision, diplopia, slurred speech, or focal weakness. She has mild dementia. She is hard of hearing. She reports no change in bowel habit. Medications: reviewed  Allergies:  Allergies  Allergen Reactions  . Sulfonamide Derivatives Other (See Comments)    Pt is unsure of reaction    Review of Systems: See HPI Remaining ROS negative:   Physical Exam: Blood pressure 98/45, pulse 65,  temperature 98.1 F (36.7 C), temperature source Oral, height 4\' 9"  (1.448 m), weight 109 lb (49.442 kg), SpO2 94 %. Wt Readings from Last 3 Encounters:  12/28/14 109 lb (49.442 kg)  12/18/14 107 lb 11.2 oz (48.852 kg)  11/17/14 109 lb (49.442 kg)     General appearance: Elderly woman in no distress Blood pressure 98/45, pulse 65, temperature 98.1 F (36.7 C), temperature source Oral, height 4\' 9"  (1.448 m), weight 109 lb (49.442 kg), SpO2 94 %.  HEENT: Pharynx no erythema or exudate. Lymphatics: No cervical, supraclavicular, or axillary adenopathy Pulmonary: Minimal crackles both lung bases. Resonant to percussion throughout Cardiac: Regular rhythm, no murmur gallop or rub Abdomen: Soft, nontender, no mass, no organomegaly Neurologic: She is alert, cooperative, and oriented. Decreased hearing. Cranial nerves otherwise grossly normal. PERRLA. I was unable to get a good look at the optic disks. Full extraocular movements. Upper body coordination normal. Gait not tested. Extremities: No Swelling or tenderness Skin: No rash or ecchymosis  Lab Results: CBC W/Diff    Component Value Date/Time   WBC 6.9 11/05/2014   WBC 13.4* 10/16/2014 0154   WBC 5.7 05/27/2013 1549   RBC 3.99 10/16/2014 0154   RBC 3.44* 05/27/2013 1549   HGB 13.5 11/05/2014   HGB 12.6 05/27/2013 1549   HCT 40 11/05/2014   HCT 37.9 05/27/2013 1549   PLT 338 11/05/2014   PLT 332 05/27/2013 1549   MCV 108.5* 10/16/2014 0154   MCV 110.1* 05/27/2013 1549   MCH 35.1* 10/16/2014 0154   MCH 36.7* 05/27/2013 1549   MCHC 32.3 10/16/2014 0154  MCHC 33.3 05/27/2013 1549   RDW 12.8 10/16/2014 0154   RDW 14.1 05/27/2013 1549   LYMPHSABS 1.7 10/16/2014 0154   LYMPHSABS 1.6 05/27/2013 1549   MONOABS 0.7 10/16/2014 0154   MONOABS 0.3 05/27/2013 1549   EOSABS 0.1 10/16/2014 0154   EOSABS 0.1 05/27/2013 1549   BASOSABS 0.0 10/16/2014 0154   BASOSABS 0.0 05/27/2013 1549     Chemistry      Component Value Date/Time    NA 142 10/22/2014   NA 143 10/16/2014 0154   NA 140 05/27/2013 1446   K 3.9 10/22/2014   K 4.5 05/27/2013 1446   CL 106 10/16/2014 0154   CL 106 07/16/2012 1417   CO2 26 10/16/2014 0154   CO2 28 05/27/2013 1446   BUN 14 10/22/2014   BUN 17 10/16/2014 0154   BUN 22.8 05/27/2013 1446   CREATININE 0.6 10/22/2014   CREATININE 0.82 10/16/2014 0154   CREATININE 1.1 05/27/2013 1446   GLU 86 10/22/2014      Component Value Date/Time   CALCIUM 9.4 10/16/2014 0154   CALCIUM 9.5 05/27/2013 1446   ALKPHOS 52 10/22/2014   ALKPHOS 63 05/27/2013 1446   AST 16 10/22/2014   AST 19 05/27/2013 1446   ALT 12 10/22/2014   ALT 11 05/27/2013 1446   BILITOT 1.2 10/16/2014 0154   BILITOT 0.33 05/27/2013 1446    CBC 11/05/2014: Hemoglobin 13.5, hematocrit 40, white count 6900, platelet count 338,000.   Radiological Studies: No results found.  Impression:  #1. Myeloproliferative disorder: Essential thrombocythemia Blood counts remain stable with no evidence for transformation to a more aggressive blood disorder on long-term, low-dose, hydroxyurea. Plan: Continue Hydrea at current dose. Check CBC with differential count every 2 months.  #2. Spontaneous DVT related to immobilization and underlying myeloproliferative disorder Continue Xarelto 15 mg daily  CC: Patient Care Team: Estill Dooms, MD as PCP - General (Internal Medicine) Estill Dooms, MD as PCP - Internal Medicine (Internal Medicine) Annia Belt, MD as Consulting Physician (Hematology and Oncology) Suella Broad, MD as Consulting Physician (Physical Medicine and Rehabilitation) Noralee Space, MD (Pulmonary Disease) [place] Otho Darner, NP as Nurse Practitioner (Nurse Practitioner) Inda Castle, MD as Consulting Physician (Gastroenterology)   Annia Belt, MD 10/31/20163:26 PM

## 2014-12-29 LAB — CBC WITH DIFFERENTIAL/PLATELET
BASOS ABS: 0 10*3/uL (ref 0.0–0.2)
Basos: 0 %
EOS (ABSOLUTE): 0.1 10*3/uL (ref 0.0–0.4)
Eos: 1 %
HEMATOCRIT: 39.8 % (ref 34.0–46.6)
HEMOGLOBIN: 13.4 g/dL (ref 11.1–15.9)
Immature Grans (Abs): 0 10*3/uL (ref 0.0–0.1)
Immature Granulocytes: 0 %
LYMPHS ABS: 1.4 10*3/uL (ref 0.7–3.1)
Lymphs: 16 %
MCH: 34.6 pg — AB (ref 26.6–33.0)
MCHC: 33.7 g/dL (ref 31.5–35.7)
MCV: 103 fL — ABNORMAL HIGH (ref 79–97)
MONOS ABS: 0.6 10*3/uL (ref 0.1–0.9)
Monocytes: 6 %
NEUTROS ABS: 6.8 10*3/uL (ref 1.4–7.0)
Neutrophils: 77 %
Platelets: 424 10*3/uL — ABNORMAL HIGH (ref 150–379)
RBC: 3.87 x10E6/uL (ref 3.77–5.28)
RDW: 14.4 % (ref 12.3–15.4)
WBC: 8.9 10*3/uL (ref 3.4–10.8)

## 2015-01-01 NOTE — Assessment & Plan Note (Signed)
Stable. Continue Omeprazole 20mg  daily. Prn Promethazine 12.5mg  q6h available to her. Complicated with achalasia, hx of dilatations.

## 2015-01-01 NOTE — Assessment & Plan Note (Signed)
dc'd Epipen prn and Immunotherapy per [place] [facility] 01/17/13 --maintenance, takes Montelukast 10mg  daily, Breo Ellipta I daily inhaled, Ventolin HFA 2 puffs q6h prn.  -last flare up 07/2014

## 2015-01-01 NOTE — Assessment & Plan Note (Signed)
F/u oncology, continue hydroxyurea

## 2015-01-01 NOTE — Assessment & Plan Note (Signed)
Stable, continue Sertraline 25mg since 10/12/14(GDR) and Mirtazapine 7.5mg.  

## 2015-01-01 NOTE — Assessment & Plan Note (Signed)
11/25/14 MMSE 28/30, continue Namenda 14mg  daily

## 2015-01-01 NOTE — Assessment & Plan Note (Signed)
Long standing spondylosis, prn inj and narcotics for pain control, w/c for mobility except a few steps with walker sometimes.  

## 2015-01-01 NOTE — Assessment & Plan Note (Signed)
Controlled, continue Metoprolol 25mg bid 

## 2015-01-04 ENCOUNTER — Other Ambulatory Visit: Payer: Self-pay | Admitting: Nurse Practitioner

## 2015-01-04 DIAGNOSIS — D473 Essential (hemorrhagic) thrombocythemia: Secondary | ICD-10-CM

## 2015-01-27 ENCOUNTER — Encounter: Payer: Self-pay | Admitting: Nurse Practitioner

## 2015-01-27 ENCOUNTER — Non-Acute Institutional Stay (SKILLED_NURSING_FACILITY): Payer: Medicare Other | Admitting: Nurse Practitioner

## 2015-01-27 DIAGNOSIS — M5440 Lumbago with sciatica, unspecified side: Secondary | ICD-10-CM

## 2015-01-27 DIAGNOSIS — F0390 Unspecified dementia without behavioral disturbance: Secondary | ICD-10-CM | POA: Diagnosis not present

## 2015-01-27 DIAGNOSIS — K219 Gastro-esophageal reflux disease without esophagitis: Secondary | ICD-10-CM | POA: Diagnosis not present

## 2015-01-27 DIAGNOSIS — I82402 Acute embolism and thrombosis of unspecified deep veins of left lower extremity: Secondary | ICD-10-CM | POA: Diagnosis not present

## 2015-01-27 DIAGNOSIS — I10 Essential (primary) hypertension: Secondary | ICD-10-CM | POA: Diagnosis not present

## 2015-01-27 DIAGNOSIS — D473 Essential (hemorrhagic) thrombocythemia: Secondary | ICD-10-CM

## 2015-01-27 DIAGNOSIS — F418 Other specified anxiety disorders: Secondary | ICD-10-CM | POA: Diagnosis not present

## 2015-01-27 DIAGNOSIS — F329 Major depressive disorder, single episode, unspecified: Secondary | ICD-10-CM

## 2015-01-27 DIAGNOSIS — F419 Anxiety disorder, unspecified: Secondary | ICD-10-CM

## 2015-01-27 DIAGNOSIS — J449 Chronic obstructive pulmonary disease, unspecified: Secondary | ICD-10-CM | POA: Diagnosis not present

## 2015-01-27 DIAGNOSIS — K220 Achalasia of cardia: Secondary | ICD-10-CM | POA: Diagnosis not present

## 2015-01-27 DIAGNOSIS — J45909 Unspecified asthma, uncomplicated: Secondary | ICD-10-CM | POA: Diagnosis not present

## 2015-01-27 DIAGNOSIS — F32A Depression, unspecified: Secondary | ICD-10-CM

## 2015-01-27 NOTE — Progress Notes (Signed)
Patient ID: Sherri Crawford, female   DOB: 08/21/1920, 81 y.o.   MRN: [id number]  Location:  SNF [facility] Provider:  Marlana Latus NP  Code Status:  DNR Goals of care: Advanced Directive information    Chief Complaint  Patient presents with  . Medical Management of Chronic Issues     HPI: Patient is a 81 y.o. female seen in the SNF at [facility] today for evaluation of chronic medical conditions, hx of dementia, has been stable on Namenda in SNF. GERD has been stable, hx of dilatations for achalasia, treated for candidiasis, f/u GI prn. Blood pressure has been controlled on Metoprolol. Hx of LLE DVT related to elevated Plt , on Eliquis chronically. Last exacerbation of COPD was 07/2014, otherwise she is stable with occasional chronic hacking cough.   Review of Systems:  Review of Systems  Constitutional: Negative for fever, chills and diaphoresis.  HENT: Positive for hearing loss. Negative for congestion, ear discharge and ear pain.   Eyes: Negative for pain, discharge and redness.  Respiratory: Negative for cough, shortness of breath and wheezing.   Cardiovascular: Negative for chest pain, palpitations and leg swelling.  Gastrointestinal: Negative for nausea, vomiting, abdominal pain and diarrhea.  Genitourinary: Positive for frequency. Negative for dysuria and urgency.  Musculoskeletal: Positive for back pain. Negative for myalgias and neck pain.       Lower extremity weakness. Rods wheelchair.  Skin: Negative for rash.  Neurological: Negative for dizziness, tremors, seizures, weakness and headaches.  Endo/Heme/Allergies: Does not bruise/bleed easily.  Psychiatric/Behavioral: Positive for memory loss. Negative for suicidal ideas and hallucinations. The patient is not nervous/anxious.     Past Medical History  Diagnosis Date  . Macular degeneration   . COPD (chronic obstructive pulmonary disease) ([place])   . Hypertension   . Coronary artery disease   . Hypercholesterolemia     . GERD (gastroesophageal reflux disease)   . Asthmatic bronchitis   . History of colonic polyps   . DJD (degenerative joint disease)   . Lumbar back pain   . Osteoporosis   . History of transient ischemic attack (TIA)   . Anxiety   . Essential thrombocythemia ([place])   . Diverticulosis 05/10/2012  . Osteoarthrosis, unspecified whether generalized or localized, unspecified site 05/09/2012  . Memory loss 05/09/2012  . Abnormal weight gain 05/09/2012  . Phlebitis and thrombophlebitis of other deep vessels of lower extremities 07/10/2010  . Essential thrombocythemia ([place]) 06/09/2009  . Depression   . Esophageal stricture   . Achalasia 10/15/2014  . Essential thrombocythemia ([place]) 12/28/2014    1,000 mg on Mondays, 500 mg on Sundays, wednesdays, Fridays  Updated 12/28/14    Patient Active Problem List   Diagnosis Date Noted  . Essential thrombocythemia ([place]) 12/28/2014  . Essential hypertension, benign 10/30/2013  . Achalasia of esophagus 09/18/2013  . Loss of weight 08/27/2013  . S/P balloon dilatation of esophageal stricture 07/28/2013  . Candida esophagitis ([place]) 07/28/2013  . Esophageal dysmotility 07/26/2013  . Foreign body in esophagus 07/26/2013  . Fall 06/25/2013  . Dry skin dermatitis 11/18/2012  . Dementia 09/16/2012  . Hip fracture ([place]) 09/08/2012  . Deep vein thrombosis of left lower extremity ([place]) 07/19/2012  . Anxiety and depression 03/12/2012  . DVT (deep venous thrombosis) ([place]) 07/13/2010  . Edema of leg 07/01/2010  . Left leg DVT ([place]) 07/01/2010  . Esophageal dysphagia 05/27/2010  . SYNCOPE 07/30/2009  . MACULAR DEGENERATION 01/26/2008  . CORONARY ARTERY DISEASE 03/31/2007  . COLONIC POLYPS  03/12/2007  . HYPERCHOLESTEROLEMIA 03/12/2007  . COPD with asthma ([place]) 03/12/2007  . GERD 03/12/2007  . DEGENERATIVE JOINT DISEASE 03/12/2007  . BACK PAIN, LUMBAR 03/12/2007  . Osteoporosis 03/12/2007  . TRANSIENT ISCHEMIC ATTACKS, HX OF 03/12/2007    Allergies   Allergen Reactions  . Sulfonamide Derivatives Other (See Comments)    Pt is unsure of reaction    Medications: Patient's Medications  New Prescriptions   No medications on file  Previous Medications   ACETAMINOPHEN (TYLENOL) 325 MG TABLET    Take 650 mg by mouth every 4 (four) hours as needed.   ALBUTEROL (PROVENTIL HFA;VENTOLIN HFA) 108 (90 BASE) MCG/ACT INHALER    Inhale 2 puffs into the lungs every 6 (six) hours as needed for wheezing or shortness of breath.   ATORVASTATIN (LIPITOR) 20 MG TABLET    Take 20 mg by mouth every evening.    FEEDING SUPPLEMENT (RESOURCE BREEZE) LIQD    Take 90 mLs by mouth 2 (two) times daily between meals.    FLUTICASONE FUROATE-VILANTEROL (BREO ELLIPTA) 100-25 MCG/INH AEPB    Inhale 1 Dose into the lungs daily.   HYDROCODONE-ACETAMINOPHEN (NORCO) 10-325 MG PER TABLET    Take 1 tablet by mouth 4 (four) times daily.   HYDROXYUREA (HYDREA) 500 MG CAPSULE    Take 500-1,000 mg by mouth See admin instructions. Take 1000 mg on Monday and 500 mg on Sunday, Wednesday and Friday   LORAZEPAM (ATIVAN) 0.5 MG TABLET    Take 0.5 mg by mouth every 8 (eight) hours as needed for anxiety (and nausea).    MEMANTINE HCL ER (NAMENDA XR) 14 MG CP24    Take 14 mg by mouth daily.   METOPROLOL TARTRATE (LOPRESSOR) 25 MG TABLET    Take 25 mg by mouth 2 (two) times daily.   MONTELUKAST (SINGULAIR) 10 MG TABLET    Take 10 mg by mouth at bedtime.   MULTIPLE VITAMINS-MINERALS (PRESERVISION AREDS 2) CAPS    Take 1 capsule by mouth 2 (two) times daily.   NIFEDIPINE (PROCARDIA) 10 MG CAPSULE    Take 1 capsule (10 mg total) by mouth 3 (three) times daily before meals.   NITROGLYCERIN (NITROSTAT) 0.4 MG SL TABLET    Place 0.4 mg under the tongue every 5 (five) minutes as needed for chest pain.   OMEPRAZOLE (PRILOSEC) 20 MG CAPSULE    Take 20 mg by mouth at bedtime.    RIVAROXABAN (XARELTO) 15 MG TABS TABLET    Take 15 mg by mouth daily.   SERTRALINE (ZOLOFT) 25 MG TABLET    Take 25 mg by  mouth daily.  Modified Medications   No medications on file  Discontinued Medications   No medications on file    Physical Exam: Filed Vitals:   01/27/15 1149  BP: 129/70  Pulse: 72  Temp: 98.4 F (36.9 C)  TempSrc: Tympanic  Resp: 16   There is no weight on file to calculate BMI.  Physical Exam  Constitutional: She is oriented to person, place, and time. She appears well-developed and well-nourished. No distress.  HENT:  Head: Normocephalic and atraumatic.  Eyes: Conjunctivae and EOM are normal. Pupils are equal, round, and reactive to light.  Neck: Normal range of motion. Neck supple. No JVD present. No thyromegaly present.  Cardiovascular: Normal rate, regular rhythm and normal heart sounds.   Pulmonary/Chest: Effort normal. She has decreased breath sounds. She has no wheezes. She has no rales.  Decreased breath sounds Bilat. Lungs.   Abdominal: Soft.  Bowel sounds are normal. There is no tenderness.  Musculoskeletal: Normal range of motion. She exhibits no edema or tenderness.  Lower back-baseline: pain is well controlled with prn inj, narcotics, w/c   Lymphadenopathy:    She has no cervical adenopathy.  Neurological: She is alert and oriented to person, place, and time. She has normal reflexes. No cranial nerve deficit. She exhibits normal muscle tone. Coordination normal.  Skin: Skin is warm and dry. No rash noted. She is not diaphoretic. No erythema (LLE).  The right hip surgical scar  Psychiatric: She has a normal mood and affect. Her affect is not angry, not blunt, not labile and not inappropriate. Her speech is not delayed, not tangential and not slurred. She is hyperactive and slowed. She is not agitated, not aggressive, not withdrawn and not combative. Thought content is not paranoid and not delusional. Cognition and memory are impaired. She does not express impulsivity or inappropriate judgment. She exhibits abnormal recent memory.    Labs reviewed: Basic Metabolic  Panel:  Recent Labs  10/16/14 0154 10/22/14  NA 143 142  K 3.6 3.9  CL 106  --   CO2 26  --   GLUCOSE 95  --   BUN 17 14  CREATININE 0.82 0.6  CALCIUM 9.4  --     Liver Function Tests:  Recent Labs  10/16/14 0154 10/22/14  AST 35 16  ALT 20 12  ALKPHOS 70 52  BILITOT 1.2  --   PROT 7.4  --   ALBUMIN 4.0  --     CBC:  Recent Labs  10/16/14 0154 10/22/14 11/05/14 12/28/14 12/28/14 1108  WBC 13.4* 6.5 6.9 8.9 8.9  NEUTROABS 10.9*  --   --   --  6.8  HGB 14.0 12.1 13.5 13.4  --   HCT 43.3 36 40 35* 39.8  MCV 108.5*  --   --   --   --   PLT 396 341 338 424*  --     Lab Results  Component Value Date   TSH 1.674 10/16/2014   Lab Results  Component Value Date   HGBA1C 5.5 10/16/2014   Lab Results  Component Value Date   CHOL 169 09/13/2011   HDL 62.80 09/13/2011   LDLCALC 79 09/13/2011   LDLDIRECT 152.3 06/04/2009   TRIG 137.0 09/13/2011   CHOLHDL 3 09/13/2011    Significant Diagnostic Results since last visit: none  Patient Care Team: Estill Dooms, MD as PCP - Internal Medicine (Internal Medicine) Annia Belt, MD as Consulting Physician (Hematology and Oncology) Suella Broad, MD as Consulting Physician (Physical Medicine and Rehabilitation) Noralee Space, MD (Pulmonary Disease) [place] Mahdiya Mossberg X, NP as Nurse Practitioner (Nurse Practitioner) Inda Castle, MD as Consulting Physician (Gastroenterology)  Assessment/Plan Problem List Items Addressed This Visit    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  -last flare up 07/2014      BACK PAIN, LUMBAR (Chronic)    Long standing spondylosis, prn inj and Norco 10/325 tid for pain control, w/c for mobility except a few steps with walker sometimes.       Dementia (Chronic)    11/25/14 MMSE 28/30, continue Namenda  28mg  daily       Achalasia of esophagus (Chronic)    09/18/13 Nifedipine 10mg  tid ac meals. 11/17/14 GI Deatra Ina  f/u as neeed Stable.       Essential hypertension, benign (Chronic)    Controlled, continue Metoprolol 25mg  bid.       GERD    Stable. Continue Omeprazole 20mg  daily. Complicated with achalasia, hx of dilatations.       Left leg DVT ([place])    Switched to [place] since 09/12/12. Left.       Anxiety and depression    Stable, continue Sertraline 25mg  since 10/12/14(GDR), off Mirtazapine. Continue prn Lorazepam.       Essential thrombocythemia ([place]) - Primary    F/u oncology, continue hydroxyurea 1000mg  q Monday, 500mg  Sun, Wed, Fri. Last Plt 424          Family/ staff Communication: continue SNF for care needs.   Labs/tests ordered:  none  ManXie Kensi Karr NP [facility] [address], [place] [address] On Call:  [phone] & follow prompts after 5pm & weekends Office Phone:  [phone] Office Fax:  [phone]

## 2015-01-28 NOTE — Assessment & Plan Note (Signed)
F/u oncology, continue hydroxyurea 1000mg  q Monday, 500mg  Sun, Wed, Fri. Last Plt 424

## 2015-01-28 NOTE — Assessment & Plan Note (Signed)
Stable. Continue Omeprazole 20mg  daily. Complicated with achalasia, hx of dilatations.

## 2015-01-28 NOTE — Assessment & Plan Note (Signed)
11/25/14 MMSE 28/30, continue Namenda 28mg  daily

## 2015-01-28 NOTE — Assessment & Plan Note (Addendum)
Stable, continue Sertraline 25mg  since 10/12/14(GDR), off Mirtazapine. Continue prn Lorazepam.

## 2015-01-28 NOTE — Assessment & Plan Note (Signed)
Long standing spondylosis, prn inj and Norco 10/325 tid for pain control, w/c for mobility except a few steps with walker sometimes.

## 2015-01-28 NOTE — Assessment & Plan Note (Signed)
Controlled, continue Metoprolol 25mg bid 

## 2015-01-28 NOTE — Assessment & Plan Note (Signed)
dc'd Epipen prn and Immunotherapy per  [facility] 01/17/13 --maintenance, takes Montelukast 10mg daily, Breo Ellipta I daily inhaled, Ventolin HFA 2 puffs q6h prn.  -last flare up 07/2014 

## 2015-01-28 NOTE — Assessment & Plan Note (Signed)
Switched to [place] since 09/12/12. Left   

## 2015-01-28 NOTE — Assessment & Plan Note (Signed)
09/18/13 Nifedipine 10mg  tid ac meals. 11/17/14 GI [facility] f/u as neeed Stable.

## 2015-02-23 ENCOUNTER — Non-Acute Institutional Stay (SKILLED_NURSING_FACILITY): Payer: Medicare Other | Admitting: Nurse Practitioner

## 2015-02-23 ENCOUNTER — Encounter: Payer: Self-pay | Admitting: Nurse Practitioner

## 2015-02-23 DIAGNOSIS — F0390 Unspecified dementia without behavioral disturbance: Secondary | ICD-10-CM

## 2015-02-23 DIAGNOSIS — I10 Essential (primary) hypertension: Secondary | ICD-10-CM

## 2015-02-23 DIAGNOSIS — F329 Major depressive disorder, single episode, unspecified: Secondary | ICD-10-CM

## 2015-02-23 DIAGNOSIS — I82592 Chronic embolism and thrombosis of other specified deep vein of left lower extremity: Secondary | ICD-10-CM | POA: Diagnosis not present

## 2015-02-23 DIAGNOSIS — J449 Chronic obstructive pulmonary disease, unspecified: Secondary | ICD-10-CM | POA: Diagnosis not present

## 2015-02-23 DIAGNOSIS — D473 Essential (hemorrhagic) thrombocythemia: Secondary | ICD-10-CM

## 2015-02-23 DIAGNOSIS — K220 Achalasia of cardia: Secondary | ICD-10-CM | POA: Diagnosis not present

## 2015-02-23 DIAGNOSIS — M5440 Lumbago with sciatica, unspecified side: Secondary | ICD-10-CM

## 2015-02-23 DIAGNOSIS — J45909 Unspecified asthma, uncomplicated: Secondary | ICD-10-CM | POA: Diagnosis not present

## 2015-02-23 DIAGNOSIS — F419 Anxiety disorder, unspecified: Secondary | ICD-10-CM

## 2015-02-23 DIAGNOSIS — F418 Other specified anxiety disorders: Secondary | ICD-10-CM | POA: Diagnosis not present

## 2015-02-23 DIAGNOSIS — F32A Depression, unspecified: Secondary | ICD-10-CM

## 2015-02-23 LAB — CBC AND DIFFERENTIAL
HEMATOCRIT: 38 % (ref 36–46)
Hemoglobin: 13.1 g/dL (ref 12.0–16.0)
PLATELETS: 334 10*3/uL (ref 150–399)
WBC: 7.6 10*3/mL

## 2015-02-23 NOTE — Assessment & Plan Note (Signed)
Switched to [place] since 09/12/12. Left   

## 2015-02-23 NOTE — Assessment & Plan Note (Signed)
dc'd Epipen prn and Immunotherapy per [place] [facility] 01/17/13 --maintenance, takes Montelukast 10mg daily, Breo Ellipta I daily inhaled, Ventolin HFA 2 puffs q6h prn.  -last flare up 07/2014 

## 2015-02-23 NOTE — Assessment & Plan Note (Signed)
F/u oncology, continue hydroxyurea 1000mg  q Monday, 500mg  Sun, Wed, Fri. Last Plt 334 02/23/15

## 2015-02-23 NOTE — Assessment & Plan Note (Signed)
09/18/13 Nifedipine 10mg tid ac meals. 11/17/14 GI [facility] f/u as neeed Stable. Continue Omeprazole, Nifedipine.    

## 2015-02-23 NOTE — Assessment & Plan Note (Signed)
Long standing spondylosis, prn inj and Norco 10/325 qid for pain control, w/c for mobility except a few steps with walker sometimes 

## 2015-02-23 NOTE — Assessment & Plan Note (Signed)
Her mood is stable, continue Sertraline 25mg  since 10/12/14(GDR), off Mirtazapine. Continue prn Lorazepam.

## 2015-02-23 NOTE — Assessment & Plan Note (Signed)
11/25/14 MMSE 28/30, continue Namenda 28mg  daily

## 2015-02-23 NOTE — Assessment & Plan Note (Signed)
Controlled, continue Metoprolol 25mg bid 

## 2015-02-23 NOTE — Progress Notes (Signed)
Patient ID: Sherri Crawford, female   DOB: 1920/05/24, 81 y.o.   MRN: [id number]  Location:  SNF [facility] Provider:  Marlana Latus NP  Code Status:  DNR Goals of care: Advanced Directive information    Chief Complaint  Patient presents with  . Medical Management of Chronic Issues     HPI: Patient is a 81 y.o. female seen in the SNF at [facility] today for evaluation of hx of dementia, has been stable on Namenda in SNF. GERD has been stable, hx of dilatations for achalasia, treated for candidiasis, f/u GI prn. Blood pressure has been controlled on Metoprolol. Hx of LLE DVT related to elevated Plt , on Eliquis chronically. Last exacerbation of COPD was 07/2014, otherwise she is stable with occasional chronic hacking cough.   Review of Systems  Constitutional: Negative for fever, chills and diaphoresis.  HENT: Positive for hearing loss. Negative for congestion, ear discharge and ear pain.   Eyes: Negative for pain, discharge and redness.  Respiratory: Negative for cough, shortness of breath and wheezing.   Cardiovascular: Negative for chest pain, palpitations and leg swelling.  Gastrointestinal: Negative for nausea, vomiting, abdominal pain and diarrhea.  Genitourinary: Positive for frequency. Negative for dysuria and urgency.  Musculoskeletal: Positive for back pain. Negative for myalgias and neck pain.       Lower extremity weakness. Rods wheelchair.  Skin: Negative for rash.  Neurological: Negative for dizziness, tremors, seizures, weakness and headaches.  Endo/Heme/Allergies: Does not bruise/bleed easily.  Psychiatric/Behavioral: Positive for memory loss. Negative for suicidal ideas and hallucinations. The patient is not nervous/anxious.     Past Medical History  Diagnosis Date  . Macular degeneration   . COPD (chronic obstructive pulmonary disease) ([place])   . Hypertension   . Coronary artery disease   . Hypercholesterolemia   . GERD (gastroesophageal reflux disease)   .  Asthmatic bronchitis   . History of colonic polyps   . DJD (degenerative joint disease)   . Lumbar back pain   . Osteoporosis   . History of transient ischemic attack (TIA)   . Anxiety   . Essential thrombocythemia ([place])   . Diverticulosis 05/10/2012  . Osteoarthrosis, unspecified whether generalized or localized, unspecified site 05/09/2012  . Memory loss 05/09/2012  . Abnormal weight gain 05/09/2012  . Phlebitis and thrombophlebitis of other deep vessels of lower extremities 07/10/2010  . Essential thrombocythemia ([place]) 06/09/2009  . Depression   . Esophageal stricture   . Achalasia 10/15/2014  . Essential thrombocythemia ([place]) 12/28/2014    1,000 mg on Mondays, 500 mg on Sundays, wednesdays, Fridays  Updated 12/28/14    Patient Active Problem List   Diagnosis Date Noted  . Essential thrombocythemia ([place]) 12/28/2014  . Essential hypertension, benign 10/30/2013  . Achalasia of esophagus 09/18/2013  . Loss of weight 08/27/2013  . S/P balloon dilatation of esophageal stricture 07/28/2013  . Candida esophagitis ([place]) 07/28/2013  . Esophageal dysmotility 07/26/2013  . Foreign body in esophagus 07/26/2013  . Fall 06/25/2013  . Dry skin dermatitis 11/18/2012  . Dementia 09/16/2012  . Hip fracture ([place]) 09/08/2012  . Deep vein thrombosis of left lower extremity ([place]) 07/19/2012  . Anxiety and depression 03/12/2012  . DVT (deep venous thrombosis) ([place]) 07/13/2010  . Edema of leg 07/01/2010  . Left leg DVT ([place]) 07/01/2010  . Esophageal dysphagia 05/27/2010  . SYNCOPE 07/30/2009  . MACULAR DEGENERATION 01/26/2008  . CORONARY ARTERY DISEASE 03/31/2007  . COLONIC POLYPS 03/12/2007  . HYPERCHOLESTEROLEMIA 03/12/2007  . COPD  with asthma ([place]) 03/12/2007  . GERD 03/12/2007  . DEGENERATIVE JOINT DISEASE 03/12/2007  . BACK PAIN, LUMBAR 03/12/2007  . Osteoporosis 03/12/2007  . TRANSIENT ISCHEMIC ATTACKS, HX OF 03/12/2007    Allergies  Allergen Reactions  . Sulfonamide Derivatives  Other (See Comments)    Pt is unsure of reaction    Medications: Patient's Medications  New Prescriptions   No medications on file  Previous Medications   ACETAMINOPHEN (TYLENOL) 325 MG TABLET    Take 650 mg by mouth every 4 (four) hours as needed.   ALBUTEROL (PROVENTIL HFA;VENTOLIN HFA) 108 (90 BASE) MCG/ACT INHALER    Inhale 2 puffs into the lungs every 6 (six) hours as needed for wheezing or shortness of breath.   ATORVASTATIN (LIPITOR) 20 MG TABLET    Take 20 mg by mouth every evening.    FEEDING SUPPLEMENT (RESOURCE BREEZE) LIQD    Take 90 mLs by mouth 2 (two) times daily between meals.    FLUTICASONE FUROATE-VILANTEROL (BREO ELLIPTA) 100-25 MCG/INH AEPB    Inhale 1 Dose into the lungs daily.   HYDROCODONE-ACETAMINOPHEN (NORCO) 10-325 MG PER TABLET    Take 1 tablet by mouth 4 (four) times daily.   HYDROXYUREA (HYDREA) 500 MG CAPSULE    Take 500-1,000 mg by mouth See admin instructions. Take 1000 mg on Monday and 500 mg on Sunday, Wednesday and Friday   LORAZEPAM (ATIVAN) 0.5 MG TABLET    Take 0.5 mg by mouth every 8 (eight) hours as needed for anxiety (and nausea).    MEMANTINE HCL ER (NAMENDA XR) 14 MG CP24    Take 14 mg by mouth daily.   METOPROLOL TARTRATE (LOPRESSOR) 25 MG TABLET    Take 25 mg by mouth 2 (two) times daily.   MONTELUKAST (SINGULAIR) 10 MG TABLET    Take 10 mg by mouth at bedtime.   MULTIPLE VITAMINS-MINERALS (PRESERVISION AREDS 2) CAPS    Take 1 capsule by mouth 2 (two) times daily.   NIFEDIPINE (PROCARDIA) 10 MG CAPSULE    Take 1 capsule (10 mg total) by mouth 3 (three) times daily before meals.   NITROGLYCERIN (NITROSTAT) 0.4 MG SL TABLET    Place 0.4 mg under the tongue every 5 (five) minutes as needed for chest pain.   OMEPRAZOLE (PRILOSEC) 20 MG CAPSULE    Take 20 mg by mouth at bedtime.    RIVAROXABAN (XARELTO) 15 MG TABS TABLET    Take 15 mg by mouth daily.   SERTRALINE (ZOLOFT) 25 MG TABLET    Take 25 mg by mouth daily.  Modified Medications   No  medications on file  Discontinued Medications   No medications on file    Physical Exam: Filed Vitals:   02/23/15 1230  BP: 126/70  Pulse: 70  Temp: 98.2 F (36.8 C)  TempSrc: Tympanic  Resp: 20   There is no weight on file to calculate BMI.  Physical Exam  Constitutional: She is oriented to person, place, and time. She appears well-developed and well-nourished. No distress.  HENT:  Head: Normocephalic and atraumatic.  Eyes: Conjunctivae and EOM are normal. Pupils are equal, round, and reactive to light.  Neck: Normal range of motion. Neck supple. No JVD present. No thyromegaly present.  Cardiovascular: Normal rate, regular rhythm and normal heart sounds.   Pulmonary/Chest: Effort normal. She has decreased breath sounds. She has no wheezes. She has no rales.  Decreased breath sounds Bilat. Lungs.   Abdominal: Soft. Bowel sounds are normal. There is no tenderness.  Musculoskeletal: Normal range of motion. She exhibits no edema or tenderness.  Lower back-baseline: pain is well controlled with prn inj, narcotics, w/c   Lymphadenopathy:    She has no cervical adenopathy.  Neurological: She is alert and oriented to person, place, and time. She has normal reflexes. No cranial nerve deficit. She exhibits normal muscle tone. Coordination normal.  Skin: Skin is warm and dry. No rash noted. She is not diaphoretic. No erythema (LLE).  The right hip surgical scar  Psychiatric: She has a normal mood and affect. Her affect is not angry, not blunt, not labile and not inappropriate. Her speech is not delayed, not tangential and not slurred. She is hyperactive and slowed. She is not agitated, not aggressive, not withdrawn and not combative. Thought content is not paranoid and not delusional. Cognition and memory are impaired. She does not express impulsivity or inappropriate judgment. She exhibits abnormal recent memory.    Labs reviewed: Basic Metabolic Panel:  Recent Labs  10/16/14 0154  10/22/14  NA 143 142  K 3.6 3.9  CL 106  --   CO2 26  --   GLUCOSE 95  --   BUN 17 14  CREATININE 0.82 0.6  CALCIUM 9.4  --     Liver Function Tests:  Recent Labs  10/16/14 0154 10/22/14  AST 35 16  ALT 20 12  ALKPHOS 70 52  BILITOT 1.2  --   PROT 7.4  --   ALBUMIN 4.0  --     CBC:  Recent Labs  10/16/14 0154  11/05/14 12/28/14 12/28/14 1108 02/23/15  WBC 13.4*  < > 6.9 8.9 8.9 7.6  NEUTROABS 10.9*  --   --   --  6.8  --   HGB 14.0  < > 13.5 13.4  --  13.1  HCT 43.3  < > 40 35* 39.8 38  MCV 108.5*  --   --   --   --   --   PLT 396  < > 338 424*  --  334  < > = values in this interval not displayed.  Lab Results  Component Value Date   TSH 1.674 10/16/2014   Lab Results  Component Value Date   HGBA1C 5.5 10/16/2014   Lab Results  Component Value Date   CHOL 169 09/13/2011   HDL 62.80 09/13/2011   LDLCALC 79 09/13/2011   LDLDIRECT 152.3 06/04/2009   TRIG 137.0 09/13/2011   CHOLHDL 3 09/13/2011    Significant Diagnostic Results since last visit: none  Patient Care Team: Estill Dooms, MD as PCP - Internal Medicine (Internal Medicine) Annia Belt, MD as Consulting Physician (Hematology and Oncology) Suella Broad, MD as Consulting Physician (Physical Medicine and Rehabilitation) Noralee Space, MD (Pulmonary Disease) [facility] Shiquan Mathieu Rhunette Croft, NP as Nurse Practitioner (Nurse Practitioner) Inda Castle, MD as Consulting Physician (Gastroenterology)  Assessment/Plan Problem List Items Addressed This Visit    Essential thrombocythemia [facility]) - Primary    F/u oncology, continue hydroxyurea 1000mg  q Monday, 500mg  Sun, Wed, Fri. Last Plt 334 02/23/15      Essential hypertension, benign (Chronic)    Controlled, continue Metoprolol 25mg  bid.       DVT (deep venous thrombosis) ([place])    Switched to [place] since 09/12/12. Left.       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  -last flare up 07/2014      BACK PAIN, LUMBAR (Chronic)    Long standing spondylosis, prn inj and Norco 10/325 qid for pain control, w/c for mobility except a few steps with walker sometimes.       Anxiety and depression    Her mood is stable, continue Sertraline 25mg  since 10/12/14(GDR), off Mirtazapine. Continue prn Lorazepam.       Achalasia of esophagus (Chronic)    09/18/13 Nifedipine 10mg  tid ac meals. 11/17/14 [facility] f/u as neeed Stable. Continue Omeprazole, Nifedipine.           Family/ staff Communication: continue SNF for care needs.   Labs/tests ordered:  CBC done 02/23/15  [facility] Sallee Hogrefe NP Geriatrics [facility] [address], [place] [address] On Call:  [phone] & follow prompts after 5pm & weekends Office Phone:  [phone] Office Fax:  [phone]

## 2015-03-15 ENCOUNTER — Encounter: Payer: Self-pay | Admitting: Nurse Practitioner

## 2015-03-15 ENCOUNTER — Non-Acute Institutional Stay (SKILLED_NURSING_FACILITY): Payer: Medicare Other | Admitting: Nurse Practitioner

## 2015-03-15 DIAGNOSIS — F32A Depression, unspecified: Secondary | ICD-10-CM

## 2015-03-15 DIAGNOSIS — J45909 Unspecified asthma, uncomplicated: Secondary | ICD-10-CM

## 2015-03-15 DIAGNOSIS — K220 Achalasia of cardia: Secondary | ICD-10-CM

## 2015-03-15 DIAGNOSIS — F418 Other specified anxiety disorders: Secondary | ICD-10-CM | POA: Diagnosis not present

## 2015-03-15 DIAGNOSIS — F0390 Unspecified dementia without behavioral disturbance: Secondary | ICD-10-CM | POA: Diagnosis not present

## 2015-03-15 DIAGNOSIS — J449 Chronic obstructive pulmonary disease, unspecified: Secondary | ICD-10-CM | POA: Diagnosis not present

## 2015-03-15 DIAGNOSIS — I82409 Acute embolism and thrombosis of unspecified deep veins of unspecified lower extremity: Secondary | ICD-10-CM | POA: Diagnosis not present

## 2015-03-15 DIAGNOSIS — I10 Essential (primary) hypertension: Secondary | ICD-10-CM

## 2015-03-15 DIAGNOSIS — F419 Anxiety disorder, unspecified: Secondary | ICD-10-CM

## 2015-03-15 DIAGNOSIS — F329 Major depressive disorder, single episode, unspecified: Secondary | ICD-10-CM

## 2015-03-15 DIAGNOSIS — M5440 Lumbago with sciatica, unspecified side: Secondary | ICD-10-CM

## 2015-03-15 DIAGNOSIS — D473 Essential (hemorrhagic) thrombocythemia: Secondary | ICD-10-CM | POA: Diagnosis not present

## 2015-03-15 NOTE — Assessment & Plan Note (Signed)
Controlled, continue Metoprolol 25mg bid 

## 2015-03-15 NOTE — Assessment & Plan Note (Signed)
F/u oncology, continue hydroxyurea 1000mg  q Monday, 500mg  Sun, Wed, Fri. Last Plt 334 02/23/15

## 2015-03-15 NOTE — Assessment & Plan Note (Signed)
Switched to [place] since 09/12/12. Left   

## 2015-03-15 NOTE — Assessment & Plan Note (Signed)
09/18/13 Nifedipine 10mg tid ac meals. 11/17/14 GI [facility] f/u as neeed Stable. Continue Omeprazole, Nifedipine.    

## 2015-03-15 NOTE — Assessment & Plan Note (Signed)
dc'd Epipen prn and Immunotherapy per  [facility] 01/17/13 --maintenance, takes Montelukast 10mg daily, Breo Ellipta I daily inhaled, Ventolin HFA 2 puffs q6h prn.  -last flare up 07/2014 

## 2015-03-15 NOTE — Assessment & Plan Note (Signed)
11/25/14 MMSE 28/30, continue Namenda 28mg  daily

## 2015-03-15 NOTE — Assessment & Plan Note (Signed)
Her mood is stable, continue Sertraline 25mg  since 10/12/14(GDR), off Mirtazapine. Continue prn Lorazepam.

## 2015-03-15 NOTE — Progress Notes (Signed)
Patient ID: Sherri Crawford, female   DOB: Feb 18, 1921, 82 y.o.   MRN: [id number]  Location:  SNF [facility] Provider:  Marlana Latus NP  Code Status:  DNR Goals of care: Advanced Directive information    Chief Complaint  Patient presents with  . Medical Management of Chronic Issues     HPI: Patient is a 82 y.o. female seen in the SNF at [facility] today for evaluation of hx of dementia, has been stable on Namenda in SNF. GERD has been stable, hx of dilatations for achalasia, treated for candidiasis, f/u GI prn. Blood pressure has been controlled on Metoprolol. Hx of LLE DVT related to elevated Plt , on Eliquis chronically. Last exacerbation of COPD was 07/2014, otherwise she is stable with occasional chronic hacking cough.   Review of Systems  Constitutional: Negative for fever, chills and diaphoresis.  HENT: Positive for hearing loss. Negative for congestion, ear discharge and ear pain.   Eyes: Negative for pain, discharge and redness.  Respiratory: Negative for cough, shortness of breath and wheezing.   Cardiovascular: Negative for chest pain, palpitations and leg swelling.  Gastrointestinal: Negative for nausea, vomiting, abdominal pain and diarrhea.  Genitourinary: Positive for frequency. Negative for dysuria and urgency.  Musculoskeletal: Positive for back pain. Negative for myalgias and neck pain.       Lower extremity weakness. Rods wheelchair.  Skin: Negative for rash.  Neurological: Negative for dizziness, tremors, seizures, weakness and headaches.  Endo/Heme/Allergies: Does not bruise/bleed easily.  Psychiatric/Behavioral: Positive for memory loss. Negative for suicidal ideas and hallucinations. The patient is not nervous/anxious.     Past Medical History  Diagnosis Date  . Macular degeneration   . COPD (chronic obstructive pulmonary disease) ([place])   . Hypertension   . Coronary artery disease   . Hypercholesterolemia   . GERD (gastroesophageal reflux disease)   .  Asthmatic bronchitis   . History of colonic polyps   . DJD (degenerative joint disease)   . Lumbar back pain   . Osteoporosis   . History of transient ischemic attack (TIA)   . Anxiety   . Essential thrombocythemia ([place])   . Diverticulosis 05/10/2012  . Osteoarthrosis, unspecified whether generalized or localized, unspecified site 05/09/2012  . Memory loss 05/09/2012  . Abnormal weight gain 05/09/2012  . Phlebitis and thrombophlebitis of other deep vessels of lower extremities 07/10/2010  . Essential thrombocythemia ([place]) 06/09/2009  . Depression   . Esophageal stricture   . Achalasia 10/15/2014  . Essential thrombocythemia ([place]) 12/28/2014    1,000 mg on Mondays, 500 mg on Sundays, wednesdays, Fridays  Updated 12/28/14    Patient Active Problem List   Diagnosis Date Noted  . Essential thrombocythemia ([place]) 12/28/2014  . Essential hypertension, benign 10/30/2013  . Achalasia of esophagus 09/18/2013  . Loss of weight 08/27/2013  . S/P balloon dilatation of esophageal stricture 07/28/2013  . Candida esophagitis ([place]) 07/28/2013  . Esophageal dysmotility 07/26/2013  . Foreign body in esophagus 07/26/2013  . Fall 06/25/2013  . Dry skin dermatitis 11/18/2012  . Dementia 09/16/2012  . Hip fracture ([place]) 09/08/2012  . Deep vein thrombosis of left lower extremity ([place]) 07/19/2012  . Anxiety and depression 03/12/2012  . DVT (deep venous thrombosis) ([place]) 07/13/2010  . Edema of leg 07/01/2010  . Left leg DVT ([place]) 07/01/2010  . Esophageal dysphagia 05/27/2010  . SYNCOPE 07/30/2009  . MACULAR DEGENERATION 01/26/2008  . CORONARY ARTERY DISEASE 03/31/2007  . COLONIC POLYPS 03/12/2007  . HYPERCHOLESTEROLEMIA 03/12/2007  . COPD  with asthma ([place]) 03/12/2007  . GERD 03/12/2007  . DEGENERATIVE JOINT DISEASE 03/12/2007  . BACK PAIN, LUMBAR 03/12/2007  . Osteoporosis 03/12/2007  . TRANSIENT ISCHEMIC ATTACKS, HX OF 03/12/2007    Allergies  Allergen Reactions  . Sulfonamide Derivatives  Other (See Comments)    Pt is unsure of reaction    Medications: Patient's Medications  New Prescriptions   No medications on file  Previous Medications   ACETAMINOPHEN (TYLENOL) 325 MG TABLET    Take 650 mg by mouth every 4 (four) hours as needed.   ALBUTEROL (PROVENTIL HFA;VENTOLIN HFA) 108 (90 BASE) MCG/ACT INHALER    Inhale 2 puffs into the lungs every 6 (six) hours as needed for wheezing or shortness of breath.   ATORVASTATIN (LIPITOR) 20 MG TABLET    Take 20 mg by mouth every evening.    FEEDING SUPPLEMENT (RESOURCE BREEZE) LIQD    Take 90 mLs by mouth 2 (two) times daily between meals.    FLUTICASONE FUROATE-VILANTEROL (BREO ELLIPTA) 100-25 MCG/INH AEPB    Inhale 1 Dose into the lungs daily.   HYDROCODONE-ACETAMINOPHEN (NORCO) 10-325 MG PER TABLET    Take 1 tablet by mouth 4 (four) times daily.   HYDROXYUREA (HYDREA) 500 MG CAPSULE    Take 500-1,000 mg by mouth See admin instructions. Take 1000 mg on Monday and 500 mg on Sunday, Wednesday and Friday   LORAZEPAM (ATIVAN) 0.5 MG TABLET    Take 0.5 mg by mouth every 8 (eight) hours as needed for anxiety (and nausea).    MEMANTINE HCL ER (NAMENDA XR) 14 MG CP24    Take 14 mg by mouth daily.   METOPROLOL TARTRATE (LOPRESSOR) 25 MG TABLET    Take 25 mg by mouth 2 (two) times daily.   MONTELUKAST (SINGULAIR) 10 MG TABLET    Take 10 mg by mouth at bedtime.   MULTIPLE VITAMINS-MINERALS (PRESERVISION AREDS 2) CAPS    Take 1 capsule by mouth 2 (two) times daily.   NIFEDIPINE (PROCARDIA) 10 MG CAPSULE    Take 1 capsule (10 mg total) by mouth 3 (three) times daily before meals.   NITROGLYCERIN (NITROSTAT) 0.4 MG SL TABLET    Place 0.4 mg under the tongue every 5 (five) minutes as needed for chest pain.   OMEPRAZOLE (PRILOSEC) 20 MG CAPSULE    Take 20 mg by mouth at bedtime.    RIVAROXABAN (XARELTO) 15 MG TABS TABLET    Take 15 mg by mouth daily.   SERTRALINE (ZOLOFT) 25 MG TABLET    Take 25 mg by mouth daily.  Modified Medications   No  medications on file  Discontinued Medications   No medications on file    Physical Exam: Filed Vitals:   03/15/15 1553  BP: 130/80  Pulse: 78  Temp: 98.7 F (37.1 C)  TempSrc: Tympanic  Resp: 18   There is no weight on file to calculate BMI.  Physical Exam  Constitutional: She is oriented to person, place, and time. She appears well-developed and well-nourished. No distress.  HENT:  Head: Normocephalic and atraumatic.  Eyes: Conjunctivae and EOM are normal. Pupils are equal, round, and reactive to light.  Neck: Normal range of motion. Neck supple. No JVD present. No thyromegaly present.  Cardiovascular: Normal rate, regular rhythm and normal heart sounds.   Pulmonary/Chest: Effort normal. She has decreased breath sounds. She has no wheezes. She has no rales.  Decreased breath sounds Bilat. Lungs.   Abdominal: Soft. Bowel sounds are normal. There is no tenderness.  Musculoskeletal: Normal range of motion. She exhibits no edema or tenderness.  Lower back-baseline: pain is well controlled with prn inj, narcotics, w/c   Lymphadenopathy:    She has no cervical adenopathy.  Neurological: She is alert and oriented to person, place, and time. She has normal reflexes. No cranial nerve deficit. She exhibits normal muscle tone. Coordination normal.  Skin: Skin is warm and dry. No rash noted. She is not diaphoretic. No erythema (LLE).  The right hip surgical scar  Psychiatric: She has a normal mood and affect. Her affect is not angry, not blunt, not labile and not inappropriate. Her speech is not delayed, not tangential and not slurred. She is hyperactive and slowed. She is not agitated, not aggressive, not withdrawn and not combative. Thought content is not paranoid and not delusional. Cognition and memory are impaired. She does not express impulsivity or inappropriate judgment. She exhibits abnormal recent memory.    Labs reviewed: Basic Metabolic Panel:  Recent Labs  10/16/14 0154  10/22/14  NA 143 142  K 3.6 3.9  CL 106  --   CO2 26  --   GLUCOSE 95  --   BUN 17 14  CREATININE 0.82 0.6  CALCIUM 9.4  --     Liver Function Tests:  Recent Labs  10/16/14 0154 10/22/14  AST 35 16  ALT 20 12  ALKPHOS 70 52  BILITOT 1.2  --   PROT 7.4  --   ALBUMIN 4.0  --     CBC:  Recent Labs  10/16/14 0154  11/05/14 12/28/14 12/28/14 1108 02/23/15  WBC 13.4*  < > 6.9 8.9 8.9 7.6  NEUTROABS 10.9*  --   --   --  6.8  --   HGB 14.0  < > 13.5 13.4  --  13.1  HCT 43.3  < > 40 35* 39.8 38  MCV 108.5*  --   --   --  103*  --   PLT 396  < > 338 424* 424* 334  < > = values in this interval not displayed.  Lab Results  Component Value Date   TSH 1.674 10/16/2014   Lab Results  Component Value Date   HGBA1C 5.5 10/16/2014   Lab Results  Component Value Date   CHOL 169 09/13/2011   HDL 62.80 09/13/2011   LDLCALC 79 09/13/2011   LDLDIRECT 152.3 06/04/2009   TRIG 137.0 09/13/2011   CHOLHDL 3 09/13/2011    Significant Diagnostic Results since last visit: none  Patient Care Team: Estill Dooms, MD as PCP - Internal Medicine (Internal Medicine) Annia Belt, MD as Consulting Physician (Hematology and Oncology) Suella Broad, MD as Consulting Physician (Physical Medicine and Rehabilitation) Noralee Space, MD (Pulmonary Disease) [facility] Nickalous Stingley Rhunette Croft, NP as Nurse Practitioner (Nurse Practitioner) Inda Castle, MD as Consulting Physician (Gastroenterology)  Assessment/Plan Problem List Items Addressed This Visit    Essential thrombocythemia [facility])    F/u oncology, continue hydroxyurea 1000mg  q Monday, 500mg  Sun, Wed, Fri. Last Plt 334 02/23/15      Essential hypertension, benign (Chronic)    Controlled, continue Metoprolol 25mg  bid.      DVT (deep venous thrombosis) ([place])    Switched to [place] since 09/12/12. Left.       Dementia (Chronic)    11/25/14 MMSE 28/30, continue Namenda 28mg  daily       COPD with asthma ([place]) (Chronic)      dc'd Epipen prn and Immunotherapy per [place] [facility]  01/17/13 --maintenance, takes Montelukast 10mg  daily, Breo Ellipta I daily inhaled, Ventolin HFA 2 puffs q6h prn.  -last flare up 07/2014      BACK PAIN, LUMBAR (Chronic)    Long standing spondylosis, prn inj and Norco 10/325 qid for pain control, w/c for mobility except a few steps with walker sometimes.       Anxiety and depression    Her mood is stable, continue Sertraline 25mg  since 10/12/14(GDR), off Mirtazapine. Continue prn Lorazepam.      Achalasia of esophagus - Primary (Chronic)    09/18/13 Nifedipine 10mg  tid ac meals. 11/17/14 [facility] f/u as neeed Stable. Continue Omeprazole, Nifedipine.           Family/ staff Communication: continue SNF for care needs.   Labs/tests ordered:  CBC done 02/23/15  [facility] Peony Barner NP Geriatrics [facility] [address], [place] [address] On Call:  [phone] & follow prompts after 5pm & weekends Office Phone:  [phone] Office Fax:  [phone]

## 2015-03-15 NOTE — Assessment & Plan Note (Signed)
Long standing spondylosis, prn inj and Norco 10/325 qid for pain control, w/c for mobility except a few steps with walker sometimes 

## 2015-04-07 ENCOUNTER — Non-Acute Institutional Stay (SKILLED_NURSING_FACILITY): Payer: Medicare Other | Admitting: Nurse Practitioner

## 2015-04-07 ENCOUNTER — Encounter: Payer: Self-pay | Admitting: Nurse Practitioner

## 2015-04-07 DIAGNOSIS — F418 Other specified anxiety disorders: Secondary | ICD-10-CM | POA: Diagnosis not present

## 2015-04-07 DIAGNOSIS — F0390 Unspecified dementia without behavioral disturbance: Secondary | ICD-10-CM

## 2015-04-07 DIAGNOSIS — K220 Achalasia of cardia: Secondary | ICD-10-CM | POA: Diagnosis not present

## 2015-04-07 DIAGNOSIS — F329 Major depressive disorder, single episode, unspecified: Secondary | ICD-10-CM

## 2015-04-07 DIAGNOSIS — I825Z2 Chronic embolism and thrombosis of unspecified deep veins of left distal lower extremity: Secondary | ICD-10-CM | POA: Diagnosis not present

## 2015-04-07 DIAGNOSIS — I10 Essential (primary) hypertension: Secondary | ICD-10-CM

## 2015-04-07 DIAGNOSIS — D473 Essential (hemorrhagic) thrombocythemia: Secondary | ICD-10-CM | POA: Diagnosis not present

## 2015-04-07 DIAGNOSIS — G8929 Other chronic pain: Secondary | ICD-10-CM

## 2015-04-07 DIAGNOSIS — F32A Depression, unspecified: Secondary | ICD-10-CM

## 2015-04-07 DIAGNOSIS — F419 Anxiety disorder, unspecified: Secondary | ICD-10-CM

## 2015-04-07 DIAGNOSIS — M545 Low back pain: Secondary | ICD-10-CM | POA: Diagnosis not present

## 2015-04-07 DIAGNOSIS — K219 Gastro-esophageal reflux disease without esophagitis: Secondary | ICD-10-CM | POA: Diagnosis not present

## 2015-04-12 NOTE — Assessment & Plan Note (Signed)
11/25/14 MMSE 28/30, continue Namenda 28mg  daily

## 2015-04-12 NOTE — Progress Notes (Signed)
Patient ID: Sherri Crawford, female   DOB: 04-29-1920, 82 y.o.   MRN: [id number]  Location:  SNF [facility] Provider:  Marlana Latus NP  Code Status:  DNR Goals of care: Advanced Directive information Does patient have an advance directive?: Yes, Type of Advance Directive: [place];Out of facility DNR (pink MOST or yellow form);Living will  Chief Complaint  Patient presents with  . Medical Management of Chronic Issues    Routine Visit     HPI: Patient is a 82 y.o. female seen in the SNF at [facility] today for evaluation of hx of dementia, has been stable on Namenda in SNF. GERD has been stable, hx of dilatations for achalasia, treated for candidiasis, f/u GI prn. Blood pressure has been controlled on Metoprolol. Hx of LLE DVT related to elevated Plt , on Eliquis chronically. Last exacerbation of COPD was 07/2014, otherwise she is stable with occasional chronic hacking cough.   Review of Systems  Constitutional: Negative for fever, chills and diaphoresis.  HENT: Positive for hearing loss. Negative for congestion, ear discharge and ear pain.   Eyes: Negative for pain, discharge and redness.  Respiratory: Negative for cough, shortness of breath and wheezing.   Cardiovascular: Negative for chest pain, palpitations and leg swelling.  Gastrointestinal: Negative for nausea, vomiting, abdominal pain and diarrhea.  Genitourinary: Positive for frequency. Negative for dysuria and urgency.  Musculoskeletal: Positive for back pain. Negative for myalgias and neck pain.       Lower extremity weakness. Rods wheelchair.  Skin: Negative for rash.  Neurological: Negative for dizziness, tremors, seizures, weakness and headaches.  Endo/Heme/Allergies: Does not bruise/bleed easily.  Psychiatric/Behavioral: Positive for memory loss. Negative for suicidal ideas and hallucinations. The patient is not nervous/anxious.     Past Medical History  Diagnosis Date  . Macular degeneration     . COPD (chronic obstructive pulmonary disease) ([place])   . Hypertension   . Coronary artery disease   . Hypercholesterolemia   . GERD (gastroesophageal reflux disease)   . Asthmatic bronchitis   . History of colonic polyps   . DJD (degenerative joint disease)   . Lumbar back pain   . Osteoporosis   . History of transient ischemic attack (TIA)   . Anxiety   . Essential thrombocythemia ([place])   . Diverticulosis 05/10/2012  . Osteoarthrosis, unspecified whether generalized or localized, unspecified site 05/09/2012  . Memory loss 05/09/2012  . Abnormal weight gain 05/09/2012  . Phlebitis and thrombophlebitis of other deep vessels of lower extremities 07/10/2010  . Essential thrombocythemia ([place]) 06/09/2009  . Depression   . Esophageal stricture   . Achalasia 10/15/2014  . Essential thrombocythemia ([place]) 12/28/2014    1,000 mg on Mondays, 500 mg on Sundays, wednesdays, Fridays  Updated 12/28/14    Patient Active Problem List   Diagnosis Date Noted  . Essential thrombocythemia ([place]) 12/28/2014  . Essential hypertension, benign 10/30/2013  . Achalasia of esophagus 09/18/2013  . Loss of weight 08/27/2013  . S/P balloon dilatation of esophageal stricture 07/28/2013  . Candida esophagitis ([place]) 07/28/2013  . Esophageal dysmotility 07/26/2013  . Foreign body in esophagus 07/26/2013  . Fall 06/25/2013  . Dry skin dermatitis 11/18/2012  . Dementia 09/16/2012  . Hip fracture ([place]) 09/08/2012  . Deep vein thrombosis of left lower extremity ([place]) 07/19/2012  . Anxiety and depression 03/12/2012  . DVT (deep venous thrombosis) ([place]) 07/13/2010  . Edema of leg 07/01/2010  . Left leg DVT ([place]) 07/01/2010  . Esophageal dysphagia  05/27/2010  . SYNCOPE 07/30/2009  . MACULAR DEGENERATION 01/26/2008  . CORONARY ARTERY DISEASE 03/31/2007  . COLONIC POLYPS 03/12/2007  . HYPERCHOLESTEROLEMIA 03/12/2007  . COPD with asthma ([place]) 03/12/2007  . GERD 03/12/2007  . DEGENERATIVE JOINT DISEASE 03/12/2007   . BACK PAIN, LUMBAR 03/12/2007  . Osteoporosis 03/12/2007  . TRANSIENT ISCHEMIC ATTACKS, HX OF 03/12/2007    Allergies  Allergen Reactions  . Sulfonamide Derivatives Other (See Comments)    Pt is unsure of reaction    Medications: Patient's Medications  New Prescriptions   No medications on file  Previous Medications   ACETAMINOPHEN (TYLENOL) 325 MG TABLET    Take 650 mg by mouth every 4 (four) hours as needed.   ALBUTEROL (PROVENTIL HFA;VENTOLIN HFA) 108 (90 BASE) MCG/ACT INHALER    Inhale 2 puffs into the lungs every 6 (six) hours as needed for wheezing or shortness of breath.   ATORVASTATIN (LIPITOR) 20 MG TABLET    Take 20 mg by mouth every evening.    FEEDING SUPPLEMENT (RESOURCE BREEZE) LIQD    Take 90 mLs by mouth 2 (two) times daily between meals.    FLUTICASONE FUROATE-VILANTEROL (BREO ELLIPTA) 100-25 MCG/INH AEPB    Inhale 1 Dose into the lungs daily.   HYDROCODONE-ACETAMINOPHEN (NORCO) 10-325 MG PER TABLET    Take 1 tablet by mouth 4 (four) times daily.   HYDROXYUREA (HYDREA) 500 MG CAPSULE    Take 500-1,000 mg by mouth See admin instructions. Take 1000 mg on Monday and 500 mg on Sunday, Wednesday and Friday   LORAZEPAM (ATIVAN) 0.5 MG TABLET    Take 0.5 mg by mouth every 8 (eight) hours as needed for anxiety (and nausea).    MEMANTINE (NAMENDA XR) 28 MG CP24 24 HR CAPSULE    Take 28 mg by mouth daily.   METOPROLOL TARTRATE (LOPRESSOR) 25 MG TABLET    Take 25 mg by mouth 2 (two) times daily.   MONTELUKAST (SINGULAIR) 10 MG TABLET    Take 10 mg by mouth at bedtime.   MULTIPLE VITAMINS-MINERALS (PRESERVISION AREDS 2) CAPS    Take 1 capsule by mouth 2 (two) times daily.   NIFEDIPINE (PROCARDIA) 10 MG CAPSULE    Take 1 capsule (10 mg total) by mouth 3 (three) times daily before meals.   NITROGLYCERIN (NITROSTAT) 0.4 MG SL TABLET    Place 0.4 mg under the tongue every 5 (five) minutes as needed for chest pain.   OMEPRAZOLE (PRILOSEC) 20 MG CAPSULE    Take 20 mg by mouth at  bedtime.    RIVAROXABAN (XARELTO) 15 MG TABS TABLET    Take 15 mg by mouth daily.   SERTRALINE (ZOLOFT) 25 MG TABLET    Take 25 mg by mouth daily.  Modified Medications   No medications on file  Discontinued Medications   MEMANTINE HCL ER (NAMENDA XR) 14 MG CP24    Take 14 mg by mouth daily. Reported on 04/07/2015    Physical Exam: Filed Vitals:   04/07/15 0845  BP: 135/67  Pulse: 72  Temp: 96.7 F (35.9 C)  TempSrc: Oral  Resp: 22  Height: 4\' 9"  (1.448 m)  Weight: 111 lb 6.4 oz (50.531 kg)   Body mass index is 24.1 kg/(m^2).  Physical Exam  Constitutional: She is oriented to person, place, and time. She appears well-developed and well-nourished. No distress.  HENT:  Head: Normocephalic and atraumatic.  Eyes: Conjunctivae and EOM are normal. Pupils are equal, round, and reactive to light.  Neck: Normal range  of motion. Neck supple. No JVD present. No thyromegaly present.  Cardiovascular: Normal rate, regular rhythm and normal heart sounds.   Pulmonary/Chest: Effort normal. She has decreased breath sounds. She has no wheezes. She has no rales.  Decreased breath sounds Bilat. Lungs.   Abdominal: Soft. Bowel sounds are normal. There is no tenderness.  Musculoskeletal: Normal range of motion. She exhibits no edema or tenderness.  Lower back-baseline: pain is well controlled with prn inj, narcotics, w/c   Lymphadenopathy:    She has no cervical adenopathy.  Neurological: She is alert and oriented to person, place, and time. She has normal reflexes. No cranial nerve deficit. She exhibits normal muscle tone. Coordination normal.  Skin: Skin is warm and dry. No rash noted. She is not diaphoretic. No erythema (LLE).  The right hip surgical scar  Psychiatric: She has a normal mood and affect. Her affect is not angry, not blunt, not labile and not inappropriate. Her speech is not delayed, not tangential and not slurred. She is hyperactive and slowed. She is not agitated, not aggressive,  not withdrawn and not combative. Thought content is not paranoid and not delusional. Cognition and memory are impaired. She does not express impulsivity or inappropriate judgment. She exhibits abnormal recent memory.    Labs reviewed: Basic Metabolic Panel:  Recent Labs  10/16/14 0154 10/22/14  NA 143 142  K 3.6 3.9  CL 106  --   CO2 26  --   GLUCOSE 95  --   BUN 17 14  CREATININE 0.82 0.6  CALCIUM 9.4  --     Liver Function Tests:  Recent Labs  10/16/14 0154 10/22/14  AST 35 16  ALT 20 12  ALKPHOS 70 52  BILITOT 1.2  --   PROT 7.4  --   ALBUMIN 4.0  --     CBC:  Recent Labs  10/16/14 0154  11/05/14 12/28/14 12/28/14 1108 02/23/15  WBC 13.4*  < > 6.9 8.9 8.9 7.6  NEUTROABS 10.9*  --   --   --  6.8  --   HGB 14.0  < > 13.5 13.4  --  13.1  HCT 43.3  < > 40 35* 39.8 38  MCV 108.5*  --   --   --  103*  --   PLT 396  < > 338 424* 424* 334  < > = values in this interval not displayed.  Lab Results  Component Value Date   TSH 1.674 10/16/2014   Lab Results  Component Value Date   HGBA1C 5.5 10/16/2014   Lab Results  Component Value Date   CHOL 169 09/13/2011   HDL 62.80 09/13/2011   LDLCALC 79 09/13/2011   LDLDIRECT 152.3 06/04/2009   TRIG 137.0 09/13/2011   CHOLHDL 3 09/13/2011    Significant Diagnostic Results since last visit: none  Patient Care Team: Estill Dooms, MD as PCP - Internal Medicine (Internal Medicine) Annia Belt, MD as Consulting Physician (Hematology and Oncology) Suella Broad, MD as Consulting Physician (Physical Medicine and Rehabilitation) Noralee Space, MD (Pulmonary Disease) [facility] Desera Graffeo X, NP as Nurse Practitioner (Nurse Practitioner) Inda Castle, MD as Consulting Physician (Gastroenterology)  Assessment/Plan Problem List Items Addressed This Visit    BACK PAIN, LUMBAR - Primary (Chronic)    Long standing spondylosis, prn inj and Norco 10/325 qid for pain control, w/c for mobility except a  few steps with walker sometimes.       Dementia (Chronic)  11/25/14 MMSE 28/30, continue Namenda 28mg  daily       Relevant Medications   memantine (NAMENDA XR) 28 MG CP24 24 hr capsule   Achalasia of esophagus (Chronic)    09/18/13 Nifedipine 10mg  tid ac meals. 11/17/14 [facility] f/u as neeed Stable. Continue Omeprazole, Nifedipine.       Essential hypertension, benign (Chronic)    Controlled, continue Metoprolol 25mg  bid.      GERD    Stable. Continue Omeprazole 20mg  daily. Complicated with achalasia, hx of dilatations.       DVT (deep venous thrombosis) ([place])    Switched to [place] since 09/12/12. Left.       Anxiety and depression    Her mood is stable, continue Sertraline 25mg  since 10/12/14(GDR), off Mirtazapine. Continue prn Lorazepam      Essential thrombocythemia ([place])    F/u oncology, continue hydroxyurea 1000mg  q Monday, 500mg  Sun, Wed, Fri. Last Plt 334 02/23/15          Family/ staff Communication: continue SNF for care needs.   Labs/tests ordered:  CBC done 02/23/15  [facility] Dhana Totton NP Geriatrics [facility] [address], [address] [address] On Call:  [phone] & follow prompts after 5pm & weekends Office Phone:  [phone] Office Fax:  [phone]

## 2015-04-12 NOTE — Assessment & Plan Note (Signed)
F/u oncology, continue hydroxyurea 1000mg  q Monday, 500mg  Sun, Wed, Fri. Last Plt 334 02/23/15

## 2015-04-12 NOTE — Assessment & Plan Note (Signed)
Controlled, continue Metoprolol 25mg bid 

## 2015-04-12 NOTE — Assessment & Plan Note (Signed)
09/18/13 Nifedipine 10mg tid ac meals. 11/17/14 GI [facility] f/u as neeed Stable. Continue Omeprazole, Nifedipine.    

## 2015-04-12 NOTE — Assessment & Plan Note (Signed)
Long standing spondylosis, prn inj and Norco 10/325 qid for pain control, w/c for mobility except a few steps with walker sometimes 

## 2015-04-12 NOTE — Assessment & Plan Note (Signed)
Stable. Continue Omeprazole 20mg  daily. Complicated with achalasia, hx of dilatations.

## 2015-04-12 NOTE — Assessment & Plan Note (Signed)
Her mood is stable, continue Sertraline 25mg  since 10/12/14(GDR), off Mirtazapine. Continue prn Lorazepam

## 2015-04-12 NOTE — Assessment & Plan Note (Signed)
Switched to [place] since 09/12/12. Left   

## 2015-04-20 DIAGNOSIS — D473 Essential (hemorrhagic) thrombocythemia: Secondary | ICD-10-CM | POA: Diagnosis not present

## 2015-04-20 LAB — CBC AND DIFFERENTIAL
HEMATOCRIT: 42 % (ref 36–46)
HEMOGLOBIN: 14 g/dL (ref 12.0–16.0)
PLATELETS: 325 10*3/uL (ref 150–399)
WBC: 8.3 10*3/mL

## 2015-04-21 ENCOUNTER — Telehealth: Payer: Self-pay | Admitting: *Deleted

## 2015-04-21 NOTE — Telephone Encounter (Signed)
-----   Message from Annia Belt, MD sent at 04/20/2015  3:49 PM EST ----- Call patient - lab good - stay on current dose of Hydrea. I believe she is at [place] [phone]

## 2015-04-21 NOTE — Telephone Encounter (Signed)
Sherri Crawford - talked to [employer] informed her, calling from Dr Azucena Freed office, pt's labs are good and to stay on current dose of Hydrea. Voiced understanding and will inform pt also.

## 2015-05-03 ENCOUNTER — Encounter: Payer: Self-pay | Admitting: Nurse Practitioner

## 2015-05-03 ENCOUNTER — Non-Acute Institutional Stay (SKILLED_NURSING_FACILITY): Payer: Medicare Other | Admitting: Nurse Practitioner

## 2015-05-03 DIAGNOSIS — G8929 Other chronic pain: Secondary | ICD-10-CM

## 2015-05-03 DIAGNOSIS — F418 Other specified anxiety disorders: Secondary | ICD-10-CM | POA: Diagnosis not present

## 2015-05-03 DIAGNOSIS — F0391 Unspecified dementia with behavioral disturbance: Secondary | ICD-10-CM | POA: Diagnosis not present

## 2015-05-03 DIAGNOSIS — F419 Anxiety disorder, unspecified: Secondary | ICD-10-CM

## 2015-05-03 DIAGNOSIS — K220 Achalasia of cardia: Secondary | ICD-10-CM | POA: Diagnosis not present

## 2015-05-03 DIAGNOSIS — M5440 Lumbago with sciatica, unspecified side: Secondary | ICD-10-CM | POA: Diagnosis not present

## 2015-05-03 DIAGNOSIS — D473 Essential (hemorrhagic) thrombocythemia: Secondary | ICD-10-CM

## 2015-05-03 DIAGNOSIS — J449 Chronic obstructive pulmonary disease, unspecified: Secondary | ICD-10-CM | POA: Diagnosis not present

## 2015-05-03 DIAGNOSIS — J45909 Unspecified asthma, uncomplicated: Secondary | ICD-10-CM

## 2015-05-03 DIAGNOSIS — F32A Depression, unspecified: Secondary | ICD-10-CM

## 2015-05-03 DIAGNOSIS — F329 Major depressive disorder, single episode, unspecified: Secondary | ICD-10-CM

## 2015-05-03 DIAGNOSIS — I82402 Acute embolism and thrombosis of unspecified deep veins of left lower extremity: Secondary | ICD-10-CM | POA: Diagnosis not present

## 2015-05-03 DIAGNOSIS — I10 Essential (primary) hypertension: Secondary | ICD-10-CM | POA: Diagnosis not present

## 2015-05-03 DIAGNOSIS — K219 Gastro-esophageal reflux disease without esophagitis: Secondary | ICD-10-CM | POA: Diagnosis not present

## 2015-05-03 NOTE — Assessment & Plan Note (Signed)
dc'd Epipen prn and Immunotherapy per [facility] 01/17/13 --maintenance, takes Montelukast 10mg daily, Breo Ellipta I daily inhaled, Ventolin HFA 2 puffs q6h prn.  -last flare up 07/2014 

## 2015-05-03 NOTE — Assessment & Plan Note (Signed)
09/18/13 Nifedipine 10mg tid ac meals. 11/17/14 GI [facility] f/u as neeed Stable. Continue Omeprazole, Nifedipine.    

## 2015-05-03 NOTE — Assessment & Plan Note (Addendum)
Her mood is stable, continue Sertraline 25mg  since 10/12/14(GDR), off Mirtazapine. Continue prn Lorazepam. Obtain TSH

## 2015-05-03 NOTE — Assessment & Plan Note (Signed)
Long standing spondylosis, prn inj and Norco 10/325 qid for pain control, w/c for mobility except a few steps with walker sometimes 

## 2015-05-03 NOTE — Assessment & Plan Note (Signed)
11/25/14 MMSE 28/30, continue Namenda 28mg  daily  Update TSH

## 2015-05-03 NOTE — Progress Notes (Signed)
Patient ID: Sherri Crawford, female   DOB: [birth year], 82 y.o.   MRN: [id number]  Location:  [facility] Room Number: 35 Place of Service:  SNF (31) Provider: Marlana Latus NP  No primary care provider on file.  Patient Care Team: Estill Dooms, MD as PCP - Internal Medicine (Internal Medicine) Annia Belt, MD as Consulting Physician (Hematology and Oncology) Suella Broad, MD as Consulting Physician (Physical Medicine and Rehabilitation) Noralee Space, MD (Pulmonary Disease) [facility] Pharrah Rottman Otho Darner, NP as Nurse Practitioner (Nurse Practitioner) Inda Castle, MD as Consulting Physician (Gastroenterology)  Extended Emergency Contact Information Primary Emergency Contact: Mateja,Gene Address: [address]          [place], [place] [address] [place] Phone: [phone] Work Phone: [phone] Mobile Phone: [phone] Relation: Son Secondary Emergency Contact: [name], [place] Phone: [phone] Relation: Son  Code Status:  DNR Goals of care: Advanced Directive information Advanced Directives 05/03/2015  Does patient have an advance directive? Yes  Type of Paramedic of [place];Living will;Out of facility DNR (pink MOST or yellow form)  Does patient want to make changes to advanced directive? No - Patient declined  Copy of advanced directive(s) in chart? Yes     Chief Complaint  Patient presents with  . Medical Management of Chronic Issues    Routine visit    HPI:  Pt is a 82 y.o. female seen today for medical management of chronic diseases.  hx of dementia, has been stable on Namenda in SNF. GERD has been stable, hx of dilatations for achalasia, treated for candidiasis, f/u GI prn. Blood pressure has been controlled on Metoprolol. Hx of LLE DVT related to elevated Plt , on Eliquis chronically. Last exacerbation of COPD was  07/2014, otherwise she is stable with occasional chronic hacking cough.    Past Medical History  Diagnosis Date  . Macular degeneration   . COPD (chronic obstructive pulmonary disease) ([place])   . Hypertension   . Coronary artery disease   . Hypercholesterolemia   . GERD (gastroesophageal reflux disease)   . Asthmatic bronchitis   . History of colonic polyps   . DJD (degenerative joint disease)   . Lumbar back pain   . Osteoporosis   . History of transient ischemic attack (TIA)   . Anxiety   . Essential thrombocythemia ([place])   . Diverticulosis 05/10/2012  . Osteoarthrosis, unspecified whether generalized or localized, unspecified site 05/09/2012  . Memory loss 05/09/2012  . Abnormal weight gain 05/09/2012  . Phlebitis and thrombophlebitis of other deep vessels of lower extremities 07/10/2010  . Essential thrombocythemia ([place]) 06/09/2009  . Depression   . Esophageal stricture   . Achalasia 10/15/2014  . Essential thrombocythemia ([place]) 12/28/2014    1,000 mg on Mondays, 500 mg on Sundays, wednesdays, Fridays  Updated 12/28/14   Past Surgical History  Procedure Laterality Date  . Appendectomy    . Cholecystectomy    . [place] and bso  08/21/1995  . Cataract extraction w/ intraocular lens  implant, bilateral    . Abdominal hysterectomy    . Hip arthroplasty Right 09/09/2012    Procedure: ARTHROPLASTY BIPOLAR HIP;  Surgeon: Mauri Pole, MD;  Location: [facility];  Service: Orthopedics;  Laterality: Right;  . Esophagogastroduodenoscopy (egd) with esophageal dilation N/A 11/25/2012    Procedure: ESOPHAGOGASTRODUODENOSCOPY (EGD) WITH ESOPHAGEAL DILATION;  Surgeon: Jerene Bears, MD;  Location: [facility];  Service: Gastroenterology;  Laterality: N/A;  . Esophagogastroduodenoscopy N/A 07/26/2013    Procedure: ESOPHAGOGASTRODUODENOSCOPY (EGD);  Surgeon: Irene Shipper, MD;  Location: [facility] END[facility]COPY;  Service: Endoscopy;  Laterality: N/A;  . Esophageal manometry N/A 08/25/2013    Procedure: ESOPHAGEAL  MANOMETRY (EM);  Surgeon: Jerene Bears, MD;  Location: [facility] ENDOSCOPY;  Service: Gastroenterology;  Laterality: N/A;  . Esophagogastroduodenoscopy (egd) with propofol N/A 09/18/2013    Procedure: ESOPHAGOGASTRODUODENOSCOPY (EGD) WITH PROPOFOL;  Surgeon: Lafayette Dragon, MD;  Location: [facility] ENDOSCOPY;  Service: Endoscopy;  Laterality: N/A;  . Botox injection N/A 09/18/2013    Procedure: BOTOX INJECTION;  Surgeon: Lafayette Dragon, MD;  Location: [facility] ENDOSCOPY;  Service: Endoscopy;  Laterality: N/A;  . Esophagogastroduodenoscopy N/A 10/16/2014    Procedure: ESOPHAGOGASTRODUODENOSCOPY (EGD);  Surgeon: Inda Castle, MD;  Location: [facility];  Service: Endoscopy;  Laterality: N/A;  with botox  . Botox injection N/A 10/16/2014    Procedure: BOTOX INJECTION;  Surgeon: Inda Castle, MD;  Location: [facility] ENDOS[facility];  Service: Endoscopy;  Laterality: N/A;    Allergies  Allergen Reactions  . Sulfonamide Derivatives Other (See Comments)    Pt is unsure of reaction      Medication List       This list is accurate as of: 05/03/15 11:59 PM.  Always use your most recent med list.               acetaminophen 325 MG tablet  Commonly known as:  TYLENOL  Take 650 mg by mouth every 4 (four) hours as needed.     albuterol 108 (90 Base) MCG/ACT inhaler  Commonly known as:  PROVENTIL HFA;VENTOLIN HFA  Inhale 2 puffs into the lungs every 6 (six) hours as needed for wheezing or shortness of breath.     atorvastatin 20 MG tablet  Commonly known as:  LIPITOR  Take 20 mg by mouth every evening.     BREO ELLIPTA 100-25 MCG/INH Aepb  Generic drug:  fluticasone furoate-vilanterol  Inhale 1 Dose into the lungs daily.     chlorhexidine 0.12 % solution  Commonly known as:  PERIDEX  Use as directed 15 mLs in the mouth or throat daily.     DENTA 5000 PLUS 1.1 % Crea dental cream  Generic drug:  sodium fluoride  Place 1 application onto teeth at bedtime.     feeding supplement Liqd  Take 90 mLs by mouth 2 (two)  times daily between meals.     HYDROcodone-acetaminophen 10-325 MG tablet  Commonly known as:  NORCO  Take 1 tablet by mouth 4 (four) times daily.     hydroxyurea 500 MG capsule  Commonly known as:  HYDREA  Take 500-1,000 mg by mouth See admin instructions. Take 1000 mg on Monday and 500 mg on Sunday, Wednesday and Friday     LORazepam 0.5 MG tablet  Commonly known as:  ATIVAN  Take 0.5 mg by mouth every 8 (eight) hours as needed for anxiety (and nausea).     metoprolol tartrate 25 MG tablet  Commonly known as:  LOPRESSOR  Take 25 mg by mouth 2 (two) times daily.     montelukast 10 MG tablet  Commonly known as:  SINGULAIR  Take 10 mg by mouth at bedtime.     NAMENDA XR 28 MG Cp24 24 hr capsule  Generic drug:  memantine  Take 28 mg by mouth daily.     NIFEdipine 10 MG  capsule  Commonly known as:  PROCARDIA  Take 1 capsule (10 mg total) by mouth 3 (three) times daily before meals.     nitroGLYCERIN 0.4 MG SL tablet  Commonly known as:  NITROSTAT  Place 0.4 mg under the tongue every 5 (five) minutes as needed for chest pain.     omeprazole 20 MG capsule  Commonly known as:  PRILOSEC  Take 20 mg by mouth at bedtime.     PRESERVISION AREDS 2 Caps  Take 1 capsule by mouth 2 (two) times daily.     Rivaroxaban 15 MG Tabs tablet  Commonly known as:  XARELTO  Take 15 mg by mouth daily.     sertraline 25 MG tablet  Commonly known as:  ZOLOFT  Take 25 mg by mouth daily.        Review of Systems  Constitutional: Negative for fever, chills and diaphoresis.  HENT: Positive for hearing loss. Negative for congestion, ear discharge and ear pain.   Eyes: Negative for pain, discharge and redness.  Respiratory: Negative for cough, shortness of breath and wheezing.   Cardiovascular: Negative for chest pain, palpitations and leg swelling.  Gastrointestinal: Negative for nausea, vomiting, abdominal pain and diarrhea.  Genitourinary: Positive for frequency. Negative for dysuria and  urgency.  Musculoskeletal: Positive for back pain. Negative for myalgias and neck pain.       Lower extremity weakness. Rods wheelchair.  Skin: Negative for rash.  Neurological: Negative for dizziness, tremors, seizures, weakness and headaches.  Hematological: Does not bruise/bleed easily.  Psychiatric/Behavioral: Negative for suicidal ideas and hallucinations. The patient is not nervous/anxious.     Immunization History  Administered Date(s) Administered  . Influenza Split 11/16/2010, 12/13/2011  . Influenza Whole 11/27/2009  . Influenza-Unspecified 12/31/2013, 11/18/2014  . PPD Test 09/11/2012  . Pneumococcal Polysaccharide-23 02/28/1988   Pertinent  Health Maintenance Due  Topic Date Due  . PNA vac Low Risk Adult (2 of 2 - PCV13) 02/27/1989  . INFLUENZA VACCINE  09/28/2015  . DEXA SCAN  Completed   Fall Risk  12/28/2014 10/23/2014 11/24/2013  Falls in the past year? Yes Yes Yes  Number falls in past yr: 1 1 2  or more  Injury with Fall? No No -  Risk for fall due to : History of fall(s);Impaired balance/gait;Impaired mobility Impaired mobility History of fall(s);Impaired balance/gait;Impaired vision;Impaired mobility  Follow up Falls prevention discussed Falls evaluation completed -   Functional Status Survey:    Filed Vitals:   05/03/15 1004  BP: 126/70  Pulse: 70  Temp: 98 F (36.7 C)  TempSrc: Oral  Resp: 18  Height: 5\' 1"  (1.549 m)  Weight: 111 lb (50.349 kg)   Body mass index is 20.98 kg/(m^2). Physical Exam  Constitutional: She is oriented to person, place, and time. She appears well-developed and well-nourished. No distress.  HENT:  Head: Normocephalic and atraumatic.  Eyes: Conjunctivae and EOM are normal. Pupils are equal, round, and reactive to light.  Neck: Normal range of motion. Neck supple. No JVD present. No thyromegaly present.  Cardiovascular: Normal rate, regular rhythm and normal heart sounds.   Pulmonary/Chest: Effort normal. She has decreased  breath sounds. She has no wheezes. She has no rales.  Decreased breath sounds Bilat. Lungs.   Abdominal: Soft. Bowel sounds are normal. There is no tenderness.  Musculoskeletal: Normal range of motion. She exhibits no edema or tenderness.  Lower back-baseline: pain is well controlled with prn inj, narcotics, w/c   Lymphadenopathy:    She has no  cervical adenopathy.  Neurological: She is alert and oriented to person, place, and time. She has normal reflexes. No cranial nerve deficit. She exhibits normal muscle tone. Coordination normal.  Skin: Skin is warm and dry. No rash noted. She is not diaphoretic. No erythema (LLE).  The right hip surgical scar  Psychiatric: She has a normal mood and affect. Her affect is not angry, not blunt, not labile and not inappropriate. Her speech is not delayed, not tangential and not slurred. She is hyperactive and slowed. She is not agitated, not aggressive, not withdrawn and not combative. Thought content is not paranoid and not delusional. Cognition and memory are impaired. She does not express impulsivity or inappropriate judgment. She exhibits abnormal recent memory.    Labs reviewed:  Recent Labs  10/16/14 0154 10/22/14  NA 143 142  K 3.6 3.9  CL 106  --   CO2 26  --   GLUCOSE 95  --   BUN 17 14  CREATININE 0.82 0.6  CALCIUM 9.4  --     Recent Labs  10/16/14 0154 10/22/14  AST 35 16  ALT 20 12  ALKPHOS 70 52  BILITOT 1.2  --   PROT 7.4  --   ALBUMIN 4.0  --     Recent Labs  10/16/14 0154  12/28/14 12/28/14 1108 02/23/15 04/20/15  WBC 13.4*  < > 8.9 8.9 7.6 8.3  NEUTROABS 10.9*  --   --  6.8  --   --   HGB 14.0  < > 13.4  --  13.1 14.0  HCT 43.3  < > 35* 39.8 38 42  MCV 108.5*  --   --  103*  --   --   PLT 396  < > 424* 424* 334 325  < > = values in this interval not displayed. Lab Results  Component Value Date   TSH 1.674 10/16/2014   Lab Results  Component Value Date   HGBA1C 5.5 10/16/2014   Lab Results  Component Value  Date   CHOL 169 09/13/2011   HDL 62.80 09/13/2011   LDLCALC 79 09/13/2011   LDLDIRECT 152.3 06/04/2009   TRIG 137.0 09/13/2011   CHOLHDL 3 09/13/2011    Significant Diagnostic Results in last 30 days:  No results found.  Assessment/Plan  BACK PAIN, LUMBAR Long standing spondylosis, prn inj and Norco 10/325 qid for pain control, w/c for mobility except a few steps with walker sometimes.   Dementia 11/25/14 MMSE 28/30, continue Namenda 28mg  daily  Update TSH  COPD with asthma ([place]) dc'd Epipen prn and Immunotherapy per [facility] 01/17/13 --maintenance, takes Montelukast 10mg  daily, Breo Ellipta I daily inhaled, Ventolin HFA 2 puffs q6h prn.  -last flare up 07/2014  Achalasia of esophagus 09/18/13 Nifedipine 10mg  tid ac meals. 11/17/14 [facility] f/u as neeed Stable. Continue Omeprazole, Nifedipine.   Essential hypertension, benign Controlled, continue Metoprolol 25mg  bid. Update CMP  GERD Stable. Continue Omeprazole 20mg  daily. Complicated with achalasia, hx of dilatations  DVT (deep venous thrombosis) ([place]) Switched to [place] since 09/12/12. Left.   Anxiety and depression Her mood is stable, continue Sertraline 25mg  since 10/12/14([facility]), off Mirtazapine. Continue prn Lorazepam. Obtain TSH  Essential thrombocythemia ([place]) F/u oncology, continue hydroxyurea 1000mg  q Monday, 500mg  Sun, Wed, Fri. Last Plt 334 02/23/15    Family/ staff Communication: continue SNF for care needs.   Labs/tests ordered:  CMP, TSH

## 2015-05-03 NOTE — Assessment & Plan Note (Signed)
F/u oncology, continue hydroxyurea 1000mg  q Monday, 500mg  Sun, Wed, Fri. Last Plt 334 02/23/15

## 2015-05-03 NOTE — Assessment & Plan Note (Addendum)
Controlled, continue Metoprolol 25mg  bid. Update CMP

## 2015-05-03 NOTE — Assessment & Plan Note (Signed)
Stable. Continue Omeprazole 20mg  daily. Complicated with achalasia, hx of dilatations

## 2015-05-03 NOTE — Assessment & Plan Note (Signed)
Switched to [place] since 09/12/12. Left   

## 2015-05-04 DIAGNOSIS — R946 Abnormal results of thyroid function studies: Secondary | ICD-10-CM | POA: Diagnosis not present

## 2015-05-04 DIAGNOSIS — I10 Essential (primary) hypertension: Secondary | ICD-10-CM | POA: Diagnosis not present

## 2015-05-04 LAB — TSH: TSH: 2.89 u[IU]/mL (ref 0.41–5.90)

## 2015-05-04 LAB — HEPATIC FUNCTION PANEL
ALT: 10 U/L (ref 7–35)
AST: 14 U/L (ref 13–35)
Alkaline Phosphatase: 56 U/L (ref 25–125)
Bilirubin, Total: 0.4 mg/dL

## 2015-05-04 LAB — BASIC METABOLIC PANEL
BUN: 22 mg/dL — AB (ref 4–21)
CREATININE: 0.9 mg/dL (ref 0.5–1.1)
Glucose: 84 mg/dL
Potassium: 4.5 mmol/L (ref 3.4–5.3)
Sodium: 140 mmol/L (ref 137–147)

## 2015-05-18 DIAGNOSIS — H353132 Nonexudative age-related macular degeneration, bilateral, intermediate dry stage: Secondary | ICD-10-CM | POA: Diagnosis not present

## 2015-05-18 DIAGNOSIS — Z961 Presence of intraocular lens: Secondary | ICD-10-CM | POA: Diagnosis not present

## 2015-05-25 DIAGNOSIS — M5136 Other intervertebral disc degeneration, lumbar region: Secondary | ICD-10-CM | POA: Diagnosis not present

## 2015-05-31 ENCOUNTER — Encounter: Payer: Self-pay | Admitting: Nurse Practitioner

## 2015-05-31 ENCOUNTER — Non-Acute Institutional Stay (SKILLED_NURSING_FACILITY): Payer: Medicare Other | Admitting: Nurse Practitioner

## 2015-05-31 DIAGNOSIS — K219 Gastro-esophageal reflux disease without esophagitis: Secondary | ICD-10-CM

## 2015-05-31 DIAGNOSIS — F329 Major depressive disorder, single episode, unspecified: Secondary | ICD-10-CM

## 2015-05-31 DIAGNOSIS — M5440 Lumbago with sciatica, unspecified side: Secondary | ICD-10-CM

## 2015-05-31 DIAGNOSIS — I10 Essential (primary) hypertension: Secondary | ICD-10-CM | POA: Diagnosis not present

## 2015-05-31 DIAGNOSIS — I82A19 Acute embolism and thrombosis of unspecified axillary vein: Secondary | ICD-10-CM

## 2015-05-31 DIAGNOSIS — D473 Essential (hemorrhagic) thrombocythemia: Secondary | ICD-10-CM | POA: Diagnosis not present

## 2015-05-31 DIAGNOSIS — J45909 Unspecified asthma, uncomplicated: Secondary | ICD-10-CM | POA: Diagnosis not present

## 2015-05-31 DIAGNOSIS — K220 Achalasia of cardia: Secondary | ICD-10-CM

## 2015-05-31 DIAGNOSIS — F0390 Unspecified dementia without behavioral disturbance: Secondary | ICD-10-CM

## 2015-05-31 DIAGNOSIS — F418 Other specified anxiety disorders: Secondary | ICD-10-CM

## 2015-05-31 DIAGNOSIS — G8929 Other chronic pain: Secondary | ICD-10-CM

## 2015-05-31 DIAGNOSIS — F419 Anxiety disorder, unspecified: Secondary | ICD-10-CM

## 2015-05-31 DIAGNOSIS — J449 Chronic obstructive pulmonary disease, unspecified: Secondary | ICD-10-CM

## 2015-05-31 DIAGNOSIS — F32A Depression, unspecified: Secondary | ICD-10-CM

## 2015-05-31 NOTE — Assessment & Plan Note (Signed)
Switched to [place] since 09/12/12. Left   

## 2015-05-31 NOTE — Assessment & Plan Note (Signed)
dc'd Epipen prn and Immunotherapy per [facility] 01/17/13 --maintenance, takes Montelukast 10mg daily, Breo Ellipta I daily inhaled, Ventolin HFA 2 puffs q6h prn.  -last flare up 07/2014 

## 2015-05-31 NOTE — Assessment & Plan Note (Signed)
Long standing spondylosis, prn inj and Norco 10/325 qid for pain control, w/c for mobility except a few steps with walker sometimes 

## 2015-05-31 NOTE — Assessment & Plan Note (Signed)
Her mood is stable, continue Sertraline 25mg since 10/12/14([facility]), off Mirtazapine. Continue prn Lorazepam. 05/04/15 TSH 2.89  

## 2015-05-31 NOTE — Assessment & Plan Note (Signed)
09/18/13 Nifedipine 10mg tid ac meals. 11/17/14 GI [facility] f/u as neeed Stable. Continue Omeprazole, Nifedipine.    

## 2015-05-31 NOTE — Assessment & Plan Note (Signed)
F/u oncology, continue hydroxyurea 1000mg  q Monday, 500mg  Sun, Wed, Fri. Last Plt 334 02/23/15, 04/20/15 plt 325

## 2015-05-31 NOTE — Assessment & Plan Note (Signed)
Stable. Continue Omeprazole 20mg  daily. Complicated with achalasia, hx of dilatations

## 2015-05-31 NOTE — Progress Notes (Signed)
Patient ID: Sherri Crawford, female   DOB: [birth year], 82 y.o.   MRN: [id number]  Location:  [facility] Room Number: 35 Place of Service:  SNF (31) Provider: Marlana Latus NP  No primary care provider on file.  Patient Care Team: Estill Dooms, MD as PCP - Internal Medicine (Internal Medicine) Annia Belt, MD as Consulting Physician (Hematology and Oncology) Suella Broad, MD as Consulting Physician (Physical Medicine and Rehabilitation) Noralee Space, MD (Pulmonary Disease) [facility] Otho Darner, NP as Nurse Practitioner (Nurse Practitioner) Inda Castle, MD as Consulting Physician (Gastroenterology)  Extended Emergency Contact Information Primary Emergency Contact: Eisenhart,Gene Address: [address]          [place], [place] [address] [place] Phone: [phone] Work Phone: [phone] Mobile Phone: [phone] Relation: Son Secondary Emergency Contact: [name], [place] Phone: [phone] Relation: Son  Code Status:  DNR Goals of care: Advanced Directive information Advanced Directives 05/31/2015  Does patient have an advance directive? Yes  Type of Paramedic of [place];Living will;Out of facility DNR (pink MOST or yellow form)  Does patient want to make changes to advanced directive? No - Patient declined  Copy of advanced directive(s) in chart? Yes     Chief Complaint  Patient presents with  . Medical Management of Chronic Issues    Routine Visit    HPI:  Pt is a 81 y.o. female seen today for medical management of chronic diseases.  Hx of dementia, has been stable on Namenda in SNF. GERD has been stable, hx of dilatations for achalasia, treated for candidiasis, f/u GI prn. Blood pressure has been controlled on Metoprolol. Hx of LLE DVT related to elevated Plt , on Eliquis chronically. Last exacerbation of COPD was  07/2014, otherwise she is stable with occasional chronic hacking cough.    Past Medical History  Diagnosis Date  . Macular degeneration   . COPD (chronic obstructive pulmonary disease) ([facility])   . Hypertension   . Coronary artery disease   . Hypercholesterolemia   . GERD (gastroesophageal reflux disease)   . Asthmatic bronchitis   . History of colonic polyps   . DJD (degenerative joint disease)   . Lumbar back pain   . Osteoporosis   . History of transient ischemic attack (TIA)   . Anxiety   . Essential thrombocythemia ([place])   . Diverticulosis 05/10/2012  . Osteoarthrosis, unspecified whether generalized or localized, unspecified site 05/09/2012  . Memory loss 05/09/2012  . Abnormal weight gain 05/09/2012  . Phlebitis and thrombophlebitis of other deep vessels of lower extremities 07/10/2010  . Essential thrombocythemia ([place]) 06/09/2009  . Depression   . Esophageal stricture   . Achalasia 10/15/2014  . Essential thrombocythemia ([place]) 12/28/2014    1,000 mg on Mondays, 500 mg on Sundays, wednesdays, Fridays  Updated 12/28/14   Past Surgical History  Procedure Laterality Date  . Appendectomy    . Cholecystectomy    . [place] and bso  08/21/1995  . Cataract extraction w/ intraocular lens  implant, bilateral    . Abdominal hysterectomy    . Hip arthroplasty Right 09/09/2012    Procedure: ARTHROPLASTY BIPOLAR HIP;  Surgeon: Mauri Pole, MD;  Location: [facility] ORS;  Service: Orthopedics;  Laterality: Right;  . Esophagogastroduodenoscopy (egd) with esophageal dilation N/A 11/25/2012    Procedure: ESOPHAGOGASTRODUODENOSCOPY (EGD) WITH ESOPHAGEAL DILATION;  Surgeon: Jerene Bears, MD;  Location: [facility];  Service: Gastroenterology;  Laterality: N/A;  . Esophagogastroduodenoscopy N/A 07/26/2013    Procedure: ESOPHAGOGASTRODUODENOSCOPY (EGD);  Surgeon: Irene Shipper, MD;  Location: [facility] ENDOSCOPY;  Service: Endoscopy;  Laterality: N/A;  . Esophageal manometry N/A 08/25/2013    Procedure: ESOPHAGEAL  MANOMETRY (EM);  Surgeon: Jerene Bears, MD;  Location: [facility] ENDOSCOPY;  Service: Gastroenterology;  Laterality: N/A;  . Esophagogastroduodenoscopy (egd) with propofol N/A 09/18/2013    Procedure: ESOPHAGOGASTRODUODENOSCOPY (EGD) WITH PROPOFOL;  Surgeon: Lafayette Dragon, MD;  Location: [facility] ENDOSCOPY;  Service: Endoscopy;  Laterality: N/A;  . Botox injection N/A 09/18/2013    Procedure: BOTOX INJECTION;  Surgeon: Lafayette Dragon, MD;  Location: [facility] ENDOSCOPY;  Service: Endoscopy;  Laterality: N/A;  . Esophagogastroduodenoscopy N/A 10/16/2014    Procedure: ESOPHAGOGASTRODUODENOSCOPY (EGD);  Surgeon: Inda Castle, MD;  Location: [facility];  Service: Endoscopy;  Laterality: N/A;  with botox  . Botox injection N/A 10/16/2014    Procedure: BOTOX INJECTION;  Surgeon: Inda Castle, MD;  Location: [facility] ENDOSCOPY;  Service: Endoscopy;  Laterality: N/A;    Allergies  Allergen Reactions  . Sulfonamide Derivatives Other (See Comments)    Pt is unsure of reaction      Medication List       This list is accurate as of: 05/31/15  2:22 PM.  Always use your most recent med list.               acetaminophen 325 MG tablet  Commonly known as:  TYLENOL  Take 650 mg by mouth every 4 (four) hours as needed.     albuterol 108 (90 Base) MCG/ACT inhaler  Commonly known as:  PROVENTIL HFA;VENTOLIN HFA  Inhale 2 puffs into the lungs every 6 (six) hours as needed for wheezing or shortness of breath.     atorvastatin 20 MG tablet  Commonly known as:  LIPITOR  Take 20 mg by mouth every evening.     BREO ELLIPTA 100-25 MCG/INH Aepb  Generic drug:  fluticasone furoate-vilanterol  Inhale 1 Dose into the lungs daily.     chlorhexidine 0.12 % solution  Commonly known as:  PERIDEX  Use as directed 15 mLs in the mouth or throat daily.     DENTA 5000 PLUS 1.1 % Crea dental cream  Generic drug:  sodium fluoride  Place 1 application onto teeth at bedtime.     feeding supplement Liqd  Take 90 mLs by mouth 2 (two)  times daily between meals.     HYDROcodone-acetaminophen 10-325 MG tablet  Commonly known as:  NORCO  Take 1 tablet by mouth 4 (four) times daily.     hydroxyurea 500 MG capsule  Commonly known as:  HYDREA  Take 500-1,000 mg by mouth See admin instructions. Take 1000 mg on Monday and 500 mg on Sunday, Wednesday and Friday     LORazepam 0.5 MG tablet  Commonly known as:  ATIVAN  Take 0.5 mg by mouth every 8 (eight) hours as needed for anxiety (and nausea).     metoprolol tartrate 25 MG tablet  Commonly known as:  LOPRESSOR  Take 25 mg by mouth 2 (two) times daily.     montelukast 10 MG tablet  Commonly known as:  SINGULAIR  Take 10 mg by mouth at bedtime.     NAMENDA XR 28 MG Cp24 24 hr capsule  Generic drug:  memantine  Take 28 mg by mouth daily.     NIFEdipine 10  MG capsule  Commonly known as:  PROCARDIA  Take 1 capsule (10 mg total) by mouth 3 (three) times daily before meals.     nitroGLYCERIN 0.4 MG SL tablet  Commonly known as:  NITROSTAT  Place 0.4 mg under the tongue every 5 (five) minutes as needed for chest pain.     omeprazole 20 MG capsule  Commonly known as:  PRILOSEC  Take 20 mg by mouth at bedtime.     PRESERVISION AREDS 2 Caps  Take 1 capsule by mouth 2 (two) times daily.     Rivaroxaban 15 MG Tabs tablet  Commonly known as:  XARELTO  Take 15 mg by mouth daily.     sertraline 25 MG tablet  Commonly known as:  ZOLOFT  Take 25 mg by mouth daily.        Review of Systems  Constitutional: Negative for fever, chills and diaphoresis.  HENT: Positive for hearing loss. Negative for congestion, ear discharge and ear pain.   Eyes: Negative for pain, discharge and redness.  Respiratory: Negative for cough, shortness of breath and wheezing.   Cardiovascular: Negative for chest pain, palpitations and leg swelling.  Gastrointestinal: Negative for nausea, vomiting, abdominal pain and diarrhea.  Genitourinary: Positive for frequency. Negative for dysuria and  urgency.  Musculoskeletal: Positive for back pain. Negative for myalgias and neck pain.       Lower extremity weakness. Rods wheelchair.  Skin: Negative for rash.  Neurological: Negative for dizziness, tremors, seizures, weakness and headaches.  Hematological: Does not bruise/bleed easily.  Psychiatric/Behavioral: Negative for suicidal ideas and hallucinations. The patient is not nervous/anxious.     Immunization History  Administered Date(s) Administered  . Influenza Split 11/16/2010, 12/13/2011  . Influenza Whole 11/27/2009  . Influenza-Unspecified 12/31/2013, 11/18/2014  . PPD Test 09/11/2012  . Pneumococcal Polysaccharide-23 02/28/1988   Pertinent  Health Maintenance Due  Topic Date Due  . PNA vac Low Risk Adult (2 of 2 - PCV13) 02/27/1989  . INFLUENZA VACCINE  09/28/2015  . DEXA SCAN  Completed   Fall Risk  12/28/2014 10/23/2014 11/24/2013  Falls in the past year? Yes Yes Yes  Number falls in past yr: 1 1 2  or more  Injury with Fall? No No -  Risk for fall due to : History of fall(s);Impaired balance/gait;Impaired mobility Impaired mobility History of fall(s);Impaired balance/gait;Impaired vision;Impaired mobility  Follow up Falls prevention discussed Falls evaluation completed -   Functional Status Survey:    Filed Vitals:   05/31/15 1248  BP: 126/70  Pulse: 70  Temp: 98 F (36.7 C)  TempSrc: Oral  Resp: 18  Height: 5\' 1"  (1.549 m)  Weight: 112 lb (50.803 kg)   Body mass index is 21.17 kg/(m^2). Physical Exam  Constitutional: She is oriented to person, place, and time. She appears well-developed and well-nourished. No distress.  HENT:  Head: Normocephalic and atraumatic.  Eyes: Conjunctivae and EOM are normal. Pupils are equal, round, and reactive to light.  Neck: Normal range of motion. Neck supple. No JVD present. No thyromegaly present.  Cardiovascular: Normal rate, regular rhythm and normal heart sounds.   Pulmonary/Chest: Effort normal. She has decreased  breath sounds. She has no wheezes. She has no rales.  Decreased breath sounds Bilat. Lungs.   Abdominal: Soft. Bowel sounds are normal. There is no tenderness.  Musculoskeletal: Normal range of motion. She exhibits no edema or tenderness.  Lower back-baseline: pain is well controlled with prn inj, narcotics, w/c   Lymphadenopathy:    She has  no cervical adenopathy.  Neurological: She is alert and oriented to person, place, and time. She has normal reflexes. No cranial nerve deficit. She exhibits normal muscle tone. Coordination normal.  Skin: Skin is warm and dry. No rash noted. She is not diaphoretic. No erythema (LLE).  The right hip surgical scar  Psychiatric: She has a normal mood and affect. Her affect is not angry, not blunt, not labile and not inappropriate. Her speech is not delayed, not tangential and not slurred. She is hyperactive and slowed. She is not agitated, not aggressive, not withdrawn and not combative. Thought content is not paranoid and not delusional. Cognition and memory are impaired. She does not express impulsivity or inappropriate judgment. She exhibits abnormal recent memory.    Labs reviewed:  Recent Labs  10/16/14 0154 10/22/14 05/04/15  NA 143 142 140  K 3.6 3.9 4.5  CL 106  --   --   CO2 26  --   --   GLUCOSE 95  --   --   BUN 17 14 22*  CREATININE 0.82 0.6 0.9  CALCIUM 9.4  --   --     Recent Labs  10/16/14 0154 10/22/14 05/04/15  AST 35 16 14  ALT 20 12 10   ALKPHOS 70 52 56  BILITOT 1.2  --   --   PROT 7.4  --   --   ALBUMIN 4.0  --   --     Recent Labs  10/16/14 0154  12/28/14 12/28/14 1108 02/23/15 04/20/15  WBC 13.4*  < > 8.9 8.9 7.6 8.3  NEUTROABS 10.9*  --   --  6.8  --   --   HGB 14.0  < > 13.4  --  13.1 14.0  HCT 43.3  < > 35* 39.8 38 42  MCV 108.5*  --   --  103*  --   --   PLT 396  < > 424* 424* 334 325  < > = values in this interval not displayed. Lab Results  Component Value Date   TSH 2.89 05/04/2015   Lab Results    Component Value Date   HGBA1C 5.5 10/16/2014   Lab Results  Component Value Date   CHOL 169 09/13/2011   HDL 62.80 09/13/2011   LDLCALC 79 09/13/2011   LDLDIRECT 152.3 06/04/2009   TRIG 137.0 09/13/2011   CHOLHDL 3 09/13/2011    Significant Diagnostic Results in last 30 days:  No results found.  Assessment/Plan  Achalasia of esophagus 09/18/13 Nifedipine 10mg  tid ac meals. 11/17/14 [facility] f/u as neeed Stable. Continue Omeprazole, Nifedipine.    Anxiety and depression Her mood is stable, continue Sertraline 25mg  since 10/12/14([facility]), off Mirtazapine. Continue prn Lorazepam. 05/04/15 TSH 2.89  BACK PAIN, LUMBAR Long standing spondylosis, prn inj and Norco 10/325 qid for pain control, w/c for mobility except a few steps with walker sometimes.  COPD with asthma ([place]) dc'd Epipen prn and Immunotherapy per [facility] 01/17/13 --maintenance, takes Montelukast 10mg  daily, Breo Ellipta I daily inhaled, Ventolin HFA 2 puffs q6h prn.  -last flare up 07/2014  Dementia 11/25/14 MMSE 28/30, continue Namenda 28mg  daily. 05/04/15 TSH 2.89  DVT (deep venous thrombosis) ([place]) Switched to [facility] since 09/12/12. Left.   Essential hypertension, benign Controlled, continue Metoprolol 25mg  bid.   Essential thrombocythemia [facility]) F/u oncology, continue hydroxyurea 1000mg  q Monday, 500mg  Sun, Wed, Fri. Last Plt 334 02/23/15, 04/20/15 plt 325  GERD Stable. Continue Omeprazole 20mg  daily. Complicated with achalasia, hx of dilatations  Family/ staff Communication: continue SNF for care needs.   Labs/tests ordered:  none

## 2015-05-31 NOTE — Assessment & Plan Note (Signed)
Controlled, continue Metoprolol 25mg bid 

## 2015-05-31 NOTE — Assessment & Plan Note (Addendum)
11/25/14 MMSE 28/30, continue Namenda 28mg  daily. 05/04/15 TSH 2.89

## 2015-06-15 DIAGNOSIS — D473 Essential (hemorrhagic) thrombocythemia: Secondary | ICD-10-CM | POA: Diagnosis not present

## 2015-06-16 ENCOUNTER — Telehealth: Payer: Self-pay | Admitting: *Deleted

## 2015-06-16 DIAGNOSIS — H903 Sensorineural hearing loss, bilateral: Secondary | ICD-10-CM | POA: Diagnosis not present

## 2015-06-16 NOTE — Telephone Encounter (Signed)
Called [facility] - Dr Beryle Beams received pt's lab results yesterday. Talked to the nurse, Rhona Leavens - informed her , pt to "stay on same dose of Hydrea". Nurse repeated instructions.

## 2015-07-05 ENCOUNTER — Encounter: Payer: Self-pay | Admitting: Nurse Practitioner

## 2015-07-05 ENCOUNTER — Non-Acute Institutional Stay: Payer: Medicare Other | Admitting: Nurse Practitioner

## 2015-07-05 DIAGNOSIS — I82402 Acute embolism and thrombosis of unspecified deep veins of left lower extremity: Secondary | ICD-10-CM | POA: Diagnosis not present

## 2015-07-05 DIAGNOSIS — I10 Essential (primary) hypertension: Secondary | ICD-10-CM

## 2015-07-05 DIAGNOSIS — F329 Major depressive disorder, single episode, unspecified: Secondary | ICD-10-CM

## 2015-07-05 DIAGNOSIS — F418 Other specified anxiety disorders: Secondary | ICD-10-CM | POA: Diagnosis not present

## 2015-07-05 DIAGNOSIS — J449 Chronic obstructive pulmonary disease, unspecified: Secondary | ICD-10-CM

## 2015-07-05 DIAGNOSIS — K220 Achalasia of cardia: Secondary | ICD-10-CM | POA: Diagnosis not present

## 2015-07-05 DIAGNOSIS — K219 Gastro-esophageal reflux disease without esophagitis: Secondary | ICD-10-CM

## 2015-07-05 DIAGNOSIS — J45909 Unspecified asthma, uncomplicated: Secondary | ICD-10-CM

## 2015-07-05 DIAGNOSIS — F0390 Unspecified dementia without behavioral disturbance: Secondary | ICD-10-CM

## 2015-07-05 DIAGNOSIS — D473 Essential (hemorrhagic) thrombocythemia: Secondary | ICD-10-CM

## 2015-07-05 DIAGNOSIS — F32A Depression, unspecified: Secondary | ICD-10-CM

## 2015-07-05 DIAGNOSIS — F419 Anxiety disorder, unspecified: Secondary | ICD-10-CM

## 2015-07-05 DIAGNOSIS — M5440 Lumbago with sciatica, unspecified side: Secondary | ICD-10-CM

## 2015-07-05 NOTE — Assessment & Plan Note (Signed)
Switched to [place] since 09/12/12. Left   

## 2015-07-05 NOTE — Assessment & Plan Note (Signed)
dc'd Epipen prn and Immunotherapy per [facility] 01/17/13 --maintenance, takes Montelukast 10mg  daily, Breo Ellipta I daily inhaled, Ventolin HFA 2 puffs q6h prn.  -last flare up 07/2014

## 2015-07-05 NOTE — Assessment & Plan Note (Signed)
Long standing spondylosis, prn inj and Norco 10/325 qid for pain control, w/c for mobility except a few steps with walker sometimes 

## 2015-07-05 NOTE — Assessment & Plan Note (Signed)
Stable. Continue Omeprazole 20mg  daily. Complicated with achalasia, hx of dilatations

## 2015-07-05 NOTE — Assessment & Plan Note (Signed)
11/25/14 MMSE 28/30, continue Namenda 28mg  daily. 05/04/15 TSH 2.89

## 2015-07-05 NOTE — Assessment & Plan Note (Signed)
Controlled, continue Metoprolol 25mg bid 

## 2015-07-05 NOTE — Progress Notes (Signed)
Patient ID: Sherri Crawford, female   DOB: 1920/08/05, 82 y.o.   MRN: [id number]  Location:  [facility] Room Number: 35 Place of Service:  SNF (31) Provider: Marlana Latus NP  No primary care provider on file.  Patient Care Team: Estill Dooms, MD as PCP - Internal Medicine (Internal Medicine) Annia Belt, MD as Consulting Physician (Hematology and Oncology) Suella Broad, MD as Consulting Physician (Physical Medicine and Rehabilitation) Noralee Space, MD (Pulmonary Disease) [facility] Ichael Pullara Otho Darner, NP as Nurse Practitioner (Nurse Practitioner) Inda Castle, MD as Consulting Physician (Gastroenterology)  Extended Emergency Contact Information Primary Emergency Contact: Tokarz,Gene Address: [address]          [place], [place] [address] [place] Phone: [phone] Work Phone: [phone] Mobile Phone: [phone] Relation: Son Secondary Emergency Contact: [name], [place] Phone: [phone] Relation: Son  Code Status:  DNR Goals of care: Advanced Directive information Advanced Directives 07/05/2015  Does patient have an advance directive? Yes  Type of Paramedic of [place];Living will;Out of facility DNR (pink MOST or yellow form)  Does patient want to make changes to advanced directive? No - Patient declined  Copy of advanced directive(s) in chart? Yes     Chief Complaint  Patient presents with  . Medical Management of Chronic Issues    Routine Visit    HPI:  Pt is a 82 y.o. female seen today for medical management of chronic diseases.  Hx of dementia, has been stable on Namenda in SNF. GERD has been stable, hx of dilatations for achalasia, treated for candidiasis, f/u GI prn. Blood pressure has been controlled on Metoprolol. Hx of LLE DVT related to elevated Plt , on Eliquis chronically. Last exacerbation of COPD was  07/2014, otherwise she is stable with occasional chronic hacking cough.   Past Medical History  Diagnosis Date  . Macular degeneration   . COPD (chronic obstructive pulmonary disease) ([place])   . Hypertension   . Coronary artery disease   . Hypercholesterolemia   . GERD (gastroesophageal reflux disease)   . Asthmatic bronchitis   . History of colonic polyps   . DJD (degenerative joint disease)   . Lumbar back pain   . Osteoporosis   . History of transient ischemic attack (TIA)   . Anxiety   . Essential thrombocythemia ([place])   . Diverticulosis 05/10/2012  . Osteoarthrosis, unspecified whether generalized or localized, unspecified site 05/09/2012  . Memory loss 05/09/2012  . Abnormal weight gain 05/09/2012  . Phlebitis and thrombophlebitis of other deep vessels of lower extremities 07/10/2010  . Essential thrombocythemia ([place]) 06/09/2009  . Depression   . Esophageal stricture   . Achalasia 10/15/2014  . Essential thrombocythemia ([place]) 12/28/2014    1,000 mg on Mondays, 500 mg on Sundays, wednesdays, Fridays  Updated 12/28/14   Past Surgical History  Procedure Laterality Date  . Appendectomy    . Cholecystectomy    . Tah and bso  08/21/1995  . Cataract extraction w/ intraocular lens  implant, bilateral    . Abdominal hysterectomy    . Hip arthroplasty Right 09/09/2012    Procedure: ARTHROPLASTY BIPOLAR HIP;  Surgeon: Mauri Pole, MD;  Location: [facility];  Service: Orthopedics;  Laterality: Right;  . Esophagogastroduodenoscopy (egd) with esophageal dilation N/A 11/25/2012    Procedure: ESOPHAGOGASTRODUODENOSCOPY (EGD) WITH ESOPHAGEAL DILATION;  Surgeon:  Jerene Bears, MD;  Location: [facility];  Service: Gastroenterology;  Laterality: N/A;  . Esophagogastroduodenoscopy N/A 07/26/2013    Procedure: ESOPHAGOGASTRODUODENOSCOPY (EGD);  Surgeon: Irene Shipper, MD;  Location[facility];  Service: Endoscopy;  Laterality: N/A;  . Esophageal manometry N/A 08/25/2013    Procedure: ESOPHAGEAL  MANOMETRY (EM);  Surgeon: Jerene Bears, MD;  Location: [facility] ENDOS[facility];  Service: Gastroenterology;  Laterality: N/A;  . Esophagogastroduodenoscopy (egd) with propofol N/A 09/18/2013    Procedure: ESOPHAGOGASTRODUODENOSCOPY (EGD) WITH PROPOFOL;  Surgeon: Lafayette Dragon, MD;  Location: [facility] END[facility];  Service: Endoscopy;  Laterality: N/A;  . Botox injection N/A 09/18/2013    Procedure: BOTOX INJECTION;  Surgeon: Lafayette Dragon, MD;  Location: [facility];  Service: Endoscopy;  Laterality: N/A;  . Esophagogastroduodenoscopy N/A 10/16/2014    Procedure: ESOPHAGOGASTRODUODENOSCOPY (EGD);  Surgeon: Inda Castle, MD;  Location: [facility];  Service: Endoscopy;  Laterality: N/A;  with botox  . Botox injection N/A 10/16/2014    Procedure: BOTOX INJECTION;  Surgeon: Inda Castle, MD;  Location: [facility];  Service: Endoscopy;  Laterality: N/A;    Allergies  Allergen Reactions  . Sulfonamide Derivatives Other (See Comments)    Pt is unsure of reaction      Medication List       This list is accurate as of: 07/05/15  1:00 PM.  Always use your most recent med list.               acetaminophen 325 MG tablet  Commonly known as:  TYLENOL  Take 650 mg by mouth every 4 (four) hours as needed.     albuterol 108 (90 Base) MCG/ACT inhaler  Commonly known as:  PROVENTIL HFA;VENTOLIN HFA  Inhale 2 puffs into the lungs every 6 (six) hours as needed for wheezing or shortness of breath.     atorvastatin 20 MG tablet  Commonly known as:  LIPITOR  Take 20 mg by mouth every evening.     BREO ELLIPTA 100-25 MCG/INH Aepb  Generic drug:  fluticasone furoate-vilanterol  Inhale 1 Dose into the lungs daily.     chlorhexidine 0.12 % solution  Commonly known as:  PERIDEX  Use as directed 15 mLs in the mouth or throat daily.     DENTA 5000 PLUS 1.1 % Crea dental cream  Generic drug:  sodium fluoride  Place 1 application onto teeth at bedtime.     feeding supplement Liqd  Take 90 mLs by mouth 2 (two)  times daily between meals.     HYDROcodone-acetaminophen 10-325 MG tablet  Commonly known as:  NORCO  Take 1 tablet by mouth 4 (four) times daily.     hydroxyurea 500 MG capsule  Commonly known as:  HYDREA  Take 500-1,000 mg by mouth See admin instructions. Take 1000 mg on Monday and 500 mg on Sunday, Wednesday and Friday     LORazepam 0.5 MG tablet  Commonly known as:  ATIVAN  Take 0.5 mg by mouth every 8 (eight) hours as needed for anxiety (and nausea).     metoprolol tartrate 25 MG tablet  Commonly known as:  LOPRESSOR  Take 25 mg by mouth 2 (two) times daily.     montelukast 10 MG tablet  Commonly known as:  SINGULAIR  Take 10 mg by mouth at bedtime.     NAMENDA XR 28 MG Cp24 24 hr capsule  Generic drug:  memantine  Take 28 mg by mouth daily.     NIFEdipine 10 MG  capsule  Commonly known as:  PROCARDIA  Take 1 capsule (10 mg total) by mouth 3 (three) times daily before meals.     nitroGLYCERIN 0.4 MG SL tablet  Commonly known as:  NITROSTAT  Place 0.4 mg under the tongue every 5 (five) minutes as needed for chest pain.     omeprazole 20 MG capsule  Commonly known as:  PRILOSEC  Take 20 mg by mouth at bedtime.     PRESERVISION AREDS 2 Caps  Take 1 capsule by mouth 2 (two) times daily.     Rivaroxaban 15 MG Tabs tablet  Commonly known as:  XARELTO  Take 15 mg by mouth daily.     sertraline 25 MG tablet  Commonly known as:  ZOLOFT  Take 25 mg by mouth daily.        Review of Systems  Constitutional: Negative for fever, chills and diaphoresis.  HENT: Positive for hearing loss. Negative for congestion, ear discharge and ear pain.   Eyes: Negative for pain, discharge and redness.  Respiratory: Negative for cough, shortness of breath and wheezing.   Cardiovascular: Negative for chest pain, palpitations and leg swelling.  Gastrointestinal: Negative for nausea, vomiting, abdominal pain and diarrhea.  Genitourinary: Positive for frequency. Negative for dysuria and  urgency.  Musculoskeletal: Positive for back pain. Negative for myalgias and neck pain.       Lower extremity weakness. Rods wheelchair.  Skin: Negative for rash.  Neurological: Negative for dizziness, tremors, seizures, weakness and headaches.  Hematological: Does not bruise/bleed easily.  Psychiatric/Behavioral: Negative for suicidal ideas and hallucinations. The patient is not nervous/anxious.     Immunization History  Administered Date(s) Administered  . Influenza Split 11/16/2010, 12/13/2011  . Influenza Whole 11/27/2009  . Influenza-Unspecified 12/31/2013, 11/18/2014  . PPD Test 09/11/2012  . Pneumococcal Polysaccharide-23 02/28/1988   Pertinent  Health Maintenance Due  Topic Date Due  . PNA vac Low Risk Adult (2 of 2 - PCV13) 02/27/1989  . INFLUENZA VACCINE  09/28/2015  . DEXA SCAN  Completed   Fall Risk  12/28/2014 10/23/2014 11/24/2013  Falls in the past year? Yes Yes Yes  Number falls in past yr: 1 1 2  or more  Injury with Fall? No No -  Risk for fall due to : History of fall(s);Impaired balance/gait;Impaired mobility Impaired mobility History of fall(s);Impaired balance/gait;Impaired vision;Impaired mobility  Follow up Falls prevention discussed Falls evaluation completed -   Functional Status Survey:    Filed Vitals:   07/05/15 0956  BP: 108/60  Pulse: 68  Temp: 98 F (36.7 C)  TempSrc: Oral  Resp: 18  Height: 5\' 1"  (1.549 m)  Weight: 111 lb 4.8 oz (50.485 kg)   Body mass index is 21.04 kg/(m^2). Physical Exam  Constitutional: She is oriented to person, place, and time. She appears well-developed and well-nourished. No distress.  HENT:  Head: Normocephalic and atraumatic.  Eyes: Conjunctivae and EOM are normal. Pupils are equal, round, and reactive to light.  Neck: Normal range of motion. Neck supple. No JVD present. No thyromegaly present.  Cardiovascular: Normal rate, regular rhythm and normal heart sounds.   Pulmonary/Chest: Effort normal. She has  decreased breath sounds. She has no wheezes. She has no rales.  Decreased breath sounds Bilat. Lungs.   Abdominal: Soft. Bowel sounds are normal. There is no tenderness.  Musculoskeletal: Normal range of motion. She exhibits no edema or tenderness.  Lower back-baseline: pain is well controlled with prn inj, narcotics, w/c   Lymphadenopathy:    She  has no cervical adenopathy.  Neurological: She is alert and oriented to person, place, and time. She has normal reflexes. No cranial nerve deficit. She exhibits normal muscle tone. Coordination normal.  Skin: Skin is warm and dry. No rash noted. She is not diaphoretic. No erythema (LLE).  The right hip surgical scar  Psychiatric: She has a normal mood and affect. Her affect is not angry, not blunt, not labile and not inappropriate. Her speech is not delayed, not tangential and not slurred. She is hyperactive and slowed. She is not agitated, not aggressive, not withdrawn and not combative. Thought content is not paranoid and not delusional. Cognition and memory are impaired. She does not express impulsivity or inappropriate judgment. She exhibits abnormal recent memory.    Labs reviewed:  Recent Labs  10/16/14 0154 10/22/14 05/04/15  NA 143 142 140  K 3.6 3.9 4.5  CL 106  --   --   CO2 26  --   --   GLUCOSE 95  --   --   BUN 17 14 22*  CREATININE 0.82 0.6 0.9  CALCIUM 9.4  --   --     Recent Labs  10/16/14 0154 10/22/14 05/04/15  AST 35 16 14  ALT 20 12 10   ALKPHOS 70 52 56  BILITOT 1.2  --   --   PROT 7.4  --   --   ALBUMIN 4.0  --   --     Recent Labs  10/16/14 0154  12/28/14 12/28/14 1108 02/23/15 04/20/15  WBC 13.4*  < > 8.9 8.9 7.6 8.3  NEUTROABS 10.9*  --   --  6.8  --   --   HGB 14.0  < > 13.4  --  13.1 14.0  HCT 43.3  < > 35* 39.8 38 42  MCV 108.5*  --   --  103*  --   --   PLT 396  < > 424* 424* 334 325  < > = values in this interval not displayed. Lab Results  Component Value Date   TSH 2.89 05/04/2015   Lab  Results  Component Value Date   HGBA1C 5.5 10/16/2014   Lab Results  Component Value Date   CHOL 169 09/13/2011   HDL 62.80 09/13/2011   LDLCALC 79 09/13/2011   LDLDIRECT 152.3 06/04/2009   TRIG 137.0 09/13/2011   CHOLHDL 3 09/13/2011    Significant Diagnostic Results in last 30 days:  No results found.  Assessment/Plan  Achalasia of esophagus 09/18/13 Nifedipine 10mg  tid ac meals. 11/17/14 [facility] f/u as neeed Stable. Continue Omeprazole, Nifedipine.     Anxiety and depression Her mood is stable, continue Sertraline 25mg  since 10/12/14([facility]DR), off Mirtazapine. Continue prn Lorazepam. 05/04/15 TSH 2.89  BACK PAIN, LUMBAR Long standing spondylosis, prn inj and Norco 10/325 qid for pain control, w/c for mobility except a few steps with walker sometimes.   COPD with asthma ([place]) dc'd Epipen prn and Immunotherapy per [facility] 01/17/13 --maintenance, takes Montelukast 10mg  daily, Breo Ellipta I daily inhaled, Ventolin HFA 2 puffs q6h prn.  -last flare up 07/2014   Dementia 11/25/14 MMSE 28/30, continue Namenda 28mg  daily. 05/04/15 TSH 2.89   DVT (deep venous thrombosis) ([place]) Switched to [place] since 09/12/12. Left.    Essential hypertension, benign Controlled, continue Metoprolol 25mg  bid.    Essential thrombocythemia [facility]) F/u oncology, continue hydroxyurea 1000mg  q Monday, 500mg  Sun, Wed, Fri. Last Plt 317 06/15/15   GERD Stable. Continue Omeprazole 20mg  daily. Complicated with achalasia,  hx of dilatations     Family/ staff Communication: continue SNF for care needs.   Labs/tests ordered:  none

## 2015-07-05 NOTE — Assessment & Plan Note (Signed)
Her mood is stable, continue Sertraline 25mg  since 10/12/14(GDR), off Mirtazapine. Continue prn Lorazepam. 05/04/15 TSH 2.89

## 2015-07-05 NOTE — Assessment & Plan Note (Signed)
09/18/13 Nifedipine 10mg  tid ac meals. 11/17/14 [facility] f/u as neeed Stable. Continue Omeprazole, Nifedipine.

## 2015-07-05 NOTE — Assessment & Plan Note (Signed)
F/u oncology, continue hydroxyurea 1000mg  q Monday, 500mg  Sun, Wed, Fri. Last Plt 317 06/15/15

## 2015-07-13 DIAGNOSIS — H6123 Impacted cerumen, bilateral: Secondary | ICD-10-CM | POA: Diagnosis not present

## 2015-07-13 DIAGNOSIS — Z974 Presence of external hearing-aid: Secondary | ICD-10-CM | POA: Diagnosis not present

## 2015-07-13 DIAGNOSIS — H903 Sensorineural hearing loss, bilateral: Secondary | ICD-10-CM | POA: Diagnosis not present

## 2015-08-02 ENCOUNTER — Encounter: Payer: Self-pay | Admitting: Nurse Practitioner

## 2015-08-02 ENCOUNTER — Non-Acute Institutional Stay (SKILLED_NURSING_FACILITY): Payer: Medicare Other | Admitting: Nurse Practitioner

## 2015-08-02 DIAGNOSIS — J449 Chronic obstructive pulmonary disease, unspecified: Secondary | ICD-10-CM | POA: Diagnosis not present

## 2015-08-02 DIAGNOSIS — I82402 Acute embolism and thrombosis of unspecified deep veins of left lower extremity: Secondary | ICD-10-CM

## 2015-08-02 DIAGNOSIS — M5440 Lumbago with sciatica, unspecified side: Secondary | ICD-10-CM

## 2015-08-02 DIAGNOSIS — F0390 Unspecified dementia without behavioral disturbance: Secondary | ICD-10-CM

## 2015-08-02 DIAGNOSIS — D473 Essential (hemorrhagic) thrombocythemia: Secondary | ICD-10-CM

## 2015-08-02 DIAGNOSIS — I10 Essential (primary) hypertension: Secondary | ICD-10-CM

## 2015-08-02 DIAGNOSIS — K219 Gastro-esophageal reflux disease without esophagitis: Secondary | ICD-10-CM | POA: Diagnosis not present

## 2015-08-02 DIAGNOSIS — I2510 Atherosclerotic heart disease of native coronary artery without angina pectoris: Secondary | ICD-10-CM | POA: Diagnosis not present

## 2015-08-02 DIAGNOSIS — J45909 Unspecified asthma, uncomplicated: Secondary | ICD-10-CM

## 2015-08-02 DIAGNOSIS — K220 Achalasia of cardia: Secondary | ICD-10-CM | POA: Diagnosis not present

## 2015-08-02 NOTE — Assessment & Plan Note (Signed)
Stable, no angina since last seen.

## 2015-08-02 NOTE — Assessment & Plan Note (Signed)
Stable. Continue Omeprazole 20mg  daily. Complicated with achalasia, hx of dilatations

## 2015-08-02 NOTE — Progress Notes (Signed)
Patient ID: Sherri Crawford, female   DOB: 05-04-1920, 82 y.o.   MRN: [id number]  Location:  [facility] Room Number: 35 Place of Service:  SNF (31) Provider: Marlana Latus NP  No primary care provider on file.  Patient Care Team: Estill Dooms, MD as PCP - Internal Medicine (Internal Medicine) Annia Belt, MD as Consulting Physician (Hematology and Oncology) Suella Broad, MD as Consulting Physician (Physical Medicine and Rehabilitation) Noralee Space, MD (Pulmonary Disease) [facility] Otho Darner, NP as Nurse Practitioner (Nurse Practitioner) Inda Castle, MD as Consulting Physician (Gastroenterology)  Extended Emergency Contact Information Primary Emergency Contact: Renderos,Gene Address: [address]          [place], [place] [address] [place] Phone: [phone] Work Phone: [phone] Mobile Phone: [phone] Relation: Son Secondary Emergency Contact: [name], Festus [place] Phone: [phone] Relation: Son  Code Status:  DNR Goals of care: Advanced Directive information Advanced Directives 07/05/2015  Does patient have an advance directive? Yes  Type of Paramedic of [place];Living will;Out of facility DNR (pink MOST or yellow form)  Does patient want to make changes to advanced directive? No - Patient declined  Copy of advanced directive(s) in chart? Yes     Chief Complaint  Patient presents with  . Medical Management of Chronic Issues    HPI:  Pt is a 82 y.o. female seen today for medical management of chronic diseases.  Hx of dementia, has been stable on Namenda in SNF. GERD has been stable, hx of dilatations for achalasia, treated for candidiasis, f/u GI prn. Blood pressure has been controlled on Metoprolol. Hx of LLE DVT related to elevated Plt , on Eliquis chronically. Last exacerbation of COPD was 07/2014, otherwise  she is stable with occasional chronic hacking cough.   Past Medical History  Diagnosis Date  . Macular degeneration   . COPD (chronic obstructive pulmonary disease) ([place])   . Hypertension   . Coronary artery disease   . Hypercholesterolemia   . GERD (gastroesophageal reflux disease)   . Asthmatic bronchitis   . History of colonic polyps   . DJD (degenerative joint disease)   . Lumbar back pain   . Osteoporosis   . History of transient ischemic attack (TIA)   . Anxiety   . Essential thrombocythemia ([place])   . Diverticulosis 05/10/2012  . Osteoarthrosis, unspecified whether generalized or localized, unspecified site 05/09/2012  . Memory loss 05/09/2012  . Abnormal weight gain 05/09/2012  . Phlebitis and thrombophlebitis of other deep vessels of lower extremities 07/10/2010  . Essential thrombocythemia ([place]) 06/09/2009  . Depression   . Esophageal stricture   . Achalasia 10/15/2014  . Essential thrombocythemia ([place]) 12/28/2014    1,000 mg on Mondays, 500 mg on Sundays, wednesdays, Fridays  Updated 12/28/14   Past Surgical History  Procedure Laterality Date  . Appendectomy    . Cholecystectomy    . [place] and bso  08/21/1995  . Cataract extraction w/ intraocular lens  implant, bilateral    . Abdominal hysterectomy    . Hip arthroplasty Right 09/09/2012    Procedure: ARTHROPLASTY BIPOLAR HIP;  Surgeon: Mauri Pole, MD;  Location: [facility];  Service: Orthopedics;  Laterality: Right;  . Esophagogastroduodenoscopy (egd) with esophageal dilation N/A 11/25/2012    Procedure: ESOPHAGOGASTRODUODENOSCOPY (EGD) WITH ESOPHAGEAL DILATION;  Surgeon: Jerene Bears, MD;  Location: [facility] ENDOSCOPY;  Service: Gastroenterology;  Laterality: N/A;  . Esophagogastroduodenoscopy N/A 07/26/2013    Procedure: ESOPHAGOGASTRODUODENOSCOPY (EGD);  Surgeon: Irene Shipper, MD;  Location: [facility] ENDOSCOPY;  Service: Endoscopy;  Laterality: N/A;  . Esophageal manometry N/A 08/25/2013    Procedure: ESOPHAGEAL MANOMETRY (EM);   Surgeon: Jerene Bears, MD;  Location: [facility] ENDOSCOPY;  Service: Gastroenterology;  Laterality: N/A;  . Esophagogastroduodenoscopy (egd) with propofol N/A 09/18/2013    Procedure: ESOPHAGOGASTRODUODENOSCOPY (EGD) WITH PROPOFOL;  Surgeon: Lafayette Dragon, MD;  Location: [facility] END[facility];  Service: Endoscopy;  Laterality: N/A;  . Botox injection N/A 09/18/2013    Procedure: BOTOX INJECTION;  Surgeon: Lafayette Dragon, MD;  Location: [facility];  Service: Endoscopy;  Laterality: N/A;  . Esophagogastroduodenoscopy N/A 10/16/2014    Procedure: ESOPHAGOGASTRODUODENOSCOPY (EGD);  Surgeon: Inda Castle, MD;  Location: [facility];  Service: Endoscopy;  Laterality: N/A;  with botox  . Botox injection N/A 10/16/2014    Procedure: BOTOX INJECTION;  Surgeon: Inda Castle, MD;  Location: [facility];  Service: Endoscopy;  Laterality: N/A;    Allergies  Allergen Reactions  . Sulfonamide Derivatives Other (See Comments)    Pt is unsure of reaction      Medication List       This list is accurate as of: 08/02/15  4:14 PM.  Always use your most recent med list.               acetaminophen 325 MG tablet  Commonly known as:  TYLENOL  Take 650 mg by mouth every 4 (four) hours as needed.     albuterol 108 (90 Base) MCG/ACT inhaler  Commonly known as:  PROVENTIL HFA;VENTOLIN HFA  Inhale 2 puffs into the lungs every 6 (six) hours as needed for wheezing or shortness of breath.     atorvastatin 20 MG tablet  Commonly known as:  LIPITOR  Take 20 mg by mouth every evening.     BREO ELLIPTA 100-25 MCG/INH Aepb  Generic drug:  fluticasone furoate-vilanterol  Inhale 1 Dose into the lungs daily.     chlorhexidine 0.12 % solution  Commonly known as:  PERIDEX  Use as directed 15 mLs in the mouth or throat daily.     DENTA 5000 PLUS 1.1 % Crea dental cream  Generic drug:  sodium fluoride  Place 1 application onto teeth at bedtime.     feeding supplement Liqd  Take 90 mLs by mouth 2 (two) times daily  between meals.     HYDROcodone-acetaminophen 10-325 MG tablet  Commonly known as:  NORCO  Take 1 tablet by mouth 4 (four) times daily.     hydroxyurea 500 MG capsule  Commonly known as:  HYDREA  Take 500-1,000 mg by mouth See admin instructions. Take 1000 mg on Monday and 500 mg on Sunday, Wednesday and Friday     metoprolol tartrate 25 MG tablet  Commonly known as:  LOPRESSOR  Take 25 mg by mouth 2 (two) times daily.     montelukast 10 MG tablet  Commonly known as:  SINGULAIR  Take 10 mg by mouth at bedtime.     NAMENDA XR 28 MG Cp24 24 hr capsule  Generic drug:  memantine  Take 28 mg by mouth daily.     NIFEdipine 10 MG capsule  Commonly known as:  PROCARDIA  Take 1 capsule (10 mg total) by mouth 3 (three) times daily before meals.     nitroGLYCERIN 0.4 MG SL tablet  Commonly known as:  NITROSTAT  Place 0.4 mg under the tongue every 5 (five) minutes as needed for chest pain.     omeprazole 20 MG capsule  Commonly known as:  PRILOSEC  Take 20 mg by mouth at bedtime.     PRESERVISION AREDS 2 Caps  Take 1 capsule by mouth 2 (two) times daily.     Rivaroxaban 15 MG Tabs tablet  Commonly known as:  XARELTO  Take 15 mg by mouth daily.     sertraline 25 MG tablet  Commonly known as:  ZOLOFT  Take 25 mg by mouth daily.        Review of Systems  Constitutional: Negative for fever, chills and diaphoresis.  HENT: Positive for hearing loss. Negative for congestion, ear discharge and ear pain.   Eyes: Negative for pain, discharge and redness.  Respiratory: Negative for cough, shortness of breath and wheezing.   Cardiovascular: Negative for chest pain, palpitations and leg swelling.  Gastrointestinal: Negative for nausea, vomiting, abdominal pain and diarrhea.  Genitourinary: Positive for frequency. Negative for dysuria and urgency.  Musculoskeletal: Positive for back pain. Negative for myalgias and neck pain.       Lower extremity weakness. Rods wheelchair.  Skin:  Negative for rash.  Neurological: Negative for dizziness, tremors, seizures, weakness and headaches.  Hematological: Does not bruise/bleed easily.  Psychiatric/Behavioral: Negative for suicidal ideas and hallucinations. The patient is not nervous/anxious.     Immunization History  Administered Date(s) Administered  . Influenza Split 11/16/2010, 12/13/2011  . Influenza Whole 11/27/2009  . Influenza-Unspecified 12/31/2013, 11/18/2014  . PPD Test 09/11/2012  . Pneumococcal Polysaccharide-23 02/28/1988   Pertinent  Health Maintenance Due  Topic Date Due  . PNA vac Low Risk Adult (2 of 2 - PCV13) 02/27/1989  . INFLUENZA VACCINE  09/28/2015  . DEXA SCAN  Completed   Fall Risk  12/28/2014 10/23/2014 11/24/2013  Falls in the past year? Yes Yes Yes  Number falls in past yr: 1 1 2  or more  Injury with Fall? No No -  Risk for fall due to : History of fall(s);Impaired balance/gait;Impaired mobility Impaired mobility History of fall(s);Impaired balance/gait;Impaired vision;Impaired mobility  Follow up Falls prevention discussed Falls evaluation completed -   Functional Status Survey:    Filed Vitals:   08/02/15 1520  BP: 122/72  Pulse: 64  Temp: 97.6 F (36.4 C)  Resp: 16  Height: 5\' 1"  (1.549 m)  Weight: 113 lb 4.8 oz (51.393 kg)   Body mass index is 21.42 kg/(m^2). Physical Exam  Constitutional: She is oriented to person, place, and time. She appears well-developed and well-nourished. No distress.  HENT:  Head: Normocephalic and atraumatic.  Eyes: Conjunctivae and EOM are normal. Pupils are equal, round, and reactive to light.  Neck: Normal range of motion. Neck supple. No JVD present. No thyromegaly present.  Cardiovascular: Normal rate, regular rhythm and normal heart sounds.   Pulmonary/Chest: Effort normal. She has decreased breath sounds. She has no wheezes. She has no rales.  Decreased breath sounds Bilat. Lungs.   Abdominal: Soft. Bowel sounds are normal. There is no  tenderness.  Musculoskeletal: Normal range of motion. She exhibits no edema or tenderness.  Lower back-baseline: pain is well controlled with prn inj, narcotics, w/c   Lymphadenopathy:    She has no cervical adenopathy.  Neurological: She is alert and oriented to person, place, and time. She has normal reflexes. No cranial nerve deficit. She exhibits normal muscle tone. Coordination normal.  Skin: Skin is warm and dry. No  rash noted. She is not diaphoretic. No erythema (LLE).  The right hip surgical scar  Psychiatric: She has a normal mood and affect. Her affect is not angry, not blunt, not labile and not inappropriate. Her speech is not delayed, not tangential and not slurred. She is hyperactive and slowed. She is not agitated, not aggressive, not withdrawn and not combative. Thought content is not paranoid and not delusional. Cognition and memory are impaired. She does not express impulsivity or inappropriate judgment. She exhibits abnormal recent memory.    Labs reviewed:  Recent Labs  10/16/14 0154 10/22/14 05/04/15  NA 143 142 140  K 3.6 3.9 4.5  CL 106  --   --   CO2 26  --   --   GLUCOSE 95  --   --   BUN 17 14 22*  CREATININE 0.82 0.6 0.9  CALCIUM 9.4  --   --     Recent Labs  10/16/14 0154 10/22/14 05/04/15  AST 35 16 14  ALT 20 12 10   ALKPHOS 70 52 56  BILITOT 1.2  --   --   PROT 7.4  --   --   ALBUMIN 4.0  --   --     Recent Labs  10/16/14 0154  12/28/14 12/28/14 1108 02/23/15 04/20/15  WBC 13.4*  < > 8.9 8.9 7.6 8.3  NEUTROABS 10.9*  --   --  6.8  --   --   HGB 14.0  < > 13.4  --  13.1 14.0  HCT 43.3  < > 35* 39.8 38 42  MCV 108.5*  --   --  103*  --   --   PLT 396  < > 424* 424* 334 325  < > = values in this interval not displayed. Lab Results  Component Value Date   TSH 2.89 05/04/2015   Lab Results  Component Value Date   HGBA1C 5.5 10/16/2014   Lab Results  Component Value Date   CHOL 169 09/13/2011   HDL 62.80 09/13/2011   LDLCALC 79  09/13/2011   LDLDIRECT 152.3 06/04/2009   TRIG 137.0 09/13/2011   CHOLHDL 3 09/13/2011    Significant Diagnostic Results in last 30 days:  No results found.  Assessment/Plan  Coronary atherosclerosis Stable, no angina since last seen.   Essential hypertension, benign Controlled, continue Metoprolol 25mg  bid  Left leg DVT Switched to Xeralto since 09/12/12. Left leg  COPD with asthma ([place]) dc'd Epipen prn and Immunotherapy per [facility] 01/17/13 --maintenance, takes Montelukast 10mg  daily, Breo Ellipta I daily inhaled, Ventolin HFA 2 puffs q6h prn.  -last flare up 07/2014  Achalasia of esophagus 09/18/13 Nifedipine 10mg  tid ac meals. 11/17/14 [facility] f/u as neeed Stable. Continue Omeprazole, Nifedipine.    GERD Stable. Continue Omeprazole 20mg  daily. Complicated with achalasia, hx of dilatations  Dementia 11/25/14 MMSE 28/30, continue Namenda 28mg  daily. 05/04/15 TSH 2.89  Essential thrombocythemia ([place]) F/u oncology, continue hydroxyurea 1000mg  q Monday, 500mg  Sun, Wed, Fri. Last Plt 317 06/15/15  BACK PAIN, LUMBAR Long standing spondylosis, prn inj and Norco 10/325 qid for pain control, w/c for mobility except a few steps with walker sometimes.    Family/ staff Communication: continue SNF for care needs.   Labs/tests ordered:  none

## 2015-08-02 NOTE — Assessment & Plan Note (Signed)
Switched to Xeralto since 09/12/12. Left leg 

## 2015-08-02 NOTE — Assessment & Plan Note (Signed)
Controlled, continue Metoprolol 25mg bid 

## 2015-08-02 NOTE — Assessment & Plan Note (Signed)
Long standing spondylosis, prn inj and Norco 10/325 qid for pain control, w/c for mobility except a few steps with walker sometimes 

## 2015-08-02 NOTE — Assessment & Plan Note (Signed)
F/u oncology, continue hydroxyurea 1000mg  q Monday, 500mg  Sun, Wed, Fri. Last Plt 317 06/15/15

## 2015-08-02 NOTE — Assessment & Plan Note (Signed)
11/25/14 MMSE 28/30, continue Namenda 28mg  daily. 05/04/15 TSH 2.89

## 2015-08-02 NOTE — Assessment & Plan Note (Signed)
dc'd Epipen prn and Immunotherapy per [facility] 01/17/13 --maintenance, takes Montelukast 10mg  daily, Breo Ellipta I daily inhaled, Ventolin HFA 2 puffs q6h prn.  -last flare up 07/2014

## 2015-08-02 NOTE — Assessment & Plan Note (Signed)
09/18/13 Nifedipine 10mg  tid ac meals. 11/17/14 [facility] f/u as neeed Stable. Continue Omeprazole, Nifedipine.

## 2015-08-10 DIAGNOSIS — M79671 Pain in right foot: Secondary | ICD-10-CM | POA: Diagnosis not present

## 2015-08-10 DIAGNOSIS — E782 Mixed hyperlipidemia: Secondary | ICD-10-CM | POA: Diagnosis not present

## 2015-08-10 DIAGNOSIS — L84 Corns and callosities: Secondary | ICD-10-CM | POA: Diagnosis not present

## 2015-08-10 DIAGNOSIS — D473 Essential (hemorrhagic) thrombocythemia: Secondary | ICD-10-CM | POA: Diagnosis not present

## 2015-08-10 DIAGNOSIS — M79672 Pain in left foot: Secondary | ICD-10-CM | POA: Diagnosis not present

## 2015-08-10 DIAGNOSIS — B351 Tinea unguium: Secondary | ICD-10-CM | POA: Diagnosis not present

## 2015-09-21 DIAGNOSIS — K219 Gastro-esophageal reflux disease without esophagitis: Secondary | ICD-10-CM | POA: Diagnosis not present

## 2015-10-04 ENCOUNTER — Non-Acute Institutional Stay (SKILLED_NURSING_FACILITY): Payer: Medicare Other | Admitting: Internal Medicine

## 2015-10-04 ENCOUNTER — Encounter: Payer: Self-pay | Admitting: Internal Medicine

## 2015-10-04 DIAGNOSIS — J449 Chronic obstructive pulmonary disease, unspecified: Secondary | ICD-10-CM | POA: Diagnosis not present

## 2015-10-04 DIAGNOSIS — J45909 Unspecified asthma, uncomplicated: Secondary | ICD-10-CM | POA: Diagnosis not present

## 2015-10-04 DIAGNOSIS — I10 Essential (primary) hypertension: Secondary | ICD-10-CM

## 2015-10-04 DIAGNOSIS — F0390 Unspecified dementia without behavioral disturbance: Secondary | ICD-10-CM

## 2015-10-04 NOTE — Progress Notes (Signed)
Location:   [facility] Room Number: N35 Place of Service:  SNF [phone]) Provider:  Dr Jeanmarie Hubert   Patient Care Team: Estill Dooms, MD as PCP - Internal Medicine (Internal Medicine) Annia Belt, MD as Consulting Physician (Hematology and Oncology) Suella Broad, MD as Consulting Physician (Physical Medicine and Rehabilitation) Noralee Space, MD (Pulmonary Disease) [facility] Otho Darner, NP as Nurse Practitioner (Nurse Practitioner) Inda Castle, MD as Consulting Physician (Gastroenterology)  Extended Emergency Contact Information Primary Emergency Contact: Ravert,Gene Address: [address]          [place], [place] [address] [place] Phone: [phone] Work Phone: [phone] Mobile Phone: [phone] Relation: Son Secondary Emergency Contact: [name], [place] Phone: [phone] Relation: Son  Code Status:DNR Goals of care: Advanced Directive information Advanced Directives 07/05/2015  Does patient have an advance directive? Yes  Type of Paramedic of [place];Living will;Out of facility DNR (pink MOST or yellow form)  Does patient want to make changes to advanced directive? No - Patient declined  Copy of advanced directive(s) in chart? Yes  Pre-existing out of facility DNR order (yellow form or pink MOST form) -     Chief Complaint  Patient presents with  . Medical Management of Chronic Issues    routine medication management    HPI:  Pt is a 82 y.o. female seen today for medical management of chronic diseases.  Patient says she has been comfortable. She has chronic complaints of dyspnea. This occurs at any hour. It may be a little worse with exertion. She continues to use a wheelchair for mobility. She says she sleeps okay at night. Appetite has not been good, but she seems reasonably stable with her weight. It is  down 4 pounds in the last form months.   Past Medical History:  Diagnosis Date  . Abnormal weight gain 05/09/2012  . Achalasia 10/15/2014  . Anxiety   . Asthmatic bronchitis   . COPD (chronic obstructive pulmonary disease) ([place])   . Coronary artery disease   . Depression   . Diverticulosis 05/10/2012  . DJD (degenerative joint disease)   . Esophageal stricture   . Essential thrombocythemia ([place])   . Essential thrombocythemia ([place]) 06/09/2009  . Essential thrombocythemia ([place]) 12/28/2014   1,000 mg on Mondays, 500 mg on Sundays, wednesdays, Fridays  Updated 12/28/14  . GERD (gastroesophageal reflux disease)   . History of colonic polyps   . History of transient ischemic attack (TIA)   . Hypercholesterolemia   . Hypertension   . Lumbar back pain   . Macular degeneration   . Memory loss 05/09/2012  . Osteoarthrosis, unspecified whether generalized or localized, unspecified site 05/09/2012  . Osteoporosis   . Phlebitis and thrombophlebitis of other deep vessels of lower extremities 07/10/2010   Past Surgical History:  Procedure Laterality Date  . ABDOMINAL HYSTERECTOMY    . APPENDECTOMY    . BOTOX INJECTION N/A 09/18/2013   Procedure: BOTOX INJECTION;  Surgeon: Lafayette Dragon, MD;  Location: [facility] END[facility]COPY;  Service: Endoscopy;  Laterality: N/A;  . BOTOX INJECTION N/A 10/16/2014   Procedure: BOTOX INJECTION;  Surgeon: Inda Castle, MD;  Location: [facility];  Service: Endoscopy;  Laterality: N/A;  . CATARACT EXTRACTION W/ INTRAOCULAR LENS  IMPLANT, BILATERAL    . CHOLECYSTECTOMY    . ESOPHAGEAL MANOMETRY N/A 08/25/2013  Procedure: ESOPHAGEAL MANOMETRY (EM);  Surgeon: Jerene Bears, MD;  Location: [facility];  Service: Gastroenterology;  Laterality: N/A;  . ESOPHAGOGASTRODUODENOSCOPY N/A 07/26/2013   Procedure: ESOPHAGOGASTRODUODENOSCOPY (EGD);  Surgeon: Irene Shipper, MD;  Location: [facility] ENDOSCOPY;  Service: Endoscopy;  Laterality: N/A;  . ESOPHAGOGASTRODUODENOSCOPY N/A 10/16/2014    Procedure: ESOPHAGOGASTRODUODENOSCOPY (EGD);  Surgeon: Inda Castle, MD;  Location: [facility];  Service: Endoscopy;  Laterality: N/A;  with botox  . ESOPHAGOGASTRODUODENOSCOPY (EGD) WITH ESOPHAGEAL DILATION N/A 11/25/2012   Procedure: ESOPHAGOGASTRODUODENOSCOPY (EGD) WITH ESOPHAGEAL DILATION;  Surgeon: Jerene Bears, MD;  Location: [facility];  Service: Gastroenterology;  Laterality: N/A;  . ESOPHAGOGASTRODUODENOSCOPY (EGD) WITH PROPOFOL N/A 09/18/2013   Procedure: ESOPHAGOGASTRODUODENOSCOPY (EGD) WITH PROPOFOL;  Surgeon: Lafayette Dragon, MD;  Location: [facility];  Service: Endoscopy;  Laterality: N/A;  . HIP ARTHROPLASTY Right 09/09/2012   Procedure: ARTHROPLASTY BIPOLAR HIP;  Surgeon: Mauri Pole, MD;  Location: [facility];  Service: Orthopedics;  Laterality: Right;  . TAH and BSO  08/21/1995    Allergies  Allergen Reactions  . Sulfonamide Derivatives Other (See Comments)    Pt is unsure of reaction      Medication List       Accurate as of 10/04/15 11:59 AM. Always use your most recent med list.          acetaminophen 325 MG tablet Commonly known as:  TYLENOL Take 650 mg by mouth every 4 (four) hours as needed.   albuterol 108 (90 Base) MCG/ACT inhaler Commonly known as:  PROVENTIL HFA;VENTOLIN HFA Inhale 2 puffs into the lungs every 6 (six) hours as needed for wheezing or shortness of breath.   atorvastatin 20 MG tablet Commonly known as:  LIPITOR Take 20 mg by mouth every evening.   chlorhexidine 0.12 % solution Commonly known as:  PERIDEX Use as directed 15 mLs in the mouth or throat daily.   DENTA 5000 PLUS 1.1 % Crea dental cream Generic drug:  sodium fluoride Place 1 application onto teeth at bedtime.   feeding supplement Liqd Take 90 mLs by mouth 2 (two) times daily between meals.   HYDROcodone-acetaminophen 10-325 MG tablet Commonly known as:  NORCO Take 1 tablet by mouth 4 (four) times daily.   hydroxyurea 500 MG capsule Commonly known as:   HYDREA Take 500-1,000 mg by mouth See admin instructions. Take 1000 mg on Monday and 500 mg on Sunday, Wednesday and Friday   metoprolol tartrate 25 MG tablet Commonly known as:  LOPRESSOR Take 25 mg by mouth 2 (two) times daily.   montelukast 10 MG tablet Commonly known as:  SINGULAIR Take 10 mg by mouth at bedtime.   NAMENDA XR 28 MG Cp24 24 hr capsule Generic drug:  memantine Take 28 mg by mouth daily.   NIFEdipine 10 MG capsule Commonly known as:  PROCARDIA Take 10 mg by mouth. Squeeze contents of one capsule uner tongue 15-30 minutes before meals 3 times a day   nitroGLYCERIN 0.4 MG SL tablet Commonly known as:  NITROSTAT Place 0.4 mg under the tongue every 5 (five) minutes as needed for chest pain.   omeprazole 20 MG capsule Commonly known as:  PRILOSEC Take 20 mg by mouth at bedtime.   PRESERVISION AREDS 2 Caps Take 1 capsule by mouth 2 (two) times daily.   Rivaroxaban 15 MG Tabs tablet Commonly known as:  XARELTO Take 15 mg by mouth daily.   sertraline 25 MG tablet Commonly known as:  ZOLOFT Take 25 mg by mouth  daily.       Review of Systems  Constitutional: Positive for fever. Negative for chills and diaphoresis.  HENT: Positive for hearing loss. Negative for congestion, ear discharge, ear pain, nosebleeds, sore throat and tinnitus.   Eyes: Negative for photophobia, pain, discharge and redness.  Respiratory: Positive for shortness of breath (Morning are worse, but she remains dyspneic throughout the day.). Negative for cough, wheezing and stridor.   Cardiovascular: Negative for chest pain, palpitations and leg swelling.  Gastrointestinal: Negative for abdominal pain, blood in stool, constipation, diarrhea, nausea and vomiting.  Endocrine: Negative for polydipsia.  Genitourinary: Positive for frequency. Negative for dysuria, flank pain, hematuria and urgency.  Musculoskeletal: Positive for back pain and gait problem. Negative for myalgias and neck pain.        Lower extremity weakness. [place] wheelchair.  Skin: Negative for rash.  Allergic/Immunologic: Negative for environmental allergies.  Neurological: Negative for dizziness, tremors, seizures, weakness and headaches.  Hematological: Negative.  Does not bruise/bleed easily.  Psychiatric/Behavioral: Positive for confusion. Negative for hallucinations and suicidal ideas. The patient is nervous/anxious.     Immunization History  Administered Date(s) Administered  . Influenza Split 11/16/2010, 12/13/2011  . Influenza Whole 11/27/2009  . Influenza-Unspecified 12/31/2013, 11/18/2014  . PPD Test 09/11/2012  . Pneumococcal Polysaccharide-23 02/28/1988   Pertinent  Health Maintenance Due  Topic Date Due  . PNA vac Low Risk Adult (2 of 2 - PCV13) 02/27/1989  . INFLUENZA VACCINE  09/28/2015  . DEXA SCAN  Completed   Fall Risk  12/28/2014 10/23/2014 11/24/2013  Falls in the past year? Yes Yes Yes  Number falls in past yr: 1 1 2  or more  Injury with Fall? No No -  Risk for fall due to : History of fall(s);Impaired balance/gait;Impaired mobility Impaired mobility History of fall(s);Impaired balance/gait;Impaired vision;Impaired mobility  Follow up Falls prevention discussed Falls evaluation completed -   Functional Status Survey:    Vitals:   10/04/15 1146  BP: 128/80  Pulse: 70  Resp: 20  Temp: 97.9 F (36.6 C)  Weight: 109 lb (49.4 kg)  Height: 5\' 1"  (1.549 m)   Body mass index is 20.6 kg/m. Physical Exam  Constitutional: She is oriented to person, place, and time. She appears well-developed and well-nourished. No distress.  HENT:  Head: Normocephalic and atraumatic.  Eyes: Conjunctivae and EOM are normal. Pupils are equal, round, and reactive to light.  Neck: Normal range of motion. Neck supple. No JVD present. No thyromegaly present.  Cardiovascular: Normal rate, regular rhythm and normal heart sounds.   Pulmonary/Chest: Effort normal. She has decreased breath sounds. She has no  wheezes. She has no rales.  Decreased breath sounds Bilat. Lungs.   Abdominal: Soft. Bowel sounds are normal. There is no tenderness.  Musculoskeletal: Normal range of motion. She exhibits no edema or tenderness.  Lower back-baseline: pain at end of day occasionally.   Lymphadenopathy:    She has no cervical adenopathy.  Neurological: She is alert and oriented to person, place, and time. She has normal reflexes. No cranial nerve deficit. She exhibits normal muscle tone. Coordination normal.  8/ 26/16 BIMS score 14/15  Skin: Skin is warm and dry. No rash noted. She is not diaphoretic. No erythema (LLE).  The right hip surgical incision healed.   Psychiatric: She has a normal mood and affect. Her affect is not angry, not blunt, not labile and not inappropriate. Her speech is not delayed, not tangential and not slurred. She is not agitated, not aggressive, not  withdrawn and not combative. Thought content is not paranoid and not delusional. Cognition and memory are impaired. She does not express impulsivity or inappropriate judgment. She exhibits abnormal recent memory.    Labs reviewed:  Recent Labs  10/16/14 0154 10/22/14 05/04/15  NA 143 142 140  K 3.6 3.9 4.5  CL 106  --   --   CO2 26  --   --   GLUCOSE 95  --   --   BUN 17 14 22*  CREATININE 0.82 0.6 0.9  CALCIUM 9.4  --   --     Recent Labs  10/16/14 0154 10/22/14 05/04/15  AST 35 16 14  ALT 20 12 10   ALKPHOS 70 52 56  BILITOT 1.2  --   --   PROT 7.4  --   --   ALBUMIN 4.0  --   --     Recent Labs  10/16/14 0154  12/28/14 12/28/14 1108 02/23/15 04/20/15  WBC 13.4*  < > 8.9 8.9 7.6 8.3  NEUTROABS 10.9*  --   --  6.8  --   --   HGB 14.0  < > 13.4  --  13.1 14.0  HCT 43.3  < > 35* 39.8 38 42  MCV 108.5*  --   --  103*  --   --   PLT 396  < > 424* 424* 334 325  < > = values in this interval not displayed. Lab Results  Component Value Date   TSH 2.89 05/04/2015   Lab Results  Component Value Date   HGBA1C 5.5  10/16/2014   Lab Results  Component Value Date   CHOL 169 09/13/2011   HDL 62.80 09/13/2011   LDLCALC 79 09/13/2011   LDLDIRECT 152.3 06/04/2009   TRIG 137.0 09/13/2011   CHOLHDL 3 09/13/2011    Significant Diagnostic Results in last 30 days:  No results found.  Assessment/Plan  1. COPD with asthma ([place]) Contributes to dyspnea  2. Dementia, without behavioral disturbance Chronic and slowly progressive  3. Essential hypertension, benign Controlled

## 2015-10-05 DIAGNOSIS — D473 Essential (hemorrhagic) thrombocythemia: Secondary | ICD-10-CM | POA: Diagnosis not present

## 2015-10-05 LAB — CBC AND DIFFERENTIAL
HEMATOCRIT: 36 % (ref 36–46)
Hemoglobin: 11.8 g/dL — AB (ref 12.0–16.0)
PLATELETS: 271 10*3/uL (ref 150–399)
WBC: 7.3 10*3/mL

## 2015-10-06 ENCOUNTER — Telehealth: Payer: Self-pay | Admitting: *Deleted

## 2015-10-06 NOTE — Telephone Encounter (Signed)
Dr Beryle Beams received labs from [facility] per [facility] - stated pt should "stay on same dose Hydrea". Called and taked to [place], nurse at [facility] - pt "stay on same dose Hydrea". Voiced understanding.

## 2015-11-19 ENCOUNTER — Non-Acute Institutional Stay (SKILLED_NURSING_FACILITY): Payer: Medicare Other | Admitting: Nurse Practitioner

## 2015-11-19 ENCOUNTER — Encounter: Payer: Self-pay | Admitting: Nurse Practitioner

## 2015-11-19 DIAGNOSIS — F418 Other specified anxiety disorders: Secondary | ICD-10-CM | POA: Diagnosis not present

## 2015-11-19 DIAGNOSIS — F0390 Unspecified dementia without behavioral disturbance: Secondary | ICD-10-CM | POA: Diagnosis not present

## 2015-11-19 DIAGNOSIS — K220 Achalasia of cardia: Secondary | ICD-10-CM | POA: Diagnosis not present

## 2015-11-19 DIAGNOSIS — I82402 Acute embolism and thrombosis of unspecified deep veins of left lower extremity: Secondary | ICD-10-CM

## 2015-11-19 DIAGNOSIS — J45909 Unspecified asthma, uncomplicated: Secondary | ICD-10-CM

## 2015-11-19 DIAGNOSIS — M150 Primary generalized (osteo)arthritis: Secondary | ICD-10-CM | POA: Diagnosis not present

## 2015-11-19 DIAGNOSIS — I10 Essential (primary) hypertension: Secondary | ICD-10-CM

## 2015-11-19 DIAGNOSIS — J449 Chronic obstructive pulmonary disease, unspecified: Secondary | ICD-10-CM

## 2015-11-19 DIAGNOSIS — I2510 Atherosclerotic heart disease of native coronary artery without angina pectoris: Secondary | ICD-10-CM | POA: Diagnosis not present

## 2015-11-19 DIAGNOSIS — F32A Depression, unspecified: Secondary | ICD-10-CM

## 2015-11-19 DIAGNOSIS — M8949 Other hypertrophic osteoarthropathy, multiple sites: Secondary | ICD-10-CM

## 2015-11-19 DIAGNOSIS — M159 Polyosteoarthritis, unspecified: Secondary | ICD-10-CM

## 2015-11-19 DIAGNOSIS — F329 Major depressive disorder, single episode, unspecified: Secondary | ICD-10-CM

## 2015-11-19 DIAGNOSIS — D473 Essential (hemorrhagic) thrombocythemia: Secondary | ICD-10-CM

## 2015-11-19 DIAGNOSIS — F419 Anxiety disorder, unspecified: Secondary | ICD-10-CM

## 2015-11-19 NOTE — Progress Notes (Signed)
Location:  [facility] Room Number: 35 Place of Service:  SNF (31) Provider:  [name], Manxie  NP  Jeanmarie Hubert, MD  Patient Care Team: Estill Dooms, MD as PCP - General (Internal Medicine) Estill Dooms, MD as PCP - Internal Medicine (Internal Medicine) Annia Belt, MD as Consulting Physician (Hematology and Oncology) Suella Broad, MD as Consulting Physician (Physical Medicine and Rehabilitation) Noralee Space, MD (Pulmonary Disease) [facility] Otho Darner, NP as Nurse Practitioner (Nurse Practitioner) Inda Castle, MD as Consulting Physician (Gastroenterology)  Extended Emergency Contact Information Primary Emergency Contact: Hagos,Gene Address: [address]          [place], [place] [address] [place] Phone: [phone] Work Phone: [phone] Mobile Phone: [phone] Relation: Son Secondary Emergency Contact: [name], [place] Phone: [phone] Relation: Son  Code Status:  DNR Goals of care: Advanced Directive information Advanced Directives 11/19/2015  Does patient have an advance directive? Yes  Type of Paramedic of [place];Living will;Out of facility DNR (pink MOST or yellow form)  Does patient want to make changes to advanced directive? No - Patient declined  Copy of advanced directive(s) in chart? Yes  Pre-existing out of facility DNR order (yellow form or pink MOST form) -     Chief Complaint  Patient presents with  . Medical Management of Chronic Issues    HPI:  Pt is a 82 y.o. female seen today for medical management of chronic diseases.    Hx of dementia, has been stable on Namenda in SNF. GERD has been stable, hx of dilatations for achalasia, treated for candidiasis, f/u GI prn. Blood pressure has been controlled on Metoprolol. Hx of LLE DVT related to elevated Plt , on Eliquis chronically. Last  exacerbation of COPD was 07/2014, otherwise she is stable with occasional chronic hacking cough.   Past Medical History:  Diagnosis Date  . Abnormal weight gain 05/09/2012  . Achalasia 10/15/2014  . Anxiety   . Asthmatic bronchitis   . COPD (chronic obstructive pulmonary disease) ([place])   . Coronary artery disease   . Depression   . Diverticulosis 05/10/2012  . DJD (degenerative joint disease)   . Esophageal stricture   . Essential thrombocythemia ([place])   . Essential thrombocythemia ([place]) 06/09/2009  . Essential thrombocythemia ([place]) 12/28/2014   1,000 mg on Mondays, 500 mg on Sundays, wednesdays, Fridays  Updated 12/28/14  . GERD (gastroesophageal reflux disease)   . History of colonic polyps   . History of transient ischemic attack (TIA)   . Hypercholesterolemia   . Hypertension   . Lumbar back pain   . Macular degeneration   . Memory loss 05/09/2012  . Osteoarthrosis, unspecified whether generalized or localized, unspecified site 05/09/2012  . Osteoporosis   . Phlebitis and thrombophlebitis of other deep vessels of lower extremities 07/10/2010   Past Surgical History:  Procedure Laterality Date  . ABDOMINAL HYSTERECTOMY    . APPENDECTOMY    . BOTOX INJECTION N/A 09/18/2013   Procedure: BOTOX INJECTION;  Surgeon: Lafayette Dragon, MD;  Location: [facility] ENDOSCOPY;  Service: Endoscopy;  Laterality: N/A;  . BOTOX INJECTION N/A 10/16/2014   Procedure: BOTOX INJECTION;  Surgeon: Inda Castle, MD;  Location: [facility] ENDOSCOPY;  Service: Endoscopy;  Laterality: N/A;  . CATARACT EXTRACTION W/ INTRAOCULAR LENS  IMPLANT, BILATERAL    . CHOLECYSTECTOMY    .  ESOPHAGEAL MANOMETRY N/A 08/25/2013   Procedure: ESOPHAGEAL MANOMETRY (EM);  Surgeon: Jerene Bears, MD;  Location: [facility] ENDOSCOPY;  Service: Gastroenterology;  Laterality: N/A;  . ESOPHAGOGASTRODUODENOSCOPY N/A 07/26/2013   Procedure: ESOPHAGOGASTRODUODENOSCOPY (EGD);  Surgeon: Irene Shipper, MD;  Location: [facility] ENDOSCOPY;  Service: Endoscopy;   Laterality: N/A;  . ESOPHAGOGASTRODUODENOSCOPY N/A 10/16/2014   Procedure: ESOPHAGOGASTRODUODENOSCOPY (EGD);  Surgeon: Inda Castle, MD;  Location: [facility];  Service: Endoscopy;  Laterality: N/A;  with botox  . ESOPHAGOGASTRODUODENOSCOPY (EGD) WITH ESOPHAGEAL DILATION N/A 11/25/2012   Procedure: ESOPHAGOGASTRODUODENOSCOPY (EGD) WITH ESOPHAGEAL DILATION;  Surgeon: Jerene Bears, MD;  Location: [facility] END[facility];  Service: Gastroenterology;  Laterality: N/A;  . ESOPHAGOGASTRODUODENOSCOPY (EGD) WITH PROPOFOL N/A 09/18/2013   Procedure: ESOPHAGOGASTRODUODENOSCOPY (EGD) WITH PROPOFOL;  Surgeon: Lafayette Dragon, MD;  Location: [facility] END[facility];  Service: Endoscopy;  Laterality: N/A;  . HIP ARTHROPLASTY Right 09/09/2012   Procedure: ARTHROPLASTY BIPOLAR HIP;  Surgeon: Mauri Pole, MD;  Location: [facility] ORS;  Service: Orthopedics;  Laterality: Right;  . TAH and BSO  08/21/1995    Allergies  Allergen Reactions  . Sulfonamide Derivatives Other (See Comments)    Pt is unsure of reaction      Medication List       Accurate as of 11/19/15  1:28 PM. Always use your most recent med list.          acetaminophen 325 MG tablet Commonly known as:  TYLENOL Take 650 mg by mouth every 4 (four) hours as needed.   albuterol 108 (90 Base) MCG/ACT inhaler Commonly known as:  PROVENTIL HFA;VENTOLIN HFA Inhale 2 puffs into the lungs every 6 (six) hours as needed for wheezing or shortness of breath.   atorvastatin 20 MG tablet Commonly known as:  LIPITOR Take 20 mg by mouth every evening.   chlorhexidine 0.12 % solution Commonly known as:  PERIDEX Use as directed 15 mLs in the mouth or throat daily.   DENTA 5000 PLUS 1.1 % Crea dental cream Generic drug:  sodium fluoride Place 1 application onto teeth at bedtime.   feeding supplement Liqd Take 90 mLs by mouth 2 (two) times daily between meals.   HYDROcodone-acetaminophen 10-325 MG tablet Commonly known as:  NORCO Take 1 tablet by mouth 4 (four) times  daily.   hydroxyurea 500 MG capsule Commonly known as:  HYDREA Take 500-1,000 mg by mouth See admin instructions. Take 1000 mg on Monday and 500 mg on Sunday, Wednesday and Friday   metoprolol tartrate 25 MG tablet Commonly known as:  LOPRESSOR Take 25 mg by mouth 2 (two) times daily.   montelukast 10 MG tablet Commonly known as:  SINGULAIR Take 10 mg by mouth at bedtime.   NAMENDA XR 28 MG Cp24 24 hr capsule Generic drug:  memantine Take 28 mg by mouth daily.   NIFEdipine 10 MG capsule Commonly known as:  PROCARDIA Take 10 mg by mouth. Squeeze contents of one capsule uner tongue 15-30 minutes before meals 3 times a day   nitroGLYCERIN 0.4 MG SL tablet Commonly known as:  NITROSTAT Place 0.4 mg under the tongue every 5 (five) minutes as needed for chest pain.   omeprazole 20 MG capsule Commonly known as:  PRILOSEC Take 20 mg by mouth at bedtime.   PRESERVISION AREDS 2 Caps Take 1 capsule by mouth 2 (two) times daily.   Rivaroxaban 15 MG Tabs tablet Commonly known as:  XARELTO Take 15 mg by mouth daily.   sertraline 25 MG tablet Commonly known as:  ZOLOFT Take 25 mg by mouth daily.       Review of Systems  Constitutional: Positive for fever. Negative for chills and diaphoresis.  HENT: Positive for hearing loss. Negative for congestion, ear discharge, ear pain, nosebleeds, sore throat and tinnitus.   Eyes: Negative for photophobia, pain, discharge and redness.  Respiratory: Positive for shortness of breath (Morning are worse, but she remains dyspneic throughout the day.). Negative for cough, wheezing and stridor.   Cardiovascular: Negative for chest pain, palpitations and leg swelling.  Gastrointestinal: Negative for abdominal pain, blood in stool, constipation, diarrhea, nausea and vomiting.  Endocrine: Negative for polydipsia.  Genitourinary: Positive for frequency. Negative for dysuria, flank pain, hematuria and urgency.  Musculoskeletal: Positive for back pain  and gait problem. Negative for myalgias and neck pain.       Lower extremity weakness. [place] wheelchair.  Skin: Negative for rash.  Allergic/Immunologic: Negative for environmental allergies.  Neurological: Negative for dizziness, tremors, seizures, weakness and headaches.  Hematological: Negative.  Does not bruise/bleed easily.  Psychiatric/Behavioral: Positive for confusion. Negative for hallucinations and suicidal ideas. The patient is nervous/anxious.     Immunization History  Administered Date(s) Administered  . Influenza Split 11/16/2010, 12/13/2011  . Influenza Whole 11/27/2009  . Influenza-Unspecified 12/31/2013, 11/18/2014  . PPD Test 09/11/2012  . Pneumococcal Polysaccharide-23 02/28/1988   Pertinent  Health Maintenance Due  Topic Date Due  . PNA vac Low Risk Adult (2 of 2 - PCV13) 02/27/1989  . INFLUENZA VACCINE  09/28/2015  . DEXA SCAN  Completed   Fall Risk  10/04/2015 12/28/2014 10/23/2014 11/24/2013  Falls in the past year? No Yes Yes Yes  Number falls in past yr: - 1 1 2  or more  Injury with Fall? - No No -  Risk for fall due to : Impaired mobility;Impaired balance/gait History of fall(s);Impaired balance/gait;Impaired mobility Impaired mobility History of fall(s);Impaired balance/gait;Impaired vision;Impaired mobility  Follow up - Falls prevention discussed Falls evaluation completed -   Functional Status Survey:    Vitals:   11/19/15 1215  BP: 120/78  Pulse: 75  Resp: 18  Temp: 97.8 F (36.6 C)  Weight: 110 lb 1.6 oz (49.9 kg)  Height: 5\' 1"  (1.549 m)   Body mass index is 20.8 kg/m. Physical Exam  Constitutional: She is oriented to person, place, and time. She appears well-developed and well-nourished. No distress.  HENT:  Head: Normocephalic and atraumatic.  Eyes: Conjunctivae and EOM are normal. Pupils are equal, round, and reactive to light.  Neck: Normal range of motion. Neck supple. No JVD present. No thyromegaly present.  Cardiovascular: Normal  rate, regular rhythm and normal heart sounds.   Pulmonary/Chest: Effort normal. She has decreased breath sounds. She has no wheezes. She has no rales.  Decreased breath sounds Bilat. Lungs.   Abdominal: Soft. Bowel sounds are normal. There is no tenderness.  Musculoskeletal: Normal range of motion. She exhibits no edema or tenderness.  Lower back-baseline: pain is well controlled with prn inj, narcotics, w/c   Lymphadenopathy:    She has no cervical adenopathy.  Neurological: She is alert and oriented to person, place, and time. She has normal reflexes. No cranial nerve deficit. She exhibits normal muscle tone. Coordination normal.  8/ 26/16 BIMS score 14/15  Skin: Skin is warm and dry. No rash noted. She is not diaphoretic. No erythema (LLE).  The right hip surgical scar  Psychiatric: She has a normal mood and affect. Her affect is not angry, not blunt, not labile and not inappropriate.  Her speech is not delayed, not tangential and not slurred. She is hyperactive and slowed. She is not agitated, not aggressive, not withdrawn and not combative. Thought content is not paranoid and not delusional. Cognition and memory are impaired. She does not express impulsivity or inappropriate judgment. She exhibits abnormal recent memory.    Labs reviewed:  Recent Labs  05/04/15  NA 140  K 4.5  BUN 22*  CREATININE 0.9    Recent Labs  05/04/15  AST 14  ALT 10  ALKPHOS 56    Recent Labs  12/28/14 1108 02/23/15 04/20/15 10/05/15  WBC 8.9 7.6 8.3 7.3  NEUTROABS 6.8  --   --   --   HGB  --  13.1 14.0 11.8*  HCT 39.8 38 42 36  MCV 103*  --   --   --   PLT 424* 334 325 271   Lab Results  Component Value Date   TSH 2.89 05/04/2015   Lab Results  Component Value Date   HGBA1C 5.5 10/16/2014   Lab Results  Component Value Date   CHOL 169 09/13/2011   HDL 62.80 09/13/2011   LDLCALC 79 09/13/2011   LDLDIRECT 152.3 06/04/2009   TRIG 137.0 09/13/2011   CHOLHDL 3 09/13/2011     Significant Diagnostic Results in last 30 days:  No results found.  Assessment/Plan There are no diagnoses linked to this encounter.Essential hypertension, benign Controlled, continue Metoprolol 25mg  bid  Coronary atherosclerosis Stable, no angina since last seen.   Left leg DVT Switched to [place] since 09/12/12. Left leg  COPD with asthma ([place]) dc'd Epipen prn and Immunotherapy per [facility] 01/17/13 --maintenance, takes Montelukast 10mg  daily, Breo Ellipta I daily inhaled, Ventolin HFA 2 puffs q6h prn.  -last flare up 07/2014   Achalasia of esophagus 09/18/13 Nifedipine 10mg  tid ac meals. 11/17/14 [facility] f/u as neeed Stable. Continue Omeprazole, Nifedipine.     Dementia 11/25/14 MMSE 28/30, continue Namenda 28mg  daily. 05/04/15 TSH 2.89  Osteoarthritis Long standing spondylosis, prn inj and Norco 10/325 qid for pain control, w/c for mobility except a few steps with walker sometimes.   Essential thrombocythemia [facility]) F/u oncology, continue hydroxyurea 1000mg  q Monday, 500mg  Sun, Wed, Fri. Last Plt 271 10/05/15  Anxiety and depression Her mood is stable, continue Sertraline 25mg  since 10/12/14([facility]), off Mirtazapine. Continue prn Lorazepam. 05/04/15 TSH 2.89     Family/ staff Communication: continue SNF for care assistance.   Labs/tests ordered:  none

## 2015-11-19 NOTE — Assessment & Plan Note (Signed)
Controlled, continue Metoprolol 25mg bid 

## 2015-11-19 NOTE — Assessment & Plan Note (Signed)
09/18/13 Nifedipine 10mg  tid ac meals. 11/17/14 [facility] f/u as neeed Stable. Continue Omeprazole, Nifedipine.

## 2015-11-19 NOTE — Assessment & Plan Note (Signed)
Long standing spondylosis, prn inj and Norco 10/325 qid for pain control, w/c for mobility except a few steps with walker sometimes 

## 2015-11-19 NOTE — Assessment & Plan Note (Signed)
11/25/14 MMSE 28/30, continue Namenda 28mg  daily. 05/04/15 TSH 2.89

## 2015-11-19 NOTE — Assessment & Plan Note (Signed)
Her mood is stable, continue Sertraline 25mg  since 10/12/14(GDR), off Mirtazapine. Continue prn Lorazepam. 05/04/15 TSH 2.89

## 2015-11-19 NOTE — Assessment & Plan Note (Signed)
Switched to Xeralto since 09/12/12. Left leg 

## 2015-11-19 NOTE — Assessment & Plan Note (Signed)
Stable, no angina since last seen.

## 2015-11-19 NOTE — Assessment & Plan Note (Signed)
dc'd Epipen prn and Immunotherapy per [facility] 01/17/13 --maintenance, takes Montelukast 10mg  daily, Breo Ellipta I daily inhaled, Ventolin HFA 2 puffs q6h prn.  -last flare up 07/2014

## 2015-11-19 NOTE — Assessment & Plan Note (Signed)
F/u oncology, continue hydroxyurea 1000mg  q Monday, 500mg  Sun, Wed, Fri. Last Plt 271 10/05/15

## 2015-12-02 DIAGNOSIS — D649 Anemia, unspecified: Secondary | ICD-10-CM | POA: Diagnosis not present

## 2015-12-02 LAB — CBC AND DIFFERENTIAL
HEMATOCRIT: 37 % (ref 36–46)
HEMOGLOBIN: 12 g/dL (ref 12.0–16.0)
PLATELETS: 271 10*3/uL (ref 150–399)
WBC: 6.1 10^3/mL

## 2015-12-06 ENCOUNTER — Telehealth: Payer: Self-pay | Admitting: *Deleted

## 2015-12-06 DIAGNOSIS — H5213 Myopia, bilateral: Secondary | ICD-10-CM | POA: Diagnosis not present

## 2015-12-06 DIAGNOSIS — H52223 Regular astigmatism, bilateral: Secondary | ICD-10-CM | POA: Diagnosis not present

## 2015-12-06 DIAGNOSIS — Z961 Presence of intraocular lens: Secondary | ICD-10-CM | POA: Diagnosis not present

## 2015-12-06 DIAGNOSIS — H524 Presbyopia: Secondary | ICD-10-CM | POA: Diagnosis not present

## 2015-12-06 DIAGNOSIS — H353132 Nonexudative age-related macular degeneration, bilateral, intermediate dry stage: Secondary | ICD-10-CM | POA: Diagnosis not present

## 2015-12-06 NOTE — Telephone Encounter (Signed)
Tees Toh and talked to Nancy,LPN - informed to continue current dose of Hydrea and every 2 month CBC/Diff per Dr Beryle Beams. Current labs to be scan - collected 12/02/15.

## 2015-12-16 ENCOUNTER — Encounter: Payer: Self-pay | Admitting: Nurse Practitioner

## 2015-12-16 ENCOUNTER — Non-Acute Institutional Stay (SKILLED_NURSING_FACILITY): Payer: Medicare Other | Admitting: Nurse Practitioner

## 2015-12-16 DIAGNOSIS — D473 Essential (hemorrhagic) thrombocythemia: Secondary | ICD-10-CM | POA: Diagnosis not present

## 2015-12-16 DIAGNOSIS — R600 Localized edema: Secondary | ICD-10-CM

## 2015-12-16 DIAGNOSIS — J449 Chronic obstructive pulmonary disease, unspecified: Secondary | ICD-10-CM | POA: Diagnosis not present

## 2015-12-16 DIAGNOSIS — I82401 Acute embolism and thrombosis of unspecified deep veins of right lower extremity: Secondary | ICD-10-CM | POA: Diagnosis not present

## 2015-12-16 DIAGNOSIS — I10 Essential (primary) hypertension: Secondary | ICD-10-CM

## 2015-12-16 DIAGNOSIS — K220 Achalasia of cardia: Secondary | ICD-10-CM | POA: Diagnosis not present

## 2015-12-16 DIAGNOSIS — F419 Anxiety disorder, unspecified: Secondary | ICD-10-CM

## 2015-12-16 DIAGNOSIS — F418 Other specified anxiety disorders: Secondary | ICD-10-CM | POA: Diagnosis not present

## 2015-12-16 DIAGNOSIS — F0390 Unspecified dementia without behavioral disturbance: Secondary | ICD-10-CM

## 2015-12-16 DIAGNOSIS — M8949 Other hypertrophic osteoarthropathy, multiple sites: Secondary | ICD-10-CM

## 2015-12-16 DIAGNOSIS — M159 Polyosteoarthritis, unspecified: Secondary | ICD-10-CM

## 2015-12-16 DIAGNOSIS — M150 Primary generalized (osteo)arthritis: Secondary | ICD-10-CM

## 2015-12-16 DIAGNOSIS — F329 Major depressive disorder, single episode, unspecified: Secondary | ICD-10-CM

## 2015-12-16 DIAGNOSIS — F32A Depression, unspecified: Secondary | ICD-10-CM

## 2015-12-16 NOTE — Progress Notes (Signed)
Location:  [facility] Room Number: 35 Place of Service:  SNF (31) Provider: Gedalya Jim, [place]  NP  Jeanmarie Hubert, MD  Patient Care Team: Estill Dooms, MD as PCP - General (Internal Medicine) Estill Dooms, MD as PCP - Internal Medicine (Internal Medicine) Annia Belt, MD as Consulting Physician (Hematology and Oncology) Suella Broad, MD as Consulting Physician (Physical Medicine and Rehabilitation) Noralee Space, MD (Pulmonary Disease) [facility] Naina Sleeper Otho Darner, NP as Nurse Practitioner (Nurse Practitioner) Inda Castle, MD as Consulting Physician (Gastroenterology)  Extended Emergency Contact Information Primary Emergency Contact: Rostro,Gene Address: [address]          [place], [place] [address] [place] Phone: [phone] Work Phone: [phone] Mobile Phone: [phone] Relation: Son Secondary Emergency Contact: [name],  [place] Phone: [phone] Relation: Son  Code Status:  DNR Goals of care: Advanced Directive information Advanced Directives 12/16/2015  Does patient have an advance directive? Yes  Type of Paramedic of [place];Living will;Out of facility DNR (pink MOST or yellow form)  Does patient want to make changes to advanced directive? No - Patient declined  Copy of advanced directive(s) in chart? Yes  Pre-existing out of facility DNR order (yellow form or pink MOST form) -     Chief Complaint  Patient presents with  . Medical Management of Chronic Issues    HPI:  Pt is a 82 y.o. female seen today for medical management of chronic diseases.    Hx of dementia, has been stable on Namenda in SNF. GERD has been stable, hx of dilatations for achalasia, treated for candidiasis, f/u GI prn. Blood pressure has been controlled on Metoprolol. Hx of LLE DVT related to elevated Plt , on Eliquis chronically. Last  exacerbation of COPD was 07/2014, otherwise she is stable with occasional chronic hacking cough.    Past Medical History:  Diagnosis Date  . Abnormal weight gain 05/09/2012  . Achalasia 10/15/2014  . Anxiety   . Asthmatic bronchitis   . COPD (chronic obstructive pulmonary disease) ([place])   . Coronary artery disease   . Depression   . Diverticulosis 05/10/2012  . DJD (degenerative joint disease)   . Esophageal stricture   . Essential thrombocythemia ([place])   . Essential thrombocythemia ([place]) 06/09/2009  . Essential thrombocythemia ([place]) 12/28/2014   1,000 mg on Mondays, 500 mg on Sundays, wednesdays, Fridays  Updated 12/28/14  . GERD (gastroesophageal reflux disease)   . History of colonic polyps   . History of transient ischemic attack (TIA)   . Hypercholesterolemia   . Hypertension   . Lumbar back pain   . Macular degeneration   . Memory loss 05/09/2012  . Osteoarthrosis, unspecified whether generalized or localized, unspecified site 05/09/2012  . Osteoporosis   . Phlebitis and thrombophlebitis of other deep vessels of lower extremities 07/10/2010   Past Surgical History:  Procedure Laterality Date  . ABDOMINAL HYSTERECTOMY    . APPENDECTOMY    . BOTOX INJECTION N/A 09/18/2013   Procedure: BOTOX INJECTION;  Surgeon: Lafayette Dragon, MD;  Location: [facility] ENDOSCOPY;  Service: Endoscopy;  Laterality: N/A;  . BOTOX INJECTION N/A 10/16/2014   Procedure: BOTOX INJECTION;  Surgeon: Inda Castle, MD;  [facility]: [facility];  Service: Endoscopy;  Laterality: N/A;  . CATARACT EXTRACTION W/ INTRAOCULAR LENS  IMPLANT, BILATERAL    . CHOLECYSTECTOMY    .  ESOPHAGEAL MANOMETRY N/A 08/25/2013   Procedure: ESOPHAGEAL MANOMETRY (EM);  Surgeon: Jerene Bears, MD;  [facility];  Service: Gastroenterology;  Laterality: N/A;  . ESOPHAGOGASTRODUODENOSCOPY N/A 07/26/2013   Procedure: ESOPHAGOGASTRODUODENOSCOPY (EGD);  Surgeon: Irene Shipper, MD;  Location: [facility] ENDOSCOPY;  Service: Endoscopy;   Laterality: N/A;  . ESOPHAGOGASTRODUODENOSCOPY N/A 10/16/2014   Procedure: ESOPHAGOGASTRODUODENOSCOPY (EGD);  Surgeon: Inda Castle, MD;  Location: [facility];  Service: Endoscopy;  Laterality: N/A;  with botox  . ESOPHAGOGASTRODUODENOSCOPY (EGD) WITH ESOPHAGEAL DILATION N/A 11/25/2012   Procedure: ESOPHAGOGASTRODUODENOSCOPY (EGD) WITH ESOPHAGEAL DILATION;  Surgeon: Jerene Bears, MD;  [facility];  Service: Gastroenterology;  Laterality: N/A;  . ESOPHAGOGASTRODUODENOSCOPY (EGD) WITH PROPOFOL N/A 09/18/2013   Procedure: ESOPHAGOGASTRODUODENOSCOPY (EGD) WITH PROPOFOL;  Surgeon: Lafayette Dragon, MD;  [facility];  Service: Endoscopy;  Laterality: N/A;  . HIP ARTHROPLASTY Right 09/09/2012   Procedure: ARTHROPLASTY BIPOLAR HIP;  Surgeon: Mauri Pole, MD;  [facility]: [facility]S;  Service: Orthopedics;  Laterality: Right;  . TAH and BSO  08/21/1995    Allergies  Allergen Reactions  . Sulfonamide Derivatives Other (See Comments)    Pt is unsure of reaction      Medication List       Accurate as of 12/16/15 11:13 AM. Always use your most recent med list.          acetaminophen 325 MG tablet Commonly known as:  TYLENOL Take 650 mg by mouth every 4 (four) hours as needed.   albuterol 108 (90 Base) MCG/ACT inhaler Commonly known as:  PROVENTIL HFA;VENTOLIN HFA Inhale 2 puffs into the lungs every 6 (six) hours as needed for wheezing or shortness of breath.   atorvastatin 20 MG tablet Commonly known as:  LIPITOR Take 20 mg by mouth every evening.   chlorhexidine 0.12 % solution Commonly known as:  PERIDEX Use as directed 15 mLs in the mouth or throat daily.   feeding supplement Liqd Take 90 mLs by mouth 2 (two) times daily between meals.   HYDROcodone-acetaminophen 10-325 MG tablet Commonly known as:  NORCO Take 1 tablet by mouth 4 (four) times daily.   hydroxyurea 500 MG capsule Commonly known as:  HYDREA Take 500-1,000 mg by mouth See admin instructions. Take  1000 mg on Monday and 500 mg on Sunday, Wednesday and Friday   metoprolol tartrate 25 MG tablet Commonly known as:  LOPRESSOR Take 25 mg by mouth 2 (two) times daily.   montelukast 10 MG tablet Commonly known as:  SINGULAIR Take 10 mg by mouth at bedtime.   NAMENDA XR 28 MG Cp24 24 hr capsule Generic drug:  memantine Take 28 mg by mouth daily.   NIFEdipine 10 MG capsule Commonly known as:  PROCARDIA Take 10 mg by mouth. Squeeze contents of one capsule uner tongue 15-30 minutes before meals 3 times a day   nitroGLYCERIN 0.4 MG SL tablet Commonly known as:  NITROSTAT Place 0.4 mg under the tongue every 5 (five) minutes as needed for chest pain.   omeprazole 20 MG capsule Commonly known as:  PRILOSEC Take 20 mg by mouth at bedtime.   PRESERVISION AREDS 2 Caps Take 1 capsule by mouth 2 (two) times daily.   Rivaroxaban 15 MG Tabs tablet Commonly known as:  XARELTO Take 15 mg by mouth daily.   sertraline 25 MG tablet Commonly known as:  ZOLOFT Take 25 mg by mouth daily.       Review of Systems  Constitutional: Negative for chills, diaphoresis  and fever.  HENT: Positive for hearing loss. Negative for congestion, ear discharge, ear pain, nosebleeds, sore throat and tinnitus.   Eyes: Negative for photophobia, pain, discharge and redness.  Respiratory: Positive for shortness of breath (Morning are worse, but she remains dyspneic throughout the day.). Negative for cough, wheezing and stridor.   Cardiovascular: Negative for chest pain, palpitations and leg swelling.  Gastrointestinal: Negative for abdominal pain, blood in stool, constipation, diarrhea, nausea and vomiting.  Endocrine: Negative for polydipsia.  Genitourinary: Positive for frequency. Negative for dysuria, flank pain, hematuria and urgency.  Musculoskeletal: Positive for back pain and gait problem. Negative for myalgias and neck pain.       Lower extremity weakness. [place] wheelchair.  Skin: Negative for rash.    Allergic/Immunologic: Negative for environmental allergies.  Neurological: Negative for dizziness, tremors, seizures, weakness and headaches.  Hematological: Negative.  Does not bruise/bleed easily.  Psychiatric/Behavioral: Positive for confusion. Negative for hallucinations and suicidal ideas. The patient is nervous/anxious.     Immunization History  Administered Date(s) Administered  . Influenza Split 11/16/2010, 12/13/2011  . Influenza Whole 11/27/2009  . Influenza-Unspecified 12/31/2013, 11/18/2014  . PPD Test 09/11/2012  . Pneumococcal Polysaccharide-23 02/28/1988   Pertinent  Health Maintenance Due  Topic Date Due  . PNA vac Low Risk Adult (2 of 2 - PCV13) 02/27/1989  . INFLUENZA VACCINE  09/28/2015  . DEXA SCAN  Completed   Fall Risk  10/04/2015 12/28/2014 10/23/2014 11/24/2013  Falls in the past year? No Yes Yes Yes  Number falls in past yr: - 1 1 2  or more  Injury with Fall? - No No -  Risk for fall due to : Impaired mobility;Impaired balance/gait History of fall(s);Impaired balance/gait;Impaired mobility Impaired mobility History of fall(s);Impaired balance/gait;Impaired vision;Impaired mobility  Follow up - Falls prevention discussed Falls evaluation completed -   Functional Status Survey:    Vitals:   12/16/15 1104  BP: 120/78  Pulse: 75  Resp: 18  Temp: 97.8 F (36.6 C)  Weight: 109 lb 9.6 oz (49.7 kg)  Height: 5\' 1"  (1.549 m)   Body mass index is 20.71 kg/m. Physical Exam  Constitutional: She is oriented to person, place, and time. She appears well-developed and well-nourished. No distress.  HENT:  Head: Normocephalic and atraumatic.  Eyes: Conjunctivae and EOM are normal. Pupils are equal, round, and reactive to light.  Neck: Normal range of motion. Neck supple. No JVD present. No thyromegaly present.  Cardiovascular: Normal rate, regular rhythm and normal heart sounds.   Pulmonary/Chest: Effort normal. She has decreased breath sounds. She has no wheezes.  She has no rales.  Decreased breath sounds Bilat. Lungs.   Abdominal: Soft. Bowel sounds are normal. There is no tenderness.  Musculoskeletal: Normal range of motion. She exhibits no edema or tenderness.  Lower back-baseline: pain is well controlled with prn inj, narcotics, w/c   Lymphadenopathy:    She has no cervical adenopathy.  Neurological: She is alert and oriented to person, place, and time. She has normal reflexes. No cranial nerve deficit. She exhibits normal muscle tone. Coordination normal.  8/ 26/16 BIMS score 14/15  Skin: Skin is warm and dry. No rash noted. She is not diaphoretic. No erythema (LLE).  The right hip surgical scar  Psychiatric: She has a normal mood and affect. Her affect is not angry, not blunt, not labile and not inappropriate. Her speech is not delayed, not tangential and not slurred. She is hyperactive and slowed. She is not agitated, not aggressive, not withdrawn  and not combative. Thought content is not paranoid and not delusional. Cognition and memory are impaired. She does not express impulsivity or inappropriate judgment. She exhibits abnormal recent memory.    Labs reviewed:  Recent Labs  05/04/15  NA 140  K 4.5  BUN 22*  CREATININE 0.9    Recent Labs  05/04/15  AST 14  ALT 10  ALKPHOS 56    Recent Labs  12/28/14 1108 02/23/15 04/20/15 10/05/15  WBC 8.9 7.6 8.3 7.3  NEUTROABS 6.8  --   --   --   HGB  --  13.1 14.0 11.8*  HCT 39.8 38 42 36  MCV 103*  --   --   --   PLT 424* 334 325 271   Lab Results  Component Value Date   TSH 2.89 05/04/2015   Lab Results  Component Value Date   HGBA1C 5.5 10/16/2014   Lab Results  Component Value Date   CHOL 169 09/13/2011   HDL 62.80 09/13/2011   LDLCALC 79 09/13/2011   LDLDIRECT 152.3 06/04/2009   TRIG 137.0 09/13/2011   CHOLHDL 3 09/13/2011    Significant Diagnostic Results in last 30 days:  No results found.  Assessment/Plan No problem-specific Assessment & Plan notes found for  this encounter.     Family/ staff Communication: continue SNF for care assistance.   Labs/tests ordered:  none

## 2015-12-16 NOTE — Assessment & Plan Note (Signed)
11/25/14 MMSE 28/30, continue Namenda. 05/04/15 TSH 2.89

## 2015-12-16 NOTE — Assessment & Plan Note (Signed)
dc'd Epipen prn and Immunotherapy per [facility] 01/17/13 --maintenance, takes Montelukast 10mg  daily, Breo Ellipta I daily inhaled, Ventolin HFA 2 puffs q6h prn.  -last flare up 07/2014

## 2015-12-16 NOTE — Assessment & Plan Note (Signed)
Controlled, continue Metoprolol 25mg bid 

## 2015-12-16 NOTE — Assessment & Plan Note (Signed)
Her mood is stable, continue Sertraline 25mg  since 10/12/14(GDR), off Mirtazapine. Continue prn Lorazepam. 05/04/15 TSH 2.89

## 2015-12-16 NOTE — Assessment & Plan Note (Signed)
F/u oncology, continue hydroxyurea 1000mg  q Monday, 500mg  Sun, Wed, Fri. Last Plt 271 12/02/15

## 2015-12-16 NOTE — Assessment & Plan Note (Signed)
Long standing spondylosis, prn inj and Norco 10/325 qid for pain control, w/c for mobility except a few steps with walker sometimes 

## 2015-12-16 NOTE — Assessment & Plan Note (Signed)
Switched to [place] since 09/12/12. Left leg, 10/05/15 plt 271

## 2015-12-16 NOTE — Assessment & Plan Note (Signed)
Not apparent.  

## 2015-12-16 NOTE — Assessment & Plan Note (Signed)
09/18/13 Nifedipine 10mg  tid ac meals. 11/17/14 [facility] f/u as neeed Stable. Continue Omeprazole, Nifedipine.

## 2016-01-07 ENCOUNTER — Non-Acute Institutional Stay (SKILLED_NURSING_FACILITY): Payer: Medicare Other | Admitting: Internal Medicine

## 2016-01-07 ENCOUNTER — Encounter: Payer: Self-pay | Admitting: Internal Medicine

## 2016-01-07 DIAGNOSIS — D473 Essential (hemorrhagic) thrombocythemia: Secondary | ICD-10-CM | POA: Diagnosis not present

## 2016-01-07 DIAGNOSIS — I10 Essential (primary) hypertension: Secondary | ICD-10-CM

## 2016-01-07 DIAGNOSIS — J449 Chronic obstructive pulmonary disease, unspecified: Secondary | ICD-10-CM

## 2016-01-07 DIAGNOSIS — F0390 Unspecified dementia without behavioral disturbance: Secondary | ICD-10-CM | POA: Diagnosis not present

## 2016-01-07 DIAGNOSIS — E7800 Pure hypercholesterolemia, unspecified: Secondary | ICD-10-CM | POA: Diagnosis not present

## 2016-01-07 DIAGNOSIS — M5440 Lumbago with sciatica, unspecified side: Secondary | ICD-10-CM

## 2016-01-07 NOTE — Progress Notes (Signed)
Progress Note    Location:  [facility] Room Number: N35 Place of Service:  SNF [phone]) Provider:  Jeanmarie Hubert, MD  Patient Care Team: Sherri Dooms, MD as PCP - General (Internal Medicine) Sherri Dooms, MD as PCP - Internal Medicine (Internal Medicine) Sherri Belt, MD as Consulting Physician (Hematology and Oncology) Sherri Broad, MD as Consulting Physician (Physical Medicine and Rehabilitation) Sherri Space, MD (Pulmonary Disease) [facility] Otho Darner, NP as Nurse Practitioner (Nurse Practitioner) Sherri Castle, MD as Consulting Physician (Gastroenterology)  Extended Emergency Contact Information Primary Emergency Contact: Sherri Crawford,Sherri Crawford Address: [address]          [place], [place] [address] [place] Phone: [phone] Work Phone: [phone] Mobile Phone: [phone] Relation: Son Secondary Emergency Contact: Sherri Crawford, [place] Phone: [phone] Relation: Son  Code Status:  DNR Goals of care: Advanced Directive information Advanced Directives 01/07/2016  Does patient have an advance directive? Yes  Type of Paramedic of [place];Living will  Does patient want to make changes to advanced directive? -  Copy of advanced directive(s) in chart? Yes  Pre-existing out of facility DNR order (yellow form or pink MOST form) Yellow form placed in chart (order not valid for inpatient use)     Chief Complaint  Patient presents with  . Medical Management of Chronic Issues    routine visit    HPI:  Pt is a 82 y.o. female seen today for medical management of chronic diseases.     Past Medical History:  Diagnosis Date  . Abnormal weight gain 05/09/2012  . Achalasia 10/15/2014  . Anxiety   . Asthmatic bronchitis   . COPD (chronic obstructive pulmonary disease) ([place])   . Coronary artery disease   . Depression   .  Diverticulosis 05/10/2012  . DJD (degenerative joint disease)   . Esophageal stricture   . Essential thrombocythemia ([place])   . Essential thrombocythemia ([place]) 06/09/2009  . Essential thrombocythemia ([place]) 12/28/2014   1,000 mg on Mondays, 500 mg on Sundays, wednesdays, Fridays  Updated 12/28/14  . GERD (gastroesophageal reflux disease)   . History of colonic polyps   . History of transient ischemic attack (TIA)   . Hypercholesterolemia   . Hypertension   . Lumbar back pain   . Macular degeneration   . Memory loss 05/09/2012  . Osteoarthrosis, unspecified whether generalized or localized, unspecified site 05/09/2012  . Osteoporosis   . Phlebitis and thrombophlebitis of other deep vessels of lower extremities 07/10/2010   Past Surgical History:  Procedure Laterality Date  . ABDOMINAL HYSTERECTOMY    . APPENDECTOMY    . BOTOX INJECTION N/A 09/18/2013   Procedure: BOTOX INJECTION;  Surgeon: Lafayette Dragon, MD;  [facility];  Service: Endoscopy;  Laterality: N/A;  . BOTOX INJECTION N/A 10/16/2014   Procedure: BOTOX INJECTION;  Surgeon: Sherri Castle, MD;  [facility];  Service: Endoscopy;  Laterality: N/A;  . CATARACT EXTRACTION W/ INTRAOCULAR LENS  IMPLANT, BILATERAL    . CHOLECYSTECTOMY    . ESOPHAGEAL MANOMETRY N/A 08/25/2013   Procedure: ESOPHAGEAL MANOMETRY (EM);  Surgeon: Jerene Bears, MD;  [facility];  Service: Gastroenterology;  Laterality: N/A;  . ESOPHAGOGASTRODUODENOSCOPY N/A 07/26/2013   Procedure: ESOPHAGOGASTRODUODENOSCOPY (EGD);  Surgeon: Irene Shipper, MD;  Location: [facility] ENDOSCOPY;  Service: Endoscopy;  Laterality: N/A;  .  ESOPHAGOGASTRODUODENOSCOPY N/A 10/16/2014   Procedure: ESOPHAGOGASTRODUODENOSCOPY (EGD);  Surgeon: Sherri Castle, MD;  Location: [facility];  Service: Endoscopy;  Laterality: N/A;  with botox  . ESOPHAGOGASTRODUODENOSCOPY (EGD) WITH ESOPHAGEAL DILATION N/A 11/25/2012   Procedure: ESOPHAGOGASTRODUODENOSCOPY (EGD) WITH  ESOPHAGEAL DILATION;  Surgeon: Jerene Bears, MD;  [facility];  Service: Gastroenterology;  Laterality: N/A;  . ESOPHAGOGASTRODUODENOSCOPY (EGD) WITH PROPOFOL N/A 09/18/2013   Procedure: ESOPHAGOGASTRODUODENOSCOPY (EGD) WITH PROPOFOL;  Surgeon: Lafayette Dragon, MD;  [facility];  Service: Endoscopy;  Laterality: N/A;  . HIP ARTHROPLASTY Right 09/09/2012   Procedure: ARTHROPLASTY BIPOLAR HIP;  Surgeon: Mauri Pole, MD;  [facility];  Service: Orthopedics;  Laterality: Right;  . TAH and BSO  08/21/1995    Allergies  Allergen Reactions  . Sulfonamide Derivatives Other (See Comments)    Pt is unsure of reaction      Medication List       Accurate as of 01/07/16  1:48 PM. Always use your most recent med list.          acetaminophen 325 MG tablet Commonly known as:  TYLENOL Take 650 mg by mouth every 4 (four) hours as needed.   albuterol 108 (90 Base) MCG/ACT inhaler Commonly known as:  PROVENTIL HFA;VENTOLIN HFA Inhale 2 puffs into the lungs every 6 (six) hours as needed for wheezing or shortness of breath.   atorvastatin 20 MG tablet Commonly known as:  LIPITOR Take 20 mg by mouth every evening.   BREO ELLIPTA 100-25 MCG/INH Aepb Generic drug:  fluticasone furoate-vilanterol Inhale 1 puff into the lungs. One puff inhale once a day, rinse mouth after each use.   CHLORHEXIDINE GLUCONATE (BULK) Soln 5 ml swish and spit for 30 seconds every day indefinitely   DENTAGEL 1.1 % Gel dental gel Generic drug:  sodium fluoride Place 1 application onto teeth. Apply thin ribbon on tooth brush,  brush teeth at bedtime   feeding supplement Liqd Take 90 mLs by mouth 2 (two) times daily between meals.   HYDROcodone-acetaminophen 10-325 MG tablet Commonly known as:  NORCO Take 1 tablet by mouth 4 (four) times daily.   hydroxyurea 500 MG capsule Commonly known as:  HYDREA Take 500-1,000 mg by mouth See admin instructions. Take 1000 mg on Monday and 500 mg on  Sunday, Wednesday and Friday   memantine 10 MG tablet Commonly known as:  NAMENDA Take 10 mg by mouth. Take one tablet twice daily for memory   metoprolol tartrate 25 MG tablet Commonly known as:  LOPRESSOR Take 25 mg by mouth 2 (two) times daily.   montelukast 10 MG tablet Commonly known as:  SINGULAIR Take 10 mg by mouth at bedtime.   NIFEdipine 10 MG capsule Commonly known as:  PROCARDIA Take 10 mg by mouth. Squeeze contents of one capsule uner tongue 15-30 minutes before meals 3 times a day   nitroGLYCERIN 0.4 MG SL tablet Commonly known as:  NITROSTAT Place 0.4 mg under the tongue every 5 (five) minutes as needed for chest pain.   omeprazole 20 MG capsule Commonly known as:  PRILOSEC Take 20 mg by mouth at bedtime.   PRESERVISION AREDS 2 Caps Take 1 capsule by mouth 2 (two) times daily.   Rivaroxaban 15 MG Tabs tablet Commonly known as:  XARELTO Take 15 mg by mouth daily.   sertraline 25 MG tablet Commonly known as:  ZOLOFT Take 25 mg by mouth daily.       Review of Systems  Constitutional: Negative  for chills, diaphoresis and fever.  HENT: Positive for hearing loss. Negative for congestion, ear discharge, ear pain, nosebleeds, sore throat and tinnitus.   Eyes: Negative for photophobia, pain, discharge and redness.  Respiratory: Positive for shortness of breath (Morning are worse, but she remains dyspneic throughout the day.). Negative for cough, wheezing and stridor.   Cardiovascular: Negative for chest pain, palpitations and leg swelling.       Hx of Left DVT  Gastrointestinal: Negative for abdominal pain, blood in stool, constipation, diarrhea, nausea and vomiting.  Endocrine: Negative for polydipsia.  Genitourinary: Positive for frequency. Negative for dysuria, flank pain, hematuria and urgency.  Musculoskeletal: Positive for back pain and gait problem. Negative for myalgias and neck pain.       Lower extremity weakness. [place] wheelchair.  Skin: Negative  for rash.  Allergic/Immunologic: Negative for environmental allergies.  Neurological: Negative for dizziness, tremors, seizures, weakness and headaches.  Hematological: Negative.  Does not bruise/bleed easily.  Psychiatric/Behavioral: Positive for confusion. Negative for hallucinations and suicidal ideas. The patient is nervous/anxious.     Immunization History  Administered Date(s) Administered  . Influenza Split 11/16/2010, 12/13/2011  . Influenza Whole 11/27/2009  . Influenza-Unspecified 12/31/2013, 11/18/2014, 12/14/2015  . PPD Test 09/11/2012  . Pneumococcal Polysaccharide-23 02/28/1988   Pertinent  Health Maintenance Due  Topic Date Due  . PNA vac Low Risk Adult (2 of 2 - PCV13) 02/27/1989  . INFLUENZA VACCINE  Completed  . DEXA SCAN  Completed   Fall Risk  10/04/2015 12/28/2014 10/23/2014 11/24/2013  Falls in the past year? No Yes Yes Yes  Number falls in past yr: - 1 1 2  or more  Injury with Fall? - No No -  Risk for fall due to : Impaired mobility;Impaired balance/gait History of fall(s);Impaired balance/gait;Impaired mobility Impaired mobility History of fall(s);Impaired balance/gait;Impaired vision;Impaired mobility  Follow up - Falls prevention discussed Falls evaluation completed -     Vitals:   01/07/16 1327  BP: 126/62  Pulse: 70  Resp: 18  Temp: 98.6 F (37 C)  Weight: 109 lb 11.2 oz (49.8 kg)  Height: 5\' 1"  (1.549 m)   Body mass index is 20.73 kg/m. Physical Exam  Constitutional: She is oriented to person, place, and time. She appears well-developed and well-nourished. No distress.  HENT:  Head: Normocephalic and atraumatic.  Eyes: Conjunctivae and EOM are normal. Pupils are equal, round, and reactive to light.  Neck: Normal range of motion. Neck supple. No JVD present. No thyromegaly present.  Cardiovascular: Normal rate, regular rhythm and normal heart sounds.   Pulmonary/Chest: Effort normal. She has decreased breath sounds. She has no wheezes. She has  no rales.  Decreased breath sounds Bilat. Lungs.   Abdominal: Soft. Bowel sounds are normal. There is no tenderness.  Musculoskeletal: Normal range of motion. She exhibits edema. She exhibits no tenderness.  Lower back-baseline: pain is well controlled with prn inj, narcotics, w/c   Lymphadenopathy:    She has no cervical adenopathy.  Neurological: She is alert and oriented to person, place, and time. She has normal reflexes. No cranial nerve deficit. She exhibits normal muscle tone. Coordination normal.  8/ 26/16 BIMS score 14/15  Skin: Skin is warm and dry. No rash noted. She is not diaphoretic. No erythema (LLE).  The right hip surgical scar  Psychiatric: She has a normal mood and affect. Her affect is not angry, not blunt, not labile and not inappropriate. Her speech is not delayed, not tangential and not slurred. She is hyperactive  and slowed. She is not agitated, not aggressive, not withdrawn and not combative. Thought content is not paranoid and not delusional. Cognition and memory are impaired. She does not express impulsivity or inappropriate judgment. She exhibits abnormal recent memory.    Labs reviewed:  Recent Labs  05/04/15  NA 140  K 4.5  BUN 22*  CREATININE 0.9    Recent Labs  05/04/15  AST 14  ALT 10  ALKPHOS 56    Recent Labs  04/20/15 10/05/15 12/02/15  WBC 8.3 7.3 6.1  HGB 14.0 11.8* 12.0  HCT 42 36 37  PLT 325 271 271   Lab Results  Component Value Date   TSH 2.89 05/04/2015   Lab Results  Component Value Date   HGBA1C 5.5 10/16/2014   Lab Results  Component Value Date   CHOL 169 09/13/2011   HDL 62.80 09/13/2011   LDLCALC 79 09/13/2011   LDLDIRECT 152.3 06/04/2009   TRIG 137.0 09/13/2011   CHOLHDL 3 09/13/2011   Assessment/Plan 1. Dementia without behavioral disturbance, unspecified dementia type unchanged  2. Essential hypertension, benign controlled  3. COPD with asthma ([place]) unchanged  4. Acute right-sided low back pain with  sciatica, sciatica laterality unspecified Chronic and controlled  5. Essential thrombocythemia ([place]) controlled  6. HYPERCHOLESTEROLEMIA controlled

## 2016-01-21 DIAGNOSIS — D649 Anemia, unspecified: Secondary | ICD-10-CM | POA: Diagnosis not present

## 2016-01-21 DIAGNOSIS — I2510 Atherosclerotic heart disease of native coronary artery without angina pectoris: Secondary | ICD-10-CM | POA: Diagnosis not present

## 2016-01-21 LAB — CBC AND DIFFERENTIAL
HEMATOCRIT: 37 % (ref 36–46)
HEMOGLOBIN: 12.4 g/dL (ref 12.0–16.0)
Platelets: 331 10*3/uL (ref 150–399)
WBC: 9.5 10*3/mL

## 2016-01-21 LAB — BASIC METABOLIC PANEL
BUN: 25 mg/dL — AB (ref 4–21)
CREATININE: 0.9 mg/dL (ref <=1.1)
Glucose: 109 mg/dL
POTASSIUM: 4.5 mmol/L (ref 3.4–5.3)
Sodium: 138 mmol/L (ref 137–147)

## 2016-01-22 DIAGNOSIS — Z7901 Long term (current) use of anticoagulants: Secondary | ICD-10-CM | POA: Diagnosis not present

## 2016-01-22 LAB — POCT INR: INR: 1.1 (ref <=1.1)

## 2016-01-22 LAB — PROTIME-INR: PROTIME: 11.5 s (ref 10.0–13.8)

## 2016-01-24 ENCOUNTER — Encounter: Payer: Self-pay | Admitting: Nurse Practitioner

## 2016-01-24 ENCOUNTER — Other Ambulatory Visit: Payer: Self-pay | Admitting: *Deleted

## 2016-01-24 DIAGNOSIS — R040 Epistaxis: Secondary | ICD-10-CM

## 2016-01-24 HISTORY — DX: Epistaxis: R04.0

## 2016-01-26 ENCOUNTER — Non-Acute Institutional Stay (SKILLED_NURSING_FACILITY): Payer: Medicare Other | Admitting: Nurse Practitioner

## 2016-01-26 ENCOUNTER — Encounter: Payer: Self-pay | Admitting: Nurse Practitioner

## 2016-01-26 DIAGNOSIS — F418 Other specified anxiety disorders: Secondary | ICD-10-CM | POA: Diagnosis not present

## 2016-01-26 DIAGNOSIS — D473 Essential (hemorrhagic) thrombocythemia: Secondary | ICD-10-CM | POA: Diagnosis not present

## 2016-01-26 DIAGNOSIS — I82402 Acute embolism and thrombosis of unspecified deep veins of left lower extremity: Secondary | ICD-10-CM

## 2016-01-26 DIAGNOSIS — F32A Depression, unspecified: Secondary | ICD-10-CM

## 2016-01-26 DIAGNOSIS — K220 Achalasia of cardia: Secondary | ICD-10-CM | POA: Diagnosis not present

## 2016-01-26 DIAGNOSIS — M8949 Other hypertrophic osteoarthropathy, multiple sites: Secondary | ICD-10-CM

## 2016-01-26 DIAGNOSIS — I10 Essential (primary) hypertension: Secondary | ICD-10-CM

## 2016-01-26 DIAGNOSIS — M150 Primary generalized (osteo)arthritis: Secondary | ICD-10-CM | POA: Diagnosis not present

## 2016-01-26 DIAGNOSIS — F419 Anxiety disorder, unspecified: Secondary | ICD-10-CM

## 2016-01-26 DIAGNOSIS — J449 Chronic obstructive pulmonary disease, unspecified: Secondary | ICD-10-CM

## 2016-01-26 DIAGNOSIS — R040 Epistaxis: Secondary | ICD-10-CM | POA: Diagnosis not present

## 2016-01-26 DIAGNOSIS — M159 Polyosteoarthritis, unspecified: Secondary | ICD-10-CM

## 2016-01-26 DIAGNOSIS — F0150 Vascular dementia without behavioral disturbance: Secondary | ICD-10-CM | POA: Diagnosis not present

## 2016-01-26 DIAGNOSIS — F329 Major depressive disorder, single episode, unspecified: Secondary | ICD-10-CM

## 2016-01-26 NOTE — Assessment & Plan Note (Signed)
01/21/16: x2, held Xarelto x 1 day, Saline Nasal spray prn, wbc 9.5, Hgb 12.4, Plt 331, Na 138, K 4.5, Bun 25, creat 0.89 01/22/16 [id number])

## 2016-01-26 NOTE — Assessment & Plan Note (Signed)
F/u oncology, continue hydroxyurea 1000mg  q Monday, 500mg  Sun, Wed, Fri. Last Plt 331 01/21/16

## 2016-01-26 NOTE — Progress Notes (Signed)
Location:  [facility] Number: N35 Place of Service:  SNF (31) Provider:  Cyerra Yim, Manxie  NP  Jeanmarie Hubert, MD  Patient Care Team: Estill Dooms, MD as PCP - General (Internal Medicine) Estill Dooms, MD as PCP - Internal Medicine (Internal Medicine) Annia Belt, MD as Consulting Physician (Hematology and Oncology) Suella Broad, MD as Consulting Physician (Physical Medicine and Rehabilitation) Noralee Space, MD (Pulmonary Disease) [facility] Chelsey Kimberley Otho Darner, NP as Nurse Practitioner (Nurse Practitioner) Inda Castle, MD as Consulting Physician (Gastroenterology)  Extended Emergency Contact Information Primary Emergency Contact: Parlin,Gene Address: [address]          [place], [place] [address] [place] Phone: [phone] Work Phone: [phone] Mobile Phone: [phone] Relation: Son Secondary Emergency Contact: [name], [place] Phone: [phone] Relation: Son  Code Status:  DNR Goals of care: Advanced Directive information Advanced Directives 01/26/2016  Does Patient Have a Medical Advance Directive? Yes  Type of Paramedic of [place];Living will  Does patient want to make changes to medical advance directive? No - Patient declined  Copy of [place] in Chart? Yes  Pre-existing out of facility DNR order (yellow form or pink MOST form) -     Chief Complaint  Patient presents with  . Acute Visit    Epistaxis x 2    HPI:  Pt is a 82 y.o. female seen today for an acute visit for 01/21/16: x2, held Xarelto x 1 day, Saline Nasal spray prn, wbc 9.5, Hgb 12.4, Plt 331, Na 138, K 4.5, Bun 25, creat 0.89. 01/22/16 [id number])    Hx of dementia, has been stable on Namenda in SNF. GERD has been stable, hx of dilatations for achalasia, treated for candidiasis, f/u GI prn. Blood pressure has been  controlled on Metoprolol. Hx of LLE DVT related to elevated Plt , on Eliquis chronically. Last exacerbation of COPD was 07/2014, otherwise she is stable with occasional chronic hacking cough.   Past Medical History:  Diagnosis Date  . Abnormal weight gain 05/09/2012  . Achalasia 10/15/2014  . Anxiety   . Asthmatic bronchitis   . COPD (chronic obstructive pulmonary disease) ([place])   . Coronary artery disease   . Depression   . Diverticulosis 05/10/2012  . DJD (degenerative joint disease)   . Esophageal stricture   . Essential thrombocythemia ([place])   . Essential thrombocythemia ([place]) 06/09/2009  . Essential thrombocythemia ([place]) 12/28/2014   1,000 mg on Mondays, 500 mg on Sundays, wednesdays, Fridays  Updated 12/28/14  . GERD (gastroesophageal reflux disease)   . History of colonic polyps   . History of transient ischemic attack (TIA)   . Hypercholesterolemia   . Hypertension   . Lumbar back pain   . Macular degeneration   . Memory loss 05/09/2012  . Osteoarthrosis, unspecified whether generalized or localized, unspecified site 05/09/2012  . Osteoporosis   . Phlebitis and thrombophlebitis of other deep vessels of lower extremities 07/10/2010   Past Surgical History:  Procedure Laterality Date  . ABDOMINAL HYSTERECTOMY    . APPENDECTOMY    . BOTOX INJECTION N/A 09/18/2013   Procedure: BOTOX INJECTION;  Surgeon: Lafayette Dragon, MD;  Location: [facility];  Service: Endoscopy;  Laterality: N/A;  . BOTOX INJECTION N/A 10/16/2014   Procedure: BOTOX INJECTION;  Surgeon: Inda Castle, MD;  Location:  [facility];  Service: Endoscopy;  Laterality: N/A;  . CATARACT EXTRACTION W/ INTRAOCULAR LENS  IMPLANT, BILATERAL    . CHOLECYSTECTOMY    . ESOPHAGEAL MANOMETRY N/A 08/25/2013   Procedure: ESOPHAGEAL MANOMETRY (EM);  Surgeon: Jerene Bears, MD;  Location: [facility];  Service: Gastroenterology;  Laterality: N/A;  . ESOPHAGOGASTRODUODENOSCOPY N/A 07/26/2013   Procedure:  ESOPHAGOGASTRODUODENOSCOPY (EGD);  Surgeon: Irene Shipper, MD;  Location: [facility] ENDOSCOPY;  Service: Endoscopy;  Laterality: N/A;  . ESOPHAGOGASTRODUODENOSCOPY N/A 10/16/2014   Procedure: ESOPHAGOGASTRODUODENOSCOPY (EGD);  Surgeon: Inda Castle, MD;  Location: [facility];  Service: Endoscopy;  Laterality: N/A;  with botox  . ESOPHAGOGASTRODUODENOSCOPY (EGD) WITH ESOPHAGEAL DILATION N/A 11/25/2012   Procedure: ESOPHAGOGASTRODUODENOSCOPY (EGD) WITH ESOPHAGEAL DILATION;  Surgeon: Jerene Bears, MD;  Location: [facility];  Service: Gastroenterology;  Laterality: N/A;  . ESOPHAGOGASTRODUODENOSCOPY (EGD) WITH PROPOFOL N/A 09/18/2013   Procedure: ESOPHAGOGASTRODUODENOSCOPY (EGD) WITH PROPOFOL;  Surgeon: Lafayette Dragon, MD;  Location: [facility];  Service: Endoscopy;  Laterality: N/A;  . HIP ARTHROPLASTY Right 09/09/2012   Procedure: ARTHROPLASTY BIPOLAR HIP;  Surgeon: Mauri Pole, MD;  Location: [facility];  Service: Orthopedics;  Laterality: Right;  . TAH and BSO  08/21/1995    Allergies  Allergen Reactions  . Sulfonamide Derivatives Other (See Comments)    Pt is unsure of reaction      Medication List       Accurate as of 01/26/16  3:11 PM. Always use your most recent med list.          acetaminophen 325 MG tablet Commonly known as:  TYLENOL Take 650 mg by mouth every 4 (four) hours as needed.   albuterol 108 (90 Base) MCG/ACT inhaler Commonly known as:  PROVENTIL HFA;VENTOLIN HFA Inhale 2 puffs into the lungs every 6 (six) hours as needed for wheezing or shortness of breath.   atorvastatin 20 MG tablet Commonly known as:  LIPITOR Take 20 mg by mouth every evening.   BREO ELLIPTA 100-25 MCG/INH Aepb Generic drug:  fluticasone furoate-vilanterol Inhale 1 puff into the lungs. One puff inhale once a day, rinse mouth after each use.   CHLORHEXIDINE GLUCONATE (BULK) Soln 5 ml swish and spit for 30 seconds every day indefinitely   DENTAGEL 1.1 % Gel dental gel Generic drug:  sodium  fluoride Place 1 application onto teeth. Apply thin ribbon on tooth brush,  brush teeth at bedtime   feeding supplement Liqd Take 90 mLs by mouth 2 (two) times daily between meals.   HYDROcodone-acetaminophen 10-325 MG tablet Commonly known as:  NORCO Take 1 tablet by mouth 4 (four) times daily.   hydroxyurea 500 MG capsule Commonly known as:  HYDREA Take 500-1,000 mg by mouth See admin instructions. Take 1000 mg on Monday and 500 mg on Sunday, Wednesday and Friday   memantine 10 MG tablet Commonly known as:  NAMENDA Take 10 mg by mouth. Take one tablet twice daily for memory   metoprolol tartrate 25 MG tablet Commonly known as:  LOPRESSOR Take 25 mg by mouth 2 (two) times daily.   montelukast 10 MG tablet Commonly known as:  SINGULAIR Take 10 mg by mouth at bedtime.   NIFEdipine 10 MG capsule Commonly known as:  PROCARDIA Take 10 mg by mouth. Squeeze contents of one capsule uner tongue 15-30 minutes before meals 3 times a day   nitroGLYCERIN 0.4 MG SL tablet Commonly known as:  NITROSTAT Place 0.4 mg under the tongue every 5 (five) minutes as needed for chest  pain.   omeprazole 20 MG capsule Commonly known as:  PRILOSEC Take 20 mg by mouth at bedtime.   PRESERVISION AREDS 2 Caps Take 1 capsule by mouth 2 (two) times daily.   Rivaroxaban 15 MG Tabs tablet Commonly known as:  XARELTO Take 15 mg by mouth daily.   sertraline 25 MG tablet Commonly known as:  ZOLOFT Take 25 mg by mouth daily.       Review of Systems  Constitutional: Negative for chills, diaphoresis and fever.  HENT: Positive for hearing loss. Negative for congestion, ear discharge, ear pain, nosebleeds, sore throat and tinnitus.   Eyes: Negative for photophobia, pain, discharge and redness.  Respiratory: Positive for shortness of breath (Morning are worse, but she remains dyspneic throughout the day.). Negative for cough, wheezing and stridor.   Cardiovascular: Negative for chest pain, palpitations  and leg swelling.       Hx of Left DVT  Gastrointestinal: Negative for abdominal pain, blood in stool, constipation, diarrhea, nausea and vomiting.  Endocrine: Negative for polydipsia.  Genitourinary: Positive for frequency. Negative for dysuria, flank pain, hematuria and urgency.  Musculoskeletal: Positive for back pain and gait problem. Negative for myalgias and neck pain.       Lower extremity weakness. Crestline wheelchair.  Skin: Negative for rash.  Allergic/Immunologic: Negative for environmental allergies.  Neurological: Negative for dizziness, tremors, seizures, weakness and headaches.  Hematological: Negative.  Does not bruise/bleed easily.  Psychiatric/Behavioral: Positive for confusion. Negative for hallucinations and suicidal ideas. The patient is nervous/anxious.     Immunization History  Administered Date(s) Administered  . Influenza Split 11/16/2010, 12/13/2011  . Influenza Whole 11/27/2009  . Influenza-Unspecified 12/31/2013, 11/18/2014, 12/14/2015  . PPD Test 09/11/2012  . Pneumococcal Polysaccharide-23 02/28/1988   Pertinent  Health Maintenance Due  Topic Date Due  . PNA vac Low Risk Adult (2 of 2 - PCV13) 02/27/1989  . INFLUENZA VACCINE  Completed  . DEXA SCAN  Completed   Fall Risk  10/04/2015 12/28/2014 10/23/2014 11/24/2013  Falls in the past year? No Yes Yes Yes  Number falls in past yr: - 1 1 2  or more  Injury with Fall? - No No -  Risk for fall due to : Impaired mobility;Impaired balance/gait History of fall(s);Impaired balance/gait;Impaired mobility Impaired mobility History of fall(s);Impaired balance/gait;Impaired vision;Impaired mobility  Follow up - Falls prevention discussed Falls evaluation completed -   Functional Status Survey:    Vitals:   01/26/16 1249  BP: 112/60  Pulse: 78  Resp: 18  Temp: 98.6 F (37 C)  Weight: 109 lb 11.2 oz (49.8 kg)  Height: 5\' 1"  (1.549 m)   Body mass index is 20.73 kg/m. Physical Exam  Constitutional: She is  oriented to person, place, and time. She appears well-developed and well-nourished. No distress.  HENT:  Head: Normocephalic and atraumatic.  Eyes: Conjunctivae and EOM are normal. Pupils are equal, round, and reactive to light.  Neck: Normal range of motion. Neck supple. No JVD present. No thyromegaly present.  Cardiovascular: Normal rate, regular rhythm and normal heart sounds.   Pulmonary/Chest: Effort normal. She has decreased breath sounds. She has no wheezes. She has no rales.  Decreased breath sounds Bilat. Lungs.   Abdominal: Soft. Bowel sounds are normal. There is no tenderness.  Musculoskeletal: Normal range of motion. She exhibits edema. She exhibits no tenderness.  Lower back-baseline: pain is well controlled with prn inj, narcotics, w/c   Lymphadenopathy:    She has no cervical adenopathy.  Neurological: She is alert  and oriented to person, place, and time. She has normal reflexes. No cranial nerve deficit. She exhibits normal muscle tone. Coordination normal.  8/ 26/16 BIMS score 14/15  Skin: Skin is warm and dry. No rash noted. She is not diaphoretic. No erythema (LLE).  The right hip surgical scar  Psychiatric: She has a normal mood and affect. Her affect is not angry, not blunt, not labile and not inappropriate. Her speech is not delayed, not tangential and not slurred. She is hyperactive and slowed. She is not agitated, not aggressive, not withdrawn and not combative. Thought content is not paranoid and not delusional. Cognition and memory are impaired. She does not express impulsivity or inappropriate judgment. She exhibits abnormal recent memory.    Labs reviewed:  Recent Labs  05/04/15 01/21/16  NA 140 138  K 4.5 4.5  BUN 22* 25*  CREATININE 0.9 0.9    Recent Labs  05/04/15  AST 14  ALT 10  ALKPHOS 56    Recent Labs  10/05/15 12/02/15 01/21/16  WBC 7.3 6.1 9.5  HGB 11.8* 12.0 12.4  HCT 36 37 37  PLT 271 271 331   Lab Results  Component Value Date    TSH 2.89 05/04/2015   Lab Results  Component Value Date   HGBA1C 5.5 10/16/2014   Lab Results  Component Value Date   CHOL 169 09/13/2011   HDL 62.80 09/13/2011   LDLCALC 79 09/13/2011   LDLDIRECT 152.3 06/04/2009   TRIG 137.0 09/13/2011   CHOLHDL 3 09/13/2011    Significant Diagnostic Results in last 30 days:  No results found.  Assessment/Plan Epistaxis 01/21/16: x2, held Xarelto x 1 day, Saline Nasal spray prn, wbc 9.5, Hgb 12.4, Plt 331, Na 138, K 4.5, Bun 25, creat 0.89 01/22/16 [id number])   Essential hypertension, benign Controlled, continue Metoprolol 25mg  bid    Deep vein thrombosis of left lower extremity Switched to Xeralto since 09/12/12. Left leg   COPD with asthma ([place]) dc'd Epipen prn and Immunotherapy per [facility] 01/17/13 --maintenance, takes Montelukast 10mg  daily, Breo Ellipta I daily inhaled, Ventolin HFA 2 puffs q6h prn.  -last flare up 07/2014    Achalasia of esophagus 09/18/13 Nifedipine 10mg  tid ac meals. 11/17/14 [facility] f/u as neeed Stable. Continue Omeprazole, Nifedipine.    Dementia 11/25/14 MMSE 28/30, continue Namenda. 05/04/15 TSH 2.89    Osteoarthritis Long standing spondylosis, prn inj and Norco 10/325 qid for pain control, w/c for mobility except a few steps with walker sometimes.     Essential thrombocythemia [facility]) F/u oncology, continue hydroxyurea 1000mg  q Monday, 500mg  Sun, Wed, Fri. Last Plt 331 01/21/16   Anxiety and depression Her mood is stable, continue Sertraline 25mg  since 10/12/14([facility]DR), off Mirtazapine. Continue prn Lorazepam. 05/04/15 TSH 2.89      Family/ staff Communication: SNF  Labs/tests ordered:  none

## 2016-01-26 NOTE — Assessment & Plan Note (Signed)
Switched to Xeralto since 09/12/12. Left leg 

## 2016-01-26 NOTE — Assessment & Plan Note (Signed)
dc'd Epipen prn and Immunotherapy per  Allergy 01/17/13 --maintenance, takes Montelukast 10mg  daily, Breo Ellipta I daily inhaled, Ventolin HFA 2 puffs q6h prn.  -last flare up 07/2014

## 2016-01-26 NOTE — Assessment & Plan Note (Signed)
11/25/14 MMSE 28/30, continue Namenda. 05/04/15 TSH 2.89

## 2016-01-26 NOTE — Assessment & Plan Note (Signed)
Controlled, continue Metoprolol 25mg bid 

## 2016-01-26 NOTE — Assessment & Plan Note (Signed)
Her mood is stable, continue Sertraline 25mg  since 10/12/14(GDR), off Mirtazapine. Continue prn Lorazepam. 05/04/15 TSH 2.89

## 2016-01-26 NOTE — Assessment & Plan Note (Signed)
09/18/13 Nifedipine 10mg  tid ac meals. 11/17/14 [facility] f/u as neeed Stable. Continue Omeprazole, Nifedipine.

## 2016-01-26 NOTE — Assessment & Plan Note (Signed)
Long standing spondylosis, prn inj and Norco 10/325 qid for pain control, w/c for mobility except a few steps with walker sometimes 

## 2016-02-10 ENCOUNTER — Encounter (HOSPITAL_COMMUNITY): Payer: Self-pay | Admitting: Emergency Medicine

## 2016-02-10 ENCOUNTER — Non-Acute Institutional Stay (SKILLED_NURSING_FACILITY): Payer: Medicare Other | Admitting: Internal Medicine

## 2016-02-10 ENCOUNTER — Emergency Department (HOSPITAL_COMMUNITY)
Admission: EM | Admit: 2016-02-10 | Discharge: 2016-02-10 | Disposition: A | Discharge status: Home / Self Care | Payer: Medicare Other | Attending: Emergency Medicine | Admitting: Emergency Medicine

## 2016-02-10 ENCOUNTER — Encounter: Payer: Self-pay | Admitting: Internal Medicine

## 2016-02-10 DIAGNOSIS — I10 Essential (primary) hypertension: Secondary | ICD-10-CM | POA: Insufficient documentation

## 2016-02-10 DIAGNOSIS — I2510 Atherosclerotic heart disease of native coronary artery without angina pectoris: Secondary | ICD-10-CM | POA: Insufficient documentation

## 2016-02-10 DIAGNOSIS — R040 Epistaxis: Secondary | ICD-10-CM | POA: Insufficient documentation

## 2016-02-10 DIAGNOSIS — R030 Elevated blood-pressure reading, without diagnosis of hypertension: Secondary | ICD-10-CM | POA: Diagnosis not present

## 2016-02-10 DIAGNOSIS — Z79899 Other long term (current) drug therapy: Secondary | ICD-10-CM | POA: Diagnosis not present

## 2016-02-10 DIAGNOSIS — J449 Chronic obstructive pulmonary disease, unspecified: Secondary | ICD-10-CM | POA: Diagnosis not present

## 2016-02-10 DIAGNOSIS — Z96641 Presence of right artificial hip joint: Secondary | ICD-10-CM | POA: Diagnosis not present

## 2016-02-10 DIAGNOSIS — E782 Mixed hyperlipidemia: Secondary | ICD-10-CM | POA: Diagnosis not present

## 2016-02-10 LAB — I-STAT CHEM 8, ED
BUN: 24 mg/dL — ABNORMAL HIGH (ref 6–20)
CALCIUM ION: 1.25 mmol/L (ref 1.15–1.40)
CHLORIDE: 101 mmol/L (ref 101–111)
Creatinine, Ser: 0.9 mg/dL (ref 0.44–1.00)
Glucose, Bld: 113 mg/dL — ABNORMAL HIGH (ref 65–99)
HCT: 40 % (ref 36.0–46.0)
Hemoglobin: 13.6 g/dL (ref 12.0–15.0)
Potassium: 4.3 mmol/L (ref 3.5–5.1)
SODIUM: 139 mmol/L (ref 135–145)
TCO2: 30 mmol/L (ref 0–100)

## 2016-02-10 LAB — LIPID PANEL
CHOLESTEROL: 113 mg/dL (ref 0–200)
HDL: 42 mg/dL (ref 35–70)
LDL CALC: 43 mg/dL
LDL/HDL RATIO: 2.7
TRIGLYCERIDES: 138 mg/dL (ref 40–160)

## 2016-02-10 MED ORDER — HUMIDIFIER MISC: 0 refills | Status: AC

## 2016-02-10 MED ORDER — SALINE SPRAY 0.65 % NA SOLN: 2.0000 | Freq: Three times a day (TID) | NASAL | 0 refills | Status: DC

## 2016-02-10 MED ORDER — OXYMETAZOLINE HCL 0.05 % NA SOLN
2.0000 | Freq: Two times a day (BID) | NASAL | Status: DC
  Administered 2016-02-10: 2 via NASAL
  Filled 2016-02-10: qty 15

## 2016-02-10 MED ORDER — OXYMETAZOLINE HCL 0.05 % NA SOLN: 2.0000 | Freq: Two times a day (BID) | NASAL | 0 refills | Status: AC

## 2016-02-10 NOTE — Progress Notes (Signed)
Progress Note    Location:  [facility] Room Number: N35 Place of Service:  SNF [phone]) Provider:  Jeanmarie Hubert, MD  Patient Care Team: Estill Dooms, MD as PCP - General (Internal Medicine) Estill Dooms, MD as PCP - Internal Medicine (Internal Medicine) Annia Belt, MD as Consulting Physician (Hematology and Oncology) Suella Broad, MD as Consulting Physician (Physical Medicine and Rehabilitation) Noralee Space, MD (Pulmonary Disease) [facility] Otho Darner, NP as Nurse Practitioner (Nurse Practitioner) Inda Castle, MD as Consulting Physician (Gastroenterology)  Extended Emergency Contact Information Primary Emergency Contact: Leccese,Gene Address: [address]          [place], [place] [address] [place] Phone: [phone] Work Phone: [phone] Mobile Phone: [phone] Relation: Son Secondary Emergency Contact: [name], [place] Phone: [phone] Relation: Son  Code Status:  DNR Goals of care: Advanced Directive information Advanced Directives 01/26/2016  Does Patient Have a Medical Advance Directive? Yes  Type of Paramedic of [name];Living will  Does patient want to make changes to medical advance directive? No - Patient declined  Copy of [place] in Chart? Yes  Pre-existing out of facility DNR order (yellow form or pink MOST form) -     Chief Complaint  Patient presents with  . Acute Visit    continue to have nose bleeds, moderate amount, bright red blood.     HPI:  Pt is a 82 y.o. female seen today for an acute visit for anterior nosebleed right nasal septum for 4 hours. Has not responded to pressure and cool pack. No known trauma. Using Eliquis for hx of DVT and continuing Thrombocythemia.   Past Medical History:  Diagnosis Date  . Abnormal weight gain 05/09/2012  . Achalasia  of esophagus 09/18/2013   09/18/13 Nifedipine 10mg  tid ac meals. 11/17/14 [facility] f/u as neeed   . Anxiety and depression 03/12/2012   Increased Sertraline to 50mg  daily 07/16/13 08/26/13 Mirtazapine 7.5mg  qhs  10/12/14 Pharm decreased Sertraline to 25mg .  05/04/15 TSH 2.89 09/15/15 pharm taper off Sertraline.  10/08/15 continue Sertraline 25mg  daily, POA desires. 12/02/15 wbc 6.1, Hgb 12.0, plt 271    . Asthmatic bronchitis   . BACK PAIN, LUMBAR 03/12/2007   Hx of lower back pain, had X-ray of her lumbar spine 09/08/12--spondylosis-better controlled with Hydrocodone/ApAp  10/325mg  ac and hs  01/01/13 lumbar spine inj. Able to ambulate with walker on unit until her fall 06/22/13. 06/25/13 unremarkable. X-ray lumbar spine. 11/10/13 dc Oxycodone prn-not used in 60 days.     . COLONIC POLYPS 03/12/2007   Initially noted April 2002 colonoscopy. Followup colonoscopy 05/31/2006 did not disclose any further polyps.    Marland Kitchen COPD (chronic obstructive pulmonary disease) ([place])   . Coronary artery disease   . Deep vein thrombosis of left lower extremity ([place]) 07/19/2012  . Dementia 09/16/2012   05/04/15 TSH 2.89   . Depression   . Diverticulosis 05/10/2012  . DJD (degenerative joint disease)   . Dry skin dermatitis 11/18/2012  . Edema of leg 07/01/2010  . Epistaxis 01/24/2016   01/21/16: x2, held Xarelto x 1 day, Saline Nasal spray prn, wbc 9.5, Hgb 12.4, Plt 331, Na 138, K 4.5, Bun 25, creat 0.89 01/22/16 [id number])  . Esophageal dysmotility 07/26/2013  . Esophageal dysphagia 05/27/2010   New dx of achalasia 6/ 2015  09/18/13 Nifedipine 10mg  tid ac meals.    . Esophageal stricture   . Essential hypertension, benign 10/30/2013   12/02/15 wbc 6.1, Hgb 12.0, plt 271   . Essential thrombocythemia ([place]) 06/09/2009  . Fall 06/25/2013  . GERD (gastroesophageal reflux disease)   . Hip fracture ([place]) 09/08/2012  . HYPERCHOLESTEROLEMIA 03/12/2007   08/10/15 cholesterol 119, triglycerides 143, HDL 51, LDL 39   . Lumbar back pain   . MACULAR  DEGENERATION 01/26/2008   Qualifier: Diagnosis of  By: Lenna Gilford MD, Deborra Medina   . Osteoarthritis 03/12/2007  . Osteoporosis 03/12/2007   Qualifier: Diagnosis of  By: Julien Girt CMA, Marliss Czar  06/15/14 dc Vit D 1000u po daily per pharm.    . Phlebitis and thrombophlebitis of other deep vessels of lower extremities 07/10/2010  . S/P balloon dilatation of esophageal stricture 07/28/2013   07/26/13. Dr. Henrene Pastor. EGD foreign removal and Balloon dilation of esophagus. Esophageal dysmotility, suspected achalasia, dilation at the GE junction to 76mm. Candida esophagitis.    . SYNCOPE 07/30/2009   Occurred 2011. Hospitalized. Felt due to dehydration.    . TRANSIENT ISCHEMIC ATTACKS, HX OF 03/12/2007   Qualifier: Diagnosis of  By: Julien Girt CMA, Marliss Czar     Past Surgical History:  Procedure Laterality Date  . ABDOMINAL HYSTERECTOMY    . APPENDECTOMY    . BOTOX INJECTION N/A 09/18/2013   Procedure: BOTOX INJECTION;  Surgeon: Lafayette Dragon, MD;  Location: [facility];  Service: Endoscopy;  Laterality: N/A;  . BOTOX INJECTION N/A 10/16/2014   Procedure: BOTOX INJECTION;  Surgeon: Inda Castle, MD;  Location: [facility];  Service: Endoscopy;  Laterality: N/A;  . CATARACT EXTRACTION W/ INTRAOCULAR LENS  IMPLANT, BILATERAL    . CHOLECYSTECTOMY    . ESOPHAGEAL MANOMETRY N/A 08/25/2013   Procedure: ESOPHAGEAL MANOMETRY (EM);  Surgeon: Jerene Bears, MD;  Location: [facility];  Service: Gastroenterology;  Laterality: N/A;  . ESOPHAGOGASTRODUODENOSCOPY N/A 07/26/2013   Procedure: ESOPHAGOGASTRODUODENOSCOPY (EGD);  Surgeon: Irene Shipper, MD;  Location: [facility] END[facility]COPY;  Service: Endoscopy;  Laterality: N/A;  . ESOPHAGOGASTRODUODENOSCOPY N/A 10/16/2014   Procedure: ESOPHAGOGASTRODUODENOSCOPY (EGD);  Surgeon: Inda Castle, MD;  Location: [facility];  Service: Endoscopy;  Laterality: N/A;  with botox  . ESOPHAGOGASTRODUODENOSCOPY (EGD) WITH ESOPHAGEAL DILATION N/A 11/25/2012   Procedure: ESOPHAGOGASTRODUODENOSCOPY (EGD) WITH ESOPHAGEAL  DILATION;  Surgeon: Jerene Bears, MD;  Location: [facility];  Service: Gastroenterology;  Laterality: N/A;  . ESOPHAGOGASTRODUODENOSCOPY (EGD) WITH PROPOFOL N/A 09/18/2013   Procedure: ESOPHAGOGASTRODUODENOSCOPY (EGD) WITH PROPOFOL;  Surgeon: Lafayette Dragon, MD;  Location: [facility];  Service: Endoscopy;  Laterality: N/A;  . HIP ARTHROPLASTY Right 09/09/2012   Procedure: ARTHROPLASTY BIPOLAR HIP;  Surgeon: Mauri Pole, MD;  Location: [facility];  Service: Orthopedics;  Laterality: Right;  . TAH and BSO  08/21/1995    Allergies  Allergen Reactions  . Sulfonamide Derivatives Other (See Comments)    Pt is unsure of reaction      Medication List       Accurate as of 02/10/16 10:49 AM. Always use your most recent med list.          acetaminophen 325 MG tablet Commonly known as:  TYLENOL Take 650 mg by mouth every 4 (four) hours as needed.   albuterol 108 (90 Base) MCG/ACT inhaler Commonly known as:  PROVENTIL HFA;VENTOLIN HFA Inhale 2 puffs into the lungs every 6 (six) hours as needed for wheezing or shortness of breath.   atorvastatin 20 MG tablet Commonly known as:  LIPITOR Take 20 mg by mouth every evening.   BREO ELLIPTA 100-25 MCG/INH Aepb Generic drug:  fluticasone furoate-vilanterol Inhale 1 puff into the lungs. One puff inhale once a day, rinse mouth after each use.   CHLORHEXIDINE GLUCONATE (BULK) Soln 5 ml swish and spit for 30 seconds every day indefinitely   DENTAGEL 1.1 % Gel dental gel Generic drug:  sodium fluoride Place 1 application onto teeth. Apply thin ribbon on tooth brush,  brush teeth at bedtime   feeding supplement Liqd Take 90 mLs by mouth 2 (two) times daily between meals.   HYDROcodone-acetaminophen 10-325 MG tablet Commonly known as:  NORCO Take 1 tablet by mouth 4 (four) times daily.   hydroxyurea 500 MG capsule Commonly known as:  HYDREA Take 500-1,000 mg by mouth See admin instructions. Take 1000 mg on Monday and 500 mg on Sunday,  Wednesday and Friday   memantine 10 MG tablet Commonly known as:  NAMENDA Take 10 mg by mouth. Take one tablet twice daily for memory   metoprolol tartrate 25 MG tablet Commonly known as:  LOPRESSOR Take 25 mg by mouth 2 (two) times daily.   montelukast 10 MG tablet Commonly known as:  SINGULAIR Take 10 mg by mouth at bedtime.   NIFEdipine 10 MG capsule Commonly known as:  PROCARDIA Take 10 mg by mouth. Squeeze contents of one capsule uner tongue 15-30 minutes before meals 3 times a day   nitroGLYCERIN 0.4 MG SL tablet Commonly known as:  NITROSTAT Place 0.4 mg under the tongue every 5 (five) minutes as needed for chest pain.   omeprazole 20 MG capsule Commonly known as:  PRILOSEC Take 20 mg by mouth at bedtime.   PRESERVISION AREDS 2 Caps Take 1 capsule by mouth 2 (two) times daily.   Rivaroxaban 15 MG Tabs tablet Commonly known as:  XARELTO Take 15 mg by mouth daily.   sertraline 25 MG tablet Commonly known as:  ZOLOFT Take 25 mg by mouth daily.       Review of Systems  Constitutional: Negative for chills, diaphoresis and fever.  HENT: Positive for hearing loss and nosebleeds (right nasal septum). Negative for congestion, ear discharge, ear pain, sore throat and tinnitus.   Eyes: Negative for photophobia, pain, discharge and redness.  Respiratory: Positive for shortness of breath (Morning are worse, but she remains dyspneic throughout the day.). Negative for cough, wheezing and stridor.   Cardiovascular: Negative for chest pain, palpitations and leg swelling.       Hx of Left DVT  Gastrointestinal: Negative for abdominal pain, blood in stool, constipation, diarrhea, nausea and vomiting.  Endocrine: Negative for polydipsia.  Genitourinary: Positive for frequency. Negative for dysuria, flank pain, hematuria and urgency.  Musculoskeletal: Positive for back pain and gait problem. Negative for myalgias and neck pain.       Lower extremity weakness. [place] wheelchair.    Skin: Negative for rash.  Allergic/Immunologic: Negative for environmental allergies.  Neurological: Negative for dizziness, tremors, seizures, weakness and headaches.  Hematological: Negative.  Does not bruise/bleed easily.  Psychiatric/Behavioral: Positive for confusion. Negative for hallucinations and suicidal ideas. The patient is nervous/anxious.     Immunization History  Administered Date(s) Administered  . Influenza Split 11/16/2010, 12/13/2011  . Influenza Whole 11/27/2009  . Influenza-Unspecified 12/31/2013, 11/18/2014, 12/14/2015  . PPD Test 09/11/2012  . Pneumococcal Polysaccharide-23 02/28/1988   Pertinent  Health Maintenance Due  Topic Date Due  . PNA vac Low Risk Adult (2 of 2 - PCV13) 02/27/1989  .  INFLUENZA VACCINE  Completed  . DEXA SCAN  Completed   Fall Risk  10/04/2015 12/28/2014 10/23/2014 11/24/2013  Falls in the past year? No Yes Yes Yes  Number falls in past yr: - 1 1 2  or more  Injury with Fall? - No No -  Risk for fall due to : Impaired mobility;Impaired balance/gait History of fall(s);Impaired balance/gait;Impaired mobility Impaired mobility History of fall(s);Impaired balance/gait;Impaired vision;Impaired mobility  Follow up - Falls prevention discussed Falls evaluation completed -   Functional Status Survey:    Vitals:   02/10/16 1037  BP: 130/72  Pulse: 76  Resp: 20  Temp: 98.2 F (36.8 C)  Weight: 109 lb 11.2 oz (49.8 kg)  Height: 5' 1.2" (1.554 m)   Body mass index is 20.59 kg/m. Physical Exam  Constitutional: She is oriented to person, place, and time. She appears well-developed and well-nourished. No distress.  HENT:  Head: Normocephalic and atraumatic.  Right nasal septal bleeding at Kiesselbach's plexus. Arterioloar pumper.  Eyes: Conjunctivae and EOM are normal. Pupils are equal, round, and reactive to light.  Neck: Normal range of motion. Neck supple. No JVD present. No thyromegaly present.  Cardiovascular: Normal rate, regular  rhythm and normal heart sounds.   Pulmonary/Chest: Effort normal. She has decreased breath sounds. She has no wheezes. She has no rales.  Decreased breath sounds Bilat. Lungs.   Abdominal: Soft. Bowel sounds are normal. There is no tenderness.  Musculoskeletal: Normal range of motion. She exhibits edema. She exhibits no tenderness.  Lower back-baseline: pain is well controlled with prn inj, narcotics, w/c   Lymphadenopathy:    She has no cervical adenopathy.  Neurological: She is alert and oriented to person, place, and time. She has normal reflexes. No cranial nerve deficit. She exhibits normal muscle tone. Coordination normal.  8/ 26/16 BIMS score 14/15  Skin: Skin is warm and dry. No rash noted. She is not diaphoretic. No erythema (LLE).  The right hip surgical scar  Psychiatric: She has a normal mood and affect. Her affect is not angry, not blunt, not labile and not inappropriate. Her speech is not delayed, not tangential and not slurred. She is hyperactive and slowed. She is not agitated, not aggressive, not withdrawn and not combative. Thought content is not paranoid and not delusional. Cognition and memory are impaired. She does not express impulsivity or inappropriate judgment. She exhibits abnormal recent memory.    Labs reviewed:  Recent Labs  05/04/15 01/21/16  NA 140 138  K 4.5 4.5  BUN 22* 25*  CREATININE 0.9 0.9    Recent Labs  05/04/15  AST 14  ALT 10  ALKPHOS 56    Recent Labs  10/05/15 12/02/15 01/21/16  WBC 7.3 6.1 9.5  HGB 11.8* 12.0 12.4  HCT 36 37 37  PLT 271 271 331   Lab Results  Component Value Date   TSH 2.89 05/04/2015   Lab Results  Component Value Date   HGBA1C 5.5 10/16/2014   Lab Results  Component Value Date   CHOL 169 09/13/2011   HDL 62.80 09/13/2011   LDLCALC 79 09/13/2011   LDLDIRECT 152.3 06/04/2009   TRIG 137.0 09/13/2011   CHOLHDL 3 09/13/2011    Assessment/Plan 1. Epistaxis Attempted to pack with cotton wad and triple  antibiotic ointment, but bleeding persisted. Attempted cautery with silver nitrate, but the arteriolar pumper was too strong to allow satisfactory cautery.  Sent to ER.

## 2016-02-10 NOTE — Progress Notes (Signed)
Patient is from [facility]. CSW following for disposition and return to facility once medically cleared for discharge.          Lorrine Kin, MSW, LCSW [facility] ED/82M Clinical Social Worker [phone]

## 2016-02-10 NOTE — ED Triage Notes (Signed)
[facility] ED [facility]. Pt has had a nose bleed for 2 days intermittently. Not bleeding at present, but states is bleeding down back of throat. Was seen by dr. Nyoka Cowden and sent here for further eval.

## 2016-02-10 NOTE — Discharge Instructions (Signed)
Thank you for visiting ED today. Please use Afrin nasal spray twice daily for 3 days as directed. Use saline nasal spray 3-4 times daily. Please do not blow your nose that can dislodge the clot and started bleeding. Use a humidifier to prevent excessive dryness. Your doctor can remove your nasal packing in 3 days. Please make an appointment with ENT for cauterization.

## 2016-02-10 NOTE — ED Provider Notes (Signed)
Emergency Department Provider Note   I have reviewed the triage vital signs and the nursing notes.   HISTORY  Chief Complaint Epistaxis   HPI Sherri Crawford is a 82 y.o. female with past medical history significant for COPD, CAD, DVT of lower extremity on Xarelto, dementia and esophageal achalasia came to ED from skilled nursing facility for evaluation of frequent epistaxis. She is having intermittent frequent nasal bleed for 1 month, at the end of November her Xarelto was stopped for one day. She had 2 episodes of nasal bleed yesterday, and then 1 this morning, which were more prolonged and they were unable to stop it even with the help of some silver nitrate, so they brought her to ED for further evaluation and management.  Her bleeding has been stopped before arrival to ED.  She denies any headache, dizziness, visual change, sinus congestion or postnasal drip.  She denies any chest pain, shortness of breath, hematuria, melena or hematochezia.  She is a poor historian and accompanied by her son.   Past Medical History:  Diagnosis Date  . Abnormal weight gain 05/09/2012  . Achalasia of esophagus 09/18/2013   09/18/13 Nifedipine 10mg  tid ac meals. 11/17/14 [facility] f/u as neeed   . Anxiety and depression 03/12/2012   Increased Sertraline to 50mg  daily 07/16/13 08/26/13 Mirtazapine 7.5mg  qhs  10/12/14 Pharm decreased Sertraline to 25mg .  05/04/15 TSH 2.89 09/15/15 pharm taper off Sertraline.  10/08/15 continue Sertraline 25mg  daily, POA desires. 12/02/15 wbc 6.1, Hgb 12.0, plt 271    . Asthmatic bronchitis   . BACK PAIN, LUMBAR 03/12/2007   Hx of lower back pain, had X-ray of her lumbar spine 09/08/12--spondylosis-better controlled with Hydrocodone/ApAp  10/325mg  ac and hs  01/01/13 lumbar spine inj. Able to ambulate with walker on unit until her fall 06/22/13. 06/25/13 unremarkable. X-ray lumbar spine. 11/10/13 dc Oxycodone prn-not used in 60 days.     . COLONIC POLYPS 03/12/2007   Initially  noted April 2002 colonoscopy. Followup colonoscopy 05/31/2006 did not disclose any further polyps.    Marland Kitchen COPD (chronic obstructive pulmonary disease) ([place])   . Coronary artery disease   . Deep vein thrombosis of left lower extremity ([place]) 07/19/2012  . Dementia 09/16/2012   05/04/15 TSH 2.89   . Depression   . Diverticulosis 05/10/2012  . DJD (degenerative joint disease)   . Dry skin dermatitis 11/18/2012  . Edema of leg 07/01/2010  . Epistaxis 01/24/2016   01/21/16: x2, held Xarelto x 1 day, Saline Nasal spray prn, wbc 9.5, Hgb 12.4, Plt 331, Na 138, K 4.5, Bun 25, creat 0.89 01/22/16 [id number])  . Esophageal dysmotility 07/26/2013  . Esophageal dysphagia 05/27/2010   New dx of achalasia 6/ 2015 09/18/13 Nifedipine 10mg  tid ac meals.    . Esophageal stricture   . Essential hypertension, benign 10/30/2013   12/02/15 wbc 6.1, Hgb 12.0, plt 271   . Essential thrombocythemia ([place]) 06/09/2009  . Fall 06/25/2013  . GERD (gastroesophageal reflux disease)   . Hip fracture ([place]) 09/08/2012  . HYPERCHOLESTEROLEMIA 03/12/2007   08/10/15 cholesterol 119, triglycerides 143, HDL 51, LDL 39   . Lumbar back pain   . MACULAR DEGENERATION 01/26/2008   Qualifier: Diagnosis of  By: Lenna Gilford MD, Deborra Medina   . Osteoarthritis 03/12/2007  . Osteoporosis 03/12/2007   Qualifier: Diagnosis of  By: Julien Girt CMA, Marliss Czar  06/15/14 dc Vit D 1000u po daily per pharm.    . Phlebitis and thrombophlebitis of other deep vessels of lower extremities  07/10/2010  . S/P balloon dilatation of esophageal stricture 07/28/2013   07/26/13. Dr. Henrene Pastor. EGD foreign removal and Balloon dilation of esophagus. Esophageal dysmotility, suspected achalasia, dilation at the GE junction to 48mm. Candida esophagitis.    . SYNCOPE 07/30/2009   Occurred 2011. Hospitalized. Felt due to dehydration.    . TRANSIENT ISCHEMIC ATTACKS, HX OF 03/12/2007   Qualifier: Diagnosis of  By: Julien Girt CMA, Leigh      Patient Active Problem List   Diagnosis Date Noted  . Epistaxis  01/24/2016  . Essential thrombocythemia ([place]) 12/28/2014  . Essential hypertension, benign 10/30/2013  . Achalasia of esophagus 09/18/2013  . S/P balloon dilatation of esophageal stricture 07/28/2013  . Esophageal dysmotility 07/26/2013  . Foreign body in esophagus 07/26/2013  . Fall 06/25/2013  . Dry skin dermatitis 11/18/2012  . Dementia 09/16/2012  . Hip fracture ([place]) 09/08/2012  . Deep vein thrombosis of left lower extremity ([place]) 07/19/2012  . Anxiety and depression 03/12/2012  . Edema of leg 07/01/2010  . Esophageal dysphagia 05/27/2010  . SYNCOPE 07/30/2009  . MACULAR DEGENERATION 01/26/2008  . Coronary atherosclerosis 03/31/2007  . COLONIC POLYPS 03/12/2007  . HYPERCHOLESTEROLEMIA 03/12/2007  . COPD with asthma ([place]) 03/12/2007  . GERD 03/12/2007  . Osteoarthritis 03/12/2007  . BACK PAIN, LUMBAR 03/12/2007  . Osteoporosis 03/12/2007  . TRANSIENT ISCHEMIC ATTACKS, HX OF 03/12/2007    Past Surgical History:  Procedure Laterality Date  . ABDOMINAL HYSTERECTOMY    . APPENDECTOMY    . BOTOX INJECTION N/A 09/18/2013   Procedure: BOTOX INJECTION;  Surgeon: Lafayette Dragon, MD;  Location: [facility];  Service: Endoscopy;  Laterality: N/A;  . BOTOX INJECTION N/A 10/16/2014   Procedure: BOTOX INJECTION;  Surgeon: Inda Castle, MD;  Location: [facility];  Service: Endoscopy;  Laterality: N/A;  . CATARACT EXTRACTION W/ INTRAOCULAR LENS  IMPLANT, BILATERAL    . CHOLECYSTECTOMY    . ESOPHAGEAL MANOMETRY N/A 08/25/2013   Procedure: ESOPHAGEAL MANOMETRY (EM);  Surgeon: Jerene Bears, MD;  Location: [facility];  Service: Gastroenterology;  Laterality: N/A;  . ESOPHAGOGASTRODUODENOSCOPY N/A 07/26/2013   Procedure: ESOPHAGOGASTRODUODENOSCOPY (EGD);  Surgeon: Irene Shipper, MD;  Location: [facility] ENDOSCOPY;  Service: Endoscopy;  Laterality: N/A;  . ESOPHAGOGASTRODUODENOSCOPY N/A 10/16/2014   Procedure: ESOPHAGOGASTRODUODENOSCOPY (EGD);  Surgeon: Inda Castle, MD;  Location: [facility];  Service: Endoscopy;  Laterality: N/A;  with botox  . ESOPHAGOGASTRODUODENOSCOPY (EGD) WITH ESOPHAGEAL DILATION N/A 11/25/2012   Procedure: ESOPHAGOGASTRODUODENOSCOPY (EGD) WITH ESOPHAGEAL DILATION;  Surgeon: Jerene Bears, MD;  Location: [facility];  Service: Gastroenterology;  Laterality: N/A;  . ESOPHAGOGASTRODUODENOSCOPY (EGD) WITH PROPOFOL N/A 09/18/2013   Procedure: ESOPHAGOGASTRODUODENOSCOPY (EGD) WITH PROPOFOL;  Surgeon: Lafayette Dragon, MD;  Location: [facility];  Service: Endoscopy;  Laterality: N/A;  . HIP ARTHROPLASTY Right 09/09/2012   Procedure: ARTHROPLASTY BIPOLAR HIP;  Surgeon: Mauri Pole, MD;  Location: [facility];  Service: Orthopedics;  Laterality: Right;  . TAH and BSO  08/21/1995    Current Outpatient Rx  . Order #: [id number] Class: Historical Med  . Order #: [id number] Class: Historical Med  . Order #: [id number] Class: Historical Med  . Order #: [id number] Class: Historical Med  . Order #: [id number] Class: Historical Med  . Order #: [id number] Class: Historical Med  . Order #: [id number] Class: Historical Med  . Order #: [id number] Class: Historical Med  . Order #: [id number] Class: Historical Med  . Order #: [id number] Class: Historical Med  . Order #: [id number] Class: Historical Med  . Order #: [id number] Class: Historical Med  .  Order #: [id number] Class: Historical Med  . Order #: [id number] Class: Historical Med  . Order #: [id number] Class: Historical Med  . Order #: [id number] Class: Historical Med  . Order #: [id number] Class: Historical Med  . Order #: [id number] Class: Historical Med  . Order #: [id number] Class: Historical Med  . Order #: [id number] Class: Historical Med  . Order #: [id number] Class: Print  . Order #: [id number] Class: Print  . Order #: [id number] Class: Print    Allergies Sulfamethoxazole  Family History  Problem Relation Age of Onset  . Colon cancer Son 43  . Crohn's disease Son   . Cancer Maternal Aunt     Social History Social History  Substance Use  Topics  . Smoking status: Never Smoker  . Smokeless tobacco: Never Used  . Alcohol use No    Review of Systems Constitutional: No fever/chills Eyes: No visual changes. ENT: No sore throat.Nasal bleed  Cardiovascular: Denies chest pain. Respiratory: Denies shortness of breath. Gastrointestinal: No abdominal pain.  No nausea, no vomiting.  No diarrhea.  No constipation. Genitourinary: Negative for dysuria. Musculoskeletal: Negative for back pain. Skin: Negative for rash. Neurological: Negative for headaches, focal weakness or numbness. 10-point ROS otherwise negative.  ____________________________________________   PHYSICAL EXAM:  VITAL SIGNS: ED Triage Vitals  Enc Vitals Group     BP --      Pulse --      Resp --      Temp --      Temp src --      SpO2 02/10/16 1316 100 %     Weight 02/10/16 1320 109 lb (49.4 kg)     Height 02/10/16 1320 5\' 1"  (1.549 m)     Head Circumference --      Peak Flow --      Pain Score --      Pain Loc --      Pain Edu? --      Excl. in [place]? --    Constitutional: Alert and oriented. Well appearing and in no acute distress. Eyes: Conjunctivae are normal. PERRL. EOMI. Head: Atraumatic. Nose: Bilateral erythema of nasal septum , more prominent with nonbleeding prominent blood vessels on the right nasal septum, there was some dried blood around the external nares. Mouth/Throat: Mucous membranes are moist.  Oropharynx non-erythematous. Neck: No stridor.  No meningeal signs.  Cardiovascular: Normal rate, regular rhythm. Good peripheral circulation. Grossly normal heart sounds.   Respiratory: Normal respiratory effort.  No retractions. Lungs mostly clear except a few basal crackles. Gastrointestinal: Soft and nontender. No distention.  Musculoskeletal: No lower extremity tenderness nor edema. No gross deformities of extremities. Neurologic:  Normal speech and language. No gross focal neurologic deficits are appreciated.  Skin:  Skin is warm, dry  and intact. No rash noted.  ____________________________________________   LABS (all labs ordered are listed, but only abnormal results are displayed)  Labs Reviewed  I-STAT CHEM 8, ED - Abnormal; Notable for the following:       Result Value   BUN 24 (*)    Glucose, Bld 113 (*)    All other components within normal limits   ____________________________________________  EKG  None ____________________________________________  RADIOLOGY  No results found.  ____________________________________________   PROCEDURES  Procedure(s) performed:   Procedures   ____________________________________________   INITIAL IMPRESSION / ASSESSMENT AND PLAN / ED COURSE  Pertinent labs & imaging results that were available during my care of the patient were reviewed by me and considered in my medical decision making (see chart for details).  Sherri Crawford is a 82 y.o. female with past medical history significant for COPD, CAD, DVT of lower extremity on Xarelto, dementia and esophageal achalasia came to ED from skilled nursing facility for evaluation of frequent epistaxis.  On exam she was having urinary nonbleeding, prominent vessels on the right nasal septum.  Her hemoglobin is stable at 13.6.  She was given a dose of Afrin, and Afrin was prescribed twice daily for 3 days.   Later she blew her nose and started getting oozing from her right nostril. Pressure was applied which stopped the bleeding, and then she start bleeding again after picking her nose to clean that blood. Balloon pressure was applied. Her doctor can remove nasal balloon packing in 3 days . She will need an appointment with ENT for cauterization.    ____________________________________________  FINAL CLINICAL IMPRESSION(S) / ED DIAGNOSES  Epistaxis   MEDICATIONS GIVEN DURING THIS VISIT:  Medications  oxymetazoline (AFRIN) 0.05 % nasal spray 2 spray (2 sprays Each Nare Given 02/10/16 1428)     NEW  OUTPATIENT MEDICATIONS STARTED DURING THIS VISIT:  New Prescriptions   HUMIDIFIER MISC    Please use humidifier to prevent excessive dryness.   OXYMETAZOLINE (AFRIN NASAL SPRAY) 0.05 % NASAL SPRAY    Place 2 sprays into both nostrils 2 (two) times daily.   SODIUM CHLORIDE (OCEAN) 0.65 % SOLN NASAL SPRAY    Place 2 sprays into both nostrils 3 (three) times daily.      Note:  This document was prepared using Dragon voice recognition software and may include unintentional dictation errors.   Emergency Medicine   Lorella Nimrod, MD 02/10/16 1605    Leo Grosser, MD 02/11/16 [phone]

## 2016-02-10 NOTE — ED Notes (Signed)
Held pt nose to stop nose bleeding per Dr. Laneta Simmers

## 2016-02-11 ENCOUNTER — Other Ambulatory Visit: Payer: Self-pay | Admitting: *Deleted

## 2016-02-11 NOTE — ED Provider Notes (Signed)
.  Epistaxis Management Date/Time: 02/10/2016 1:40 PM Performed by: Leo Grosser Authorized by: Leo Grosser   Consent:    Consent obtained:  Verbal   Consent given by:  Patient (adult child)   Risks discussed:  Bleeding   Alternatives discussed:  No treatment, alternative treatment and observation Anesthesia (see MAR for exact dosages):    Anesthesia method:  None Procedure details:    Treatment site:  R anterior   Treatment method:  Nasal balloon   Treatment complexity:  Limited   Treatment episode: recurring   Post-procedure details:    Assessment:  Bleeding stopped   Patient tolerance of procedure:  Tolerated well, no immediate complications      Leo Grosser, MD 02/11/16 [phone]

## 2016-02-14 ENCOUNTER — Non-Acute Institutional Stay (SKILLED_NURSING_FACILITY): Payer: Medicare Other | Admitting: Internal Medicine

## 2016-02-14 ENCOUNTER — Encounter (HOSPITAL_COMMUNITY): Payer: Self-pay | Admitting: *Deleted

## 2016-02-14 ENCOUNTER — Emergency Department (HOSPITAL_COMMUNITY)
Admission: EM | Admit: 2016-02-14 | Discharge: 2016-02-14 | Disposition: A | Discharge status: Home / Self Care | Payer: Medicare Other | Attending: Emergency Medicine | Admitting: Emergency Medicine

## 2016-02-14 ENCOUNTER — Encounter: Payer: Self-pay | Admitting: Internal Medicine

## 2016-02-14 DIAGNOSIS — Z7901 Long term (current) use of anticoagulants: Secondary | ICD-10-CM | POA: Diagnosis not present

## 2016-02-14 DIAGNOSIS — I2510 Atherosclerotic heart disease of native coronary artery without angina pectoris: Secondary | ICD-10-CM | POA: Insufficient documentation

## 2016-02-14 DIAGNOSIS — Z96641 Presence of right artificial hip joint: Secondary | ICD-10-CM | POA: Insufficient documentation

## 2016-02-14 DIAGNOSIS — R040 Epistaxis: Secondary | ICD-10-CM

## 2016-02-14 DIAGNOSIS — I10 Essential (primary) hypertension: Secondary | ICD-10-CM | POA: Insufficient documentation

## 2016-02-14 DIAGNOSIS — F0150 Vascular dementia without behavioral disturbance: Secondary | ICD-10-CM

## 2016-02-14 DIAGNOSIS — Z8673 Personal history of transient ischemic attack (TIA), and cerebral infarction without residual deficits: Secondary | ICD-10-CM | POA: Diagnosis not present

## 2016-02-14 DIAGNOSIS — J449 Chronic obstructive pulmonary disease, unspecified: Secondary | ICD-10-CM | POA: Diagnosis not present

## 2016-02-14 NOTE — ED Notes (Signed)
Family at bedside.  Updated on pt's status

## 2016-02-14 NOTE — Discharge Instructions (Signed)
Remove packing in 24 hours

## 2016-02-14 NOTE — ED Triage Notes (Signed)
Per EMS, pt from [facility].  Sent to the ED d/t epistaxis which recurred this am.  States pt was seen at [facility] ED for same on the 14th.  Pt is on Eloquis.  Pt denies any pain at this time.  Pt is alert and oriented per norm.

## 2016-02-14 NOTE — ED Provider Notes (Signed)
[facility] DEPT Provider Note   CSN: [id number] Arrival date & time: 02/14/16  1315     History   Chief Complaint Chief Complaint  Patient presents with  . Epistaxis    HPI Sherri Crawford is a 82 y.o. female.  82 year old female presents with recurrent nosebleeds from the right nostril. Minimal bleeding noted at this time. Patient does take Eliquis. No reported history of hematemesis. No reported history of bloody stools. No reported history of nasal trauma Patient arrives from a nursing home and does have some history of dementia. Was seen by the nurse practitioner there and sent here for further management.      Past Medical History:  Diagnosis Date  . Abnormal weight gain 05/09/2012  . Achalasia of esophagus 09/18/2013   09/18/13 Nifedipine 10mg  tid ac meals. 11/17/14 [facility] f/u as neeed   . Anxiety and depression 03/12/2012   Increased Sertraline to 50mg  daily 07/16/13 08/26/13 Mirtazapine 7.5mg  qhs  10/12/14 Pharm decreased Sertraline to 25mg .  05/04/15 TSH 2.89 09/15/15 pharm taper off Sertraline.  10/08/15 continue Sertraline 25mg  daily, POA desires. 12/02/15 wbc 6.1, Hgb 12.0, plt 271    . Asthmatic bronchitis   . BACK PAIN, LUMBAR 03/12/2007   Hx of lower back pain, had X-ray of her lumbar spine 09/08/12--spondylosis-better controlled with Hydrocodone/ApAp  10/325mg  ac and hs  01/01/13 lumbar spine inj. Able to ambulate with walker on unit until her fall 06/22/13. 06/25/13 unremarkable. X-ray lumbar spine. 11/10/13 dc Oxycodone prn-not used in 60 days.     . COLONIC POLYPS 03/12/2007   Initially noted April 2002 colonoscopy. Followup colonoscopy 05/31/2006 did not disclose any further polyps.    Marland Kitchen COPD (chronic obstructive pulmonary disease) ([place])   . Coronary artery disease   . Deep vein thrombosis of left lower extremity ([place]) 07/19/2012  . Dementia 09/16/2012   05/04/15 TSH 2.89   . Depression   . Diverticulosis 05/10/2012  . DJD (degenerative joint disease)   . Dry skin  dermatitis 11/18/2012  . Edema of leg 07/01/2010  . Epistaxis 01/24/2016   01/21/16: x2, held Xarelto x 1 day, Saline Nasal spray prn, wbc 9.5, Hgb 12.4, Plt 331, Na 138, K 4.5, Bun 25, creat 0.89 01/22/16 [id number])  . Esophageal dysmotility 07/26/2013  . Esophageal dysphagia 05/27/2010   New dx of achalasia 6/ 2015 09/18/13 Nifedipine 10mg  tid ac meals.    . Esophageal stricture   . Essential hypertension, benign 10/30/2013   12/02/15 wbc 6.1, Hgb 12.0, plt 271   . Essential thrombocythemia ([place]) 06/09/2009  . Fall 06/25/2013  . GERD (gastroesophageal reflux disease)   . Hip fracture ([place]) 09/08/2012  . HYPERCHOLESTEROLEMIA 03/12/2007   08/10/15 cholesterol 119, triglycerides 143, HDL 51, LDL 39   . Lumbar back pain   . MACULAR DEGENERATION 01/26/2008   Qualifier: Diagnosis of  By: Lenna Gilford MD, Deborra Medina   . Osteoarthritis 03/12/2007  . Osteoporosis 03/12/2007   Qualifier: Diagnosis of  By: Julien Girt CMA, Marliss Czar  06/15/14 dc Vit D 1000u po daily per pharm.    . Phlebitis and thrombophlebitis of other deep vessels of lower extremities 07/10/2010  . S/P balloon dilatation of esophageal stricture 07/28/2013   07/26/13. Dr. Henrene Pastor. EGD foreign removal and Balloon dilation of esophagus. Esophageal dysmotility, suspected achalasia, dilation at the GE junction to 37mm. Candida esophagitis.    . SYNCOPE 07/30/2009   Occurred 2011. Hospitalized. Felt due to dehydration.    . TRANSIENT ISCHEMIC ATTACKS, HX OF 03/12/2007   Qualifier: Diagnosis of  By: Julien Girt CMA, Marliss Czar      Patient Active Problem List   Diagnosis Date Noted  . Epistaxis 01/24/2016  . Essential thrombocythemia ([place]) 12/28/2014  . Essential hypertension, benign 10/30/2013  . Achalasia of esophagus 09/18/2013  . S/P balloon dilatation of esophageal stricture 07/28/2013  . Esophageal dysmotility 07/26/2013  . Foreign body in esophagus 07/26/2013  . Fall 06/25/2013  . Dry skin dermatitis 11/18/2012  . Dementia 09/16/2012  . Hip fracture ([place])  09/08/2012  . Deep vein thrombosis of left lower extremity ([place]) 07/19/2012  . Anxiety and depression 03/12/2012  . Edema of leg 07/01/2010  . Esophageal dysphagia 05/27/2010  . SYNCOPE 07/30/2009  . MACULAR DEGENERATION 01/26/2008  . Coronary atherosclerosis 03/31/2007  . COLONIC POLYPS 03/12/2007  . HYPERCHOLESTEROLEMIA 03/12/2007  . COPD with asthma ([place]) 03/12/2007  . GERD 03/12/2007  . Osteoarthritis 03/12/2007  . BACK PAIN, LUMBAR 03/12/2007  . Osteoporosis 03/12/2007  . TRANSIENT ISCHEMIC ATTACKS, HX OF 03/12/2007    Past Surgical History:  Procedure Laterality Date  . ABDOMINAL HYSTERECTOMY    . APPENDECTOMY    . BOTOX INJECTION N/A 09/18/2013   Procedure: BOTOX INJECTION;  Surgeon: Lafayette Dragon, MD;  Location: [facility];  Service: Endoscopy;  Laterality: N/A;  . BOTOX INJECTION N/A 10/16/2014   Procedure: BOTOX INJECTION;  Surgeon: Inda Castle, MD;  Location: [facility];  Service: Endoscopy;  Laterality: N/A;  . CATARACT EXTRACTION W/ INTRAOCULAR LENS  IMPLANT, BILATERAL    . CHOLECYSTECTOMY    . ESOPHAGEAL MANOMETRY N/A 08/25/2013   Procedure: ESOPHAGEAL MANOMETRY (EM);  Surgeon: Jerene Bears, MD;  Location: [facility];  Service: Gastroenterology;  Laterality: N/A;  . ESOPHAGOGASTRODUODENOSCOPY N/A 07/26/2013   Procedure: ESOPHAGOGASTRODUODENOSCOPY (EGD);  Surgeon: Irene Shipper, MD;  Location: [facility] ENDOSCOPY;  Service: Endoscopy;  Laterality: N/A;  . ESOPHAGOGASTRODUODENOSCOPY N/A 10/16/2014   Procedure: ESOPHAGOGASTRODUODENOSCOPY (EGD);  Surgeon: Inda Castle, MD;  Location: [facility];  Service: Endoscopy;  Laterality: N/A;  with botox  . ESOPHAGOGASTRODUODENOSCOPY (EGD) WITH ESOPHAGEAL DILATION N/A 11/25/2012   Procedure: ESOPHAGOGASTRODUODENOSCOPY (EGD) WITH ESOPHAGEAL DILATION;  Surgeon: Jerene Bears, MD;  Location: [facility];  Service: Gastroenterology;  Laterality: N/A;  . ESOPHAGOGASTRODUODENOSCOPY (EGD) WITH PROPOFOL N/A 09/18/2013   Procedure:  ESOPHAGOGASTRODUODENOSCOPY (EGD) WITH PROPOFOL;  Surgeon: Lafayette Dragon, MD;  Location: [facility];  Service: Endoscopy;  Laterality: N/A;  . HIP ARTHROPLASTY Right 09/09/2012   Procedure: ARTHROPLASTY BIPOLAR HIP;  Surgeon: Mauri Pole, MD;  Location: [facility];  Service: Orthopedics;  Laterality: Right;  . TAH and BSO  08/21/1995    OB History    No data available       Home Medications    Prior to Admission medications   Medication Sig Start Date End Date Taking? Authorizing Provider  acetaminophen (TYLENOL) 325 MG tablet Take 650 mg by mouth every 4 (four) hours as needed.    Historical Provider, MD  albuterol (PROVENTIL HFA;VENTOLIN HFA) 108 (90 BASE) MCG/ACT inhaler Inhale 2 puffs into the lungs every 6 (six) hours as needed for wheezing or shortness of breath.    Historical Provider, MD  atorvastatin (LIPITOR) 20 MG tablet Take 20 mg by mouth every evening.  10/25/11   Noralee Space, MD  CHLORHEXIDINE GLUCONATE, BULK, SOLN Take 5 mLs by mouth daily. 5 ml swish and spit for 30 seconds every day indefinitely     Historical Provider, MD  feeding supplement (RESOURCE BREEZE) LIQD Take 90 mLs by mouth 2 (two) times daily  between meals.     Historical Provider, MD  fluticasone furoate-vilanterol (BREO ELLIPTA) 100-25 MCG/INH AEPB Inhale 1 puff into the lungs daily. One puff inhale once a day, rinse mouth after each use.     Historical Provider, MD  Humidifier MISC Please use humidifier to prevent excessive dryness. 02/10/16   Lorella Nimrod, MD  HYDROcodone-acetaminophen (NORCO) 10-325 MG per tablet Take 1 tablet by mouth 4 (four) times daily.    Historical Provider, MD  hydroxyurea (HYDREA) 500 MG capsule Take 500-1,000 mg by mouth See admin instructions. Takes 1000 mg on Monday only Takes 500 mg on Sunday, Wednesday and Friday only 04/27/11   Annia Belt, MD  memantine (NAMENDA) 10 MG tablet Take 10 mg by mouth 2 (two) times daily. Take one tablet twice daily for memory      Historical Provider, MD  metoprolol tartrate (LOPRESSOR) 25 MG tablet Take 25 mg by mouth 2 (two) times daily.    Historical Provider, MD  montelukast (SINGULAIR) 10 MG tablet Take 10 mg by mouth at bedtime.    Historical Provider, MD  Multiple Vitamins-Minerals (PRESERVISION AREDS 2) CAPS Take 1 capsule by mouth 2 (two) times daily.    Historical Provider, MD  NIFEdipine (PROCARDIA) 10 MG capsule Take 10 mg by mouth 3 (three) times daily. Squeeze contents of one capsule uner tongue 15-30 minutes before meals 3 times a day     Historical Provider, MD  nitroGLYCERIN (NITROSTAT) 0.4 MG SL tablet Place 0.4 mg under the tongue every 5 (five) minutes as needed for chest pain.    Historical Provider, MD  omeprazole (PRILOSEC) 20 MG capsule Take 20 mg by mouth at bedtime.  05/15/12   Historical Provider, MD  oxymetazoline (AFRIN) 0.05 % nasal spray Place 1 spray into both nostrils. Two sprays in both nostrils two times daily    Historical Provider, MD  Rivaroxaban (XARELTO) 15 MG TABS tablet Take 15 mg by mouth daily with supper.     Historical Provider, MD  sertraline (ZOLOFT) 25 MG tablet Take 25 mg by mouth at bedtime.     Historical Provider, MD  sodium chloride (OCEAN) 0.65 % SOLN nasal spray Place 2 sprays into both nostrils 3 (three) times daily. 02/10/16   Lorella Nimrod, MD  sodium fluoride (DENTA 5000 PLUS) 1.1 % CREA dental cream Place 1 application onto teeth at bedtime.    Historical Provider, MD    Family History Family History  Problem Relation Age of Onset  . Colon cancer Son 58  . Crohn's disease Son   . Cancer Maternal Aunt     Social History Social History  Substance Use Topics  . Smoking status: Never Smoker  . Smokeless tobacco: Never Used  . Alcohol use No     Allergies   Sulfamethoxazole   Review of Systems Review of Systems  All other systems reviewed and are negative.    Physical Exam Updated Vital Signs BP 146/59 (BP Location: Left Arm)   Pulse 70   Temp  98.7 F (37.1 C) (Oral)   Resp 18   SpO2 97%   Physical Exam  Constitutional: She is oriented to person, place, and time. She appears well-developed and well-nourished.  Non-toxic appearance. No distress.  HENT:  Head: Normocephalic and atraumatic.  Nose: Epistaxis is observed.    Eyes: Conjunctivae, EOM and lids are normal. Pupils are equal, round, and reactive to light.  Neck: Normal range of motion. Neck supple. No tracheal deviation present. No thyroid mass present.  Cardiovascular: Normal rate, regular rhythm and normal heart sounds.  Exam reveals no gallop.   No murmur heard. Pulmonary/Chest: Effort normal and breath sounds normal. No stridor. No respiratory distress. She has no decreased breath sounds. She has no wheezes. She has no rhonchi. She has no rales.  Abdominal: Soft. Normal appearance and bowel sounds are normal. She exhibits no distension. There is no tenderness. There is no rebound and no CVA tenderness.  Musculoskeletal: Normal range of motion. She exhibits no edema or tenderness.  Neurological: She is alert and oriented to person, place, and time. She has normal strength. No cranial nerve deficit or sensory deficit. GCS eye subscore is 4. GCS verbal subscore is 5. GCS motor subscore is 6.  Skin: Skin is warm and dry. No abrasion and no rash noted.  Psychiatric: She has a normal mood and affect. Her speech is normal and behavior is normal.  Nursing note and vitals reviewed.    ED Treatments / Results  Labs (all labs ordered are listed, but only abnormal results are displayed) Labs Reviewed - No data to display  EKG  EKG Interpretation None       Radiology No results found.  Procedures .Epistaxis Management Date/Time: 02/14/2016 3:24 PM Performed by: Lacretia Leigh Authorized by: Lacretia Leigh   Consent:    Consent obtained:  Verbal Anesthesia (see MAR for exact dosages):    Anesthesia method:  None Procedure details:    Treatment site:  R  anterior   Treatment method:  Nasal tampon   Treatment complexity:  Limited   Treatment episode: initial   Post-procedure details:    Assessment:  Bleeding stopped   Patient tolerance of procedure:  Tolerated well, no immediate complications   (including critical care time)  Medications Ordered in ED Medications - No data to display   Initial Impression / Assessment and Plan / ED Course  I have reviewed the triage vital signs and the nursing notes.  Pertinent labs & imaging results that were available during my care of the patient were reviewed by me and considered in my medical decision making (see chart for details).  Clinical Course     Pt monitored here and no recurrent bleeding Will f/u her pcp  Final Clinical Impressions(s) / ED Diagnoses   Final diagnoses:  None    New Prescriptions New Prescriptions   No medications on file     Lacretia Leigh, MD 02/14/16 1525

## 2016-02-14 NOTE — Progress Notes (Signed)
Progress Note     Location:  [facility] Room Number: N35 Place of Service:  SNF (31)  PCP: Jeanmarie Hubert, MD Patient Care Team: Estill Dooms, MD as PCP - General (Internal Medicine) Estill Dooms, MD as PCP - Internal Medicine (Internal Medicine) Annia Belt, MD as Consulting Physician (Hematology and Oncology) Suella Broad, MD as Consulting Physician (Physical Medicine and Rehabilitation) Noralee Space, MD (Pulmonary Disease) [facility] Otho Darner, NP as Nurse Practitioner (Nurse Practitioner) Inda Castle, MD as Consulting Physician (Gastroenterology)  Extended Emergency Contact Information Primary Emergency Contact: Carel,Gene Address: [address]          [place], [place] [address] Johnnette Litter of [place] Phone: [phone] Work Phone: [phone] Mobile Phone: [phone] Relation: Son Secondary Emergency Contact: [name], [place] of [place] Phone: [phone] Relation: Son  Code Status: DNR Goals of Care: Advanced Directive information Advanced Directives 02/14/2016  Does Patient Have a Medical Advance Directive? Yes  Type of Paramedic of [place];Living will;Out of facility DNR (pink MOST or yellow form)  Does patient want to make changes to medical advance directive? -  Copy of [place] in Chart? Yes  Pre-existing out of facility DNR order (yellow form or pink MOST form) Yellow form placed in chart (order not valid for inpatient use)      Chief Complaint  Patient presents with  . Medical Management of Chronic Issues    follow-up on nose bleeding and ED visit 02/10/16. Nose still bleeding. Patient won't leave ballon in nose.     HPI: Patient is a 82 y.o. female seen today for recurrent epistaxis. She was seen in the ER 02/10/16 and a nasal balloon was placed to stop a prolonged nose bleed from the left  kiesselbach's plexus. Arteriolar pumping wa noted at NH prior to transfer to the ER on that date. Unfortunately, she has some dementia and pulled out the nasal balloon. Epistaxis started again today. She is currently on Xarelto and this complicates matters. We were unable to stop the epistaxis today, so she was sent back to the ER.I plant stop her Xarelto for the next 2 weeks.  Past Medical History:  Diagnosis Date  . Abnormal weight gain 05/09/2012  . Achalasia of esophagus 09/18/2013   09/18/13 Nifedipine 10mg  tid ac meals. 11/17/14 [facility] f/u as neeed   . Anxiety and depression 03/12/2012   Increased Sertraline to 50mg  daily 07/16/13 08/26/13 Mirtazapine 7.5mg  qhs  10/12/14 Pharm decreased Sertraline to 25mg .  05/04/15 TSH 2.89 09/15/15 pharm taper off Sertraline.  10/08/15 continue Sertraline 25mg  daily, POA desires. 12/02/15 wbc 6.1, Hgb 12.0, plt 271    . Asthmatic bronchitis   . BACK PAIN, LUMBAR 03/12/2007   Hx of lower back pain, had X-ray of her lumbar spine 09/08/12--spondylosis-better controlled with Hydrocodone/ApAp  10/325mg  ac and hs  01/01/13 lumbar spine inj. Able to ambulate with walker on unit until her fall 06/22/13. 06/25/13 unremarkable. X-ray lumbar spine. 11/10/13 dc Oxycodone prn-not used in 60 days.     . COLONIC POLYPS 03/12/2007   Initially noted April 2002 colonoscopy. Followup colonoscopy 05/31/2006 did not disclose any further polyps.    Marland Kitchen COPD (chronic obstructive pulmonary disease) ([place])   . Coronary artery disease   . Deep vein thrombosis of left lower extremity ([place]) 07/19/2012  . Dementia 09/16/2012   05/04/15  TSH 2.89   . Depression   . Diverticulosis 05/10/2012  . DJD (degenerative joint disease)   . Dry skin dermatitis 11/18/2012  . Edema of leg 07/01/2010  . Epistaxis 01/24/2016   01/21/16: x2, held Xarelto x 1 day, Saline Nasal spray prn, wbc 9.5, Hgb 12.4, Plt 331, Na 138, K 4.5, Bun 25, creat 0.89 01/22/16 [id number])  . Esophageal dysmotility 07/26/2013  . Esophageal  dysphagia 05/27/2010   New dx of achalasia 6/ 2015 09/18/13 Nifedipine 10mg  tid ac meals.    . Esophageal stricture   . Essential hypertension, benign 10/30/2013   12/02/15 wbc 6.1, Hgb 12.0, plt 271   . Essential thrombocythemia ([facility]) 06/09/2009  . Fall 06/25/2013  . GERD (gastroesophageal reflux disease)   . Hip fracture ([facility]) 09/08/2012  . HYPERCHOLESTEROLEMIA 03/12/2007   08/10/15 cholesterol 119, triglycerides 143, HDL 51, LDL 39   . Lumbar back pain   . MACULAR DEGENERATION 01/26/2008   Qualifier: Diagnosis of  By: Lenna Gilford MD, Deborra Medina   . Osteoarthritis 03/12/2007  . Osteoporosis 03/12/2007   Qualifier: Diagnosis of  By: Julien Girt CMA, Marliss Czar  06/15/14 dc Vit D 1000u po daily per pharm.    . Phlebitis and thrombophlebitis of other deep vessels of lower extremities 07/10/2010  . S/P balloon dilatation of esophageal stricture 07/28/2013   07/26/13. Dr. Henrene Pastor. EGD foreign removal and Balloon dilation of esophagus. Esophageal dysmotility, suspected achalasia, dilation at the GE junction to 23mm. Candida esophagitis.    . SYNCOPE 07/30/2009   Occurred 2011. Hospitalized. Felt due to dehydration.    . TRANSIENT ISCHEMIC ATTACKS, HX OF 03/12/2007   Qualifier: Diagnosis of  By: Julien Girt CMA, Marliss Czar     Past Surgical History:  Procedure Laterality Date  . ABDOMINAL HYSTERECTOMY    . APPENDECTOMY    . BOTOX INJECTION N/A 09/18/2013   Procedure: BOTOX INJECTION;  Surgeon: Lafayette Dragon, MD;  Location: [facility];  Service: Endoscopy;  Laterality: N/A;  . BOTOX INJECTION N/A 10/16/2014   Procedure: BOTOX INJECTION;  Surgeon: Inda Castle, MD;  Location: [facility];  Service: Endoscopy;  Laterality: N/A;  . CATARACT EXTRACTION W/ INTRAOCULAR LENS  IMPLANT, BILATERAL    . CHOLECYSTECTOMY    . ESOPHAGEAL MANOMETRY N/A 08/25/2013   Procedure: ESOPHAGEAL MANOMETRY (EM);  Surgeon: Jerene Bears, MD;  Location: [facility];  Service: Gastroenterology;  Laterality: N/A;  . ESOPHAGOGASTRODUODENOSCOPY N/A 07/26/2013    Procedure: ESOPHAGOGASTRODUODENOSCOPY (EGD);  Surgeon: Irene Shipper, MD;  Location: [facility] ENDOSCOPY;  Service: Endoscopy;  Laterality: N/A;  . ESOPHAGOGASTRODUODENOSCOPY N/A 10/16/2014   Procedure: ESOPHAGOGASTRODUODENOSCOPY (EGD);  Surgeon: Inda Castle, MD;  Location: [facility];  Service: Endoscopy;  Laterality: N/A;  with botox  . ESOPHAGOGASTRODUODENOSCOPY (EGD) WITH ESOPHAGEAL DILATION N/A 11/25/2012   Procedure: ESOPHAGOGASTRODUODENOSCOPY (EGD) WITH ESOPHAGEAL DILATION;  Surgeon: Jerene Bears, MD;  Location: [facility];  Service: Gastroenterology;  Laterality: N/A;  . ESOPHAGOGASTRODUODENOSCOPY (EGD) WITH PROPOFOL N/A 09/18/2013   Procedure: ESOPHAGOGASTRODUODENOSCOPY (EGD) WITH PROPOFOL;  Surgeon: Lafayette Dragon, MD;  Location: [facility];  Service: Endoscopy;  Laterality: N/A;  . HIP ARTHROPLASTY Right 09/09/2012   Procedure: ARTHROPLASTY BIPOLAR HIP;  Surgeon: Mauri Pole, MD;  Location: [facility];  Service: Orthopedics;  Laterality: Right;  . TAH and BSO  08/21/1995    reports that she has never smoked. She has never used smokeless tobacco. She reports that she does not drink alcohol or use drugs. Social History   Social History  . Marital status: Widowed  Spouse name: Gwyndolyn Saxon  . Number of children: 3  . Years of education: N/A   Occupational History  . retired     Veterinary surgeon work  .  Retired   Social History Main Topics  . Smoking status: Never Smoker  . Smokeless tobacco: Never Used  . Alcohol use No  . Drug use: No  . Sexual activity: No   Other Topics Concern  . Not on file   Social History Narrative   Lives at [facility] since 09/11/12   Widowed   Never Smoked   Alcohol none   POA, DRN, Living Will           Functional Status Survey:    Family History  Problem Relation Age of Onset  . Colon cancer Son 43  . Crohn's disease Son   . Cancer Maternal Aunt     Health Maintenance  Topic Date Due  . TETANUS/TDAP  12/21/1941  . ZOSTAVAX   12/22/1982  . PNA vac Low Risk Adult (2 of 2 - PCV13) 02/27/1989  . INFLUENZA VACCINE  Completed  . DEXA SCAN  Completed    Allergies  Allergen Reactions  . Sulfamethoxazole Rash    Allergies as of 02/14/2016      Reactions   Sulfamethoxazole Rash      Medication List       Accurate as of 02/14/16 10:47 AM. Always use your most recent med list.          acetaminophen 325 MG tablet Commonly known as:  TYLENOL Take 650 mg by mouth every 4 (four) hours as needed.   albuterol 108 (90 Base) MCG/ACT inhaler Commonly known as:  PROVENTIL HFA;VENTOLIN HFA Inhale 2 puffs into the lungs every 6 (six) hours as needed for wheezing or shortness of breath.   atorvastatin 20 MG tablet Commonly known as:  LIPITOR Take 20 mg by mouth every evening.   BREO ELLIPTA 100-25 MCG/INH Aepb Generic drug:  fluticasone furoate-vilanterol Inhale 1 puff into the lungs daily. One puff inhale once a day, rinse mouth after each use.   CHLORHEXIDINE GLUCONATE (BULK) Soln Take 5 mLs by mouth daily. 5 ml swish and spit for 30 seconds every day indefinitely   DENTA 5000 PLUS 1.1 % Crea dental cream Generic drug:  sodium fluoride Place 1 application onto teeth at bedtime.   feeding supplement Liqd Take 90 mLs by mouth 2 (two) times daily between meals.   Humidifier Misc Please use humidifier to prevent excessive dryness.   HYDROcodone-acetaminophen 10-325 MG tablet Commonly known as:  NORCO Take 1 tablet by mouth 4 (four) times daily.   hydroxyurea 500 MG capsule Commonly known as:  HYDREA Take 500-1,000 mg by mouth See admin instructions. Takes 1000 mg on Monday only Takes 500 mg on Sunday, Wednesday and Friday only   memantine 10 MG tablet Commonly known as:  NAMENDA Take 10 mg by mouth 2 (two) times daily. Take one tablet twice daily for memory   metoprolol tartrate 25 MG tablet Commonly known as:  LOPRESSOR Take 25 mg by mouth 2 (two) times daily.   montelukast 10 MG  tablet Commonly known as:  SINGULAIR Take 10 mg by mouth at bedtime.   NIFEdipine 10 MG capsule Commonly known as:  PROCARDIA Take 10 mg by mouth 3 (three) times daily. Squeeze contents of one capsule uner tongue 15-30 minutes before meals 3 times a day   nitroGLYCERIN 0.4 MG SL tablet Commonly known as:  NITROSTAT Place 0.4 mg  under the tongue every 5 (five) minutes as needed for chest pain.   omeprazole 20 MG capsule Commonly known as:  PRILOSEC Take 20 mg by mouth at bedtime.   oxymetazoline 0.05 % nasal spray Commonly known as:  AFRIN Place 1 spray into both nostrils. Two sprays in both nostrils two times daily   PRESERVISION AREDS 2 Caps Take 1 capsule by mouth 2 (two) times daily.   Rivaroxaban 15 MG Tabs tablet Commonly known as:  XARELTO Take 15 mg by mouth daily with supper.   sertraline 25 MG tablet Commonly known as:  ZOLOFT Take 25 mg by mouth at bedtime.   sodium chloride 0.65 % Soln nasal spray Commonly known as:  OCEAN Place 2 sprays into both nostrils 3 (three) times daily.       Review of Systems  Constitutional: Negative for chills, diaphoresis and fever.  HENT: Positive for hearing loss and nosebleeds (right nasal septum). Negative for congestion, ear discharge, ear pain, sore throat and tinnitus.   Eyes: Negative for photophobia, pain, discharge and redness.  Respiratory: Positive for shortness of breath (Morning are worse, but she remains dyspneic throughout the day.). Negative for cough, wheezing and stridor.   Cardiovascular: Negative for chest pain, palpitations and leg swelling.       Hx of Left DVT  Gastrointestinal: Negative for abdominal pain, blood in stool, constipation, diarrhea, nausea and vomiting.  Endocrine: Negative for polydipsia.  Genitourinary: Positive for frequency. Negative for dysuria, flank pain, hematuria and urgency.  Musculoskeletal: Positive for back pain and gait problem. Negative for myalgias and neck pain.       Lower  extremity weakness. [place] wheelchair.  Skin: Negative for rash.  Allergic/Immunologic: Negative for environmental allergies.  Neurological: Negative for dizziness, tremors, seizures, weakness and headaches.  Hematological: Negative.  Does not bruise/bleed easily.  Psychiatric/Behavioral: Positive for confusion. Negative for hallucinations and suicidal ideas. The patient is nervous/anxious.     Vitals:   02/14/16 1026  BP: 140/62  Pulse: 60  Resp: 19  Temp: 99.3 F (37.4 C)  SpO2: 96%  Weight: 109 lb (49.4 kg)  Height: 5\' 1"  (1.549 m)   Body mass index is 20.6 kg/m. Physical Exam  Constitutional: She is oriented to person, place, and time. She appears well-developed and well-nourished. No distress.  HENT:  Head: Normocephalic and atraumatic.  Right nasal septal bleeding at Kiesselbach's plexus. Arteriolar pumper.  Eyes: Conjunctivae and EOM are normal. Pupils are equal, round, and reactive to light.  Neck: Normal range of motion. Neck supple. No JVD present. No thyromegaly present.  Cardiovascular: Normal rate, regular rhythm and normal heart sounds.   Pulmonary/Chest: Effort normal. She has decreased breath sounds. She has no wheezes. She has no rales.  Decreased breath sounds Bilat. Lungs.   Abdominal: Soft. Bowel sounds are normal. There is no tenderness.  Musculoskeletal: Normal range of motion. She exhibits edema. She exhibits no tenderness.  Lower back-baseline: pain is well controlled with prn inj, narcotics, w/c   Lymphadenopathy:    She has no cervical adenopathy.  Neurological: She is alert and oriented to person, place, and time. She has normal reflexes. No cranial nerve deficit. She exhibits normal muscle tone. Coordination normal.  8/ 26/16 BIMS score 14/15  Skin: Skin is warm and dry. No rash noted. She is not diaphoretic. No erythema (LLE).  The right hip surgical scar  Psychiatric: She has a normal mood and affect. Her affect is not angry, not blunt, not labile  and not  inappropriate. Her speech is not delayed, not tangential and not slurred. She is hyperactive and slowed. She is not agitated, not aggressive, not withdrawn and not combative. Thought content is not paranoid and not delusional. Cognition and memory are impaired. She does not express impulsivity or inappropriate judgment. She exhibits abnormal recent memory.    Labs reviewed: Basic Metabolic Panel:  Recent Labs  05/04/15 01/21/16 02/10/16 1530  NA 140 138 139  K 4.5 4.5 4.3  CL  --   --  101  GLUCOSE  --   --  113*  BUN 22* 25* 24*  CREATININE 0.9 0.9 0.90   Liver Function Tests:  Recent Labs  05/04/15  AST 14  ALT 10  ALKPHOS 56   No results for input(s): LIPASE, AMYLASE in the last 8760 hours. No results for input(s): AMMONIA in the last 8760 hours. CBC:  Recent Labs  10/05/15 12/02/15 01/21/16 02/10/16 1530  WBC 7.3 6.1 9.5  --   HGB 11.8* 12.0 12.4 13.6  HCT 36 37 37 40.0  PLT 271 271 331  --    Cardiac Enzymes: No results for input(s): CKTOTAL, CKMB, CKMBINDEX, TROPONINI in the last 8760 hours. BNP: Invalid input(s): POCBNP Lab Results  Component Value Date   HGBA1C 5.5 10/16/2014   Lab Results  Component Value Date   TSH 2.89 05/04/2015   No results found for: VITAMINB12 No results found for: FOLATE Lab Results  Component Value Date   IRON 53 06/16/2009   TIBC 388 06/16/2009   FERRITIN 12 06/16/2009     Assessment/Plan 1. Epistaxis Return to Er. I presume a nasal balloon will be inserted again.  2. Vascular dementia without behavioral disturbance unchanged  3. Essential hypertension, benign controlled

## 2016-02-16 DIAGNOSIS — Z7901 Long term (current) use of anticoagulants: Secondary | ICD-10-CM | POA: Diagnosis not present

## 2016-02-16 DIAGNOSIS — R040 Epistaxis: Secondary | ICD-10-CM | POA: Diagnosis not present

## 2016-02-17 ENCOUNTER — Encounter: Payer: Self-pay | Admitting: Internal Medicine

## 2016-02-17 ENCOUNTER — Non-Acute Institutional Stay (SKILLED_NURSING_FACILITY): Payer: Medicare Other | Admitting: Internal Medicine

## 2016-02-17 DIAGNOSIS — R040 Epistaxis: Secondary | ICD-10-CM

## 2016-02-17 DIAGNOSIS — F0150 Vascular dementia without behavioral disturbance: Secondary | ICD-10-CM | POA: Diagnosis not present

## 2016-02-17 DIAGNOSIS — Z7901 Long term (current) use of anticoagulants: Secondary | ICD-10-CM

## 2016-02-17 NOTE — Progress Notes (Signed)
Progress Note    Location:  [facility] Room Number: N35 Place of Service:  SNF [phone]) Provider:  Jeanmarie Hubert, MD  Patient Care Team: Estill Dooms, MD as PCP - General (Internal Medicine) Estill Dooms, MD as PCP - Internal Medicine (Internal Medicine) Annia Belt, MD as Consulting Physician (Hematology and Oncology) Suella Broad, MD as Consulting Physician (Physical Medicine and Rehabilitation) Noralee Space, MD (Pulmonary Disease) [facility] Otho Darner, NP as Nurse Practitioner (Nurse Practitioner) Inda Castle, MD as Consulting Physician (Gastroenterology)  Extended Emergency Contact Information Primary Emergency Contact: Thong,Gene Address: [address]          [place], [place] [address] [place] Phone: [phone] Work Phone: [phone] Mobile Phone: [phone] Relation: Son Secondary Emergency Contact: [name], [place] Phone: [phone] Relation: Son  Code Status:  DNR Goals of care: Advanced Directive information Advanced Directives 02/17/2016  Does Patient Have a Medical Advance Directive? Yes  Type of Paramedic of [place];Living will;Out of facility DNR (pink MOST or yellow form)  Does patient want to make changes to medical advance directive? -  Copy of [place] in Chart? Yes  Pre-existing out of facility DNR order (yellow form or pink MOST form) Yellow form placed in chart (order not valid for inpatient use)     Chief Complaint  Patient presents with  . Medical Management of Chronic Issues    Epistaxis in ED 02/14/16. Saw ENT 02/16/16     HPI:  Pt is a 82 y.o. female seen today for medical management of chronic diseases and acute reevaluation of epistaxis.  Patient had to be sent to the ER for another episode of epistaxis on 02/14/16. She returned with nasal balloon. She  again pulled it out soon after her return. She saw ENT yesterday and had cautery to the right nasal septum. She is not bleeding. I have stopped her anticoagulation for 2 weeks. She is comfortable and cheerful today.  Family has expressed concerns about her continuing memory problems. It may have gotten worse lately. She has been stressed by the repeated nosebleeds and trips to the ER.  Past Medical History:  Diagnosis Date  . Abnormal weight gain 05/09/2012  . Achalasia of esophagus 09/18/2013   09/18/13 Nifedipine 10mg  tid ac meals. 11/17/14 [facility] f/u as neeed   . Anxiety and depression 03/12/2012   Increased Sertraline to 50mg  daily 07/16/13 08/26/13 Mirtazapine 7.5mg  qhs  10/12/14 Pharm decreased Sertraline to 25mg .  05/04/15 TSH 2.89 09/15/15 pharm taper off Sertraline.  10/08/15 continue Sertraline 25mg  daily, POA desires. 12/02/15 wbc 6.1, Hgb 12.0, plt 271    . Asthmatic bronchitis   . BACK PAIN, LUMBAR 03/12/2007   Hx of lower back pain, had X-ray of her lumbar spine 09/08/12--spondylosis-better controlled with Hydrocodone/ApAp  10/325mg  ac and hs  01/01/13 lumbar spine inj. Able to ambulate with walker on unit until her fall 06/22/13. 06/25/13 unremarkable. X-ray lumbar spine. 11/10/13 dc Oxycodone prn-not used in 60 days.     . COLONIC POLYPS 03/12/2007   Initially noted April 2002 colonoscopy. Followup colonoscopy 05/31/2006 did not disclose any further polyps.    Marland Kitchen COPD (chronic obstructive pulmonary disease) ([place])   . Coronary artery disease   . Deep vein thrombosis of left lower extremity ([place]) 07/19/2012  . Dementia 09/16/2012   05/04/15  TSH 2.89   . Depression   . Diverticulosis 05/10/2012  . DJD (degenerative joint disease)   . Dry skin dermatitis 11/18/2012  . Edema of leg 07/01/2010  . Epistaxis 01/24/2016   01/21/16: x2, held Xarelto x 1 day, Saline Nasal spray prn, wbc 9.5, Hgb 12.4, Plt 331, Na 138, K 4.5, Bun 25, creat 0.89 01/22/16 [id number]  . Esophageal dysmotility 07/26/2013  .  Esophageal dysphagia 05/27/2010   New dx of achalasia 6/ 2015 09/18/13 Nifedipine 10mg  tid ac meals.    . Esophageal stricture   . Essential hypertension, benign 10/30/2013   12/02/15 wbc 6.1, Hgb 12.0, plt 271   . Essential thrombocythemia ([place]) 06/09/2009  . Fall 06/25/2013  . GERD (gastroesophageal reflux disease)   . Hip fracture ([place]) 09/08/2012  . HYPERCHOLESTEROLEMIA 03/12/2007   08/10/15 cholesterol 119, triglycerides 143, HDL 51, LDL 39   . Lumbar back pain   . MACULAR DEGENERATION 01/26/2008   Qualifier: Diagnosis of  By: Lenna Gilford MD, Deborra Medina   . Osteoarthritis 03/12/2007  . Osteoporosis 03/12/2007   Qualifier: Diagnosis of  By: Julien Girt CMA, Marliss Czar  06/15/14 dc Vit D 1000u po daily per pharm.    . Phlebitis and thrombophlebitis of other deep vessels of lower extremities 07/10/2010  . S/P balloon dilatation of esophageal stricture 07/28/2013   07/26/13. Dr. Henrene Pastor. EGD foreign removal and Balloon dilation of esophagus. Esophageal dysmotility, suspected achalasia, dilation at the GE junction to 98mm. Candida esophagitis.    . SYNCOPE 07/30/2009   Occurred 2011. Hospitalized. Felt due to dehydration.    . TRANSIENT ISCHEMIC ATTACKS, HX OF 03/12/2007   Qualifier: Diagnosis of  By: Julien Girt CMA, Marliss Czar     Past Surgical History:  Procedure Laterality Date  . ABDOMINAL HYSTERECTOMY    . APPENDECTOMY    . BOTOX INJECTION N/A 09/18/2013   Procedure: BOTOX INJECTION;  Surgeon: Lafayette Dragon, MD;  Location: [facility] ENDOSCOPY;  Service: Endoscopy;  Laterality: N/A;  . BOTOX INJECTION N/A 10/16/2014   Procedure: BOTOX INJECTION;  Surgeon: Inda Castle, MD;  Location: [facility];  Service: Endoscopy;  Laterality: N/A;  . CATARACT EXTRACTION W/ INTRAOCULAR LENS  IMPLANT, BILATERAL    . CHOLECYSTECTOMY    . ESOPHAGEAL MANOMETRY N/A 08/25/2013   Procedure: ESOPHAGEAL MANOMETRY (EM);  Surgeon: Jerene Bears, MD;  Location: [facility];  Service: Gastroenterology;  Laterality: N/A;  . ESOPHAGOGASTRODUODENOSCOPY N/A  07/26/2013   Procedure: ESOPHAGOGASTRODUODENOSCOPY (EGD);  Surgeon: Irene Shipper, MD;  Location: [facility] ENDOSCOPY;  Service: Endoscopy;  Laterality: N/A;  . ESOPHAGOGASTRODUODENOSCOPY N/A 10/16/2014   Procedure: ESOPHAGOGASTRODUODENOSCOPY (EGD);  Surgeon: Inda Castle, MD;  Location: [facility];  Service: Endoscopy;  Laterality: N/A;  with botox  . ESOPHAGOGASTRODUODENOSCOPY (EGD) WITH ESOPHAGEAL DILATION N/A 11/25/2012   Procedure: ESOPHAGOGASTRODUODENOSCOPY (EGD) WITH ESOPHAGEAL DILATION;  Surgeon: Jerene Bears, MD;  Location: [facility];  Service: Gastroenterology;  Laterality: N/A;  . ESOPHAGOGASTRODUODENOSCOPY (EGD) WITH PROPOFOL N/A 09/18/2013   Procedure: ESOPHAGOGASTRODUODENOSCOPY (EGD) WITH PROPOFOL;  Surgeon: Lafayette Dragon, MD;  Location: [facility] END[facility];  Service: Endoscopy;  Laterality: N/A;  . HIP ARTHROPLASTY Right 09/09/2012   Procedure: ARTHROPLASTY BIPOLAR HIP;  Surgeon: Mauri Pole, MD;  Location: [facility];  Service: Orthopedics;  Laterality: Right;  . TAH and BSO  08/21/1995    Allergies  Allergen Reactions  . Sulfamethoxazole Rash    Allergies as of 02/17/2016      Reactions   Sulfamethoxazole Rash      Medication List  Accurate as of 02/17/16 10:54 AM. Always use your most recent med list.          acetaminophen 325 MG tablet Commonly known as:  TYLENOL Take 650 mg by mouth every 4 (four) hours as needed.   albuterol 108 (90 Base) MCG/ACT inhaler Commonly known as:  PROVENTIL HFA;VENTOLIN HFA Inhale 2 puffs into the lungs every 6 (six) hours as needed for wheezing or shortness of breath.   atorvastatin 20 MG tablet Commonly known as:  LIPITOR Take 20 mg by mouth every evening.   BREO ELLIPTA 100-25 MCG/INH Aepb Generic drug:  fluticasone furoate-vilanterol Inhale 1 puff into the lungs daily. One puff inhale once a day, rinse mouth after each use.   CHLORHEXIDINE GLUCONATE (BULK) Soln Take 5 mLs by mouth daily. 5 ml swish and spit for 30 seconds  every day indefinitely   DENTA 5000 PLUS 1.1 % Crea dental cream Generic drug:  sodium fluoride Place 1 application onto teeth at bedtime.   feeding supplement Liqd Take 90 mLs by mouth 2 (two) times daily between meals.   Humidifier Misc Please use humidifier to prevent excessive dryness.   HYDROcodone-acetaminophen 10-325 MG tablet Commonly known as:  NORCO Take 1 tablet by mouth 4 (four) times daily.   hydroxyurea 500 MG capsule Commonly known as:  HYDREA Take 500-1,000 mg by mouth See admin instructions. Takes 1000 mg on Monday only Takes 500 mg on Sunday, Wednesday and Friday only   memantine 10 MG tablet Commonly known as:  NAMENDA Take 10 mg by mouth 2 (two) times daily. Take one tablet twice daily for memory   metoprolol tartrate 25 MG tablet Commonly known as:  LOPRESSOR Take 25 mg by mouth 2 (two) times daily.   montelukast 10 MG tablet Commonly known as:  SINGULAIR Take 10 mg by mouth at bedtime.   NIFEdipine 10 MG capsule Commonly known as:  PROCARDIA Take 10 mg by mouth 3 (three) times daily. Squeeze contents of one capsule uner tongue 15-30 minutes before meals 3 times a day   nitroGLYCERIN 0.4 MG SL tablet Commonly known as:  NITROSTAT Place 0.4 mg under the tongue every 5 (five) minutes as needed for chest pain.   omeprazole 20 MG capsule Commonly known as:  PRILOSEC Take 20 mg by mouth at bedtime.   oxymetazoline 0.05 % nasal spray Commonly known as:  AFRIN Place 1 spray into both nostrils. Two sprays in both nostrils two times daily   PRESERVISION AREDS 2 Caps Take 1 capsule by mouth 2 (two) times daily.   Rivaroxaban 15 MG Tabs tablet Commonly known as:  XARELTO Take 15 mg by mouth daily with supper.   sertraline 25 MG tablet Commonly known as:  ZOLOFT Take 25 mg by mouth at bedtime.   sodium chloride 0.65 % Soln nasal spray Commonly known as:  OCEAN Place 2 sprays into both nostrils 3 (three) times daily.       Review of Systems    Constitutional: Negative for chills, diaphoresis and fever.  HENT: Positive for hearing loss and nosebleeds (right nasal septum). Negative for congestion, ear discharge, ear pain, sore throat and tinnitus.   Eyes: Negative for photophobia, pain, discharge and redness.  Respiratory: Positive for shortness of breath (Morning are worse, but she remains dyspneic throughout the day.). Negative for cough, wheezing and stridor.   Cardiovascular: Negative for chest pain, palpitations and leg swelling.       Hx of Left DVT  Gastrointestinal: Negative for abdominal pain,  blood in stool, constipation, diarrhea, nausea and vomiting.  Endocrine: Negative for polydipsia.  Genitourinary: Positive for frequency. Negative for dysuria, flank pain, hematuria and urgency.  Musculoskeletal: Positive for back pain and gait problem. Negative for myalgias and neck pain.       Lower extremity weakness. [place] wheelchair.  Skin: Negative for rash.  Allergic/Immunologic: Negative for environmental allergies.  Neurological: Negative for dizziness, tremors, seizures, weakness and headaches.  Hematological: Negative.  Does not bruise/bleed easily.  Psychiatric/Behavioral: Positive for confusion. Negative for hallucinations and suicidal ideas. The patient is nervous/anxious.     Immunization History  Administered Date(s) Administered  . Influenza Split 11/16/2010, 12/13/2011  . Influenza Whole 11/27/2009  . Influenza-Unspecified 12/31/2013, 11/18/2014, 12/14/2015  . PPD Test 09/11/2012  . Pneumococcal Polysaccharide-23 02/28/1988   Pertinent  Health Maintenance Due  Topic Date Due  . PNA vac Low Risk Adult (2 of 2 - PCV13) 02/27/1989  . INFLUENZA VACCINE  Completed  . DEXA SCAN  Completed   Fall Risk  10/04/2015 12/28/2014 10/23/2014 11/24/2013  Falls in the past year? No Yes Yes Yes  Number falls in past yr: - 1 1 2  or more  Injury with Fall? - No No -  Risk for fall due to : Impaired mobility;Impaired  balance/gait History of fall(s);Impaired balance/gait;Impaired mobility Impaired mobility History of fall(s);Impaired balance/gait;Impaired vision;Impaired mobility  Follow up - Falls prevention discussed Falls evaluation completed -     Vitals:   02/17/16 1043  BP: 110/64  Pulse: 69  Resp: 18  Temp: 98.3 F (36.8 C)  SpO2: 97%  Weight: 109 lb (49.4 kg)  Height: 5\' 1"  (1.549 m)   Body mass index is 20.6 kg/m. Physical Exam  Constitutional: She is oriented to person, place, and time. She appears well-developed and well-nourished. No distress.  HENT:  Head: Normocephalic and atraumatic.  Hx of right nasal septal bleeding at Kiesselbach's plexus. Scarring fom cautery in this area.  Eyes: Conjunctivae and EOM are normal. Pupils are equal, round, and reactive to light.  Neck: Normal range of motion. Neck supple. No JVD present. No thyromegaly present.  Cardiovascular: Normal rate, regular rhythm and normal heart sounds.   Pulmonary/Chest: Effort normal. She has decreased breath sounds. She has no wheezes. She has no rales.  Decreased breath sounds Bilat. Lungs.   Abdominal: Soft. Bowel sounds are normal. There is no tenderness.  Musculoskeletal: Normal range of motion. She exhibits edema. She exhibits no tenderness.  Lower back-baseline: pain is well controlled with prn inj, narcotics, w/c   Lymphadenopathy:    She has no cervical adenopathy.  Neurological: She is alert and oriented to person, place, and time. She has normal reflexes. No cranial nerve deficit. She exhibits normal muscle tone. Coordination normal.  8/ 26/16 BIMS score 14/15  Skin: Skin is warm and dry. No rash noted. She is not diaphoretic. No erythema (LLE).  The right hip surgical scar  Psychiatric: She has a normal mood and affect. Her affect is not angry, not blunt, not labile and not inappropriate. Her speech is not delayed, not tangential and not slurred. She is hyperactive and slowed. She is not agitated, not  aggressive, not withdrawn and not combative. Thought content is not paranoid and not delusional. Cognition and memory are impaired. She does not express impulsivity or inappropriate judgment. She exhibits abnormal recent memory.    Labs reviewed:  Recent Labs  05/04/15 01/21/16 02/10/16 1530  NA 140 138 139  K 4.5 4.5 4.3  CL  --   --  101  GLUCOSE  --   --  113*  BUN 22* 25* 24*  CREATININE 0.9 0.9 0.90    Recent Labs  05/04/15  AST 14  ALT 10  ALKPHOS 56    Recent Labs  10/05/15 12/02/15 01/21/16 02/10/16 1530  WBC 7.3 6.1 9.5  --   HGB 11.8* 12.0 12.4 13.6  HCT 36 37 37 40.0  PLT 271 271 331  --    Lab Results  Component Value Date   TSH 2.89 05/04/2015   Lab Results  Component Value Date   HGBA1C 5.5 10/16/2014   Lab Results  Component Value Date   CHOL 113 02/10/2016   HDL 42 02/10/2016   LDLCALC 43 02/10/2016   LDLDIRECT 152.3 06/04/2009   TRIG 138 02/10/2016   CHOLHDL 3 09/13/2011   Assessment/Plan 1. Epistaxis Stopped Xarelto  2. Vascular dementia without behavioral disturbance Repeat MMSE in  Couple of weeks.  3. Long-term (current) use of anticoagulants For prior DVT. Stopped for 2 weeks.

## 2016-02-24 DIAGNOSIS — D649 Anemia, unspecified: Secondary | ICD-10-CM | POA: Diagnosis not present

## 2016-03-01 ENCOUNTER — Telehealth: Payer: Self-pay | Admitting: *Deleted

## 2016-03-01 NOTE — Telephone Encounter (Signed)
-----   Message from Annia Belt, MD sent at 03/01/2016 11:48 AM EST ----- Thanks. Can you call and advise to stay on same dose of Hydrea please ----- Message ----- From: Ebbie Latus, RN Sent: 03/01/2016   9:47 AM To: Annia Belt, MD  Lab results from [facility] - WBC 6.7; Hgb 10.9, Plt 255 - I will put reults in your mailbox I [facility]. Glenda

## 2016-03-01 NOTE — Telephone Encounter (Signed)
Talked to Vania Rea ,LPN at [facility] - informed received lab result, instructed to continued pt on same dose of Hydrea per Dr Darnell Level. She repeated instruction/voiced understanding.

## 2016-03-06 DIAGNOSIS — D649 Anemia, unspecified: Secondary | ICD-10-CM | POA: Diagnosis not present

## 2016-03-08 ENCOUNTER — Non-Acute Institutional Stay (SKILLED_NURSING_FACILITY): Payer: Medicare Other | Admitting: Nurse Practitioner

## 2016-03-08 ENCOUNTER — Encounter: Payer: Self-pay | Admitting: Nurse Practitioner

## 2016-03-08 DIAGNOSIS — J449 Chronic obstructive pulmonary disease, unspecified: Secondary | ICD-10-CM | POA: Diagnosis not present

## 2016-03-08 DIAGNOSIS — F418 Other specified anxiety disorders: Secondary | ICD-10-CM

## 2016-03-08 DIAGNOSIS — I82402 Acute embolism and thrombosis of unspecified deep veins of left lower extremity: Secondary | ICD-10-CM

## 2016-03-08 DIAGNOSIS — R05 Cough: Secondary | ICD-10-CM | POA: Diagnosis not present

## 2016-03-08 DIAGNOSIS — D473 Essential (hemorrhagic) thrombocythemia: Secondary | ICD-10-CM | POA: Diagnosis not present

## 2016-03-08 DIAGNOSIS — M150 Primary generalized (osteo)arthritis: Secondary | ICD-10-CM

## 2016-03-08 DIAGNOSIS — K220 Achalasia of cardia: Secondary | ICD-10-CM

## 2016-03-08 DIAGNOSIS — F0150 Vascular dementia without behavioral disturbance: Secondary | ICD-10-CM

## 2016-03-08 DIAGNOSIS — I10 Essential (primary) hypertension: Secondary | ICD-10-CM | POA: Diagnosis not present

## 2016-03-08 DIAGNOSIS — F419 Anxiety disorder, unspecified: Secondary | ICD-10-CM

## 2016-03-08 DIAGNOSIS — F32A Depression, unspecified: Secondary | ICD-10-CM

## 2016-03-08 DIAGNOSIS — M159 Polyosteoarthritis, unspecified: Secondary | ICD-10-CM

## 2016-03-08 DIAGNOSIS — F329 Major depressive disorder, single episode, unspecified: Secondary | ICD-10-CM

## 2016-03-08 DIAGNOSIS — M8949 Other hypertrophic osteoarthropathy, multiple sites: Secondary | ICD-10-CM

## 2016-03-08 NOTE — Assessment & Plan Note (Signed)
Controlled, continue Metoprolol 25mg bid 

## 2016-03-08 NOTE — Assessment & Plan Note (Signed)
09/18/13 Nifedipine 10mg  tid ac meals. 11/17/14 [facility] f/u as neeed Stable. Continue Omeprazole, Nifedipine.

## 2016-03-08 NOTE — Assessment & Plan Note (Signed)
Long standing spondylosis, prn inj and Norco 10/325 qid for pain control, w/c for mobility except a few steps with walker sometimes 

## 2016-03-08 NOTE — Progress Notes (Signed)
Location:  [facility] Room Number: 35 Place of Service:  SNF (31) Provider:  [name], Manxie  NP  Jeanmarie Hubert, MD  Patient Care Team: Estill Dooms, MD as PCP - General (Internal Medicine) Estill Dooms, MD as PCP - Internal Medicine (Internal Medicine) Annia Belt, MD as Consulting Physician (Hematology and Oncology) Suella Broad, MD as Consulting Physician (Physical Medicine and Rehabilitation) Noralee Space, MD (Pulmonary Disease) [facility] Otho Darner, NP as Nurse Practitioner (Nurse Practitioner) Inda Castle, MD as Consulting Physician (Gastroenterology)  Extended Emergency Contact Information Primary Emergency Contact: Dyck,Gene Address: [address]          [place], [place] [address] [place] Phone: [phone] Work Phone: [phone] Mobile Phone: [phone] Relation: Son Secondary Emergency Contact: [name], [place] Phone: [phone] Relation: Son  Code Status:  DNR Goals of care: Advanced Directive information Advanced Directives 03/08/2016  Does Patient Have a Medical Advance Directive? Yes  Type of Advance Directive Living will;Out of facility DNR (pink MOST or yellow form);Healthcare Power of Attorney  Does patient want to make changes to medical advance directive? No - Patient declined  Copy of [place] in Chart? Yes  Pre-existing out of facility DNR order (yellow form or pink MOST form) Yellow form placed in chart (order not valid for inpatient use)     Chief Complaint  Patient presents with  . Acute Visit    cough, congestion, feeling bad    HPI:  Pt is a 83 y.o. female seen today for an acute visit for congestive cough, wheezing, denied chest pain, no O2 desaturation, she is afebrile. Taking Tamiflu. Pending CXR  Hx of dementia, has been stable on Namenda in SNF. GERD has been stable, hx of  dilatations for achalasia, treated for candidiasis, f/u GI prn. Blood pressure has been controlled on Metoprolol. Hx of LLE DVT related to elevated Plt , on Eliquis chronically. Last exacerbation of COPD was 07/2014, otherwise she is stable with occasional chronic hacking cough.    Past Medical History:  Diagnosis Date  . Abnormal weight gain 05/09/2012  . Achalasia of esophagus 09/18/2013   09/18/13 Nifedipine 10mg  tid ac meals. 11/17/14 [facility] f/u as neeed   . Anxiety and depression 03/12/2012   Increased Sertraline to 50mg  daily 07/16/13 08/26/13 Mirtazapine 7.5mg  qhs  10/12/14 Pharm decreased Sertraline to 25mg .  05/04/15 TSH 2.89 09/15/15 pharm taper off Sertraline.  10/08/15 continue Sertraline 25mg  daily, POA desires. 12/02/15 wbc 6.1, Hgb 12.0, plt 271    . Asthmatic bronchitis   . BACK PAIN, LUMBAR 03/12/2007   Hx of lower back pain, had X-ray of her lumbar spine 09/08/12--spondylosis-better controlled with Hydrocodone/ApAp  10/325mg  ac and hs  01/01/13 lumbar spine inj. Able to ambulate with walker on unit until her fall 06/22/13. 06/25/13 unremarkable. X-ray lumbar spine. 11/10/13 dc Oxycodone prn-not used in 60 days.     . COLONIC POLYPS 03/12/2007   Initially noted April 2002 colonoscopy. Followup colonoscopy 05/31/2006 did not disclose any further polyps.    Marland Kitchen COPD (chronic obstructive pulmonary disease) ([place])   . Coronary artery disease   . Deep vein thrombosis of left lower extremity ([place]) 07/19/2012  . Dementia 09/16/2012   05/04/15 TSH 2.89   . Depression   . Diverticulosis 05/10/2012  . DJD (degenerative joint disease)   . Dry skin  dermatitis 11/18/2012  . Edema of leg 07/01/2010  . Epistaxis 01/24/2016   01/21/16: x2, held Xarelto x 1 day, Saline Nasal spray prn, wbc 9.5, Hgb 12.4, Plt 331, Na 138, K 4.5, Bun 25, creat 0.89 01/22/16 [id number]  . Esophageal dysmotility 07/26/2013  . Esophageal dysphagia 05/27/2010   New dx of achalasia 6/ 2015 09/18/13 Nifedipine 10mg  tid ac meals.    .  Esophageal stricture   . Essential hypertension, benign 10/30/2013   12/02/15 wbc 6.1, Hgb 12.0, plt 271   . Essential thrombocythemia ([place]) 06/09/2009  . Fall 06/25/2013  . GERD (gastroesophageal reflux disease)   . Hip fracture ([place]) 09/08/2012  . HYPERCHOLESTEROLEMIA 03/12/2007   08/10/15 cholesterol 119, triglycerides 143, HDL 51, LDL 39   . Lumbar back pain   . MACULAR DEGENERATION 01/26/2008   Qualifier: Diagnosis of  By: Lenna [place] MD, Deborra Medina   . Osteoarthritis 03/12/2007  . Osteoporosis 03/12/2007   Qualifier: Diagnosis of  By: Julien Girt CMA, Marliss Czar  06/15/14 dc Vit D 1000u po daily per pharm.    . Phlebitis and thrombophlebitis of other deep vessels of lower extremities 07/10/2010  . S/P balloon dilatation of esophageal stricture 07/28/2013   07/26/13. Dr. Henrene Pastor. EGD foreign removal and Balloon dilation of esophagus. Esophageal dysmotility, suspected achalasia, dilation at the GE junction to 61mm. Candida esophagitis.    . SYNCOPE 07/30/2009   Occurred 2011. Hospitalized. Felt due to dehydration.    . TRANSIENT ISCHEMIC ATTACKS, HX OF 03/12/2007   Qualifier: Diagnosis of  By: Julien Girt CMA, Marliss Czar     Past Surgical History:  Procedure Laterality Date  . ABDOMINAL HYSTERECTOMY    . APPENDECTOMY    . BOTOX INJECTION N/A 09/18/2013   Procedure: BOTOX INJECTION;  Surgeon: Lafayette Dragon, MD;  Location: [facility];  Service: Endoscopy;  Laterality: N/A;  . BOTOX INJECTION N/A 10/16/2014   Procedure: BOTOX INJECTION;  Surgeon: Inda Castle, MD;  Location: [facility];  Service: Endoscopy;  Laterality: N/A;  . CATARACT EXTRACTION W/ INTRAOCULAR LENS  IMPLANT, BILATERAL    . CHOLECYSTECTOMY    . ESOPHAGEAL MANOMETRY N/A 08/25/2013   Procedure: ESOPHAGEAL MANOMETRY (EM);  Surgeon: Jerene Bears, MD;  Location: [facility];  Service: Gastroenterology;  Laterality: N/A;  . ESOPHAGOGASTRODUODENOSCOPY N/A 07/26/2013   Procedure: ESOPHAGOGASTRODUODENOSCOPY (EGD);  Surgeon: Irene Shipper, MD;  Location: [facility]  ENDOSCOPY;  Service: Endoscopy;  Laterality: N/A;  . ESOPHAGOGASTRODUODENOSCOPY N/A 10/16/2014   Procedure: ESOPHAGOGASTRODUODENOSCOPY (EGD);  Surgeon: Inda Castle, MD;  Location: [facility];  Service: Endoscopy;  Laterality: N/A;  with botox  . ESOPHAGOGASTRODUODENOSCOPY (EGD) WITH ESOPHAGEAL DILATION N/A 11/25/2012   Procedure: ESOPHAGOGASTRODUODENOSCOPY (EGD) WITH ESOPHAGEAL DILATION;  Surgeon: Jerene Bears, MD;  Location: [facility];  Service: Gastroenterology;  Laterality: N/A;  . ESOPHAGOGASTRODUODENOSCOPY (EGD) WITH PROPOFOL N/A 09/18/2013   Procedure: ESOPHAGOGASTRODUODENOSCOPY (EGD) WITH PROPOFOL;  Surgeon: Lafayette Dragon, MD;  Location: [facility];  Service: Endoscopy;  Laterality: N/A;  . HIP ARTHROPLASTY Right 09/09/2012   Procedure: ARTHROPLASTY BIPOLAR HIP;  Surgeon: Mauri Pole, MD;  Location: [facility];  Service: Orthopedics;  Laterality: Right;  . TAH and BSO  08/21/1995    Allergies  Allergen Reactions  . Sulfamethoxazole Rash    Allergies as of 03/08/2016      Reactions   Sulfamethoxazole Rash      Medication List       Accurate as of 03/08/16  3:00 PM. Always use your most recent med list.  acetaminophen 325 MG tablet Commonly known as:  TYLENOL Take 650 mg by mouth every 4 (four) hours as needed.   albuterol 108 (90 Base) MCG/ACT inhaler Commonly known as:  PROVENTIL HFA;VENTOLIN HFA Inhale 2 puffs into the lungs every 6 (six) hours as needed for wheezing or shortness of breath.   atorvastatin 20 MG tablet Commonly known as:  LIPITOR Take 20 mg by mouth every evening.   BREO ELLIPTA 100-25 MCG/INH Aepb Generic drug:  fluticasone furoate-vilanterol Inhale 1 puff into the lungs daily. One puff inhale once a day, rinse mouth after each use.   CHLORHEXIDINE GLUCONATE (BULK) Soln Take 5 mLs by mouth daily. 5 ml swish and spit for 30 seconds every day indefinitely   DENTA 5000 PLUS 1.1 % Crea dental cream Generic drug:  sodium fluoride Place  1 application onto teeth at bedtime.   feeding supplement Liqd Take 90 mLs by mouth 2 (two) times daily between meals.   Humidifier Misc Please use humidifier to prevent excessive dryness.   HYDROcodone-acetaminophen 10-325 MG tablet Commonly known as:  NORCO Take 1 tablet by mouth 4 (four) times daily.   hydroxyurea 500 MG capsule Commonly known as:  HYDREA Take 500-1,000 mg by mouth See admin instructions. Takes 1000 mg on Monday only Takes 500 mg on Sunday, Wednesday and Friday only   memantine 10 MG tablet Commonly known as:  NAMENDA Take 10 mg by mouth 2 (two) times daily. Take one tablet twice daily for memory   metoprolol tartrate 25 MG tablet Commonly known as:  LOPRESSOR Take 25 mg by mouth 2 (two) times daily.   montelukast 10 MG tablet Commonly known as:  SINGULAIR Take 10 mg by mouth at bedtime.   NIFEdipine 10 MG capsule Commonly known as:  PROCARDIA Take 10 mg by mouth 3 (three) times daily. Squeeze contents of one capsule uner tongue 15-30 minutes before meals 3 times a day   nitroGLYCERIN 0.4 MG SL tablet Commonly known as:  NITROSTAT Place 0.4 mg under the tongue every 5 (five) minutes as needed for chest pain.   omeprazole 20 MG capsule Commonly known as:  PRILOSEC Take 20 mg by mouth at bedtime.   oseltamivir 75 MG capsule Commonly known as:  TAMIFLU Take 75 mg by mouth daily.   PRESERVISION AREDS 2 Caps Take 1 capsule by mouth 2 (two) times daily.   Rivaroxaban 15 MG Tabs tablet Commonly known as:  XARELTO Take 15 mg by mouth daily with supper.   sertraline 25 MG tablet Commonly known as:  ZOLOFT Take 25 mg by mouth at bedtime.       Review of Systems  Constitutional: Positive for activity change, appetite change and fatigue. Negative for chills, diaphoresis and fever.  HENT: Positive for hearing loss. Negative for congestion, ear discharge, ear pain, nosebleeds (right nasal septum), sore throat and tinnitus.   Eyes: Negative for  photophobia, pain, discharge and redness.  Respiratory: Positive for cough and shortness of breath (Morning are worse, but she remains dyspneic throughout the day.). Negative for wheezing and stridor.   Cardiovascular: Negative for chest pain, palpitations and leg swelling.       Hx of Left DVT  Gastrointestinal: Negative for abdominal pain, blood in stool, constipation, diarrhea, nausea and vomiting.  Endocrine: Negative for polydipsia.  Genitourinary: Positive for frequency. Negative for dysuria, flank pain, hematuria and urgency.  Musculoskeletal: Positive for back pain and gait problem. Negative for myalgias and neck pain.       Lower  extremity weakness. [place] wheelchair.  Skin: Negative for rash.  Allergic/Immunologic: Negative for environmental allergies.  Neurological: Negative for dizziness, tremors, seizures, weakness and headaches.  Hematological: Negative.  Does not bruise/bleed easily.  Psychiatric/Behavioral: Positive for confusion. Negative for hallucinations and suicidal ideas. The patient is nervous/anxious.     Immunization History  Administered Date(s) Administered  . Influenza Split 11/16/2010, 12/13/2011  . Influenza Whole 11/27/2009  . Influenza-Unspecified 12/31/2013, 11/18/2014, 12/14/2015  . PPD Test 09/11/2012  . Pneumococcal Polysaccharide-23 02/28/1988   Pertinent  Health Maintenance Due  Topic Date Due  . PNA vac Low Risk Adult (2 of 2 - PCV13) 02/27/1989  . INFLUENZA VACCINE  Completed  . DEXA SCAN  Completed   Fall Risk  10/04/2015 12/28/2014 10/23/2014 11/24/2013  Falls in the past year? No Yes Yes Yes  Number falls in past yr: - 1 1 2  or more  Injury with Fall? - No No -  Risk for fall due to : Impaired mobility;Impaired balance/gait History of fall(s);Impaired balance/gait;Impaired mobility Impaired mobility History of fall(s);Impaired balance/gait;Impaired vision;Impaired mobility  Follow up - Falls prevention discussed Falls evaluation completed -    Functional Status Survey:    Vitals:   03/08/16 1341  BP: (!) 157/87  Pulse: 65  Resp: 19  Temp: 98 F (36.7 C)  Weight: 108 lb 11.2 oz (49.3 kg)  Height: 5\' 1"  (1.549 m)   Body mass index is 20.54 kg/m. Physical Exam  Constitutional: She is oriented to person, place, and time. She appears well-developed and well-nourished. No distress.  HENT:  Head: Normocephalic and atraumatic.  Hx of right nasal septal bleeding at Kiesselbach's plexus. Scarring fom cautery in this area.  Eyes: Conjunctivae and EOM are normal. Pupils are equal, round, and reactive to light.  Neck: Normal range of motion. Neck supple. No JVD present. No thyromegaly present.  Cardiovascular: Normal rate, regular rhythm and normal heart sounds.   Pulmonary/Chest: Effort normal. She has decreased breath sounds. She has wheezes. She has no rales.  Decreased breath sounds Bilat. Lungs.   Abdominal: Soft. Bowel sounds are normal. There is no tenderness.  Musculoskeletal: Normal range of motion. She exhibits edema. She exhibits no tenderness.  Lower back-baseline: pain is well controlled with prn inj, narcotics, w/c   Lymphadenopathy:    She has no cervical adenopathy.  Neurological: She is alert and oriented to person, place, and time. She has normal reflexes. No cranial nerve deficit. She exhibits normal muscle tone. Coordination normal.  8/ 26/16 BIMS score 14/15  Skin: Skin is warm and dry. No rash noted. She is not diaphoretic. No erythema (LLE).  The right hip surgical scar  Psychiatric: She has a normal mood and affect. Her affect is not angry, not blunt, not labile and not inappropriate. Her speech is not delayed, not tangential and not slurred. She is hyperactive and slowed. She is not agitated, not aggressive, not withdrawn and not combative. Thought content is not paranoid and not delusional. Cognition and memory are impaired. She does not express impulsivity or inappropriate judgment. She exhibits abnormal  recent memory.    Labs reviewed:  Recent Labs  05/04/15 01/21/16 02/10/16 1530  NA 140 138 139  K 4.5 4.5 4.3  CL  --   --  101  GLUCOSE  --   --  113*  BUN 22* 25* 24*  CREATININE 0.9 0.9 0.90    Recent Labs  05/04/15  AST 14  ALT 10  ALKPHOS 56    Recent Labs  10/05/15 12/02/15 01/21/16 02/10/16 1530  WBC 7.3 6.1 9.5  --   HGB 11.8* 12.0 12.4 13.6  HCT 36 37 37 40.0  PLT 271 271 331  --    Lab Results  Component Value Date   TSH 2.89 05/04/2015   Lab Results  Component Value Date   HGBA1C 5.5 10/16/2014   Lab Results  Component Value Date   CHOL 113 02/10/2016   HDL 42 02/10/2016   LDLCALC 43 02/10/2016   LDLDIRECT 152.3 06/04/2009   TRIG 138 02/10/2016   CHOLHDL 3 09/13/2011    Significant Diagnostic Results in last 30 days:  No results found.  Assessment/Plan COPD with asthma ([place]) dc'd Epipen prn and Immunotherapy per [facility] 01/17/13 --maintenance, takes Montelukast 10mg  daily, Breo Ellipta I daily inhaled, Ventolin HFA 2 puffs q6h prn.  -last flare up 07/2014   congestive cough, wheezing, denied chest pain, no O2 desaturation, she is afebrile. Taking Tamiflu. Pending CXR Duoneb q6h x 3 days, Prednisone 20mg  qd x 4 days, then 10mg  qd x 4 days, then 5mg  qd x 4 days, then dc. Avelox 400mg  qd x 10days.   Essential hypertension, benign Controlled, continue Metoprolol 25mg  bid    Deep vein thrombosis of left lower extremity Switched to Xeralto since 09/12/12. Left leg   Achalasia of esophagus 09/18/13 Nifedipine 10mg  tid ac meals. 11/17/14 [facility] f/u as neeed Stable. Continue Omeprazole, Nifedipine.    Dementia 11/25/14 MMSE 28/30, continue Namenda. 05/04/15 TSH 2.89    Osteoarthritis Long standing spondylosis, prn inj and Norco 10/325 qid for pain control, w/c for mobility except a few steps with walker sometimes.     Essential thrombocythemia [facility]) F/u oncology, continue hydroxyurea 1000mg  q Monday, 500mg  Sun, Wed, Fri.  Last Plt 331 01/21/16   Anxiety and depression Her mood is stable, continue Sertraline 25mg  since 10/12/14(G[facility]), off Mirtazapine. Continue prn Lorazepam. 05/04/15 TSH 2.89      Family/ staff Communication: SNF  Labs/tests ordered:  CXR pending.

## 2016-03-08 NOTE — Assessment & Plan Note (Addendum)
dc'd Epipen prn and Immunotherapy per [facility] 01/17/13 --maintenance, takes Montelukast 10mg  daily, Breo Ellipta I daily inhaled, Ventolin HFA 2 puffs q6h prn.  -last flare up 07/2014   congestive cough, wheezing, denied chest pain, no O2 desaturation, she is afebrile. Taking Tamiflu. Pending CXR Duoneb q6h x 3 days, Prednisone 20mg  qd x 4 days, then 10mg  qd x 4 days, then 5mg  qd x 4 days, then dc. Avelox 400mg  qd x 10days.

## 2016-03-08 NOTE — Assessment & Plan Note (Signed)
Her mood is stable, continue Sertraline 25mg  since 10/12/14(GDR), off Mirtazapine. Continue prn Lorazepam. 05/04/15 TSH 2.89

## 2016-03-08 NOTE — Assessment & Plan Note (Signed)
Switched to Xeralto since 09/12/12. Left leg 

## 2016-03-08 NOTE — Assessment & Plan Note (Signed)
F/u oncology, continue hydroxyurea 1000mg  q Monday, 500mg  Sun, Wed, Fri. Last Plt 331 01/21/16

## 2016-03-08 NOTE — Assessment & Plan Note (Signed)
11/25/14 MMSE 28/30, continue Namenda. 05/04/15 TSH 2.89

## 2016-03-09 DIAGNOSIS — E119 Type 2 diabetes mellitus without complications: Secondary | ICD-10-CM | POA: Diagnosis not present

## 2016-03-09 LAB — HEPATIC FUNCTION PANEL
ALK PHOS: 68 U/L (ref 25–125)
ALT: 10 U/L (ref 7–35)
AST: 17 U/L (ref 13–35)
Bilirubin, Total: 0.5 mg/dL

## 2016-03-09 LAB — BASIC METABOLIC PANEL
BUN: 17 mg/dL (ref 4–21)
CREATININE: 0.7 mg/dL (ref <=1.1)
GLUCOSE: 122 mg/dL
POTASSIUM: 3.9 mmol/L (ref 3.4–5.3)
SODIUM: 143 mmol/L (ref 137–147)

## 2016-03-09 LAB — CBC AND DIFFERENTIAL
HEMATOCRIT: 36 % (ref 36–46)
Hemoglobin: 11.9 g/dL — AB (ref 12.0–16.0)
Platelets: 245 10*3/uL (ref 150–399)
WBC: 4.8 10^3/mL

## 2016-03-13 ENCOUNTER — Other Ambulatory Visit: Payer: Self-pay | Admitting: *Deleted

## 2016-04-05 ENCOUNTER — Encounter: Payer: Self-pay | Admitting: Nurse Practitioner

## 2016-04-05 ENCOUNTER — Non-Acute Institutional Stay (SKILLED_NURSING_FACILITY): Payer: Medicare Other | Admitting: Nurse Practitioner

## 2016-04-05 DIAGNOSIS — I10 Essential (primary) hypertension: Secondary | ICD-10-CM

## 2016-04-05 DIAGNOSIS — F419 Anxiety disorder, unspecified: Secondary | ICD-10-CM

## 2016-04-05 DIAGNOSIS — F418 Other specified anxiety disorders: Secondary | ICD-10-CM

## 2016-04-05 DIAGNOSIS — J449 Chronic obstructive pulmonary disease, unspecified: Secondary | ICD-10-CM | POA: Diagnosis not present

## 2016-04-05 DIAGNOSIS — M8949 Other hypertrophic osteoarthropathy, multiple sites: Secondary | ICD-10-CM

## 2016-04-05 DIAGNOSIS — F329 Major depressive disorder, single episode, unspecified: Secondary | ICD-10-CM

## 2016-04-05 DIAGNOSIS — M150 Primary generalized (osteo)arthritis: Secondary | ICD-10-CM

## 2016-04-05 DIAGNOSIS — K220 Achalasia of cardia: Secondary | ICD-10-CM

## 2016-04-05 DIAGNOSIS — D473 Essential (hemorrhagic) thrombocythemia: Secondary | ICD-10-CM

## 2016-04-05 DIAGNOSIS — F32A Depression, unspecified: Secondary | ICD-10-CM

## 2016-04-05 DIAGNOSIS — F0150 Vascular dementia without behavioral disturbance: Secondary | ICD-10-CM

## 2016-04-05 DIAGNOSIS — I82402 Acute embolism and thrombosis of unspecified deep veins of left lower extremity: Secondary | ICD-10-CM

## 2016-04-05 DIAGNOSIS — M159 Polyosteoarthritis, unspecified: Secondary | ICD-10-CM

## 2016-04-05 NOTE — Assessment & Plan Note (Signed)
09/18/13 Nifedipine 10mg  tid ac meals. 11/17/14 [facility] f/u as neeed Stable. Continue Omeprazole, Nifedipine.

## 2016-04-05 NOTE — Assessment & Plan Note (Signed)
Her mood is stable, continue Sertraline 25mg since 10/12/14([facility]DR)  

## 2016-04-05 NOTE — Assessment & Plan Note (Addendum)
Controlled, continue Metoprolol 25mg  bid, 03/09/16 wbc 4.8, Hgb 11.9, plt 245, Na 143, K 3.9, bun 17, creat 0.65

## 2016-04-05 NOTE — Assessment & Plan Note (Signed)
11/25/14 MMSE 28/30, continue Namenda. 05/04/15 TSH 2.89

## 2016-04-05 NOTE — Assessment & Plan Note (Signed)
maintenance, takes Montelukast 10mg daily, Breo Ellipta I daily inhaled, Ventolin HFA 2 puffs q6h prn.  

## 2016-04-05 NOTE — Assessment & Plan Note (Signed)
F/u oncology, continue hydroxyurea 1000mg  q Monday, 500mg  Sun, Wed, Fri. Last Plt 245 03/09/16

## 2016-04-05 NOTE — Assessment & Plan Note (Signed)
Switched to Xeralto since 09/12/12. Left leg 

## 2016-04-05 NOTE — Progress Notes (Signed)
Location:  [place] Room Number: 35 Place of Service:  SNF (31) Provider:  Oluwanifemi Petitti, [place]xie  NP  Jeanmarie Hubert, MD  Patient Care Team: Estill Dooms, MD as PCP - General (Internal Medicine) Estill Dooms, MD as PCP - Internal Medicine (Internal Medicine) Annia Belt, MD as Consulting Physician (Hematology and Oncology) Suella Broad, MD as Consulting Physician (Physical Medicine and Rehabilitation) Noralee Space, MD (Pulmonary Disease) [facility] Anum Palecek Otho Darner, NP as Nurse Practitioner (Nurse Practitioner) Inda Castle, MD as Consulting Physician (Gastroenterology)  Extended Emergency Contact Information Primary Emergency Contact: Cruise,Gene Address: [address]          [place], [place] [address] [place] Phone: [phone] Work Phone: [phone] Mobile Phone: [phone] Relation: Son Secondary Emergency Contact: [name], [place] Phone: [phone] Relation: Son  Code Status:  DNR Goals of care: Advanced Directive information Advanced Directives 04/05/2016  Does Patient Have a Medical Advance Directive? Yes  Type of Advance Directive Living will;Out of facility DNR (pink MOST or yellow form);Healthcare Power of Attorney  Does patient want to make changes to medical advance directive? No - Patient declined  Copy of [place] in Chart? Yes  Pre-existing out of facility DNR order (yellow form or pink MOST form) Yellow form placed in chart (order not valid for inpatient use)     Chief Complaint  Patient presents with  . Medical Management of Chronic Issues    HPI:  Pt is a 83 y.o. female seen today for medical management of chronic diseases.    Hx of dementia, has been stable on Namenda in SNF. GERD has been stable, hx of dilatations for achalasia, treated for candidiasis, takes NIfedi[ine 10mg  tid,  f/u GI prn. Blood  pressure has been controlled on Metoprolol. Hx of LLE DVT related to elevated Plt , on Eliquis chronically. Hx of COPD, taking Breo Ellipta, Singulair daily, Albuterol HFA q6h prn,  she is stable with occasional chronic hacking cough. Her mood is stable on Sertraline 25mg .   Past Medical History:  Diagnosis Date  . Abnormal weight gain 05/09/2012  . Achalasia of esophagus 09/18/2013   09/18/13 Nifedipine 10mg  tid ac meals. 11/17/14 [facility] f/u as neeed   . Anxiety and depression 03/12/2012   Increased Sertraline to 50mg  daily 07/16/13 08/26/13 Mirtazapine 7.5mg  qhs  10/12/14 Pharm decreased Sertraline to 25mg .  05/04/15 TSH 2.89 09/15/15 pharm taper off Sertraline.  10/08/15 continue Sertraline 25mg  daily, POA desires. 12/02/15 wbc 6.1, Hgb 12.0, plt 271    . Asthmatic bronchitis   . BACK PAIN, LUMBAR 03/12/2007   Hx of lower back pain, had X-ray of her lumbar spine 09/08/12--spondylosis-better controlled with Hydrocodone/ApAp  10/325mg  ac and hs  01/01/13 lumbar spine inj. Able to ambulate with walker on unit until her fall 06/22/13. 06/25/13 unremarkable. X-ray lumbar spine. 11/10/13 dc Oxycodone prn-not used in 60 days.     . COLONIC POLYPS 03/12/2007   Initially noted April 2002 colonoscopy. Followup colonoscopy 05/31/2006 did not disclose any further polyps.    Marland Kitchen COPD (chronic obstructive pulmonary disease) ([place])   . Coronary artery disease   . Deep vein thrombosis of left lower extremity ([place]) 07/19/2012  . Dementia 09/16/2012   05/04/15 TSH 2.89   . Depression   . Diverticulosis 05/10/2012  . DJD (degenerative joint disease)   . Dry skin  dermatitis 11/18/2012  . Edema of leg 07/01/2010  . Epistaxis 01/24/2016   01/21/16: x2, held Xarelto x 1 day, Saline Nasal spray prn, wbc 9.5, Hgb 12.4, Plt 331, Na 138, K 4.5, Bun 25, creat 0.89 01/22/16 [id number])  . Esophageal dysmotility 07/26/2013  . Esophageal dysphagia 05/27/2010   New dx of achalasia 6/ 2015 09/18/13 Nifedipine 10mg  tid ac meals.    . Esophageal  stricture   . Essential hypertension, benign 10/30/2013   12/02/15 wbc 6.1, Hgb 12.0, plt 271   . Essential thrombocythemia ([place]) 06/09/2009  . Fall 06/25/2013  . GERD (gastroesophageal reflux disease)   . Hip fracture ([place]) 09/08/2012  . HYPERCHOLESTEROLEMIA 03/12/2007   08/10/15 cholesterol 119, triglycerides 143, HDL 51, LDL 39   . Lumbar back pain   . MACULAR DEGENERATION 01/26/2008   Qualifier: Diagnosis of  By: Lenna Gilford MD, Deborra Medina   . Osteoarthritis 03/12/2007  . Osteoporosis 03/12/2007   Qualifier: Diagnosis of  By: Julien Girt CMA, Marliss Czar  06/15/14 dc Vit D 1000u po daily per pharm.    . Phlebitis and thrombophlebitis of other deep vessels of lower extremities 07/10/2010  . S/P balloon dilatation of esophageal stricture 07/28/2013   07/26/13. Dr. Henrene Pastor. EGD foreign removal and Balloon dilation of esophagus. Esophageal dysmotility, suspected achalasia, dilation at the GE junction to 21mm. Candida esophagitis.    . SYNCOPE 07/30/2009   Occurred 2011. Hospitalized. Felt due to dehydration.    . TRANSIENT ISCHEMIC ATTACKS, HX OF 03/12/2007   Qualifier: Diagnosis of  By: Julien Girt CMA, Marliss Czar     Past Surgical History:  Procedure Laterality Date  . ABDOMINAL HYSTERECTOMY    . APPENDECTOMY    . BOTOX INJECTION N/A 09/18/2013   Procedure: BOTOX INJECTION;  Surgeon: Lafayette Dragon, MD;  Location: [facility] ENDOSCOPY;  Service: Endoscopy;  Laterality: N/A;  . BOTOX INJECTION N/A 10/16/2014   Procedure: BOTOX INJECTION;  Surgeon: Inda Castle, MD;  Location: [facility]OSCOPY;  Service: Endoscopy;  Laterality: N/A;  . CATARACT EXTRACTION W/ INTRAOCULAR LENS  IMPLANT, BILATERAL    . CHOLECYSTECTOMY    . ESOPHAGEAL MANOMETRY N/A 08/25/2013   Procedure: ESOPHAGEAL MANOMETRY (EM);  Surgeon: Jerene Bears, MD;  Location: [facility] ENDOS[facility];  Service: Gastroenterology;  Laterality: N/A;  . ESOPHAGOGASTRODUODENOSCOPY N/A 07/26/2013   Procedure: ESOPHAGOGASTRODUODENOSCOPY (EGD);  Surgeon: Irene Shipper, MD;  Location: [facility] ENDOSCOPY;  Service:  Endoscopy;  Laterality: N/A;  . ESOPHAGOGASTRODUODENOSCOPY N/A 10/16/2014   Procedure: ESOPHAGOGASTRODUODENOSCOPY (EGD);  Surgeon: Inda Castle, MD;  Location: [facility];  Service: Endoscopy;  Laterality: N/A;  with botox  . ESOPHAGOGASTRODUODENOSCOPY (EGD) WITH ESOPHAGEAL DILATION N/A 11/25/2012   Procedure: ESOPHAGOGASTRODUODENOSCOPY (EGD) WITH ESOPHAGEAL DILATION;  Surgeon: Jerene Bears, MD;  Location: [facility];  Service: Gastroenterology;  Laterality: N/A;  . ESOPHAGOGASTRODUODENOSCOPY (EGD) WITH PROPOFOL N/A 09/18/2013   Procedure: ESOPHAGOGASTRODUODENOSCOPY (EGD) WITH PROPOFOL;  Surgeon: Lafayette Dragon, MD;  Location: [facility] ENDOSCOPY;  Service: Endoscopy;  Laterality: N/A;  . HIP ARTHROPLASTY Right 09/09/2012   Procedure: ARTHROPLASTY BIPOLAR HIP;  Surgeon: Mauri Pole, MD;  Location: [facility]S;  Service: Orthopedics;  Laterality: Right;  . TAH and BSO  08/21/1995    Allergies  Allergen Reactions  . Sulfamethoxazole Rash    Allergies as of 04/05/2016      Reactions   Sulfamethoxazole Rash      Medication List       Accurate as of 04/05/16  3:38 PM. Always use your most recent med list.  acetaminophen 325 MG tablet Commonly known as:  TYLENOL Take 650 mg by mouth every 4 (four) hours as needed.   albuterol 108 (90 Base) MCG/ACT inhaler Commonly known as:  PROVENTIL HFA;VENTOLIN HFA Inhale 2 puffs into the lungs every 6 (six) hours as needed for wheezing or shortness of breath.   atorvastatin 20 MG tablet Commonly known as:  LIPITOR Take 20 mg by mouth every evening.   BREO ELLIPTA 100-25 MCG/INH Aepb Generic drug:  fluticasone furoate-vilanterol Inhale 1 puff into the lungs daily. One puff inhale once a day, rinse mouth after each use.   CHLORHEXIDINE GLUCONATE (BULK) Soln Take 5 mLs by mouth daily. 5 ml swish and spit for 30 seconds every day indefinitely   DENTA 5000 PLUS 1.1 % Crea dental cream Generic drug:  sodium fluoride Place 1 application onto  teeth at bedtime.   feeding supplement Liqd Take 90 mLs by mouth 2 (two) times daily between meals.   Humidifier Misc Please use humidifier to prevent excessive dryness.   HYDROcodone-acetaminophen 10-325 MG tablet Commonly known as:  NORCO Take 1 tablet by mouth 4 (four) times daily.   hydroxyurea 500 MG capsule Commonly known as:  HYDREA Take 500-1,000 mg by mouth See admin instructions. Takes 1000 mg on Monday only Takes 500 mg on Sunday, Wednesday and Friday only   memantine 10 MG tablet Commonly known as:  NAMENDA Take 10 mg by mouth 2 (two) times daily. Take one tablet twice daily for memory   metoprolol tartrate 25 MG tablet Commonly known as:  LOPRESSOR Take 25 mg by mouth 2 (two) times daily.   montelukast 10 MG tablet Commonly known as:  SINGULAIR Take 10 mg by mouth at bedtime.   NIFEdipine 10 MG capsule Commonly known as:  PROCARDIA Take 10 mg by mouth 3 (three) times daily. Squeeze contents of one capsule uner tongue 15-30 minutes before meals 3 times a day   nitroGLYCERIN 0.4 MG SL tablet Commonly known as:  NITROSTAT Place 0.4 mg under the tongue every 5 (five) minutes as needed for chest pain.   omeprazole 20 MG capsule Commonly known as:  PRILOSEC Take 20 mg by mouth at bedtime.   PRESERVISION AREDS 2 Caps Take 1 capsule by mouth 2 (two) times daily.   Rivaroxaban 15 MG Tabs tablet Commonly known as:  XARELTO Take 15 mg by mouth daily with supper.   sertraline 25 MG tablet Commonly known as:  ZOLOFT Take 25 mg by mouth at bedtime.       Review of Systems  Constitutional: Positive for fatigue. Negative for activity change, appetite change, chills, diaphoresis and fever.  HENT: Positive for hearing loss. Negative for congestion, ear discharge, ear pain, nosebleeds (right nasal septum), sore throat and tinnitus.   Eyes: Negative for photophobia, pain, discharge and redness.  Respiratory: Positive for cough and shortness of breath (Morning are  worse, but she remains dyspneic throughout the day.). Negative for wheezing and stridor.   Cardiovascular: Negative for chest pain, palpitations and leg swelling.       Hx of Left DVT  Gastrointestinal: Negative for abdominal pain, blood in stool, constipation, diarrhea, nausea and vomiting.  Endocrine: Negative for polydipsia.  Genitourinary: Positive for frequency. Negative for dysuria, flank pain, hematuria and urgency.  Musculoskeletal: Positive for back pain and gait problem. Negative for myalgias and neck pain.       Lower extremity weakness. [place] wheelchair.  Skin: Negative for rash.  Allergic/Immunologic: Negative for environmental allergies.  Neurological: Negative  for dizziness, tremors, seizures, weakness and headaches.  Hematological: Negative.  Does not bruise/bleed easily.  Psychiatric/Behavioral: Positive for confusion. Negative for hallucinations and suicidal ideas. The patient is nervous/anxious.     Immunization History  Administered Date(s) Administered  . Influenza Split 11/16/2010, 12/13/2011  . Influenza Whole 11/27/2009  . Influenza-Unspecified 12/31/2013, 11/18/2014, 12/14/2015  . PPD Test 09/11/2012  . Pneumococcal Polysaccharide-23 02/28/1988   Pertinent  Health Maintenance Due  Topic Date Due  . PNA vac Low Risk Adult (2 of 2 - PCV13) 02/27/1989  . INFLUENZA VACCINE  Completed  . DEXA SCAN  Completed   Fall Risk  10/04/2015 12/28/2014 10/23/2014 11/24/2013  Falls in the past year? No Yes Yes Yes  Number falls in past yr: - 1 1 2  or more  Injury with Fall? - No No -  Risk for fall due to : Impaired mobility;Impaired balance/gait History of fall(s);Impaired balance/gait;Impaired mobility Impaired mobility History of fall(s);Impaired balance/gait;Impaired vision;Impaired mobility  Follow up - Falls prevention discussed Falls evaluation completed -   Functional Status Survey:    Vitals:   04/05/16 1504  BP: 118/60  Pulse: 78  Resp: 18  Temp: 97.9 F  (36.6 C)  Weight: 102 lb 8 oz (46.5 kg)  Height: 5\' 1"  (1.549 m)   Body mass index is 19.37 kg/m. Physical Exam  Constitutional: She is oriented to person, place, and time. She appears well-developed and well-nourished. No distress.  HENT:  Head: Normocephalic and atraumatic.  Hx of right nasal septal bleeding at Kiesselbach's plexus. Scarring fom cautery in this area.  Eyes: Conjunctivae and EOM are normal. Pupils are equal, round, and reactive to light.  Neck: Normal range of motion. Neck supple. No JVD present. No thyromegaly present.  Cardiovascular: Normal rate, regular rhythm and normal heart sounds.   Pulmonary/Chest: Effort normal. She has decreased breath sounds. She has no rales.  Decreased breath sounds Bilat. Lungs.   Abdominal: Soft. Bowel sounds are normal. There is no tenderness.  Musculoskeletal: Normal range of motion. She exhibits edema. She exhibits no tenderness.  Lower back-baseline: pain is well controlled with prn inj, narcotics, w/c   Lymphadenopathy:    She has no cervical adenopathy.  Neurological: She is alert and oriented to person, place, and time. She has normal reflexes. No cranial nerve deficit. She exhibits normal muscle tone. Coordination normal.  8/ 26/16 BIMS score 14/15  Skin: Skin is warm and dry. No rash noted. She is not diaphoretic. No erythema (LLE).  The right hip surgical scar  Psychiatric: She has a normal mood and affect. Her affect is not angry, not blunt, not labile and not inappropriate. Her speech is not delayed, not tangential and not slurred. She is hyperactive and slowed. She is not agitated, not aggressive, not withdrawn and not combative. Thought content is not paranoid and not delusional. Cognition and memory are impaired. She does not express impulsivity or inappropriate judgment. She exhibits abnormal recent memory.    Labs reviewed:  Recent Labs  01/21/16 02/10/16 1530 03/09/16  NA 138 139 143  K 4.5 4.3 3.9  CL  --  101   --   GLUCOSE  --  113*  --   BUN 25* 24* 17  CREATININE 0.9 0.90 0.7    Recent Labs  05/04/15 03/09/16  AST 14 17  ALT 10 10  ALKPHOS 56 68    Recent Labs  12/02/15 01/21/16 02/10/16 1530 03/09/16  WBC 6.1 9.5  --  4.8  HGB 12.0 12.4  13.6 11.9*  HCT 37 37 40.0 36  PLT 271 331  --  245   Lab Results  Component Value Date   TSH 2.89 05/04/2015   Lab Results  Component Value Date   HGBA1C 5.5 10/16/2014   Lab Results  Component Value Date   CHOL 113 02/10/2016   HDL 42 02/10/2016   LDLCALC 43 02/10/2016   LDLDIRECT 152.3 06/04/2009   TRIG 138 02/10/2016   CHOLHDL 3 09/13/2011    Significant Diagnostic Results in last 30 days:  No results found.  Assessment/Plan Essential hypertension, benign Controlled, continue Metoprolol 25mg  bid, 03/09/16 wbc 4.8, Hgb 11.9, plt 245, Na 143, K 3.9, bun 17, creat 0.65     Deep vein thrombosis of left lower extremity Switched to [place] since 09/12/12. Left leg  COPD with asthma ([place]) maintenance, takes Montelukast 10mg  daily, Breo Ellipta I daily inhaled, Ventolin HFA 2 puffs q6h prn.   Achalasia of esophagus 09/18/13 Nifedipine 10mg  tid ac meals. 11/17/14 [facility] f/u as neeed Stable. Continue Omeprazole, Nifedipine.    Dementia 11/25/14 MMSE 28/30, continue Namenda. 05/04/15 TSH 2.89    Osteoarthritis Long standing spondylosis, prn inj and Norco 10/325 qid for pain control, w/c for mobility except a few steps with walker sometimes.    Essential thrombocythemia [facility]) F/u oncology, continue hydroxyurea 1000mg  q Monday, 500mg  Sun, Wed, Fri. Last Plt 245 03/09/16  Anxiety and depression Her mood is stable, continue Sertraline 25mg  since 10/12/14([facility])     Family/ staff Communication: SNF  Labs/tests ordered: none

## 2016-04-05 NOTE — Assessment & Plan Note (Signed)
Long standing spondylosis, prn inj and Norco 10/325 qid for pain control, w/c for mobility except a few steps with walker sometimes 

## 2016-05-01 ENCOUNTER — Non-Acute Institutional Stay (SKILLED_NURSING_FACILITY): Payer: Medicare Other | Admitting: Nurse Practitioner

## 2016-05-01 ENCOUNTER — Encounter: Payer: Self-pay | Admitting: Nurse Practitioner

## 2016-05-01 DIAGNOSIS — F329 Major depressive disorder, single episode, unspecified: Secondary | ICD-10-CM

## 2016-05-01 DIAGNOSIS — M150 Primary generalized (osteo)arthritis: Secondary | ICD-10-CM

## 2016-05-01 DIAGNOSIS — M47816 Spondylosis without myelopathy or radiculopathy, lumbar region: Secondary | ICD-10-CM | POA: Diagnosis not present

## 2016-05-01 DIAGNOSIS — F418 Other specified anxiety disorders: Secondary | ICD-10-CM | POA: Diagnosis not present

## 2016-05-01 DIAGNOSIS — F0150 Vascular dementia without behavioral disturbance: Secondary | ICD-10-CM | POA: Diagnosis not present

## 2016-05-01 DIAGNOSIS — I82402 Acute embolism and thrombosis of unspecified deep veins of left lower extremity: Secondary | ICD-10-CM | POA: Diagnosis not present

## 2016-05-01 DIAGNOSIS — K224 Dyskinesia of esophagus: Secondary | ICD-10-CM | POA: Diagnosis not present

## 2016-05-01 DIAGNOSIS — I10 Essential (primary) hypertension: Secondary | ICD-10-CM

## 2016-05-01 DIAGNOSIS — D473 Essential (hemorrhagic) thrombocythemia: Secondary | ICD-10-CM | POA: Diagnosis not present

## 2016-05-01 DIAGNOSIS — J449 Chronic obstructive pulmonary disease, unspecified: Secondary | ICD-10-CM

## 2016-05-01 DIAGNOSIS — M5136 Other intervertebral disc degeneration, lumbar region: Secondary | ICD-10-CM | POA: Diagnosis not present

## 2016-05-01 DIAGNOSIS — M159 Polyosteoarthritis, unspecified: Secondary | ICD-10-CM

## 2016-05-01 DIAGNOSIS — J4489 Other specified chronic obstructive pulmonary disease: Secondary | ICD-10-CM

## 2016-05-01 DIAGNOSIS — F32A Depression, unspecified: Secondary | ICD-10-CM

## 2016-05-01 DIAGNOSIS — F419 Anxiety disorder, unspecified: Secondary | ICD-10-CM

## 2016-05-01 DIAGNOSIS — M8949 Other hypertrophic osteoarthropathy, multiple sites: Secondary | ICD-10-CM

## 2016-05-01 NOTE — Assessment & Plan Note (Signed)
Switched to Xeralto since 09/12/12. Left leg 

## 2016-05-01 NOTE — Assessment & Plan Note (Signed)
11/25/14 MMSE 28/30, continue Namenda. 05/04/15 TSH 2.89

## 2016-05-01 NOTE — Assessment & Plan Note (Signed)
Long standing spondylosis, prn inj and Norco 10/325 qid for pain control, w/c for mobility except a few steps with walker sometimes 

## 2016-05-01 NOTE — Assessment & Plan Note (Signed)
09/18/13 Nifedipine 10mg  tid ac meals. 11/17/14 [facility] f/u as neeed Stable. Continue Omeprazole, Nifedipine.

## 2016-05-01 NOTE — Progress Notes (Signed)
Location:  [facility] Room Number: 35 Place of Service:  SNF (31) Provider:  Gerik Coberly, Manxie  NP  Jeanmarie Hubert, MD  Patient Care Team: Estill Dooms, MD as PCP - General (Internal Medicine) Estill Dooms, MD as PCP - Internal Medicine (Internal Medicine) Annia Belt, MD as Consulting Physician (Hematology and Oncology) Suella Broad, MD as Consulting Physician (Physical Medicine and Rehabilitation) Noralee Space, MD (Pulmonary Disease) [facility] Damari Suastegui Otho Darner, NP as Nurse Practitioner (Nurse Practitioner) Inda Castle, MD as Consulting Physician (Gastroenterology)  Extended Emergency Contact Information Primary Emergency Contact: Pinkett,Gene Address: [address]          [place], [place] [address] [place] Phone: [phone] Work Phone: [phone] Mobile Phone: [phone] Relation: Son Secondary Emergency Contact: [name], [place] Phone: [phone] Relation: Son  Code Status:   Goals of care: Advanced Directive information Advanced Directives 05/01/2016  Does Patient Have a Medical Advance Directive? Yes  Type of Advance Directive Living will;Out of facility DNR (pink MOST or yellow form);Healthcare Power of Attorney  Does patient want to make changes to medical advance directive? No - Patient declined  Copy of [place] in Chart? Yes  Pre-existing out of facility DNR order (yellow form or pink MOST form) Yellow form placed in chart (order not valid for inpatient use)     Chief Complaint  Patient presents with  . Medical Management of Chronic Issues    HPI:  Pt is a 83 y.o. female seen today for medical management of chronic diseases.     Hx of dementia, has been stable on Namenda in SNF. GERD has been stable, taking Omeprazole,  hx of dilatations for achalasia, treated for candidiasis, takes NIfedi[ine 10mg  tid,  f/u GI  prn. Blood pressure has been controlled on Metoprolol. Hx of LLE DVT related to elevated Plt , on Xarelto chronically. Hx of COPD, taking Breo Ellipta, Singulair daily, Albuterol HFA q6h prn,  she is stable with occasional chronic hacking cough. Her mood is stable on Sertraline 25mg .  Past Medical History:  Diagnosis Date  . Abnormal weight gain 05/09/2012  . Achalasia of esophagus 09/18/2013   09/18/13 Nifedipine 10mg  tid ac meals. 11/17/14 GI [facility] f/u as neeed   . Anxiety and depression 03/12/2012   Increased Sertraline to 50mg  daily 07/16/13 08/26/13 Mirtazapine 7.5mg  qhs  10/12/14 Pharm decreased Sertraline to 25mg .  05/04/15 TSH 2.89 09/15/15 pharm taper off Sertraline.  10/08/15 continue Sertraline 25mg  daily, POA desires. 12/02/15 wbc 6.1, Hgb 12.0, plt 271    . Asthmatic bronchitis   . BACK PAIN, LUMBAR 03/12/2007   Hx of lower back pain, had X-ray of her lumbar spine 09/08/12--spondylosis-better controlled with Hydrocodone/ApAp  10/325mg  ac and hs  01/01/13 lumbar spine inj. Able to ambulate with walker on unit until her fall 06/22/13. 06/25/13 unremarkable. X-ray lumbar spine. 11/10/13 dc Oxycodone prn-not used in 60 days.     . COLONIC POLYPS 03/12/2007   Initially noted April 2002 colonoscopy. Followup colonoscopy 05/31/2006 did not disclose any further polyps.    Marland Kitchen COPD (chronic obstructive pulmonary disease) ([place])   . Coronary artery disease   . Deep vein thrombosis of left lower extremity ([place]) 07/19/2012  . Dementia 09/16/2012   05/04/15 TSH 2.89   . Depression   . Diverticulosis 05/10/2012  . DJD (degenerative joint disease)   .  Dry skin dermatitis 11/18/2012  . Edema of leg 07/01/2010  . Epistaxis 01/24/2016   01/21/16: x2, held Xarelto x 1 day, Saline Nasal spray prn, wbc 9.5, Hgb 12.4, Plt 331, Na 138, K 4.5, Bun 25, creat 0.89 01/22/16 [id number])  . Esophageal dysmotility 07/26/2013  . Esophageal dysphagia 05/27/2010   New dx of achalasia 6/ 2015 09/18/13 Nifedipine 10mg  tid ac meals.    .  Esophageal stricture   . Essential hypertension, benign 10/30/2013   12/02/15 wbc 6.1, Hgb 12.0, plt 271   . Essential thrombocythemia ([place]) 06/09/2009  . Fall 06/25/2013  . GERD (gastroesophageal reflux disease)   . Hip fracture ([place]) 09/08/2012  . HYPERCHOLESTEROLEMIA 03/12/2007   08/10/15 cholesterol 119, triglycerides 143, HDL 51, LDL 39   . Lumbar back pain   . MACULAR DEGENERATION 01/26/2008   Qualifier: Diagnosis of  By: Lenna Gilford MD, Deborra Medina   . Osteoarthritis 03/12/2007  . Osteoporosis 03/12/2007   Qualifier: Diagnosis of  By: Julien Girt CMA, Marliss Czar  06/15/14 dc Vit D 1000u po daily per pharm.    . Phlebitis and thrombophlebitis of other deep vessels of lower extremities 07/10/2010  . S/P balloon dilatation of esophageal stricture 07/28/2013   07/26/13. Dr. Henrene Pastor. EGD foreign removal and Balloon dilation of esophagus. Esophageal dysmotility, suspected achalasia, dilation at the GE junction to 56mm. Candida esophagitis.    . SYNCOPE 07/30/2009   Occurred 2011. Hospitalized. Felt due to dehydration.    . TRANSIENT ISCHEMIC ATTACKS, HX OF 03/12/2007   Qualifier: Diagnosis of  By: Julien Girt CMA, Marliss Czar     Past Surgical History:  Procedure Laterality Date  . ABDOMINAL HYSTERECTOMY    . APPENDECTOMY    . BOTOX INJECTION N/A 09/18/2013   Procedure: BOTOX INJECTION;  Surgeon: Lafayette Dragon, MD;  Location: [facility];  Service: Endoscopy;  Laterality: N/A;  . BOTOX INJECTION N/A 10/16/2014   Procedure: BOTOX INJECTION;  Surgeon: Inda Castle, MD;  Location: [facility];  Service: Endoscopy;  Laterality: N/A;  . CATARACT EXTRACTION W/ INTRAOCULAR LENS  IMPLANT, BILATERAL    . CHOLECYSTECTOMY    . ESOPHAGEAL MANOMETRY N/A 08/25/2013   Procedure: ESOPHAGEAL MANOMETRY (EM);  Surgeon: Jerene Bears, MD;  Location: [facility];  Service: Gastroenterology;  Laterality: N/A;  . ESOPHAGOGASTRODUODENOSCOPY N/A 07/26/2013   Procedure: ESOPHAGOGASTRODUODENOSCOPY (EGD);  Surgeon: Irene Shipper, MD;  Location: [facility]  ENDOSCOPY;  Service: Endoscopy;  Laterality: N/A;  . ESOPHAGOGASTRODUODENOSCOPY N/A 10/16/2014   Procedure: ESOPHAGOGASTRODUODENOSCOPY (EGD);  Surgeon: Inda Castle, MD;  Location: [facility];  Service: Endoscopy;  Laterality: N/A;  with botox  . ESOPHAGOGASTRODUODENOSCOPY (EGD) WITH ESOPHAGEAL DILATION N/A 11/25/2012   Procedure: ESOPHAGOGASTRODUODENOSCOPY (EGD) WITH ESOPHAGEAL DILATION;  Surgeon: Jerene Bears, MD;  Location: [facility];  Service: Gastroenterology;  Laterality: N/A;  . ESOPHAGOGASTRODUODENOSCOPY (EGD) WITH PROPOFOL N/A 09/18/2013   Procedure: ESOPHAGOGASTRODUODENOSCOPY (EGD) WITH PROPOFOL;  Surgeon: Lafayette Dragon, MD;  Location: [facility];  Service: Endoscopy;  Laterality: N/A;  . HIP ARTHROPLASTY Right 09/09/2012   Procedure: ARTHROPLASTY BIPOLAR HIP;  Surgeon: Mauri Pole, MD;  Location: [facility];  Service: Orthopedics;  Laterality: Right;  . TAH and BSO  08/21/1995    Allergies  Allergen Reactions  . Sulfamethoxazole Rash    Allergies as of 05/01/2016      Reactions   Sulfamethoxazole Rash      Medication List       Accurate as of 05/01/16  1:29 PM. Always use your most recent med list.  acetaminophen 325 MG tablet Commonly known as:  TYLENOL Take 650 mg by mouth every 4 (four) hours as needed.   albuterol 108 (90 Base) MCG/ACT inhaler Commonly known as:  PROVENTIL HFA;VENTOLIN HFA Inhale 2 puffs into the lungs every 6 (six) hours as needed for wheezing or shortness of breath.   atorvastatin 20 MG tablet Commonly known as:  LIPITOR Take 20 mg by mouth every evening.   BREO ELLIPTA 100-25 MCG/INH Aepb Generic drug:  fluticasone furoate-vilanterol Inhale 1 puff into the lungs daily. One puff inhale once a day, rinse mouth after each use.   CHLORHEXIDINE GLUCONATE (BULK) Soln Take 5 mLs by mouth daily. 5 ml swish and spit for 30 seconds every day indefinitely   DENTA 5000 PLUS 1.1 % Crea dental cream Generic drug:  sodium fluoride Place 1  application onto teeth at bedtime.   feeding supplement Liqd Take 90 mLs by mouth 2 (two) times daily between meals.   Humidifier Misc Please use humidifier to prevent excessive dryness.   HYDROcodone-acetaminophen 10-325 MG tablet Commonly known as:  NORCO Take 1 tablet by mouth 4 (four) times daily.   hydroxyurea 500 MG capsule Commonly known as:  HYDREA Take 500-1,000 mg by mouth See admin instructions. Takes 1000 mg on Monday only Takes 500 mg on Sunday, Wednesday and Friday only   memantine 10 MG tablet Commonly known as:  NAMENDA Take 10 mg by mouth 2 (two) times daily. Take one tablet twice daily for memory   metoprolol tartrate 25 MG tablet Commonly known as:  LOPRESSOR Take 25 mg by mouth 2 (two) times daily.   montelukast 10 MG tablet Commonly known as:  SINGULAIR Take 10 mg by mouth at bedtime.   NIFEdipine 10 MG capsule Commonly known as:  PROCARDIA Take 10 mg by mouth 3 (three) times daily. Squeeze contents of one capsule uner tongue 15-30 minutes before meals 3 times a day   nitroGLYCERIN 0.4 MG SL tablet Commonly known as:  NITROSTAT Place 0.4 mg under the tongue every 5 (five) minutes as needed for chest pain.   omeprazole 20 MG capsule Commonly known as:  PRILOSEC Take 20 mg by mouth at bedtime.   PRESERVISION AREDS 2 Caps Take 1 capsule by mouth 2 (two) times daily.   Rivaroxaban 15 MG Tabs tablet Commonly known as:  XARELTO Take 15 mg by mouth daily with supper.   sertraline 25 MG tablet Commonly known as:  ZOLOFT Take 25 mg by mouth at bedtime.       Review of Systems  Constitutional: Negative for activity change, appetite change, chills, diaphoresis, fatigue and fever.  HENT: Positive for hearing loss. Negative for congestion, ear discharge, ear pain, nosebleeds (right nasal septum), sore throat and tinnitus.   Eyes: Negative for photophobia, pain, discharge and redness.  Respiratory: Positive for cough. Negative for shortness of breath  (Morning are worse, but she remains dyspneic throughout the day.), wheezing and stridor.   Cardiovascular: Negative for chest pain, palpitations and leg swelling.       Hx of Left DVT  Gastrointestinal: Negative for abdominal pain, blood in stool, constipation, diarrhea, nausea and vomiting.  Endocrine: Negative for polydipsia.  Genitourinary: Positive for frequency. Negative for dysuria, flank pain, hematuria and urgency.  Musculoskeletal: Positive for back pain and gait problem. Negative for myalgias and neck pain.       Lower extremity weakness. Tennant wheelchair.  Skin: Negative for rash.  Allergic/Immunologic: Negative for environmental allergies.  Neurological: Negative for dizziness, tremors,  seizures, weakness and headaches.  Hematological: Negative.  Does not bruise/bleed easily.  Psychiatric/Behavioral: Positive for confusion. Negative for hallucinations and suicidal ideas. The patient is nervous/anxious.     Immunization History  Administered Date(s) Administered  . Influenza Split 11/16/2010, 12/13/2011  . Influenza Whole 11/27/2009  . Influenza-Unspecified 12/31/2013, 11/18/2014, 12/14/2015  . PPD Test 09/11/2012  . Pneumococcal Polysaccharide-23 02/28/1988   Pertinent  Health Maintenance Due  Topic Date Due  . PNA vac Low Risk Adult (2 of 2 - PCV13) 02/27/1989  . INFLUENZA VACCINE  Completed  . DEXA SCAN  Completed   Fall Risk  10/04/2015 12/28/2014 10/23/2014 11/24/2013  Falls in the past year? No Yes Yes Yes  Number falls in past yr: - 1 1 2  or more  Injury with Fall? - No No -  Risk for fall due to : Impaired mobility;Impaired balance/gait History of fall(s);Impaired balance/gait;Impaired mobility Impaired mobility History of fall(s);Impaired balance/gait;Impaired vision;Impaired mobility  Follow up - Falls prevention discussed Falls evaluation completed -   Functional Status Survey:    Vitals:   05/01/16 1149  BP: 113/71  Pulse: 78  Resp: 16  Temp: 98.6 F (37  C)  Weight: 99 lb (44.9 kg)  Height: 5\' 1"  (1.549 m)   Body mass index is 18.71 kg/m. Physical Exam  Constitutional: She is oriented to person, place, and time. She appears well-developed and well-nourished. No distress.  HENT:  Head: Normocephalic and atraumatic.  Hx of right nasal septal bleeding at Kiesselbach's plexus. Scarring fom cautery in this area.  Eyes: Conjunctivae and EOM are normal. Pupils are equal, round, and reactive to light.  Neck: Normal range of motion. Neck supple. No JVD present. No thyromegaly present.  Cardiovascular: Normal rate, regular rhythm and normal heart sounds.   Pulmonary/Chest: Effort normal. She has decreased breath sounds. She has no rales.  Decreased breath sounds Bilat. Lungs.   Abdominal: Soft. Bowel sounds are normal. There is no tenderness.  Musculoskeletal: Normal range of motion. She exhibits edema. She exhibits no tenderness.  Lower back-baseline: pain is well controlled with prn inj, narcotics, w/c   Lymphadenopathy:    She has no cervical adenopathy.  Neurological: She is alert and oriented to person, place, and time. She has normal reflexes. No cranial nerve deficit. She exhibits normal muscle tone. Coordination normal.  8/ 26/16 BIMS score 14/15  Skin: Skin is warm and dry. No rash noted. She is not diaphoretic. No erythema (LLE).  The right hip surgical scar  Psychiatric: She has a normal mood and affect. Her affect is not angry, not blunt, not labile and not inappropriate. Her speech is not delayed, not tangential and not slurred. She is hyperactive and slowed. She is not agitated, not aggressive, not withdrawn and not combative. Thought content is not paranoid and not delusional. Cognition and memory are impaired. She does not express impulsivity or inappropriate judgment. She exhibits abnormal recent memory.    Labs reviewed:  Recent Labs  01/21/16 02/10/16 1530 03/09/16  NA 138 139 143  K 4.5 4.3 3.9  CL  --  101  --     GLUCOSE  --  113*  --   BUN 25* 24* 17  CREATININE 0.9 0.90 0.7    Recent Labs  05/04/15 03/09/16  AST 14 17  ALT 10 10  ALKPHOS 56 68    Recent Labs  12/02/15 01/21/16 02/10/16 1530 03/09/16  WBC 6.1 9.5  --  4.8  HGB 12.0 12.4 13.6 11.9*  HCT  37 37 40.0 36  PLT 271 331  --  245   Lab Results  Component Value Date   TSH 2.89 05/04/2015   Lab Results  Component Value Date   HGBA1C 5.5 10/16/2014   Lab Results  Component Value Date   CHOL 113 02/10/2016   HDL 42 02/10/2016   LDLCALC 43 02/10/2016   LDLDIRECT 152.3 06/04/2009   TRIG 138 02/10/2016   CHOLHDL 3 09/13/2011    Significant Diagnostic Results in last 30 days:  No results found.  Assessment/Plan Osteoarthritis Long standing spondylosis, prn inj and Norco 10/325 qid for pain control, w/c for mobility except a few steps with walker sometimes  Essential thrombocythemia ([place]) F/u oncology, continue hydroxyurea 1000mg  q Monday, 500mg  Sun, Wed, Fri. Last Plt 245 03/09/16   Essential hypertension, benign Controlled, continue Metoprolol 25mg  bid, 03/09/16 wbc 4.8, Hgb 11.9, plt 245, Na 143, K 3.9, bun 17, creat 0.65   Deep vein thrombosis of left lower extremity Switched to [place] since 09/12/12. Left leg  COPD with asthma ([place]) maintenance, takes Montelukast 10mg  daily, Breo Ellipta I daily inhaled, Ventolin HFA 2 puffs q6h prn.    Esophageal dysmotility 09/18/13 Nifedipine 10mg  tid ac meals. 11/17/14 GI [facility] f/u as neeed Stable. Continue Omeprazole, Nifedipine.     Dementia 11/25/14 MMSE 28/30, continue Namenda. 05/04/15 TSH 2.89  Anxiety and depression Her mood is stable, continue Sertraline 25mg  since 10/12/14([facility]DR)      Family/ staff Communication: SNF  Labs/tests ordered:  none

## 2016-05-01 NOTE — Assessment & Plan Note (Signed)
Her mood is stable, continue Sertraline 25mg since 10/12/14([facility]DR)  

## 2016-05-01 NOTE — Assessment & Plan Note (Signed)
F/u oncology, continue hydroxyurea 1000mg  q Monday, 500mg  Sun, Wed, Fri. Last Plt 245 03/09/16

## 2016-05-01 NOTE — Assessment & Plan Note (Signed)
maintenance, takes Montelukast 10mg daily, Breo Ellipta I daily inhaled, Ventolin HFA 2 puffs q6h prn.  

## 2016-05-01 NOTE — Assessment & Plan Note (Signed)
Controlled, continue Metoprolol 25mg  bid, 03/09/16 wbc 4.8, Hgb 11.9, plt 245, Na 143, K 3.9, bun 17, creat 0.65

## 2016-05-02 DIAGNOSIS — D649 Anemia, unspecified: Secondary | ICD-10-CM | POA: Diagnosis not present

## 2016-05-02 LAB — CBC AND DIFFERENTIAL
HCT: 34 % — AB (ref 36–46)
Hemoglobin: 11.1 g/dL — AB (ref 12.0–16.0)
Platelets: 274 10*3/uL (ref 150–399)
WBC: 7.9 10*3/mL

## 2016-05-03 ENCOUNTER — Other Ambulatory Visit: Payer: Self-pay | Admitting: *Deleted

## 2016-05-04 ENCOUNTER — Telehealth: Payer: Self-pay | Admitting: *Deleted

## 2016-05-04 NOTE — Telephone Encounter (Signed)
[place], talked to [facility] - informed "pt's count good-stay on current dose of Hydrea" per Dr Beryle Beams. Voiced understanding.

## 2016-05-09 ENCOUNTER — Telehealth: Payer: Self-pay | Admitting: *Deleted

## 2016-05-09 ENCOUNTER — Ambulatory Visit (INDEPENDENT_AMBULATORY_CARE_PROVIDER_SITE_OTHER): Payer: Medicare Other | Admitting: Nurse Practitioner

## 2016-05-09 ENCOUNTER — Encounter: Payer: Self-pay | Admitting: Nurse Practitioner

## 2016-05-09 VITALS — BP 112/68 | HR 64 | Ht 61.0 in | Wt 100.8 lb

## 2016-05-09 DIAGNOSIS — R634 Abnormal weight loss: Secondary | ICD-10-CM | POA: Diagnosis not present

## 2016-05-09 DIAGNOSIS — K220 Achalasia of cardia: Secondary | ICD-10-CM

## 2016-05-09 DIAGNOSIS — R1310 Dysphagia, unspecified: Secondary | ICD-10-CM | POA: Diagnosis not present

## 2016-05-09 NOTE — Telephone Encounter (Signed)
  05/09/2016   RE: Sherri Crawford DOB: 02/21/1921 MRN: [id number]   Dear  Jonetta Speak   We have scheduled the above patient for an endoscopic procedure. Our records show that she is on anticoagulation therapy.   Please advise as to how long the patient may come off her therapy of Xarelto prior to the procedure, which is scheduled for 05/18/2016.  Please fax back/ or route the completed form to [facility] at [phone].   Sincerely,    Genella Mech ,CMA AAMA

## 2016-05-09 NOTE — Telephone Encounter (Signed)
I would take her off the Xarelto 5 days prior to the procedure. She may resume it 24 hours after the procedure if the is no bleeding

## 2016-05-09 NOTE — Patient Instructions (Addendum)
We will contact you once we discuss with Dr Ardis Hughs about work up for difficulty swallowing.

## 2016-05-09 NOTE — Progress Notes (Signed)
HPI: Patient is a 83 year old female, nursing home resident previously followed previously by Dr. Olevia Perches, then briefly by Dr. Deatra Ina. She has a history COPD and DVT and is on chronic Xarelto. Her GI history is pertinent for achalasia / food impactions and has been treated with dilations as well as botox injections (last one Aug 2016). Patient here with son who provides the history gathered from nursing home staff. Until a couple of months ago patient did very well following last botox injection. Lately she has been having recurrent problems swallowing food and pills. Staff has apparently found that patient has vomited some of her pills recently. Per son, weight was 108 pounds a few months back, she is now 100.8 pounds. Patient doesn't complain to staff nor her son. Per nursing home records patient is still taking TID procardia.    Past Medical History:  Diagnosis Date  . Abnormal weight gain 05/09/2012  . Achalasia of esophagus 09/18/2013   09/18/13 Nifedipine 10mg  tid ac meals. 11/17/14 GI [facility] f/u as neeed   . Anxiety and depression 03/12/2012   Increased Sertraline to 50mg  daily 07/16/13 08/26/13 Mirtazapine 7.5mg  qhs  10/12/14 Pharm decreased Sertraline to 25mg .  05/04/15 TSH 2.89 09/15/15 pharm taper off Sertraline.  10/08/15 continue Sertraline 25mg  daily, POA desires. 12/02/15 wbc 6.1, Hgb 12.0, plt 271    . Asthmatic bronchitis   . BACK PAIN, LUMBAR 03/12/2007   Hx of lower back pain, had X-ray of her lumbar spine 09/08/12--spondylosis-better controlled with Hydrocodone/ApAp  10/325mg  ac and hs  01/01/13 lumbar spine inj. Able to ambulate with walker on unit until her fall 06/22/13. 06/25/13 unremarkable. X-ray lumbar spine. 11/10/13 dc Oxycodone prn-not used in 60 days.     . COLONIC POLYPS 03/12/2007   Initially noted April 2002 colonoscopy. Followup colonoscopy 05/31/2006 did not disclose any further polyps.    Marland Kitchen COPD (chronic obstructive pulmonary disease) ([place])   . Coronary artery disease   . Deep  vein thrombosis of left lower extremity ([place]) 07/19/2012  . Dementia 09/16/2012   05/04/15 TSH 2.89   . Depression   . Diverticulosis 05/10/2012  . DJD (degenerative joint disease)   . Dry skin dermatitis 11/18/2012  . Edema of leg 07/01/2010  . Epistaxis 01/24/2016   01/21/16: x2, held Xarelto x 1 day, Saline Nasal spray prn, wbc 9.5, Hgb 12.4, Plt 331, Na 138, K 4.5, Bun 25, creat 0.89 01/22/16 [id number](19-14)  . Esophageal dysmotility 07/26/2013  . Esophageal dysphagia 05/27/2010   New dx of achalasia 6/ 2015 09/18/13 Nifedipine 10mg  tid ac meals.    . Esophageal stricture   . Essential hypertension, benign 10/30/2013   12/02/15 wbc 6.1, Hgb 12.0, plt 271   . Essential thrombocythemia ([place]) 06/09/2009  . Fall 06/25/2013  . GERD (gastroesophageal reflux disease)   . Hip fracture ([place]) 09/08/2012  . HYPERCHOLESTEROLEMIA 03/12/2007   08/10/15 cholesterol 119, triglycerides 143, HDL 51, LDL 39   . Lumbar back pain   . MACULAR DEGENERATION 01/26/2008   Qualifier: Diagnosis of  By: Lenna Gilford MD, Deborra Medina   . Osteoarthritis 03/12/2007  . Osteoporosis 03/12/2007   Qualifier: Diagnosis of  By: Julien Girt CMA, Marliss Czar  06/15/14 dc Vit D 1000u po daily per pharm.    . Phlebitis and thrombophlebitis of other deep vessels of lower extremities 07/10/2010  . S/P balloon dilatation of esophageal stricture 07/28/2013   07/26/13. Dr. Henrene Pastor. EGD foreign removal and Balloon dilation of esophagus. Esophageal dysmotility, suspected achalasia, dilation at the GE junction to  70mm. Candida esophagitis.    . SYNCOPE 07/30/2009   Occurred 2011. Hospitalized. Felt due to dehydration.    . TRANSIENT ISCHEMIC ATTACKS, HX OF 03/12/2007   Qualifier: Diagnosis of  By: Julien Girt CMA, [place]      Patient's surgical history, family medical history, social history, medications and allergies were all reviewed in Epic    Physical Exam: BP 112/68   Pulse 64   Ht 5\' 1"  (1.549 m)   Wt 100 lb 12.8 oz (45.7 kg)   BMI 19.05 kg/m   GENERAL: petite white  female in wheelchair in NAD PSYCH: :Pleasant, cooperative, normal affect EENT:  conjunctiva pink, mucous membranes moist, neck supple without masses CARDIAC:  RRR, no peripheral edema PULM: Normal respiratory effort, lungs CTA bilaterally, no wheezing ABDOMEN:  Limited exam in wheelchair. Abdomen nontender, nondistended, normal bowel sounds SKIN:  turgor, no lesions seen NEURO: Alert and oriented x 3, no focal neurologic deficits MUSCULOSKELETAL: kyphosis, appears rotated   ASSESSMENT and PLAN:  1. 83 yo female with achalasia. Did great for over a year following last EGD with botox in Aug 2016, now with recurrent dysphagia, regurgitation of pills and an 8 pound weight loss per son.    - soft diet at nursing home  - patient will need repeat EGD with botox to be done at hospital with Dr. Ardis Hughs who will become patient's GI MD since Olevia Perches and Deatra Ina have left. Patient is on Xarelto which will need to be held for procedure. The son is present, he understands the risks of the procedure and definitely wants to proceed.  -continue SL procardia  2. COPD with asthma, stable.   3. Hx of DVT. Hold Xarelto for 2 days days before procedure - will instruct when and how to resume after procedure.  Will communicate by phone or EMR with patient's prescribing provider to confirm that holding Xarelto is reasonable in this case.  4. Hx of dementia   Tye Savoy , NP 05/09/2016, 11:43 AM

## 2016-05-10 NOTE — Telephone Encounter (Signed)
Dr Ardis Hughs, You are doing an Endoscopy on this pt at [facility] on 3/22 she is on Xarelto. Dr Jeanmarie Hubert sent back to hold Xarelto 5 days.... Its usually 1-2 days.. How many days do you want to hold he Xarelto for her procedure   Thanks Shirlean Mylar

## 2016-05-10 NOTE — Telephone Encounter (Signed)
2 day hold is fine for xarelto.  thanks

## 2016-05-10 NOTE — Telephone Encounter (Signed)
[facility] informed and Son Gene informed ok to hold Xarelto 2 days before procedure

## 2016-05-11 NOTE — Progress Notes (Signed)
I agree with the above note, plan 

## 2016-05-16 ENCOUNTER — Encounter (HOSPITAL_COMMUNITY): Payer: Self-pay | Admitting: *Deleted

## 2016-05-16 DIAGNOSIS — M47816 Spondylosis without myelopathy or radiculopathy, lumbar region: Secondary | ICD-10-CM | POA: Diagnosis not present

## 2016-05-16 NOTE — Progress Notes (Signed)
Requested records and [facility] from [facility] to [facility] faxed

## 2016-05-16 NOTE — Progress Notes (Addendum)
Preop instructions for:   Sherri  Crawford                      Date of Birth   [birth year]                         Date of Procedure:  05-18-16     DoctorJACOBS Time to arrive at [facility]:745 Report to: Admitting  Procedure:ESOPHAGOGASTRODUODENOSCOPY AND BOTOX INJECTION Any procedure time changes, MD office will notify you!   Do not eat or drink past midnight the night before your procedure.(To include any tube feedings-must be discontinued)  Take these morning medications only BREO ELLIPTA INHALER, AFRIN NASAL SPRAY, ALBUTEROL INHALER IF NEEDED, GIVE NO MEDS BY MOUTH SINCE PATIENT MUST TAKE MEDS WITH FOOD!  Note: No Insulin or Diabetic meds should be given or taken the morning of the procedure!   Facility contact:   [facility] NURSE                Phone:    [place]: GENE Polidore SON TO BRING PT CELL [phone]  Transportation contact phone#: SON   Please send day of procedure:current med list and meds last taken that day, confirm nothing by mouth status from what time, Patient Demographic info( to include DNR status, problem list, allergies)   RN contact name/phone#:  [place] PHONE [phone] Fax #:[phone]  Bring Insurance card and picture ID Leave all jewelry and other valuables at place where living( no metal or rings to be worn) No contact lens Women-no make-up, no lotions,perfumes,powders Men-no colognes,lotions  Any questions day of procedure,call Endoscopy unit-[phone]!   Sent from :[facility]                   Phone:[phone]                   [phone]  Sent by :RN Ivin Booty [facility]

## 2016-05-16 NOTE — Progress Notes (Signed)
Spoke with pt guardian ulysses j cozart jr and made aware of procedure time and place and he will come and sign consent. aclled [facility] and requested last hand p note and current medication record from [facility].

## 2016-05-17 NOTE — Progress Notes (Signed)
Spoke with son gene Duffus he is bring patient .

## 2016-05-17 NOTE — Progress Notes (Signed)
Left message 05-16-17 and 05-17-16 for poa son gene Arrey to verify he is coming and bring patient

## 2016-05-17 NOTE — Progress Notes (Signed)
Chris church at [employer] called and received faxed pre op instructions and has no questions

## 2016-05-18 ENCOUNTER — Ambulatory Visit (HOSPITAL_COMMUNITY): Payer: Medicare Other | Admitting: Anesthesiology

## 2016-05-18 ENCOUNTER — Ambulatory Visit (HOSPITAL_COMMUNITY)
Admission: RE | Admit: 2016-05-18 | Discharge: 2016-05-18 | Disposition: A | Discharge status: Home / Self Care | Payer: Medicare Other | Source: Ambulatory Visit | Attending: Gastroenterology | Admitting: Gastroenterology

## 2016-05-18 ENCOUNTER — Encounter (HOSPITAL_COMMUNITY)
Admission: RE | Disposition: A | Discharge status: Home / Self Care | Payer: Self-pay | Source: Ambulatory Visit | Attending: Gastroenterology

## 2016-05-18 ENCOUNTER — Encounter (HOSPITAL_COMMUNITY): Payer: Self-pay | Admitting: Gastroenterology

## 2016-05-18 DIAGNOSIS — I10 Essential (primary) hypertension: Secondary | ICD-10-CM | POA: Diagnosis not present

## 2016-05-18 DIAGNOSIS — K219 Gastro-esophageal reflux disease without esophagitis: Secondary | ICD-10-CM | POA: Diagnosis not present

## 2016-05-18 DIAGNOSIS — D473 Essential (hemorrhagic) thrombocythemia: Secondary | ICD-10-CM | POA: Diagnosis not present

## 2016-05-18 DIAGNOSIS — K220 Achalasia of cardia: Secondary | ICD-10-CM | POA: Insufficient documentation

## 2016-05-18 DIAGNOSIS — E7800 Pure hypercholesterolemia, unspecified: Secondary | ICD-10-CM | POA: Insufficient documentation

## 2016-05-18 DIAGNOSIS — R634 Abnormal weight loss: Secondary | ICD-10-CM

## 2016-05-18 DIAGNOSIS — R1310 Dysphagia, unspecified: Secondary | ICD-10-CM | POA: Insufficient documentation

## 2016-05-18 DIAGNOSIS — F329 Major depressive disorder, single episode, unspecified: Secondary | ICD-10-CM | POA: Insufficient documentation

## 2016-05-18 DIAGNOSIS — I2510 Atherosclerotic heart disease of native coronary artery without angina pectoris: Secondary | ICD-10-CM | POA: Insufficient documentation

## 2016-05-18 DIAGNOSIS — I80203 Phlebitis and thrombophlebitis of unspecified deep vessels of lower extremities, bilateral: Secondary | ICD-10-CM | POA: Insufficient documentation

## 2016-05-18 DIAGNOSIS — K222 Esophageal obstruction: Secondary | ICD-10-CM | POA: Insufficient documentation

## 2016-05-18 DIAGNOSIS — Z8673 Personal history of transient ischemic attack (TIA), and cerebral infarction without residual deficits: Secondary | ICD-10-CM | POA: Insufficient documentation

## 2016-05-18 DIAGNOSIS — J449 Chronic obstructive pulmonary disease, unspecified: Secondary | ICD-10-CM | POA: Diagnosis not present

## 2016-05-18 DIAGNOSIS — Z79899 Other long term (current) drug therapy: Secondary | ICD-10-CM | POA: Diagnosis not present

## 2016-05-18 DIAGNOSIS — Z86718 Personal history of other venous thrombosis and embolism: Secondary | ICD-10-CM | POA: Insufficient documentation

## 2016-05-18 HISTORY — PX: BOTOX INJECTION: SHX5754

## 2016-05-18 HISTORY — PX: ESOPHAGOGASTRODUODENOSCOPY (EGD) WITH PROPOFOL: SHX5813

## 2016-05-18 SURGERY — ESOPHAGOGASTRODUODENOSCOPY (EGD) WITH PROPOFOL
Anesthesia: Monitor Anesthesia Care

## 2016-05-18 MED ORDER — SODIUM CHLORIDE 0.9 % IJ SOLN
INTRAMUSCULAR | Status: AC
  Filled 2016-05-18: qty 10

## 2016-05-18 MED ORDER — SODIUM CHLORIDE 0.9 % IV SOLN: INTRAVENOUS | Status: DC

## 2016-05-18 MED ORDER — LIDOCAINE 2% (20 MG/ML) 5 ML SYRINGE
INTRAMUSCULAR | Status: AC
  Filled 2016-05-18: qty 5

## 2016-05-18 MED ORDER — PROPOFOL 10 MG/ML IV BOLUS
INTRAVENOUS | Status: DC | PRN
  Administered 2016-05-18: 20 mg via INTRAVENOUS

## 2016-05-18 MED ORDER — LACTATED RINGERS IV SOLN
INTRAVENOUS | Status: DC
  Administered 2016-05-18: 09:00:00 via INTRAVENOUS

## 2016-05-18 MED ORDER — LIDOCAINE 2% (20 MG/ML) 5 ML SYRINGE
INTRAMUSCULAR | Status: DC | PRN
  Administered 2016-05-18: 60 mg via INTRAVENOUS

## 2016-05-18 MED ORDER — ONABOTULINUMTOXINA 100 UNITS IJ SOLR
INTRAMUSCULAR | Status: AC
  Filled 2016-05-18: qty 100

## 2016-05-18 MED ORDER — SODIUM CHLORIDE 0.9 % IJ SOLN
INTRAMUSCULAR | Status: DC | PRN
  Administered 2016-05-18: 09:00:00 via SUBMUCOSAL

## 2016-05-18 MED ORDER — PROPOFOL 10 MG/ML IV BOLUS
INTRAVENOUS | Status: AC
  Filled 2016-05-18: qty 60

## 2016-05-18 MED ORDER — PROPOFOL 500 MG/50ML IV EMUL
INTRAVENOUS | Status: DC | PRN
  Administered 2016-05-18: 50 ug/kg/min via INTRAVENOUS

## 2016-05-18 SURGICAL SUPPLY — 14 items

## 2016-05-18 NOTE — Progress Notes (Signed)
Pt. Very hypertensive in admit and recovery.  Dr. Orene Desanctis made aware.  Pt without BP meds this am.  Family and pt educated on importance of taking BP meds upon return home.   Will continue to monitor pt.   Laverta Baltimore, RN

## 2016-05-18 NOTE — Discharge Instructions (Signed)

## 2016-05-18 NOTE — Anesthesia Preprocedure Evaluation (Addendum)
Anesthesia Evaluation  Patient identified by MRN, date of birth, ID band Patient awake    Reviewed: Allergy & Precautions, NPO status , Patient's Chart, lab work & pertinent test results  Airway Mallampati: II  TM Distance: >3 FB Neck ROM: Full    Dental no notable dental hx. (+) Teeth Intact   Pulmonary asthma , COPD,    breath sounds clear to auscultation       Cardiovascular hypertension, + CAD and + Peripheral Vascular Disease   Rhythm:Regular Rate:Normal     Neuro/Psych    GI/Hepatic GERD  ,  Endo/Other    Renal/GU      Musculoskeletal  (+) Arthritis ,   Abdominal   Peds  Hematology   Anesthesia Other Findings   Reproductive/Obstetrics                           Anesthesia Physical Anesthesia Plan  ASA: III  Anesthesia Plan: MAC   Post-op Pain Management:    Induction: Intravenous  Airway Management Planned: Natural Airway and Nasal Cannula  Additional Equipment:   Intra-op Plan:   Post-operative Plan:   Informed Consent: I have reviewed the patients History and Physical, chart, labs and discussed the procedure including the risks, benefits and alternatives for the proposed anesthesia with the patient or authorized representative who has indicated his/her understanding and acceptance.     Plan Discussed with: CRNA  Anesthesia Plan Comments:        Anesthesia Quick Evaluation

## 2016-05-18 NOTE — Anesthesia Postprocedure Evaluation (Addendum)
Anesthesia Post Note  Patient: IXEL BOEHNING  Procedure(s) Performed: Procedure(s) (LRB): ESOPHAGOGASTRODUODENOSCOPY (EGD) WITH PROPOFOL (N/A) BOTOX INJECTION (N/A)  Patient location during evaluation: Endoscopy Anesthesia Type: MAC Level of consciousness: awake and alert Pain management: pain level controlled Vital Signs Assessment: post-procedure vital signs reviewed and stable Respiratory status: spontaneous breathing, nonlabored ventilation, respiratory function stable and patient connected to nasal cannula oxygen Cardiovascular status: stable and blood pressure returned to baseline Anesthetic complications: no       Last Vitals:  Vitals:   05/18/16 1010 05/18/16 1020  BP: (!) 197/61   Pulse: 65 71  Resp: 18 20  Temp:      Last Pain:  Vitals:   05/18/16 0929  TempSrc: Oral                 Charda Janis,JAMES TERRILL

## 2016-05-18 NOTE — H&P (View-Only) (Signed)
HPI: Patient is a 83 year old female, nursing home resident previously followed previously by Dr. Olevia Perches, then briefly by Dr. Deatra Ina. She has a history COPD and DVT and is on chronic Xarelto. Her GI history is pertinent for achalasia / food impactions and has been treated with dilations as well as botox injections (last one Aug 2016). Patient here with son who provides the history gathered from nursing home staff. Until a couple of months ago patient did very well following last botox injection. Lately she has been having recurrent problems swallowing food and pills. Staff has apparently found that patient has vomited some of her pills recently. Per son, weight was 108 pounds a few months back, she is now 100.8 pounds. Patient doesn't complain to staff nor her son. Per nursing home records patient is still taking TID procardia.    Past Medical History:  Diagnosis Date  . Abnormal weight gain 05/09/2012  . Achalasia of esophagus 09/18/2013   09/18/13 Nifedipine 10mg  tid ac meals. 11/17/14 GI [facility] f/u as neeed   . Anxiety and depression 03/12/2012   Increased Sertraline to 50mg  daily 07/16/13 08/26/13 Mirtazapine 7.5mg  qhs  10/12/14 Pharm decreased Sertraline to 25mg .  05/04/15 TSH 2.89 09/15/15 pharm taper off Sertraline.  10/08/15 continue Sertraline 25mg  daily, POA desires. 12/02/15 wbc 6.1, Hgb 12.0, plt 271    . Asthmatic bronchitis   . BACK PAIN, LUMBAR 03/12/2007   Hx of lower back pain, had X-ray of her lumbar spine 09/08/12--spondylosis-better controlled with Hydrocodone/ApAp  10/325mg  ac and hs  01/01/13 lumbar spine inj. Able to ambulate with walker on unit until her fall 06/22/13. 06/25/13 unremarkable. X-ray lumbar spine. 11/10/13 dc Oxycodone prn-not used in 60 days.     . COLONIC POLYPS 03/12/2007   Initially noted April 2002 colonoscopy. Followup colonoscopy 05/31/2006 did not disclose any further polyps.    Marland Kitchen COPD (chronic obstructive pulmonary disease) ([place])   . Coronary artery disease   . Deep  vein thrombosis of left lower extremity ([place]) 07/19/2012  . Dementia 09/16/2012   05/04/15 TSH 2.89   . Depression   . Diverticulosis 05/10/2012  . DJD (degenerative joint disease)   . Dry skin dermatitis 11/18/2012  . Edema of leg 07/01/2010  . Epistaxis 01/24/2016   01/21/16: x2, held Xarelto x 1 day, Saline Nasal spray prn, wbc 9.5, Hgb 12.4, Plt 331, Na 138, K 4.5, Bun 25, creat 0.89 01/22/16 VOZ36(64-40)  . Esophageal dysmotility 07/26/2013  . Esophageal dysphagia 05/27/2010   New dx of achalasia 6/ 2015 09/18/13 Nifedipine 10mg  tid ac meals.    . Esophageal stricture   . Essential hypertension, benign 10/30/2013   12/02/15 wbc 6.1, Hgb 12.0, plt 271   . Essential thrombocythemia ([place]) 06/09/2009  . Fall 06/25/2013  . GERD (gastroesophageal reflux disease)   . Hip fracture ([place]) 09/08/2012  . HYPERCHOLESTEROLEMIA 03/12/2007   08/10/15 cholesterol 119, triglycerides 143, HDL 51, LDL 39   . Lumbar back pain   . MACULAR DEGENERATION 01/26/2008   Qualifier: Diagnosis of  By: Lenna Gilford MD, Deborra Medina   . Osteoarthritis 03/12/2007  . Osteoporosis 03/12/2007   Qualifier: Diagnosis of  By: Julien Girt CMA, Marliss Czar  06/15/14 dc Vit D 1000u po daily per pharm.    . Phlebitis and thrombophlebitis of other deep vessels of lower extremities 07/10/2010  . S/P balloon dilatation of esophageal stricture 07/28/2013   07/26/13. Dr. Henrene Pastor. EGD foreign removal and Balloon dilation of esophagus. Esophageal dysmotility, suspected achalasia, dilation at the GE junction to  46mm. Candida esophagitis.    . SYNCOPE 07/30/2009   Occurred 2011. Hospitalized. Felt due to dehydration.    . TRANSIENT ISCHEMIC ATTACKS, HX OF 03/12/2007   Qualifier: Diagnosis of  By: Julien Girt CMA, Leigh      Patient's surgical history, family medical history, social history, medications and allergies were all reviewed in Epic    Physical Exam: BP 112/68   Pulse 64   Ht 5\' 1"  (1.549 m)   Wt 100 lb 12.8 oz (45.7 kg)   BMI 19.05 kg/m   GENERAL: petite white  female in wheelchair in NAD PSYCH: :Pleasant, cooperative, normal affect EENT:  conjunctiva pink, mucous membranes moist, neck supple without masses CARDIAC:  RRR, no peripheral edema PULM: Normal respiratory effort, lungs CTA bilaterally, no wheezing ABDOMEN:  Limited exam in wheelchair. Abdomen nontender, nondistended, normal bowel sounds SKIN:  turgor, no lesions seen NEURO: Alert and oriented x 3, no focal neurologic deficits MUSCULOSKELETAL: kyphosis, appears rotated   ASSESSMENT and PLAN:  1. 83 yo female with achalasia. Did great for over a year following last EGD with botox in Aug 2016, now with recurrent dysphagia, regurgitation of pills and an 8 pound weight loss per son.    - soft diet at nursing home  - patient will need repeat EGD with botox to be done at hospital with Dr. Ardis Hughs who will become patient's GI MD since Olevia Perches and Deatra Ina have left. Patient is on Xarelto which will need to be held for procedure. The son is present, he understands the risks of the procedure and definitely wants to proceed.  -continue SL procardia  2. COPD with asthma, stable.   3. Hx of DVT. Hold Xarelto for 2 days days before procedure - will instruct when and how to resume after procedure.  Will communicate by phone or EMR with patient's prescribing provider to confirm that holding Xarelto is reasonable in this case.  4. Hx of dementia   Tye Savoy , NP 05/09/2016, 11:43 AM

## 2016-05-18 NOTE — Interval H&P Note (Signed)
History and Physical Interval Note:  05/18/2016 8:19 AM  Sherri Crawford  has presented today for surgery, with the diagnosis of Dysphagia  Achalasia  The various methods of treatment have been discussed with the patient and family. After consideration of risks, benefits and other options for treatment, the patient has consented to  Procedure(s): ESOPHAGOGASTRODUODENOSCOPY (EGD) WITH PROPOFOL (N/A) BOTOX INJECTION (N/A) as a surgical intervention .  The patient's history has been reviewed, patient examined, no change in status, stable for surgery.  I have reviewed the patient's chart and labs.  Questions were answered to the patient's satisfaction.     Milus Banister

## 2016-05-18 NOTE — Anesthesia Procedure Notes (Signed)
Procedure Name: MAC Date/Time: 05/18/2016 9:04 AM Performed by: Dione Booze Pre-anesthesia Checklist: Patient identified, Emergency Drugs available, Suction available and Patient being monitored Patient Re-evaluated:Patient Re-evaluated prior to inductionOxygen Delivery Method: Nasal cannula Placement Confirmation: positive ETCO2

## 2016-05-18 NOTE — Transfer of Care (Signed)
Immediate Anesthesia Transfer of Care Note  Patient: Sherri Crawford  Procedure(s) Performed: Procedure(s): ESOPHAGOGASTRODUODENOSCOPY (EGD) WITH PROPOFOL (N/A) BOTOX INJECTION (N/A)  Patient Location: PACU and Endoscopy Unit  Anesthesia Type:MAC  Level of Consciousness: awake and patient cooperative  Airway & Oxygen Therapy: Patient Spontanous Breathing and Patient connected to nasal cannula oxygen  Post-op Assessment: Report given to RN and Post -op Vital signs reviewed and stable  Post vital signs: Reviewed and stable  Last Vitals:  Vitals:   05/18/16 0853  BP: (!) 211/63  Pulse: 67  Resp: 18  Temp: 36.6 C    Last Pain:  Vitals:   05/18/16 0853  TempSrc: Oral         Complications: No apparent anesthesia complications

## 2016-05-18 NOTE — Op Note (Signed)
[facility] Patient Name: Sherri Crawford Procedure Date: 05/18/2016 MRN: [id number] Attending MD: Milus Banister , MD Date of Birth: June 15, 1920 CSN: [id number] Age: 83 Admit Type: Outpatient Procedure:                Upper GI endoscopy Indications:              Dysphagia, manometrically confirmed Achalasia that                            has previously responded to GE junction Botox                            injections (last was with Dr. Deatra Ina 2016) Providers:                Milus Banister, MD, Laverta Baltimore RN, RN,                            Cherylynn Ridges, Technician, Dione Booze, CRNA Referring MD:              Medicines:                Monitored Anesthesia Care Complications:            No immediate complications. Estimated blood loss:                            None. Estimated Blood Loss:     Estimated blood loss: none. Procedure:                Pre-Anesthesia Assessment:                           - Prior to the procedure, a History and Physical                            was performed, and patient medications and                            allergies were reviewed. The patient's tolerance of                            previous anesthesia was also reviewed. The risks                            and benefits of the procedure and the sedation                            options and risks were discussed with the patient.                            All questions were answered, and informed consent                            was obtained. Prior Anticoagulants: The patient has  taken Xarelto (rivaroxaban), last dose was 3 days                            prior to procedure. ASA Grade Assessment: IV - A                            patient with severe systemic disease that is a                            constant threat to life. After reviewing the risks                            and benefits, the patient was deemed in           satisfactory condition to undergo the procedure.                           After obtaining informed consent, the endoscope was                            passed under direct vision. Throughout the                            procedure, the patient's blood pressure, pulse, and                            oxygen saturations were monitored continuously. The                            EG-[id number] ([id number]) scope was introduced through the                            mouth, and advanced to the second part of duodenum.                            The upper GI endoscopy was accomplished without                            difficulty. The patient tolerated the procedure                            well. Scope In: Scope Out: Findings:      One mild, smooth benign-appearing, intrinsic stenosis was found in the       distal esophagus. I was able to advance standard gastroscope into the       stomach with mild pressure only. 100 units botulinum toxin were injected       (25 units in four quadrants) just above the Z line.      The exam was otherwise without significant abnormality. Impression:               - Benign-appearing esophageal stenosis from                            previously known Achalasia. Injected with botulinum  toxin.                           - The examination was otherwise essentially normal.                           - No specimens collected. Moderate Sedation:      N/A- Per Anesthesia Care Recommendation:           - Patient has a contact number available for                            emergencies. The signs and symptoms of potential                            delayed complications were discussed with the                            patient. Return to normal activities tomorrow.                            Written discharge instructions were provided to the                            patient.                           - Liquid diet and advance as  tolerated.                           - Continue present medications.                           - Repeat upper endoscopy PRN for retreatment. Procedure Code(s):        --- Professional ---                           [id number], Esophagogastroduodenoscopy, flexible,                            transoral; with directed submucosal injection(s),                            any substance Diagnosis Code(s):        --- Professional ---                           K22.2, Esophageal obstruction                           R13.10, Dysphagia, unspecified CPT copyright 2016 [employer]. All rights reserved. The codes documented in this report are preliminary and upon coder review may  be revised to meet current compliance requirements. Milus Banister, MD 05/18/2016 9:25:43 AM This report has been signed electronically. Number of Addenda: 0

## 2016-05-21 ENCOUNTER — Encounter (HOSPITAL_COMMUNITY): Payer: Self-pay | Admitting: Gastroenterology

## 2016-05-29 ENCOUNTER — Non-Acute Institutional Stay (SKILLED_NURSING_FACILITY): Payer: Medicare Other | Admitting: Internal Medicine

## 2016-05-29 ENCOUNTER — Encounter: Payer: Self-pay | Admitting: Internal Medicine

## 2016-05-29 DIAGNOSIS — Z7901 Long term (current) use of anticoagulants: Secondary | ICD-10-CM | POA: Diagnosis not present

## 2016-05-29 DIAGNOSIS — D473 Essential (hemorrhagic) thrombocythemia: Secondary | ICD-10-CM

## 2016-05-29 DIAGNOSIS — F32A Depression, unspecified: Secondary | ICD-10-CM

## 2016-05-29 DIAGNOSIS — F0150 Vascular dementia without behavioral disturbance: Secondary | ICD-10-CM | POA: Diagnosis not present

## 2016-05-29 DIAGNOSIS — I10 Essential (primary) hypertension: Secondary | ICD-10-CM

## 2016-05-29 DIAGNOSIS — K220 Achalasia of cardia: Secondary | ICD-10-CM | POA: Diagnosis not present

## 2016-05-29 DIAGNOSIS — F329 Major depressive disorder, single episode, unspecified: Secondary | ICD-10-CM

## 2016-05-29 DIAGNOSIS — F418 Other specified anxiety disorders: Secondary | ICD-10-CM

## 2016-05-29 DIAGNOSIS — F419 Anxiety disorder, unspecified: Secondary | ICD-10-CM

## 2016-05-29 NOTE — Progress Notes (Signed)
Progress Note    Location:  [facility] Room Number: N35 Place of Service:  SNF [phone]) Provider:  Jeanmarie Hubert, MD  Patient Care Team: Estill Dooms, MD as PCP - General (Internal Medicine) Estill Dooms, MD as PCP - Internal Medicine (Internal Medicine) Annia Belt, MD as Consulting Physician (Hematology and Oncology) Suella Broad, MD as Consulting Physician (Physical Medicine and Rehabilitation) Noralee Space, MD (Pulmonary Disease) [facility] Otho Darner, NP as Nurse Practitioner (Nurse Practitioner) Inda Castle, MD as Consulting Physician (Gastroenterology)  Extended Emergency Contact Information Primary Emergency Contact: Leete,Gene Address: [address]          [place], [place] [address] [place] Phone: [phone] Work Phone: [phone] Mobile Phone: [phone] Relation: Son Secondary Emergency Contact: [name], [place] Phone: [phone] Relation: Son  Code Status:  DNR Goals of care: Advanced Directive information Advanced Directives 05/29/2016  Does Patient Have a Medical Advance Directive? Yes  Type of Paramedic of [place];Living will;Out of facility DNR (pink MOST or yellow form)  Does patient want to make changes to medical advance directive? -  Copy of [place] in Chart? Yes  Pre-existing out of facility DNR order (yellow form or pink MOST form) Yellow form placed in chart (order not valid for inpatient use)     Chief Complaint  Patient presents with  . Medical Management of Chronic Issues    routine visit  . EGD    05/18/16 EGE with Botox injection for Dysphagia Achalasia by Dr. Ardis Hughs    HPI:  Pt is a 83 y.o. female seen today for medical management of chronic diseases.     Past Medical History:  Diagnosis Date  . Abnormal weight gain 05/09/2012  . Achalasia of  esophagus 09/18/2013   09/18/13 Nifedipine 10mg  tid ac meals. 11/17/14 [facility] f/u as neeed   . Anxiety and depression 03/12/2012   Increased Sertraline to 50mg  daily 07/16/13 08/26/13 Mirtazapine 7.5mg  qhs  10/12/14 Pharm decreased Sertraline to 25mg .  05/04/15 TSH 2.89 09/15/15 pharm taper off Sertraline.  10/08/15 continue Sertraline 25mg  daily, POA desires. 12/02/15 wbc 6.1, Hgb 12.0, plt 271    . Asthmatic bronchitis   . BACK PAIN, LUMBAR 03/12/2007   Hx of lower back pain, had X-ray of her lumbar spine 09/08/12--spondylosis-better controlled with Hydrocodone/ApAp  10/325mg  ac and hs  01/01/13 lumbar spine inj. Able to ambulate with walker on unit until her fall 06/22/13. 06/25/13 unremarkable. X-ray lumbar spine. 11/10/13 dc Oxycodone prn-not used in 60 days.     . COLONIC POLYPS 03/12/2007   Initially noted April 2002 colonoscopy. Followup colonoscopy 05/31/2006 did not disclose any further polyps.    Marland Kitchen COPD (chronic obstructive pulmonary disease) ([place])   . Coronary artery disease   . Deep vein thrombosis of left lower extremity ([place]) 07/19/2012  . Dementia 09/16/2012   05/04/15 TSH 2.89   . Depression   . Diverticulosis 05/10/2012  . DJD (degenerative joint disease)   . Dry skin dermatitis 11/18/2012  . Edema of leg 07/01/2010  . Epistaxis 01/24/2016   01/21/16: x2, held Xarelto x 1 day, Saline Nasal spray prn, wbc 9.5, Hgb 12.4, Plt 331, Na 138, K 4.5, Bun 25, creat 0.89 01/22/16 IEP32(95-18)  . Esophageal dysmotility 07/26/2013  . Esophageal dysphagia 05/27/2010   New dx of achalasia 6/  2015 09/18/13 Nifedipine 10mg  tid ac meals.    . Esophageal stricture   . Essential hypertension, benign 10/30/2013   12/02/15 wbc 6.1, Hgb 12.0, plt 271   . Essential thrombocythemia ([place]) 06/09/2009  . Fall 06/25/2013  . GERD (gastroesophageal reflux disease)   . Hip fracture ([place]) 09/08/2012  . HYPERCHOLESTEROLEMIA 03/12/2007   08/10/15 cholesterol 119, triglycerides 143, HDL 51, LDL 39   . Lumbar back pain   . MACULAR  DEGENERATION 01/26/2008   Qualifier: Diagnosis of  By: Lenna Gilford MD, Deborra Medina   . Osteoarthritis 03/12/2007  . Osteoporosis 03/12/2007   Qualifier: Diagnosis of  By: Julien Girt CMA, Marliss Czar  06/15/14 dc Vit D 1000u po daily per pharm.    . Phlebitis and thrombophlebitis of other deep vessels of lower extremities 07/10/2010  . S/P balloon dilatation of esophageal stricture 07/28/2013   07/26/13. Dr. Henrene Pastor. EGD foreign removal and Balloon dilation of esophagus. Esophageal dysmotility, suspected achalasia, dilation at the GE junction to 42mm. Candida esophagitis.    . SYNCOPE 07/30/2009   Occurred 2011. Hospitalized. Felt due to dehydration.    . TRANSIENT ISCHEMIC ATTACKS, HX OF 03/12/2007   Qualifier: Diagnosis of  By: Julien Girt CMA, Marliss Czar     Past Surgical History:  Procedure Laterality Date  . ABDOMINAL HYSTERECTOMY     and bso  . APPENDECTOMY    . BOTOX INJECTION N/A 09/18/2013   Procedure: BOTOX INJECTION;  Surgeon: Lafayette Dragon, MD;  Location: [facility];  Service: Endoscopy;  Laterality: N/A;  . BOTOX INJECTION N/A 10/16/2014   Procedure: BOTOX INJECTION;  Surgeon: Inda Castle, MD;  Location: [facility];  Service: Endoscopy;  Laterality: N/A;  . BOTOX INJECTION N/A 05/18/2016   Procedure: BOTOX INJECTION;  Surgeon: Milus Banister, MD;  Location: [facility];  Service: Endoscopy;  Laterality: N/A;  . CATARACT EXTRACTION W/ INTRAOCULAR LENS  IMPLANT, BILATERAL    . CHOLECYSTECTOMY    . ESOPHAGEAL MANOMETRY N/A 08/25/2013   Procedure: ESOPHAGEAL MANOMETRY (EM);  Surgeon: Jerene Bears, MD;  Location: [facility];  Service: Gastroenterology;  Laterality: N/A;  . ESOPHAGOGASTRODUODENOSCOPY N/A 07/26/2013   Procedure: ESOPHAGOGASTRODUODENOSCOPY (EGD);  Surgeon: Irene Shipper, MD;  Location: [facility] ENDOSCOPY;  Service: Endoscopy;  Laterality: N/A;  . ESOPHAGOGASTRODUODENOSCOPY N/A 10/16/2014   Procedure: ESOPHAGOGASTRODUODENOSCOPY (EGD);  Surgeon: Inda Castle, MD;  Location: [facility];  Service:  Endoscopy;  Laterality: N/A;  with botox  . ESOPHAGOGASTRODUODENOSCOPY (EGD) WITH ESOPHAGEAL DILATION N/A 11/25/2012   Procedure: ESOPHAGOGASTRODUODENOSCOPY (EGD) WITH ESOPHAGEAL DILATION;  Surgeon: Jerene Bears, MD;  Location: [facility];  Service: Gastroenterology;  Laterality: N/A;  . ESOPHAGOGASTRODUODENOSCOPY (EGD) WITH PROPOFOL N/A 09/18/2013   Procedure: ESOPHAGOGASTRODUODENOSCOPY (EGD) WITH PROPOFOL;  Surgeon: Lafayette Dragon, MD;  Location: [facility];  Service: Endoscopy;  Laterality: N/A;  . ESOPHAGOGASTRODUODENOSCOPY (EGD) WITH PROPOFOL N/A 05/18/2016   Procedure: ESOPHAGOGASTRODUODENOSCOPY (EGD) WITH PROPOFOL;  Surgeon: Milus Banister, MD;  Location: [facility];  Service: Endoscopy;  Laterality: N/A;  . HIP ARTHROPLASTY Right 09/09/2012   Procedure: ARTHROPLASTY BIPOLAR HIP;  Surgeon: Mauri Pole, MD;  Location: [facility];  Service: Orthopedics;  Laterality: Right;  . TAH and BSO  08/21/1995    Allergies  Allergen Reactions  . Sulfamethoxazole Rash    Allergies as of 05/29/2016      Reactions   Sulfamethoxazole Rash      Medication List       Accurate as of 05/29/16 11:15 AM. Always use your most recent med list.  acetaminophen 325 MG tablet Commonly known as:  TYLENOL Take 650 mg by mouth every 4 (four) hours as needed for moderate pain.   albuterol 108 (90 Base) MCG/ACT inhaler Commonly known as:  PROVENTIL HFA;VENTOLIN HFA Inhale 2 puffs into the lungs every 6 (six) hours as needed for wheezing or shortness of breath.   atorvastatin 20 MG tablet Commonly known as:  LIPITOR Take 20 mg by mouth at bedtime.   BREO ELLIPTA 100-25 MCG/INH Aepb Generic drug:  fluticasone furoate-vilanterol Inhale 1 puff into the lungs daily. One puff inhale once a day, rinse mouth after each use.   CHLORHEXIDINE GLUCONATE (BULK) Soln Take 5 mLs by mouth daily. 5 ml swish and spit for 30 seconds every day indefinitely   DENTA 5000 PLUS 1.1 % Crea dental cream Generic  drug:  sodium fluoride Place 1 application onto teeth at bedtime.   feeding supplement Liqd Take 90 mLs by mouth 2 (two) times daily.   Humidifier Misc Please use humidifier to prevent excessive dryness.   HYDROcodone-acetaminophen 10-325 MG tablet Commonly known as:  NORCO Take 1 tablet by mouth 4 (four) times daily.   hydroxyurea 500 MG capsule Commonly known as:  HYDREA Take 500-1,000 mg by mouth See admin instructions. Takes 1000 mg on Monday only Takes 500 mg on Sunday, Wednesday and Friday only   memantine 10 MG tablet Commonly known as:  NAMENDA Take 10 mg by mouth 2 (two) times daily.   metoprolol tartrate 25 MG tablet Commonly known as:  LOPRESSOR Take 25 mg by mouth 2 (two) times daily.   montelukast 10 MG tablet Commonly known as:  SINGULAIR Take 10 mg by mouth at bedtime.   NIFEdipine 10 MG capsule Commonly known as:  PROCARDIA Take 10 mg by mouth 3 (three) times daily. Squeeze contents of one capsule uner tongue 15-30 minutes before meals 3 times a day   nitroGLYCERIN 0.4 MG SL tablet Commonly known as:  NITROSTAT Place 0.4 mg under the tongue every 5 (five) minutes as needed for chest pain.   omeprazole 20 MG capsule Commonly known as:  PRILOSEC Take 20 mg by mouth at bedtime.   oxymetazoline 0.05 % nasal spray Commonly known as:  AFRIN Place 2 sprays into both nostrils 2 (two) times daily.   PRESERVISION AREDS 2 Caps Take 1 capsule by mouth 2 (two) times daily.   Rivaroxaban 15 MG Tabs tablet Commonly known as:  XARELTO Take 15 mg by mouth every evening. 5pm   sertraline 25 MG tablet Commonly known as:  ZOLOFT Take 25 mg by mouth at bedtime.   sodium chloride 0.65 % Soln nasal spray Commonly known as:  OCEAN Place 2 sprays into both nostrils 3 (three) times daily.       Review of Systems  Constitutional: Negative for activity change, appetite change, chills, diaphoresis, fatigue and fever.  HENT: Positive for hearing loss. Negative for  congestion, ear discharge, ear pain, nosebleeds, sore throat and tinnitus.   Eyes: Negative for photophobia, pain, discharge and redness.  Respiratory: Positive for cough. Negative for shortness of breath (Morning are worse, but she remains dyspneic throughout the day.), wheezing and stridor.   Cardiovascular: Negative for chest pain, palpitations and leg swelling.       Hx of Left DVT 2014  Gastrointestinal: Negative for abdominal pain, blood in stool, constipation, diarrhea, nausea and vomiting.  Endocrine: Negative for polydipsia.  Genitourinary: Positive for frequency. Negative for dysuria, flank pain, hematuria and urgency.  Musculoskeletal: Positive for back  pain and gait problem. Negative for myalgias and neck pain.       Lower extremity weakness. [place] wheelchair.  Skin: Negative for rash.  Allergic/Immunologic: Negative for environmental allergies.  Neurological: Negative for dizziness, tremors, seizures, weakness and headaches.  Hematological: Does not bruise/bleed easily.       Thrombocythemia treated with hydroxyurea  Psychiatric/Behavioral: Positive for confusion. Negative for hallucinations and suicidal ideas. The patient is nervous/anxious (impoved on sertraline).     Immunization History  Administered Date(s) Administered  . Influenza Split 11/16/2010, 12/13/2011  . Influenza Whole 11/27/2009  . Influenza-Unspecified 12/31/2013, 11/18/2014, 12/14/2015  . PPD Test 09/11/2012  . Pneumococcal Polysaccharide-23 02/28/1988   Pertinent  Health Maintenance Due  Topic Date Due  . PNA vac Low Risk Adult (2 of 2 - PCV13) 02/27/1989  . INFLUENZA VACCINE  09/27/2016  . DEXA SCAN  Completed   Fall Risk  10/04/2015 12/28/2014 10/23/2014 11/24/2013  Falls in the past year? No Yes Yes Yes  Number falls in past yr: - 1 1 2  or more  Injury with Fall? - No No -  Risk for fall due to : Impaired mobility;Impaired balance/gait History of fall(s);Impaired balance/gait;Impaired mobility  Impaired mobility History of fall(s);Impaired balance/gait;Impaired vision;Impaired mobility  Follow up - Falls prevention discussed Falls evaluation completed -    Vitals:   05/29/16 1059  BP: 120/70  Pulse: 76  Resp: 18  Temp: 98.2 F (36.8 C)  Weight: 98 lb 11.2 oz (44.8 kg)  Height: 5\' 1"  (1.549 m)   Body mass index is 18.65 kg/m. Physical Exam  Constitutional: She is oriented to person, place, and time. She appears well-developed and well-nourished. No distress.  HENT:  Head: Normocephalic and atraumatic.  Hx of right nasal septal bleeding at Kiesselbach's plexus. Scarring fom cautery in this area.  Eyes: Conjunctivae and EOM are normal. Pupils are equal, round, and reactive to light.  Neck: Normal range of motion. Neck supple. No JVD present. No thyromegaly present.  Cardiovascular: Normal rate, regular rhythm and normal heart sounds.   Pulmonary/Chest: Effort normal. She has decreased breath sounds. She has no rales.  Decreased breath sounds Bilat. Lungs.   Abdominal: Soft. Bowel sounds are normal. There is no tenderness.  Musculoskeletal: Normal range of motion. She exhibits edema. She exhibits no tenderness.  Lower back-baseline: pain is well controlled with prn inj, narcotics, w/c   Lymphadenopathy:    She has no cervical adenopathy.  Neurological: She is alert and oriented to person, place, and time. She has normal reflexes. No cranial nerve deficit. She exhibits normal muscle tone. Coordination normal.  8/ 26/16 BIMS score 14/15  Skin: Skin is warm and dry. No rash noted. She is not diaphoretic. No erythema (LLE).  The right hip surgical scar  Psychiatric: She has a normal mood and affect. Her affect is not angry, not blunt, not labile and not inappropriate. Her speech is not delayed, not tangential and not slurred. She is hyperactive and slowed. She is not agitated, not aggressive, not withdrawn and not combative. Thought content is not paranoid and not delusional.  Cognition and memory are impaired. She does not express impulsivity or inappropriate judgment. She exhibits abnormal recent memory.    Labs reviewed:  Recent Labs  01/21/16 02/10/16 1530 03/09/16  NA 138 139 143  K 4.5 4.3 3.9  CL  --  101  --   GLUCOSE  --  113*  --   BUN 25* 24* 17  CREATININE 0.9 0.90 0.7  Recent Labs  03/09/16  AST 17  ALT 10  ALKPHOS 68    Recent Labs  01/21/16 02/10/16 1530 03/09/16 05/02/16  WBC 9.5  --  4.8 7.9  HGB 12.4 13.6 11.9* 11.1*  HCT 37 40.0 36 34*  PLT 331  --  245 274   Lab Results  Component Value Date   TSH 2.89 05/04/2015   Lab Results  Component Value Date   HGBA1C 5.5 10/16/2014   Lab Results  Component Value Date   CHOL 113 02/10/2016   HDL 42 02/10/2016   LDLCALC 43 02/10/2016   LDLDIRECT 152.3 06/04/2009   TRIG 138 02/10/2016   CHOLHDL 3 09/13/2011    Assessment/Plan 1. Vascular dementia without behavioral disturbance stable  2. Anxiety and depression improved o sertraline  3. Achalasia staable  4. Essential hypertension, benign controlled  5. Essential thrombocythemia ([place]) Normal on hydroxyurea  6. Long-term (current) use of anticoagulants For hx right DVT 2014. I see no reason to continue anticoagulation at this time, since platelets are normal and her leg is not swollen.

## 2016-05-30 DIAGNOSIS — M6281 Muscle weakness (generalized): Secondary | ICD-10-CM | POA: Diagnosis not present

## 2016-05-30 DIAGNOSIS — K219 Gastro-esophageal reflux disease without esophagitis: Secondary | ICD-10-CM | POA: Diagnosis not present

## 2016-05-30 DIAGNOSIS — F341 Dysthymic disorder: Secondary | ICD-10-CM | POA: Diagnosis not present

## 2016-05-30 DIAGNOSIS — K5793 Diverticulitis of intestine, part unspecified, without perforation or abscess with bleeding: Secondary | ICD-10-CM | POA: Diagnosis not present

## 2016-05-30 DIAGNOSIS — J209 Acute bronchitis, unspecified: Secondary | ICD-10-CM | POA: Diagnosis not present

## 2016-05-30 DIAGNOSIS — D649 Anemia, unspecified: Secondary | ICD-10-CM | POA: Diagnosis not present

## 2016-05-30 DIAGNOSIS — D539 Nutritional anemia, unspecified: Secondary | ICD-10-CM | POA: Diagnosis not present

## 2016-05-30 DIAGNOSIS — I2510 Atherosclerotic heart disease of native coronary artery without angina pectoris: Secondary | ICD-10-CM | POA: Diagnosis not present

## 2016-05-30 DIAGNOSIS — R2681 Unsteadiness on feet: Secondary | ICD-10-CM | POA: Diagnosis not present

## 2016-05-30 DIAGNOSIS — S72009A Fracture of unspecified part of neck of unspecified femur, initial encounter for closed fracture: Secondary | ICD-10-CM | POA: Diagnosis not present

## 2016-06-08 ENCOUNTER — Encounter: Payer: Self-pay | Admitting: Nurse Practitioner

## 2016-06-08 ENCOUNTER — Non-Acute Institutional Stay (SKILLED_NURSING_FACILITY): Payer: Medicare Other | Admitting: Nurse Practitioner

## 2016-06-08 DIAGNOSIS — F419 Anxiety disorder, unspecified: Secondary | ICD-10-CM | POA: Diagnosis not present

## 2016-06-08 DIAGNOSIS — F329 Major depressive disorder, single episode, unspecified: Secondary | ICD-10-CM

## 2016-06-08 DIAGNOSIS — I10 Essential (primary) hypertension: Secondary | ICD-10-CM

## 2016-06-08 DIAGNOSIS — M79675 Pain in left toe(s): Secondary | ICD-10-CM

## 2016-06-08 DIAGNOSIS — F32A Depression, unspecified: Secondary | ICD-10-CM

## 2016-06-08 DIAGNOSIS — M159 Polyosteoarthritis, unspecified: Secondary | ICD-10-CM

## 2016-06-08 DIAGNOSIS — I82402 Acute embolism and thrombosis of unspecified deep veins of left lower extremity: Secondary | ICD-10-CM | POA: Diagnosis not present

## 2016-06-08 DIAGNOSIS — F0150 Vascular dementia without behavioral disturbance: Secondary | ICD-10-CM | POA: Diagnosis not present

## 2016-06-08 DIAGNOSIS — M150 Primary generalized (osteo)arthritis: Secondary | ICD-10-CM | POA: Diagnosis not present

## 2016-06-08 DIAGNOSIS — J4489 Other specified chronic obstructive pulmonary disease: Secondary | ICD-10-CM

## 2016-06-08 DIAGNOSIS — K220 Achalasia of cardia: Secondary | ICD-10-CM

## 2016-06-08 DIAGNOSIS — J449 Chronic obstructive pulmonary disease, unspecified: Secondary | ICD-10-CM | POA: Diagnosis not present

## 2016-06-08 DIAGNOSIS — D473 Essential (hemorrhagic) thrombocythemia: Secondary | ICD-10-CM | POA: Diagnosis not present

## 2016-06-08 DIAGNOSIS — M8949 Other hypertrophic osteoarthropathy, multiple sites: Secondary | ICD-10-CM

## 2016-06-08 NOTE — Assessment & Plan Note (Addendum)
09/18/13 Nifedipine 10mg  tid ac meals. 11/17/14 [facility] f/u as neeed 05/18/16 endoscopy. Stable. Continue Omeprazole, Nifedipine.

## 2016-06-08 NOTE — Assessment & Plan Note (Signed)
Her mood is stable, continue Sertraline 25mg  since 10/12/14(GDR)

## 2016-06-08 NOTE — Assessment & Plan Note (Addendum)
F/u oncology, continue hydroxyurea 1000mg  q Monday, 500mg  Sun, Wed, Fri. 05/02/16 wbc 7.9, Hgb 11.1, plt 274

## 2016-06-08 NOTE — Assessment & Plan Note (Signed)
maintenance, takes Montelukast 10mg  daily, Breo Ellipta I daily inhaled, Ventolin HFA 2 puffs q6h prn.

## 2016-06-08 NOTE — Assessment & Plan Note (Signed)
Switched to [place] since 09/12/12. Left leg

## 2016-06-08 NOTE — Progress Notes (Signed)
Location:  [facility] Room Number: 35 Place of Service:  SNF (31) Provider:  Foxx Klarich, Manxie  NP  Jeanmarie Hubert, MD  Patient Care Team: Estill Dooms, MD as PCP - General (Internal Medicine) Estill Dooms, MD as PCP - Internal Medicine (Internal Medicine) Annia Belt, MD as Consulting Physician (Hematology and Oncology) Suella Broad, MD as Consulting Physician (Physical Medicine and Rehabilitation) Noralee Space, MD (Pulmonary Disease) [facility] Abdulkarim Eberlin Otho Darner, NP as Nurse Practitioner (Nurse Practitioner) Inda Castle, MD as Consulting Physician (Gastroenterology)  Extended Emergency Contact Information Primary Emergency Contact: Cranor,Gene Address: [address]          [place], [place] [address] [place] Phone: [phone] Work Phone: [phone] Mobile Phone: [phone] Relation: Son Secondary Emergency Contact: [name], [place] Phone: [phone] Relation: Son  Code Status:  DNR Goals of care: Advanced Directive information Advanced Directives 06/08/2016  Does Patient Have a Medical Advance Directive? Yes  Type of Paramedic of [place];Living will;Out of facility DNR (pink MOST or yellow form)  Does patient want to make changes to medical advance directive? No - Patient declined  Copy of [place] in Chart? Yes  Pre-existing out of facility DNR order (yellow form or pink MOST form) Yellow form placed in chart (order not valid for inpatient use)     Chief Complaint  Patient presents with  . Acute Visit    (L) 2nd toe swollen, reddness noted    HPI:  Pt is a 83 y.o. female seen today for an acute visit for left 2nd toe swelling, redness, painful, no apparent trauma, no open wound, uncertain duration.     Hx of dementia, has been stable on Namenda in SNF. GERD has been stable, taking Omeprazole,   hx of dilatations for achalasia, treated for candidiasis, takes NIfedi[ine 10mg  tid, f/u GI prn. Blood pressure has been controlled on Metoprolol. Hx of LLE DVT related to elevated Plt , on Xarelto chronically. Hx of COPD, taking Breo Ellipta, Singulair daily, Albuterol HFA q6h prn, she is stable with occasional chronic hacking cough. Her mood is stable on Sertraline 25mg .   Past Medical History:  Diagnosis Date  . Abnormal weight gain 05/09/2012  . Achalasia of esophagus 09/18/2013   09/18/13 Nifedipine 10mg  tid ac meals. 11/17/14 [facility] f/u as neeed   . Anxiety and depression 03/12/2012   Increased Sertraline to 50mg  daily 07/16/13 08/26/13 Mirtazapine 7.5mg  qhs  10/12/14 Pharm decreased Sertraline to 25mg .  05/04/15 TSH 2.89 09/15/15 pharm taper off Sertraline.  10/08/15 continue Sertraline 25mg  daily, POA desires. 12/02/15 wbc 6.1, Hgb 12.0, plt 271    . Asthmatic bronchitis   . BACK PAIN, LUMBAR 03/12/2007   Hx of lower back pain, had X-ray of her lumbar spine 09/08/12--spondylosis-better controlled with Hydrocodone/ApAp  10/325mg  ac and hs  01/01/13 lumbar spine inj. Able to ambulate with walker on unit until her fall 06/22/13. 06/25/13 unremarkable. X-ray lumbar spine. 11/10/13 dc Oxycodone prn-not used in 60 days.     . COLONIC POLYPS 03/12/2007   Initially noted April 2002 colonoscopy. Followup colonoscopy 05/31/2006 did not disclose any further polyps.    Marland Kitchen COPD (chronic obstructive pulmonary disease) ([place])   . Coronary artery disease   . Deep vein thrombosis of left lower extremity ([place]) 07/19/2012  . Dementia 09/16/2012   05/04/15 TSH  2.89   . Depression   . Diverticulosis 05/10/2012  . DJD (degenerative joint disease)   . Dry skin dermatitis 11/18/2012  . Edema of leg 07/01/2010  . Epistaxis 01/24/2016   01/21/16: x2, held Xarelto x 1 day, Saline Nasal spray prn, wbc 9.5, Hgb 12.4, Plt 331, Na 138, K 4.5, Bun 25, creat 0.89 01/22/16 MWU13(24-40)  . Esophageal dysmotility 07/26/2013  . Esophageal  dysphagia 05/27/2010   New dx of achalasia 6/ 2015 09/18/13 Nifedipine 10mg  tid ac meals.    . Esophageal stricture   . Essential hypertension, benign 10/30/2013   12/02/15 wbc 6.1, Hgb 12.0, plt 271   . Essential thrombocythemia ([place]) 06/09/2009  . Fall 06/25/2013  . GERD (gastroesophageal reflux disease)   . Hip fracture ([place]) 09/08/2012  . HYPERCHOLESTEROLEMIA 03/12/2007   08/10/15 cholesterol 119, triglycerides 143, HDL 51, LDL 39   . Lumbar back pain   . MACULAR DEGENERATION 01/26/2008   Qualifier: Diagnosis of  By: Lenna Gilford MD, Deborra Medina   . Osteoarthritis 03/12/2007  . Osteoporosis 03/12/2007   Qualifier: Diagnosis of  By: Julien Girt CMA, Marliss Czar  06/15/14 dc Vit D 1000u po daily per pharm.    . Phlebitis and thrombophlebitis of other deep vessels of lower extremities 07/10/2010  . S/P balloon dilatation of esophageal stricture 07/28/2013   07/26/13. Dr. Henrene Pastor. EGD foreign removal and Balloon dilation of esophagus. Esophageal dysmotility, suspected achalasia, dilation at the GE junction to 87mm. Candida esophagitis.    . SYNCOPE 07/30/2009   Occurred 2011. Hospitalized. Felt due to dehydration.    . TRANSIENT ISCHEMIC ATTACKS, HX OF 03/12/2007   Qualifier: Diagnosis of  By: Julien Girt CMA, Marliss Czar     Past Surgical History:  Procedure Laterality Date  . ABDOMINAL HYSTERECTOMY     and bso  . APPENDECTOMY    . BOTOX INJECTION N/A 09/18/2013   Procedure: BOTOX INJECTION;  Surgeon: Lafayette Dragon, MD;  Location: [facility] END[facility]COPY;  Service: Endoscopy;  Laterality: N/A;  . BOTOX INJECTION N/A 10/16/2014   Procedure: BOTOX INJECTION;  Surgeon: Inda Castle, MD;  Location: [facility];  Service: Endoscopy;  Laterality: N/A;  . BOTOX INJECTION N/A 05/18/2016   Procedure: BOTOX INJECTION;  Surgeon: Milus Banister, MD;  Location: [facility];  Service: Endoscopy;  Laterality: N/A;  . CATARACT EXTRACTION W/ INTRAOCULAR LENS  IMPLANT, BILATERAL    . CHOLECYSTECTOMY    . ESOPHAGEAL MANOMETRY N/A 08/25/2013   Procedure:  ESOPHAGEAL MANOMETRY (EM);  Surgeon: Jerene Bears, MD;  Location: [facility];  Service: Gastroenterology;  Laterality: N/A;  . ESOPHAGOGASTRODUODENOSCOPY N/A 07/26/2013   Procedure: ESOPHAGOGASTRODUODENOSCOPY (EGD);  Surgeon: Irene Shipper, MD;  [facility];  Service: Endoscopy;  Laterality: N/A;  . ESOPHAGOGASTRODUODENOSCOPY N/A 10/16/2014   Procedure: ESOPHAGOGASTRODUODENOSCOPY (EGD);  Surgeon: Inda Castle, MD;  Location: [facility];  Service: Endoscopy;  Laterality: N/A;  with botox  . ESOPHAGOGASTRODUODENOSCOPY (EGD) WITH ESOPHAGEAL DILATION N/A 11/25/2012   Procedure: ESOPHAGOGASTRODUODENOSCOPY (EGD) WITH ESOPHAGEAL DILATION;  Surgeon: Jerene Bears, MD;  Location: [facility];  Service: Gastroenterology;  Laterality: N/A;  . ESOPHAGOGASTRODUODENOSCOPY (EGD) WITH PROPOFOL N/A 09/18/2013   Procedure: ESOPHAGOGASTRODUODENOSCOPY (EGD) WITH PROPOFOL;  Surgeon: Lafayette Dragon, MD;  Location: [facility];  Service: Endoscopy;  Laterality: N/A;  . ESOPHAGOGASTRODUODENOSCOPY (EGD) WITH PROPOFOL N/A 05/18/2016   Procedure: ESOPHAGOGASTRODUODENOSCOPY (EGD) WITH PROPOFOL;  Surgeon: Milus Banister, MD;  Location: [facility];  Service: Endoscopy;  Laterality: N/A;  . HIP ARTHROPLASTY Right 09/09/2012   Procedure: ARTHROPLASTY BIPOLAR HIP;  Surgeon: Mauri Pole, MD;  Location: [facility];  Service: Orthopedics;  Laterality: Right;  . TAH and BSO  08/21/1995    Allergies  Allergen Reactions  . Sulfamethoxazole Rash    Outpatient Encounter Prescriptions as of 06/08/2016  Medication Sig  . acetaminophen (TYLENOL) 325 MG tablet Take 650 mg by mouth every 4 (four) hours as needed for moderate pain.   Marland Kitchen albuterol (PROVENTIL HFA;VENTOLIN HFA) 108 (90 BASE) MCG/ACT inhaler Inhale 2 puffs into the lungs every 6 (six) hours as needed for wheezing or shortness of breath.  Marland Kitchen atorvastatin (LIPITOR) 20 MG tablet Take 20 mg by mouth at bedtime.   . CHLORHEXIDINE GLUCONATE, BULK, SOLN Take 5 mLs by  mouth daily. 5 ml swish and spit for 30 seconds every day indefinitely   . feeding supplement (RESOURCE BREEZE) LIQD Take 90 mLs by mouth 2 (two) times daily.   . fluticasone furoate-vilanterol (BREO ELLIPTA) 100-25 MCG/INH AEPB Inhale 1 puff into the lungs daily. One puff inhale once a day, rinse mouth after each use.   Marland Kitchen HYDROcodone-acetaminophen (NORCO) 10-325 MG per tablet Take 1 tablet by mouth 4 (four) times daily.  . hydroxyurea (HYDREA) 500 MG capsule Take 500-1,000 mg by mouth See admin instructions. Takes 1000 mg on Monday only Takes 500 mg on Sunday, Wednesday and Friday only  . memantine (NAMENDA) 10 MG tablet Take 10 mg by mouth 2 (two) times daily.   . metoprolol tartrate (LOPRESSOR) 25 MG tablet Take 25 mg by mouth 2 (two) times daily.  . montelukast (SINGULAIR) 10 MG tablet Take 10 mg by mouth at bedtime.  . Multiple Vitamins-Minerals (PRESERVISION AREDS 2) CAPS Take 1 capsule by mouth 2 (two) times daily.  Marland Kitchen NIFEdipine (PROCARDIA) 10 MG capsule Take 10 mg by mouth 3 (three) times daily. Squeeze contents of one capsule uner tongue 15-30 minutes before meals 3 times a day   . nitroGLYCERIN (NITROSTAT) 0.4 MG SL tablet Place 0.4 mg under the tongue every 5 (five) minutes as needed for chest pain.  Marland Kitchen omeprazole (PRILOSEC) 20 MG capsule Take 20 mg by mouth at bedtime.   . Rivaroxaban (XARELTO) 15 MG TABS tablet Take 15 mg by mouth every evening. 5pm  . sertraline (ZOLOFT) 25 MG tablet Take 25 mg by mouth at bedtime.   . sodium fluoride (DENTA 5000 PLUS) 1.1 % CREA dental cream Place 1 application onto teeth at bedtime.  . Humidifier MISC Please use humidifier to prevent excessive dryness.  . [DISCONTINUED] oxymetazoline (AFRIN) 0.05 % nasal spray Place 2 sprays into both nostrils 2 (two) times daily.  . [DISCONTINUED] sodium chloride (OCEAN) 0.65 % SOLN nasal spray Place 2 sprays into both nostrils 3 (three) times daily.   No facility-administered encounter medications on file as of  06/08/2016.     Review of Systems  Constitutional: Negative for activity change, appetite change, chills, diaphoresis, fatigue and fever.  HENT: Positive for hearing loss. Negative for congestion, ear discharge, ear pain, nosebleeds, sore throat and tinnitus.   Eyes: Negative for photophobia, pain, discharge and redness.  Respiratory: Positive for cough. Negative for shortness of breath (Morning are worse, but she remains dyspneic throughout the day.), wheezing and stridor.   Cardiovascular: Negative for chest pain, palpitations and leg swelling.       Hx of Left DVT 2014  Gastrointestinal: Negative for abdominal pain, blood in stool, constipation, diarrhea, nausea and vomiting.  Endocrine: Negative for polydipsia.  Genitourinary: Positive for frequency. Negative for dysuria, flank pain, hematuria and  urgency.  Musculoskeletal: Positive for back pain and gait problem. Negative for myalgias and neck pain.       Lower extremity weakness. [place] wheelchair.  Skin: Negative for rash.  Allergic/Immunologic: Negative for environmental allergies.  Neurological: Negative for dizziness, tremors, seizures, weakness and headaches.  Hematological: Does not bruise/bleed easily.       Thrombocythemia treated with hydroxyurea  Psychiatric/Behavioral: Positive for confusion. Negative for hallucinations and suicidal ideas. The patient is nervous/anxious (impoved on sertraline).     Immunization History  Administered Date(s) Administered  . Influenza Split 11/16/2010, 12/13/2011  . Influenza Whole 11/27/2009  . Influenza-Unspecified 12/31/2013, 11/18/2014, 12/14/2015  . PPD Test 09/11/2012  . Pneumococcal Polysaccharide-23 02/28/1988   Pertinent  Health Maintenance Due  Topic Date Due  . PNA vac Low Risk Adult (2 of 2 - PCV13) 02/27/1989  . INFLUENZA VACCINE  09/27/2016  . DEXA SCAN  Completed   Fall Risk  10/04/2015 12/28/2014 10/23/2014 11/24/2013  Falls in the past year? No Yes Yes Yes  Number  falls in past yr: - 1 1 2  or more  Injury with Fall? - No No -  Risk for fall due to : Impaired mobility;Impaired balance/gait History of fall(s);Impaired balance/gait;Impaired mobility Impaired mobility History of fall(s);Impaired balance/gait;Impaired vision;Impaired mobility  Follow up - Falls prevention discussed Falls evaluation completed -   Functional Status Survey:    Vitals:   06/08/16 1014  BP: 128/70  Pulse: 72  Resp: 18  Temp: 98.2 F (36.8 C)  Weight: 100 lb 1.6 oz (45.4 kg)  Height: 5\' 1"  (1.549 m)   Body mass index is 18.91 kg/m. Physical Exam  Constitutional: She is oriented to person, place, and time. She appears well-developed and well-nourished. No distress.  HENT:  Head: Normocephalic and atraumatic.  Hx of right nasal septal bleeding at Kiesselbach's plexus. Scarring fom cautery in this area.  Eyes: Conjunctivae and EOM are normal. Pupils are equal, round, and reactive to light.  Neck: Normal range of motion. Neck supple. No JVD present. No thyromegaly present.  Cardiovascular: Normal rate, regular rhythm and normal heart sounds.   Pulmonary/Chest: Effort normal. She has decreased breath sounds. She has no rales.  Decreased breath sounds Bilat. Lungs.   Abdominal: Soft. Bowel sounds are normal. There is no tenderness.  Musculoskeletal: Normal range of motion. She exhibits edema. She exhibits no tenderness.  Lower back-baseline: pain is well controlled with prn inj, narcotics, w/c  Left 2nd toe redness and swelling  Lymphadenopathy:    She has no cervical adenopathy.  Neurological: She is alert and oriented to person, place, and time. She has normal reflexes. No cranial nerve deficit. She exhibits normal muscle tone. Coordination normal.  8/ 26/16 BIMS score 14/15  Skin: Skin is warm and dry. No rash noted. She is not diaphoretic. No erythema (LLE).  The right hip surgical scar  Psychiatric: She has a normal mood and affect. Her affect is not angry, not  blunt, not labile and not inappropriate. Her speech is not delayed, not tangential and not slurred. She is hyperactive and slowed. She is not agitated, not aggressive, not withdrawn and not combative. Thought content is not paranoid and not delusional. Cognition and memory are impaired. She does not express impulsivity or inappropriate judgment. She exhibits abnormal recent memory.    Labs reviewed:  Recent Labs  01/21/16 02/10/16 1530 03/09/16  NA 138 139 143  K 4.5 4.3 3.9  CL  --  101  --   GLUCOSE  --  113*  --   BUN 25* 24* 17  CREATININE 0.9 0.90 0.7    Recent Labs  03/09/16  AST 17  ALT 10  ALKPHOS 68    Recent Labs  01/21/16 02/10/16 1530 03/09/16 05/02/16  WBC 9.5  --  4.8 7.9  HGB 12.4 13.6 11.9* 11.1*  HCT 37 40.0 36 34*  PLT 331  --  245 274   Lab Results  Component Value Date   TSH 2.89 05/04/2015   Lab Results  Component Value Date   HGBA1C 5.5 10/16/2014   Lab Results  Component Value Date   CHOL 113 02/10/2016   HDL 42 02/10/2016   LDLCALC 43 02/10/2016   LDLDIRECT 152.3 06/04/2009   TRIG 138 02/10/2016   CHOLHDL 3 09/13/2011    Significant Diagnostic Results in last 30 days:  No results found.  Assessment/Plan Toe pain, left Left 2nd toe redness, swelling, pain, open toe shoes, aspercreme qid x 2 weeks, update CBC CMP uric acid, TSH  Essential hypertension, benign Controlled, continue Metoprolol 25mg  bid  Deep vein thrombosis of left lower extremity Switched to Xeralto since 09/12/12. Left leg  COPD with asthma ([place]) maintenance, takes Montelukast 10mg  daily, Breo Ellipta I daily inhaled, Ventolin HFA 2 puffs q6h prn.   Achalasia of esophagus 09/18/13 Nifedipine 10mg  tid ac meals. 11/17/14 [facility] f/u as neeed 05/18/16 endoscopy. Stable. Continue Omeprazole, Nifedipine.   Dementia 11/25/14 MMSE 28/30, continue Namenda.  Osteoarthritis Long standing spondylosis, prn inj and Norco 10/325 qid for pain control, w/c for mobility  except a few steps with walker sometimes  Essential thrombocythemia ([place]) F/u oncology, continue hydroxyurea 1000mg  q Monday, 500mg  Sun, Wed, Fri. 05/02/16 wbc 7.9, Hgb 11.1, plt 274  Anxiety and depression Her mood is stable, continue Sertraline 25mg  since 10/12/14([facility])     Family/ staff Communication: SNF  Labs/tests ordered:  CBC CMP uric acid TSH

## 2016-06-08 NOTE — Assessment & Plan Note (Addendum)
Controlled, continue Metoprolol 25mg  bid

## 2016-06-08 NOTE — Assessment & Plan Note (Signed)
Left 2nd toe redness, swelling, pain, open toe shoes, aspercreme qid x 2 weeks, update CBC CMP uric acid, TSH

## 2016-06-08 NOTE — Assessment & Plan Note (Signed)
11/25/14 MMSE 28/30, continue Namenda.

## 2016-06-08 NOTE — Assessment & Plan Note (Signed)
Long standing spondylosis, prn inj and Norco 10/325 qid for pain control, w/c for mobility except a few steps with walker sometimes

## 2016-06-13 DIAGNOSIS — I10 Essential (primary) hypertension: Secondary | ICD-10-CM | POA: Diagnosis not present

## 2016-06-13 DIAGNOSIS — Z7901 Long term (current) use of anticoagulants: Secondary | ICD-10-CM | POA: Diagnosis not present

## 2016-06-13 LAB — BASIC METABOLIC PANEL
BUN: 17 mg/dL (ref 4–21)
Creatinine: 0.7 mg/dL (ref <=1.1)
GLUCOSE: 82 mg/dL
Potassium: 4.2 mmol/L (ref 3.4–5.3)
Sodium: 139 mmol/L (ref 137–147)

## 2016-06-13 LAB — HEPATIC FUNCTION PANEL
ALT: 9 U/L (ref 7–35)
AST: 14 U/L (ref 13–35)
Alkaline Phosphatase: 63 U/L (ref 25–125)
BILIRUBIN, TOTAL: 0.4 mg/dL

## 2016-06-13 LAB — CBC AND DIFFERENTIAL
HEMATOCRIT: 31 % — AB (ref 36–46)
HEMOGLOBIN: 10.4 g/dL — AB (ref 12.0–16.0)
Platelets: 303 10*3/uL (ref 150–399)
WBC: 6 10*3/mL

## 2016-06-13 LAB — TSH: TSH: 2.8 u[IU]/mL (ref <=5.90)

## 2016-06-14 ENCOUNTER — Other Ambulatory Visit: Payer: Self-pay | Admitting: *Deleted

## 2016-06-19 DIAGNOSIS — J209 Acute bronchitis, unspecified: Secondary | ICD-10-CM | POA: Diagnosis not present

## 2016-06-19 DIAGNOSIS — D539 Nutritional anemia, unspecified: Secondary | ICD-10-CM | POA: Diagnosis not present

## 2016-06-19 DIAGNOSIS — K5793 Diverticulitis of intestine, part unspecified, without perforation or abscess with bleeding: Secondary | ICD-10-CM | POA: Diagnosis not present

## 2016-06-19 DIAGNOSIS — R2681 Unsteadiness on feet: Secondary | ICD-10-CM | POA: Diagnosis not present

## 2016-06-19 DIAGNOSIS — I2510 Atherosclerotic heart disease of native coronary artery without angina pectoris: Secondary | ICD-10-CM | POA: Diagnosis not present

## 2016-06-19 DIAGNOSIS — M6281 Muscle weakness (generalized): Secondary | ICD-10-CM | POA: Diagnosis not present

## 2016-06-19 DIAGNOSIS — S72009A Fracture of unspecified part of neck of unspecified femur, initial encounter for closed fracture: Secondary | ICD-10-CM | POA: Diagnosis not present

## 2016-06-19 DIAGNOSIS — K219 Gastro-esophageal reflux disease without esophagitis: Secondary | ICD-10-CM | POA: Diagnosis not present

## 2016-06-19 DIAGNOSIS — F341 Dysthymic disorder: Secondary | ICD-10-CM | POA: Diagnosis not present

## 2016-06-19 DIAGNOSIS — D649 Anemia, unspecified: Secondary | ICD-10-CM | POA: Diagnosis not present

## 2016-07-04 DIAGNOSIS — D649 Anemia, unspecified: Secondary | ICD-10-CM | POA: Diagnosis not present

## 2016-07-04 LAB — CBC AND DIFFERENTIAL
HEMATOCRIT: 32 % — AB (ref 36–46)
HEMOGLOBIN: 10.7 g/dL — AB (ref 12.0–16.0)
Platelets: 312 10*3/uL (ref 150–399)
WBC: 5.7 10*3/mL

## 2016-07-05 ENCOUNTER — Other Ambulatory Visit: Payer: Self-pay | Admitting: *Deleted

## 2016-07-10 ENCOUNTER — Telehealth: Payer: Self-pay | Admitting: *Deleted

## 2016-07-10 ENCOUNTER — Non-Acute Institutional Stay (SKILLED_NURSING_FACILITY): Payer: Medicare Other | Admitting: Nurse Practitioner

## 2016-07-10 ENCOUNTER — Encounter: Payer: Self-pay | Admitting: Nurse Practitioner

## 2016-07-10 DIAGNOSIS — K224 Dyskinesia of esophagus: Secondary | ICD-10-CM | POA: Diagnosis not present

## 2016-07-10 DIAGNOSIS — F0150 Vascular dementia without behavioral disturbance: Secondary | ICD-10-CM | POA: Diagnosis not present

## 2016-07-10 DIAGNOSIS — J449 Chronic obstructive pulmonary disease, unspecified: Secondary | ICD-10-CM | POA: Diagnosis not present

## 2016-07-10 DIAGNOSIS — M150 Primary generalized (osteo)arthritis: Secondary | ICD-10-CM | POA: Diagnosis not present

## 2016-07-10 DIAGNOSIS — M79675 Pain in left toe(s): Secondary | ICD-10-CM

## 2016-07-10 DIAGNOSIS — F32A Depression, unspecified: Secondary | ICD-10-CM

## 2016-07-10 DIAGNOSIS — I10 Essential (primary) hypertension: Secondary | ICD-10-CM | POA: Diagnosis not present

## 2016-07-10 DIAGNOSIS — D473 Essential (hemorrhagic) thrombocythemia: Secondary | ICD-10-CM

## 2016-07-10 DIAGNOSIS — F329 Major depressive disorder, single episode, unspecified: Secondary | ICD-10-CM | POA: Diagnosis not present

## 2016-07-10 DIAGNOSIS — F419 Anxiety disorder, unspecified: Secondary | ICD-10-CM | POA: Diagnosis not present

## 2016-07-10 DIAGNOSIS — M159 Polyosteoarthritis, unspecified: Secondary | ICD-10-CM

## 2016-07-10 DIAGNOSIS — M8949 Other hypertrophic osteoarthropathy, multiple sites: Secondary | ICD-10-CM

## 2016-07-10 DIAGNOSIS — I82402 Acute embolism and thrombosis of unspecified deep veins of left lower extremity: Secondary | ICD-10-CM | POA: Diagnosis not present

## 2016-07-10 NOTE — Assessment & Plan Note (Addendum)
Resolved. 06/13/16 Na 139, K 4.2, Bun 17, creat 0.69, TSH 2.8, uric acid 4.0, wbc 6.1, Hgb 10.4, plt 303

## 2016-07-10 NOTE — Assessment & Plan Note (Addendum)
Controlled, continue Metoprolol 25mg  bid, 06/13/16 Na 139, K 4.2, Bun 17, creat 0.69, TSH 2.8, uric acid 4.0, wbc 6.1, Hgb 10.4, plt 303

## 2016-07-10 NOTE — Assessment & Plan Note (Signed)
F/u oncology, continue hydroxyurea 1000mg  q Monday, 500mg  Sun, Wed, Fri, 07/04/16 plt 312

## 2016-07-10 NOTE — Assessment & Plan Note (Signed)
Stable, maintenance, takes Montelukast 10mg  daily, Breo Ellipta I daily inhaled, Ventolin HFA 2 puffs q6h prn.

## 2016-07-10 NOTE — Assessment & Plan Note (Signed)
Switched to [place] since 09/12/12. Left leg

## 2016-07-10 NOTE — Assessment & Plan Note (Signed)
Her mood is stable, continue Sertraline 25mg  since 10/12/14(GDR)

## 2016-07-10 NOTE — Assessment & Plan Note (Signed)
09/18/13 Nifedipine 10mg  tid ac meals. 11/17/14 [facility] f/u as neeed 05/18/16 endoscopy. Stable. Continue Omeprazole, Nifedipine.

## 2016-07-10 NOTE — Progress Notes (Signed)
Location:  [facility] Room Number: 35 Place of Service:  SNF (31) Provider:  Mayer Vondrak, Manxie  NP  Estill Dooms, MD  Patient Care Team: Estill Dooms, MD as PCP - General (Internal Medicine) Estill Dooms, MD as PCP - Internal Medicine (Internal Medicine) Annia Belt, MD as Consulting Physician (Hematology and Oncology) Suella Broad, MD as Consulting Physician (Physical Medicine and Rehabilitation) Noralee Space, MD (Pulmonary Disease) [place] Jaimin Krupka X, NP as Nurse Practitioner (Nurse Practitioner) Inda Castle, MD as Consulting Physician (Gastroenterology)  Extended Emergency Contact Information Primary Emergency Contact: Henle,Gene Address: [address]          [place], [place] [address] [place] Phone: [phone] Work Phone: [phone] Mobile Phone: [phone] Relation: Son Secondary Emergency Contact: [name], [place] Phone: [phone] Relation: Son  Code Status:  DNR Goals of care: Advanced Directive information Advanced Directives 07/10/2016  Does Patient Have a Medical Advance Directive? Yes  Type of Paramedic of [place];Living will;Out of facility DNR (pink MOST or yellow form)  Does patient want to make changes to medical advance directive? No - Patient declined  Copy of [place] in Chart? Yes  Pre-existing out of facility DNR order (yellow form or pink MOST form) Yellow form placed in chart (order not valid for inpatient use)     Chief Complaint  Patient presents with  . Medical Management of Chronic Issues    HPI:  Pt is a 83 y.o. female seen today for medical management of chronic diseases.     Hx of dementia, has been stable on Namenda in SNF. GERD has been stable, taking Omeprazole, hx of dilatations for achalasia, treated for candidiasis, takes NIfedi[ine  10mg  tid, f/u GI prn. Blood pressure has been controlled on Metoprolol 25mg  bid. Hx of LLE DVT related to elevated Plt , on Xarelto chronically. Hx of COPD, taking Breo Ellipta, Singulair daily, Albuterol HFA q6h prn, she is stable with occasional chronic hacking cough. Her mood is stable on Sertraline 25mg . Chronic back pain is managed with Norco 10/325mg  tid    Past Medical History:  Diagnosis Date  . Abnormal weight gain 05/09/2012  . Achalasia of esophagus 09/18/2013   09/18/13 Nifedipine 10mg  tid ac meals. 11/17/14 [facility] f/u as neeed   . Anxiety and depression 03/12/2012   Increased Sertraline to 50mg  daily 07/16/13 08/26/13 Mirtazapine 7.5mg  qhs  10/12/14 Pharm decreased Sertraline to 25mg .  05/04/15 TSH 2.89 09/15/15 pharm taper off Sertraline.  10/08/15 continue Sertraline 25mg  daily, POA desires. 12/02/15 wbc 6.1, Hgb 12.0, plt 271    . Asthmatic bronchitis   . BACK PAIN, LUMBAR 03/12/2007   Hx of lower back pain, had X-ray of her lumbar spine 09/08/12--spondylosis-better controlled with Hydrocodone/ApAp  10/325mg  ac and hs  01/01/13 lumbar spine inj. Able to ambulate with walker on unit until her fall 06/22/13. 06/25/13 unremarkable. X-ray lumbar spine. 11/10/13 dc Oxycodone prn-not used in 60 days.     . COLONIC POLYPS 03/12/2007   Initially noted April 2002 colonoscopy. Followup colonoscopy 05/31/2006 did not disclose any further polyps.    Marland Kitchen COPD (chronic obstructive pulmonary disease) ([place])   . Coronary artery disease   . Deep vein thrombosis of left lower extremity ([place]) 07/19/2012  . Dementia 09/16/2012   05/04/15 TSH 2.89   . Depression   .  Diverticulosis 05/10/2012  . DJD (degenerative joint disease)   . Dry skin dermatitis 11/18/2012  . Edema of leg 07/01/2010  . Epistaxis 01/24/2016   01/21/16: x2, held Xarelto x 1 day, Saline Nasal spray prn, wbc 9.5, Hgb 12.4, Plt 331, Na 138, K 4.5, Bun 25, creat 0.89 01/22/16 [id number](11-91)  . Esophageal dysmotility 07/26/2013  . Esophageal dysphagia  05/27/2010   New dx of achalasia 6/ 2015 09/18/13 Nifedipine 10mg  tid ac meals.    . Esophageal stricture   . Essential hypertension, benign 10/30/2013   12/02/15 wbc 6.1, Hgb 12.0, plt 271   . Essential thrombocythemia ([place]) 06/09/2009  . Fall 06/25/2013  . GERD (gastroesophageal reflux disease)   . Hip fracture ([place]) 09/08/2012  . HYPERCHOLESTEROLEMIA 03/12/2007   08/10/15 cholesterol 119, triglycerides 143, HDL 51, LDL 39   . Lumbar back pain   . MACULAR DEGENERATION 01/26/2008   Qualifier: Diagnosis of  By: Lenna Gilford MD, Deborra Medina   . Osteoarthritis 03/12/2007  . Osteoporosis 03/12/2007   Qualifier: Diagnosis of  By: Julien Girt CMA, Marliss Czar  06/15/14 dc Vit D 1000u po daily per pharm.    . Phlebitis and thrombophlebitis of other deep vessels of lower extremities 07/10/2010  . S/P balloon dilatation of esophageal stricture 07/28/2013   07/26/13. Dr. Henrene Pastor. EGD foreign removal and Balloon dilation of esophagus. Esophageal dysmotility, suspected achalasia, dilation at the GE junction to 72mm. Candida esophagitis.    . SYNCOPE 07/30/2009   Occurred 2011. Hospitalized. Felt due to dehydration.    . TRANSIENT ISCHEMIC ATTACKS, HX OF 03/12/2007   Qualifier: Diagnosis of  By: Julien Girt CMA, Marliss Czar     Past Surgical History:  Procedure Laterality Date  . ABDOMINAL HYSTERECTOMY     and bso  . APPENDECTOMY    . BOTOX INJECTION N/A 09/18/2013   Procedure: BOTOX INJECTION;  Surgeon: Lafayette Dragon, MD;  Location: [facility];  Service: Endoscopy;  Laterality: N/A;  . BOTOX INJECTION N/A 10/16/2014   Procedure: BOTOX INJECTION;  Surgeon: Inda Castle, MD;  Location: [facility];  Service: Endoscopy;  Laterality: N/A;  . BOTOX INJECTION N/A 05/18/2016   Procedure: BOTOX INJECTION;  Surgeon: Milus Banister, MD;  Location: [facility];  Service: Endoscopy;  Laterality: N/A;  . CATARACT EXTRACTION W/ INTRAOCULAR LENS  IMPLANT, BILATERAL    . CHOLECYSTECTOMY    . ESOPHAGEAL MANOMETRY N/A 08/25/2013   Procedure: ESOPHAGEAL  MANOMETRY (EM);  Surgeon: Jerene Bears, MD;  Location: [facility];  Service: Gastroenterology;  Laterality: N/A;  . ESOPHAGOGASTRODUODENOSCOPY N/A 07/26/2013   Procedure: ESOPHAGOGASTRODUODENOSCOPY (EGD);  Surgeon: Irene Shipper, MD;  Location[facility] ENDOSCOPY;  Service: Endoscopy;  Laterality: N/A;  . ESOPHAGOGASTRODUODENOSCOPY N/A 10/16/2014   Procedure: ESOPHAGOGASTRODUODENOSCOPY (EGD);  Surgeon: Inda Castle, MD;  Location: [facility];  Service: Endoscopy;  Laterality: N/A;  with botox  . ESOPHAGOGASTRODUODENOSCOPY (EGD) WITH ESOPHAGEAL DILATION N/A 11/25/2012   Procedure: ESOPHAGOGASTRODUODENOSCOPY (EGD) WITH ESOPHAGEAL DILATION;  Surgeon: Jerene Bears, MD;  Location: [facility];  Service: Gastroenterology;  Laterality: N/A;  . ESOPHAGOGASTRODUODENOSCOPY (EGD) WITH PROPOFOL N/A 09/18/2013   Procedure: ESOPHAGOGASTRODUODENOSCOPY (EGD) WITH PROPOFOL;  Surgeon: Lafayette Dragon, MD;  Location: [facility];  Service: Endoscopy;  Laterality: N/A;  . ESOPHAGOGASTRODUODENOSCOPY (EGD) WITH PROPOFOL N/A 05/18/2016   Procedure: ESOPHAGOGASTRODUODENOSCOPY (EGD) WITH PROPOFOL;  Surgeon: Milus Banister, MD;  Location: [facility];  Service: Endoscopy;  Laterality: N/A;  . HIP ARTHROPLASTY Right 09/09/2012   Procedure: ARTHROPLASTY BIPOLAR HIP;  Surgeon: Mauri Pole, MD;  Location:  [facility] ORS;  Service: Orthopedics;  Laterality: Right;  . TAH and BSO  08/21/1995    Allergies  Allergen Reactions  . Sulfamethoxazole Rash    Outpatient Encounter Prescriptions as of 07/10/2016  Medication Sig  . acetaminophen (TYLENOL) 325 MG tablet Take 650 mg by mouth every 4 (four) hours as needed for moderate pain.   Marland Kitchen albuterol (PROVENTIL HFA;VENTOLIN HFA) 108 (90 BASE) MCG/ACT inhaler Inhale 2 puffs into the lungs every 6 (six) hours as needed for wheezing or shortness of breath.  Marland Kitchen atorvastatin (LIPITOR) 20 MG tablet Take 20 mg by mouth at bedtime.   . CHLORHEXIDINE GLUCONATE, BULK, SOLN Take 5 mLs by mouth daily. 5  ml swish and spit for 30 seconds every day indefinitely   . feeding supplement (RESOURCE BREEZE) LIQD Take 90 mLs by mouth 2 (two) times daily.   . fluticasone furoate-vilanterol (BREO ELLIPTA) 100-25 MCG/INH AEPB Inhale 1 puff into the lungs daily. One puff inhale once a day, rinse mouth after each use.   . Humidifier MISC Please use humidifier to prevent excessive dryness.  Marland Kitchen HYDROcodone-acetaminophen (NORCO) 10-325 MG per tablet Take 1 tablet by mouth 4 (four) times daily.  . hydroxyurea (HYDREA) 500 MG capsule Take 500-1,000 mg by mouth See admin instructions. Takes 1000 mg on Monday only Takes 500 mg on Sunday, Wednesday and Friday only  . memantine (NAMENDA) 10 MG tablet Take 10 mg by mouth 2 (two) times daily.   . metoprolol tartrate (LOPRESSOR) 25 MG tablet Take 25 mg by mouth 2 (two) times daily.  . montelukast (SINGULAIR) 10 MG tablet Take 10 mg by mouth at bedtime.  . Multiple Vitamins-Minerals (PRESERVISION AREDS 2) CAPS Take 1 capsule by mouth 2 (two) times daily.  Marland Kitchen NIFEdipine (PROCARDIA) 10 MG capsule Take 10 mg by mouth 3 (three) times daily. Squeeze contents of one capsule uner tongue 15-30 minutes before meals 3 times a day   . nitroGLYCERIN (NITROSTAT) 0.4 MG SL tablet Place 0.4 mg under the tongue every 5 (five) minutes as needed for chest pain.  Marland Kitchen omeprazole (PRILOSEC) 20 MG capsule Take 20 mg by mouth at bedtime.   . Rivaroxaban (XARELTO) 15 MG TABS tablet Take 15 mg by mouth every evening. 5pm  . sertraline (ZOLOFT) 25 MG tablet Take 25 mg by mouth at bedtime.   . sodium fluoride (DENTA 5000 PLUS) 1.1 % CREA dental cream Place 1 application onto teeth at bedtime.   No facility-administered encounter medications on file as of 07/10/2016.     Review of Systems  Constitutional: Negative for activity change, appetite change, chills, diaphoresis, fatigue and fever.  HENT: Positive for hearing loss. Negative for congestion, ear discharge, ear pain, nosebleeds, sore throat and  tinnitus.   Eyes: Negative for photophobia, pain, discharge and redness.  Respiratory: Positive for cough. Negative for shortness of breath (Morning are worse, but she remains dyspneic throughout the day.), wheezing and stridor.   Cardiovascular: Negative for chest pain, palpitations and leg swelling.       Hx of Left DVT 2014  Gastrointestinal: Negative for abdominal pain, blood in stool, constipation, diarrhea, nausea and vomiting.  Endocrine: Negative for polydipsia.  Genitourinary: Positive for frequency. Negative for dysuria, flank pain, hematuria and urgency.  Musculoskeletal: Positive for back pain and gait problem. Negative for myalgias and neck pain.       Lower extremity weakness. [place] wheelchair.  Skin: Negative for rash.  Allergic/Immunologic: Negative for environmental allergies.  Neurological: Negative for dizziness, tremors, seizures, weakness and  headaches.  Hematological: Does not bruise/bleed easily.       Thrombocythemia treated with hydroxyurea  Psychiatric/Behavioral: Positive for confusion. Negative for hallucinations and suicidal ideas. The patient is nervous/anxious (impoved on sertraline).     Immunization History  Administered Date(s) Administered  . Influenza Split 11/16/2010, 12/13/2011  . Influenza Whole 11/27/2009  . Influenza-Unspecified 12/31/2013, 11/18/2014, 12/14/2015  . PPD Test 09/11/2012  . Pneumococcal Polysaccharide-23 02/28/1988   Pertinent  Health Maintenance Due  Topic Date Due  . PNA vac Low Risk Adult (2 of 2 - PCV13) 02/27/1989  . INFLUENZA VACCINE  09/27/2016  . DEXA SCAN  Completed   Fall Risk  10/04/2015 12/28/2014 10/23/2014 11/24/2013  Falls in the past year? No Yes Yes Yes  Number falls in past yr: - 1 1 2  or more  Injury with Fall? - No No -  Risk for fall due to : Impaired mobility;Impaired balance/gait History of fall(s);Impaired balance/gait;Impaired mobility Impaired mobility History of fall(s);Impaired balance/gait;Impaired  vision;Impaired mobility  Follow up - Falls prevention discussed Falls evaluation completed -   Functional Status Survey:    Vitals:   07/10/16 1235  BP: 136/66  Pulse: 70  Resp: 16  Temp: 97.5 F (36.4 C)  Weight: 99 lb 14.4 oz (45.3 kg)  Height: 5\' 1"  (1.549 m)   Body mass index is 18.88 kg/m. Physical Exam  Constitutional: She appears well-developed and well-nourished. No distress.  HENT:  Head: Normocephalic and atraumatic.  Hx of right nasal septal bleeding at Kiesselbach's plexus. Scarring fom cautery in this area.  Eyes: Conjunctivae and EOM are normal. Pupils are equal, round, and reactive to light.  Neck: Normal range of motion. Neck supple. No JVD present. No thyromegaly present.  Cardiovascular: Normal rate, regular rhythm and normal heart sounds.   Pulmonary/Chest: Effort normal. She has decreased breath sounds. She has no rales.  Decreased breath sounds Bilat. Lungs.   Abdominal: Soft. Bowel sounds are normal. There is no tenderness.  Musculoskeletal: Normal range of motion. She exhibits edema. She exhibits no tenderness.  Lower back-baseline: pain is well controlled with prn inj, narcotics, w/c    Lymphadenopathy:    She has no cervical adenopathy.  Neurological: She is alert. She has normal reflexes. No cranial nerve deficit. She exhibits normal muscle tone. Coordination normal.  8/ 26/16 BIMS score 14/15  Skin: Skin is warm and dry. No rash noted. She is not diaphoretic. No erythema (LLE).  The right hip surgical scar  Psychiatric: She has a normal mood and affect. Her affect is not angry, not blunt, not labile and not inappropriate. Her speech is not delayed, not tangential and not slurred. She is hyperactive and slowed. She is not agitated, not aggressive, not withdrawn and not combative. Thought content is not paranoid and not delusional. Cognition and memory are impaired. She does not express impulsivity or inappropriate judgment. She exhibits abnormal recent  memory.    Labs reviewed:  Recent Labs  02/10/16 1530 03/09/16 06/13/16  NA 139 143 139  K 4.3 3.9 4.2  CL 101  --   --   GLUCOSE 113*  --   --   BUN 24* 17 17  CREATININE 0.90 0.7 0.7    Recent Labs  03/09/16 06/13/16  AST 17 14  ALT 10 9  ALKPHOS 68 63    Recent Labs  05/02/16 06/13/16 07/04/16  WBC 7.9 6.0 5.7  HGB 11.1* 10.4* 10.7*  HCT 34* 31* 32*  PLT 274 303 312   Lab  Results  Component Value Date   TSH 2.80 06/13/2016   Lab Results  Component Value Date   HGBA1C 5.5 10/16/2014   Lab Results  Component Value Date   CHOL 113 02/10/2016   HDL 42 02/10/2016   LDLCALC 43 02/10/2016   LDLDIRECT 152.3 06/04/2009   TRIG 138 02/10/2016   CHOLHDL 3 09/13/2011    Significant Diagnostic Results in last 30 days:  No results found.  Assessment/Plan Essential hypertension, benign Controlled, continue Metoprolol 25mg  bid, 06/13/16 Na 139, K 4.2, Bun 17, creat 0.69, TSH 2.8, uric acid 4.0, wbc 6.1, Hgb 10.4, plt 303  Deep vein thrombosis of left lower extremity Switched to [place] since 09/12/12. Left leg  COPD with asthma ([place]) Stable, maintenance, takes Montelukast 10mg  daily, Breo Ellipta I daily inhaled, Ventolin HFA 2 puffs q6h prn.   Esophageal dysmotility 09/18/13 Nifedipine 10mg  tid ac meals. 11/17/14 [facility] f/u as neeed 05/18/16 endoscopy. Stable. Continue Omeprazole, Nifedipine.   Dementia 11/25/14 MMSE 28/30, continue Namenda.  Osteoarthritis Long standing spondylosis, prn inj and Norco 10/325 qid for pain control, w/c for mobility except a few steps with walker sometimes  Essential thrombocythemia ([place]) F/u oncology, continue hydroxyurea 1000mg  q Monday, 500mg  Sun, Wed, Fri, 07/04/16 plt 312  Anxiety and depression Her mood is stable, continue Sertraline 25mg  since 10/12/14([facility])   Toe pain, left Resolved. 06/13/16 Na 139, K 4.2, Bun 17, creat 0.69, TSH 2.8, uric acid 4.0, wbc 6.1, Hgb 10.4, plt 303     Family/ staff Communication:  SNF  Labs/tests ordered:  none

## 2016-07-10 NOTE — Telephone Encounter (Signed)
Received labs from [facility]. [place] - talked the nurse, Vania Rea; instructed "to continue current dose of Hydrea" per Dr Beryle Beams.. She voiced understanding. Labs to be scanned.

## 2016-07-10 NOTE — Assessment & Plan Note (Signed)
Long standing spondylosis, prn inj and Norco 10/325 qid for pain control, w/c for mobility except a few steps with walker sometimes

## 2016-07-10 NOTE — Assessment & Plan Note (Signed)
11/25/14 MMSE 28/30, continue Namenda.

## 2016-07-28 NOTE — Addendum Note (Signed)
Addendum  created 07/28/16 1248 by Iain Sawchuk, MD   Sign clinical note    

## 2016-08-10 DIAGNOSIS — E782 Mixed hyperlipidemia: Secondary | ICD-10-CM | POA: Diagnosis not present

## 2016-08-10 LAB — LIPID PANEL
Cholesterol: 118 (ref 0–200)
HDL: 46 (ref 35–70)
LDL CALC: 51
LDl/HDL Ratio: 2.6
Triglycerides: 128 (ref 40–160)

## 2016-08-15 ENCOUNTER — Other Ambulatory Visit: Payer: Self-pay | Admitting: *Deleted

## 2016-08-17 ENCOUNTER — Encounter: Payer: Self-pay | Admitting: Internal Medicine

## 2016-08-17 ENCOUNTER — Non-Acute Institutional Stay (SKILLED_NURSING_FACILITY): Payer: Medicare Other | Admitting: Internal Medicine

## 2016-08-17 DIAGNOSIS — I82402 Acute embolism and thrombosis of unspecified deep veins of left lower extremity: Secondary | ICD-10-CM | POA: Diagnosis not present

## 2016-08-17 DIAGNOSIS — F329 Major depressive disorder, single episode, unspecified: Secondary | ICD-10-CM

## 2016-08-17 DIAGNOSIS — F341 Dysthymic disorder: Secondary | ICD-10-CM

## 2016-08-17 DIAGNOSIS — E782 Mixed hyperlipidemia: Secondary | ICD-10-CM

## 2016-08-17 DIAGNOSIS — J449 Chronic obstructive pulmonary disease, unspecified: Secondary | ICD-10-CM

## 2016-08-17 DIAGNOSIS — I2510 Atherosclerotic heart disease of native coronary artery without angina pectoris: Secondary | ICD-10-CM

## 2016-08-17 NOTE — Progress Notes (Signed)
Location:  [facility] Room Number: 35 Place of Service:  SNF [phone]) Provider:  Glorene Leitzke [name], Shamara Soza MD  Patient Care Team: Blanchie Serve, MD as PCP - General (Internal Medicine) Annia Belt, MD as Consulting Physician (Hematology and Oncology) Suella Broad, MD as Consulting Physician (Physical Medicine and Rehabilitation) Noralee Space, MD (Pulmonary Disease) [name], [name], NP as Nurse Practitioner (Nurse Practitioner) Inda Castle, MD as Consulting Physician (Gastroenterology)  Extended Emergency Contact Information Primary Emergency Contact: Filter,Gene Address: [address]          [place], [place] [address] [place] Phone: [phone] Work Phone: [phone] Mobile Phone: [phone] Relation: Son Secondary Emergency Contact: [name], [place] Phone: [phone] Relation: Son  Code Status:  DNR Goals of care: Advanced Directive information Advanced Directives 07/10/2016  Does Patient Have a Medical Advance Directive? Yes  Type of Paramedic of [place];Living will;Out of facility DNR (pink MOST or yellow form)  Does patient want to make changes to medical advance directive? No - Patient declined  Copy of [place] in Chart? Yes  Pre-existing out of facility DNR order (yellow form or pink MOST form) Yellow form placed in chart (order not valid for inpatient use)     Chief Complaint  Patient presents with  . Medical Management of Chronic Issues    Routine Visit    HPI:  Pt is a 83 y.o. female seen today for medical management of chronic diseases.  She denies any concern. She is pleasantly confused. Her appetite is fair. She is incontinent with her bowel and baldder. She is complaint with her medication.    Past Medical History:  Diagnosis Date  . Abnormal weight gain  05/09/2012  . Achalasia of esophagus 09/18/2013   09/18/13 Nifedipine 10mg  tid ac meals. 11/17/14 [facility] f/u as neeed   . Anxiety and depression 03/12/2012   Increased Sertraline to 50mg  daily 07/16/13 08/26/13 Mirtazapine 7.5mg  qhs  10/12/14 Pharm decreased Sertraline to 25mg .  05/04/15 TSH 2.89 09/15/15 pharm taper off Sertraline.  10/08/15 continue Sertraline 25mg  daily, POA desires. 12/02/15 wbc 6.1, Hgb 12.0, plt 271    . Asthmatic bronchitis   . BACK PAIN, LUMBAR 03/12/2007   Hx of lower back pain, had X-ray of her lumbar spine 09/08/12--spondylosis-better controlled with Hydrocodone/ApAp  10/325mg  ac and hs  01/01/13 lumbar spine inj. Able to ambulate with walker on unit until her fall 06/22/13. 06/25/13 unremarkable. X-ray lumbar spine. 11/10/13 dc Oxycodone prn-not used in 60 days.     . COLONIC POLYPS 03/12/2007   Initially noted April 2002 colonoscopy. Followup colonoscopy 05/31/2006 did not disclose any further polyps.    Marland Kitchen COPD (chronic obstructive pulmonary disease) ([place])   . Coronary artery disease   . Deep vein thrombosis of left lower extremity ([place]) 07/19/2012  . Dementia 09/16/2012   05/04/15 TSH 2.89   . Depression   . Diverticulosis 05/10/2012  . DJD (degenerative joint disease)   . Dry skin dermatitis 11/18/2012  . Edema of leg 07/01/2010  . Epistaxis 01/24/2016   01/21/16: x2, held Xarelto x 1 day, Saline Nasal spray prn, wbc 9.5, Hgb 12.4, Plt 331, Na 138, K 4.5, Bun 25, creat 0.89 01/22/16 YPP50(93-26)  . Esophageal dysmotility 07/26/2013  . Esophageal dysphagia 05/27/2010   New dx of achalasia 6/ 2015 09/18/13 Nifedipine  10mg  tid ac meals.    . Esophageal stricture   . Essential hypertension, benign 10/30/2013   12/02/15 wbc 6.1, Hgb 12.0, plt 271   . Essential thrombocythemia ([place]) 06/09/2009  . Fall 06/25/2013  . GERD (gastroesophageal reflux disease)   . Hip fracture ([place]) 09/08/2012  . HYPERCHOLESTEROLEMIA 03/12/2007   08/10/15 cholesterol 119, triglycerides 143, HDL 51, LDL 39   . Lumbar  back pain   . MACULAR DEGENERATION 01/26/2008   Qualifier: Diagnosis of  By: Lenna Gilford MD, Deborra Medina   . Osteoarthritis 03/12/2007  . Osteoporosis 03/12/2007   Qualifier: Diagnosis of  By: Julien Girt CMA, Marliss Czar  06/15/14 dc Vit D 1000u po daily per pharm.    . Phlebitis and thrombophlebitis of other deep vessels of lower extremities 07/10/2010  . S/P balloon dilatation of esophageal stricture 07/28/2013   07/26/13. Dr. Henrene Pastor. EGD foreign removal and Balloon dilation of esophagus. Esophageal dysmotility, suspected achalasia, dilation at the GE junction to 39mm. Candida esophagitis.    . SYNCOPE 07/30/2009   Occurred 2011. Hospitalized. Felt due to dehydration.    . TRANSIENT ISCHEMIC ATTACKS, HX OF 03/12/2007   Qualifier: Diagnosis of  By: Julien Girt CMA, Marliss Czar     Past Surgical History:  Procedure Laterality Date  . ABDOMINAL HYSTERECTOMY     and bso  . APPENDECTOMY    . BOTOX INJECTION N/A 09/18/2013   Procedure: BOTOX INJECTION;  Surgeon: Lafayette Dragon, MD;  Location: [facility]COPY;  Service: Endoscopy;  Laterality: N/A;  . BOTOX INJECTION N/A 10/16/2014   Procedure: BOTOX INJECTION;  Surgeon: Inda Castle, MD;  Location: [facility];  Service: Endoscopy;  Laterality: N/A;  . BOTOX INJECTION N/A 05/18/2016   Procedure: BOTOX INJECTION;  Surgeon: Milus Banister, MD;  Location: [facility];  Service: Endoscopy;  Laterality: N/A;  . CATARACT EXTRACTION W/ INTRAOCULAR LENS  IMPLANT, BILATERAL    . CHOLECYSTECTOMY    . ESOPHAGEAL MANOMETRY N/A 08/25/2013   Procedure: ESOPHAGEAL MANOMETRY (EM);  Surgeon: Jerene Bears, MD;  Location: [facility];  Service: Gastroenterology;  Laterality: N/A;  . ESOPHAGOGASTRODUODENOSCOPY N/A 07/26/2013   Procedure: ESOPHAGOGASTRODUODENOSCOPY (EGD);  Surgeon: Irene Shipper, MD;  Location: [facility] ENDOSCOPY;  Service: Endoscopy;  Laterality: N/A;  . ESOPHAGOGASTRODUODENOSCOPY N/A 10/16/2014   Procedure: ESOPHAGOGASTRODUODENOSCOPY (EGD);  Surgeon: Inda Castle, MD;  Location: [facility]  END[facility];  Service: Endoscopy;  Laterality: N/A;  with botox  . ESOPHAGOGASTRODUODENOSCOPY (EGD) WITH ESOPHAGEAL DILATION N/A 11/25/2012   Procedure: ESOPHAGOGASTRODUODENOSCOPY (EGD) WITH ESOPHAGEAL DILATION;  Surgeon: Jerene Bears, MD;  Location: [facility];  Service: Gastroenterology;  Laterality: N/A;  . ESOPHAGOGASTRODUODENOSCOPY (EGD) WITH PROPOFOL N/A 09/18/2013   Procedure: ESOPHAGOGASTRODUODENOSCOPY (EGD) WITH PROPOFOL;  Surgeon: Lafayette Dragon, MD;  Location: [facility];  Service: Endoscopy;  Laterality: N/A;  . ESOPHAGOGASTRODUODENOSCOPY (EGD) WITH PROPOFOL N/A 05/18/2016   Procedure: ESOPHAGOGASTRODUODENOSCOPY (EGD) WITH PROPOFOL;  Surgeon: Milus Banister, MD;  Location: [facility];  Service: Endoscopy;  Laterality: N/A;  . HIP ARTHROPLASTY Right 09/09/2012   Procedure: ARTHROPLASTY BIPOLAR HIP;  Surgeon: Mauri Pole, MD;  Location: [facility]S;  Service: Orthopedics;  Laterality: Right;  . TAH and BSO  08/21/1995    Allergies  Allergen Reactions  . Sulfamethoxazole Rash    Outpatient Encounter Prescriptions as of 08/17/2016  Medication Sig  . acetaminophen (TYLENOL) 325 MG tablet Take 650 mg by mouth every 4 (four) hours as needed for moderate pain.   Marland Kitchen albuterol (PROVENTIL HFA;VENTOLIN HFA) 108 (90 BASE) MCG/ACT inhaler Inhale 2 puffs  into the lungs every 6 (six) hours as needed for wheezing or shortness of breath.  Marland Kitchen atorvastatin (LIPITOR) 20 MG tablet Take 20 mg by mouth at bedtime.   . CHLORHEXIDINE GLUCONATE, BULK, SOLN Take 5 mLs by mouth daily. 5 ml swish and spit for 30 seconds every day indefinitely   . feeding supplement (RESOURCE BREEZE) LIQD Take 90 mLs by mouth 2 (two) times daily.   . fluticasone furoate-vilanterol (BREO ELLIPTA) 100-25 MCG/INH AEPB Inhale 1 puff into the lungs daily. One puff inhale once a day, rinse mouth after each use.   . Humidifier MISC Please use humidifier to prevent excessive dryness.  Marland Kitchen HYDROcodone-acetaminophen (NORCO) 10-325 MG per  tablet Take 1 tablet by mouth 4 (four) times daily.  . hydroxyurea (HYDREA) 500 MG capsule Take 500-1,000 mg by mouth See admin instructions. Takes 1000 mg on Monday only Takes 500 mg on Sunday, Wednesday and Friday only  . memantine (NAMENDA) 10 MG tablet Take 10 mg by mouth 2 (two) times daily.   . metoprolol tartrate (LOPRESSOR) 25 MG tablet Take 25 mg by mouth 2 (two) times daily.  . montelukast (SINGULAIR) 10 MG tablet Take 10 mg by mouth at bedtime.  . Multiple Vitamins-Minerals (PRESERVISION AREDS 2) CAPS Take 1 capsule by mouth 2 (two) times daily.  Marland Kitchen NIFEdipine (PROCARDIA) 10 MG capsule Take 10 mg by mouth 3 (three) times daily. Squeeze contents of one capsule uner tongue 15-30 minutes before meals 3 times a day   . nitroGLYCERIN (NITROSTAT) 0.4 MG SL tablet Place 0.4 mg under the tongue every 5 (five) minutes as needed for chest pain.  Marland Kitchen omeprazole (PRILOSEC) 20 MG capsule Take 20 mg by mouth at bedtime.   . Rivaroxaban (XARELTO) 15 MG TABS tablet Take 15 mg by mouth every evening. 5pm  . sertraline (ZOLOFT) 25 MG tablet Take 25 mg by mouth at bedtime.   . sodium fluoride (DENTA 5000 PLUS) 1.1 % CREA dental cream Place 1 application onto teeth at bedtime.   No facility-administered encounter medications on file as of 08/17/2016.     Review of Systems  Constitutional: Negative for appetite change, chills, diaphoresis, fever and unexpected weight change.  HENT: Negative for congestion, mouth sores and rhinorrhea.   Eyes: Negative for visual disturbance.  Respiratory: Positive for choking. Negative for cough, shortness of breath and wheezing.        Has dysphagia and gets botox injections  Cardiovascular: Negative for chest pain and palpitations.  Gastrointestinal: Negative for abdominal pain, nausea and vomiting.  Genitourinary:       Urinary incontinence  Musculoskeletal: Negative for back pain.       No fall reported  Skin: Negative for rash and wound.  Neurological: Negative  for dizziness, tremors, light-headedness and headaches.  Psychiatric/Behavioral: Positive for confusion. Negative for behavioral problems.    Immunization History  Administered Date(s) Administered  . Influenza Split 11/16/2010, 12/13/2011  . Influenza Whole 11/27/2009  . Influenza-Unspecified 12/31/2013, 11/18/2014, 12/14/2015  . PPD Test 09/11/2012  . Pneumococcal Polysaccharide-23 02/28/1988   Pertinent  Health Maintenance Due  Topic Date Due  . PNA vac Low Risk Adult (2 of 2 - PCV13) 02/27/2017 (Originally 02/27/1989)  . INFLUENZA VACCINE  09/27/2016  . DEXA SCAN  Completed   Fall Risk  10/04/2015 12/28/2014 10/23/2014 11/24/2013  Falls in the past year? No Yes Yes Yes  Number falls in past yr: - 1 1 2  or more  Injury with Fall? - No No -  Risk for  fall due to : Impaired mobility;Impaired balance/gait History of fall(s);Impaired balance/gait;Impaired mobility Impaired mobility History of fall(s);Impaired balance/gait;Impaired vision;Impaired mobility  Follow up - Falls prevention discussed Falls evaluation completed -   Functional Status Survey:    Vitals:   08/17/16 1428  BP: 124/68  Pulse: 62  Resp: 18  Weight: 98 lb 4.8 oz (44.6 kg)  Height: 5\' 1"  (1.549 m)   Body mass index is 18.57 kg/m. Physical Exam  Constitutional: No distress.  Frail, elderly, thin built female  HENT:  Head: Normocephalic and atraumatic.  Mouth/Throat: Oropharynx is clear and moist.  Eyes: Conjunctivae are normal. Pupils are equal, round, and reactive to light.  Neck: Normal range of motion. Neck supple.  Cardiovascular: Normal rate and regular rhythm.   Pulmonary/Chest: Effort normal. She has no wheezes. She has no rales.  Decreased air entry to lung bases  Abdominal: Soft. Bowel sounds are normal. She exhibits no distension. There is no tenderness. There is no guarding.  Musculoskeletal: She exhibits no edema.  On wheelchair. Kyphosis and scoliosis present  Lymphadenopathy:    She has no  cervical adenopathy.  Neurological: She is alert.  Skin: Skin is warm and dry. She is not diaphoretic.  Oriented to person, place and time  Psychiatric: She has a normal mood and affect.    Labs reviewed:  Recent Labs  02/10/16 1530 03/09/16 06/13/16  NA 139 143 139  K 4.3 3.9 4.2  CL 101  --   --   GLUCOSE 113*  --   --   BUN 24* 17 17  CREATININE 0.90 0.7 0.7    Recent Labs  03/09/16 06/13/16  AST 17 14  ALT 10 9  ALKPHOS 68 63    Recent Labs  05/02/16 06/13/16 07/04/16  WBC 7.9 6.0 5.7  HGB 11.1* 10.4* 10.7*  HCT 34* 31* 32*  PLT 274 303 312   Lab Results  Component Value Date   TSH 2.80 06/13/2016   Lab Results  Component Value Date   HGBA1C 5.5 10/16/2014   Lab Results  Component Value Date   CHOL 118 08/10/2016   HDL 46 08/10/2016   LDLCALC 51 08/10/2016   LDLDIRECT 152.3 06/04/2009   TRIG 128 08/10/2016   CHOLHDL 3 09/13/2011    Significant Diagnostic Results in last 30 days:  No results found.  Assessment/Plan  Coronary atherosclerosis Chest pain free. Continue metoprolol 25 mg bid, nifedipine 10 mg tid and atorvastatin with prn NTG.   Hyperlipidemia LDL at goal, continue 20 mg atorvastatin daily  Chronic depression Stable mood, continue sertraline 25 mg qhs  DVT Continue xarelto and monitor  Chronic asthma Breathing stable on room air, continue singulair, breo ellipta daily and prn albuterol inhaler   Family/ staff Communication: reviewed care plan with patient and charge nurse.    Labs/tests ordered: none   Blanchie Serve, MD Internal Medicine [facility] [address] [place], [place] [address] Cell Phone (Monday-Friday 8 am - 5 pm): [phone] On Call: [phone] and follow prompts after 5 pm and on weekends Office Phone: [phone] Office Fax: [phone]

## 2016-09-05 ENCOUNTER — Telehealth: Payer: Self-pay | Admitting: *Deleted

## 2016-09-05 ENCOUNTER — Other Ambulatory Visit: Payer: Self-pay | Admitting: *Deleted

## 2016-09-05 DIAGNOSIS — D649 Anemia, unspecified: Secondary | ICD-10-CM | POA: Diagnosis not present

## 2016-09-05 LAB — CBC AND DIFFERENTIAL
HCT: 33 — AB (ref 36–46)
Hemoglobin: 10.9 — AB (ref 12.0–16.0)
Platelets: 305 (ref 150–399)
WBC: 6.3

## 2016-09-05 NOTE — Telephone Encounter (Signed)
Received labs from [facility] - "lab stable- stay on current dose of Hydrea" per Dr Beryle Beams. Message relayed to [facility], LPN, at [facility]; repeated instructions.

## 2016-09-14 ENCOUNTER — Encounter: Payer: Self-pay | Admitting: Nurse Practitioner

## 2016-09-14 ENCOUNTER — Non-Acute Institutional Stay (SKILLED_NURSING_FACILITY): Payer: Medicare Other | Admitting: Nurse Practitioner

## 2016-09-14 DIAGNOSIS — F329 Major depressive disorder, single episode, unspecified: Secondary | ICD-10-CM | POA: Diagnosis not present

## 2016-09-14 DIAGNOSIS — F0150 Vascular dementia without behavioral disturbance: Secondary | ICD-10-CM | POA: Diagnosis not present

## 2016-09-14 DIAGNOSIS — F419 Anxiety disorder, unspecified: Secondary | ICD-10-CM | POA: Diagnosis not present

## 2016-09-14 DIAGNOSIS — F32A Depression, unspecified: Secondary | ICD-10-CM

## 2016-09-14 DIAGNOSIS — M5440 Lumbago with sciatica, unspecified side: Secondary | ICD-10-CM | POA: Diagnosis not present

## 2016-09-14 NOTE — Progress Notes (Signed)
Location:  [facility] Room Number: 35 Place of Service:  SNF (31) Provider:  Kendell Gammon, [place]  NP  Blanchie Serve, MD  Patient Care Team: Blanchie Serve, MD as PCP - General (Internal Medicine) Sherri Belt, MD as Consulting Physician (Hematology and Oncology) Suella Broad, MD as Consulting Physician (Physical Medicine and Rehabilitation) Noralee Space, MD (Pulmonary Disease) [place] Oree Hislop X, NP as Nurse Practitioner (Nurse Practitioner) Inda Castle, MD as Consulting Physician (Gastroenterology)  Extended Emergency Contact Information Primary Emergency Contact: Cyran,Gene Address: [address]          [place], [place] [address] Johnnette Litter of [place] Phone: [phone] Work Phone: [phone] Mobile Phone: [phone] Relation: Son Secondary Emergency Contact: [name], [place] Phone: [phone] Relation: Son  Code Status:  DNR Goals of care: Advanced Directive information Advanced Directives 09/14/2016  Does Patient Have a Medical Advance Directive? Yes  Type of Paramedic of [place];Out of facility DNR (pink MOST or yellow form);Living will  Does patient want to make changes to medical advance directive? No - Patient declined  Copy of [place] in Chart? Yes  Pre-existing out of facility DNR order (yellow form or pink MOST form) Yellow form placed in chart (order not valid for inpatient use)     Chief Complaint  Patient presents with  . Medical Management of Chronic Issues    attempt to decrease Zoloft    HPI:  Pt is a 83 y.o. female seen today for GDR, the patient has Hx of depression, her mood is stable while taking Sertraline 25mg  qd, POA declined discontinuation in the past. She sleeps and eats well, no emotional outburst episodes.     Chronic back pain is managed with Norco 10/325mg  qid. She  takes Environmental manager for memory   Past Medical History:  Diagnosis Date  . Abnormal weight gain 05/09/2012  . Achalasia of esophagus 09/18/2013   09/18/13 Nifedipine 10mg  tid ac meals. 11/17/14 [facility] f/u as neeed   . Anxiety and depression 03/12/2012   Increased Sertraline to 50mg  daily 07/16/13 08/26/13 Mirtazapine 7.5mg  qhs  10/12/14 Pharm decreased Sertraline to 25mg .  05/04/15 TSH 2.89 09/15/15 pharm taper off Sertraline.  10/08/15 continue Sertraline 25mg  daily, POA desires. 12/02/15 wbc 6.1, Hgb 12.0, plt 271    . Asthmatic bronchitis   . BACK PAIN, LUMBAR 03/12/2007   Hx of lower back pain, had X-ray of her lumbar spine 09/08/12--spondylosis-better controlled with Hydrocodone/ApAp  10/325mg  ac and hs  01/01/13 lumbar spine inj. Able to ambulate with walker on unit until her fall 06/22/13. 06/25/13 unremarkable. X-ray lumbar spine. 11/10/13 dc Oxycodone prn-not used in 60 days.     . COLONIC POLYPS 03/12/2007   Initially noted April 2002 colonoscopy. Followup colonoscopy 05/31/2006 did not disclose any further polyps.    Marland Kitchen COPD (chronic obstructive pulmonary disease) ([place])   . Coronary artery disease   . Deep vein thrombosis of left lower extremity ([employer]) 07/19/2012  . Dementia 09/16/2012   05/04/15 TSH 2.89   . Depression   . Diverticulosis 05/10/2012  . DJD (degenerative joint disease)   . Dry skin dermatitis 11/18/2012  . Edema of leg 07/01/2010  . Epistaxis 01/24/2016   01/21/16: x2, held Xarelto x 1 day, Saline Nasal spray prn, wbc 9.5, Hgb 12.4, Plt 331, Na 138, K 4.5, Bun 25, creat 0.89 01/22/16 SAY30(16-01)  .  Esophageal dysmotility 07/26/2013  . Esophageal dysphagia 05/27/2010   New dx of achalasia 6/ 2015 09/18/13 Nifedipine 10mg  tid ac meals.    . Esophageal stricture   . Essential hypertension, benign 10/30/2013   12/02/15 wbc 6.1, Hgb 12.0, plt 271   . Essential thrombocythemia ([place]) 06/09/2009  . Fall 06/25/2013  . GERD (gastroesophageal reflux disease)   . Hip fracture ([place]) 09/08/2012  .  HYPERCHOLESTEROLEMIA 03/12/2007   08/10/15 cholesterol 119, triglycerides 143, HDL 51, LDL 39   . Lumbar back pain   . MACULAR DEGENERATION 01/26/2008   Qualifier: Diagnosis of  By: Lenna Gilford MD, Deborra Medina   . Osteoarthritis 03/12/2007  . Osteoporosis 03/12/2007   Qualifier: Diagnosis of  By: Julien Girt CMA, Marliss Czar  06/15/14 dc Vit D 1000u po daily per pharm.    . Phlebitis and thrombophlebitis of other deep vessels of lower extremities 07/10/2010  . S/P balloon dilatation of esophageal stricture 07/28/2013   07/26/13. Dr. Henrene Pastor. EGD foreign removal and Balloon dilation of esophagus. Esophageal dysmotility, suspected achalasia, dilation at the GE junction to 86mm. Candida esophagitis.    . SYNCOPE 07/30/2009   Occurred 2011. Hospitalized. Felt due to dehydration.    . TRANSIENT ISCHEMIC ATTACKS, HX OF 03/12/2007   Qualifier: Diagnosis of  By: Julien Girt CMA, Marliss Czar     Past Surgical History:  Procedure Laterality Date  . ABDOMINAL HYSTERECTOMY     and bso  . APPENDECTOMY    . BOTOX INJECTION N/A 09/18/2013   Procedure: BOTOX INJECTION;  Surgeon: Lafayette Dragon, MD;  Location: [facility];  Service: Endoscopy;  Laterality: N/A;  . BOTOX INJECTION N/A 10/16/2014   Procedure: BOTOX INJECTION;  Surgeon: Inda Castle, MD;  Location: [facility];  Service: Endoscopy;  Laterality: N/A;  . BOTOX INJECTION N/A 05/18/2016   Procedure: BOTOX INJECTION;  Surgeon: Milus Banister, MD;  Location: [facility];  Service: Endoscopy;  Laterality: N/A;  . CATARACT EXTRACTION W/ INTRAOCULAR LENS  IMPLANT, BILATERAL    . CHOLECYSTECTOMY    . ESOPHAGEAL MANOMETRY N/A 08/25/2013   Procedure: ESOPHAGEAL MANOMETRY (EM);  Surgeon: Jerene Bears, MD;  Location: [facility];  Service: Gastroenterology;  Laterality: N/A;  . ESOPHAGOGASTRODUODENOSCOPY N/A 07/26/2013   Procedure: ESOPHAGOGASTRODUODENOSCOPY (EGD);  Surgeon: Irene Shipper, MD;  Location: [facility] ENDOSCOPY;  Service: Endoscopy;  Laterality: N/A;  . ESOPHAGOGASTRODUODENOSCOPY N/A  10/16/2014   Procedure: ESOPHAGOGASTRODUODENOSCOPY (EGD);  Surgeon: Inda Castle, MD;  Location: [facility];  Service: Endoscopy;  Laterality: N/A;  with botox  . ESOPHAGOGASTRODUODENOSCOPY (EGD) WITH ESOPHAGEAL DILATION N/A 11/25/2012   Procedure: ESOPHAGOGASTRODUODENOSCOPY (EGD) WITH ESOPHAGEAL DILATION;  Surgeon: Jerene Bears, MD;  Location: [facility];  Service: Gastroenterology;  Laterality: N/A;  . ESOPHAGOGASTRODUODENOSCOPY (EGD) WITH PROPOFOL N/A 09/18/2013   Procedure: ESOPHAGOGASTRODUODENOSCOPY (EGD) WITH PROPOFOL;  Surgeon: Lafayette Dragon, MD;  Location: [facility];  Service: Endoscopy;  Laterality: N/A;  . ESOPHAGOGASTRODUODENOSCOPY (EGD) WITH PROPOFOL N/A 05/18/2016   Procedure: ESOPHAGOGASTRODUODENOSCOPY (EGD) WITH PROPOFOL;  Surgeon: Milus Banister, MD;  Location: [facility];  Service: Endoscopy;  Laterality: N/A;  . HIP ARTHROPLASTY Right 09/09/2012   Procedure: ARTHROPLASTY BIPOLAR HIP;  Surgeon: Mauri Pole, MD;  Location: [facility];  Service: Orthopedics;  Laterality: Right;  . TAH and BSO  08/21/1995    Allergies  Allergen Reactions  . Sulfamethoxazole Rash    Outpatient Encounter Prescriptions as of 09/14/2016  Medication Sig  . acetaminophen (TYLENOL) 325 MG tablet Take 650 mg by mouth every 4 (four) hours as needed  for moderate pain.   Marland Kitchen albuterol (PROVENTIL HFA;VENTOLIN HFA) 108 (90 BASE) MCG/ACT inhaler Inhale 2 puffs into the lungs every 6 (six) hours as needed for wheezing or shortness of breath.  Marland Kitchen atorvastatin (LIPITOR) 20 MG tablet Take 20 mg by mouth at bedtime.   . CHLORHEXIDINE GLUCONATE, BULK, SOLN Take 5 mLs by mouth daily. 5 ml swish and spit for 30 seconds every day indefinitely   . feeding supplement (RESOURCE BREEZE) LIQD Take 90 mLs by mouth 2 (two) times daily.   . fluticasone furoate-vilanterol (BREO ELLIPTA) 100-25 MCG/INH AEPB Inhale 1 puff into the lungs daily. One puff inhale once a day, rinse mouth after each use.   . Humidifier MISC Please  use humidifier to prevent excessive dryness.  Marland Kitchen HYDROcodone-acetaminophen (NORCO) 10-325 MG per tablet Take 1 tablet by mouth 4 (four) times daily.  . hydroxyurea (HYDREA) 500 MG capsule Take 500-1,000 mg by mouth See admin instructions. Takes 1000 mg on Monday only Takes 500 mg on Sunday, Wednesday and Friday only  . memantine (NAMENDA) 10 MG tablet Take 10 mg by mouth 2 (two) times daily.   . metoprolol tartrate (LOPRESSOR) 25 MG tablet Take 25 mg by mouth 2 (two) times daily.  . montelukast (SINGULAIR) 10 MG tablet Take 10 mg by mouth at bedtime.  . Multiple Vitamins-Minerals (PRESERVISION AREDS 2) CAPS Take 1 capsule by mouth 2 (two) times daily.  Marland Kitchen NIFEdipine (PROCARDIA) 10 MG capsule Take 10 mg by mouth 3 (three) times daily. Squeeze contents of one capsule uner tongue 15-30 minutes before meals 3 times a day   . nitroGLYCERIN (NITROSTAT) 0.4 MG SL tablet Place 0.4 mg under the tongue every 5 (five) minutes as needed for chest pain.  Marland Kitchen omeprazole (PRILOSEC) 20 MG capsule Take 20 mg by mouth at bedtime.   . Rivaroxaban (XARELTO) 15 MG TABS tablet Take 15 mg by mouth every evening. 5pm  . sertraline (ZOLOFT) 25 MG tablet Take 25 mg by mouth at bedtime.   . sodium fluoride (DENTA 5000 PLUS) 1.1 % CREA dental cream Place 1 application onto teeth at bedtime.   No facility-administered encounter medications on file as of 09/14/2016.     Review of Systems  Constitutional: Negative for appetite change, chills, diaphoresis, fever and unexpected weight change.  HENT: Negative for congestion and rhinorrhea.   Eyes: Negative for visual disturbance.  Respiratory: Positive for choking. Negative for cough.        Has dysphagia and gets botox injections  Cardiovascular: Negative for chest pain and palpitations.  Gastrointestinal: Negative for abdominal pain, nausea and vomiting.  Genitourinary:       Occasional urinary incontinent.   Musculoskeletal: Negative for back pain.       No fall reported,  transfer independently  Skin: Negative for rash.  Neurological: Negative for dizziness, light-headedness and headaches.  Psychiatric/Behavioral: Positive for confusion. Negative for behavioral problems.    Immunization History  Administered Date(s) Administered  . Influenza Split 11/16/2010, 12/13/2011  . Influenza Whole 11/27/2009  . Influenza-Unspecified 12/31/2013, 11/18/2014, 12/14/2015  . PPD Test 09/11/2012  . Pneumococcal Polysaccharide-23 02/28/1988   Pertinent  Health Maintenance Due  Topic Date Due  . PNA vac Low Risk Adult (2 of 2 - PCV13) 02/27/2017 (Originally 02/27/1989)  . INFLUENZA VACCINE  09/27/2016  . DEXA SCAN  Completed   Fall Risk  10/04/2015 12/28/2014 10/23/2014 11/24/2013  Falls in the past year? No Yes Yes Yes  Number falls in past yr: - 1 1 2  or  more  Injury with Fall? - No No -  Risk for fall due to : Impaired mobility;Impaired balance/gait History of fall(s);Impaired balance/gait;Impaired mobility Impaired mobility History of fall(s);Impaired balance/gait;Impaired vision;Impaired mobility  Follow up - Falls prevention discussed Falls evaluation completed -   Functional Status Survey:    Vitals:   09/14/16 1235  BP: 128/66  Pulse: 68  Resp: 18  Temp: 98.5 F (36.9 C)  Weight: 98 lb 8 oz (44.7 kg)  Height: 5\' 1"  (1.549 m)   Body mass index is 18.61 kg/m. Physical Exam  Constitutional: No distress.  Frail, elderly  HENT:  Head: Normocephalic and atraumatic.  Mouth/Throat: Oropharynx is clear and moist.  Eyes: Pupils are equal, round, and reactive to light. Conjunctivae are normal.  Neck: Normal range of motion. Neck supple.  Cardiovascular: Normal rate and regular rhythm.   Pulmonary/Chest: Effort normal. She has no wheezes. She has no rales.  Decreased air entry to lung bases  Abdominal: Soft. Bowel sounds are normal. She exhibits no distension. There is no tenderness. There is no guarding.  Musculoskeletal: She exhibits no edema.  On  wheelchair. Kyphosis and scoliosis present  Lymphadenopathy:    She has no cervical adenopathy.  Neurological: She is alert.  Skin: Skin is warm and dry. She is not diaphoretic.  Oriented to person, place and time  Psychiatric: She has a normal mood and affect.    Labs reviewed:  Recent Labs  02/10/16 1530 03/09/16 06/13/16  NA 139 143 139  K 4.3 3.9 4.2  CL 101  --   --   GLUCOSE 113*  --   --   BUN 24* 17 17  CREATININE 0.90 0.7 0.7    Recent Labs  03/09/16 06/13/16  AST 17 14  ALT 10 9  ALKPHOS 68 63    Recent Labs  06/13/16 07/04/16 09/05/16  WBC 6.0 5.7 6.3  HGB 10.4* 10.7* 10.9*  HCT 31* 32* 33*  PLT 303 312 305   Lab Results  Component Value Date   TSH 2.80 06/13/2016   Lab Results  Component Value Date   HGBA1C 5.5 10/16/2014   Lab Results  Component Value Date   CHOL 118 08/10/2016   HDL 46 08/10/2016   LDLCALC 51 08/10/2016   LDLDIRECT 152.3 06/04/2009   TRIG 128 08/10/2016   CHOLHDL 3 09/13/2011    Significant Diagnostic Results in last 30 days:  No results found.  Assessment/Plan Anxiety and depression 09/15/15 pharm taper off Sertraline.  10/08/15 continue Sertraline 25mg  daily, POA desires to continue.  09/14/16 Pharm GDR: may consider Sertraline 25mg  qod if POA consents.   BACK PAIN, LUMBAR Long standing spondylosis, prn inj and Norco 10/325 qid for pain control, w/c for mobility except a few steps with walker sometimes  Dementia 11/25/14 MMSE 28/30, continue Namenda.     Family/ staff Communication: SNF  Labs/tests ordered:  none

## 2016-09-14 NOTE — Assessment & Plan Note (Signed)
11/25/14 MMSE 28/30, continue Namenda.

## 2016-09-14 NOTE — Assessment & Plan Note (Signed)
09/15/15 pharm taper off Sertraline.  10/08/15 continue Sertraline 25mg  daily, POA desires to continue.  09/14/16 Pharm GDR: may consider Sertraline 25mg  qod if POA consents.

## 2016-09-14 NOTE — Assessment & Plan Note (Signed)
Long standing spondylosis, prn inj and Norco 10/325 qid for pain control, w/c for mobility except a few steps with walker sometimes

## 2016-09-21 DIAGNOSIS — Z79899 Other long term (current) drug therapy: Secondary | ICD-10-CM | POA: Diagnosis not present

## 2016-09-21 LAB — TSH: TSH: 2 (ref 0.41–5.90)

## 2016-09-25 ENCOUNTER — Encounter: Payer: Self-pay | Admitting: Nurse Practitioner

## 2016-09-25 ENCOUNTER — Non-Acute Institutional Stay (SKILLED_NURSING_FACILITY): Payer: Medicare Other | Admitting: Nurse Practitioner

## 2016-09-25 DIAGNOSIS — F0151 Vascular dementia with behavioral disturbance: Secondary | ICD-10-CM | POA: Diagnosis not present

## 2016-09-25 DIAGNOSIS — H1010 Acute atopic conjunctivitis, unspecified eye: Secondary | ICD-10-CM

## 2016-09-25 DIAGNOSIS — F01518 Vascular dementia, unspecified severity, with other behavioral disturbance: Secondary | ICD-10-CM

## 2016-09-25 DIAGNOSIS — J449 Chronic obstructive pulmonary disease, unspecified: Secondary | ICD-10-CM

## 2016-09-25 NOTE — Progress Notes (Signed)
Location:  [place] Room Number: 35 Place of Service:  SNF (31) Provider:  Icela Glymph, Manxie  NP  Blanchie Serve, MD  Patient Care Team: Blanchie Serve, MD as PCP - General (Internal Medicine) Annia Belt, MD as Consulting Physician (Hematology and Oncology) Suella Broad, MD as Consulting Physician (Physical Medicine and Rehabilitation) Noralee Space, MD (Pulmonary Disease) [place] Ryle Buscemi X, NP as Nurse Practitioner (Nurse Practitioner) Inda Castle, MD as Consulting Physician (Gastroenterology)  Extended Emergency Contact Information Primary Emergency Contact: Porchia,Gene Address: [address]          [place], [place] [address] Johnnette Litter of [place] Phone: [phone] Work Phone: [phone] Mobile Phone: [phone] Relation: Son Secondary Emergency Contact: [name], [place] of [place] Phone: [phone] Relation: Son  Code Status:  DNR Goals of care: Advanced Directive information Advanced Directives 09/25/2016  Does Patient Have a Medical Advance Directive? Yes  Type of Paramedic of [place];Out of facility DNR (pink MOST or yellow form)  Does patient want to make changes to medical advance directive? No - Patient declined  Copy of [place] in Chart? Yes  Pre-existing out of facility DNR order (yellow form or pink MOST form) Yellow form placed in chart (order not valid for inpatient use)     Chief Complaint  Patient presents with  . Acute Visit    eyes red and irratated    HPI:  Pt is a 83 y.o. female seen today for an acute visit for injected R+L eyes x 2 days, stated they are itching, no change of vision, no noted drainage. Hx of dementia, not able to recall if there is cause of her red eyes.   Hx of COPD asthma, stable on Singulair 10mg  qd, Breo Ellipta 1 puff daily, albuterol HFA 2 puffs q6h  prn   Past Medical History:  Diagnosis Date  . Abnormal weight gain 05/09/2012  . Achalasia of esophagus 09/18/2013   09/18/13 Nifedipine 10mg  tid ac meals. 11/17/14 [facility] f/u as neeed   . Anxiety and depression 03/12/2012   Increased Sertraline to 50mg  daily 07/16/13 08/26/13 Mirtazapine 7.5mg  qhs  10/12/14 Pharm decreased Sertraline to 25mg .  05/04/15 TSH 2.89 09/15/15 pharm taper off Sertraline.  10/08/15 continue Sertraline 25mg  daily, POA desires. 12/02/15 wbc 6.1, Hgb 12.0, plt 271    . Asthmatic bronchitis   . BACK PAIN, LUMBAR 03/12/2007   Hx of lower back pain, had X-ray of her lumbar spine 09/08/12--spondylosis-better controlled with Hydrocodone/ApAp  10/325mg  ac and hs  01/01/13 lumbar spine inj. Able to ambulate with walker on unit until her fall 06/22/13. 06/25/13 unremarkable. X-ray lumbar spine. 11/10/13 dc Oxycodone prn-not used in 60 days.     . COLONIC POLYPS 03/12/2007   Initially noted April 2002 colonoscopy. Followup colonoscopy 05/31/2006 did not disclose any further polyps.    Marland Kitchen COPD (chronic obstructive pulmonary disease) ([place])   . Coronary artery disease   . Deep vein thrombosis of left lower extremity ([place]) 07/19/2012  . Dementia 09/16/2012   05/04/15 TSH 2.89   . Depression   . Diverticulosis 05/10/2012  . DJD (degenerative joint disease)   . Dry skin dermatitis 11/18/2012  . Edema of leg 07/01/2010  . Epistaxis 01/24/2016   01/21/16: x2, held Xarelto x 1 day, Saline Nasal spray prn, wbc 9.5, Hgb 12.4, Plt 331, Na 138, K 4.5, Bun  25, creat 0.89 01/22/16 [id number](83-66)  . Esophageal dysmotility 07/26/2013  . Esophageal dysphagia 05/27/2010   New dx of achalasia 6/ 2015 09/18/13 Nifedipine 10mg  tid ac meals.    . Esophageal stricture   . Essential hypertension, benign 10/30/2013   12/02/15 wbc 6.1, Hgb 12.0, plt 271   . Essential thrombocythemia ([place]) 06/09/2009  . Fall 06/25/2013  . GERD (gastroesophageal reflux disease)   . Hip fracture ([place]) 09/08/2012  . HYPERCHOLESTEROLEMIA 03/12/2007    08/10/15 cholesterol 119, triglycerides 143, HDL 51, LDL 39   . Lumbar back pain   . MACULAR DEGENERATION 01/26/2008   Qualifier: Diagnosis of  By: Lenna Gilford MD, Deborra Medina   . Osteoarthritis 03/12/2007  . Osteoporosis 03/12/2007   Qualifier: Diagnosis of  By: Julien Girt CMA, Marliss Czar  06/15/14 dc Vit D 1000u po daily per pharm.    . Phlebitis and thrombophlebitis of other deep vessels of lower extremities 07/10/2010  . S/P balloon dilatation of esophageal stricture 07/28/2013   07/26/13. Dr. Henrene Pastor. EGD foreign removal and Balloon dilation of esophagus. Esophageal dysmotility, suspected achalasia, dilation at the GE junction to 56mm. Candida esophagitis.    . SYNCOPE 07/30/2009   Occurred 2011. Hospitalized. Felt due to dehydration.    . TRANSIENT ISCHEMIC ATTACKS, HX OF 03/12/2007   Qualifier: Diagnosis of  By: Julien Girt CMA, Marliss Czar     Past Surgical History:  Procedure Laterality Date  . ABDOMINAL HYSTERECTOMY     and bso  . APPENDECTOMY    . BOTOX INJECTION N/A 09/18/2013   Procedure: BOTOX INJECTION;  Surgeon: Lafayette Dragon, MD;  Location: [facility];  Service: Endoscopy;  Laterality: N/A;  . BOTOX INJECTION N/A 10/16/2014   Procedure: BOTOX INJECTION;  Surgeon: Inda Castle, MD;  Location: [facility];  Service: Endoscopy;  Laterality: N/A;  . BOTOX INJECTION N/A 05/18/2016   Procedure: BOTOX INJECTION;  Surgeon: Milus Banister, MD;  Location: [facility];  Service: Endoscopy;  Laterality: N/A;  . CATARACT EXTRACTION W/ INTRAOCULAR LENS  IMPLANT, BILATERAL    . CHOLECYSTECTOMY    . ESOPHAGEAL MANOMETRY N/A 08/25/2013   Procedure: ESOPHAGEAL MANOMETRY (EM);  Surgeon: Jerene Bears, MD;  Location: [facility];  Service: Gastroenterology;  Laterality: N/A;  . ESOPHAGOGASTRODUODENOSCOPY N/A 07/26/2013   Procedure: ESOPHAGOGASTRODUODENOSCOPY (EGD);  Surgeon: Irene Shipper, MD;  Location: [facility] END[facility]COPY;  Service: Endoscopy;  Laterality: N/A;  . ESOPHAGOGASTRODUODENOSCOPY N/A 10/16/2014   Procedure:  ESOPHAGOGASTRODUODENOSCOPY (EGD);  Surgeon: Inda Castle, MD;  Location: [facility];  Service: Endoscopy;  Laterality: N/A;  with botox  . ESOPHAGOGASTRODUODENOSCOPY (EGD) WITH ESOPHAGEAL DILATION N/A 11/25/2012   Procedure: ESOPHAGOGASTRODUODENOSCOPY (EGD) WITH ESOPHAGEAL DILATION;  Surgeon: Jerene Bears, MD;  Location: [facility];  Service: Gastroenterology;  Laterality: N/A;  . ESOPHAGOGASTRODUODENOSCOPY (EGD) WITH PROPOFOL N/A 09/18/2013   Procedure: ESOPHAGOGASTRODUODENOSCOPY (EGD) WITH PROPOFOL;  Surgeon: Lafayette Dragon, MD;  Location: [facility];  Service: Endoscopy;  Laterality: N/A;  . ESOPHAGOGASTRODUODENOSCOPY (EGD) WITH PROPOFOL N/A 05/18/2016   Procedure: ESOPHAGOGASTRODUODENOSCOPY (EGD) WITH PROPOFOL;  Surgeon: Milus Banister, MD;  Location: [facility];  Service: Endoscopy;  Laterality: N/A;  . HIP ARTHROPLASTY Right 09/09/2012   Procedure: ARTHROPLASTY BIPOLAR HIP;  Surgeon: Mauri Pole, MD;  Location: [facility];  Service: Orthopedics;  Laterality: Right;  . TAH and BSO  08/21/1995    Allergies  Allergen Reactions  . Sulfamethoxazole Rash    Outpatient Encounter Prescriptions as of 09/25/2016  Medication Sig  . acetaminophen (TYLENOL) 325 MG tablet Take 650 mg by  mouth every 4 (four) hours as needed for moderate pain.   Marland Kitchen albuterol (PROVENTIL HFA;VENTOLIN HFA) 108 (90 BASE) MCG/ACT inhaler Inhale 2 puffs into the lungs every 6 (six) hours as needed for wheezing or shortness of breath.  Marland Kitchen atorvastatin (LIPITOR) 20 MG tablet Take 20 mg by mouth at bedtime.   . CHLORHEXIDINE GLUCONATE, BULK, SOLN Take 5 mLs by mouth daily. 5 ml swish and spit for 30 seconds every day indefinitely   . feeding supplement (RESOURCE BREEZE) LIQD Take 90 mLs by mouth 2 (two) times daily.   . fluticasone furoate-vilanterol (BREO ELLIPTA) 100-25 MCG/INH AEPB Inhale 1 puff into the lungs daily. One puff inhale once a day, rinse mouth after each use.   . Humidifier MISC Please use humidifier to  prevent excessive dryness.  Marland Kitchen HYDROcodone-acetaminophen (NORCO) 10-325 MG per tablet Take 1 tablet by mouth 4 (four) times daily.  . hydroxyurea (HYDREA) 500 MG capsule Take 500-1,000 mg by mouth See admin instructions. Takes 1000 mg on Monday only Takes 500 mg on Sunday, Wednesday and Friday only  . memantine (NAMENDA) 10 MG tablet Take 10 mg by mouth 2 (two) times daily.   . metoprolol tartrate (LOPRESSOR) 25 MG tablet Take 25 mg by mouth 2 (two) times daily.  . montelukast (SINGULAIR) 10 MG tablet Take 10 mg by mouth at bedtime.  . Multiple Vitamins-Minerals (PRESERVISION AREDS 2) CAPS Take 1 capsule by mouth 2 (two) times daily.  Marland Kitchen NIFEdipine (PROCARDIA) 10 MG capsule Take 10 mg by mouth 3 (three) times daily. Squeeze contents of one capsule uner tongue 15-30 minutes before meals 3 times a day   . nitroGLYCERIN (NITROSTAT) 0.4 MG SL tablet Place 0.4 mg under the tongue every 5 (five) minutes as needed for chest pain.  Marland Kitchen omeprazole (PRILOSEC) 20 MG capsule Take 20 mg by mouth at bedtime.   . Rivaroxaban (XARELTO) 15 MG TABS tablet Take 15 mg by mouth every evening. 5pm  . sertraline (ZOLOFT) 25 MG tablet Take 25 mg by mouth at bedtime.   . sodium fluoride (DENTA 5000 PLUS) 1.1 % CREA dental cream Place 1 application onto teeth at bedtime.   No facility-administered encounter medications on file as of 09/25/2016.     Review of Systems  Constitutional: Negative for activity change, appetite change and fever.  HENT: Negative for congestion and rhinorrhea.   Eyes: Positive for redness and itching. Negative for pain and visual disturbance.  Respiratory: Negative for cough and choking.        Has dysphagia and gets botox injections  Cardiovascular: Negative for chest pain and leg swelling.  Gastrointestinal: Negative for abdominal pain and constipation.  Genitourinary: Negative for difficulty urinating and dysuria.       Occasional urinary incontinent.   Musculoskeletal: Negative for back  pain.       Ambulates with walker for short distance, w/c to go further, transfer independently  Skin: Negative for rash and wound.  Neurological: Negative for light-headedness and headaches.  Psychiatric/Behavioral: Positive for confusion. Negative for behavioral problems and sleep disturbance.    Immunization History  Administered Date(s) Administered  . Influenza Split 11/16/2010, 12/13/2011  . Influenza Whole 11/27/2009  . Influenza-Unspecified 12/31/2013, 11/18/2014, 12/14/2015  . PPD Test 09/11/2012  . Pneumococcal Polysaccharide-23 02/28/1988   Pertinent  Health Maintenance Due  Topic Date Due  . INFLUENZA VACCINE  09/27/2016  . PNA vac Low Risk Adult (2 of 2 - PCV13) 02/27/2017 (Originally 02/27/1989)  . DEXA SCAN  Completed   Fall  Risk  10/04/2015 12/28/2014 10/23/2014 11/24/2013  Falls in the past year? No Yes Yes Yes  Number falls in past yr: - 1 1 2  or more  Injury with Fall? - No No -  Risk for fall due to : Impaired mobility;Impaired balance/gait History of fall(s);Impaired balance/gait;Impaired mobility Impaired mobility History of fall(s);Impaired balance/gait;Impaired vision;Impaired mobility  Follow up - Falls prevention discussed Falls evaluation completed -   Functional Status Survey:    Vitals:   09/25/16 1104  BP: 122/64  Pulse: 66  Resp: 18  Temp: 98.2 F (36.8 C)  Weight: 98 lb 8 oz (44.7 kg)  Height: 5\' 1"  (1.549 m)   Body mass index is 18.61 kg/m. Physical Exam  Constitutional: She appears well-developed and well-nourished.  Frail, elderly  HENT:  Head: Normocephalic and atraumatic.  Eyes: Pupils are equal, round, and reactive to light. Conjunctivae are normal.  R+L conjunctival redness, no pain, no drainage.   Neck: Normal range of motion. Neck supple.  Cardiovascular: Normal rate and regular rhythm.   Pulmonary/Chest: Effort normal. She has no wheezes. She has rales.  Decreased air entry to lung bases. Dry rale posterior left base.     Abdominal: Soft. Bowel sounds are normal. She exhibits no distension. There is no tenderness.  Musculoskeletal: She exhibits no edema.  On wheelchair. Kyphosis and scoliosis present  Neurological: She is alert.  Skin: Skin is warm and dry.  Oriented to person, place and time  Psychiatric: She has a normal mood and affect.    Labs reviewed:  Recent Labs  02/10/16 1530 03/09/16 06/13/16  NA 139 143 139  K 4.3 3.9 4.2  CL 101  --   --   GLUCOSE 113*  --   --   BUN 24* 17 17  CREATININE 0.90 0.7 0.7    Recent Labs  03/09/16 06/13/16  AST 17 14  ALT 10 9  ALKPHOS 68 63    Recent Labs  06/13/16 07/04/16 09/05/16  WBC 6.0 5.7 6.3  HGB 10.4* 10.7* 10.9*  HCT 31* 32* 33*  PLT 303 312 305   Lab Results  Component Value Date   TSH 2.80 06/13/2016   Lab Results  Component Value Date   HGBA1C 5.5 10/16/2014   Lab Results  Component Value Date   CHOL 118 08/10/2016   HDL 46 08/10/2016   LDLCALC 51 08/10/2016   LDLDIRECT 152.3 06/04/2009   TRIG 128 08/10/2016   CHOLHDL 3 09/13/2011    Significant Diagnostic Results in last 30 days:  No results found.  Assessment/Plan Allergic conjunctivitis  injected R+L eyes x 2 days, stated they are itching, no change of vision, no noted drainage. Trial of Naphcon A I gtt OU bid x 7 days, observe.    COPD with asthma ([place]) stable on Singulair 10mg  qd, Breo Ellipta 1 puff daily, albuterol HFA 2 puffs q6h prn   Dementia 11/25/14 MMSE 28/30, continue Namenda.     Family/ staff Communication: SNF  Labs/tests ordered:  none

## 2016-09-27 DIAGNOSIS — H1010 Acute atopic conjunctivitis, unspecified eye: Secondary | ICD-10-CM | POA: Insufficient documentation

## 2016-09-27 NOTE — Assessment & Plan Note (Signed)
injected R+L eyes x 2 days, stated they are itching, no change of vision, no noted drainage. Trial of Naphcon A I gtt OU bid x 7 days, observe.

## 2016-09-27 NOTE — Assessment & Plan Note (Signed)
11/25/14 MMSE 28/30, continue Namenda.

## 2016-09-27 NOTE — Assessment & Plan Note (Signed)
stable on Singulair 10mg  qd, Breo Ellipta 1 puff daily, albuterol HFA 2 puffs q6h prn

## 2016-10-24 ENCOUNTER — Encounter: Payer: Self-pay | Admitting: Internal Medicine

## 2016-10-24 ENCOUNTER — Non-Acute Institutional Stay (SKILLED_NURSING_FACILITY): Payer: Medicare Other | Admitting: Internal Medicine

## 2016-10-24 DIAGNOSIS — E785 Hyperlipidemia, unspecified: Secondary | ICD-10-CM | POA: Diagnosis not present

## 2016-10-24 DIAGNOSIS — Z86718 Personal history of other venous thrombosis and embolism: Secondary | ICD-10-CM | POA: Diagnosis not present

## 2016-10-24 DIAGNOSIS — F329 Major depressive disorder, single episode, unspecified: Secondary | ICD-10-CM

## 2016-10-24 DIAGNOSIS — F341 Dysthymic disorder: Secondary | ICD-10-CM

## 2016-10-24 DIAGNOSIS — K220 Achalasia of cardia: Secondary | ICD-10-CM

## 2016-10-24 DIAGNOSIS — E43 Unspecified severe protein-calorie malnutrition: Secondary | ICD-10-CM | POA: Diagnosis not present

## 2016-10-24 NOTE — Progress Notes (Signed)
Location:  [facility] Room Number: 35 Place of Service:  SNF [phone]) Provider:  Minsa Weddington [name], [name] MD  Patient Care Team: Blanchie Serve, MD as PCP - General (Internal Medicine) Annia Belt, MD as Consulting Physician (Hematology and Oncology) Suella Broad, MD as Consulting Physician (Physical Medicine and Rehabilitation) Noralee Space, MD (Pulmonary Disease) [name], [name], NP as Nurse Practitioner (Nurse Practitioner) Inda Castle, MD as Consulting Physician (Gastroenterology)  Extended Emergency Contact Information Primary Emergency Contact: Hoefle,Gene Address: [address]          [place], [place] [address] [place] Phone: [phone] Work Phone: [phone] Mobile Phone: [phone] Relation: Son Secondary Emergency Contact: [name], [place] Phone: [phone] Relation: Son  Code Status:  DNR Goals of care: Advanced Directive information Advanced Directives 10/24/2016  Does Patient Have a Medical Advance Directive? Yes  Type of Advance Directive Out of facility DNR (pink MOST or yellow form);Healthcare Power of Attorney  Does patient want to make changes to medical advance directive? No - Patient declined  Copy of [place] in Chart? Yes  Pre-existing out of facility DNR order (yellow form or pink MOST form) Yellow form placed in chart (order not valid for inpatient use)     Chief Complaint  Patient presents with  . Medical Management of Chronic Issues    Routine Visit     HPI:  Pt is a 83 y.o. female seen today for medical management of chronic diseases. She denies any concern. She is pleasantly confused. She takes her medications with apple sauce. She feeds herself. Her appetite is fair. She is incontinent with her bowel and baldder. No new concern from nursing. She continues to receive  protein supplement. Reviewed her lab work with cholesterol level. Mood remains stable.    Past Medical History:  Diagnosis Date  . Abnormal weight gain 05/09/2012  . Achalasia of esophagus 09/18/2013   09/18/13 Nifedipine 10mg  tid ac meals. 11/17/14 [facility] f/u as neeed   . Anxiety and depression 03/12/2012   Increased Sertraline to 50mg  daily 07/16/13 08/26/13 Mirtazapine 7.5mg  qhs  10/12/14 Pharm decreased Sertraline to 25mg .  05/04/15 TSH 2.89 09/15/15 pharm taper off Sertraline.  10/08/15 continue Sertraline 25mg  daily, POA desires. 12/02/15 wbc 6.1, Hgb 12.0, plt 271    . Asthmatic bronchitis   . BACK PAIN, LUMBAR 03/12/2007   Hx of lower back pain, had X-ray of her lumbar spine 09/08/12--spondylosis-better controlled with Hydrocodone/ApAp  10/325mg  ac and hs  01/01/13 lumbar spine inj. Able to ambulate with walker on unit until her fall 06/22/13. 06/25/13 unremarkable. X-ray lumbar spine. 11/10/13 dc Oxycodone prn-not used in 60 days.     . COLONIC POLYPS 03/12/2007   Initially noted April 2002 colonoscopy. Followup colonoscopy 05/31/2006 did not disclose any further polyps.    Marland Kitchen COPD (chronic obstructive pulmonary disease) ([place])   . Coronary artery disease   . Deep vein thrombosis of left lower extremity ([place]) 07/19/2012  . Dementia 09/16/2012   05/04/15 TSH 2.89   . Depression   . Diverticulosis 05/10/2012  . DJD (degenerative joint disease)   . Dry skin dermatitis 11/18/2012  . Edema of leg 07/01/2010  . Epistaxis 01/24/2016   01/21/16: x2, held Xarelto x 1 day, Saline Nasal spray prn, wbc 9.5, Hgb 12.4, Plt 331, Na 138, K 4.5, Bun 25,  creat 0.89 01/22/16 ZOX09(60-45)  . Esophageal dysmotility 07/26/2013  . Esophageal dysphagia 05/27/2010   New dx of achalasia 6/ 2015 09/18/13 Nifedipine 10mg  tid ac meals.    . Esophageal stricture   . Essential hypertension, benign 10/30/2013   12/02/15 wbc 6.1, Hgb 12.0, plt 271   . Essential thrombocythemia ([place]) 06/09/2009  . Fall 06/25/2013  . GERD (gastroesophageal  reflux disease)   . Hip fracture ([place]) 09/08/2012  . HYPERCHOLESTEROLEMIA 03/12/2007   08/10/15 cholesterol 119, triglycerides 143, HDL 51, LDL 39   . Lumbar back pain   . MACULAR DEGENERATION 01/26/2008   Qualifier: Diagnosis of  By: Lenna Gilford MD, Deborra Medina   . Osteoarthritis 03/12/2007  . Osteoporosis 03/12/2007   Qualifier: Diagnosis of  By: Julien Girt CMA, Marliss Czar  06/15/14 dc Vit D 1000u po daily per pharm.    . Phlebitis and thrombophlebitis of other deep vessels of lower extremities 07/10/2010  . S/P balloon dilatation of esophageal stricture 07/28/2013   07/26/13. Dr. Henrene Pastor. EGD foreign removal and Balloon dilation of esophagus. Esophageal dysmotility, suspected achalasia, dilation at the GE junction to 98mm. Candida esophagitis.    . SYNCOPE 07/30/2009   Occurred 2011. Hospitalized. Felt due to dehydration.    . TRANSIENT ISCHEMIC ATTACKS, HX OF 03/12/2007   Qualifier: Diagnosis of  By: Julien Girt CMA, Marliss Czar     Past Surgical History:  Procedure Laterality Date  . ABDOMINAL HYSTERECTOMY     and bso  . APPENDECTOMY    . BOTOX INJECTION N/A 09/18/2013   Procedure: BOTOX INJECTION;  Surgeon: Lafayette Dragon, MD;  Location: [facility];  Service: Endoscopy;  Laterality: N/A;  . BOTOX INJECTION N/A 10/16/2014   Procedure: BOTOX INJECTION;  Surgeon: Inda Castle, MD;  Location: [facility];  Service: Endoscopy;  Laterality: N/A;  . BOTOX INJECTION N/A 05/18/2016   Procedure: BOTOX INJECTION;  Surgeon: Milus Banister, MD;  Location: [facility];  Service: Endoscopy;  Laterality: N/A;  . CATARACT EXTRACTION W/ INTRAOCULAR LENS  IMPLANT, BILATERAL    . CHOLECYSTECTOMY    . ESOPHAGEAL MANOMETRY N/A 08/25/2013   Procedure: ESOPHAGEAL MANOMETRY (EM);  Surgeon: Jerene Bears, MD;  Location: [facility];  Service: Gastroenterology;  Laterality: N/A;  . ESOPHAGOGASTRODUODENOSCOPY N/A 07/26/2013   Procedure: ESOPHAGOGASTRODUODENOSCOPY (EGD);  Surgeon: Irene Shipper, MD;  Location: [facility] END[facility];  Service: Endoscopy;   Laterality: N/A;  . ESOPHAGOGASTRODUODENOSCOPY N/A 10/16/2014   Procedure: ESOPHAGOGASTRODUODENOSCOPY (EGD);  Surgeon: Inda Castle, MD;  Location: [facility];  Service: Endoscopy;  Laterality: N/A;  with botox  . ESOPHAGOGASTRODUODENOSCOPY (EGD) WITH ESOPHAGEAL DILATION N/A 11/25/2012   Procedure: ESOPHAGOGASTRODUODENOSCOPY (EGD) WITH ESOPHAGEAL DILATION;  Surgeon: Jerene Bears, MD;  Location: [facility];  Service: Gastroenterology;  Laterality: N/A;  . ESOPHAGOGASTRODUODENOSCOPY (EGD) WITH PROPOFOL N/A 09/18/2013   Procedure: ESOPHAGOGASTRODUODENOSCOPY (EGD) WITH PROPOFOL;  Surgeon: Lafayette Dragon, MD;  Location: [facility];  Service: Endoscopy;  Laterality: N/A;  . ESOPHAGOGASTRODUODENOSCOPY (EGD) WITH PROPOFOL N/A 05/18/2016   Procedure: ESOPHAGOGASTRODUODENOSCOPY (EGD) WITH PROPOFOL;  Surgeon: Milus Banister, MD;  Location: [facility];  Service: Endoscopy;  Laterality: N/A;  . HIP ARTHROPLASTY Right 09/09/2012   Procedure: ARTHROPLASTY BIPOLAR HIP;  Surgeon: Mauri Pole, MD;  Location: [facility];  Service: Orthopedics;  Laterality: Right;  . TAH and BSO  08/21/1995    Allergies  Allergen Reactions  . Sulfamethoxazole Rash    Outpatient Encounter Prescriptions as of 10/24/2016  Medication Sig  . acetaminophen (TYLENOL) 325 MG tablet Take 650 mg by mouth  every 4 (four) hours as needed for moderate pain.   Marland Kitchen albuterol (PROVENTIL HFA;VENTOLIN HFA) 108 (90 BASE) MCG/ACT inhaler Inhale 2 puffs into the lungs every 6 (six) hours as needed for wheezing or shortness of breath.  Marland Kitchen atorvastatin (LIPITOR) 20 MG tablet Take 20 mg by mouth at bedtime.   . CHLORHEXIDINE GLUCONATE, BULK, SOLN Take 5 mLs by mouth daily. 5 ml swish and spit for 30 seconds every day indefinitely   . feeding supplement (RESOURCE BREEZE) LIQD Take 90 mLs by mouth 2 (two) times daily.   . fluticasone furoate-vilanterol (BREO ELLIPTA) 100-25 MCG/INH AEPB Inhale 1 puff into the lungs daily. One puff inhale once a day,  rinse mouth after each use.   . Humidifier MISC Please use humidifier to prevent excessive dryness.  Marland Kitchen HYDROcodone-acetaminophen (NORCO) 10-325 MG per tablet Take 1 tablet by mouth 4 (four) times daily.  . hydroxyurea (HYDREA) 500 MG capsule Take 500-1,000 mg by mouth See admin instructions. Takes 1000 mg on Monday only Takes 500 mg on Sunday, Wednesday and Friday only  . memantine (NAMENDA) 10 MG tablet Take 10 mg by mouth 2 (two) times daily.   . metoprolol tartrate (LOPRESSOR) 25 MG tablet Take 25 mg by mouth 2 (two) times daily.  . montelukast (SINGULAIR) 10 MG tablet Take 10 mg by mouth at bedtime.  . Multiple Vitamins-Minerals (PRESERVISION AREDS 2) CAPS Take 1 capsule by mouth 2 (two) times daily.  Marland Kitchen NIFEdipine (PROCARDIA) 10 MG capsule Take 10 mg by mouth 3 (three) times daily. Squeeze contents of one capsule uner tongue 15-30 minutes before meals 3 times a day   . nitroGLYCERIN (NITROSTAT) 0.4 MG SL tablet Place 0.4 mg under the tongue every 5 (five) minutes as needed for chest pain.  Marland Kitchen omeprazole (PRILOSEC) 20 MG capsule Take 20 mg by mouth at bedtime.   . Rivaroxaban (XARELTO) 15 MG TABS tablet Take 15 mg by mouth every evening. 5pm  . sertraline (ZOLOFT) 25 MG tablet Take 25 mg by mouth at bedtime.   . sodium fluoride (DENTA 5000 PLUS) 1.1 % CREA dental cream Place 1 application onto teeth at bedtime.   No facility-administered encounter medications on file as of 10/24/2016.     Review of Systems  Constitutional: Negative for appetite change, chills, diaphoresis, fatigue, fever and unexpected weight change.  HENT: Negative for congestion, ear discharge, ear pain, mouth sores, rhinorrhea, sore throat and trouble swallowing.   Eyes: Positive for visual disturbance.       Has corrective glasses  Respiratory: Negative for cough, shortness of breath and wheezing.        Has dysphagia and gets botox injections  Cardiovascular: Negative for chest pain, palpitations and leg swelling.    Gastrointestinal: Negative for abdominal pain, blood in stool, constipation, nausea and vomiting.  Genitourinary: Negative for dysuria, flank pain, hematuria and pelvic pain.       Urinary incontinence  Musculoskeletal: Positive for gait problem. Negative for back pain.       No fall reported. Uses her wheelchair  Skin: Negative for rash and wound.  Neurological: Negative for dizziness, tremors, light-headedness and headaches.  Psychiatric/Behavioral: Positive for confusion. Negative for behavioral problems and sleep disturbance.    Immunization History  Administered Date(s) Administered  . Influenza Split 11/16/2010, 12/13/2011  . Influenza Whole 11/27/2009  . Influenza-Unspecified 12/31/2013, 11/18/2014, 12/14/2015  . PPD Test 09/11/2012  . Pneumococcal Polysaccharide-23 02/28/1988   Pertinent  Health Maintenance Due  Topic Date Due  . INFLUENZA VACCINE  09/27/2016  . PNA vac Low Risk Adult (2 of 2 - PCV13) 02/27/2017 (Originally 02/27/1989)  . DEXA SCAN  Completed   Fall Risk  10/04/2015 12/28/2014 10/23/2014 11/24/2013  Falls in the past year? No Yes Yes Yes  Number falls in past yr: - 1 1 2  or more  Injury with Fall? - No No -  Risk for fall due to : Impaired mobility;Impaired balance/gait History of fall(s);Impaired balance/gait;Impaired mobility Impaired mobility History of fall(s);Impaired balance/gait;Impaired vision;Impaired mobility  Follow up - Falls prevention discussed Falls evaluation completed -   Functional Status Survey:    Vitals:   10/24/16 1434  BP: 138/80  Pulse: 63  Resp: 16  Temp: 97.7 F (36.5 C)  TempSrc: Oral  Weight: 98 lb 14.4 oz (44.9 kg)  Height: 5\' 1"  (1.549 m)   Body mass index is 18.69 kg/m.   Wt Readings from Last 3 Encounters:  10/24/16 98 lb 14.4 oz (44.9 kg)  09/25/16 98 lb 8 oz (44.7 kg)  09/14/16 98 lb 8 oz (44.7 kg)   Physical Exam  Constitutional: No distress.  Frail, elderly, thin built female  HENT:  Head: Normocephalic  and atraumatic.  Mouth/Throat: Oropharynx is clear and moist.  Eyes: Pupils are equal, round, and reactive to light. Conjunctivae are normal.  Neck: Normal range of motion. Neck supple.  Cardiovascular: Normal rate and regular rhythm.   Pulmonary/Chest: Effort normal. She has no wheezes. She has no rales.  Decreased air entry to lung bases  Abdominal: Soft. Bowel sounds are normal. She exhibits no distension. There is no tenderness. There is no guarding.  Musculoskeletal: She exhibits no edema.  On wheelchair. Kyphosis and scoliosis present  Lymphadenopathy:    She has no cervical adenopathy.  Neurological: She is alert.  Skin: Skin is warm and dry. She is not diaphoretic.  Oriented to person, place and year but not to month  Psychiatric: She has a normal mood and affect.    Labs reviewed:  Recent Labs  02/10/16 1530 03/09/16 06/13/16  NA 139 143 139  K 4.3 3.9 4.2  CL 101  --   --   GLUCOSE 113*  --   --   BUN 24* 17 17  CREATININE 0.90 0.7 0.7    Recent Labs  03/09/16 06/13/16  AST 17 14  ALT 10 9  ALKPHOS 68 63    Recent Labs  06/13/16 07/04/16 09/05/16  WBC 6.0 5.7 6.3  HGB 10.4* 10.7* 10.9*  HCT 31* 32* 33*  PLT 303 312 305   Lab Results  Component Value Date   TSH 2.00 09/21/2016   Lab Results  Component Value Date   HGBA1C 5.5 10/16/2014   Lab Results  Component Value Date   CHOL 118 08/10/2016   HDL 46 08/10/2016   LDLCALC 51 08/10/2016   LDLDIRECT 152.3 06/04/2009   TRIG 128 08/10/2016   CHOLHDL 3 09/13/2011    Significant Diagnostic Results in last 30 days:  No results found.  Assessment/Plan  Severe Protein calorie malnutrition Low BMI. Continue feeding supplement. RD to follow.   Chronic depression Currently on sertraline 25 mg daily, attempt GDR to 12.5 mg daily and monitor.   Hyperlipidemia LDL at goal, decrease atorvastatin to 10 mg daily for now and monitor  achalasia With esophageal dysmotility. Denies GERD but has  occasional dysphagia. Continue omeprazole with nifedipine and monitor. Followed by GI  History of DVT With history of myeloproliferative disorder, will continue xarelto lifelong given er high risk  for recurrent DVT.    Family/ staff Communication: reviewed care plan with patient and charge nurse.    Labs/tests ordered: none   Blanchie Serve, MD Internal Medicine [facility] [address] [place], [place] [address] Cell Phone (Monday-Friday 8 am - 5 pm): [phone] On Call: [phone] and follow prompts after 5 pm and on weekends Office Phone: [phone] Office Fax: [phone]

## 2016-11-07 ENCOUNTER — Telehealth: Payer: Self-pay | Admitting: *Deleted

## 2016-11-07 DIAGNOSIS — D649 Anemia, unspecified: Secondary | ICD-10-CM | POA: Diagnosis not present

## 2016-11-07 LAB — CBC AND DIFFERENTIAL
HCT: 34 — AB (ref 36–46)
HEMOGLOBIN: 11.6 — AB (ref 12.0–16.0)
PLATELETS: 279 (ref 150–399)
WBC: 5.6

## 2016-11-07 NOTE — Telephone Encounter (Signed)
Talked to [place] at [facility] - informed " lab stable continue same dose of Hydrea" per Dr Beryle Beams. Voiced understanding.

## 2016-11-07 NOTE — Telephone Encounter (Signed)
-----   Message from Annia Belt, MD sent at 11/07/2016  2:38 PM EDT ----- Call pt lab stable continue same dose of Hydrea ----- Message ----- From: Ebbie Latus, RN Sent: 11/07/2016  12:07 PM To: Annia Belt, MD  Dr Darnell Level - received lab results from on Ms Bartosiewicz. Hgb 11.6 WBC 5.6 Plt 279. I will put results in your mailbox.  Glender [place]

## 2016-11-08 ENCOUNTER — Other Ambulatory Visit: Payer: Self-pay | Admitting: *Deleted

## 2016-11-24 ENCOUNTER — Non-Acute Institutional Stay (SKILLED_NURSING_FACILITY): Payer: Medicare Other | Admitting: Internal Medicine

## 2016-11-24 ENCOUNTER — Encounter: Payer: Self-pay | Admitting: Internal Medicine

## 2016-11-24 DIAGNOSIS — F329 Major depressive disorder, single episode, unspecified: Secondary | ICD-10-CM | POA: Diagnosis not present

## 2016-11-24 DIAGNOSIS — D473 Essential (hemorrhagic) thrombocythemia: Secondary | ICD-10-CM

## 2016-11-24 DIAGNOSIS — G8929 Other chronic pain: Secondary | ICD-10-CM | POA: Insufficient documentation

## 2016-11-24 DIAGNOSIS — F0280 Dementia in other diseases classified elsewhere without behavioral disturbance: Secondary | ICD-10-CM

## 2016-11-24 DIAGNOSIS — M810 Age-related osteoporosis without current pathological fracture: Secondary | ICD-10-CM

## 2016-11-24 DIAGNOSIS — G309 Alzheimer's disease, unspecified: Secondary | ICD-10-CM | POA: Diagnosis not present

## 2016-11-24 DIAGNOSIS — F32A Depression, unspecified: Secondary | ICD-10-CM | POA: Insufficient documentation

## 2016-11-24 NOTE — Progress Notes (Signed)
Location:  [facility] Room Number: 35 Place of Service:  SNF [phone]) Provider:  Walburga Hudman [name], Robbie Nangle MD  Patient Care Team: Blanchie Serve, MD as PCP - General (Internal Medicine) Annia Belt, MD as Consulting Physician (Hematology and Oncology) Suella Broad, MD as Consulting Physician (Physical Medicine and Rehabilitation) Noralee Space, MD (Pulmonary Disease) [name], [name], NP as Nurse Practitioner (Nurse Practitioner) Inda Castle, MD as Consulting Physician (Gastroenterology)  Extended Emergency Contact Information Primary Emergency Contact: Bohnsack,Sherri Crawford Address: [address]          [place], [place] [address] [place] Phone: [phone] Work Phone: [phone] Mobile Phone: [phone] Relation: Son Secondary Emergency Contact: Sherri Crawford, [place] Phone: [phone] Relation: Son  Code Status:  DNR Goals of care: Advanced Directive information Advanced Directives 11/24/2016  Does Patient Have a Medical Advance Directive? Yes  Type of Paramedic of [place];Living will;Out of facility DNR (pink MOST or yellow form)  Does patient want to make changes to medical advance directive? No - Patient declined  Copy of [place] in Chart? Yes  Pre-existing out of facility DNR order (yellow form or pink MOST form) Yellow form placed in chart (order not valid for inpatient use)     Chief Complaint  Patient presents with  . Medical Management of Chronic Issues    Routine Visit     HPI:  Pt is a 83 y.o. female seen today for medical management of chronic diseases. She denies any concern. She takes her medications with apple sauce. She feeds herself. She is incontinent with her bowel and baldder at times. No new concern from nursing. She continues to receive protein supplement. She gets out  of bed daily. She gets around with her wheelchair.   Past Medical History:  Diagnosis Date  . Abnormal weight gain 05/09/2012  . Achalasia of esophagus 09/18/2013   09/18/13 Nifedipine 10mg  tid ac meals. 11/17/14 [facility] f/u as neeed   . Anxiety and depression 03/12/2012   Increased Sertraline to 50mg  daily 07/16/13 08/26/13 Mirtazapine 7.5mg  qhs  10/12/14 Pharm decreased Sertraline to 25mg .  05/04/15 TSH 2.89 09/15/15 pharm taper off Sertraline.  10/08/15 continue Sertraline 25mg  daily, POA desires. 12/02/15 wbc 6.1, Hgb 12.0, plt 271    . Asthmatic bronchitis   . BACK PAIN, LUMBAR 03/12/2007   Hx of lower back pain, had X-ray of her lumbar spine 09/08/12--spondylosis-better controlled with Hydrocodone/ApAp  10/325mg  ac and hs  01/01/13 lumbar spine inj. Able to ambulate with walker on unit until her fall 06/22/13. 06/25/13 unremarkable. X-ray lumbar spine. 11/10/13 dc Oxycodone prn-not used in 60 days.     . COLONIC POLYPS 03/12/2007   Initially noted April 2002 colonoscopy. Followup colonoscopy 05/31/2006 did not disclose any further polyps.    Marland Kitchen COPD (chronic obstructive pulmonary disease) ([place])   . Coronary artery disease   . Deep vein thrombosis of left lower extremity ([place]) 07/19/2012  . Dementia 09/16/2012   05/04/15 TSH 2.89   . Depression   . Diverticulosis 05/10/2012  . DJD (degenerative joint disease)   . Dry skin dermatitis 11/18/2012  . Edema of leg 07/01/2010  . Epistaxis 01/24/2016   01/21/16: x2, held Xarelto x 1 day, Saline Nasal spray prn, wbc 9.5, Hgb 12.4, Plt 331, Na 138, K 4.5, Bun 25, creat 0.89 01/22/16 [id number](54-56)  .  Esophageal dysmotility 07/26/2013  . Esophageal dysphagia 05/27/2010   New dx of achalasia 6/ 2015 09/18/13 Nifedipine 10mg  tid ac meals.    . Esophageal stricture   . Essential hypertension, benign 10/30/2013   12/02/15 wbc 6.1, Hgb 12.0, plt 271   . Essential thrombocythemia ([place]) 06/09/2009  . Fall 06/25/2013  . GERD (gastroesophageal reflux disease)   . Hip fracture ([place])  09/08/2012  . HYPERCHOLESTEROLEMIA 03/12/2007   08/10/15 cholesterol 119, triglycerides 143, HDL 51, LDL 39   . Lumbar back pain   . MACULAR DEGENERATION 01/26/2008   Qualifier: Diagnosis of  By: Lenna Gilford MD, Deborra Medina   . Osteoarthritis 03/12/2007  . Osteoporosis 03/12/2007   Qualifier: Diagnosis of  By: Julien Girt CMA, Marliss Czar  06/15/14 dc Vit D 1000u po daily per pharm.    . Phlebitis and thrombophlebitis of other deep vessels of lower extremities 07/10/2010  . S/P balloon dilatation of esophageal stricture 07/28/2013   07/26/13. Dr. Henrene Pastor. EGD foreign removal and Balloon dilation of esophagus. Esophageal dysmotility, suspected achalasia, dilation at the GE junction to 33mm. Candida esophagitis.    . SYNCOPE 07/30/2009   Occurred 2011. Hospitalized. Felt due to dehydration.    . TRANSIENT ISCHEMIC ATTACKS, HX OF 03/12/2007   Qualifier: Diagnosis of  By: Julien Girt CMA, Marliss Czar     Past Surgical History:  Procedure Laterality Date  . ABDOMINAL HYSTERECTOMY     and bso  . APPENDECTOMY    . BOTOX INJECTION N/A 09/18/2013   Procedure: BOTOX INJECTION;  Surgeon: Lafayette Dragon, MD;  Location: [facility];  Service: Endoscopy;  Laterality: N/A;  . BOTOX INJECTION N/A 10/16/2014   Procedure: BOTOX INJECTION;  Surgeon: Inda Castle, MD;  Location: [facility];  Service: Endoscopy;  Laterality: N/A;  . BOTOX INJECTION N/A 05/18/2016   Procedure: BOTOX INJECTION;  Surgeon: Milus Banister, MD;  Location: [facility];  Service: Endoscopy;  Laterality: N/A;  . CATARACT EXTRACTION W/ INTRAOCULAR LENS  IMPLANT, BILATERAL    . CHOLECYSTECTOMY    . ESOPHAGEAL MANOMETRY N/A 08/25/2013   Procedure: ESOPHAGEAL MANOMETRY (EM);  Surgeon: Jerene Bears, MD;  Location: [facility];  Service: Gastroenterology;  Laterality: N/A;  . ESOPHAGOGASTRODUODENOSCOPY N/A 07/26/2013   Procedure: ESOPHAGOGASTRODUODENOSCOPY (EGD);  Surgeon: Irene Shipper, MD;  [facility];  Service: Endoscopy;  Laterality: N/A;  .  ESOPHAGOGASTRODUODENOSCOPY N/A 10/16/2014   Procedure: ESOPHAGOGASTRODUODENOSCOPY (EGD);  Surgeon: Inda Castle, MD;  Location: [facility];  Service: Endoscopy;  Laterality: N/A;  with botox  . ESOPHAGOGASTRODUODENOSCOPY (EGD) WITH ESOPHAGEAL DILATION N/A 11/25/2012   Procedure: ESOPHAGOGASTRODUODENOSCOPY (EGD) WITH ESOPHAGEAL DILATION;  Surgeon: Jerene Bears, MD;  [facility]: [facility];  Service: Gastroenterology;  Laterality: N/A;  . ESOPHAGOGASTRODUODENOSCOPY (EGD) WITH PROPOFOL N/A 09/18/2013   Procedure: ESOPHAGOGASTRODUODENOSCOPY (EGD) WITH PROPOFOL;  Surgeon: Lafayette Dragon, MD;  Location: [facility];  Service: Endoscopy;  Laterality: N/A;  . ESOPHAGOGASTRODUODENOSCOPY (EGD) WITH PROPOFOL N/A 05/18/2016   Procedure: ESOPHAGOGASTRODUODENOSCOPY (EGD) WITH PROPOFOL;  Surgeon: Milus Banister, MD;  Location: [facility];  Service: Endoscopy;  Laterality: N/A;  . HIP ARTHROPLASTY Right 09/09/2012   Procedure: ARTHROPLASTY BIPOLAR HIP;  Surgeon: Mauri Pole, MD;  Location: [facility];  Service: Orthopedics;  Laterality: Right;  . TAH and BSO  08/21/1995    Allergies  Allergen Reactions  . Sulfamethoxazole Rash    Outpatient Encounter Prescriptions as of 11/24/2016  Medication Sig  . acetaminophen (TYLENOL) 325 MG tablet Take 650 mg by mouth every 4 (four) hours as needed  for moderate pain.   Marland Kitchen albuterol (PROVENTIL HFA;VENTOLIN HFA) 108 (90 BASE) MCG/ACT inhaler Inhale 2 puffs into the lungs every 6 (six) hours as needed for wheezing or shortness of breath.  Marland Kitchen atorvastatin (LIPITOR) 10 MG tablet Take 10 mg by mouth daily.  . CHLORHEXIDINE GLUCONATE, BULK, SOLN Take 5 mLs by mouth daily. 5 ml swish and spit for 30 seconds every day indefinitely   . feeding supplement (RESOURCE BREEZE) LIQD Take 90 mLs by mouth 2 (two) times daily.   . fluticasone furoate-vilanterol (BREO ELLIPTA) 100-25 MCG/INH AEPB Inhale 1 puff into the lungs daily. One puff inhale once a day, rinse mouth after each use.     . Humidifier MISC Please use humidifier to prevent excessive dryness.  Marland Kitchen HYDROcodone-acetaminophen (NORCO) 10-325 MG per tablet Take 1 tablet by mouth 4 (four) times daily.  . hydroxyurea (HYDREA) 500 MG capsule Take 500-1,000 mg by mouth See admin instructions. Takes 1000 mg on Monday only Takes 500 mg on Sunday, Wednesday and Friday only  . memantine (NAMENDA) 10 MG tablet Take 10 mg by mouth 2 (two) times daily.   . metoprolol tartrate (LOPRESSOR) 25 MG tablet Take 25 mg by mouth 2 (two) times daily.  . montelukast (SINGULAIR) 10 MG tablet Take 10 mg by mouth at bedtime.  . Multiple Vitamins-Minerals (PRESERVISION AREDS 2) CAPS Take 1 capsule by mouth 2 (two) times daily.  Marland Kitchen NIFEdipine (PROCARDIA) 10 MG capsule Take 10 mg by mouth 3 (three) times daily. Squeeze contents of one capsule uner tongue 15-30 minutes before meals 3 times a day   . nitroGLYCERIN (NITROSTAT) 0.4 MG SL tablet Place 0.4 mg under the tongue every 5 (five) minutes as needed for chest pain.  Marland Kitchen omeprazole (PRILOSEC) 20 MG capsule Take 20 mg by mouth at bedtime.   . Rivaroxaban (XARELTO) 15 MG TABS tablet Take 15 mg by mouth every evening. 5pm  . sertraline (ZOLOFT) 25 MG tablet Take 12.5 mg by mouth daily.  . sodium fluoride (DENTA 5000 PLUS) 1.1 % CREA dental cream Place 1 application onto teeth at bedtime.  . [DISCONTINUED] atorvastatin (LIPITOR) 20 MG tablet Take 20 mg by mouth at bedtime.   . [DISCONTINUED] sertraline (ZOLOFT) 25 MG tablet Take 25 mg by mouth at bedtime.    No facility-administered encounter medications on file as of 11/24/2016.     Review of Systems  Constitutional: Negative for appetite change, chills, diaphoresis, fatigue, fever and unexpected weight change.  HENT: Positive for hearing loss. Negative for congestion, ear discharge, ear pain, mouth sores, rhinorrhea, sore throat and trouble swallowing.        Has hearing aid  Eyes: Positive for visual disturbance.       Has corrective glasses   Respiratory: Negative for cough, shortness of breath and wheezing.        Has dysphagia and gets botox injections. Last one in march 2018  Cardiovascular: Negative for chest pain, palpitations and leg swelling.  Gastrointestinal: Negative for abdominal pain, blood in stool, constipation, nausea and vomiting.  Genitourinary: Negative for dysuria, flank pain, hematuria and pelvic pain.       Urinary incontinence  Musculoskeletal: Positive for gait problem. Negative for back pain.       No fall reported. Uses her wheelchair  Skin: Negative for rash and wound.  Neurological: Negative for dizziness, tremors, light-headedness and headaches.  Hematological:       Followed by hematology for essential thrombocytosis.  Psychiatric/Behavioral: Positive for confusion. Negative for behavioral problems  and sleep disturbance.    Immunization History  Administered Date(s) Administered  . Influenza Split 11/16/2010, 12/13/2011  . Influenza Whole 11/27/2009  . Influenza-Unspecified 12/31/2013, 11/18/2014, 12/14/2015  . PPD Test 09/11/2012  . Pneumococcal Polysaccharide-23 02/28/1988   Pertinent  Health Maintenance Due  Topic Date Due  . INFLUENZA VACCINE  11/27/2016 (Originally 09/27/2016)  . PNA vac Low Risk Adult (2 of 2 - PCV13) 02/27/2017 (Originally 02/27/1989)  . DEXA SCAN  Completed   Fall Risk  10/04/2015 12/28/2014 10/23/2014 11/24/2013  Falls in the past year? No Yes Yes Yes  Number falls in past yr: - 1 1 2  or more  Injury with Fall? - No No -  Risk for fall due to : Impaired mobility;Impaired balance/gait History of fall(s);Impaired balance/gait;Impaired mobility Impaired mobility History of fall(s);Impaired balance/gait;Impaired vision;Impaired mobility  Follow up - Falls prevention discussed Falls evaluation completed -   Functional Status Survey:    Vitals:   11/24/16 1415  BP: 116/60  Pulse: 66  Resp: 16  Temp: 97.6 F (36.4 C)  TempSrc: Oral  Weight: 99 lb 6.4 oz (45.1 kg)   Height: 5\' 1"  (1.549 m)   Body mass index is 18.78 kg/m.   Wt Readings from Last 3 Encounters:  11/24/16 99 lb 6.4 oz (45.1 kg)  10/24/16 98 lb 14.4 oz (44.9 kg)  09/25/16 98 lb 8 oz (44.7 kg)   Physical Exam  Constitutional: No distress.  Frail, elderly, thin built female  HENT:  Head: Normocephalic and atraumatic.  Mouth/Throat: Oropharynx is clear and moist.  Eyes: Pupils are equal, round, and reactive to light. Conjunctivae are normal.  Neck: Normal range of motion. Neck supple.  Cardiovascular: Normal rate, regular rhythm and intact distal pulses.   Pulmonary/Chest: Effort normal and breath sounds normal. She has no wheezes. She has no rales.  Abdominal: Soft. Bowel sounds are normal. She exhibits no distension. There is no tenderness. There is no guarding.  Musculoskeletal: She exhibits no edema.  On wheelchair. Kyphosis and scoliosis present  Lymphadenopathy:    She has no cervical adenopathy.  Neurological: She is alert.  Oriented to person, place and year but not to month   Skin: Skin is warm and dry. She is not diaphoretic.  Easy bruising, thin skin  Psychiatric: She has a normal mood and affect.    Labs reviewed:  Recent Labs  02/10/16 1530 03/09/16 06/13/16  NA 139 143 139  K 4.3 3.9 4.2  CL 101  --   --   GLUCOSE 113*  --   --   BUN 24* 17 17  CREATININE 0.90 0.7 0.7    Recent Labs  03/09/16 06/13/16  AST 17 14  ALT 10 9  ALKPHOS 68 63    Recent Labs  07/04/16 09/05/16 11/07/16  WBC 5.7 6.3 5.6  HGB 10.7* 10.9* 11.6*  HCT 32* 33* 34*  PLT 312 305 279   Lab Results  Component Value Date   TSH 2.00 09/21/2016   Lab Results  Component Value Date   HGBA1C 5.5 10/16/2014   Lab Results  Component Value Date   CHOL 118 08/10/2016   HDL 46 08/10/2016   LDLCALC 51 08/10/2016   LDLDIRECT 152.3 06/04/2009   TRIG 128 08/10/2016   CHOLHDL 3 09/13/2011    Significant Diagnostic Results in last 30 days:  No results  found.  Assessment/Plan  Dementia without behavior disturbance No recent MMSE. Obtain one. Supportive care. Continue namenda 10 mg bid.   Chronic depression  Tolerating decreased dosing of sertraline well, supportive care and monitor mood  Essential thrombocythemia With history of DVT, currently on anticoagulation. Also on hydroxurea. Followed by hematology services  Osteoporosis With history of fracture and OA. Avoid calcium supplement with her history of achlasia. Start vitamin d 1000 u daily. Has been on boniva in past. Fall precautions with her high fall risk.   Chronic pain With history of lumbar back pain, OA and history of fracture. Currently on tylenol 650 mg q4h prn and norco 10-325 mg qid. Denies any pain this visit. Decrease norco to 10-325 mg tid for now and monitor    Family/ staff Communication: reviewed care plan with patient and charge nurse.    Labs/tests ordered: none   Blanchie Serve, MD Internal Medicine [facility] [address] [place], [place] [address] Cell Phone (Monday-Friday 8 am - 5 pm): [phone] On Call: [phone] and follow prompts after 5 pm and on weekends Office Phone: [phone] Office Fax: [phone]

## 2016-12-25 ENCOUNTER — Encounter: Payer: Self-pay | Admitting: Nurse Practitioner

## 2016-12-25 ENCOUNTER — Non-Acute Institutional Stay (SKILLED_NURSING_FACILITY): Payer: Medicare Other | Admitting: Nurse Practitioner

## 2016-12-25 DIAGNOSIS — G309 Alzheimer's disease, unspecified: Secondary | ICD-10-CM | POA: Diagnosis not present

## 2016-12-25 DIAGNOSIS — I10 Essential (primary) hypertension: Secondary | ICD-10-CM | POA: Diagnosis not present

## 2016-12-25 DIAGNOSIS — F329 Major depressive disorder, single episode, unspecified: Secondary | ICD-10-CM | POA: Diagnosis not present

## 2016-12-25 DIAGNOSIS — F0280 Dementia in other diseases classified elsewhere without behavioral disturbance: Secondary | ICD-10-CM

## 2016-12-25 DIAGNOSIS — K220 Achalasia of cardia: Secondary | ICD-10-CM | POA: Diagnosis not present

## 2016-12-25 DIAGNOSIS — Z7901 Long term (current) use of anticoagulants: Secondary | ICD-10-CM | POA: Diagnosis not present

## 2016-12-25 DIAGNOSIS — M810 Age-related osteoporosis without current pathological fracture: Secondary | ICD-10-CM | POA: Diagnosis not present

## 2016-12-25 DIAGNOSIS — D473 Essential (hemorrhagic) thrombocythemia: Secondary | ICD-10-CM

## 2016-12-25 DIAGNOSIS — M5440 Lumbago with sciatica, unspecified side: Secondary | ICD-10-CM

## 2016-12-25 DIAGNOSIS — J449 Chronic obstructive pulmonary disease, unspecified: Secondary | ICD-10-CM

## 2016-12-25 DIAGNOSIS — F32A Depression, unspecified: Secondary | ICD-10-CM

## 2016-12-25 NOTE — Assessment & Plan Note (Signed)
Her blood pressure is controlled, continue Nifedipine 10mg  tid, Metoprolol 25mg  bid.

## 2016-12-25 NOTE — Assessment & Plan Note (Signed)
chronic back pain, no change, continue Norco 10/325mg  tid/qid from 11/24/16

## 2016-12-25 NOTE — Assessment & Plan Note (Signed)
Vit D 1000u daily added 12/24/16.

## 2016-12-25 NOTE — Assessment & Plan Note (Addendum)
09/18/13 Nifedipine 10mg  tid ac meals. 11/17/14 [facility] f/u as need 05/18/16 endoscopy. Stable. Continue Nifedipine.

## 2016-12-25 NOTE — Assessment & Plan Note (Signed)
She resides in SNF, functioning well, going around in wheelchair, continue  Memantine for memory,

## 2016-12-25 NOTE — Assessment & Plan Note (Signed)
COPD, stable, continue prn albuterol HFA q6h, Bero ellipta daily, Singulair 10mg  daily

## 2016-12-25 NOTE — Assessment & Plan Note (Signed)
thrombocythemia,  is managed with Hydroxyurea per Hematology, last Plt 305 09/05/16

## 2016-12-25 NOTE — Progress Notes (Signed)
Location:  [place] Room Number: 35 Place of Service:  SNF (31) Provider:  [name], Manxie  NP  Blanchie Serve, MD  Patient Care Team: Blanchie Serve, MD as PCP - General (Internal Medicine) Annia Belt, MD as Consulting Physician (Hematology and Oncology) Suella Broad, MD as Consulting Physician (Physical Medicine and Rehabilitation) Noralee Space, MD (Pulmonary Disease) [place] [name], [name], NP as Nurse Practitioner (Nurse Practitioner) Inda Castle, MD as Consulting Physician (Gastroenterology)  Extended Emergency Contact Information Primary Emergency Contact: Cornelio,Gene Address: [address]          [place], [place] [address] Johnnette Litter of [place] Phone: [phone] Work Phone: [phone] Mobile Phone: [phone] Relation: Son Secondary Emergency Contact: [name], [place] of [place] Phone: [phone] Relation: Son  Code Status:  DNR Goals of care: Advanced Directive information Advanced Directives 12/25/2016  Does Patient Have a Medical Advance Directive? Yes  Type of Paramedic of [place];Living will;Out of facility DNR (pink MOST or yellow form)  Does patient want to make changes to medical advance directive? No - Patient declined  Copy of [place] in Chart? Yes  Pre-existing out of facility DNR order (yellow form or pink MOST form) Yellow form placed in chart (order not valid for inpatient use)     Chief Complaint  Patient presents with  . Medical Management of Chronic Issues    HPI:  Pt is a 83 y.o. female seen today for medical management of chronic diseases.     The patient has history of chronic back pain, no change sine Norco 10/325mg  tid/qid since 11/24/16, Osteoporosis, Vit D 1000u daily added 12/24/16. Her mood is stable since Sertraline was decreased to 12.5mg  po qd 10/24/16. Hx of DVT, on  chronic Rivaroxaban 15mg  qd. Her blood pressure is controlled, taking Nifedipine 10mg  tid, Metoprolol 25mg  bid. She resides in SNF, taking Memantine for memory, thrombocythemia,  is managed with Hydroxyurea per Hematology, COPD, stable, prn albuterol HFA q6h, Bero ellipta daily, Singulair 10mg  daily   Past Medical History:  Diagnosis Date  . Abnormal weight gain 05/09/2012  . Achalasia of esophagus 09/18/2013   09/18/13 Nifedipine 10mg  tid ac meals. 11/17/14 [facility] f/u as neeed   . Anxiety and depression 03/12/2012   Increased Sertraline to 50mg  daily 07/16/13 08/26/13 Mirtazapine 7.5mg  qhs  10/12/14 Pharm decreased Sertraline to 25mg .  05/04/15 TSH 2.89 09/15/15 pharm taper off Sertraline.  10/08/15 continue Sertraline 25mg  daily, POA desires. 12/02/15 wbc 6.1, Hgb 12.0, plt 271    . Asthmatic bronchitis   . BACK PAIN, LUMBAR 03/12/2007   Hx of lower back pain, had X-ray of her lumbar spine 09/08/12--spondylosis-better controlled with Hydrocodone/ApAp  10/325mg  ac and hs  01/01/13 lumbar spine inj. Able to ambulate with walker on unit until her fall 06/22/13. 06/25/13 unremarkable. X-ray lumbar spine. 11/10/13 dc Oxycodone prn-not used in 60 days.     . COLONIC POLYPS 03/12/2007   Initially noted April 2002 colonoscopy. Followup colonoscopy 05/31/2006 did not disclose any further polyps.    Marland Kitchen COPD (chronic obstructive pulmonary disease) ([place])   . Coronary artery disease   . Deep vein thrombosis of left lower extremity ([place]) 07/19/2012  . Dementia 09/16/2012   05/04/15 TSH 2.89   . Depression   . Diverticulosis 05/10/2012  . DJD (degenerative joint disease)   . Dry skin dermatitis 11/18/2012  .  Edema of leg 07/01/2010  . Epistaxis 01/24/2016   01/21/16: x2, held Xarelto x 1 day, Saline Nasal spray prn, wbc 9.5, Hgb 12.4, Plt 331, Na 138, K 4.5, Bun 25, creat 0.89 01/22/16 [id number]([id number])  . Esophageal dysmotility 07/26/2013  . Esophageal dysphagia 05/27/2010   New dx of achalasia 6/ 2015 09/18/13 Nifedipine 10mg  tid ac  meals.    . Esophageal stricture   . Essential hypertension, benign 10/30/2013   12/02/15 wbc 6.1, Hgb 12.0, plt 271   . Essential thrombocythemia ([place]) 06/09/2009  . Fall 06/25/2013  . GERD (gastroesophageal reflux disease)   . Hip fracture ([place]) 09/08/2012  . HYPERCHOLESTEROLEMIA 03/12/2007   08/10/15 cholesterol 119, triglycerides 143, HDL 51, LDL 39   . Lumbar back pain   . MACULAR DEGENERATION 01/26/2008   Qualifier: Diagnosis of  By: Lenna Gilford MD, Deborra Medina   . Osteoarthritis 03/12/2007  . Osteoporosis 03/12/2007   Qualifier: Diagnosis of  By: Julien Girt CMA, Marliss Czar  06/15/14 dc Vit D 1000u po daily per pharm.    . Phlebitis and thrombophlebitis of other deep vessels of lower extremities 07/10/2010  . S/P balloon dilatation of esophageal stricture 07/28/2013   07/26/13. Dr. Henrene Pastor. EGD foreign removal and Balloon dilation of esophagus. Esophageal dysmotility, suspected achalasia, dilation at the GE junction to 51mm. Candida esophagitis.    . SYNCOPE 07/30/2009   Occurred 2011. Hospitalized. Felt due to dehydration.    . TRANSIENT ISCHEMIC ATTACKS, HX OF 03/12/2007   Qualifier: Diagnosis of  By: Julien Girt CMA, Marliss Czar     Past Surgical History:  Procedure Laterality Date  . ABDOMINAL HYSTERECTOMY     and bso  . APPENDECTOMY    . BOTOX INJECTION N/A 09/18/2013   Procedure: BOTOX INJECTION;  Surgeon: Lafayette Dragon, MD;  Location: [facility];  Service: Endoscopy;  Laterality: N/A;  . BOTOX INJECTION N/A 10/16/2014   Procedure: BOTOX INJECTION;  Surgeon: Inda Castle, MD;  Location: [facility];  Service: Endoscopy;  Laterality: N/A;  . BOTOX INJECTION N/A 05/18/2016   Procedure: BOTOX INJECTION;  Surgeon: Milus Banister, MD;  Location: [facility];  Service: Endoscopy;  Laterality: N/A;  . CATARACT EXTRACTION W/ INTRAOCULAR LENS  IMPLANT, BILATERAL    . CHOLECYSTECTOMY    . ESOPHAGEAL MANOMETRY N/A 08/25/2013   Procedure: ESOPHAGEAL MANOMETRY (EM);  Surgeon: Jerene Bears, MD;  Location: [facility];   Service: Gastroenterology;  Laterality: N/A;  . ESOPHAGOGASTRODUODENOSCOPY N/A 07/26/2013   Procedure: ESOPHAGOGASTRODUODENOSCOPY (EGD);  Surgeon: Irene Shipper, MD;  Location: [facility] ENDOSCOPY;  Service: Endoscopy;  Laterality: N/A;  . ESOPHAGOGASTRODUODENOSCOPY N/A 10/16/2014   Procedure: ESOPHAGOGASTRODUODENOSCOPY (EGD);  Surgeon: Inda Castle, MD;  Location: [facility];  Service: Endoscopy;  Laterality: N/A;  with botox  . ESOPHAGOGASTRODUODENOSCOPY (EGD) WITH ESOPHAGEAL DILATION N/A 11/25/2012   Procedure: ESOPHAGOGASTRODUODENOSCOPY (EGD) WITH ESOPHAGEAL DILATION;  Surgeon: Jerene Bears, MD;  Location: [facility];  Service: Gastroenterology;  Laterality: N/A;  . ESOPHAGOGASTRODUODENOSCOPY (EGD) WITH PROPOFOL N/A 09/18/2013   Procedure: ESOPHAGOGASTRODUODENOSCOPY (EGD) WITH PROPOFOL;  Surgeon: Lafayette Dragon, MD;  Location: [facility];  Service: Endoscopy;  Laterality: N/A;  . ESOPHAGOGASTRODUODENOSCOPY (EGD) WITH PROPOFOL N/A 05/18/2016   Procedure: ESOPHAGOGASTRODUODENOSCOPY (EGD) WITH PROPOFOL;  Surgeon: Milus Banister, MD;  Location: [facility];  Service: Endoscopy;  Laterality: N/A;  . HIP ARTHROPLASTY Right 09/09/2012   Procedure: ARTHROPLASTY BIPOLAR HIP;  Surgeon: Mauri Pole, MD;  Location: [facility];  Service: Orthopedics;  Laterality: Right;  . TAH and BSO  08/21/1995  Allergies  Allergen Reactions  . Sulfamethoxazole Rash    Outpatient Encounter Prescriptions as of 12/25/2016  Medication Sig  . acetaminophen (TYLENOL) 325 MG tablet Take 650 mg by mouth every 4 (four) hours as needed for moderate pain.   Marland Kitchen albuterol (PROVENTIL HFA;VENTOLIN HFA) 108 (90 BASE) MCG/ACT inhaler Inhale 2 puffs into the lungs every 6 (six) hours as needed for wheezing or shortness of breath.  Marland Kitchen atorvastatin (LIPITOR) 10 MG tablet Take 10 mg by mouth daily.  . CHLORHEXIDINE GLUCONATE, BULK, SOLN Take 5 mLs by mouth daily. 5 ml swish and spit for 30 seconds every day indefinitely   .  cholecalciferol (VITAMIN D) 1000 units tablet Take 1,000 Units by mouth daily.  . feeding supplement (RESOURCE BREEZE) LIQD Take 90 mLs by mouth 2 (two) times daily.   . fluticasone furoate-vilanterol (BREO ELLIPTA) 100-25 MCG/INH AEPB Inhale 1 puff into the lungs daily. One puff inhale once a day, rinse mouth after each use.   . Humidifier MISC Please use humidifier to prevent excessive dryness.  Marland Kitchen HYDROcodone-acetaminophen (NORCO) 10-325 MG per tablet Take 1 tablet by mouth 4 (four) times daily.  . hydroxyurea (HYDREA) 500 MG capsule Take 500-1,000 mg by mouth See admin instructions. Takes 1000 mg on Monday only Takes 500 mg on Sunday, Wednesday and Friday only  . memantine (NAMENDA) 10 MG tablet Take 10 mg by mouth 2 (two) times daily.   . metoprolol tartrate (LOPRESSOR) 25 MG tablet Take 25 mg by mouth 2 (two) times daily.  . montelukast (SINGULAIR) 10 MG tablet Take 10 mg by mouth at bedtime.  . Multiple Vitamins-Minerals (PRESERVISION AREDS 2) CAPS Take 1 capsule by mouth 2 (two) times daily.  Marland Kitchen NIFEdipine (PROCARDIA) 10 MG capsule Take 10 mg by mouth 3 (three) times daily. Squeeze contents of one capsule uner tongue 15-30 minutes before meals 3 times a day   . nitroGLYCERIN (NITROSTAT) 0.4 MG SL tablet Place 0.4 mg under the tongue every 5 (five) minutes as needed for chest pain.  Marland Kitchen omeprazole (PRILOSEC) 20 MG capsule Take 20 mg by mouth at bedtime.   . Rivaroxaban (XARELTO) 15 MG TABS tablet Take 15 mg by mouth every evening. 5pm  . sertraline (ZOLOFT) 25 MG tablet Take 12.5 mg by mouth daily.  . sodium fluoride (DENTA 5000 PLUS) 1.1 % CREA dental cream Place 1 application onto teeth at bedtime.   No facility-administered encounter medications on file as of 12/25/2016.     Review of Systems  Constitutional: Negative for activity change, appetite change, chills, diaphoresis, fatigue and fever.  HENT: Positive for hearing loss. Negative for congestion, sinus pressure, sneezing and sore  throat.   Eyes: Negative for visual disturbance.  Respiratory: Positive for cough. Negative for choking, chest tightness, shortness of breath and wheezing.   Cardiovascular: Negative for chest pain, palpitations and leg swelling.  Gastrointestinal: Negative for abdominal distention, abdominal pain, constipation, diarrhea, nausea and vomiting.  Genitourinary: Negative for difficulty urinating and dysuria.       Continent of urine, occasional leakage.   Musculoskeletal: Positive for back pain and gait problem.  Skin: Negative for color change, pallor, rash and wound.  Neurological: Negative for tremors, speech difficulty, weakness and headaches.  Psychiatric/Behavioral: Positive for confusion. Negative for agitation, behavioral problems, hallucinations and sleep disturbance.    Immunization History  Administered Date(s) Administered  . Influenza Split 11/16/2010, 12/13/2011  . Influenza Whole 11/27/2009  . Influenza-Unspecified 12/31/2013, 11/18/2014, 12/14/2015  . PPD Test 09/11/2012  . Pneumococcal Polysaccharide-23  02/28/1988   Pertinent  Health Maintenance Due  Topic Date Due  . INFLUENZA VACCINE  09/27/2016  . PNA vac Low Risk Adult (2 of 2 - PCV13) 02/27/2017 (Originally 02/27/1989)  . DEXA SCAN  Completed   Fall Risk  10/04/2015 12/28/2014 10/23/2014 11/24/2013  Falls in the past year? No Yes Yes Yes  Number falls in past yr: - 1 1 2  or more  Injury with Fall? - No No -  Risk for fall due to : Impaired mobility;Impaired balance/gait History of fall(s);Impaired balance/gait;Impaired mobility Impaired mobility History of fall(s);Impaired balance/gait;Impaired vision;Impaired mobility  Follow up - Falls prevention discussed Falls evaluation completed -   Functional Status Survey:    Vitals:   12/25/16 1120  BP: (!) 102/56  Pulse: 70  Resp: 20  Temp: 98.7 F (37.1 C)  Weight: 100 lb 3.2 oz (45.5 kg)  Height: 5\' 1"  (1.549 m)   Body mass index is 18.93 kg/m. Physical Exam    Constitutional: She appears well-developed and well-nourished.  HENT:  Head: Normocephalic and atraumatic.  Eyes: Pupils are equal, round, and reactive to light. Conjunctivae and EOM are normal.  Neck: Normal range of motion. Neck supple. No JVD present.  Cardiovascular: Normal rate, regular rhythm and normal heart sounds.   Pulmonary/Chest: Effort normal and breath sounds normal. She has no wheezes. She has no rales.  Decreased air entry  Abdominal: Soft. Bowel sounds are normal. She exhibits no distension. There is no tenderness.  Musculoskeletal: Normal range of motion. She exhibits no edema or tenderness.  Kyphosis and scoliosis. Self transfer.   Neurological: She is alert. She has normal reflexes. She exhibits normal muscle tone. Coordination normal.  Oriented to person and place.   Skin: Skin is warm and dry. No rash noted. No erythema. No pallor.  Psychiatric: She has a normal mood and affect. Her behavior is normal. Thought content normal.    Labs reviewed:  Recent Labs  02/10/16 1530 03/09/16 06/13/16  NA 139 143 139  K 4.3 3.9 4.2  CL 101  --   --   GLUCOSE 113*  --   --   BUN 24* 17 17  CREATININE 0.90 0.7 0.7    Recent Labs  03/09/16 06/13/16  AST 17 14  ALT 10 9  ALKPHOS 68 63    Recent Labs  07/04/16 09/05/16 11/07/16  WBC 5.7 6.3 5.6  HGB 10.7* 10.9* 11.6*  HCT 32* 33* 34*  PLT 312 305 279   Lab Results  Component Value Date   TSH 2.00 09/21/2016   Lab Results  Component Value Date   HGBA1C 5.5 10/16/2014   Lab Results  Component Value Date   CHOL 118 08/10/2016   HDL 46 08/10/2016   LDLCALC 51 08/10/2016   LDLDIRECT 152.3 06/04/2009   TRIG 128 08/10/2016   CHOLHDL 3 09/13/2011    Significant Diagnostic Results in last 30 days:  No results found.  Assessment/Plan BACK PAIN, LUMBAR chronic back pain, no change, continue Norco 10/325mg  tid/qid from 11/24/16  Osteoporosis Vit D 1000u daily added 12/24/16.  Chronic depression Her  mood is stable, continue Sertraline 12.5mg  po qd since 10/24/16.  Long term current use of anticoagulant therapy Hx of DVT, on chronic Rivaroxaban 15mg  qd.  Essential hypertension, benign Her blood pressure is controlled, continue Nifedipine 10mg  tid, Metoprolol 25mg  bid.   Achalasia of esophagus 09/18/13 Nifedipine 10mg  tid ac meals. 11/17/14 [facility] f/u as need 05/18/16 endoscopy. Stable. Continue Nifedipine.   Alzheimer's dementia  without behavioral disturbance She resides in SNF, functioning well, going around in wheelchair, continue  Memantine for memory,  Essential thrombocythemia ([place])  thrombocythemia,  is managed with Hydroxyurea per Hematology, last Plt 305 09/05/16  COPD with asthma ([place]) COPD, stable, continue prn albuterol HFA q6h, Bero ellipta daily, Singulair 10mg  daily       Family/ staff Communication: plan of care reviewed with the patient and charge nurse.   Labs/tests ordered:  none  Time spend 25 minutes

## 2016-12-25 NOTE — Assessment & Plan Note (Signed)
Hx of DVT, on chronic Rivaroxaban 15mg  qd.

## 2016-12-25 NOTE — Assessment & Plan Note (Signed)
Her mood is stable, continue Sertraline 12.5mg  po qd since 10/24/16.

## 2017-01-02 LAB — CBC AND DIFFERENTIAL
HCT: 36 (ref 36–46)
HEMATOCRIT: 36 (ref 36–46)
HEMOGLOBIN: 12.7 (ref 12.0–16.0)
Hemoglobin: 12.7 (ref 12.0–16.0)
Platelets: 272 (ref 150–399)
WBC: 6.2
WBC: 6.2

## 2017-01-03 ENCOUNTER — Other Ambulatory Visit: Payer: Self-pay | Admitting: *Deleted

## 2017-01-10 ENCOUNTER — Other Ambulatory Visit: Payer: Self-pay | Admitting: *Deleted

## 2017-01-10 ENCOUNTER — Telehealth: Payer: Self-pay | Admitting: *Deleted

## 2017-01-10 NOTE — Telephone Encounter (Signed)
Return call from [facility] [phone]). Dr Beryle Beams received lab results (collected 11/6)- "Stay on same dose Hydrea" ; order given to Earley Favor, RN. Results to be scanned.

## 2017-01-25 ENCOUNTER — Non-Acute Institutional Stay (SKILLED_NURSING_FACILITY): Payer: Medicare Other | Admitting: Internal Medicine

## 2017-01-25 ENCOUNTER — Encounter: Payer: Self-pay | Admitting: Internal Medicine

## 2017-01-25 DIAGNOSIS — F341 Dysthymic disorder: Secondary | ICD-10-CM

## 2017-01-25 DIAGNOSIS — K220 Achalasia of cardia: Secondary | ICD-10-CM | POA: Diagnosis not present

## 2017-01-25 DIAGNOSIS — K219 Gastro-esophageal reflux disease without esophagitis: Secondary | ICD-10-CM | POA: Diagnosis not present

## 2017-01-25 DIAGNOSIS — D473 Essential (hemorrhagic) thrombocythemia: Secondary | ICD-10-CM

## 2017-01-25 DIAGNOSIS — E7800 Pure hypercholesterolemia, unspecified: Secondary | ICD-10-CM | POA: Diagnosis not present

## 2017-01-25 DIAGNOSIS — F329 Major depressive disorder, single episode, unspecified: Secondary | ICD-10-CM

## 2017-01-25 NOTE — Progress Notes (Signed)
Location:  [facility] Room Number: 35 Place of Service:  SNF [phone]) Provider:  Blanchie Serve MD  Blanchie Serve, MD  Patient Care Team: Blanchie Serve, MD as PCP - General (Internal Medicine) Annia Belt, MD as Consulting Physician (Hematology and Oncology) Suella Broad, MD as Consulting Physician (Physical Medicine and Rehabilitation) Noralee Space, MD (Pulmonary Disease) [name], [name], NP as Nurse Practitioner (Nurse Practitioner) Inda Castle, MD as Consulting Physician (Gastroenterology)  Extended Emergency Contact Information Primary Emergency Contact: Heick,Gene Address: [address]          [place], [place] [address] Johnnette Litter of [place] Phone: [phone] Work Phone: [phone] Mobile Phone: [phone] Relation: Son Secondary Emergency Contact: [name], [place] of [place] Phone: [phone] Relation: Son  Code Status:  DNR  Goals of care: Advanced Directive information Advanced Directives 12/25/2016  Does Patient Have a Medical Advance Directive? Yes  Type of Paramedic of [place];Living will;Out of facility DNR (pink MOST or yellow form)  Does patient want to make changes to medical advance directive? No - Patient declined  Copy of [place] in Chart? Yes  Pre-existing out of facility DNR order (yellow form or pink MOST form) Yellow form placed in chart (order not valid for inpatient use)     Chief Complaint  Patient presents with  . Medical Management of Chronic Issues    HPI:  Pt is a 83 y.o. female seen today for medical management of chronic diseases.  No new health concern from patient or nursing staff this visit. Received flu vaccine 12/11/16. BIMS 10/15.    Past Medical History:  Diagnosis Date  . Abnormal weight gain 05/09/2012  . Achalasia of esophagus 09/18/2013   09/18/13  Nifedipine 10mg  tid ac meals. 11/17/14 [facility] f/u as neeed   . Anxiety and depression 03/12/2012   Increased Sertraline to 50mg  daily 07/16/13 08/26/13 Mirtazapine 7.5mg  qhs  10/12/14 Pharm decreased Sertraline to 25mg .  05/04/15 TSH 2.89 09/15/15 pharm taper off Sertraline.  10/08/15 continue Sertraline 25mg  daily, POA desires. 12/02/15 wbc 6.1, Hgb 12.0, plt 271    . Asthmatic bronchitis   . BACK PAIN, LUMBAR 03/12/2007   Hx of lower back pain, had X-ray of her lumbar spine 09/08/12--spondylosis-better controlled with Hydrocodone/ApAp  10/325mg  ac and hs  01/01/13 lumbar spine inj. Able to ambulate with walker on unit until her fall 06/22/13. 06/25/13 unremarkable. X-ray lumbar spine. 11/10/13 dc Oxycodone prn-not used in 60 days.     . COLONIC POLYPS 03/12/2007   Initially noted April 2002 colonoscopy. Followup colonoscopy 05/31/2006 did not disclose any further polyps.    Marland Kitchen COPD (chronic obstructive pulmonary disease) ([place])   . Coronary artery disease   . Deep vein thrombosis of left lower extremity ([place]) 07/19/2012  . Dementia 09/16/2012   05/04/15 TSH 2.89   . Depression   . Diverticulosis 05/10/2012  . DJD (degenerative joint disease)   . Dry skin dermatitis 11/18/2012  . Edema of leg 07/01/2010  . Epistaxis 01/24/2016   01/21/16: x2, held Xarelto x 1 day, Saline Nasal spray prn, wbc 9.5, Hgb 12.4, Plt 331, Na 138, K 4.5, Bun 25, creat 0.89 01/22/16 HUT65(46-50)  . Esophageal dysmotility 07/26/2013  . Esophageal dysphagia 05/27/2010   New dx of achalasia 6/ 2015 09/18/13 Nifedipine 10mg  tid ac meals.    . Esophageal stricture   .  Essential hypertension, benign 10/30/2013   12/02/15 wbc 6.1, Hgb 12.0, plt 271   . Essential thrombocythemia ([place]) 06/09/2009  . Fall 06/25/2013  . GERD (gastroesophageal reflux disease)   . Hip fracture ([place]) 09/08/2012  . HYPERCHOLESTEROLEMIA 03/12/2007   08/10/15 cholesterol 119, triglycerides 143, HDL 51, LDL 39   . Lumbar back pain   . MACULAR DEGENERATION 01/26/2008    Qualifier: Diagnosis of  By: Lenna Gilford MD, Deborra Medina   . Osteoarthritis 03/12/2007  . Osteoporosis 03/12/2007   Qualifier: Diagnosis of  By: Julien Girt CMA, Marliss Czar  06/15/14 dc Vit D 1000u po daily per pharm.    . Phlebitis and thrombophlebitis of other deep vessels of lower extremities 07/10/2010  . S/P balloon dilatation of esophageal stricture 07/28/2013   07/26/13. Dr. Henrene Pastor. EGD foreign removal and Balloon dilation of esophagus. Esophageal dysmotility, suspected achalasia, dilation at the GE junction to 24mm. Candida esophagitis.    . SYNCOPE 07/30/2009   Occurred 2011. Hospitalized. Felt due to dehydration.    . TRANSIENT ISCHEMIC ATTACKS, HX OF 03/12/2007   Qualifier: Diagnosis of  By: Julien Girt CMA, Marliss Czar     Past Surgical History:  Procedure Laterality Date  . ABDOMINAL HYSTERECTOMY     and bso  . APPENDECTOMY    . BOTOX INJECTION N/A 09/18/2013   Procedure: BOTOX INJECTION;  Surgeon: Lafayette Dragon, MD;  Location: [facility];  Service: Endoscopy;  Laterality: N/A;  . BOTOX INJECTION N/A 10/16/2014   Procedure: BOTOX INJECTION;  Surgeon: Inda Castle, MD;  Location: [facility];  Service: Endoscopy;  Laterality: N/A;  . BOTOX INJECTION N/A 05/18/2016   Procedure: BOTOX INJECTION;  Surgeon: Milus Banister, MD;  Location: [facility];  Service: Endoscopy;  Laterality: N/A;  . CATARACT EXTRACTION W/ INTRAOCULAR LENS  IMPLANT, BILATERAL    . CHOLECYSTECTOMY    . ESOPHAGEAL MANOMETRY N/A 08/25/2013   Procedure: ESOPHAGEAL MANOMETRY (EM);  Surgeon: Jerene Bears, MD;  Location: [facility];  Service: Gastroenterology;  Laterality: N/A;  . ESOPHAGOGASTRODUODENOSCOPY N/A 07/26/2013   Procedure: ESOPHAGOGASTRODUODENOSCOPY (EGD);  Surgeon: Irene Shipper, MD;  Location: [facility] ENDOSCOPY;  Service: Endoscopy;  Laterality: N/A;  . ESOPHAGOGASTRODUODENOSCOPY N/A 10/16/2014   Procedure: ESOPHAGOGASTRODUODENOSCOPY (EGD);  Surgeon: Inda Castle, MD;  Location: [facility];  Service: Endoscopy;  Laterality: N/A;  with  botox  . ESOPHAGOGASTRODUODENOSCOPY (EGD) WITH ESOPHAGEAL DILATION N/A 11/25/2012   Procedure: ESOPHAGOGASTRODUODENOSCOPY (EGD) WITH ESOPHAGEAL DILATION;  Surgeon: Jerene Bears, MD;  Location: [facility];  Service: Gastroenterology;  Laterality: N/A;  . ESOPHAGOGASTRODUODENOSCOPY (EGD) WITH PROPOFOL N/A 09/18/2013   Procedure: ESOPHAGOGASTRODUODENOSCOPY (EGD) WITH PROPOFOL;  Surgeon: Lafayette Dragon, MD;  Location: [facility];  Service: Endoscopy;  Laterality: N/A;  . ESOPHAGOGASTRODUODENOSCOPY (EGD) WITH PROPOFOL N/A 05/18/2016   Procedure: ESOPHAGOGASTRODUODENOSCOPY (EGD) WITH PROPOFOL;  Surgeon: Milus Banister, MD;  Location: [facility];  Service: Endoscopy;  Laterality: N/A;  . HIP ARTHROPLASTY Right 09/09/2012   Procedure: ARTHROPLASTY BIPOLAR HIP;  Surgeon: Mauri Pole, MD;  Location: [facility];  Service: Orthopedics;  Laterality: Right;  . TAH and BSO  08/21/1995    Allergies  Allergen Reactions  . Sulfamethoxazole Rash    Outpatient Encounter Medications as of 01/25/2017  Medication Sig  . acetaminophen (TYLENOL) 325 MG tablet Take 650 mg by mouth every 4 (four) hours as needed for moderate pain.   Marland Kitchen albuterol (PROVENTIL HFA;VENTOLIN HFA) 108 (90 BASE) MCG/ACT inhaler Inhale 2 puffs into the lungs every 6 (six) hours as needed for wheezing or shortness  of breath.  Marland Kitchen atorvastatin (LIPITOR) 10 MG tablet Take 10 mg by mouth daily.  . CHLORHEXIDINE GLUCONATE, BULK, SOLN Take 5 mLs by mouth daily. 5 ml swish and spit for 30 seconds every day indefinitely   . cholecalciferol (VITAMIN D) 1000 units tablet Take 1,000 Units by mouth daily.  . feeding supplement (RESOURCE BREEZE) LIQD Take 90 mLs by mouth 2 (two) times daily.   . fluticasone furoate-vilanterol (BREO ELLIPTA) 100-25 MCG/INH AEPB Inhale 1 puff into the lungs daily. One puff inhale once a day, rinse mouth after each use.   . Humidifier MISC Please use humidifier to prevent excessive dryness.  Marland Kitchen HYDROcodone-acetaminophen (NORCO)  10-325 MG per tablet Take 1 tablet by mouth 4 (four) times daily.  . hydroxyurea (HYDREA) 500 MG capsule Take 500-1,000 mg by mouth See admin instructions. Takes 1000 mg on Monday only Takes 500 mg on Sunday, Wednesday and Friday only  . memantine (NAMENDA) 10 MG tablet Take 10 mg by mouth 2 (two) times daily.   . metoprolol tartrate (LOPRESSOR) 25 MG tablet Take 25 mg by mouth 2 (two) times daily.  . montelukast (SINGULAIR) 10 MG tablet Take 10 mg by mouth at bedtime.  . Multiple Vitamins-Minerals (PRESERVISION AREDS 2) CAPS Take 1 capsule by mouth 2 (two) times daily.  Marland Kitchen NIFEdipine (PROCARDIA) 10 MG capsule Take 10 mg by mouth 3 (three) times daily. Squeeze contents of one capsule uner tongue 15-30 minutes before meals 3 times a day   . nitroGLYCERIN (NITROSTAT) 0.4 MG SL tablet Place 0.4 mg under the tongue every 5 (five) minutes as needed for chest pain.  Marland Kitchen omeprazole (PRILOSEC) 20 MG capsule Take 20 mg by mouth at bedtime.   . Rivaroxaban (XARELTO) 15 MG TABS tablet Take 15 mg by mouth every evening. 5pm  . sertraline (ZOLOFT) 25 MG tablet Take 12.5 mg by mouth daily.  . sodium fluoride (DENTA 5000 PLUS) 1.1 % CREA dental cream Place 1 application onto teeth at bedtime.   No facility-administered encounter medications on file as of 01/25/2017.     Review of Systems  Constitutional: Negative for appetite change, chills and fever.  HENT: Positive for hearing loss and trouble swallowing. Negative for congestion, mouth sores and sinus pain.        Hearing aid present. receives routine botox injection with her dysphagia  Eyes: Negative for pain and redness.       Corrective glassses  Respiratory: Negative for cough and shortness of breath.        Has chronic asthma, no recent exacerbation, some dyspnea with exertion  Cardiovascular: Negative for chest pain and palpitations.  Gastrointestinal: Negative for abdominal pain, constipation, diarrhea, nausea and vomiting.  Genitourinary: Negative  for dysuria.       Incontinent with bowel and bladder.  Musculoskeletal: Positive for gait problem. Negative for back pain.       Ambulates with wheelchair, no fall reported. Needs assistance with transfer. Feeds herself. Needs assistance with bathing and dressing.      Skin: Negative for rash and wound.  Neurological: Negative for syncope, light-headedness, numbness and headaches.  Hematological: Bruises/bleeds easily.  Psychiatric/Behavioral: Positive for confusion. Negative for behavioral problems.    Immunization History  Administered Date(s) Administered  . Influenza Split 11/16/2010, 12/13/2011  . Influenza Whole 11/27/2009  . Influenza-Unspecified 12/31/2013, 11/18/2014, 12/14/2015, 12/11/2016  . PPD Test 09/11/2012  . Pneumococcal Polysaccharide-23 02/28/1988   Pertinent  Health Maintenance Due  Topic Date Due  . PNA vac Low Risk Adult (  2 of 2 - PCV13) 02/27/2017 (Originally 02/27/1989)  . INFLUENZA VACCINE  Completed  . DEXA SCAN  Completed   Fall Risk  10/04/2015 12/28/2014 10/23/2014 11/24/2013  Falls in the past year? No Yes Yes Yes  Number falls in past yr: - 1 1 2  or more  Injury with Fall? - No No -  Risk for fall due to : Impaired mobility;Impaired balance/gait History of fall(s);Impaired balance/gait;Impaired mobility Impaired mobility History of fall(s);Impaired balance/gait;Impaired vision;Impaired mobility  Follow up - Falls prevention discussed Falls evaluation completed -   Functional Status Survey:    Vitals:   01/25/17 0856  BP: (!) 112/50  Pulse: 68  Resp: 18  Weight: 99 lb (44.9 kg)  Height: 5\' 1"  (1.549 m)   Body mass index is 18.71 kg/m.   Wt Readings from Last 3 Encounters:  01/25/17 99 lb (44.9 kg)  12/25/16 100 lb 3.2 oz (45.5 kg)  11/24/16 99 lb 6.4 oz (45.1 kg)   Physical Exam  Constitutional:  Thin built, frail, low BMI, in no acute distress  HENT:  Head: Normocephalic and atraumatic.  Mouth/Throat: Oropharynx is clear and moist.    Hearing aid  Eyes: Conjunctivae and EOM are normal. Pupils are equal, round, and reactive to light. Left eye exhibits no discharge.  Corrective glasses  Neck: Normal range of motion. Neck supple.  Cardiovascular: Normal rate and regular rhythm.  Pulmonary/Chest: Effort normal and breath sounds normal. No respiratory distress. She has no wheezes. She has no rales. She exhibits no tenderness.  Abdominal: Soft. Bowel sounds are normal. There is no tenderness.  Musculoskeletal: She exhibits no edema or tenderness.  Can move all 4 extremities, unsteady gait, needs assistance with transfers, ambulates with wheelchair. Kyphosis present.   Lymphadenopathy:    She has no cervical adenopathy.  Neurological: She is alert.  Oriented to self, place and time but not to person this visit  Skin: Skin is warm and dry. No rash noted. She is not diaphoretic.  Psychiatric: She has a normal mood and affect. Her behavior is normal.    Labs reviewed: Recent Labs    02/10/16 1530 03/09/16 06/13/16  NA 139 143 139  K 4.3 3.9 4.2  CL 101  --   --   GLUCOSE 113*  --   --   BUN 24* 17 17  CREATININE 0.90 0.7 0.7   Recent Labs    03/09/16 06/13/16  AST 17 14  ALT 10 9  ALKPHOS 68 63   Recent Labs    09/05/16 11/07/16 01/02/17  WBC 6.3 5.6 6.2  6.2  HGB 10.9* 11.6* 12.7  12.7  HCT 33* 34* 36  36  PLT 305 279 272   Lab Results  Component Value Date   TSH 2.00 09/21/2016   Lab Results  Component Value Date   HGBA1C 5.5 10/16/2014   Lab Results  Component Value Date   CHOL 118 08/10/2016   HDL 46 08/10/2016   LDLCALC 51 08/10/2016   LDLDIRECT 152.3 06/04/2009   TRIG 128 08/10/2016   CHOLHDL 3 09/13/2011    Significant Diagnostic Results in last 30 days:  No results found.  Assessment/Plan  Moderate protein calorie malnutrition Low weight, stable. Continue feeding supplement.  gerd Denies any symptom. continue omeprazole 20 mg qhs with her on long term anticoagulation and her  history of esophageal dysmotility with GERD  Long term anticoagulation Continue xarelto for stroke prevention with history of DVT and essential thrombocythemia.   Achalasia Stable, gets  botox on regular interval, denies any trouble with swallowing this visit.   Hyperlipidemia Lipid Panel     Component Value Date/Time   CHOL 118 08/10/2016   TRIG 128 08/10/2016   HDL 46 08/10/2016   CHOLHDL 3 09/13/2011 1023   VLDL 27.4 09/13/2011 1023   LDLCALC 51 08/10/2016   LDLDIRECT 152.3 06/04/2009 0939   LDL at goal. Continue atorvastatin 10 mg daily.   Chronic depression Mood remains good. Will attempt further GDR of sertraline to 12. 5 mg every other day for 2 weeks and stopping it. Monitor her mood.    Family/ staff Communication: reviewed care plan with patient and charge nurse.    Labs/tests ordered: cmp next lab   [facility] [place], MD Internal Medicine [place], [place] [address] Cell Phone (Monday-Friday 8 am - 5 pm): [phone] On Call: [phone] and follow prompts after 5 pm and on weekends Office Phone: [phone] Office Fax: [phone]

## 2017-01-30 DIAGNOSIS — I10 Essential (primary) hypertension: Secondary | ICD-10-CM | POA: Diagnosis not present

## 2017-01-30 LAB — BASIC METABOLIC PANEL
BUN: 19 (ref 4–21)
Creatinine: 0.8 (ref 0.5–1.1)
Glucose: 124
POTASSIUM: 4.3 (ref 3.4–5.3)
SODIUM: 139 (ref 137–147)

## 2017-01-30 LAB — HEPATIC FUNCTION PANEL
ALK PHOS: 68 (ref 25–125)
ALT: 11 (ref 7–35)
AST: 17 (ref 13–35)
Bilirubin, Total: 0.6

## 2017-02-08 ENCOUNTER — Encounter: Payer: Self-pay | Admitting: Internal Medicine

## 2017-02-08 ENCOUNTER — Non-Acute Institutional Stay: Payer: Medicare Other | Admitting: Internal Medicine

## 2017-02-08 DIAGNOSIS — Z0000 Encounter for general adult medical examination without abnormal findings: Secondary | ICD-10-CM | POA: Diagnosis not present

## 2017-02-08 NOTE — Progress Notes (Signed)
Subjective:   Sherri Crawford is a 83 y.o. female who presents for an Initial Medicare Annual Wellness Visit.    Cardiac Risk Factors include: advanced age (>54men, >91 women);sedentary lifestyle;dyslipidemia;hypertension     Objective:    Today's Vitals   02/08/17 1131  BP: 126/74  Pulse: 70  Resp: 18  Temp: 98 F (36.7 C)  TempSrc: Oral  Weight: 100 lb 3.2 oz (45.5 kg)  Height: 5\' 1"  (1.549 m)   Body mass index is 18.93 kg/m.  Advanced Directives 02/08/2017 12/25/2016 11/24/2016 10/24/2016 09/25/2016 09/14/2016 07/10/2016  Does Patient Have a Medical Advance Directive? Yes Yes Yes Yes Yes Yes Yes  Type of Paramedic of [place];Living will;Out of facility DNR (pink MOST or yellow form) [place];Living will;Out of facility DNR (pink MOST or yellow form) [place];Living will;Out of facility DNR (pink MOST or yellow form) Out of facility DNR (pink MOST or yellow form);Healthcare Power of [place];Out of facility DNR (pink MOST or yellow form) [place];Out of facility DNR (pink MOST or yellow form);Living will [place];Living will;Out of facility DNR (pink MOST or yellow form)  Does patient want to make changes to medical advance directive? No - Patient declined No - Patient declined No - Patient declined No - Patient declined No - Patient declined No - Patient declined No - Patient declined  Copy of [place] in Chart? Yes Yes Yes Yes Yes Yes Yes  Pre-existing out of facility DNR order (yellow form or pink MOST form) Yellow form placed in chart (order not valid for inpatient use) Yellow form placed in chart (order not valid for inpatient use) Yellow form placed in chart (order not valid for inpatient use) Yellow form placed in chart (order not valid for inpatient use) Yellow form placed in chart (order not valid for inpatient use) Yellow  form placed in chart (order not valid for inpatient use) Yellow form placed in chart (order not valid for inpatient use)    Current Medications (verified) Outpatient Encounter Medications as of 02/08/2017  Medication Sig  . acetaminophen (TYLENOL) 325 MG tablet Take 650 mg by mouth every 4 (four) hours as needed for moderate pain.   Marland Kitchen albuterol (PROVENTIL HFA;VENTOLIN HFA) 108 (90 BASE) MCG/ACT inhaler Inhale 2 puffs into the lungs every 6 (six) hours as needed for wheezing or shortness of breath.  Marland Kitchen atorvastatin (LIPITOR) 10 MG tablet Take 10 mg by mouth daily.  . CHLORHEXIDINE GLUCONATE, BULK, SOLN Take 5 mLs by mouth daily. 5 ml swish and spit for 30 seconds every day indefinitely   . cholecalciferol (VITAMIN D) 1000 units tablet Take 1,000 Units by mouth daily.  . feeding supplement (RESOURCE BREEZE) LIQD Take 90 mLs by mouth 2 (two) times daily.   . fluticasone furoate-vilanterol (BREO ELLIPTA) 100-25 MCG/INH AEPB Inhale 1 puff into the lungs daily. One puff inhale once a day, rinse mouth after each use.   . Humidifier MISC Please use humidifier to prevent excessive dryness.  Marland Kitchen HYDROcodone-acetaminophen (NORCO) 10-325 MG per tablet Take 1 tablet by mouth 3 (three) times daily.   . hydroxyurea (HYDREA) 500 MG capsule Take 500-1,000 mg by mouth See admin instructions. Takes 1000 mg on Monday only Takes 500 mg on Sunday, Wednesday and Friday only  . memantine (NAMENDA) 10 MG tablet Take 10 mg by mouth 2 (two) times daily.   . metoprolol tartrate (LOPRESSOR) 25 MG tablet  Take 25 mg by mouth 2 (two) times daily.  . montelukast (SINGULAIR) 10 MG tablet Take 10 mg by mouth at bedtime.  . Multiple Vitamins-Minerals (PRESERVISION AREDS 2) CAPS Take 1 capsule by mouth 2 (two) times daily.  Marland Kitchen NIFEdipine (PROCARDIA) 10 MG capsule Take 10 mg by mouth 3 (three) times daily. Squeeze contents of one capsule uner tongue 15-30 minutes before meals 3 times a day   . nitroGLYCERIN (NITROSTAT) 0.4 MG SL tablet  Place 0.4 mg under the tongue every 5 (five) minutes as needed for chest pain.  Marland Kitchen omeprazole (PRILOSEC) 20 MG capsule Take 20 mg by mouth at bedtime.   . Rivaroxaban (XARELTO) 15 MG TABS tablet Take 15 mg by mouth every evening. 5pm  . sertraline (ZOLOFT) 25 MG tablet Take 12.5 mg by mouth every other day.   . sodium fluoride (DENTA 5000 PLUS) 1.1 % CREA dental cream Place 1 application onto teeth at bedtime.   No facility-administered encounter medications on file as of 02/08/2017.     Allergies (verified) Sulfamethoxazole   History: Past Medical History:  Diagnosis Date  . Abnormal weight gain 05/09/2012  . Achalasia of esophagus 09/18/2013   09/18/13 Nifedipine 10mg  tid ac meals. 11/17/14 [facility] f/u as neeed   . Anxiety and depression 03/12/2012   Increased Sertraline to 50mg  daily 07/16/13 08/26/13 Mirtazapine 7.5mg  qhs  10/12/14 Pharm decreased Sertraline to 25mg .  05/04/15 TSH 2.89 09/15/15 pharm taper off Sertraline.  10/08/15 continue Sertraline 25mg  daily, POA desires. 12/02/15 wbc 6.1, Hgb 12.0, plt 271    . Asthmatic bronchitis   . BACK PAIN, LUMBAR 03/12/2007   Hx of lower back pain, had X-ray of her lumbar spine 09/08/12--spondylosis-better controlled with Hydrocodone/ApAp  10/325mg  ac and hs  01/01/13 lumbar spine inj. Able to ambulate with walker on unit until her fall 06/22/13. 06/25/13 unremarkable. X-ray lumbar spine. 11/10/13 dc Oxycodone prn-not used in 60 days.     . COLONIC POLYPS 03/12/2007   Initially noted April 2002 colonoscopy. Followup colonoscopy 05/31/2006 did not disclose any further polyps.    Marland Kitchen COPD (chronic obstructive pulmonary disease) ([place])   . Coronary artery disease   . Deep vein thrombosis of left lower extremity ([place]) 07/19/2012  . Dementia 09/16/2012   05/04/15 TSH 2.89   . Depression   . Diverticulosis 05/10/2012  . DJD (degenerative joint disease)   . Dry skin dermatitis 11/18/2012  . Edema of leg 07/01/2010  . Epistaxis 01/24/2016   01/21/16: x2, held Xarelto x 1  day, Saline Nasal spray prn, wbc 9.5, Hgb 12.4, Plt 331, Na 138, K 4.5, Bun 25, creat 0.89 01/22/16 UMP53(61-44)  . Esophageal dysmotility 07/26/2013  . Esophageal dysphagia 05/27/2010   New dx of achalasia 6/ 2015 09/18/13 Nifedipine 10mg  tid ac meals.    . Esophageal stricture   . Essential hypertension, benign 10/30/2013   12/02/15 wbc 6.1, Hgb 12.0, plt 271   . Essential thrombocythemia ([place]) 06/09/2009  . Fall 06/25/2013  . GERD (gastroesophageal reflux disease)   . Hip fracture ([place]) 09/08/2012  . HYPERCHOLESTEROLEMIA 03/12/2007   08/10/15 cholesterol 119, triglycerides 143, HDL 51, LDL 39   . Lumbar back pain   . MACULAR DEGENERATION 01/26/2008   Qualifier: Diagnosis of  By: Lenna Gilford MD, Deborra Medina   . Osteoarthritis 03/12/2007  . Osteoporosis 03/12/2007   Qualifier: Diagnosis of  By: Julien Girt CMA, Marliss Czar  06/15/14 dc Vit D 1000u po daily per pharm.    . Phlebitis and thrombophlebitis of other deep vessels  of lower extremities 07/10/2010  . S/P balloon dilatation of esophageal stricture 07/28/2013   07/26/13. Dr. Henrene Pastor. EGD foreign removal and Balloon dilation of esophagus. Esophageal dysmotility, suspected achalasia, dilation at the GE junction to 85mm. Candida esophagitis.    . SYNCOPE 07/30/2009   Occurred 2011. Hospitalized. Felt due to dehydration.    . TRANSIENT ISCHEMIC ATTACKS, HX OF 03/12/2007   Qualifier: Diagnosis of  By: Julien Girt CMA, Marliss Czar     Past Surgical History:  Procedure Laterality Date  . ABDOMINAL HYSTERECTOMY     and bso  . APPENDECTOMY    . BOTOX INJECTION N/A 09/18/2013   Procedure: BOTOX INJECTION;  Surgeon: Lafayette Dragon, MD;  Location: [facility];  Service: Endoscopy;  Laterality: N/A;  . BOTOX INJECTION N/A 10/16/2014   Procedure: BOTOX INJECTION;  Surgeon: Inda Castle, MD;  Location: [facility];  Service: Endoscopy;  Laterality: N/A;  . BOTOX INJECTION N/A 05/18/2016   Procedure: BOTOX INJECTION;  Surgeon: Milus Banister, MD;  Location: [facility];  Service:  Endoscopy;  Laterality: N/A;  . CATARACT EXTRACTION W/ INTRAOCULAR LENS  IMPLANT, BILATERAL    . CHOLECYSTECTOMY    . ESOPHAGEAL MANOMETRY N/A 08/25/2013   Procedure: ESOPHAGEAL MANOMETRY (EM);  Surgeon: Jerene Bears, MD;  Location: [facility];  Service: Gastroenterology;  Laterality: N/A;  . ESOPHAGOGASTRODUODENOSCOPY N/A 07/26/2013   Procedure: ESOPHAGOGASTRODUODENOSCOPY (EGD);  Surgeon: Irene Shipper, MD;  Location[facility] ENDOSCOPY;  Service: Endoscopy;  Laterality: N/A;  . ESOPHAGOGASTRODUODENOSCOPY N/A 10/16/2014   Procedure: ESOPHAGOGASTRODUODENOSCOPY (EGD);  Surgeon: Inda Castle, MD;  Location: [facility];  Service: Endoscopy;  Laterality: N/A;  with botox  . ESOPHAGOGASTRODUODENOSCOPY (EGD) WITH ESOPHAGEAL DILATION N/A 11/25/2012   Procedure: ESOPHAGOGASTRODUODENOSCOPY (EGD) WITH ESOPHAGEAL DILATION;  Surgeon: Jerene Bears, MD;  Location: [facility];  Service: Gastroenterology;  Laterality: N/A;  . ESOPHAGOGASTRODUODENOSCOPY (EGD) WITH PROPOFOL N/A 09/18/2013   Procedure: ESOPHAGOGASTRODUODENOSCOPY (EGD) WITH PROPOFOL;  Surgeon: Lafayette Dragon, MD;  Location: [facility];  Service: Endoscopy;  Laterality: N/A;  . ESOPHAGOGASTRODUODENOSCOPY (EGD) WITH PROPOFOL N/A 05/18/2016   Procedure: ESOPHAGOGASTRODUODENOSCOPY (EGD) WITH PROPOFOL;  Surgeon: Milus Banister, MD;  Location: [facility];  Service: Endoscopy;  Laterality: N/A;  . HIP ARTHROPLASTY Right 09/09/2012   Procedure: ARTHROPLASTY BIPOLAR HIP;  Surgeon: Mauri Pole, MD;  Location: [facility];  Service: Orthopedics;  Laterality: Right;  . TAH and BSO  08/21/1995   Family History  Problem Relation Age of Onset  . Colon cancer Son 62  . Crohn's disease Son   . Kidney failure Son   . Pancreatitis Son   . AAA (abdominal aortic aneurysm) Son   . Cancer Maternal Aunt    Social History   Socioeconomic History  . Marital status: Widowed    Spouse name: Gwyndolyn Saxon  . Number of children: 3  . Years of education: None  . Highest  education level: None  Social Needs  . Financial resource strain: Not hard at all  . Food insecurity - worry: Patient refused  . Food insecurity - inability: Patient refused  . Transportation needs - medical: Patient refused  . Transportation needs - non-medical: Patient refused  Occupational History  . Occupation: retired    Comment: Veterinary surgeon work    Fish farm manager: RETIRED  Tobacco Use  . Smoking status: Never Smoker  . Smokeless tobacco: Never Used  Substance and Sexual Activity  . Alcohol use: No    Alcohol/week: 0.0 oz  . Drug use: No  . Sexual activity: No  Other Topics Concern  . None  Social History Narrative   Lives at [facility] since 09/11/12   Widowed   Never Smoked   Alcohol none   POA, DNR, Living Will        Tobacco Counseling Counseling given: Not Answered   Clinical Intake:  Pre-visit preparation completed: No  Pain : No/denies pain     BMI - recorded: 18.93 Nutritional Status: BMI <19  Underweight Nutritional Risks: None Diabetes: No  How often do you need to have someone help you when you read instructions, pamphlets, or other written materials from your doctor or pharmacy?: 1 - Never  Interpreter Needed?: No  Information entered by :: Rosenda Geffrard   Activities of Daily Living In your present state of health, do you have any difficulty performing the following activities: 02/08/2017  Hearing? Y  Vision? N  Difficulty concentrating or making decisions? Y  Walking or climbing stairs? Y  Dressing or bathing? Y  Doing errands, shopping? Y  Preparing Food and eating ? Y  Using the Toilet? Y  In the past six months, have you accidently leaked urine? Y  Do you have problems with loss of bowel control? N  Managing your Medications? Y  Managing your Finances? Y  Housekeeping or managing your Housekeeping? Y  Some recent data might be hidden     Immunizations and Health Maintenance Immunization History  Administered Date(s)  Administered  . Influenza Split 11/16/2010, 12/13/2011  . Influenza Whole 11/27/2009  . Influenza-Unspecified 12/31/2013, 11/18/2014, 12/14/2015, 12/11/2016  . PPD Test 09/11/2012  . Pneumococcal Polysaccharide-23 02/28/1988   There are no preventive care reminders to display for this patient.  Patient Care Team: Blanchie Serve, MD as PCP - General (Internal Medicine) Annia Belt, MD as Consulting Physician (Hematology and Oncology) Suella Broad, MD as Consulting Physician (Physical Medicine and Rehabilitation) Noralee Space, MD (Pulmonary Disease) [name], [name], NP as Nurse Practitioner (Nurse Practitioner) Inda Castle, MD (Inactive) as Consulting Physician (Gastroenterology)  Indicate any recent Medical Services you may have received from other than [facility] providers in the past year (date may be approximate).     Assessment:   This is a routine wellness examination for Lind[place].  Hearing/Vision screen Hearing Screening Comments: Has hearing loss and wears hearing aids  Vision Screening Comments: Has corrective glasses  Dietary issues and exercise activities discussed: Current Exercise Habits: The patient does not participate in regular exercise at present, Exercise limited by: neurologic condition(s)  Goals    None     Depression Screen PHQ 2/9 Scores 02/08/2017 10/04/2015 12/28/2014 10/23/2014 11/24/2013  PHQ - 2 Score 0 0 0 0 0    Fall Risk Fall Risk  02/08/2017 02/08/2017 10/04/2015 12/28/2014 10/23/2014  Falls in the past year? No Exclusion - non ambulatory No Yes Yes  Number falls in past yr: - - - 1 1  Injury with Fall? - - - No No  Risk for fall due to : - Impaired balance/gait;Impaired vision Impaired mobility;Impaired balance/gait History of fall(s);Impaired balance/gait;Impaired mobility Impaired mobility  Follow up - - - Falls prevention discussed Falls evaluation completed    Is the patient's home free of loose throw rugs in  walkways, pet beds, electrical cords, etc?   yes      Grab bars in the bathroom? yes      Handrails on the stairs?   NOT APPLICABLE, patient resides in SNF      Adequate lighting?  yes  Timed Get Up and Go Performed cannot be performed, patient is wheelchair bound  Cognitive Function:   MMSE - Mini Mental State Exam 02/08/2017  Orientation to time 4  Orientation to Place 3  Registration 3  Attention/ Calculation 5  Recall 0  Language- name 2 objects 2  Language- repeat 1  Language- follow 3 step command 3  Language- read & follow direction 1  Write a sentence 1  Copy design 1  Total score 24        Screening Tests Health Maintenance  Topic Date Due  . PNA vac Low Risk Adult (2 of 2 - PCV13) 02/27/2017 (Originally 02/27/1987)  . TETANUS/TDAP  08/18/2026 (Originally 12/21/1939)  . INFLUENZA VACCINE  Completed  . DEXA SCAN  Completed    Qualifies for Shingles Vaccine? Yes, facility to receive in Jan 2019 and then provide to residents if approved by family  Cancer Screenings: Lung: Low Dose CT Chest recommended if Age 62-80 years, 30 pack-year currently smoking OR have quit w/in 15years. Patient does not qualify. Breast: Up to date on Mammogram? Yes   Up to date of Bone Density/Dexa? Yes Colorectal: uptodate  Additional Screenings:  Hepatitis B/HIV/Syphillis:not indicated Hepatitis C Screening: not indicated   Plan:     I have personally reviewed and noted the following in the patient's chart:   . Medical and social history . Use of alcohol, tobacco or illicit drugs  . Current medications and supplements . Functional ability and status . Nutritional status . Physical activity . Advanced directives . List of other physicians . Hospitalizations, surgeries, and ER visits in previous 12 months . Vitals . Screenings to include cognitive, depression, and falls . Referrals and appointments  In addition, I have reviewed and discussed with patient certain preventive  protocols, quality metrics, and best practice recommendations. A written personalized care plan for preventive services as well as general preventive health recommendations were provided to patient.     Blanchie Serve, MD   02/08/2017

## 2017-02-16 ENCOUNTER — Non-Acute Institutional Stay (SKILLED_NURSING_FACILITY): Payer: Medicare Other | Admitting: Nurse Practitioner

## 2017-02-16 DIAGNOSIS — M5440 Lumbago with sciatica, unspecified side: Secondary | ICD-10-CM | POA: Diagnosis not present

## 2017-02-16 DIAGNOSIS — F0280 Dementia in other diseases classified elsewhere without behavioral disturbance: Secondary | ICD-10-CM | POA: Diagnosis not present

## 2017-02-16 DIAGNOSIS — K220 Achalasia of cardia: Secondary | ICD-10-CM

## 2017-02-16 DIAGNOSIS — F329 Major depressive disorder, single episode, unspecified: Secondary | ICD-10-CM

## 2017-02-16 DIAGNOSIS — G301 Alzheimer's disease with late onset: Secondary | ICD-10-CM | POA: Diagnosis not present

## 2017-02-16 DIAGNOSIS — I82402 Acute embolism and thrombosis of unspecified deep veins of left lower extremity: Secondary | ICD-10-CM

## 2017-02-16 DIAGNOSIS — F341 Dysthymic disorder: Secondary | ICD-10-CM

## 2017-02-16 DIAGNOSIS — I10 Essential (primary) hypertension: Secondary | ICD-10-CM | POA: Diagnosis not present

## 2017-02-16 NOTE — Assessment & Plan Note (Signed)
Her lower back pain is managed with Norco 10/325mg  tid and wheelchair.

## 2017-02-16 NOTE — Assessment & Plan Note (Signed)
Blood pressure is controlled, continue Metoprolol 25mg bid.  

## 2017-02-16 NOTE — Assessment & Plan Note (Signed)
She resides in SNF, transfers self, wheelchair dependent, continue Memantine 10mg  bid po.

## 2017-02-16 NOTE — Assessment & Plan Note (Signed)
She takes Rivaroxaban 15mg  daily for recurrent DVT.

## 2017-02-16 NOTE — Assessment & Plan Note (Addendum)
History of Achalasia of esophagus, s/p dilation, continue Nifedipine 10mg  tid, Nifedipine 10mg  tid,  stable.

## 2017-02-16 NOTE — Assessment & Plan Note (Signed)
depression, her mood is stable, off Sertraline about a week ago, she states she sleeps and eats well.

## 2017-02-16 NOTE — Progress Notes (Signed)
Location:  [facility] Room Number: 35 Place of Service:  SNF (31) Provider:  Kalany Diekmann, Manxie  NP  Blanchie Serve, MD  Patient Care Team: Blanchie Serve, MD as PCP - General (Internal Medicine) Annia Belt, MD as Consulting Physician (Hematology and Oncology) Suella Broad, MD as Consulting Physician (Physical Medicine and Rehabilitation) Noralee Space, MD (Pulmonary Disease) [place] Lynton Crescenzo X, NP as Nurse Practitioner (Nurse Practitioner) Inda Castle, MD (Inactive) as Consulting Physician (Gastroenterology)  Extended Emergency Contact Information Primary Emergency Contact: Sima,Gene Address: [address]          [place], [place] [address] Johnnette Litter of [place] Phone: [phone] Work Phone: [phone] Mobile Phone: [phone] Relation: Son Secondary Emergency Contact: [name], [place] of [place] Phone: [phone] Relation: Son  Code Status:  DNR Goals of care: Advanced Directive information Advanced Directives 02/16/2017  Does Patient Have a Medical Advance Directive? Yes  Type of Paramedic of [place];Living will;Out of facility DNR (pink MOST or yellow form)  Does patient want to make changes to medical advance directive? No - Patient declined  Copy of [place] in Chart? Yes  Pre-existing out of facility DNR order (yellow form or pink MOST form) Yellow form placed in chart (order not valid for inpatient use)     Chief Complaint  Patient presents with  . Medical Management of Chronic Issues    HPI:  Pt is a 83 y.o. female seen today for medical management of chronic diseases.     The patient has history of depression, her mood is stable, off Sertraline about a week ago, she states she sleeps and eats well. She resides in SNF, transfers self, wheelchair dependent, she takes Memantine 10mg  bid po. She  takes Rivaroxaban 15mg  daily for recurrent DVT. History of Achalasia of esophagus, s/p dilation, taking Nifedipine 10mg  tid, Omeprazole 20mg  qd, stable. Blood pressure is controlled on Metoprolol 25mg  bid. Her lower back pain is managed with Norco 10/325mg  tid and wheelchair.    Past Medical History:  Diagnosis Date  . Abnormal weight gain 05/09/2012  . Achalasia of esophagus 09/18/2013   09/18/13 Nifedipine 10mg  tid ac meals. 11/17/14 [facility] f/u as neeed   . Anxiety and depression 03/12/2012   Increased Sertraline to 50mg  daily 07/16/13 08/26/13 Mirtazapine 7.5mg  qhs  10/12/14 Pharm decreased Sertraline to 25mg .  05/04/15 TSH 2.89 09/15/15 pharm taper off Sertraline.  10/08/15 continue Sertraline 25mg  daily, POA desires. 12/02/15 wbc 6.1, Hgb 12.0, plt 271    . Asthmatic bronchitis   . BACK PAIN, LUMBAR 03/12/2007   Hx of lower back pain, had X-ray of her lumbar spine 09/08/12--spondylosis-better controlled with Hydrocodone/ApAp  10/325mg  ac and hs  01/01/13 lumbar spine inj. Able to ambulate with walker on unit until her fall 06/22/13. 06/25/13 unremarkable. X-ray lumbar spine. 11/10/13 dc Oxycodone prn-not used in 60 days.     . COLONIC POLYPS 03/12/2007   Initially noted April 2002 colonoscopy. Followup colonoscopy 05/31/2006 did not disclose any further polyps.    Marland Kitchen COPD (chronic obstructive pulmonary disease) ([place])   . Coronary artery disease   . Deep vein thrombosis of left lower extremity ([place]) 07/19/2012  . Dementia 09/16/2012   05/04/15 TSH 2.89   . Depression   . Diverticulosis 05/10/2012  . DJD (degenerative joint disease)   . Dry skin dermatitis 11/18/2012  . Edema of  leg 07/01/2010  . Epistaxis 01/24/2016   01/21/16: x2, held Xarelto x 1 day, Saline Nasal spray prn, wbc 9.5, Hgb 12.4, Plt 331, Na 138, K 4.5, Bun 25, creat 0.89 01/22/16 WCB76(28-31)  . Esophageal dysmotility 07/26/2013  . Esophageal dysphagia 05/27/2010   New dx of achalasia 6/ 2015 09/18/13 Nifedipine 10mg  tid ac meals.    . Esophageal  stricture   . Essential hypertension, benign 10/30/2013   12/02/15 wbc 6.1, Hgb 12.0, plt 271   . Essential thrombocythemia ([place]) 06/09/2009  . Fall 06/25/2013  . GERD (gastroesophageal reflux disease)   . Hip fracture ([place]) 09/08/2012  . HYPERCHOLESTEROLEMIA 03/12/2007   08/10/15 cholesterol 119, triglycerides 143, HDL 51, LDL 39   . Lumbar back pain   . MACULAR DEGENERATION 01/26/2008   Qualifier: Diagnosis of  By: Lenna Gilford MD, Deborra Medina   . Osteoarthritis 03/12/2007  . Osteoporosis 03/12/2007   Qualifier: Diagnosis of  By: Julien Girt CMA, Marliss Czar  06/15/14 dc Vit D 1000u po daily per pharm.    . Phlebitis and thrombophlebitis of other deep vessels of lower extremities 07/10/2010  . S/P balloon dilatation of esophageal stricture 07/28/2013   07/26/13. Dr. Henrene Pastor. EGD foreign removal and Balloon dilation of esophagus. Esophageal dysmotility, suspected achalasia, dilation at the GE junction to 8mm. Candida esophagitis.    . SYNCOPE 07/30/2009   Occurred 2011. Hospitalized. Felt due to dehydration.    . TRANSIENT ISCHEMIC ATTACKS, HX OF 03/12/2007   Qualifier: Diagnosis of  By: Julien Girt CMA, Marliss Czar     Past Surgical History:  Procedure Laterality Date  . ABDOMINAL HYSTERECTOMY     and bso  . APPENDECTOMY    . BOTOX INJECTION N/A 09/18/2013   Procedure: BOTOX INJECTION;  Surgeon: Lafayette Dragon, MD;  Location: [facility];  Service: Endoscopy;  Laterality: N/A;  . BOTOX INJECTION N/A 10/16/2014   Procedure: BOTOX INJECTION;  Surgeon: Inda Castle, MD;  Location: [facility];  Service: Endoscopy;  Laterality: N/A;  . BOTOX INJECTION N/A 05/18/2016   Procedure: BOTOX INJECTION;  Surgeon: Milus Banister, MD;  Location: [facility];  Service: Endoscopy;  Laterality: N/A;  . CATARACT EXTRACTION W/ INTRAOCULAR LENS  IMPLANT, BILATERAL    . CHOLECYSTECTOMY    . ESOPHAGEAL MANOMETRY N/A 08/25/2013   Procedure: ESOPHAGEAL MANOMETRY (EM);  Surgeon: Jerene Bears, MD;  Location: [facility];  Service: Gastroenterology;   Laterality: N/A;  . ESOPHAGOGASTRODUODENOSCOPY N/A 07/26/2013   Procedure: ESOPHAGOGASTRODUODENOSCOPY (EGD);  Surgeon: Irene Shipper, MD;  Location: [facility] ENDOSCOPY;  Service: Endoscopy;  Laterality: N/A;  . ESOPHAGOGASTRODUODENOSCOPY N/A 10/16/2014   Procedure: ESOPHAGOGASTRODUODENOSCOPY (EGD);  Surgeon: Inda Castle, MD;  Location: [facility];  Service: Endoscopy;  Laterality: N/A;  with botox  . ESOPHAGOGASTRODUODENOSCOPY (EGD) WITH ESOPHAGEAL DILATION N/A 11/25/2012   Procedure: ESOPHAGOGASTRODUODENOSCOPY (EGD) WITH ESOPHAGEAL DILATION;  Surgeon: Jerene Bears, MD;  Location: [facility];  Service: Gastroenterology;  Laterality: N/A;  . ESOPHAGOGASTRODUODENOSCOPY (EGD) WITH PROPOFOL N/A 09/18/2013   Procedure: ESOPHAGOGASTRODUODENOSCOPY (EGD) WITH PROPOFOL;  Surgeon: Lafayette Dragon, MD;  Location: [facility];  Service: Endoscopy;  Laterality: N/A;  . ESOPHAGOGASTRODUODENOSCOPY (EGD) WITH PROPOFOL N/A 05/18/2016   Procedure: ESOPHAGOGASTRODUODENOSCOPY (EGD) WITH PROPOFOL;  Surgeon: Milus Banister, MD;  Location: [facility];  Service: Endoscopy;  Laterality: N/A;  . HIP ARTHROPLASTY Right 09/09/2012   Procedure: ARTHROPLASTY BIPOLAR HIP;  Surgeon: Mauri Pole, MD;  Location: [facility];  Service: Orthopedics;  Laterality: Right;  . TAH and BSO  08/21/1995    Allergies  Allergen Reactions  . Sulfamethoxazole Rash    Outpatient Encounter Medications as of 02/16/2017  Medication Sig  . acetaminophen (TYLENOL) 325 MG tablet Take 650 mg by mouth every 4 (four) hours as needed for moderate pain.   Marland Kitchen albuterol (PROVENTIL HFA;VENTOLIN HFA) 108 (90 BASE) MCG/ACT inhaler Inhale 2 puffs into the lungs every 6 (six) hours as needed for wheezing or shortness of breath.  Marland Kitchen atorvastatin (LIPITOR) 10 MG tablet Take 10 mg by mouth daily.  . CHLORHEXIDINE GLUCONATE, BULK, SOLN Take 5 mLs by mouth daily. 5 ml swish and spit for 30 seconds every day indefinitely   . cholecalciferol (VITAMIN D) 1000 units  tablet Take 1,000 Units by mouth daily.  . feeding supplement (RESOURCE BREEZE) LIQD Take 90 mLs by mouth 2 (two) times daily.   . fluticasone furoate-vilanterol (BREO ELLIPTA) 100-25 MCG/INH AEPB Inhale 1 puff into the lungs daily. One puff inhale once a day, rinse mouth after each use.   . Humidifier MISC Please use humidifier to prevent excessive dryness.  Marland Kitchen HYDROcodone-acetaminophen (NORCO) 10-325 MG per tablet Take 1 tablet by mouth 3 (three) times daily.   . hydroxyurea (HYDREA) 500 MG capsule Take 500-1,000 mg by mouth See admin instructions. Takes 1000 mg on Monday only Takes 500 mg on Sunday, Wednesday and Friday only  . memantine (NAMENDA) 10 MG tablet Take 10 mg by mouth 2 (two) times daily.   . metoprolol tartrate (LOPRESSOR) 25 MG tablet Take 25 mg by mouth 2 (two) times daily.  . montelukast (SINGULAIR) 10 MG tablet Take 10 mg by mouth at bedtime.  . Multiple Vitamins-Minerals (PRESERVISION AREDS 2) CAPS Take 1 capsule by mouth 2 (two) times daily.  Marland Kitchen NIFEdipine (PROCARDIA) 10 MG capsule Take 10 mg by mouth 3 (three) times daily. Squeeze contents of one capsule uner tongue 15-30 minutes before meals 3 times a day   . nitroGLYCERIN (NITROSTAT) 0.4 MG SL tablet Place 0.4 mg under the tongue every 5 (five) minutes as needed for chest pain.  Marland Kitchen omeprazole (PRILOSEC) 20 MG capsule Take 20 mg by mouth at bedtime.   . Rivaroxaban (XARELTO) 15 MG TABS tablet Take 15 mg by mouth every evening. 5pm  . sodium fluoride (DENTA 5000 PLUS) 1.1 % CREA dental cream Place 1 application onto teeth at bedtime.  . [DISCONTINUED] sertraline (ZOLOFT) 25 MG tablet Take 12.5 mg by mouth every other day.    No facility-administered encounter medications on file as of 02/16/2017.    ROS was provided with assistance of staff Review of Systems  Constitutional: Negative for activity change, appetite change, fatigue and fever.  HENT: Positive for hearing loss. Negative for congestion, trouble swallowing and  voice change.   Eyes: Negative for visual disturbance.  Respiratory: Negative for cough, chest tightness and shortness of breath.   Cardiovascular: Negative for chest pain, palpitations and leg swelling.  Gastrointestinal: Negative for abdominal distention, abdominal pain, blood in stool, constipation, diarrhea, nausea and vomiting.  Endocrine: Negative for cold intolerance.  Genitourinary: Negative for difficulty urinating, dysuria, frequency and urgency.  Musculoskeletal: Positive for back pain and gait problem.  Neurological: Negative for tremors, weakness and headaches.       Dementia  Psychiatric/Behavioral: Positive for confusion. Negative for agitation, behavioral problems, hallucinations and sleep disturbance. The patient is not nervous/anxious.     Immunization History  Administered Date(s) Administered  . Influenza Split 11/16/2010, 12/13/2011  . Influenza Whole 11/27/2009  . Influenza-Unspecified 12/31/2013, 11/18/2014, 12/14/2015, 12/11/2016  . PPD Test 09/11/2012  .  Pneumococcal Polysaccharide-23 02/28/1988   Pertinent  Health Maintenance Due  Topic Date Due  . PNA vac Low Risk Adult (2 of 2 - PCV13) 02/27/2017 (Originally 02/27/1989)  . INFLUENZA VACCINE  Completed  . DEXA SCAN  Completed   Fall Risk  02/08/2017 02/08/2017 10/04/2015 12/28/2014 10/23/2014  Falls in the past year? No Exclusion - non ambulatory No Yes Yes  Number falls in past yr: - - - 1 1  Injury with Fall? - - - No No  Risk for fall due to : - Impaired balance/gait;Impaired vision Impaired mobility;Impaired balance/gait History of fall(s);Impaired balance/gait;Impaired mobility Impaired mobility  Follow up - - - Falls prevention discussed Falls evaluation completed   Functional Status Survey:    Vitals:   02/16/17 1013  BP: 132/72  Pulse: 70  Temp: 98 F (36.7 C)  Weight: 100 lb 3.2 oz (45.5 kg)  Height: 5\' 1"  (1.549 m)   Body mass index is 18.93 kg/m. Physical Exam  Constitutional: She  appears well-developed and well-nourished.  HENT:  Head: Normocephalic and atraumatic.  Eyes: Conjunctivae and EOM are normal. Pupils are equal, round, and reactive to light.  Neck: Normal range of motion. Neck supple. No JVD present. No thyromegaly present.  Cardiovascular: Normal rate and normal heart sounds.  No murmur heard. Pulmonary/Chest: Effort normal and breath sounds normal. She has no wheezes. She has no rales.  Abdominal: Soft. Bowel sounds are normal. She exhibits no distension. There is no tenderness. There is no rebound.  Musculoskeletal: Normal range of motion. She exhibits tenderness. She exhibits no edema.  Chronic lower back pain, transfers self, wheelchair dependent.   Neurological: She is alert. She exhibits normal muscle tone. Coordination normal.  Oriented to person and place  Skin: Skin is warm and dry. No rash noted. No erythema.  Psychiatric: She has a normal mood and affect. Her behavior is normal. Judgment and thought content normal.    Labs reviewed: Recent Labs    03/09/16 06/13/16  NA 143 139  K 3.9 4.2  BUN 17 17  CREATININE 0.7 0.7   Recent Labs    03/09/16 06/13/16  AST 17 14  ALT 10 9  ALKPHOS 68 63   Recent Labs    09/05/16 11/07/16 01/02/17  WBC 6.3 5.6 6.2  6.2  HGB 10.9* 11.6* 12.7  12.7  HCT 33* 34* 36  36  PLT 305 279 272   Lab Results  Component Value Date   TSH 2.00 09/21/2016   Lab Results  Component Value Date   HGBA1C 5.5 10/16/2014   Lab Results  Component Value Date   CHOL 118 08/10/2016   HDL 46 08/10/2016   LDLCALC 51 08/10/2016   LDLDIRECT 152.3 06/04/2009   TRIG 128 08/10/2016   CHOLHDL 3 09/13/2011    Significant Diagnostic Results in last 30 days:  No results found.  Assessment/Plan Major depression, chronic depression, her mood is stable, off Sertraline about a week ago, she states she sleeps and eats well.  Alzheimer's dementia without behavioral disturbance She resides in SNF, transfers self,  wheelchair dependent, continue Memantine 10mg  bid po.    Deep vein thrombosis of left lower extremity  She takes Rivaroxaban 15mg  daily for recurrent DVT.   Achalasia of esophagus History of Achalasia of esophagus, s/p dilation, continue Nifedipine 10mg  tid, Nifedipine 10mg  tid,  stable.    Essential hypertension, benign Blood pressure is controlled, continue Metoprolol 25mg  bid.   BACK PAIN, LUMBAR Her lower back pain is managed with  Norco 10/325mg  tid and wheelchair.    Family/ staff Communication: plan of care reviewed with the patient and charge nurse  Labs/tests ordered: none  Time spend 25 minutes

## 2017-03-01 ENCOUNTER — Other Ambulatory Visit: Payer: Self-pay | Admitting: *Deleted

## 2017-03-01 DIAGNOSIS — D473 Essential (hemorrhagic) thrombocythemia: Secondary | ICD-10-CM | POA: Diagnosis not present

## 2017-03-01 LAB — LIPID PANEL
Cholesterol: 137 (ref 0–200)
HDL: 46 (ref 35–70)
LDL CALC: 70
LDl/HDL Ratio: 3
TRIGLYCERIDES: 133 (ref 40–160)

## 2017-03-22 ENCOUNTER — Encounter: Payer: Self-pay | Admitting: Internal Medicine

## 2017-03-22 ENCOUNTER — Non-Acute Institutional Stay (SKILLED_NURSING_FACILITY): Payer: Medicare Other | Admitting: Internal Medicine

## 2017-03-22 DIAGNOSIS — G8929 Other chronic pain: Secondary | ICD-10-CM

## 2017-03-22 DIAGNOSIS — M5440 Lumbago with sciatica, unspecified side: Secondary | ICD-10-CM

## 2017-03-22 DIAGNOSIS — J449 Chronic obstructive pulmonary disease, unspecified: Secondary | ICD-10-CM | POA: Diagnosis not present

## 2017-03-22 DIAGNOSIS — I119 Hypertensive heart disease without heart failure: Secondary | ICD-10-CM | POA: Insufficient documentation

## 2017-03-22 NOTE — Progress Notes (Signed)
Location:  [facility] Room Number: 35 Place of Service:  SNF [phone]) Provider:  Blanchie Serve MD  Blanchie Serve, MD  Patient Care Team: Blanchie Serve, MD as PCP - General (Internal Medicine) Annia Belt, MD as Consulting Physician (Hematology and Oncology) Suella Broad, MD as Consulting Physician (Physical Medicine and Rehabilitation) Noralee Space, MD (Pulmonary Disease) [name], [name], NP as Nurse Practitioner (Nurse Practitioner) Inda Castle, MD (Inactive) as Consulting Physician (Gastroenterology)  Extended Emergency Contact Information Primary Emergency Contact: Roehrich,Gene Address: [address]          [place], [place] [address] Johnnette Litter of [place] Phone: [phone] Work Phone: [phone] Mobile Phone: [phone] Relation: Son Secondary Emergency Contact: [name],  [place] of [place] Phone: [phone] Relation: Son  Code Status:  DNR  Goals of care: Advanced Directive information Advanced Directives 03/22/2017  Does Patient Have a Medical Advance Directive? Yes  Type of Paramedic of [place];Living will;Out of facility DNR (pink MOST or yellow form)  Does patient want to make changes to medical advance directive? No - Patient declined  Copy of [place] in Chart? Yes  Pre-existing out of facility DNR order (yellow form or pink MOST form) Yellow form placed in chart (order not valid for inpatient use)     Chief Complaint  Patient presents with  . Medical Management of Chronic Issues    Routine Visit     HPI:  Pt is a 84 y.o. female seen today for medical management of chronic diseases.  She has been at her baseline per nursing. No health concern from patient and nursing. Weight is low but stable. Takes nutritional supplement. Denies any arthritis pain this visit. Controlled BP reading on chart  review. Taking lopressor and nifedipine. Reflux symptom is controlled. Participates in group activities at the facility. Remains pleasantly confused but no acute behavioral changes reported.    Past Medical History:  Diagnosis Date  . Abnormal weight gain 05/09/2012  . Achalasia of esophagus 09/18/2013   09/18/13 Nifedipine 10mg  tid ac meals. 11/17/14 [facility] f/u as neeed   . Anxiety and depression 03/12/2012   Increased Sertraline to 50mg  daily 07/16/13 08/26/13 Mirtazapine 7.5mg  qhs  10/12/14 Pharm decreased Sertraline to 25mg .  05/04/15 TSH 2.89 09/15/15 pharm taper off Sertraline.  10/08/15 continue Sertraline 25mg  daily, POA desires. 12/02/15 wbc 6.1, Hgb 12.0, plt 271    . Asthmatic bronchitis   . BACK PAIN, LUMBAR 03/12/2007   Hx of lower back pain, had X-ray of her lumbar spine 09/08/12--spondylosis-better controlled with Hydrocodone/ApAp  10/325mg  ac and hs  01/01/13 lumbar spine inj. Able to ambulate with walker on unit until her fall 06/22/13. 06/25/13 unremarkable. X-ray lumbar spine. 11/10/13 dc Oxycodone prn-not used in 60 days.     . COLONIC POLYPS 03/12/2007   Initially noted April 2002 colonoscopy. Followup colonoscopy 05/31/2006 did not disclose any further polyps.    Marland Kitchen COPD (chronic obstructive pulmonary disease) ([place])   . Coronary artery disease   . Deep vein thrombosis of left lower extremity ([place]) 07/19/2012  . Dementia 09/16/2012   05/04/15 TSH 2.89   . Depression   . Diverticulosis 05/10/2012  . DJD (degenerative joint disease)   . Dry skin dermatitis 11/18/2012  . Edema of leg 07/01/2010  . Epistaxis 01/24/2016   01/21/16: x2, held Xarelto x 1 day, Saline Nasal spray  prn, wbc 9.5, Hgb 12.4, Plt 331, Na 138, K 4.5, Bun 25, creat 0.89 01/22/16 DTO67(12-45)  . Esophageal dysmotility 07/26/2013  . Esophageal dysphagia 05/27/2010   New dx of achalasia 6/ 2015 09/18/13 Nifedipine 10mg  tid ac meals.    . Esophageal stricture   . Essential hypertension, benign 10/30/2013   12/02/15 wbc 6.1, Hgb 12.0,  plt 271   . Essential thrombocythemia ([place]) 06/09/2009  . Fall 06/25/2013  . GERD (gastroesophageal reflux disease)   . Hip fracture ([place]) 09/08/2012  . HYPERCHOLESTEROLEMIA 03/12/2007   08/10/15 cholesterol 119, triglycerides 143, HDL 51, LDL 39   . Lumbar back pain   . MACULAR DEGENERATION 01/26/2008   Qualifier: Diagnosis of  By: Lenna Gilford MD, Deborra Medina   . Osteoarthritis 03/12/2007  . Osteoporosis 03/12/2007   Qualifier: Diagnosis of  By: Julien Girt CMA, Marliss Czar  06/15/14 dc Vit D 1000u po daily per pharm.    . Phlebitis and thrombophlebitis of other deep vessels of lower extremities 07/10/2010  . S/P balloon dilatation of esophageal stricture 07/28/2013   07/26/13. Dr. Henrene Pastor. EGD foreign removal and Balloon dilation of esophagus. Esophageal dysmotility, suspected achalasia, dilation at the GE junction to 21mm. Candida esophagitis.    . SYNCOPE 07/30/2009   Occurred 2011. Hospitalized. Felt due to dehydration.    . TRANSIENT ISCHEMIC ATTACKS, HX OF 03/12/2007   Qualifier: Diagnosis of  By: Julien Girt CMA, Marliss Czar     Past Surgical History:  Procedure Laterality Date  . ABDOMINAL HYSTERECTOMY     and bso  . APPENDECTOMY    . BOTOX INJECTION N/A 09/18/2013   Procedure: BOTOX INJECTION;  Surgeon: Lafayette Dragon, MD;  Location: [facility];  Service: Endoscopy;  Laterality: N/A;  . BOTOX INJECTION N/A 10/16/2014   Procedure: BOTOX INJECTION;  Surgeon: Inda Castle, MD;  Location: [facility];  Service: Endoscopy;  Laterality: N/A;  . BOTOX INJECTION N/A 05/18/2016   Procedure: BOTOX INJECTION;  Surgeon: Milus Banister, MD;  Location: [facility];  Service: Endoscopy;  Laterality: N/A;  . CATARACT EXTRACTION W/ INTRAOCULAR LENS  IMPLANT, BILATERAL    . CHOLECYSTECTOMY    . ESOPHAGEAL MANOMETRY N/A 08/25/2013   Procedure: ESOPHAGEAL MANOMETRY (EM);  Surgeon: Jerene Bears, MD;  Location: [facility];  Service: Gastroenterology;  Laterality: N/A;  . ESOPHAGOGASTRODUODENOSCOPY N/A 07/26/2013   Procedure:  ESOPHAGOGASTRODUODENOSCOPY (EGD);  Surgeon: Irene Shipper, MD;  [facility];  Service: Endoscopy;  Laterality: N/A;  . ESOPHAGOGASTRODUODENOSCOPY N/A 10/16/2014   Procedure: ESOPHAGOGASTRODUODENOSCOPY (EGD);  Surgeon: Inda Castle, MD;  Location: [facility];  Service: Endoscopy;  Laterality: N/A;  with botox  . ESOPHAGOGASTRODUODENOSCOPY (EGD) WITH ESOPHAGEAL DILATION N/A 11/25/2012   Procedure: ESOPHAGOGASTRODUODENOSCOPY (EGD) WITH ESOPHAGEAL DILATION;  Surgeon: Jerene Bears, MD;  Location: [facility];  Service: Gastroenterology;  Laterality: N/A;  . ESOPHAGOGASTRODUODENOSCOPY (EGD) WITH PROPOFOL N/A 09/18/2013   Procedure: ESOPHAGOGASTRODUODENOSCOPY (EGD) WITH PROPOFOL;  Surgeon: Lafayette Dragon, MD;  Location: [facility];  Service: Endoscopy;  Laterality: N/A;  . ESOPHAGOGASTRODUODENOSCOPY (EGD) WITH PROPOFOL N/A 05/18/2016   Procedure: ESOPHAGOGASTRODUODENOSCOPY (EGD) WITH PROPOFOL;  Surgeon: Milus Banister, MD;  Location: [facility];  Service: Endoscopy;  Laterality: N/A;  . HIP ARTHROPLASTY Right 09/09/2012   Procedure: ARTHROPLASTY BIPOLAR HIP;  Surgeon: Mauri Pole, MD;  Location: [facility];  Service: Orthopedics;  Laterality: Right;  . TAH and BSO  08/21/1995    Allergies  Allergen Reactions  . Sulfamethoxazole Rash    Outpatient Encounter Medications as of 03/22/2017  Medication  Sig  . acetaminophen (TYLENOL) 325 MG tablet Take 650 mg by mouth every 4 (four) hours as needed for moderate pain.   Marland Kitchen albuterol (PROVENTIL HFA;VENTOLIN HFA) 108 (90 BASE) MCG/ACT inhaler Inhale 2 puffs into the lungs every 6 (six) hours as needed for wheezing or shortness of breath.  Marland Kitchen atorvastatin (LIPITOR) 10 MG tablet Take 10 mg by mouth daily.  . CHLORHEXIDINE GLUCONATE, BULK, SOLN Take 5 mLs by mouth daily. 5 ml swish and spit for 30 seconds every day indefinitely   . cholecalciferol (VITAMIN D) 1000 units tablet Take 1,000 Units by mouth daily.  . feeding supplement (RESOURCE BREEZE)  LIQD Take 90 mLs by mouth 2 (two) times daily.   . fluticasone furoate-vilanterol (BREO ELLIPTA) 100-25 MCG/INH AEPB Inhale 1 puff into the lungs daily. One puff inhale once a day, rinse mouth after each use.   . Humidifier MISC Please use humidifier to prevent excessive dryness.  Marland Kitchen HYDROcodone-acetaminophen (NORCO) 10-325 MG per tablet Take 1 tablet by mouth 3 (three) times daily.   . hydroxyurea (HYDREA) 500 MG capsule Take 500-1,000 mg by mouth See admin instructions. Takes 1000 mg on Monday only Takes 500 mg on Sunday, Wednesday and Friday only  . memantine (NAMENDA) 10 MG tablet Take 10 mg by mouth 2 (two) times daily.   . metoprolol tartrate (LOPRESSOR) 25 MG tablet Take 25 mg by mouth 2 (two) times daily.  . montelukast (SINGULAIR) 10 MG tablet Take 10 mg by mouth at bedtime.  . Multiple Vitamins-Minerals (PRESERVISION AREDS 2) CAPS Take 1 capsule by mouth 2 (two) times daily.  Marland Kitchen NIFEdipine (PROCARDIA) 10 MG capsule Take 10 mg by mouth 3 (three) times daily. Squeeze contents of one capsule uner tongue 15-30 minutes before meals 3 times a day   . nitroGLYCERIN (NITROSTAT) 0.4 MG SL tablet Place 0.4 mg under the tongue every 5 (five) minutes as needed for chest pain.  Marland Kitchen omeprazole (PRILOSEC) 20 MG capsule Take 20 mg by mouth at bedtime.   . Rivaroxaban (XARELTO) 15 MG TABS tablet Take 15 mg by mouth every evening. 5pm  . sodium fluoride (DENTA 5000 PLUS) 1.1 % CREA dental cream Place 1 application onto teeth at bedtime.   No facility-administered encounter medications on file as of 03/22/2017.     Review of Systems  Constitutional: Negative for appetite change, chills and fever.  HENT: Negative for congestion, mouth sores, nosebleeds, rhinorrhea and trouble swallowing.   Eyes: Positive for visual disturbance.  Respiratory: Negative for cough, shortness of breath and wheezing.   Cardiovascular: Positive for leg swelling. Negative for chest pain and palpitations.  Gastrointestinal:  Negative for abdominal pain, constipation, nausea and vomiting.  Genitourinary: Negative for dysuria, frequency and hematuria.  Musculoskeletal: Positive for arthralgias and gait problem. Negative for back pain.       Controlled pain, denies pain this visit, wheelchair for ambulation, needs assistance with ADLs like transfer, bathing and dressing  Skin: Negative for rash.  Neurological: Negative for dizziness and headaches.  Hematological: Bruises/bleeds easily.    Immunization History  Administered Date(s) Administered  . Influenza Split 11/16/2010, 12/13/2011  . Influenza Whole 11/27/2009  . Influenza-Unspecified 12/31/2013, 11/18/2014, 12/14/2015, 12/11/2016  . PPD Test 09/11/2012  . Pneumococcal Polysaccharide-23 02/28/1988   Pertinent  Health Maintenance Due  Topic Date Due  . PNA vac Low Risk Adult (2 of 2 - PCV13) 02/27/1989  . INFLUENZA VACCINE  Completed  . DEXA SCAN  Completed   Fall Risk  02/08/2017 02/08/2017 10/04/2015 12/28/2014  10/23/2014  Falls in the past year? No Exclusion - non ambulatory No Yes Yes  Number falls in past yr: - - - 1 1  Injury with Fall? - - - No No  Risk for fall due to : - Impaired balance/gait;Impaired vision Impaired mobility;Impaired balance/gait History of fall(s);Impaired balance/gait;Impaired mobility Impaired mobility  Follow up - - - Falls prevention discussed Falls evaluation completed   Functional Status Survey:    Vitals:   03/22/17 1200  BP: 128/66  Pulse: 76  Resp: 16  Temp: 98.6 F (37 C)  TempSrc: Oral  Weight: 100 lb (45.4 kg)  Height: 5\' 1"  (1.549 m)   Body mass index is 18.89 kg/m.   Wt Readings from Last 3 Encounters:  03/22/17 100 lb (45.4 kg)  02/16/17 100 lb 3.2 oz (45.5 kg)  02/08/17 100 lb 3.2 oz (45.5 kg)   Physical Exam  Constitutional:  Thin built and frail, in no distress  HENT:  Head: Normocephalic and atraumatic.  Mouth/Throat: Oropharynx is clear and moist. No oropharyngeal exudate.  Hearing aid  present  Eyes: Conjunctivae and EOM are normal. Pupils are equal, round, and reactive to light. Right eye exhibits no discharge. Left eye exhibits no discharge.  Wears corrective glasses  Neck: Neck supple.  Cardiovascular: Normal rate and regular rhythm.  Pulmonary/Chest: Effort normal and breath sounds normal.  Abdominal: Soft. Bowel sounds are normal. There is no tenderness. There is no guarding.  Musculoskeletal: She exhibits edema and deformity. She exhibits no tenderness.  Can move all 4 extremities, leg weakness, uses wheelchair for ambulation, unsteady gait  Lymphadenopathy:    She has no cervical adenopathy.  Neurological: She is alert.  Pleasantly confused, oriented to person and place only  Skin: Skin is warm and dry. No rash noted. She is not diaphoretic.  Psychiatric: She has a normal mood and affect.    Labs reviewed: Recent Labs    06/13/16 01/30/17  NA 139 139  K 4.2 4.3  BUN 17 19  CREATININE 0.7 0.8   Recent Labs    06/13/16 01/30/17  AST 14 17  ALT 9 11  ALKPHOS 63 68   Recent Labs    09/05/16 11/07/16 01/02/17  WBC 6.3 5.6 6.2  6.2  HGB 10.9* 11.6* 12.7  12.7  HCT 33* 34* 36  36  PLT 305 279 272   Lab Results  Component Value Date   TSH 2.00 09/21/2016   Lab Results  Component Value Date   HGBA1C 5.5 10/16/2014   Lab Results  Component Value Date   CHOL 137 03/01/2017   HDL 46 03/01/2017   LDLCALC 70 03/01/2017   LDLDIRECT 152.3 06/04/2009   TRIG 133 03/01/2017   CHOLHDL 3 09/13/2011    Significant Diagnostic Results in last 30 days:  No results found.  Assessment/Plan  1. Hypertensive heart disease, unspecified whether heart failure present Controlled BP on chart review. Continue lopressor 25 mg bid and nifedipine 10 mg tid. No hypotensive reading noted. Reviewed renal function and lipid panel. Continue stain.   2. COPD with asthma ([place]) No recent exacerbation. Breathing currently stable. Continue monteleukast with fluticasone-  vilanterol daily. Continue prn albuterol. Decrease nasal saline spray to bid from tid. Continue humidifier.  3. Chronic bilateral low back pain with sciatica, sciatica laterality unspecified Chronic low back pain along with joint pain from OA and history of hip fracture. Contorlled. On chart review, has not asked or required prn tylenol. Currently on norco 10-325 mg tid with  prn tylenol. Decrease norco to 10-325 mg bid for now. Continue prn tylenol and monitor     Family/ staff Communication: reviewed care plan with patient and charge nurse.    Labs/tests ordered:  none   Blanchie Serve, MD Internal Medicine [facility] [address] [place], [place] [address] Cell Phone (Monday-Friday 8 am - 5 pm): [phone] On Call: [phone] and follow prompts after 5 pm and on weekends Office Phone: [phone] Office Fax: [phone]

## 2017-03-27 ENCOUNTER — Non-Acute Institutional Stay (SKILLED_NURSING_FACILITY): Payer: Medicare Other | Admitting: Nurse Practitioner

## 2017-03-27 ENCOUNTER — Encounter: Payer: Self-pay | Admitting: Nurse Practitioner

## 2017-03-27 DIAGNOSIS — M150 Primary generalized (osteo)arthritis: Secondary | ICD-10-CM

## 2017-03-27 DIAGNOSIS — M159 Polyosteoarthritis, unspecified: Secondary | ICD-10-CM

## 2017-03-27 DIAGNOSIS — H04129 Dry eye syndrome of unspecified lacrimal gland: Secondary | ICD-10-CM | POA: Diagnosis not present

## 2017-03-27 DIAGNOSIS — J449 Chronic obstructive pulmonary disease, unspecified: Secondary | ICD-10-CM | POA: Diagnosis not present

## 2017-03-27 DIAGNOSIS — J4489 Other specified chronic obstructive pulmonary disease: Secondary | ICD-10-CM

## 2017-03-27 DIAGNOSIS — M8949 Other hypertrophic osteoarthropathy, multiple sites: Secondary | ICD-10-CM

## 2017-03-27 NOTE — Progress Notes (Signed)
Location:  [facility] Room Number: 35 Place of Service:  SNF (31) Provider:  [name], Manxie  NP  Blanchie Serve, MD  Patient Care Team: Blanchie Serve, MD as PCP - General (Internal Medicine) Annia Belt, MD as Consulting Physician (Hematology and Oncology) Suella Broad, MD as Consulting Physician (Physical Medicine and Rehabilitation) Noralee Space, MD (Pulmonary Disease) [place] [name], [name], NP as Nurse Practitioner (Nurse Practitioner) Inda Castle, MD (Inactive) as Consulting Physician (Gastroenterology)  Extended Emergency Contact Information Primary Emergency Contact: Bently,Gene Address: [address]          [place], [place] [address] Johnnette Litter of [place] Phone: [phone] Work Phone: [phone] Mobile Phone: [phone] Relation: Son Secondary Emergency Contact: [name], Elcho [place] of [place] Phone: [phone] Relation: Son  Code Status:  DNR Goals of care: Advanced Directive information Advanced Directives 03/22/2017  Does Patient Have a Medical Advance Directive? Yes  Type of Paramedic of [place];Living will;Out of facility DNR (pink MOST or yellow form)  Does patient want to make changes to medical advance directive? No - Patient declined  Copy of [place] in Chart? Yes  Pre-existing out of facility DNR order (yellow form or pink MOST form) Yellow form placed in chart (order not valid for inpatient use)     Chief Complaint  Patient presents with  . Acute Visit    Eyes red, puffy, itchy    HPI:  Pt is a 84 y.o. female seen today for an acute visit for complaining dry irritable mild injected eyes, uncertain duration, no drainage, denied change of vision. Hx of macular degeneration. History of COPD with asthma, stable on Singulair 10mg  qd, Breo Ellipta 100/25mg  I puff daily, Albuterol HFA prn.     Past Medical History:  Diagnosis Date  . Abnormal weight gain 05/09/2012  . Achalasia of esophagus 09/18/2013   09/18/13 Nifedipine 10mg  tid ac meals. 11/17/14 [facility] f/u as neeed   . Anxiety and depression 03/12/2012   Increased Sertraline to 50mg  daily 07/16/13 08/26/13 Mirtazapine 7.5mg  qhs  10/12/14 Pharm decreased Sertraline to 25mg .  05/04/15 TSH 2.89 09/15/15 pharm taper off Sertraline.  10/08/15 continue Sertraline 25mg  daily, POA desires. 12/02/15 wbc 6.1, Hgb 12.0, plt 271    . Asthmatic bronchitis   . BACK PAIN, LUMBAR 03/12/2007   Hx of lower back pain, had X-ray of her lumbar spine 09/08/12--spondylosis-better controlled with Hydrocodone/ApAp  10/325mg  ac and hs  01/01/13 lumbar spine inj. Able to ambulate with walker on unit until her fall 06/22/13. 06/25/13 unremarkable. X-ray lumbar spine. 11/10/13 dc Oxycodone prn-not used in 60 days.     . COLONIC POLYPS 03/12/2007   Initially noted April 2002 colonoscopy. Followup colonoscopy 05/31/2006 did not disclose any further polyps.    Marland Kitchen COPD (chronic obstructive pulmonary disease) ([place])   . Coronary artery disease   . Deep vein thrombosis of left lower extremity ([place]) 07/19/2012  . Dementia 09/16/2012   05/04/15 TSH 2.89   . Depression   . Diverticulosis 05/10/2012  . DJD (degenerative joint disease)   . Dry skin dermatitis 11/18/2012  . Edema of leg 07/01/2010  . Epistaxis 01/24/2016   01/21/16: x2, held Xarelto x 1 day, Saline Nasal spray prn, wbc 9.5, Hgb 12.4, Plt 331, Na 138, K 4.5, Bun 25, creat 0.89 01/22/16 FUX32(35-57)  . Esophageal dysmotility 07/26/2013  . Esophageal dysphagia  05/27/2010   New dx of achalasia 6/ 2015 09/18/13 Nifedipine 10mg  tid ac meals.    . Esophageal stricture   . Essential hypertension, benign 10/30/2013   12/02/15 wbc 6.1, Hgb 12.0, plt 271   . Essential thrombocythemia ([place]) 06/09/2009  . Fall 06/25/2013  . GERD (gastroesophageal reflux disease)   . Hip fracture ([place]) 09/08/2012  . HYPERCHOLESTEROLEMIA 03/12/2007    08/10/15 cholesterol 119, triglycerides 143, HDL 51, LDL 39   . Lumbar back pain   . MACULAR DEGENERATION 01/26/2008   Qualifier: Diagnosis of  By: Lenna Gilford MD, Deborra Medina   . Osteoarthritis 03/12/2007  . Osteoporosis 03/12/2007   Qualifier: Diagnosis of  By: Julien Girt CMA, Marliss Czar  06/15/14 dc Vit D 1000u po daily per pharm.    . Phlebitis and thrombophlebitis of other deep vessels of lower extremities 07/10/2010  . S/P balloon dilatation of esophageal stricture 07/28/2013   07/26/13. Dr. Henrene Pastor. EGD foreign removal and Balloon dilation of esophagus. Esophageal dysmotility, suspected achalasia, dilation at the GE junction to 65mm. Candida esophagitis.    . SYNCOPE 07/30/2009   Occurred 2011. Hospitalized. Felt due to dehydration.    . TRANSIENT ISCHEMIC ATTACKS, HX OF 03/12/2007   Qualifier: Diagnosis of  By: Julien Girt CMA, Marliss Czar     Past Surgical History:  Procedure Laterality Date  . ABDOMINAL HYSTERECTOMY     and bso  . APPENDECTOMY    . BOTOX INJECTION N/A 09/18/2013   Procedure: BOTOX INJECTION;  Surgeon: Lafayette Dragon, MD;  Location: [facility];  Service: Endoscopy;  Laterality: N/A;  . BOTOX INJECTION N/A 10/16/2014   Procedure: BOTOX INJECTION;  Surgeon: Inda Castle, MD;  Location: [facility];  Service: Endoscopy;  Laterality: N/A;  . BOTOX INJECTION N/A 05/18/2016   Procedure: BOTOX INJECTION;  Surgeon: Milus Banister, MD;  Location: [facility];  Service: Endoscopy;  Laterality: N/A;  . CATARACT EXTRACTION W/ INTRAOCULAR LENS  IMPLANT, BILATERAL    . CHOLECYSTECTOMY    . ESOPHAGEAL MANOMETRY N/A 08/25/2013   Procedure: ESOPHAGEAL MANOMETRY (EM);  Surgeon: Jerene Bears, MD;  Location: [facility];  Service: Gastroenterology;  Laterality: N/A;  . ESOPHAGOGASTRODUODENOSCOPY N/A 07/26/2013   Procedure: ESOPHAGOGASTRODUODENOSCOPY (EGD);  Surgeon: Irene Shipper, MD;  Location[facility] ENDOSCOPY;  Service: Endoscopy;  Laterality: N/A;  . ESOPHAGOGASTRODUODENOSCOPY N/A 10/16/2014   Procedure:  ESOPHAGOGASTRODUODENOSCOPY (EGD);  Surgeon: Inda Castle, MD;  Location: [facility];  Service: Endoscopy;  Laterality: N/A;  with botox  . ESOPHAGOGASTRODUODENOSCOPY (EGD) WITH ESOPHAGEAL DILATION N/A 11/25/2012   Procedure: ESOPHAGOGASTRODUODENOSCOPY (EGD) WITH ESOPHAGEAL DILATION;  Surgeon: Jerene Bears, MD;  [facility];  Service: Gastroenterology;  Laterality: N/A;  . ESOPHAGOGASTRODUODENOSCOPY (EGD) WITH PROPOFOL N/A 09/18/2013   Procedure: ESOPHAGOGASTRODUODENOSCOPY (EGD) WITH PROPOFOL;  Surgeon: Lafayette Dragon, MD;  Location: [facility];  Service: Endoscopy;  Laterality: N/A;  . ESOPHAGOGASTRODUODENOSCOPY (EGD) WITH PROPOFOL N/A 05/18/2016   Procedure: ESOPHAGOGASTRODUODENOSCOPY (EGD) WITH PROPOFOL;  Surgeon: Milus Banister, MD;  Location: [facility];  Service: Endoscopy;  Laterality: N/A;  . HIP ARTHROPLASTY Right 09/09/2012   Procedure: ARTHROPLASTY BIPOLAR HIP;  Surgeon: Mauri Pole, MD;  Location: [facility];  Service: Orthopedics;  Laterality: Right;  . TAH and BSO  08/21/1995    Allergies  Allergen Reactions  . Sulfamethoxazole Rash    Outpatient Encounter Medications as of 03/27/2017  Medication Sig  . acetaminophen (TYLENOL) 325 MG tablet Take 650 mg by mouth every 4 (four) hours as needed for moderate pain.   Marland Kitchen albuterol (  PROVENTIL HFA;VENTOLIN HFA) 108 (90 BASE) MCG/ACT inhaler Inhale 2 puffs into the lungs every 6 (six) hours as needed for wheezing or shortness of breath.  Marland Kitchen atorvastatin (LIPITOR) 10 MG tablet Take 10 mg by mouth daily.  . CHLORHEXIDINE GLUCONATE, BULK, SOLN Take 5 mLs by mouth daily. 5 ml swish and spit for 30 seconds every day indefinitely   . cholecalciferol (VITAMIN D) 1000 units tablet Take 1,000 Units by mouth daily.  . feeding supplement (RESOURCE BREEZE) LIQD Take 90 mLs by mouth 2 (two) times daily.   . fluticasone furoate-vilanterol (BREO ELLIPTA) 100-25 MCG/INH AEPB Inhale 1 puff into the lungs daily. One puff inhale once a day,  rinse mouth after each use.   . Humidifier MISC Please use humidifier to prevent excessive dryness.  Marland Kitchen HYDROcodone-acetaminophen (NORCO) 10-325 MG per tablet Take 1 tablet by mouth 3 (three) times daily.   . hydroxyurea (HYDREA) 500 MG capsule Take 500-1,000 mg by mouth See admin instructions. Takes 1000 mg on Monday only Takes 500 mg on Sunday, Wednesday and Friday only  . memantine (NAMENDA) 10 MG tablet Take 10 mg by mouth 2 (two) times daily.   . metoprolol tartrate (LOPRESSOR) 25 MG tablet Take 25 mg by mouth 2 (two) times daily.  . montelukast (SINGULAIR) 10 MG tablet Take 10 mg by mouth at bedtime.  . Multiple Vitamins-Minerals (PRESERVISION AREDS 2) CAPS Take 1 capsule by mouth 2 (two) times daily.  Marland Kitchen NIFEdipine (PROCARDIA) 10 MG capsule Take 10 mg by mouth 3 (three) times daily. Squeeze contents of one capsule uner tongue 15-30 minutes before meals 3 times a day   . nitroGLYCERIN (NITROSTAT) 0.4 MG SL tablet Place 0.4 mg under the tongue every 5 (five) minutes as needed for chest pain.  Marland Kitchen omeprazole (PRILOSEC) 20 MG capsule Take 20 mg by mouth at bedtime.   . Rivaroxaban (XARELTO) 15 MG TABS tablet Take 15 mg by mouth every evening. 5pm  . sodium fluoride (DENTA 5000 PLUS) 1.1 % CREA dental cream Place 1 application onto teeth at bedtime.   No facility-administered encounter medications on file as of 03/27/2017.    ROS was provided with assistance of staff Review of Systems  Constitutional: Negative for activity change, appetite change, chills, diaphoresis, fatigue and fever.  HENT: Positive for hearing loss. Negative for congestion, ear pain, mouth sores, nosebleeds, rhinorrhea, sinus pain, trouble swallowing and voice change.   Eyes: Positive for redness and itching. Negative for photophobia, pain, discharge and visual disturbance.  Respiratory: Negative for cough, choking, chest tightness, shortness of breath and wheezing.     Immunization History  Administered Date(s)  Administered  . Influenza Split 11/16/2010, 12/13/2011  . Influenza Whole 11/27/2009  . Influenza-Unspecified 12/31/2013, 11/18/2014, 12/14/2015, 12/11/2016  . PPD Test 09/11/2012  . Pneumococcal Polysaccharide-23 02/28/1988   Pertinent  Health Maintenance Due  Topic Date Due  . PNA vac Low Risk Adult (2 of 2 - PCV13) 02/27/1989  . INFLUENZA VACCINE  Completed  . DEXA SCAN  Completed   Fall Risk  02/08/2017 02/08/2017 10/04/2015 12/28/2014 10/23/2014  Falls in the past year? No Exclusion - non ambulatory No Yes Yes  Number falls in past yr: - - - 1 1  Injury with Fall? - - - No No  Risk for fall due to : - Impaired balance/gait;Impaired vision Impaired mobility;Impaired balance/gait History of fall(s);Impaired balance/gait;Impaired mobility Impaired mobility  Follow up - - - Falls prevention discussed Falls evaluation completed   Functional Status Survey:  Vitals:   03/27/17 1152  BP: 120/60  Pulse: 62  Resp: 18  Temp: 98.7 F (37.1 C)  SpO2: 95%  Weight: 100 lb (45.4 kg)  Height: 5\' 1"  (1.549 m)   Body mass index is 18.89 kg/m. Physical Exam  Constitutional: She appears well-developed and well-nourished. No distress.  HENT:  Head: Normocephalic and atraumatic.  Mouth/Throat: Oropharynx is clear and moist. No oropharyngeal exudate.  Eyes: EOM are normal. Pupils are equal, round, and reactive to light. Right eye exhibits no discharge. Left eye exhibits no discharge. No scleral icterus.  Mild erythema in R+L conjunctivae.   Neck: Normal range of motion. Neck supple. No JVD present. No thyromegaly present.  Pulmonary/Chest: Effort normal. She has no wheezes. She has no rales.  Decreased air entry   Skin: She is not diaphoretic.    Labs reviewed: Recent Labs    06/13/16 01/30/17  NA 139 139  K 4.2 4.3  BUN 17 19  CREATININE 0.7 0.8   Recent Labs    06/13/16 01/30/17  AST 14 17  ALT 9 11  ALKPHOS 63 68   Recent Labs    09/05/16 11/07/16 01/02/17  WBC 6.3 5.6  6.2  6.2  HGB 10.9* 11.6* 12.7  12.7  HCT 33* 34* 36  36  PLT 305 279 272   Lab Results  Component Value Date   TSH 2.00 09/21/2016   Lab Results  Component Value Date   HGBA1C 5.5 10/16/2014   Lab Results  Component Value Date   CHOL 137 03/01/2017   HDL 46 03/01/2017   LDLCALC 70 03/01/2017   LDLDIRECT 152.3 06/04/2009   TRIG 133 03/01/2017   CHOLHDL 3 09/13/2011    Significant Diagnostic Results in last 30 days:  No results found.  Assessment/Plan Dry eye dry irritable mild injected eyes, uncertain duration, no drainage, denied change of vision. Hx of macular degeneration. Will try artificial tears 1 gtt bid qid x 1 month, observe.   COPD with asthma ([place]) History of COPD with asthma, stable, continue Singulair 10mg  qd, Breo Ellipta 100/25mg  I puff daily, Albuterol HFA prn.       Family/ staff Communication: plan of care reviewed with the patient and charge nurse  Labs/tests ordered:  None  Time spend 25 minutes.

## 2017-03-27 NOTE — Assessment & Plan Note (Signed)
dry irritable mild injected eyes, uncertain duration, no drainage, denied change of vision. Hx of macular degeneration. Will try artificial tears 1 gtt bid qid x 1 month, observe.

## 2017-03-27 NOTE — Assessment & Plan Note (Signed)
History of COPD with asthma, stable, continue Singulair 10mg  qd, Breo Ellipta 100/25mg  I puff daily, Albuterol HFA prn.

## 2017-04-20 ENCOUNTER — Encounter: Payer: Self-pay | Admitting: Nurse Practitioner

## 2017-04-20 ENCOUNTER — Non-Acute Institutional Stay (SKILLED_NURSING_FACILITY): Payer: Medicare Other | Admitting: Nurse Practitioner

## 2017-04-20 DIAGNOSIS — G301 Alzheimer's disease with late onset: Secondary | ICD-10-CM

## 2017-04-20 DIAGNOSIS — F0280 Dementia in other diseases classified elsewhere without behavioral disturbance: Secondary | ICD-10-CM

## 2017-04-20 DIAGNOSIS — I10 Essential (primary) hypertension: Secondary | ICD-10-CM

## 2017-04-20 DIAGNOSIS — G8929 Other chronic pain: Secondary | ICD-10-CM | POA: Diagnosis not present

## 2017-04-20 DIAGNOSIS — M5440 Lumbago with sciatica, unspecified side: Secondary | ICD-10-CM | POA: Diagnosis not present

## 2017-04-20 DIAGNOSIS — Z7901 Long term (current) use of anticoagulants: Secondary | ICD-10-CM | POA: Diagnosis not present

## 2017-04-20 NOTE — Progress Notes (Signed)
Location:   SNF [facility] Room Number: 35 Place of Service:  SNF (31) Provider: Lennie Odor Dorena Dorfman NP  Blanchie Serve, MD  Patient Care Team: Blanchie Serve, MD as PCP - General (Internal Medicine) Annia Belt, MD as Consulting Physician (Hematology and Oncology) Suella Broad, MD as Consulting Physician (Physical Medicine and Rehabilitation) Noralee Space, MD (Pulmonary Disease) [place] Kehinde Bowdish X, NP as Nurse Practitioner (Nurse Practitioner) Inda Castle, MD (Inactive) as Consulting Physician (Gastroenterology)  Extended Emergency Contact Information Primary Emergency Contact: Knutson,Gene Address: [address]          [place], [place] [address] Johnnette Litter of [place] Phone: [phone] Work Phone: [phone] Mobile Phone: [phone] Relation: Son Secondary Emergency Contact: [name], [place] of [place] Phone: [phone] Relation: Son  Code Status:  DNR Goals of care: Advanced Directive information Advanced Directives 03/22/2017  Does Patient Have a Medical Advance Directive? Yes  Type of Paramedic of [place];Living will;Out of facility DNR (pink MOST or yellow form)  Does patient want to make changes to medical advance directive? No - Patient declined  Copy of [place] in Chart? Yes  Pre-existing out of facility DNR order (yellow form or pink MOST form) Yellow form placed in chart (order not valid for inpatient use)     Chief Complaint  Patient presents with  . Medical Management of Chronic Issues    HPI:  Pt is a 84 y.o. female seen today for medical management of chronic diseases.     The patient has history of dementia, resides in SNF, self propels wheelchair to get around, self transfer, on Memantine for memory. Chronic lower back pain is managed with Norco 10/325mg  bid/tid since 1//24/19. Blood pressure is controlled on  Metoprolol 25mg  bid. Chronic anticoagulation with Rivaroxaban 15mg  qd for history of DVTs.  Past Medical History:  Diagnosis Date  . Abnormal weight gain 05/09/2012  . Achalasia of esophagus 09/18/2013   09/18/13 Nifedipine 10mg  tid ac meals. 11/17/14 [facility] f/u as neeed   . Anxiety and depression 03/12/2012   Increased Sertraline to 50mg  daily 07/16/13 08/26/13 Mirtazapine 7.5mg  qhs  10/12/14 Pharm decreased Sertraline to 25mg .  05/04/15 TSH 2.89 09/15/15 pharm taper off Sertraline.  10/08/15 continue Sertraline 25mg  daily, POA desires. 12/02/15 wbc 6.1, Hgb 12.0, plt 271    . Asthmatic bronchitis   . BACK PAIN, LUMBAR 03/12/2007   Hx of lower back pain, had X-ray of her lumbar spine 09/08/12--spondylosis-better controlled with Hydrocodone/ApAp  10/325mg  ac and hs  01/01/13 lumbar spine inj. Able to ambulate with walker on unit until her fall 06/22/13. 06/25/13 unremarkable. X-ray lumbar spine. 11/10/13 dc Oxycodone prn-not used in 60 days.     . COLONIC POLYPS 03/12/2007   Initially noted April 2002 colonoscopy. Followup colonoscopy 05/31/2006 did not disclose any further polyps.    Marland Kitchen COPD (chronic obstructive pulmonary disease) ([place])   . Coronary artery disease   . Deep vein thrombosis of left lower extremity ([place]) 07/19/2012  . Dementia 09/16/2012   05/04/15 TSH 2.89   . Depression   . Diverticulosis 05/10/2012  . DJD (degenerative joint disease)   . Dry skin dermatitis 11/18/2012  . Edema of leg 07/01/2010  . Epistaxis 01/24/2016   01/21/16: x2, held Xarelto x 1 day, Saline Nasal spray prn, wbc 9.5, Hgb 12.4, Plt 331, Na 138, K 4.5, Bun 25, creat 0.89  01/22/16 XVQ00(86-76)  . Esophageal dysmotility 07/26/2013  . Esophageal dysphagia 05/27/2010   New dx of achalasia 6/ 2015 09/18/13 Nifedipine 10mg  tid ac meals.    . Esophageal stricture   . Essential hypertension, benign 10/30/2013   12/02/15 wbc 6.1, Hgb 12.0, plt 271   . Essential thrombocythemia ([place]) 06/09/2009  . Fall 06/25/2013  . GERD (gastroesophageal  reflux disease)   . Hip fracture ([place]) 09/08/2012  . HYPERCHOLESTEROLEMIA 03/12/2007   08/10/15 cholesterol 119, triglycerides 143, HDL 51, LDL 39   . Lumbar back pain   . MACULAR DEGENERATION 01/26/2008   Qualifier: Diagnosis of  By: Lenna Gilford MD, Deborra Medina   . Osteoarthritis 03/12/2007  . Osteoporosis 03/12/2007   Qualifier: Diagnosis of  By: Julien Girt CMA, Marliss Czar  06/15/14 dc Vit D 1000u po daily per pharm.    . Phlebitis and thrombophlebitis of other deep vessels of lower extremities 07/10/2010  . S/P balloon dilatation of esophageal stricture 07/28/2013   07/26/13. Dr. Henrene Pastor. EGD foreign removal and Balloon dilation of esophagus. Esophageal dysmotility, suspected achalasia, dilation at the GE junction to 69mm. Candida esophagitis.    . SYNCOPE 07/30/2009   Occurred 2011. Hospitalized. Felt due to dehydration.    . TRANSIENT ISCHEMIC ATTACKS, HX OF 03/12/2007   Qualifier: Diagnosis of  By: Julien Girt CMA, Marliss Czar     Past Surgical History:  Procedure Laterality Date  . ABDOMINAL HYSTERECTOMY     and bso  . APPENDECTOMY    . BOTOX INJECTION N/A 09/18/2013   Procedure: BOTOX INJECTION;  Surgeon: Lafayette Dragon, MD;  Location: [facility];  Service: Endoscopy;  Laterality: N/A;  . BOTOX INJECTION N/A 10/16/2014   Procedure: BOTOX INJECTION;  Surgeon: Inda Castle, MD;  Location: [facility];  Service: Endoscopy;  Laterality: N/A;  . BOTOX INJECTION N/A 05/18/2016   Procedure: BOTOX INJECTION;  Surgeon: Milus Banister, MD;  Location: [facility];  Service: Endoscopy;  Laterality: N/A;  . CATARACT EXTRACTION W/ INTRAOCULAR LENS  IMPLANT, BILATERAL    . CHOLECYSTECTOMY    . ESOPHAGEAL MANOMETRY N/A 08/25/2013   Procedure: ESOPHAGEAL MANOMETRY (EM);  Surgeon: Jerene Bears, MD;  Location: [facility];  Service: Gastroenterology;  Laterality: N/A;  . ESOPHAGOGASTRODUODENOSCOPY N/A 07/26/2013   Procedure: ESOPHAGOGASTRODUODENOSCOPY (EGD);  Surgeon: Irene Shipper, MD;  Location[facility] ENDOSCOPY;  Service: Endoscopy;   Laterality: N/A;  . ESOPHAGOGASTRODUODENOSCOPY N/A 10/16/2014   Procedure: ESOPHAGOGASTRODUODENOSCOPY (EGD);  Surgeon: Inda Castle, MD;  Location: [facility];  Service: Endoscopy;  Laterality: N/A;  with botox  . ESOPHAGOGASTRODUODENOSCOPY (EGD) WITH ESOPHAGEAL DILATION N/A 11/25/2012   Procedure: ESOPHAGOGASTRODUODENOSCOPY (EGD) WITH ESOPHAGEAL DILATION;  Surgeon: Jerene Bears, MD;  Location: [facility];  Service: Gastroenterology;  Laterality: N/A;  . ESOPHAGOGASTRODUODENOSCOPY (EGD) WITH PROPOFOL N/A 09/18/2013   Procedure: ESOPHAGOGASTRODUODENOSCOPY (EGD) WITH PROPOFOL;  Surgeon: Lafayette Dragon, MD;  Location: [facility];  Service: Endoscopy;  Laterality: N/A;  . ESOPHAGOGASTRODUODENOSCOPY (EGD) WITH PROPOFOL N/A 05/18/2016   Procedure: ESOPHAGOGASTRODUODENOSCOPY (EGD) WITH PROPOFOL;  Surgeon: Milus Banister, MD;  Location: [facility];  Service: Endoscopy;  Laterality: N/A;  . HIP ARTHROPLASTY Right 09/09/2012   Procedure: ARTHROPLASTY BIPOLAR HIP;  Surgeon: Mauri Pole, MD;  Location: [facility];  Service: Orthopedics;  Laterality: Right;  . TAH and BSO  08/21/1995    Allergies  Allergen Reactions  . Sulfamethoxazole Rash    Allergies as of 04/20/2017      Reactions   Sulfamethoxazole Rash      Medication List  Accurate as of 04/20/17 11:59 PM. Always use your most recent med list.          acetaminophen 325 MG tablet Commonly known as:  TYLENOL Take 650 mg by mouth every 4 (four) hours as needed for moderate pain.   albuterol 108 (90 Base) MCG/ACT inhaler Commonly known as:  PROVENTIL HFA;VENTOLIN HFA Inhale 2 puffs into the lungs every 6 (six) hours as needed for wheezing or shortness of breath.   atorvastatin 10 MG tablet Commonly known as:  LIPITOR Take 10 mg by mouth daily.   BREO ELLIPTA 100-25 MCG/INH Aepb Generic drug:  fluticasone furoate-vilanterol Inhale 1 puff into the lungs daily. One puff inhale once a day, rinse mouth after each use.     CHLORHEXIDINE GLUCONATE (BULK) Soln Take 5 mLs by mouth daily. 5 ml swish and spit for 30 seconds every day indefinitely   cholecalciferol 1000 units tablet Commonly known as:  VITAMIN D Take 1,000 Units by mouth daily.   DENTA 5000 PLUS 1.1 % Crea dental cream Generic drug:  sodium fluoride Place 1 application onto teeth at bedtime.   feeding supplement Liqd Take 90 mLs by mouth 2 (two) times daily.   Humidifier Misc Please use humidifier to prevent excessive dryness.   HYDROcodone-acetaminophen 10-325 MG tablet Commonly known as:  NORCO Take 1 tablet by mouth 3 (three) times daily.   hydroxyurea 500 MG capsule Commonly known as:  HYDREA Take 500-1,000 mg by mouth See admin instructions. Takes 1000 mg on Monday only Takes 500 mg on Sunday, Wednesday and Friday only   memantine 10 MG tablet Commonly known as:  NAMENDA Take 10 mg by mouth 2 (two) times daily.   metoprolol tartrate 25 MG tablet Commonly known as:  LOPRESSOR Take 25 mg by mouth 2 (two) times daily.   montelukast 10 MG tablet Commonly known as:  SINGULAIR Take 10 mg by mouth at bedtime.   NIFEdipine 10 MG capsule Commonly known as:  PROCARDIA Take 10 mg by mouth 3 (three) times daily. Squeeze contents of one capsule uner tongue 15-30 minutes before meals 3 times a day   nitroGLYCERIN 0.4 MG SL tablet Commonly known as:  NITROSTAT Place 0.4 mg under the tongue every 5 (five) minutes as needed for chest pain.   omeprazole 20 MG capsule Commonly known as:  PRILOSEC Take 20 mg by mouth at bedtime.   PRESERVISION AREDS 2 Caps Take 1 capsule by mouth 2 (two) times daily.   Rivaroxaban 15 MG Tabs tablet Commonly known as:  XARELTO Take 15 mg by mouth every evening. 5pm      ROS was provided with assistance of staff Review of Systems  Constitutional: Negative for activity change, appetite change, chills, diaphoresis, fatigue and fever.  HENT: Positive for hearing loss. Negative for congestion,  trouble swallowing and voice change.   Eyes: Negative for visual disturbance.  Respiratory: Negative for cough, choking, chest tightness, shortness of breath and wheezing.   Cardiovascular: Positive for leg swelling. Negative for chest pain and palpitations.  Gastrointestinal: Negative for abdominal distention, abdominal pain, constipation, diarrhea, nausea and vomiting.  Genitourinary: Negative for difficulty urinating, dysuria and urgency.  Musculoskeletal: Positive for back pain and gait problem. Negative for joint swelling.  Neurological: Negative for tremors, speech difficulty and weakness.       Dementia  Psychiatric/Behavioral: Positive for confusion. Negative for agitation, behavioral problems and hallucinations. The patient is not nervous/anxious.     Immunization History  Administered Date(s) Administered  . Influenza  Split 11/16/2010, 12/13/2011  . Influenza Whole 11/27/2009  . Influenza-Unspecified 12/31/2013, 11/18/2014, 12/14/2015, 12/11/2016  . PPD Test 09/11/2012  . Pneumococcal Polysaccharide-23 02/28/1988   Pertinent  Health Maintenance Due  Topic Date Due  . PNA vac Low Risk Adult (2 of 2 - PCV13) 02/27/1989  . INFLUENZA VACCINE  Completed  . DEXA SCAN  Completed   Fall Risk  02/08/2017 02/08/2017 10/04/2015 12/28/2014 10/23/2014  Falls in the past year? No Exclusion - non ambulatory No Yes Yes  Number falls in past yr: - - - 1 1  Injury with Fall? - - - No No  Risk for fall due to : - Impaired balance/gait;Impaired vision Impaired mobility;Impaired balance/gait History of fall(s);Impaired balance/gait;Impaired mobility Impaired mobility  Follow up - - - Falls prevention discussed Falls evaluation completed   Functional Status Survey:    Vitals:   04/20/17 1657  BP: 130/60  Pulse: 74  Resp: 20  Temp: 98.8 F (37.1 C)  SpO2: 96%   There is no height or weight on file to calculate BMI. Physical Exam  Constitutional: She appears well-developed and  well-nourished.  HENT:  Head: Normocephalic and atraumatic.  Eyes: Conjunctivae and EOM are normal. Pupils are equal, round, and reactive to light.  Neck: Normal range of motion. Neck supple. No JVD present.  Cardiovascular: Normal rate, regular rhythm and normal heart sounds.  No murmur heard. Pulmonary/Chest: She has no wheezes. She has no rales.  Decreased air entry.   Abdominal: Soft. Bowel sounds are normal. She exhibits no distension. There is no tenderness.  Musculoskeletal: Normal range of motion. She exhibits edema. She exhibits no tenderness.  Trace edema BLE  Neurological: She is alert. She exhibits normal muscle tone. Coordination normal.  Oriented to person and place.   Skin: Skin is warm and dry. No erythema.  Psychiatric: She has a normal mood and affect. Her behavior is normal.    Labs reviewed: Recent Labs    06/13/16 01/30/17  NA 139 139  K 4.2 4.3  BUN 17 19  CREATININE 0.7 0.8   Recent Labs    06/13/16 01/30/17  AST 14 17  ALT 9 11  ALKPHOS 63 68   Recent Labs    09/05/16 11/07/16 01/02/17  WBC 6.3 5.6 6.2  6.2  HGB 10.9* 11.6* 12.7  12.7  HCT 33* 34* 36  36  PLT 305 279 272   Lab Results  Component Value Date   TSH 2.00 09/21/2016   Lab Results  Component Value Date   HGBA1C 5.5 10/16/2014   Lab Results  Component Value Date   CHOL 137 03/01/2017   HDL 46 03/01/2017   LDLCALC 70 03/01/2017   LDLDIRECT 152.3 06/04/2009   TRIG 133 03/01/2017   CHOLHDL 3 09/13/2011    Significant Diagnostic Results in last 30 days:  No results found.  Assessment/Plan  Alzheimer's dementia without behavioral disturbance The patient has history of dementia, resides in SNF, self propels wheelchair to get around, self transfer, continue  Memantine for memory.   BACK PAIN, LUMBAR Chronic lower back pain is managed, continue Norco 10/325mg  bid/tid since 1//24/19.  Essential hypertension, benign  Blood pressure is controlled, continue Metoprolol 25mg   bid.   Long term current use of anticoagulant therapy Chronic anticoagulation with Rivaroxaban 15mg  qd for history of DVTs.     Family/ staff Communication: plan of care reviewed with the patient and charge nurse.   Labs/tests ordered:  none  Time spend 25 minutes.

## 2017-04-23 NOTE — Assessment & Plan Note (Signed)
The patient has history of dementia, resides in SNF, self propels wheelchair to get around, self transfer, continue  Memantine for memory.

## 2017-04-23 NOTE — Assessment & Plan Note (Signed)
Chronic lower back pain is managed, continue Norco 10/325mg  bid/tid since 1//24/19.

## 2017-04-23 NOTE — Assessment & Plan Note (Signed)
Blood pressure is controlled, continue Metoprolol 25mg bid.  

## 2017-04-23 NOTE — Assessment & Plan Note (Signed)
Chronic anticoagulation with Rivaroxaban 15mg  qd for history of DVTs.

## 2017-05-01 DIAGNOSIS — D649 Anemia, unspecified: Secondary | ICD-10-CM | POA: Diagnosis not present

## 2017-05-01 LAB — CBC AND DIFFERENTIAL
HCT: 37 (ref 36–46)
HEMOGLOBIN: 12.7 (ref 12.0–16.0)
PLATELETS: 326 (ref 150–399)
WBC: 6.3

## 2017-05-04 ENCOUNTER — Telehealth: Payer: Self-pay | Admitting: *Deleted

## 2017-05-04 NOTE — Telephone Encounter (Signed)
Received fax from [facility] - lab results. Called to Dr Beryle Beams -WBC 6.3 Plt 326 Hgb 12.7 RBC 3.63; stated "stay on same dose of Hydrea". Instructions called to Caren Griffins, RN at [facility]. Results to be scanned after MD signs.

## 2017-05-21 ENCOUNTER — Non-Acute Institutional Stay (SKILLED_NURSING_FACILITY): Payer: Medicare Other | Admitting: Nurse Practitioner

## 2017-05-21 ENCOUNTER — Encounter: Payer: Self-pay | Admitting: Nurse Practitioner

## 2017-05-21 DIAGNOSIS — F0280 Dementia in other diseases classified elsewhere without behavioral disturbance: Secondary | ICD-10-CM | POA: Diagnosis not present

## 2017-05-21 DIAGNOSIS — D473 Essential (hemorrhagic) thrombocythemia: Secondary | ICD-10-CM | POA: Diagnosis not present

## 2017-05-21 DIAGNOSIS — G8929 Other chronic pain: Secondary | ICD-10-CM

## 2017-05-21 DIAGNOSIS — J449 Chronic obstructive pulmonary disease, unspecified: Secondary | ICD-10-CM

## 2017-05-21 DIAGNOSIS — I82402 Acute embolism and thrombosis of unspecified deep veins of left lower extremity: Secondary | ICD-10-CM | POA: Diagnosis not present

## 2017-05-21 DIAGNOSIS — G301 Alzheimer's disease with late onset: Secondary | ICD-10-CM

## 2017-05-21 DIAGNOSIS — M5440 Lumbago with sciatica, unspecified side: Secondary | ICD-10-CM | POA: Diagnosis not present

## 2017-05-21 DIAGNOSIS — K219 Gastro-esophageal reflux disease without esophagitis: Secondary | ICD-10-CM | POA: Diagnosis not present

## 2017-05-21 DIAGNOSIS — I119 Hypertensive heart disease without heart failure: Secondary | ICD-10-CM | POA: Diagnosis not present

## 2017-05-21 NOTE — Assessment & Plan Note (Signed)
COPD stable, continue Albuterol HFA 2 puffs q6h prn, Breo Ellipta I puff daily, Singulair 10mg  qd. Marland Kitchen

## 2017-05-21 NOTE — Assessment & Plan Note (Signed)
HTN, blood pressure is controlled, continu e Metoprolol 25mg  bid, Lipitor 10mg  daily for cardiovascular risk reduction.

## 2017-05-21 NOTE — Progress Notes (Signed)
Location:  [facility] Room Number: 35 Place of Service:  SNF (31) Provider:  Kelsi Benham, [place]  NP  Blanchie Serve, MD  Patient Care Team: Blanchie Serve, MD as PCP - General (Internal Medicine) Annia Belt, MD as Consulting Physician (Hematology and Oncology) Suella Broad, MD as Consulting Physician (Physical Medicine and Rehabilitation) Noralee Space, MD (Pulmonary Disease) [place] Jenifer Struve X, NP as Nurse Practitioner (Nurse Practitioner) Inda Castle, MD (Inactive) as Consulting Physician (Gastroenterology)  Extended Emergency Contact Information Primary Emergency Contact: Prill,Gene Address: [address]          [place], [place] [address] Johnnette Litter of [place] Phone: [phone] Work Phone: [phone] Mobile Phone: [phone] Relation: Son Secondary Emergency Contact: [name], [place] of [place] Phone: [phone] Relation: Son  Code Status:  DNR Goals of care: Advanced Directive information Advanced Directives 05/21/2017  Does Patient Have a Medical Advance Directive? Yes  Type of Paramedic of [place];Living will;Out of facility DNR (pink MOST or yellow form)  Does patient want to make changes to medical advance directive? No - Patient declined  Copy of [place] in Chart? Yes  Pre-existing out of facility DNR order (yellow form or pink MOST form) Yellow form placed in chart (order not valid for inpatient use)     Chief Complaint  Patient presents with  . Medical Management of Chronic Issues    F/U - late onset Alzheimers    HPI:  Pt is a 84 y.o. female seen today for medical management of chronic diseases.     The patient has history of dementia, resides in SNF [facility], self transfer and propels wheelchair to get around, takes Namenda 10mg  qd for memory. Her lower back pain is managed with Norco 10325mg  bid  and prn Tylenol. COPD stable on Albuterol HFA 2 puffs q6h prn, Breo Ellipta I puff daily, Singulair 10mg  qd. . Essential thrombocytosis is managed with Hydrea per oncolocy. On chronic anticoagulation with Xarelto 25mg  qd for hx of DVTs. GERD stable on Omeprazole 20mg  qd, Nifedipine 10mg  tid. HTN, blood pressure is controlled on Metoprolol 25mg  bid, she takes Lipitor 10mg  daily for cardiovascular risk reduction.  Past Medical History:  Diagnosis Date  . Abnormal weight gain 05/09/2012  . Achalasia of esophagus 09/18/2013   09/18/13 Nifedipine 10mg  tid ac meals. 11/17/14 [facility] f/u as neeed   . Anxiety and depression 03/12/2012   Increased Sertraline to 50mg  daily 07/16/13 08/26/13 Mirtazapine 7.5mg  qhs  10/12/14 Pharm decreased Sertraline to 25mg .  05/04/15 TSH 2.89 09/15/15 pharm taper off Sertraline.  10/08/15 continue Sertraline 25mg  daily, POA desires. 12/02/15 wbc 6.1, Hgb 12.0, plt 271    . Asthmatic bronchitis   . BACK PAIN, LUMBAR 03/12/2007   Hx of lower back pain, had X-ray of her lumbar spine 09/08/12--spondylosis-better controlled with Hydrocodone/ApAp  10/325mg  ac and hs  01/01/13 lumbar spine inj. Able to ambulate with walker on unit until her fall 06/22/13. 06/25/13 unremarkable. X-ray lumbar spine. 11/10/13 dc Oxycodone prn-not used in 60 days.     . COLONIC POLYPS 03/12/2007   Initially noted April 2002 colonoscopy. Followup colonoscopy 05/31/2006 did not disclose any further polyps.    Marland Kitchen COPD (chronic obstructive pulmonary disease) ([place])   . Coronary artery disease   . Deep vein thrombosis of left lower extremity ([place]) 07/19/2012  . Dementia 09/16/2012   05/04/15 TSH  2.89   . Depression   . Diverticulosis 05/10/2012  . DJD (degenerative joint disease)   . Dry skin dermatitis 11/18/2012  . Edema of leg 07/01/2010  . Epistaxis 01/24/2016   01/21/16: x2, held Xarelto x 1 day, Saline Nasal spray prn, wbc 9.5, Hgb 12.4, Plt 331, Na 138, K 4.5, Bun 25, creat 0.89 01/22/16 [id number](83-66)  . Esophageal  dysmotility 07/26/2013  . Esophageal dysphagia 05/27/2010   New dx of achalasia 6/ 2015 09/18/13 Nifedipine 10mg  tid ac meals.    . Esophageal stricture   . Essential hypertension, benign 10/30/2013   12/02/15 wbc 6.1, Hgb 12.0, plt 271   . Essential thrombocythemia ([place]) 06/09/2009  . Fall 06/25/2013  . GERD (gastroesophageal reflux disease)   . Hip fracture ([place]) 09/08/2012  . HYPERCHOLESTEROLEMIA 03/12/2007   08/10/15 cholesterol 119, triglycerides 143, HDL 51, LDL 39   . Lumbar back pain   . MACULAR DEGENERATION 01/26/2008   Qualifier: Diagnosis of  By: Lenna Gilford MD, Deborra Medina   . Osteoarthritis 03/12/2007  . Osteoporosis 03/12/2007   Qualifier: Diagnosis of  By: Julien Girt CMA, Marliss Czar  06/15/14 dc Vit D 1000u po daily per pharm.    . Phlebitis and thrombophlebitis of other deep vessels of lower extremities 07/10/2010  . S/P balloon dilatation of esophageal stricture 07/28/2013   07/26/13. Dr. Henrene Pastor. EGD foreign removal and Balloon dilation of esophagus. Esophageal dysmotility, suspected achalasia, dilation at the GE junction to 23mm. Candida esophagitis.    . SYNCOPE 07/30/2009   Occurred 2011. Hospitalized. Felt due to dehydration.    . TRANSIENT ISCHEMIC ATTACKS, HX OF 03/12/2007   Qualifier: Diagnosis of  By: Julien Girt CMA, Marliss Czar     Past Surgical History:  Procedure Laterality Date  . ABDOMINAL HYSTERECTOMY     and bso  . APPENDECTOMY    . BOTOX INJECTION N/A 09/18/2013   Procedure: BOTOX INJECTION;  Surgeon: Lafayette Dragon, MD;  Location: [facility] END[facility]COPY;  Service: Endoscopy;  Laterality: N/A;  . BOTOX INJECTION N/A 10/16/2014   Procedure: BOTOX INJECTION;  Surgeon: Inda Castle, MD;  Location: [facility];  Service: Endoscopy;  Laterality: N/A;  . BOTOX INJECTION N/A 05/18/2016   Procedure: BOTOX INJECTION;  Surgeon: Milus Banister, MD;  Location: [facility];  Service: Endoscopy;  Laterality: N/A;  . CATARACT EXTRACTION W/ INTRAOCULAR LENS  IMPLANT, BILATERAL    . CHOLECYSTECTOMY    . ESOPHAGEAL  MANOMETRY N/A 08/25/2013   Procedure: ESOPHAGEAL MANOMETRY (EM);  Surgeon: Jerene Bears, MD;  Location: [facility];  Service: Gastroenterology;  Laterality: N/A;  . ESOPHAGOGASTRODUODENOSCOPY N/A 07/26/2013   Procedure: ESOPHAGOGASTRODUODENOSCOPY (EGD);  Surgeon: Irene Shipper, MD;  [facility] ENDOSCOPY;  Service: Endoscopy;  Laterality: N/A;  . ESOPHAGOGASTRODUODENOSCOPY N/A 10/16/2014   Procedure: ESOPHAGOGASTRODUODENOSCOPY (EGD);  Surgeon: Inda Castle, MD;  Location: [facility];  Service: Endoscopy;  Laterality: N/A;  with botox  . ESOPHAGOGASTRODUODENOSCOPY (EGD) WITH ESOPHAGEAL DILATION N/A 11/25/2012   Procedure: ESOPHAGOGASTRODUODENOSCOPY (EGD) WITH ESOPHAGEAL DILATION;  Surgeon: Jerene Bears, MD;  Location: [facility];  Service: Gastroenterology;  Laterality: N/A;  . ESOPHAGOGASTRODUODENOSCOPY (EGD) WITH PROPOFOL N/A 09/18/2013   Procedure: ESOPHAGOGASTRODUODENOSCOPY (EGD) WITH PROPOFOL;  Surgeon: Lafayette Dragon, MD;  Location: [facility];  Service: Endoscopy;  Laterality: N/A;  . ESOPHAGOGASTRODUODENOSCOPY (EGD) WITH PROPOFOL N/A 05/18/2016   Procedure: ESOPHAGOGASTRODUODENOSCOPY (EGD) WITH PROPOFOL;  Surgeon: Milus Banister, MD;  Location: [facility];  Service: Endoscopy;  Laterality: N/A;  . HIP ARTHROPLASTY Right 09/09/2012   Procedure: ARTHROPLASTY BIPOLAR HIP;  Surgeon: Mauri Pole, MD;  Location: [facility]S;  Service: Orthopedics;  Laterality: Right;  . TAH and BSO  08/21/1995    Allergies  Allergen Reactions  . Sulfamethoxazole Rash    Outpatient Encounter Medications as of 05/21/2017  Medication Sig  . atorvastatin (LIPITOR) 10 MG tablet Take 10 mg by mouth daily.  . CHLORHEXIDINE GLUCONATE, BULK, SOLN Take 5 mLs by mouth daily. 5 ml swish and spit for 30 seconds every day indefinitely   . cholecalciferol (VITAMIN D) 1000 units tablet Take 1,000 Units by mouth daily.  . feeding supplement (RESOURCE BREEZE) LIQD Take 90 mLs by mouth 2 (two) times daily.   .  fluticasone furoate-vilanterol (BREO ELLIPTA) 100-25 MCG/INH AEPB Inhale 1 puff into the lungs daily. One puff inhale once a day, rinse mouth after each use.   . Humidifier MISC Please use humidifier to prevent excessive dryness.  Marland Kitchen HYDROcodone-acetaminophen (NORCO) 10-325 MG per tablet Take 1 tablet by mouth 3 (three) times daily.   . hydroxyurea (HYDREA) 500 MG capsule Take 500-1,000 mg by mouth See admin instructions. Takes 1000 mg on Monday only Takes 500 mg on Sunday, Wednesday and Friday only  . memantine (NAMENDA) 10 MG tablet Take 10 mg by mouth 2 (two) times daily.   . metoprolol tartrate (LOPRESSOR) 25 MG tablet Take 25 mg by mouth 2 (two) times daily.  . montelukast (SINGULAIR) 10 MG tablet Take 10 mg by mouth at bedtime.  . Multiple Vitamins-Minerals (PRESERVISION AREDS 2) CAPS Take 1 capsule by mouth 2 (two) times daily.  Marland Kitchen NIFEdipine (PROCARDIA) 10 MG capsule Take 10 mg by mouth 3 (three) times daily. Squeeze contents of one capsule uner tongue 15-30 minutes before meals 3 times a day   . nitroGLYCERIN (NITROSTAT) 0.4 MG SL tablet Place 0.4 mg under the tongue every 5 (five) minutes as needed for chest pain.  Marland Kitchen omeprazole (PRILOSEC) 20 MG capsule Take 20 mg by mouth at bedtime.   . Rivaroxaban (XARELTO) 15 MG TABS tablet Take 15 mg by mouth every evening. 5pm  . sodium fluoride (DENTA 5000 PLUS) 1.1 % CREA dental cream Place 1 application onto teeth at bedtime.  Marland Kitchen acetaminophen (TYLENOL) 325 MG tablet Take 650 mg by mouth every 4 (four) hours as needed for moderate pain.   Marland Kitchen albuterol (PROVENTIL HFA;VENTOLIN HFA) 108 (90 BASE) MCG/ACT inhaler Inhale 2 puffs into the lungs every 6 (six) hours as needed for wheezing or shortness of breath.   No facility-administered encounter medications on file as of 05/21/2017.    ROS was provided with assistance of staff Review of Systems  Constitutional: Negative for activity change, appetite change, chills, diaphoresis, fatigue and fever.  HENT:  Positive for hearing loss. Negative for congestion, trouble swallowing and voice change.   Respiratory: Positive for cough. Negative for choking, chest tightness, shortness of breath and wheezing.   Cardiovascular: Positive for leg swelling. Negative for chest pain.  Gastrointestinal: Negative for abdominal distention, abdominal pain, constipation, diarrhea, nausea and vomiting.  Genitourinary: Negative for difficulty urinating, dysuria and urgency.  Musculoskeletal: Positive for back pain and gait problem.  Skin: Negative for color change and pallor.  Neurological: Negative for dizziness, tremors, weakness and headaches.       Dementia  Psychiatric/Behavioral: Positive for confusion. Negative for agitation, behavioral problems, hallucinations and sleep disturbance. The patient is not nervous/anxious.     Immunization History  Administered Date(s) Administered  . Influenza Split 11/16/2010, 12/13/2011  . Influenza Whole 11/27/2009  . Influenza-Unspecified 12/31/2013,  11/18/2014, 12/14/2015, 12/11/2016  . PPD Test 09/11/2012  . Pneumococcal Polysaccharide-23 02/28/1988   Pertinent  Health Maintenance Due  Topic Date Due  . PNA vac Low Risk Adult (2 of 2 - PCV13) 02/27/1989  . INFLUENZA VACCINE  Completed  . DEXA SCAN  Completed   Fall Risk  02/08/2017 02/08/2017 10/04/2015 12/28/2014 10/23/2014  Falls in the past year? No Exclusion - non ambulatory No Yes Yes  Number falls in past yr: - - - 1 1  Injury with Fall? - - - No No  Risk for fall due to : - Impaired balance/gait;Impaired vision Impaired mobility;Impaired balance/gait History of fall(s);Impaired balance/gait;Impaired mobility Impaired mobility  Follow up - - - Falls prevention discussed Falls evaluation completed   Functional Status Survey:    Vitals:   05/21/17 1202  BP: (!) 156/78  Pulse: 72  Resp: 20  Temp: 98.3 F (36.8 C)  Weight: 99 lb (44.9 kg)  Height: 5\' 1"  (1.549 m)   Body mass index is 18.71  kg/m. Physical Exam  Constitutional: She appears well-developed and well-nourished.  HENT:  Head: Normocephalic and atraumatic.  Eyes: Pupils are equal, round, and reactive to light. Conjunctivae and EOM are normal. Right eye exhibits no discharge. Left eye exhibits no discharge.  Neck: Normal range of motion. Neck supple. No JVD present. No thyromegaly present.  Cardiovascular: Normal rate and regular rhythm.  No murmur heard. Pulmonary/Chest: She has no wheezes. She has no rales.  Decreased air movement.   Abdominal: Soft. Bowel sounds are normal. She exhibits no distension. There is no tenderness.  Musculoskeletal: She exhibits edema.  Trace edema BLE, self transfer, self propels wheelchair to get around.   Neurological: She is alert. She exhibits normal muscle tone. Coordination normal.  Oriented to person and place  Skin: Skin is warm and dry.  Psychiatric: She has a normal mood and affect. Her behavior is normal.    Labs reviewed: Recent Labs    06/13/16 01/30/17  NA 139 139  K 4.2 4.3  BUN 17 19  CREATININE 0.7 0.8   Recent Labs    06/13/16 01/30/17  AST 14 17  ALT 9 11  ALKPHOS 63 68   Recent Labs    11/07/16 01/02/17 05/01/17  WBC 5.6 6.2  6.2 6.3  HGB 11.6* 12.7  12.7 12.7  HCT 34* 36  36 37  PLT 279 272 326   Lab Results  Component Value Date   TSH 2.00 09/21/2016   Lab Results  Component Value Date   HGBA1C 5.5 10/16/2014   Lab Results  Component Value Date   CHOL 137 03/01/2017   HDL 46 03/01/2017   LDLCALC 70 03/01/2017   LDLDIRECT 152.3 06/04/2009   TRIG 133 03/01/2017   CHOLHDL 3 09/13/2011    Significant Diagnostic Results in last 30 days:  No results found.  Assessment/Plan Hypertensive heart disease HTN, blood pressure is controlled, continu e Metoprolol 25mg  bid, Lipitor 10mg  daily for cardiovascular risk reduction.    GERD GERD stable, continue  Omeprazole 20mg  qd, Nifedipine 10mg  tid.   Deep vein thrombosis of left lower  extremity Continue chronic anticoagulation with Xarelto 2.5mg  qd for hx of DVTs.   Essential thrombocythemia ([place]) Essential thrombocytosis is managed with Hydrea per oncolocy. 05/01/17 plt 326  COPD with asthma (HCC) COPD stable, continue Albuterol HFA 2 puffs q6h prn, Breo Ellipta I puff daily, Singulair 10mg  qd. Marland Kitchen   BACK PAIN, LUMBAR Her lower back pain is managed, continue Norco 10325mg   bid and prn Tylenol.   Alzheimer's dementia without behavioral disturbance dementia, resides in SNF [facility], self transfer and propels wheelchair to get around, continue  Namenda 10mg  qd for memory.      Family/ staff Communication: plan of care reviewed with the patient and charge nurse. Prevnar 12 and tDap prescription written.   Labs/tests ordered:  None  Time spend 25 minutes.

## 2017-05-21 NOTE — Assessment & Plan Note (Signed)
Essential thrombocytosis is managed with Hydrea per oncolocy. 05/01/17 plt 326

## 2017-05-21 NOTE — Assessment & Plan Note (Signed)
GERD stable, continue  Omeprazole 20mg  qd, Nifedipine 10mg  tid.

## 2017-05-21 NOTE — Assessment & Plan Note (Signed)
dementia, resides in SNF [facility], self transfer and propels wheelchair to get around, continue  Namenda 10mg  qd for memory.

## 2017-05-21 NOTE — Assessment & Plan Note (Signed)
Her lower back pain is managed, continue Norco 10325mg  bid and prn Tylenol.

## 2017-05-21 NOTE — Assessment & Plan Note (Signed)
Continue chronic anticoagulation with Xarelto 2.5mg  qd for hx of DVTs.

## 2017-06-21 ENCOUNTER — Encounter: Payer: Self-pay | Admitting: Internal Medicine

## 2017-06-21 ENCOUNTER — Non-Acute Institutional Stay (SKILLED_NURSING_FACILITY): Payer: Medicare Other | Admitting: Internal Medicine

## 2017-06-21 DIAGNOSIS — I25119 Atherosclerotic heart disease of native coronary artery with unspecified angina pectoris: Secondary | ICD-10-CM | POA: Diagnosis not present

## 2017-06-21 DIAGNOSIS — Z7901 Long term (current) use of anticoagulants: Secondary | ICD-10-CM

## 2017-06-21 DIAGNOSIS — F0280 Dementia in other diseases classified elsewhere without behavioral disturbance: Secondary | ICD-10-CM

## 2017-06-21 DIAGNOSIS — K220 Achalasia of cardia: Secondary | ICD-10-CM | POA: Diagnosis not present

## 2017-06-21 DIAGNOSIS — Z86718 Personal history of other venous thrombosis and embolism: Secondary | ICD-10-CM | POA: Diagnosis not present

## 2017-06-21 DIAGNOSIS — E43 Unspecified severe protein-calorie malnutrition: Secondary | ICD-10-CM | POA: Diagnosis not present

## 2017-06-21 DIAGNOSIS — G301 Alzheimer's disease with late onset: Secondary | ICD-10-CM

## 2017-06-21 DIAGNOSIS — E7800 Pure hypercholesterolemia, unspecified: Secondary | ICD-10-CM

## 2017-06-21 NOTE — Progress Notes (Signed)
Location:  [facility] Room Number: 35 Place of Service:  SNF [phone]) Provider:  Blanchie Serve MD  Blanchie Serve, MD  Patient Care Team: Blanchie Serve, MD as PCP - General (Internal Medicine) Annia Belt, MD as Consulting Physician (Hematology and Oncology) Suella Broad, MD as Consulting Physician (Physical Medicine and Rehabilitation) Noralee Space, MD (Pulmonary Disease) [name], [name], NP as Nurse Practitioner (Nurse Practitioner) Inda Castle, MD (Inactive) as Consulting Physician (Gastroenterology)  Extended Emergency Contact Information Primary Emergency Contact: Livecchi,Gene Address: [address]          [place], [place] [address] Johnnette Litter of [place] Phone: [phone] Work Phone: [phone] Mobile Phone: [phone] Relation: Son Secondary Emergency Contact: [name], [place] of [place] Phone: [phone] Relation: Son  Code Status:  DNR  Goals of care: Advanced Directive information Advanced Directives 05/21/2017  Does Patient Have a Medical Advance Directive? Yes  Type of Paramedic of [place];Living will;Out of facility DNR (pink MOST or yellow form)  Does patient want to make changes to medical advance directive? No - Patient declined  Copy of [place] in Chart? Yes  Pre-existing out of facility DNR order (yellow form or pink MOST form) Yellow form placed in chart (order not valid for inpatient use)     Chief Complaint  Patient presents with  . Medical Management of Chronic Issues    F/U- Alzheimers, Hypertensive heart disease,    HPI:  Pt is a 84 y.o. female seen today for routine visit. She denies any concern. She has memory issue and is pleasantly confused. No acute concern from nursing.   Coronary atherosclerosis- chest pain free. Currently takes metoprolol tartrate 25 mg bid and  statin  History of DVT- tolerating rivaroxaban well. No bleed reported.   Protein calorie malnutrition- takes feeding supplement, appetite fair, denies nausea, vomiting or abdominal pain, weight remains low  Long term anticoagulation- denies bleeding, takes rivaroxaban 15 mg qhs  Hyperlipidemia- denies muscle aches, takes atorvastatin 10 mg daily  Dementia- no behavioral disturbance reported. Taking memantine 10 mg bid.   achalasia of esophagus- denies dysphagia or reflux symptom, currently on nifedipine 10 mg tid.    Past Medical History:  Diagnosis Date  . Abnormal weight gain 05/09/2012  . Achalasia of esophagus 09/18/2013   09/18/13 Nifedipine 10mg  tid ac meals. 11/17/14 [facility] f/u as neeed   . Anxiety and depression 03/12/2012   Increased Sertraline to 50mg  daily 07/16/13 08/26/13 Mirtazapine 7.5mg  qhs  10/12/14 Pharm decreased Sertraline to 25mg .  05/04/15 TSH 2.89 09/15/15 pharm taper off Sertraline.  10/08/15 continue Sertraline 25mg  daily, POA desires. 12/02/15 wbc 6.1, Hgb 12.0, plt 271    . Asthmatic bronchitis   . BACK PAIN, LUMBAR 03/12/2007   Hx of lower back pain, had X-ray of her lumbar spine 09/08/12--spondylosis-better controlled with Hydrocodone/ApAp  10/325mg  ac and hs  01/01/13 lumbar spine inj. Able to ambulate with walker on unit until her fall 06/22/13. 06/25/13 unremarkable. X-ray lumbar spine. 11/10/13 dc Oxycodone prn-not used in 60 days.     . COLONIC POLYPS 03/12/2007   Initially noted April 2002 colonoscopy. Followup colonoscopy 05/31/2006 did not disclose any further polyps.    Marland Kitchen COPD (chronic obstructive pulmonary disease) ([place])   . Coronary artery disease   . Deep vein thrombosis of left lower extremity ([place]) 07/19/2012  . Dementia 09/16/2012  05/04/15 TSH 2.89   . Depression   . Diverticulosis 05/10/2012  . DJD (degenerative joint disease)   . Dry skin dermatitis 11/18/2012  . Edema of leg 07/01/2010  . Epistaxis 01/24/2016   01/21/16: x2, held Xarelto x 1 day, Saline  Nasal spray prn, wbc 9.5, Hgb 12.4, Plt 331, Na 138, K 4.5, Bun 25, creat 0.89 01/22/16 WCB76(28-31)  . Esophageal dysmotility 07/26/2013  . Esophageal dysphagia 05/27/2010   New dx of achalasia 6/ 2015 09/18/13 Nifedipine 10mg  tid ac meals.    . Esophageal stricture   . Essential hypertension, benign 10/30/2013   12/02/15 wbc 6.1, Hgb 12.0, plt 271   . Essential thrombocythemia ([place]) 06/09/2009  . Fall 06/25/2013  . GERD (gastroesophageal reflux disease)   . Hip fracture ([place]) 09/08/2012  . HYPERCHOLESTEROLEMIA 03/12/2007   08/10/15 cholesterol 119, triglycerides 143, HDL 51, LDL 39   . Lumbar back pain   . MACULAR DEGENERATION 01/26/2008   Qualifier: Diagnosis of  By: Lenna Gilford MD, Deborra Medina   . Osteoarthritis 03/12/2007  . Osteoporosis 03/12/2007   Qualifier: Diagnosis of  By: Julien Girt CMA, Marliss Czar  06/15/14 dc Vit D 1000u po daily per pharm.    . Phlebitis and thrombophlebitis of other deep vessels of lower extremities 07/10/2010  . S/P balloon dilatation of esophageal stricture 07/28/2013   07/26/13. Dr. Henrene Pastor. EGD foreign removal and Balloon dilation of esophagus. Esophageal dysmotility, suspected achalasia, dilation at the GE junction to 37mm. Candida esophagitis.    . SYNCOPE 07/30/2009   Occurred 2011. Hospitalized. Felt due to dehydration.    . TRANSIENT ISCHEMIC ATTACKS, HX OF 03/12/2007   Qualifier: Diagnosis of  By: Julien Girt CMA, Marliss Czar     Past Surgical History:  Procedure Laterality Date  . ABDOMINAL HYSTERECTOMY     and bso  . APPENDECTOMY    . BOTOX INJECTION N/A 09/18/2013   Procedure: BOTOX INJECTION;  Surgeon: Lafayette Dragon, MD;  Location: [facility];  Service: Endoscopy;  Laterality: N/A;  . BOTOX INJECTION N/A 10/16/2014   Procedure: BOTOX INJECTION;  Surgeon: Inda Castle, MD;  Location: [facility];  Service: Endoscopy;  Laterality: N/A;  . BOTOX INJECTION N/A 05/18/2016   Procedure: BOTOX INJECTION;  Surgeon: Milus Banister, MD;  Location: [facility];  Service: Endoscopy;   Laterality: N/A;  . CATARACT EXTRACTION W/ INTRAOCULAR LENS  IMPLANT, BILATERAL    . CHOLECYSTECTOMY    . ESOPHAGEAL MANOMETRY N/A 08/25/2013   Procedure: ESOPHAGEAL MANOMETRY (EM);  Surgeon: Jerene Bears, MD;  Location: [facility];  Service: Gastroenterology;  Laterality: N/A;  . ESOPHAGOGASTRODUODENOSCOPY N/A 07/26/2013   Procedure: ESOPHAGOGASTRODUODENOSCOPY (EGD);  Surgeon: Irene Shipper, MD;  [facility];  Service: Endoscopy;  Laterality: N/A;  . ESOPHAGOGASTRODUODENOSCOPY N/A 10/16/2014   Procedure: ESOPHAGOGASTRODUODENOSCOPY (EGD);  Surgeon: Inda Castle, MD;  Location: [facility];  Service: Endoscopy;  Laterality: N/A;  with botox  . ESOPHAGOGASTRODUODENOSCOPY (EGD) WITH ESOPHAGEAL DILATION N/A 11/25/2012   Procedure: ESOPHAGOGASTRODUODENOSCOPY (EGD) WITH ESOPHAGEAL DILATION;  Surgeon: Jerene Bears, MD;  Location: [facility];  Service: Gastroenterology;  Laterality: N/A;  . ESOPHAGOGASTRODUODENOSCOPY (EGD) WITH PROPOFOL N/A 09/18/2013   Procedure: ESOPHAGOGASTRODUODENOSCOPY (EGD) WITH PROPOFOL;  Surgeon: Lafayette Dragon, MD;  Location: [facility];  Service: Endoscopy;  Laterality: N/A;  . ESOPHAGOGASTRODUODENOSCOPY (EGD) WITH PROPOFOL N/A 05/18/2016   Procedure: ESOPHAGOGASTRODUODENOSCOPY (EGD) WITH PROPOFOL;  Surgeon: Milus Banister, MD;  Location: [facility];  Service: Endoscopy;  Laterality: N/A;  . HIP ARTHROPLASTY Right 09/09/2012   Procedure: ARTHROPLASTY  BIPOLAR HIP;  Surgeon: Mauri Pole, MD;  Location: [facility];  Service: Orthopedics;  Laterality: Right;  . TAH and BSO  08/21/1995    Allergies  Allergen Reactions  . Sulfamethoxazole Rash    Outpatient Encounter Medications as of 06/21/2017  Medication Sig  . acetaminophen (TYLENOL) 325 MG tablet Take 650 mg by mouth every 4 (four) hours as needed for moderate pain.   Marland Kitchen albuterol (PROVENTIL HFA;VENTOLIN HFA) 108 (90 BASE) MCG/ACT inhaler Inhale 2 puffs into the lungs every 6 (six) hours as needed for wheezing  or shortness of breath.  Marland Kitchen atorvastatin (LIPITOR) 10 MG tablet Take 10 mg by mouth daily.  . CHLORHEXIDINE GLUCONATE, BULK, SOLN Take 5 mLs by mouth daily. 5 ml swish and spit for 30 seconds every day indefinitely   . cholecalciferol (VITAMIN D) 1000 units tablet Take 1,000 Units by mouth daily.  . feeding supplement (RESOURCE BREEZE) LIQD Take 90 mLs by mouth 2 (two) times daily.   . fluticasone furoate-vilanterol (BREO ELLIPTA) 100-25 MCG/INH AEPB Inhale 1 puff into the lungs daily. One puff inhale once a day, rinse mouth after each use.   . Humidifier MISC Please use humidifier to prevent excessive dryness.  Marland Kitchen HYDROcodone-acetaminophen (NORCO) 10-325 MG per tablet Take 1 tablet by mouth 3 (three) times daily.   . hydroxyurea (HYDREA) 500 MG capsule Take 500-1,000 mg by mouth See admin instructions. Takes 1000 mg on Monday only Takes 500 mg on Sunday, Wednesday and Friday only  . memantine (NAMENDA) 10 MG tablet Take 10 mg by mouth 2 (two) times daily.   . metoprolol tartrate (LOPRESSOR) 25 MG tablet Take 25 mg by mouth 2 (two) times daily.  . montelukast (SINGULAIR) 10 MG tablet Take 10 mg by mouth at bedtime.  . Multiple Vitamins-Minerals (PRESERVISION AREDS 2) CAPS Take 1 capsule by mouth 2 (two) times daily.  Marland Kitchen NIFEdipine (PROCARDIA) 10 MG capsule Take 10 mg by mouth 3 (three) times daily. Squeeze contents of one capsule uner tongue 15-30 minutes before meals 3 times a day   . nitroGLYCERIN (NITROSTAT) 0.4 MG SL tablet Place 0.4 mg under the tongue every 5 (five) minutes as needed for chest pain.  Marland Kitchen omeprazole (PRILOSEC) 20 MG capsule Take 20 mg by mouth at bedtime.   . Rivaroxaban (XARELTO) 15 MG TABS tablet Take 15 mg by mouth every evening. 5pm  . sodium fluoride (DENTA 5000 PLUS) 1.1 % CREA dental cream Place 1 application onto teeth at bedtime.   No facility-administered encounter medications on file as of 06/21/2017.     Review of Systems  Constitutional: Negative for activity  change, appetite change, chills, diaphoresis and fever.  HENT: Negative for congestion, ear discharge, hearing loss, mouth sores, nosebleeds, rhinorrhea, sore throat and trouble swallowing.   Eyes: Positive for visual disturbance.       Has corrective glasses  Respiratory: Positive for shortness of breath. Negative for cough and wheezing.        Has dyspnea with exertion  Cardiovascular: Positive for leg swelling. Negative for chest pain and palpitations.  Gastrointestinal: Negative for abdominal distention, abdominal pain, constipation, nausea and vomiting.  Genitourinary: Negative for dysuria, frequency and hematuria.       Has urinary incontinence, continent with bowel  Musculoskeletal: Positive for arthralgias and gait problem. Negative for back pain.       1 person minimum assistance with transfers, wheelchair for ambulation, no fall reported, able to wheel herself around  Skin: Negative for rash.  Neurological: Negative  for dizziness and headaches.  Hematological: Bruises/bleeds easily.  Psychiatric/Behavioral: Positive for confusion. Negative for behavioral problems and dysphoric mood. The patient is not nervous/anxious.     Immunization History  Administered Date(s) Administered  . Influenza Split 11/16/2010, 12/13/2011  . Influenza Whole 11/27/2009  . Influenza-Unspecified 12/31/2013, 11/18/2014, 12/14/2015, 12/11/2016  . PPD Test 09/11/2012  . Pneumococcal Polysaccharide-23 02/28/1988   Pertinent  Health Maintenance Due  Topic Date Due  . PNA vac Low Risk Adult (2 of 2 - PCV13) 02/27/1989  . INFLUENZA VACCINE  09/27/2017  . DEXA SCAN  Completed   Fall Risk  02/08/2017 02/08/2017 10/04/2015 12/28/2014 10/23/2014  Falls in the past year? No Exclusion - non ambulatory No Yes Yes  Number falls in past yr: - - - 1 1  Injury with Fall? - - - No No  Risk for fall due to : - Impaired balance/gait;Impaired vision Impaired mobility;Impaired balance/gait History of fall(s);Impaired  balance/gait;Impaired mobility Impaired mobility  Follow up - - - Falls prevention discussed Falls evaluation completed   Functional Status Survey:    Vitals:   06/21/17 1015  BP: 128/68  Pulse: 62  Resp: 18  Temp: 97.7 F (36.5 C)  Weight: 98 lb 8 oz (44.7 kg)  Height: 5\' 1"  (1.549 m)   Body mass index is 18.61 kg/m.   Wt Readings from Last 3 Encounters:  06/21/17 98 lb 8 oz (44.7 kg)  05/21/17 99 lb (44.9 kg)  03/27/17 100 lb (45.4 kg)   Physical Exam  Constitutional:  Thin built and frail, in no distress  HENT:  Head: Normocephalic and atraumatic.  Mouth/Throat: Oropharynx is clear and moist. No oropharyngeal exudate.  Hearing aid present  Eyes: Pupils are equal, round, and reactive to light. Conjunctivae and EOM are normal. Right eye exhibits no discharge. Left eye exhibits no discharge.  Wears corrective glasses  Neck: Neck supple.  Cardiovascular: Normal rate and regular rhythm.  Pulmonary/Chest: Effort normal and breath sounds normal.  Abdominal: Soft. Bowel sounds are normal. There is no tenderness. There is no guarding.  Musculoskeletal: She exhibits edema and deformity. She exhibits no tenderness.  Can move all 4 extremities, leg weakness, uses wheelchair for ambulation, unsteady gait  Lymphadenopathy:    She has no cervical adenopathy.  Neurological: She is alert.  Pleasantly confused, oriented to self and place only  Skin: Skin is warm and dry. No rash noted. She is not diaphoretic.  Psychiatric: She has a normal mood and affect.    Labs reviewed: Recent Labs    01/30/17  NA 139  K 4.3  BUN 19  CREATININE 0.8   Recent Labs    01/30/17  AST 17  ALT 11  ALKPHOS 68   Recent Labs    11/07/16 01/02/17 05/01/17  WBC 5.6 6.2  6.2 6.3  HGB 11.6* 12.7  12.7 12.7  HCT 34* 36  36 37  PLT 279 272 326   Lab Results  Component Value Date   TSH 2.00 09/21/2016   Lab Results  Component Value Date   HGBA1C 5.5 10/16/2014   Lab Results    Component Value Date   CHOL 137 03/01/2017   HDL 46 03/01/2017   LDLCALC 70 03/01/2017   LDLDIRECT 152.3 06/04/2009   TRIG 133 03/01/2017   CHOLHDL 3 09/13/2011    Significant Diagnostic Results in last 30 days:  No results found.  Assessment/Plan  1. Severe protein-calorie malnutrition ([place]) Continue nutritional support and monitor po intake. Pressure ulcer prevention  2. Atherosclerosis of native coronary artery of native heart with angina pectoris ([place]) Chest pain free. Continue metoprolol and statin with prn NTG  3. History of DVT (deep vein thrombosis) Continue rivaroxaban  4. Achalasia of esophagus Continue nifedipine tid and monitor  5. Late onset Alzheimer's disease without behavioral disturbance Supportive care. Continue namenda. Fall precautions, assistance with ADLs.   6. HYPERCHOLESTEROLEMIA LDL at goal. Continue lipitor 10 mg daily. Reviewed lipid panel. LDL at goal.   7. Long term current use of anticoagulant therapy Continue rivaroxaban, monitor cbc periodically. Stable h&h in 3/19    Family/ staff Communication: reviewed care plan with patient and charge nurse.     Labs/tests ordered:  none   Blanchie Serve, MD Internal Medicine [facility] [address] [place], [place] [address] Cell Phone (Monday-Friday 8 am - 5 pm): [phone] On Call: [phone] and follow prompts after 5 pm and on weekends Office Phone: [phone] Office Fax: [phone]

## 2017-07-03 DIAGNOSIS — D473 Essential (hemorrhagic) thrombocythemia: Secondary | ICD-10-CM | POA: Diagnosis not present

## 2017-07-03 LAB — CBC AND DIFFERENTIAL
HCT: 36 (ref 36–46)
Hemoglobin: 12.3 (ref 12.0–16.0)
NEUTROS ABS: 4264
Platelets: 269 (ref 150–399)
WBC: 6.9

## 2017-07-04 ENCOUNTER — Telehealth: Payer: Self-pay | Admitting: *Deleted

## 2017-07-04 ENCOUNTER — Other Ambulatory Visit: Payer: Self-pay | Admitting: *Deleted

## 2017-07-04 NOTE — Telephone Encounter (Signed)
[place] - left message to call me back.

## 2017-07-04 NOTE — Telephone Encounter (Signed)
-----   Message from Annia Belt, MD sent at 07/03/2017  6:16 PM EDT ----- Call nursing facility/pt: labs good; stay on current dose of Hydrea DrG ----- Message ----- From: Ebbie Latus, RN Sent: 07/03/2017   1:36 PM To: Annia Belt, MD  Dr Darnell Level  Received fax from [facility] - lab results; WBC 6.9 ,Hgb 12.3 , Plt 269,  RBC 3.47 , MCV/MCH 103.2/35.4.  I will put fax in your mailbox. Thanks [employer]

## 2017-07-04 NOTE — Telephone Encounter (Signed)
Called / talked to Brenda,LPN at [facility] - informed received lab results, "labs good; stay on current dose of Hydrea" per Dr Jearld Pies. She repeated instruction.

## 2017-07-19 ENCOUNTER — Non-Acute Institutional Stay (SKILLED_NURSING_FACILITY): Payer: Medicare Other | Admitting: Nurse Practitioner

## 2017-07-19 ENCOUNTER — Encounter: Payer: Self-pay | Admitting: Nurse Practitioner

## 2017-07-19 DIAGNOSIS — G8929 Other chronic pain: Secondary | ICD-10-CM | POA: Diagnosis not present

## 2017-07-19 DIAGNOSIS — K220 Achalasia of cardia: Secondary | ICD-10-CM

## 2017-07-19 DIAGNOSIS — I82402 Acute embolism and thrombosis of unspecified deep veins of left lower extremity: Secondary | ICD-10-CM | POA: Diagnosis not present

## 2017-07-19 DIAGNOSIS — I119 Hypertensive heart disease without heart failure: Secondary | ICD-10-CM | POA: Diagnosis not present

## 2017-07-19 DIAGNOSIS — D473 Essential (hemorrhagic) thrombocythemia: Secondary | ICD-10-CM | POA: Diagnosis not present

## 2017-07-19 DIAGNOSIS — G301 Alzheimer's disease with late onset: Secondary | ICD-10-CM | POA: Diagnosis not present

## 2017-07-19 DIAGNOSIS — J4489 Other specified chronic obstructive pulmonary disease: Secondary | ICD-10-CM

## 2017-07-19 DIAGNOSIS — F0280 Dementia in other diseases classified elsewhere without behavioral disturbance: Secondary | ICD-10-CM | POA: Diagnosis not present

## 2017-07-19 DIAGNOSIS — M5440 Lumbago with sciatica, unspecified side: Secondary | ICD-10-CM

## 2017-07-19 DIAGNOSIS — J449 Chronic obstructive pulmonary disease, unspecified: Secondary | ICD-10-CM

## 2017-07-19 DIAGNOSIS — M5441 Lumbago with sciatica, right side: Secondary | ICD-10-CM

## 2017-07-19 NOTE — Progress Notes (Signed)
Location:  [facility] Room Number: 35 Place of Service:  SNF ([phone]) Provider:  Fauna Neuner, ManXie  NP  Blanchie Serve, MD  Patient Care Team: Blanchie Serve, MD as PCP - General (Internal Medicine) Annia Belt, MD as Consulting Physician (Hematology and Oncology) Suella Broad, MD as Consulting Physician (Physical Medicine and Rehabilitation) Noralee Space, MD (Pulmonary Disease) [place] Shivansh Hardaway X, NP as Nurse Practitioner (Nurse Practitioner) Inda Castle, MD (Inactive) as Consulting Physician (Gastroenterology)  Extended Emergency Contact Information Primary Emergency Contact: Iacovelli,Gene Address: [address]          [place], [place] [address] Johnnette Litter of [place] Phone: [phone] Work Phone: [phone] Mobile Phone: [phone] Relation: Son Secondary Emergency Contact: [name], [place] of [place] Phone: [phone] Relation: Son  Code Status:  DNR Goals of care: Advanced Directive information Advanced Directives 07/19/2017  Does Patient Have a Medical Advance Directive? Yes  Type of Paramedic of [place];Living will;Out of facility DNR (pink MOST or yellow form)  Does patient want to make changes to medical advance directive? No - Patient declined  Copy of [place] in Chart? Yes  Pre-existing out of facility DNR order (yellow form or pink MOST form) Yellow form placed in chart (order not valid for inpatient use)     Chief Complaint  Patient presents with  . Medical Management of Chronic Issues    F/U- HTN, Alzheimers,     HPI:  Pt is a 84 y.o. female seen today for medical management of chronic diseases.      The patient has history of dementia, resides in SNF [facility], self transfer, w/c for mobility, on Memantine 10mg  bid for memory. COPD stable on Breo Ellipta daily, prn albuterol HFA 2 puff q6h prn. Lower  back pain is controlled on Norco 10/325mg  tid. Essential thrombocythemia is managed on Dydrea per oncology. Hx of HTN, blood pressure is controlled on Metoprolol 25mg  bid. Hx of DVTs, on chronic Xarelto 15mg  qd. Achalasia of esophagus, s/p of dilatation, on Nifedipine 10mg  tid.   Past Medical History:  Diagnosis Date  . Abnormal weight gain 05/09/2012  . Achalasia of esophagus 09/18/2013   09/18/13 Nifedipine 10mg  tid ac meals. 11/17/14 [facility] f/u as neeed   . Anxiety and depression 03/12/2012   Increased Sertraline to 50mg  daily 07/16/13 08/26/13 Mirtazapine 7.5mg  qhs  10/12/14 Pharm decreased Sertraline to 25mg .  05/04/15 TSH 2.89 09/15/15 pharm taper off Sertraline.  10/08/15 continue Sertraline 25mg  daily, POA desires. 12/02/15 wbc 6.1, Hgb 12.0, plt 271    . Asthmatic bronchitis   . BACK PAIN, LUMBAR 03/12/2007   Hx of lower back pain, had X-ray of her lumbar spine 09/08/12--spondylosis-better controlled with Hydrocodone/ApAp  10/325mg  ac and hs  01/01/13 lumbar spine inj. Able to ambulate with walker on unit until her fall 06/22/13. 06/25/13 unremarkable. X-ray lumbar spine. 11/10/13 dc Oxycodone prn-not used in 60 days.     . COLONIC POLYPS 03/12/2007   Initially noted April 2002 colonoscopy. Followup colonoscopy 05/31/2006 did not disclose any further polyps.    Marland Kitchen COPD (chronic obstructive pulmonary disease) ([place])   . Coronary artery disease   . Deep vein thrombosis of left lower extremity ([place]) 07/19/2012  . Dementia 09/16/2012   05/04/15 TSH 2.89   . Depression   . Diverticulosis 05/10/2012  . DJD (degenerative joint disease)   . Dry  skin dermatitis 11/18/2012  . Edema of leg 07/01/2010  . Epistaxis 01/24/2016   01/21/16: x2, held Xarelto x 1 day, Saline Nasal spray prn, wbc 9.5, Hgb 12.4, Plt 331, Na 138, K 4.5, Bun 25, creat 0.89 01/22/16 HAL93(79-02)  . Esophageal dysmotility 07/26/2013  . Esophageal dysphagia 05/27/2010   New dx of achalasia 6/ 2015 09/18/13 Nifedipine 10mg  tid ac meals.    . Esophageal  stricture   . Essential hypertension, benign 10/30/2013   12/02/15 wbc 6.1, Hgb 12.0, plt 271   . Essential thrombocythemia ([place]) 06/09/2009  . Fall 06/25/2013  . GERD (gastroesophageal reflux disease)   . Hip fracture ([place]) 09/08/2012  . HYPERCHOLESTEROLEMIA 03/12/2007   08/10/15 cholesterol 119, triglycerides 143, HDL 51, LDL 39   . Lumbar back pain   . MACULAR DEGENERATION 01/26/2008   Qualifier: Diagnosis of  By: Lenna Gilford MD, Deborra Medina   . Osteoarthritis 03/12/2007  . Osteoporosis 03/12/2007   Qualifier: Diagnosis of  By: Julien Girt CMA, Marliss Czar  06/15/14 dc Vit D 1000u po daily per pharm.    . Phlebitis and thrombophlebitis of other deep vessels of lower extremities 07/10/2010  . S/P balloon dilatation of esophageal stricture 07/28/2013   07/26/13. Dr. Henrene Pastor. EGD foreign removal and Balloon dilation of esophagus. Esophageal dysmotility, suspected achalasia, dilation at the GE junction to 68mm. Candida esophagitis.    . SYNCOPE 07/30/2009   Occurred 2011. Hospitalized. Felt due to dehydration.    . TRANSIENT ISCHEMIC ATTACKS, HX OF 03/12/2007   Qualifier: Diagnosis of  By: Julien Girt CMA, Marliss Czar     Past Surgical History:  Procedure Laterality Date  . ABDOMINAL HYSTERECTOMY     and bso  . APPENDECTOMY    . BOTOX INJECTION N/A 09/18/2013   Procedure: BOTOX INJECTION;  Surgeon: Lafayette Dragon, MD;  Location: [facility];  Service: Endoscopy;  Laterality: N/A;  . BOTOX INJECTION N/A 10/16/2014   Procedure: BOTOX INJECTION;  Surgeon: Inda Castle, MD;  Location: [facility];  Service: Endoscopy;  Laterality: N/A;  . BOTOX INJECTION N/A 05/18/2016   Procedure: BOTOX INJECTION;  Surgeon: Milus Banister, MD;  Location: [facility];  Service: Endoscopy;  Laterality: N/A;  . CATARACT EXTRACTION W/ INTRAOCULAR LENS  IMPLANT, BILATERAL    . CHOLECYSTECTOMY    . ESOPHAGEAL MANOMETRY N/A 08/25/2013   Procedure: ESOPHAGEAL MANOMETRY (EM);  Surgeon: Jerene Bears, MD;  Location: [facility];  Service: Gastroenterology;   Laterality: N/A;  . ESOPHAGOGASTRODUODENOSCOPY N/A 07/26/2013   Procedure: ESOPHAGOGASTRODUODENOSCOPY (EGD);  Surgeon: Irene Shipper, MD;  [facility] ENDOSCOPY;  Service: Endoscopy;  Laterality: N/A;  . ESOPHAGOGASTRODUODENOSCOPY N/A 10/16/2014   Procedure: ESOPHAGOGASTRODUODENOSCOPY (EGD);  Surgeon: Inda Castle, MD;  Location: [facility];  Service: Endoscopy;  Laterality: N/A;  with botox  . ESOPHAGOGASTRODUODENOSCOPY (EGD) WITH ESOPHAGEAL DILATION N/A 11/25/2012   Procedure: ESOPHAGOGASTRODUODENOSCOPY (EGD) WITH ESOPHAGEAL DILATION;  Surgeon: Jerene Bears, MD;  Location: [facility];  Service: Gastroenterology;  Laterality: N/A;  . ESOPHAGOGASTRODUODENOSCOPY (EGD) WITH PROPOFOL N/A 09/18/2013   Procedure: ESOPHAGOGASTRODUODENOSCOPY (EGD) WITH PROPOFOL;  Surgeon: Lafayette Dragon, MD;  Location: [facility];  Service: Endoscopy;  Laterality: N/A;  . ESOPHAGOGASTRODUODENOSCOPY (EGD) WITH PROPOFOL N/A 05/18/2016   Procedure: ESOPHAGOGASTRODUODENOSCOPY (EGD) WITH PROPOFOL;  Surgeon: Milus Banister, MD;  Location: [facility];  Service: Endoscopy;  Laterality: N/A;  . HIP ARTHROPLASTY Right 09/09/2012   Procedure: ARTHROPLASTY BIPOLAR HIP;  Surgeon: Mauri Pole, MD;  Location: [facility];  Service: Orthopedics;  Laterality: Right;  . TAH and  BSO  08/21/1995    Allergies  Allergen Reactions  . Sulfamethoxazole Rash    Outpatient Encounter Medications as of 07/19/2017  Medication Sig  . acetaminophen (TYLENOL) 325 MG tablet Take 650 mg by mouth every 4 (four) hours as needed for moderate pain.   Marland Kitchen albuterol (PROVENTIL HFA;VENTOLIN HFA) 108 (90 BASE) MCG/ACT inhaler Inhale 2 puffs into the lungs every 6 (six) hours as needed for wheezing or shortness of breath.  Marland Kitchen atorvastatin (LIPITOR) 10 MG tablet Take 10 mg by mouth daily.  . CHLORHEXIDINE GLUCONATE, BULK, SOLN Take 5 mLs by mouth daily. 5 ml swish and spit for 30 seconds every day indefinitely   . cholecalciferol (VITAMIN D) 1000 units tablet  Take 1,000 Units by mouth daily.  . feeding supplement (RESOURCE BREEZE) LIQD Take 90 mLs by mouth 2 (two) times daily.   . fluticasone furoate-vilanterol (BREO ELLIPTA) 100-25 MCG/INH AEPB Inhale 1 puff into the lungs daily. One puff inhale once a day, rinse mouth after each use.   . Humidifier MISC Please use humidifier to prevent excessive dryness.  Marland Kitchen HYDROcodone-acetaminophen (NORCO) 10-325 MG per tablet Take 1 tablet by mouth 3 (three) times daily.   . hydroxyurea (HYDREA) 500 MG capsule Take 500-1,000 mg by mouth See admin instructions. Takes 1000 mg on Monday only Takes 500 mg on Sunday, Wednesday and Friday only  . memantine (NAMENDA) 10 MG tablet Take 10 mg by mouth 2 (two) times daily.   . metoprolol tartrate (LOPRESSOR) 25 MG tablet Take 25 mg by mouth 2 (two) times daily.  . montelukast (SINGULAIR) 10 MG tablet Take 10 mg by mouth at bedtime.  . Multiple Vitamins-Minerals (PRESERVISION AREDS 2) CAPS Take 1 capsule by mouth 2 (two) times daily.  Marland Kitchen NIFEdipine (PROCARDIA) 10 MG capsule Take 10 mg by mouth 3 (three) times daily. Squeeze contents of one capsule uner tongue 15-30 minutes before meals 3 times a day   . nitroGLYCERIN (NITROSTAT) 0.4 MG SL tablet Place 0.4 mg under the tongue every 5 (five) minutes as needed for chest pain.  Marland Kitchen omeprazole (PRILOSEC) 20 MG capsule Take 20 mg by mouth at bedtime.   . Rivaroxaban (XARELTO) 15 MG TABS tablet Take 15 mg by mouth every evening. 5pm  . sodium fluoride (DENTA 5000 PLUS) 1.1 % CREA dental cream Place 1 application onto teeth at bedtime.   No facility-administered encounter medications on file as of 07/19/2017.    ROS was provided with assistance of staff Review of Systems  Constitutional: Negative for activity change, appetite change, chills, diaphoresis, fatigue and fever.  HENT: Positive for hearing loss and trouble swallowing. Negative for congestion and voice change.   Respiratory: Positive for cough and shortness of breath.  Negative for wheezing.        DOE. Occasionally hacking cough.   Cardiovascular: Negative for chest pain, palpitations and leg swelling.  Gastrointestinal: Negative for abdominal distention, abdominal pain, constipation, diarrhea, nausea and vomiting.  Genitourinary: Negative for difficulty urinating, dysuria and urgency.  Musculoskeletal: Positive for back pain and gait problem.  Skin: Negative for color change and pallor.  Neurological: Negative for dizziness, tremors, speech difficulty and headaches.       Dementia  Psychiatric/Behavioral: Positive for confusion. Negative for agitation, behavioral problems, hallucinations and sleep disturbance. The patient is not nervous/anxious.     Immunization History  Administered Date(s) Administered  . Influenza Split 11/16/2010, 12/13/2011  . Influenza Whole 11/27/2009  . Influenza-Unspecified 12/31/2013, 11/18/2014, 12/14/2015, 12/11/2016  . PPD Test 09/11/2012  .  Pneumococcal Polysaccharide-23 02/28/1988   Pertinent  Health Maintenance Due  Topic Date Due  . PNA vac Low Risk Adult (2 of 2 - PCV13) 02/27/1989  . INFLUENZA VACCINE  09/27/2017  . DEXA SCAN  Completed   Fall Risk  02/08/2017 02/08/2017 10/04/2015 12/28/2014 10/23/2014  Falls in the past year? No Exclusion - non ambulatory No Yes Yes  Number falls in past yr: - - - 1 1  Injury with Fall? - - - No No  Risk for fall due to : - Impaired balance/gait;Impaired vision Impaired mobility;Impaired balance/gait History of fall(s);Impaired balance/gait;Impaired mobility Impaired mobility  Follow up - - - Falls prevention discussed Falls evaluation completed   Functional Status Survey:    Vitals:   07/19/17 0912  BP: 140/76  Pulse: 74  Resp: 16  Temp: 98.5 F (36.9 C)  Weight: 99 lb (44.9 kg)  Height: 5\' 1"  (1.549 m)   Body mass index is 18.71 kg/m. Physical Exam  Constitutional: She appears well-developed and well-nourished.  HENT:  Head: Normocephalic and atraumatic.    Eyes: Pupils are equal, round, and reactive to light. EOM are normal.  Neck: Normal range of motion. Neck supple. No JVD present. No thyromegaly present.  Cardiovascular: Normal rate and regular rhythm.  No murmur heard. Pulmonary/Chest: She has no wheezes. She has no rales.  Decreased air entry  Abdominal: Soft. Bowel sounds are normal. She exhibits no distension. There is no tenderness.  Musculoskeletal: She exhibits tenderness. She exhibits no edema.  Self transfer, w/c to get around. Kyphoscoliosis. Chronic lower back pain is reasonably controlled.   Neurological: She is alert. No cranial nerve deficit. She exhibits normal muscle tone. Coordination normal.  Oriented to person and her room on unit.   Skin: Skin is warm and dry.  Psychiatric: She has a normal mood and affect. Her behavior is normal.    Labs reviewed: Recent Labs    01/30/17  NA 139  K 4.3  BUN 19  CREATININE 0.8   Recent Labs    01/30/17  AST 17  ALT 11  ALKPHOS 68   Recent Labs    01/02/17 05/01/17 07/03/17  WBC 6.2  6.2 6.3 6.9  NEUTROABS  --   --  4,264  HGB 12.7  12.7 12.7 12.3  HCT 36  36 37 36  PLT 272 326 269   Lab Results  Component Value Date   TSH 2.00 09/21/2016   Lab Results  Component Value Date   HGBA1C 5.5 10/16/2014   Lab Results  Component Value Date   CHOL 137 03/01/2017   HDL 46 03/01/2017   LDLCALC 70 03/01/2017   LDLDIRECT 152.3 06/04/2009   TRIG 133 03/01/2017   CHOLHDL 3 09/13/2011    Significant Diagnostic Results in last 30 days:  No results found.  Assessment/Plan Achalasia of esophagus Achalasia of esophagus, s/p of dilatation, continue Nifedipine 10mg  tid.    Deep vein thrombosis of left lower extremity Hx of DVTs, continue chronic Xarelto 15mg  qd.   Hypertensive heart disease Hx of HTN, blood pressure is controlled, continue Metoprolol 25mg  bid.   Essential thrombocythemia ([place]) Continue Hydrea, last plt 269 07/03/17  BACK PAIN, LUMBAR Lower  back pain is controlled, continue Norco 10/325mg  tid.  COPD with asthma ([place])  COPD stable, continue Breo Ellipta daily, prn albuterol HFA 2 puff q6h prn.   Alzheimer's dementia without behavioral disturbance dementia, resides in SNF [facility], self transfer, w/c for mobility, continue Memantine 10mg  bid for memory.  Family/ staff Communication: plan of care reviewed with the patient and charge nurse.   Labs/tests ordered:  none  Time spend 25 minutes.

## 2017-07-19 NOTE — Assessment & Plan Note (Signed)
Hx of HTN, blood pressure is controlled, continue  Metoprolol 25mg bid.  

## 2017-07-19 NOTE — Assessment & Plan Note (Signed)
Hx of DVTs, continue chronic Xarelto 15mg  qd.

## 2017-07-19 NOTE — Assessment & Plan Note (Signed)
COPD stable, continue Breo Ellipta daily, prn albuterol HFA 2 puff q6h prn.

## 2017-07-19 NOTE — Assessment & Plan Note (Signed)
Lower back pain is controlled, continue Norco 10/325mg  tid.

## 2017-07-19 NOTE — Assessment & Plan Note (Signed)
dementia, resides in SNF [facility], self transfer, w/c for mobility, continue Memantine 10mg  bid for memory.

## 2017-07-19 NOTE — Assessment & Plan Note (Signed)
Achalasia of esophagus, s/p of dilatation, continue Nifedipine 10mg  tid.

## 2017-07-19 NOTE — Assessment & Plan Note (Signed)
Continue Hydrea, last plt 269 07/03/17

## 2017-07-20 ENCOUNTER — Encounter: Payer: Self-pay | Admitting: Nurse Practitioner

## 2017-08-16 ENCOUNTER — Encounter: Payer: Self-pay | Admitting: Nurse Practitioner

## 2017-08-16 ENCOUNTER — Non-Acute Institutional Stay (SKILLED_NURSING_FACILITY): Payer: Medicare Other | Admitting: Nurse Practitioner

## 2017-08-16 DIAGNOSIS — J449 Chronic obstructive pulmonary disease, unspecified: Secondary | ICD-10-CM | POA: Diagnosis not present

## 2017-08-16 DIAGNOSIS — G301 Alzheimer's disease with late onset: Secondary | ICD-10-CM

## 2017-08-16 DIAGNOSIS — D473 Essential (hemorrhagic) thrombocythemia: Secondary | ICD-10-CM

## 2017-08-16 DIAGNOSIS — K219 Gastro-esophageal reflux disease without esophagitis: Secondary | ICD-10-CM | POA: Diagnosis not present

## 2017-08-16 DIAGNOSIS — G8929 Other chronic pain: Secondary | ICD-10-CM

## 2017-08-16 DIAGNOSIS — F0280 Dementia in other diseases classified elsewhere without behavioral disturbance: Secondary | ICD-10-CM

## 2017-08-16 DIAGNOSIS — M5440 Lumbago with sciatica, unspecified side: Secondary | ICD-10-CM | POA: Diagnosis not present

## 2017-08-16 DIAGNOSIS — I10 Essential (primary) hypertension: Secondary | ICD-10-CM

## 2017-08-16 DIAGNOSIS — Z7901 Long term (current) use of anticoagulants: Secondary | ICD-10-CM

## 2017-08-16 NOTE — Progress Notes (Signed)
Location:  [place] Room Number: 35 Place of Service:  SNF ([phone]) Provider:  Tashina Credit, ManXie  NP  Blanchie Serve, MD  Patient Care Team: Blanchie Serve, MD as PCP - General (Internal Medicine) Annia Belt, MD as Consulting Physician (Hematology and Oncology) Suella Broad, MD as Consulting Physician (Physical Medicine and Rehabilitation) Noralee Space, MD (Pulmonary Disease) [place] Namon Villarin X, NP as Nurse Practitioner (Nurse Practitioner) Inda Castle, MD (Inactive) as Consulting Physician (Gastroenterology)  Extended Emergency Contact Information Primary Emergency Contact: Watton,Gene Address: [address]          [place], [place] [address] Johnnette Litter of [place] Phone: [phone] Work Phone: [phone] Mobile Phone: [phone] Relation: Son Secondary Emergency Contact: [name], [place] of [place] Phone: [phone] Relation: Son  Code Status:  DNR Goals of care: Advanced Directive information Advanced Directives 08/16/2017  Does Patient Have a Medical Advance Directive? Yes  Type of Paramedic of [place];Living will;Out of facility DNR (pink MOST or yellow form)  Does patient want to make changes to medical advance directive? No - Patient declined  Copy of [place] in Chart? Yes  Pre-existing out of facility DNR order (yellow form or pink MOST form) Yellow form placed in chart (order not valid for inpatient use)     Chief Complaint  Patient presents with  . Medical Management of Chronic Issues    f/u chronic back pain, Alzheimers disease, GERD, COPD    HPI:  Pt is a 84 y.o. female seen today for medical management of chronic diseases.     The patient has chronic lower back pain, managed with Norco bid, w/c for mobility. Her memory is preserved on Memantine, she is functioning well in SNF [facility], she transfers  self and toileting with minimal assistance. GERD stable on Omeprazole 20mg , Nifedipine 10mg  tid. COPD/ Asthma is stable on Altuerol HFA 2 puffs q6 prn, Breo Ellipta daily, Singulair daily.Marland Kitchen Hx of essential thrombocythemia, last plt 269 07/03/17, on Hydrea. Hx of HTN, blood pressure is controlled on Metoprolol 25mg  bid. Hx of DVTS, chronic Xarelto 15mg  qd.  Past Medical History:  Diagnosis Date  . Abnormal weight gain 05/09/2012  . Achalasia of esophagus 09/18/2013   09/18/13 Nifedipine 10mg  tid ac meals. 11/17/14 [facility] f/u as neeed   . Anxiety and depression 03/12/2012   Increased Sertraline to 50mg  daily 07/16/13 08/26/13 Mirtazapine 7.5mg  qhs  10/12/14 Pharm decreased Sertraline to 25mg .  05/04/15 TSH 2.89 09/15/15 pharm taper off Sertraline.  10/08/15 continue Sertraline 25mg  daily, POA desires. 12/02/15 wbc 6.1, Hgb 12.0, plt 271    . Asthmatic bronchitis   . BACK PAIN, LUMBAR 03/12/2007   Hx of lower back pain, had X-ray of her lumbar spine 09/08/12--spondylosis-better controlled with Hydrocodone/ApAp  10/325mg  ac and hs  01/01/13 lumbar spine inj. Able to ambulate with walker on unit until her fall 06/22/13. 06/25/13 unremarkable. X-ray lumbar spine. 11/10/13 dc Oxycodone prn-not used in 60 days.     . COLONIC POLYPS 03/12/2007   Initially noted April 2002 colonoscopy. Followup colonoscopy 05/31/2006 did not disclose any further polyps.    Marland Kitchen COPD (chronic obstructive pulmonary disease) ([place])   . Coronary artery disease   . Deep vein thrombosis of left lower extremity ([place]) 07/19/2012  . Dementia 09/16/2012   05/04/15 TSH 2.89   . Depression   . Diverticulosis 05/10/2012  .  DJD (degenerative joint disease)   . Dry skin dermatitis 11/18/2012  . Edema of leg 07/01/2010  . Epistaxis 01/24/2016   01/21/16: x2, held Xarelto x 1 day, Saline Nasal spray prn, wbc 9.5, Hgb 12.4, Plt 331, Na 138, K 4.5, Bun 25, creat 0.89 01/22/16 BJS28(31-51)  . Esophageal dysmotility 07/26/2013  . Esophageal dysphagia 05/27/2010   New dx  of achalasia 6/ 2015 09/18/13 Nifedipine 10mg  tid ac meals.    . Esophageal stricture   . Essential hypertension, benign 10/30/2013   12/02/15 wbc 6.1, Hgb 12.0, plt 271   . Essential thrombocythemia ([place]) 06/09/2009  . Fall 06/25/2013  . GERD (gastroesophageal reflux disease)   . Hip fracture ([place]) 09/08/2012  . HYPERCHOLESTEROLEMIA 03/12/2007   08/10/15 cholesterol 119, triglycerides 143, HDL 51, LDL 39   . Lumbar back pain   . MACULAR DEGENERATION 01/26/2008   Qualifier: Diagnosis of  By: Lenna Gilford MD, Deborra Medina   . Osteoarthritis 03/12/2007  . Osteoporosis 03/12/2007   Qualifier: Diagnosis of  By: Julien Girt CMA, Marliss Czar  06/15/14 dc Vit D 1000u po daily per pharm.    . Phlebitis and thrombophlebitis of other deep vessels of lower extremities 07/10/2010  . S/P balloon dilatation of esophageal stricture 07/28/2013   07/26/13. Dr. Henrene Pastor. EGD foreign removal and Balloon dilation of esophagus. Esophageal dysmotility, suspected achalasia, dilation at the GE junction to 68mm. Candida esophagitis.    . SYNCOPE 07/30/2009   Occurred 2011. Hospitalized. Felt due to dehydration.    . TRANSIENT ISCHEMIC ATTACKS, HX OF 03/12/2007   Qualifier: Diagnosis of  By: Julien Girt CMA, Marliss Czar     Past Surgical History:  Procedure Laterality Date  . ABDOMINAL HYSTERECTOMY     and bso  . APPENDECTOMY    . BOTOX INJECTION N/A 09/18/2013   Procedure: BOTOX INJECTION;  Surgeon: Lafayette Dragon, MD;  Location: [facility] ENDOSCOPY;  Service: Endoscopy;  Laterality: N/A;  . BOTOX INJECTION N/A 10/16/2014   Procedure: BOTOX INJECTION;  Surgeon: Inda Castle, MD;  Location: [facility];  Service: Endoscopy;  Laterality: N/A;  . BOTOX INJECTION N/A 05/18/2016   Procedure: BOTOX INJECTION;  Surgeon: Milus Banister, MD;  Location: [facility]OS[facility];  Service: Endoscopy;  Laterality: N/A;  . CATARACT EXTRACTION W/ INTRAOCULAR LENS  IMPLANT, BILATERAL    . CHOLECYSTECTOMY    . ESOPHAGEAL MANOMETRY N/A 08/25/2013   Procedure: ESOPHAGEAL MANOMETRY (EM);   Surgeon: Jerene Bears, MD;  Location: [facility];  Service: Gastroenterology;  Laterality: N/A;  . ESOPHAGOGASTRODUODENOSCOPY N/A 07/26/2013   Procedure: ESOPHAGOGASTRODUODENOSCOPY (EGD);  Surgeon: Irene Shipper, MD;  Location: [facility] ENDOSCOPY;  Service: Endoscopy;  Laterality: N/A;  . ESOPHAGOGASTRODUODENOSCOPY N/A 10/16/2014   Procedure: ESOPHAGOGASTRODUODENOSCOPY (EGD);  Surgeon: Inda Castle, MD;  Location: [facility];  Service: Endoscopy;  Laterality: N/A;  with botox  . ESOPHAGOGASTRODUODENOSCOPY (EGD) WITH ESOPHAGEAL DILATION N/A 11/25/2012   Procedure: ESOPHAGOGASTRODUODENOSCOPY (EGD) WITH ESOPHAGEAL DILATION;  Surgeon: Jerene Bears, MD;  [facility];  Service: Gastroenterology;  Laterality: N/A;  . ESOPHAGOGASTRODUODENOSCOPY (EGD) WITH PROPOFOL N/A 09/18/2013   Procedure: ESOPHAGOGASTRODUODENOSCOPY (EGD) WITH PROPOFOL;  Surgeon: Lafayette Dragon, MD;  Location: [facility];  Service: Endoscopy;  Laterality: N/A;  . ESOPHAGOGASTRODUODENOSCOPY (EGD) WITH PROPOFOL N/A 05/18/2016   Procedure: ESOPHAGOGASTRODUODENOSCOPY (EGD) WITH PROPOFOL;  Surgeon: Milus Banister, MD;  Location: [facility];  Service: Endoscopy;  Laterality: N/A;  . HIP ARTHROPLASTY Right 09/09/2012   Procedure: ARTHROPLASTY BIPOLAR HIP;  Surgeon: Mauri Pole, MD;  Location: [facility]S;  Service:  Orthopedics;  Laterality: Right;  . TAH and BSO  08/21/1995    Allergies  Allergen Reactions  . Sulfamethoxazole Rash    Outpatient Encounter Medications as of 08/16/2017  Medication Sig  . acetaminophen (TYLENOL) 325 MG tablet Take 650 mg by mouth every 4 (four) hours as needed for moderate pain.   Marland Kitchen albuterol (PROVENTIL HFA;VENTOLIN HFA) 108 (90 BASE) MCG/ACT inhaler Inhale 2 puffs into the lungs every 6 (six) hours as needed for wheezing or shortness of breath.  Marland Kitchen atorvastatin (LIPITOR) 10 MG tablet Take 10 mg by mouth daily.  . CHLORHEXIDINE GLUCONATE, BULK, SOLN Take 5 mLs by mouth daily. 5 ml swish and spit for 30  seconds every day indefinitely   . cholecalciferol (VITAMIN D) 1000 units tablet Take 1,000 Units by mouth daily.  . feeding supplement (RESOURCE BREEZE) LIQD Take 90 mLs by mouth 2 (two) times daily.   . fluticasone furoate-vilanterol (BREO ELLIPTA) 100-25 MCG/INH AEPB Inhale 1 puff into the lungs daily. One puff inhale once a day, rinse mouth after each use.   . Humidifier MISC Please use humidifier to prevent excessive dryness.  Marland Kitchen HYDROcodone-acetaminophen (NORCO) 10-325 MG per tablet Take 1 tablet by mouth 3 (three) times daily.   . hydroxyurea (HYDREA) 500 MG capsule Take 500-1,000 mg by mouth See admin instructions. Takes 1000 mg on Monday only Takes 500 mg on Sunday, Wednesday and Friday only  . memantine (NAMENDA) 10 MG tablet Take 10 mg by mouth 2 (two) times daily.   . metoprolol tartrate (LOPRESSOR) 25 MG tablet Take 25 mg by mouth 2 (two) times daily.  . montelukast (SINGULAIR) 10 MG tablet Take 10 mg by mouth at bedtime.  . Multiple Vitamins-Minerals (PRESERVISION AREDS 2) CAPS Take 1 capsule by mouth 2 (two) times daily.  Marland Kitchen NIFEdipine (PROCARDIA) 10 MG capsule Take 10 mg by mouth 3 (three) times daily. Squeeze contents of one capsule uner tongue 15-30 minutes before meals 3 times a day   . nitroGLYCERIN (NITROSTAT) 0.4 MG SL tablet Place 0.4 mg under the tongue every 5 (five) minutes as needed for chest pain.  Marland Kitchen omeprazole (PRILOSEC) 20 MG capsule Take 20 mg by mouth at bedtime.   . Rivaroxaban (XARELTO) 15 MG TABS tablet Take 15 mg by mouth every evening. 5pm  . sodium fluoride (DENTA 5000 PLUS) 1.1 % CREA dental cream Place 1 application onto teeth at bedtime.   No facility-administered encounter medications on file as of 08/16/2017.    ROS was provided with assistance of staff Review of Systems  Constitutional: Negative for activity change, appetite change, chills, diaphoresis, fatigue and fever.  HENT: Positive for hearing loss and trouble swallowing. Negative for congestion  and voice change.   Respiratory: Positive for cough and shortness of breath. Negative for chest tightness and wheezing.   Cardiovascular: Negative for chest pain, palpitations and leg swelling.  Gastrointestinal: Negative for abdominal distention, abdominal pain, constipation, diarrhea, nausea and vomiting.  Genitourinary: Negative for difficulty urinating, dysuria and urgency.  Musculoskeletal: Positive for back pain and gait problem.  Skin: Negative for color change and pallor.  Neurological: Negative for dizziness, speech difficulty, weakness and headaches.       Dementia  Psychiatric/Behavioral: Negative for agitation, behavioral problems, hallucinations and sleep disturbance.    Immunization History  Administered Date(s) Administered  . Influenza Split 11/16/2010, 12/13/2011  . Influenza Whole 11/27/2009  . Influenza-Unspecified 12/31/2013, 11/18/2014, 12/14/2015, 12/11/2016  . PPD Test 09/11/2012  . Pneumococcal Polysaccharide-23 02/28/1988   Pertinent  Health  Maintenance Due  Topic Date Due  . PNA vac Low Risk Adult (2 of 2 - PCV13) 02/27/1989  . INFLUENZA VACCINE  09/27/2017  . DEXA SCAN  Completed   Fall Risk  02/08/2017 02/08/2017 10/04/2015 12/28/2014 10/23/2014  Falls in the past year? No Exclusion - non ambulatory No Yes Yes  Number falls in past yr: - - - 1 1  Injury with Fall? - - - No No  Risk for fall due to : - Impaired balance/gait;Impaired vision Impaired mobility;Impaired balance/gait History of fall(s);Impaired balance/gait;Impaired mobility Impaired mobility  Follow up - - - Falls prevention discussed Falls evaluation completed   Functional Status Survey:    Vitals:   08/16/17 1450  BP: (!) 168/70  Pulse: 68  Resp: 20  Temp: 99.3 F (37.4 C)  Weight: 98 lb 8 oz (44.7 kg)  Height: 5\' 1"  (1.549 m)   Body mass index is 18.61 kg/m. Physical Exam  Constitutional: She appears well-developed and well-nourished.  HENT:  Head: Normocephalic and  atraumatic.  Eyes: Pupils are equal, round, and reactive to light. EOM are normal.  Neck: Normal range of motion. Neck supple. No JVD present. No thyromegaly present.  Cardiovascular: Normal rate and regular rhythm.  No murmur heard. Pulmonary/Chest: She has no wheezes. She has no rales.  Decreased air entry.   Abdominal: Soft. Bowel sounds are normal. She exhibits no distension. There is no tenderness.  Musculoskeletal: She exhibits no edema.  Chronic lower back pain, minimal assistance with transfer, self propels w/c to get around. kyphoscoliosis   Neurological: She is alert. No cranial nerve deficit. She exhibits normal muscle tone. Coordination normal.  Oriented to person and place  Skin: Skin is warm and dry.  Psychiatric: She has a normal mood and affect. Her behavior is normal.    Labs reviewed: Recent Labs    01/30/17  NA 139  K 4.3  BUN 19  CREATININE 0.8   Recent Labs    01/30/17  AST 17  ALT 11  ALKPHOS 68   Recent Labs    01/02/17 05/01/17 07/03/17  WBC 6.2  6.2 6.3 6.9  NEUTROABS  --   --  4,264  HGB 12.7  12.7 12.7 12.3  HCT 36  36 37 36  PLT 272 326 269   Lab Results  Component Value Date   TSH 2.00 09/21/2016   Lab Results  Component Value Date   HGBA1C 5.5 10/16/2014   Lab Results  Component Value Date   CHOL 137 03/01/2017   HDL 46 03/01/2017   LDLCALC 70 03/01/2017   LDLDIRECT 152.3 06/04/2009   TRIG 133 03/01/2017   CHOLHDL 3 09/13/2011    Significant Diagnostic Results in last 30 days:  No results found.  Assessment/Plan BACK PAIN, LUMBAR chronic lower back pain, managed, continue  Norco bid, w/c for mobility.   Alzheimer's dementia without behavioral disturbance Her memory is preserved, continue Memantine, she is functioning well in SNF [facility], she transfers self and toileting with minimal assistance.   GERD Hx of achalasia of esophagus, stable, continue Omeprazole 20mg , Nifedipine 10mg  tid.   COPD with asthma ([place]) Stable,  continue Altuerol HFA 2 puffs q6 prn, Breo Ellipta daily, Singulair daily..   Essential thrombocythemia ([place]) Hx of essential thrombocythemia, last plt 269 07/03/17, continue Hydrea.   Essential hypertension, benign Hx of HTN, blood pressure is controlled, continue  Metoprolol 25mg  bid.   Long term current use of anticoagulant therapy Hx of DVTS, chronic Xarelto 15mg  qd  Family/ staff Communication: plan of care reviewed with the patient and charge nurse.   Labs/tests ordered: none  Time spend 25 minutes.

## 2017-08-16 NOTE — Assessment & Plan Note (Signed)
chronic lower back pain, managed, continue  Norco bid, w/c for mobility.

## 2017-08-16 NOTE — Assessment & Plan Note (Signed)
Hx of HTN, blood pressure is controlled, continue  Metoprolol 25mg  bid.

## 2017-08-16 NOTE — Assessment & Plan Note (Signed)
Hx of DVTS, chronic Xarelto 15mg  qd

## 2017-08-16 NOTE — Assessment & Plan Note (Signed)
Hx of essential thrombocythemia, last plt 269 07/03/17, continue Hydrea.

## 2017-08-16 NOTE — Assessment & Plan Note (Signed)
Her memory is preserved, continue Memantine, she is functioning well in SNF [facility], she transfers self and toileting with minimal assistance.

## 2017-08-16 NOTE — Assessment & Plan Note (Signed)
Stable, continue Altuerol HFA 2 puffs q6 prn, Breo Ellipta daily, Singulair daily.Marland Kitchen

## 2017-08-16 NOTE — Assessment & Plan Note (Signed)
Hx of achalasia of esophagus, stable, continue Omeprazole 20mg , Nifedipine 10mg  tid.

## 2017-08-20 ENCOUNTER — Encounter: Payer: Self-pay | Admitting: Nurse Practitioner

## 2017-08-28 DIAGNOSIS — D649 Anemia, unspecified: Secondary | ICD-10-CM | POA: Diagnosis not present

## 2017-08-28 DIAGNOSIS — I82402 Acute embolism and thrombosis of unspecified deep veins of left lower extremity: Secondary | ICD-10-CM | POA: Diagnosis not present

## 2017-08-28 LAB — CBC AND DIFFERENTIAL
HEMATOCRIT: 38 (ref 36–46)
Hemoglobin: 12.7 (ref 12.0–16.0)
PLATELETS: 318 (ref 150–399)
WBC: 6.7

## 2017-08-29 ENCOUNTER — Other Ambulatory Visit: Payer: Self-pay | Admitting: *Deleted

## 2017-08-29 ENCOUNTER — Telehealth: Payer: Self-pay | Admitting: *Deleted

## 2017-08-29 NOTE — Telephone Encounter (Addendum)
Dr Beryle Beams had received lab results from [facility] - stated pt to stay on same dose of Hydrea. [place] - had to leave message for a return call.   WBC 6.7 Plt 318 Hgb 12.7. Results to be scanned.

## 2017-08-29 NOTE — Telephone Encounter (Signed)
Talked to Tywana B LPN at [facility] - order given to continue Hydrea per Dr Darnell Level. Order repeated back.

## 2017-09-14 ENCOUNTER — Non-Acute Institutional Stay (SKILLED_NURSING_FACILITY): Payer: Medicare Other | Admitting: Internal Medicine

## 2017-09-14 ENCOUNTER — Encounter: Payer: Self-pay | Admitting: Internal Medicine

## 2017-09-14 DIAGNOSIS — I119 Hypertensive heart disease without heart failure: Secondary | ICD-10-CM | POA: Diagnosis not present

## 2017-09-14 DIAGNOSIS — K224 Dyskinesia of esophagus: Secondary | ICD-10-CM | POA: Diagnosis not present

## 2017-09-14 DIAGNOSIS — E7800 Pure hypercholesterolemia, unspecified: Secondary | ICD-10-CM

## 2017-09-14 DIAGNOSIS — R2681 Unsteadiness on feet: Secondary | ICD-10-CM

## 2017-09-14 NOTE — Progress Notes (Signed)
Location:  [facility] Room Number: 35 Place of Service:  SNF [phone]) Provider:  Blanchie Serve MD  Blanchie Serve, MD  Patient Care Team: Blanchie Serve, MD as PCP - General (Internal Medicine) Annia Belt, MD as Consulting Physician (Hematology and Oncology) Suella Broad, MD as Consulting Physician (Physical Medicine and Rehabilitation) Noralee Space, MD (Pulmonary Disease) [name], [name], NP as Nurse Practitioner (Nurse Practitioner) Inda Castle, MD (Inactive) as Consulting Physician (Gastroenterology)  Extended Emergency Contact Information Primary Emergency Contact: Sellman,Gene Address: [address]          [place], [place] [address] Johnnette Litter of [place] Phone: [phone] Work Phone: [phone] Mobile Phone: [phone] Relation: Son Secondary Emergency Contact: [name], [place] of [place] Phone: [phone] Relation: Son  Code Status:  DNR  Goals of care: Advanced Directive information Advanced Directives 09/14/2017  Does Patient Have a Medical Advance Directive? Yes  Type of Paramedic of [place];Living will;Out of facility DNR (pink MOST or yellow form)  Does patient want to make changes to medical advance directive? No - Patient declined  Copy of [place] in Chart? Yes  Pre-existing out of facility DNR order (yellow form or pink MOST form) Yellow form placed in chart (order not valid for inpatient use)     Chief Complaint  Patient presents with  . Medical Management of Chronic Issues    Routine Visit     HPI:  Pt is a 84 y.o. female seen today for medical management of chronic diseases. She is pleasantly confused. She denies any acute health issue today. She is seen in her room.  Unsteady gait- ambulates with her wheelchair. No fall reported. Takes tylenol as needed for joint pain. Has Chronic  back pain and takes norco 10-325 mg bid. has upcoming f/u with orthopedic for injection to her back  Essential hypertension- stable BP, takes metoprolol tartrate 25 mg bid  GERD- denies reflux symptom, tolerating omeprazole 20 mg qhs well. Has esophageal dysmotility, gets routine botox injection and takes nifedipine.   Hyperlipidemia- currently on lipitor 10 mg daily. Denies muscle aches.    Past Medical History:  Diagnosis Date  . Abnormal weight gain 05/09/2012  . Achalasia of esophagus 09/18/2013   09/18/13 Nifedipine 10mg  tid ac meals. 11/17/14 [facility] f/u as neeed   . Anxiety and depression 03/12/2012   Increased Sertraline to 50mg  daily 07/16/13 08/26/13 Mirtazapine 7.5mg  qhs  10/12/14 Pharm decreased Sertraline to 25mg .  05/04/15 TSH 2.89 09/15/15 pharm taper off Sertraline.  10/08/15 continue Sertraline 25mg  daily, POA desires. 12/02/15 wbc 6.1, Hgb 12.0, plt 271    . Asthmatic bronchitis   . BACK PAIN, LUMBAR 03/12/2007   Hx of lower back pain, had X-ray of her lumbar spine 09/08/12--spondylosis-better controlled with Hydrocodone/ApAp  10/325mg  ac and hs  01/01/13 lumbar spine inj. Able to ambulate with walker on unit until her fall 06/22/13. 06/25/13 unremarkable. X-ray lumbar spine. 11/10/13 dc Oxycodone prn-not used in 60 days.     . COLONIC POLYPS 03/12/2007   Initially noted April 2002 colonoscopy. Followup colonoscopy 05/31/2006 did not disclose any further polyps.    Marland Kitchen COPD (chronic obstructive pulmonary disease) ([place])   . Coronary artery disease   . Deep vein thrombosis of left lower extremity ([place]) 07/19/2012  . Dementia 09/16/2012   05/04/15 TSH 2.89   . Depression   .  Diverticulosis 05/10/2012  . DJD (degenerative joint disease)   . Dry skin dermatitis 11/18/2012  . Edema of leg 07/01/2010  . Epistaxis 01/24/2016   01/21/16: x2, held Xarelto x 1 day, Saline Nasal spray prn, wbc 9.5, Hgb 12.4, Plt 331, Na 138, K 4.5, Bun 25, creat 0.89 01/22/16 [id number](71-69)  . Esophageal dysmotility 07/26/2013   . Esophageal dysphagia 05/27/2010   New dx of achalasia 6/ 2015 09/18/13 Nifedipine 10mg  tid ac meals.    . Esophageal stricture   . Essential hypertension, benign 10/30/2013   12/02/15 wbc 6.1, Hgb 12.0, plt 271   . Essential thrombocythemia ([place]) 06/09/2009  . Fall 06/25/2013  . GERD (gastroesophageal reflux disease)   . Hip fracture ([place]) 09/08/2012  . HYPERCHOLESTEROLEMIA 03/12/2007   08/10/15 cholesterol 119, triglycerides 143, HDL 51, LDL 39   . Lumbar back pain   . MACULAR DEGENERATION 01/26/2008   Qualifier: Diagnosis of  By: Lenna Gilford MD, Deborra Medina   . Osteoarthritis 03/12/2007  . Osteoporosis 03/12/2007   Qualifier: Diagnosis of  By: Julien Girt CMA, Marliss Czar  06/15/14 dc Vit D 1000u po daily per pharm.    . Phlebitis and thrombophlebitis of other deep vessels of lower extremities 07/10/2010  . S/P balloon dilatation of esophageal stricture 07/28/2013   07/26/13. Dr. Henrene Pastor. EGD foreign removal and Balloon dilation of esophagus. Esophageal dysmotility, suspected achalasia, dilation at the GE junction to 70mm. Candida esophagitis.    . SYNCOPE 07/30/2009   Occurred 2011. Hospitalized. Felt due to dehydration.    . TRANSIENT ISCHEMIC ATTACKS, HX OF 03/12/2007   Qualifier: Diagnosis of  By: Julien Girt CMA, Marliss Czar     Past Surgical History:  Procedure Laterality Date  . ABDOMINAL HYSTERECTOMY     and bso  . APPENDECTOMY    . BOTOX INJECTION N/A 09/18/2013   Procedure: BOTOX INJECTION;  Surgeon: Lafayette Dragon, MD;  Location: [facility];  Service: Endoscopy;  Laterality: N/A;  . BOTOX INJECTION N/A 10/16/2014   Procedure: BOTOX INJECTION;  Surgeon: Inda Castle, MD;  Location: [facility];  Service: Endoscopy;  Laterality: N/A;  . BOTOX INJECTION N/A 05/18/2016   Procedure: BOTOX INJECTION;  Surgeon: Milus Banister, MD;  Location: [facility];  Service: Endoscopy;  Laterality: N/A;  . CATARACT EXTRACTION W/ INTRAOCULAR LENS  IMPLANT, BILATERAL    . CHOLECYSTECTOMY    . ESOPHAGEAL MANOMETRY N/A 08/25/2013    Procedure: ESOPHAGEAL MANOMETRY (EM);  Surgeon: Jerene Bears, MD;  Location: [facility];  Service: Gastroenterology;  Laterality: N/A;  . ESOPHAGOGASTRODUODENOSCOPY N/A 07/26/2013   Procedure: ESOPHAGOGASTRODUODENOSCOPY (EGD);  Surgeon: Irene Shipper, MD;  Location: [facility] ENDOSCOPY;  Service: Endoscopy;  Laterality: N/A;  . ESOPHAGOGASTRODUODENOSCOPY N/A 10/16/2014   Procedure: ESOPHAGOGASTRODUODENOSCOPY (EGD);  Surgeon: Inda Castle, MD;  Location: [facility];  Service: Endoscopy;  Laterality: N/A;  with botox  . ESOPHAGOGASTRODUODENOSCOPY (EGD) WITH ESOPHAGEAL DILATION N/A 11/25/2012   Procedure: ESOPHAGOGASTRODUODENOSCOPY (EGD) WITH ESOPHAGEAL DILATION;  Surgeon: Jerene Bears, MD;  Location: [facility];  Service: Gastroenterology;  Laterality: N/A;  . ESOPHAGOGASTRODUODENOSCOPY (EGD) WITH PROPOFOL N/A 09/18/2013   Procedure: ESOPHAGOGASTRODUODENOSCOPY (EGD) WITH PROPOFOL;  Surgeon: Lafayette Dragon, MD;  Location: [facility];  Service: Endoscopy;  Laterality: N/A;  . ESOPHAGOGASTRODUODENOSCOPY (EGD) WITH PROPOFOL N/A 05/18/2016   Procedure: ESOPHAGOGASTRODUODENOSCOPY (EGD) WITH PROPOFOL;  Surgeon: Milus Banister, MD;  Location: [facility];  Service: Endoscopy;  Laterality: N/A;  . HIP ARTHROPLASTY Right 09/09/2012   Procedure: ARTHROPLASTY BIPOLAR HIP;  Surgeon: Mauri Pole, MD;  Location:  [facility];  Service: Orthopedics;  Laterality: Right;  . TAH and BSO  08/21/1995    Allergies  Allergen Reactions  . Sulfamethoxazole Rash    Outpatient Encounter Medications as of 09/14/2017  Medication Sig  . acetaminophen (TYLENOL) 325 MG tablet Take 650 mg by mouth every 4 (four) hours as needed for moderate pain.   Marland Kitchen albuterol (PROVENTIL HFA;VENTOLIN HFA) 108 (90 BASE) MCG/ACT inhaler Inhale 2 puffs into the lungs every 6 (six) hours as needed for wheezing or shortness of breath.  Marland Kitchen atorvastatin (LIPITOR) 10 MG tablet Take 10 mg by mouth daily.  . CHLORHEXIDINE GLUCONATE, BULK, SOLN Take 5 mLs by  mouth daily. 5 ml swish and spit for 30 seconds every day indefinitely   . cholecalciferol (VITAMIN D) 1000 units tablet Take 1,000 Units by mouth daily.  . feeding supplement (RESOURCE BREEZE) LIQD Take 90 mLs by mouth 2 (two) times daily.   . fluticasone furoate-vilanterol (BREO ELLIPTA) 100-25 MCG/INH AEPB Inhale 1 puff into the lungs daily. One puff inhale once a day, rinse mouth after each use.   . Humidifier MISC Please use humidifier to prevent excessive dryness.  Marland Kitchen HYDROcodone-acetaminophen (NORCO) 10-325 MG per tablet Take 1 tablet by mouth 2 (two) times daily.   . hydroxyurea (HYDREA) 500 MG capsule Take 500-1,000 mg by mouth See admin instructions. Takes 1000 mg on Monday only Takes 500 mg on Sunday, Wednesday and Friday only  . memantine (NAMENDA) 10 MG tablet Take 10 mg by mouth 2 (two) times daily.   . metoprolol tartrate (LOPRESSOR) 25 MG tablet Take 25 mg by mouth 2 (two) times daily.  . montelukast (SINGULAIR) 10 MG tablet Take 10 mg by mouth at bedtime.  . Multiple Vitamins-Minerals (PRESERVISION AREDS 2) CAPS Take 1 capsule by mouth 2 (two) times daily.  Marland Kitchen NIFEdipine (PROCARDIA) 10 MG capsule Take 10 mg by mouth 3 (three) times daily. Squeeze contents of one capsule uner tongue 15-30 minutes before meals 3 times a day   . nitroGLYCERIN (NITROSTAT) 0.4 MG SL tablet Place 0.4 mg under the tongue every 5 (five) minutes as needed for chest pain.  Marland Kitchen omeprazole (PRILOSEC) 20 MG capsule Take 20 mg by mouth at bedtime.   . Rivaroxaban (XARELTO) 15 MG TABS tablet Take 15 mg by mouth every evening. 5pm  . sodium fluoride (DENTA 5000 PLUS) 1.1 % CREA dental cream Place 1 application onto teeth at bedtime.   No facility-administered encounter medications on file as of 09/14/2017.     Review of Systems  Constitutional: Negative for appetite change, chills and fever.  HENT: Positive for postnasal drip and trouble swallowing. Negative for congestion and mouth sores.   Respiratory: Negative  for shortness of breath.   Cardiovascular: Negative for chest pain and palpitations.  Gastrointestinal: Negative for abdominal pain, constipation, nausea and vomiting.  Genitourinary: Negative for dysuria.  Musculoskeletal: Positive for arthralgias, back pain and gait problem.  Skin: Negative for wound.  Neurological: Negative for headaches.  Psychiatric/Behavioral: Positive for confusion.    Immunization History  Administered Date(s) Administered  . Influenza Split 11/16/2010, 12/13/2011  . Influenza Whole 11/27/2009  . Influenza-Unspecified 12/31/2013, 11/18/2014, 12/14/2015, 12/11/2016  . PPD Test 09/11/2012  . Pneumococcal Polysaccharide-23 02/28/1988   Pertinent  Health Maintenance Due  Topic Date Due  . PNA vac Low Risk Adult (2 of 2 - PCV13) 02/27/1989  . INFLUENZA VACCINE  09/27/2017  . DEXA SCAN  Completed   Fall Risk  02/08/2017 02/08/2017 10/04/2015 12/28/2014 10/23/2014  Falls  in the past year? No Exclusion - non ambulatory No Yes Yes  Number falls in past yr: - - - 1 1  Injury with Fall? - - - No No  Risk for fall due to : - Impaired balance/gait;Impaired vision Impaired mobility;Impaired balance/gait History of fall(s);Impaired balance/gait;Impaired mobility Impaired mobility  Follow up - - - Falls prevention discussed Falls evaluation completed   Functional Status Survey:    Vitals:   09/14/17 1209  BP: 140/62  Pulse: 66  Resp: 20  Temp: 97.7 F (36.5 C)  TempSrc: Oral  SpO2: 95%  Weight: 99 lb 6.4 oz (45.1 kg)  Height: 5\' 1"  (1.549 m)   Body mass index is 18.78 kg/m.   Wt Readings from Last 3 Encounters:  09/14/17 99 lb 6.4 oz (45.1 kg)  08/16/17 98 lb 8 oz (44.7 kg)  07/19/17 99 lb (44.9 kg)   Physical Exam  Constitutional: No distress.  Frail elderly female  HENT:  Head: Normocephalic and atraumatic.  Nose: Nose normal.  Mouth/Throat: Oropharynx is clear and moist. No oropharyngeal exudate.  Eyes: Pupils are equal, round, and reactive to  light. Conjunctivae and EOM are normal. Right eye exhibits no discharge. Left eye exhibits no discharge.  Neck: Neck supple.  Cardiovascular: Normal rate and regular rhythm.  Pulmonary/Chest: Effort normal and breath sounds normal. She has no wheezes. She has no rales.  Abdominal: Soft. Bowel sounds are normal. She exhibits no mass. There is no tenderness. There is no guarding.  Musculoskeletal: She exhibits deformity.  Can move all 4 extremities, weakness to legs, needs assistance with transfer  Lymphadenopathy:    She has no cervical adenopathy.  Neurological: She is alert. She exhibits normal muscle tone.  Oriented to person and place.   Skin: Skin is warm and dry. She is not diaphoretic.  Psychiatric: She has a normal mood and affect.    Labs reviewed: Recent Labs    01/30/17  NA 139  K 4.3  BUN 19  CREATININE 0.8   Recent Labs    01/30/17  AST 17  ALT 11  ALKPHOS 68   Recent Labs    05/01/17 07/03/17 08/28/17  WBC 6.3 6.9 6.7  NEUTROABS  --  4,264  --   HGB 12.7 12.3 12.7  HCT 37 36 38  PLT 326 269 318   Lab Results  Component Value Date   TSH 2.00 09/21/2016   Lab Results  Component Value Date   HGBA1C 5.5 10/16/2014   Lab Results  Component Value Date   CHOL 137 03/01/2017   HDL 46 03/01/2017   LDLCALC 70 03/01/2017   LDLDIRECT 152.3 06/04/2009   TRIG 133 03/01/2017   CHOLHDL 3 09/13/2011    Significant Diagnostic Results in last 30 days:  No results found.  Assessment/Plan  1. HYPERCHOLESTEROLEMIA Continue lipitor, check lipid  2. Esophageal dysmotility Continue procardia and PPI, f/u with GI for botox injection  3. Hypertensive heart disease, unspecified whether heart failure present Stable, continue metoprolol  4. Unsteady gait Wheelchair bound, provide assistance with transfer and fall precautions. Keep appointment with orthopedic for evaluation of cortisone injection to his back. Continue current pain regimen    Family/ staff  Communication: reviewed care plan with patient and charge nurse.   Labs/tests ordered:  CMP, lipid   Blanchie Serve, MD Internal Medicine [facility] [address] [place], [place] [address] Cell Phone (Monday-Friday 8 am - 5 pm): [phone] On Call: [phone] and follow  prompts after 5 pm and on weekends Office Phone: [phone] Office Fax: [phone]

## 2017-09-18 DIAGNOSIS — E7800 Pure hypercholesterolemia, unspecified: Secondary | ICD-10-CM | POA: Diagnosis not present

## 2017-09-18 DIAGNOSIS — I10 Essential (primary) hypertension: Secondary | ICD-10-CM | POA: Diagnosis not present

## 2017-09-18 DIAGNOSIS — M5416 Radiculopathy, lumbar region: Secondary | ICD-10-CM | POA: Diagnosis not present

## 2017-09-18 LAB — HEPATIC FUNCTION PANEL
ALK PHOS: 71 (ref 25–125)
ALT: 13 (ref 7–35)
AST: 19 (ref 13–35)
Bilirubin, Total: 0.7

## 2017-09-18 LAB — BASIC METABOLIC PANEL
BUN: 21 (ref 4–21)
Creatinine: 0.8 (ref 0.5–1.1)
GLUCOSE: 104
POTASSIUM: 3.8 (ref 3.4–5.3)
SODIUM: 142 (ref 137–147)

## 2017-09-18 LAB — LIPID PANEL
CHOLESTEROL: 143 (ref 0–200)
HDL: 50 (ref 35–70)
LDL CALC: 70
TRIGLYCERIDES: 147 (ref 40–160)

## 2017-09-20 DIAGNOSIS — M545 Low back pain: Secondary | ICD-10-CM | POA: Diagnosis not present

## 2017-09-21 LAB — CMP 10231
Albumin: 4.2
Calcium: 9.4
Carbon Dioxide, Total: 30
Chloride: 104
GFR CALC NON AF AMER: 66
Globulin: 2.6
Magnesium: 1.9
Total Protein: 6.8

## 2017-09-26 ENCOUNTER — Other Ambulatory Visit: Payer: Self-pay

## 2017-09-26 MED ORDER — HYDROCODONE-ACETAMINOPHEN 10-325 MG PO TABS: 1.0000 | ORAL_TABLET | Freq: Two times a day (BID) | ORAL | 0 refills | Status: DC

## 2017-10-10 ENCOUNTER — Encounter: Payer: Self-pay | Admitting: Internal Medicine

## 2017-10-11 ENCOUNTER — Encounter: Payer: Self-pay | Admitting: Nurse Practitioner

## 2017-10-11 ENCOUNTER — Non-Acute Institutional Stay (SKILLED_NURSING_FACILITY): Payer: Medicare Other | Admitting: Nurse Practitioner

## 2017-10-11 DIAGNOSIS — G301 Alzheimer's disease with late onset: Secondary | ICD-10-CM

## 2017-10-11 DIAGNOSIS — K219 Gastro-esophageal reflux disease without esophagitis: Secondary | ICD-10-CM

## 2017-10-11 DIAGNOSIS — Z86718 Personal history of other venous thrombosis and embolism: Secondary | ICD-10-CM | POA: Diagnosis not present

## 2017-10-11 DIAGNOSIS — D473 Essential (hemorrhagic) thrombocythemia: Secondary | ICD-10-CM

## 2017-10-11 DIAGNOSIS — F0280 Dementia in other diseases classified elsewhere without behavioral disturbance: Secondary | ICD-10-CM

## 2017-10-11 DIAGNOSIS — M5440 Lumbago with sciatica, unspecified side: Secondary | ICD-10-CM | POA: Diagnosis not present

## 2017-10-11 DIAGNOSIS — I10 Essential (primary) hypertension: Secondary | ICD-10-CM

## 2017-10-11 DIAGNOSIS — G8929 Other chronic pain: Secondary | ICD-10-CM

## 2017-10-11 NOTE — Assessment & Plan Note (Signed)
Stable, continue Omeprazole 20mg  qd, Nifedipine 10mg  tid.

## 2017-10-11 NOTE — Progress Notes (Addendum)
Location:  [facility] Room Number: 35 Place of Service:  SNF ([phone]) Provider:  Kristle Wesch, ManXie  NP  Blanchie Serve, MD  Patient Care Team: Blanchie Serve, MD as PCP - General (Internal Medicine) Annia Belt, MD as Consulting Physician (Hematology and Oncology) Suella Broad, MD as Consulting Physician (Physical Medicine and Rehabilitation) Noralee Space, MD (Pulmonary Disease) [place] Karla Pavone X, NP as Nurse Practitioner (Nurse Practitioner) Inda Castle, MD (Inactive) as Consulting Physician (Gastroenterology)  Extended Emergency Contact Information Primary Emergency Contact: Streicher,Gene Address: [address]          [place], [place] [address] Johnnette Litter of [place] Phone: [phone] Work Phone: [phone] Mobile Phone: [phone] Relation: Son Secondary Emergency Contact: [name], [place] of [place] Phone: [phone] Relation: Son  Code Status:  DNR Goals of care: Advanced Directive information Advanced Directives 10/11/2017  Does Patient Have a Medical Advance Directive? Yes  Type of Paramedic of [place];Living will;Out of facility DNR (pink MOST or yellow form)  Does patient want to make changes to medical advance directive? No - Patient declined  Copy of [place] in Chart? Yes  Pre-existing out of facility DNR order (yellow form or pink MOST form) Yellow form placed in chart (order not valid for inpatient use)     Chief Complaint  Patient presents with  . Medical Management of Chronic Issues    F/U- Alzheimers, GERD, unsteady gait,     HPI:  Pt is a 84 y.o. female seen today for medical management of chronic diseases.    The patient has history of essential thrombocythemia, controlled on Hydroxyurea, 08/28/17 plt 318. Hx of DVT, chronic on Xarelto 15mg  qd. GERD, stable on Omeprazole 20mg  qd, Nifedipine 10mg   tid. She resides in SN[facility] for care assistance due to her dementia, she takes Memantine 10mg  bid for memory. Hx HTN, blood pressure is controlled on Metoprolol 25mg  bid. Chronic lower back pain is not well controlled on Norco 10/325mg  bid.   Past Medical History:  Diagnosis Date  . Abnormal weight gain 05/09/2012  . Achalasia of esophagus 09/18/2013   09/18/13 Nifedipine 10mg  tid ac meals. 11/17/14 [facility] f/u as neeed   . Anxiety and depression 03/12/2012   Increased Sertraline to 50mg  daily 07/16/13 08/26/13 Mirtazapine 7.5mg  qhs  10/12/14 Pharm decreased Sertraline to 25mg .  05/04/15 TSH 2.89 09/15/15 pharm taper off Sertraline.  10/08/15 continue Sertraline 25mg  daily, POA desires. 12/02/15 wbc 6.1, Hgb 12.0, plt 271    . Asthmatic bronchitis   . BACK PAIN, LUMBAR 03/12/2007   Hx of lower back pain, had X-ray of her lumbar spine 09/08/12--spondylosis-better controlled with Hydrocodone/ApAp  10/325mg  ac and hs  01/01/13 lumbar spine inj. Able to ambulate with walker on unit until her fall 06/22/13. 06/25/13 unremarkable. X-ray lumbar spine. 11/10/13 dc Oxycodone prn-not used in 60 days.     . COLONIC POLYPS 03/12/2007   Initially noted April 2002 colonoscopy. Followup colonoscopy 05/31/2006 did not disclose any further polyps.    Marland Kitchen COPD (chronic obstructive pulmonary disease) ([place])   . Coronary artery disease   . Deep vein thrombosis of left lower extremity ([place]) 07/19/2012  . Dementia 09/16/2012   05/04/15 TSH 2.89   . Depression   . Diverticulosis 05/10/2012  . DJD (degenerative joint disease)   . Dry skin dermatitis 11/18/2012  . Edema of leg 07/01/2010  .  Epistaxis 01/24/2016   01/21/16: x2, held Xarelto x 1 day, Saline Nasal spray prn, wbc 9.5, Hgb 12.4, Plt 331, Na 138, K 4.5, Bun 25, creat 0.89 01/22/16 MBW46(65-99)  . Esophageal dysmotility 07/26/2013  . Esophageal dysphagia 05/27/2010   New dx of achalasia 6/ 2015 09/18/13 Nifedipine 10mg  tid ac meals.    . Esophageal stricture   . Essential  hypertension, benign 10/30/2013   12/02/15 wbc 6.1, Hgb 12.0, plt 271   . Essential thrombocythemia ([place]) 06/09/2009  . Fall 06/25/2013  . GERD (gastroesophageal reflux disease)   . Hip fracture ([place]) 09/08/2012  . HYPERCHOLESTEROLEMIA 03/12/2007   08/10/15 cholesterol 119, triglycerides 143, HDL 51, LDL 39   . Lumbar back pain   . MACULAR DEGENERATION 01/26/2008   Qualifier: Diagnosis of  By: Lenna Gilford MD, Deborra Medina   . Osteoarthritis 03/12/2007  . Osteoporosis 03/12/2007   Qualifier: Diagnosis of  By: Julien Girt CMA, Marliss Czar  06/15/14 dc Vit D 1000u po daily per pharm.    . Phlebitis and thrombophlebitis of other deep vessels of lower extremities 07/10/2010  . S/P balloon dilatation of esophageal stricture 07/28/2013   07/26/13. Dr. Henrene Pastor. EGD foreign removal and Balloon dilation of esophagus. Esophageal dysmotility, suspected achalasia, dilation at the GE junction to 71mm. Candida esophagitis.    . SYNCOPE 07/30/2009   Occurred 2011. Hospitalized. Felt due to dehydration.    . TRANSIENT ISCHEMIC ATTACKS, HX OF 03/12/2007   Qualifier: Diagnosis of  By: Julien Girt CMA, Marliss Czar     Past Surgical History:  Procedure Laterality Date  . ABDOMINAL HYSTERECTOMY     and bso  . APPENDECTOMY    . BOTOX INJECTION N/A 09/18/2013   Procedure: BOTOX INJECTION;  Surgeon: Lafayette Dragon, MD;  Location: [facility];  Service: Endoscopy;  Laterality: N/A;  . BOTOX INJECTION N/A 10/16/2014   Procedure: BOTOX INJECTION;  Surgeon: Inda Castle, MD;  Location: [facility];  Service: Endoscopy;  Laterality: N/A;  . BOTOX INJECTION N/A 05/18/2016   Procedure: BOTOX INJECTION;  Surgeon: Milus Banister, MD;  Location: [facility];  Service: Endoscopy;  Laterality: N/A;  . CATARACT EXTRACTION W/ INTRAOCULAR LENS  IMPLANT, BILATERAL    . CHOLECYSTECTOMY    . ESOPHAGEAL MANOMETRY N/A 08/25/2013   Procedure: ESOPHAGEAL MANOMETRY (EM);  Surgeon: Jerene Bears, MD;  Location: [facility];  Service: Gastroenterology;  Laterality: N/A;  .  ESOPHAGOGASTRODUODENOSCOPY N/A 07/26/2013   Procedure: ESOPHAGOGASTRODUODENOSCOPY (EGD);  Surgeon: Irene Shipper, MD;  Location: [facility] ENDOSCOPY;  Service: Endoscopy;  Laterality: N/A;  . ESOPHAGOGASTRODUODENOSCOPY N/A 10/16/2014   Procedure: ESOPHAGOGASTRODUODENOSCOPY (EGD);  Surgeon: Inda Castle, MD;  Location: [facility];  Service: Endoscopy;  Laterality: N/A;  with botox  . ESOPHAGOGASTRODUODENOSCOPY (EGD) WITH ESOPHAGEAL DILATION N/A 11/25/2012   Procedure: ESOPHAGOGASTRODUODENOSCOPY (EGD) WITH ESOPHAGEAL DILATION;  Surgeon: Jerene Bears, MD;  Location: [facility];  Service: Gastroenterology;  Laterality: N/A;  . ESOPHAGOGASTRODUODENOSCOPY (EGD) WITH PROPOFOL N/A 09/18/2013   Procedure: ESOPHAGOGASTRODUODENOSCOPY (EGD) WITH PROPOFOL;  Surgeon: Lafayette Dragon, MD;  Location: [facility];  Service: Endoscopy;  Laterality: N/A;  . ESOPHAGOGASTRODUODENOSCOPY (EGD) WITH PROPOFOL N/A 05/18/2016   Procedure: ESOPHAGOGASTRODUODENOSCOPY (EGD) WITH PROPOFOL;  Surgeon: Milus Banister, MD;  Location: [facility];  Service: Endoscopy;  Laterality: N/A;  . HIP ARTHROPLASTY Right 09/09/2012   Procedure: ARTHROPLASTY BIPOLAR HIP;  Surgeon: Mauri Pole, MD;  Location: [facility];  Service: Orthopedics;  Laterality: Right;  . TAH and BSO  08/21/1995    Allergies  Allergen Reactions  .  Sulfamethoxazole Rash    Outpatient Encounter Medications as of 10/11/2017  Medication Sig  . acetaminophen (TYLENOL) 325 MG tablet Take 650 mg by mouth every 4 (four) hours as needed for moderate pain.   Marland Kitchen albuterol (PROVENTIL HFA;VENTOLIN HFA) 108 (90 BASE) MCG/ACT inhaler Inhale 2 puffs into the lungs every 6 (six) hours as needed for wheezing or shortness of breath.  Marland Kitchen atorvastatin (LIPITOR) 10 MG tablet Take 10 mg by mouth daily.  . CHLORHEXIDINE GLUCONATE, BULK, SOLN Take 5 mLs by mouth daily. 5 ml swish and spit for 30 seconds every day indefinitely   . cholecalciferol (VITAMIN D) 1000 units tablet Take 1,000 Units by  mouth daily.  . feeding supplement (RESOURCE BREEZE) LIQD Take 90 mLs by mouth 2 (two) times daily.   . fluticasone furoate-vilanterol (BREO ELLIPTA) 100-25 MCG/INH AEPB Inhale 1 puff into the lungs daily. One puff inhale once a day, rinse mouth after each use.   . Humidifier MISC Please use humidifier to prevent excessive dryness.  Marland Kitchen HYDROcodone-acetaminophen (NORCO) 10-325 MG tablet Take 1 tablet by mouth 2 (two) times daily.  . hydroxyurea (HYDREA) 500 MG capsule Take 500-1,000 mg by mouth See admin instructions. Takes 1000 mg on Monday only Takes 500 mg on Sunday, Wednesday and Friday only  . memantine (NAMENDA) 10 MG tablet Take 10 mg by mouth 2 (two) times daily.   . metoprolol tartrate (LOPRESSOR) 25 MG tablet Take 25 mg by mouth 2 (two) times daily.  . montelukast (SINGULAIR) 10 MG tablet Take 10 mg by mouth at bedtime.  . Multiple Vitamins-Minerals (PRESERVISION AREDS 2) CAPS Take 1 capsule by mouth 2 (two) times daily.  Marland Kitchen NIFEdipine (PROCARDIA) 10 MG capsule Take 10 mg by mouth 3 (three) times daily. Squeeze contents of one capsule uner tongue 15-30 minutes before meals 3 times a day   . nitroGLYCERIN (NITROSTAT) 0.4 MG SL tablet Place 0.4 mg under the tongue every 5 (five) minutes as needed for chest pain.  Marland Kitchen omeprazole (PRILOSEC) 20 MG capsule Take 20 mg by mouth at bedtime.   . Rivaroxaban (XARELTO) 15 MG TABS tablet Take 15 mg by mouth every evening. 5pm  . sodium fluoride (DENTA 5000 PLUS) 1.1 % CREA dental cream Place 1 application onto teeth at bedtime.   No facility-administered encounter medications on file as of 10/11/2017.    ROS was provided with assistance of staff Review of Systems  Constitutional: Negative for activity change, appetite change, chills, diaphoresis, fatigue, fever and unexpected weight change.       Baseline weight around #100Ibs, currently is #98Ibs. On dietary supplement.   HENT: Positive for hearing loss and trouble swallowing. Negative for voice  change.   Respiratory: Negative for cough, shortness of breath and wheezing.   Cardiovascular: Negative for chest pain, palpitations and leg swelling.  Gastrointestinal: Negative for abdominal distention, abdominal pain, constipation, diarrhea and nausea.  Genitourinary: Negative for difficulty urinating, dysuria and urgency.  Musculoskeletal: Positive for back pain and gait problem.  Skin: Negative for color change and pallor.  Neurological: Negative for dizziness, speech difficulty and headaches.       Memory lapses.   Psychiatric/Behavioral: Positive for confusion. Negative for agitation, behavioral problems, hallucinations and sleep disturbance. The patient is not nervous/anxious.     Immunization History  Administered Date(s) Administered  . Influenza Split 11/16/2010, 12/13/2011  . Influenza Whole 11/27/2009  . Influenza-Unspecified 12/31/2013, 11/18/2014, 12/14/2015, 12/11/2016  . PPD Test 09/11/2012  . Pneumococcal Polysaccharide-23 02/28/1988   Pertinent  Health  Maintenance Due  Topic Date Due  . PNA vac Low Risk Adult (2 of 2 - PCV13) 02/27/1989  . INFLUENZA VACCINE  09/27/2017  . DEXA SCAN  Completed   Fall Risk  02/08/2017 02/08/2017 10/04/2015 12/28/2014 10/23/2014  Falls in the past year? No Exclusion - non ambulatory No Yes Yes  Number falls in past yr: - - - 1 1  Injury with Fall? - - - No No  Risk for fall due to : - Impaired balance/gait;Impaired vision Impaired mobility;Impaired balance/gait History of fall(s);Impaired balance/gait;Impaired mobility Impaired mobility  Follow up - - - Falls prevention discussed Falls evaluation completed   Functional Status Survey:    Vitals:   10/11/17 1151  BP: 128/68  Pulse: 74  Resp: 17  Temp: 98.1 F (36.7 C)  SpO2: 95%  Weight: 97 lb 9.6 oz (44.3 kg)  Height: 5\' 1"  (1.549 m)   Body mass index is 18.44 kg/m. Physical Exam  Constitutional: She appears well-developed and well-nourished.  HENT:  Head: Normocephalic  and atraumatic.  Eyes: Pupils are equal, round, and reactive to light. EOM are normal.  Neck: Normal range of motion. Neck supple. No JVD present. No thyromegaly present.  Cardiovascular: Normal rate and regular rhythm.  No murmur heard. Pulmonary/Chest: Effort normal. She has no wheezes. She has no rales.  Decreased air entry  Abdominal: Soft. Bowel sounds are normal. She exhibits no distension. There is no tenderness.  Musculoskeletal: She exhibits no edema.  Needs assistance for transfer, w/c to get around. Chronic lower back pain is well controlled.   Neurological: She is alert. No cranial nerve deficit. She exhibits normal muscle tone. Coordination normal.  Oriented to person and place.   Skin: Skin is warm and dry.  Psychiatric: She has a normal mood and affect. Her behavior is normal.    Labs reviewed: Recent Labs    01/30/17 09/18/17  NA 139 142  K 4.3 3.8  CL  --  104  CO2  --  30  BUN 19 21  CREATININE 0.8 0.8  CALCIUM  --  9.4  MG  --  1.9   Recent Labs    01/30/17 09/18/17  AST 17 19  ALT 11 13  ALKPHOS 68 71  PROT  --  6.8  ALBUMIN  --  4.2   Recent Labs    05/01/17 07/03/17 08/28/17  WBC 6.3 6.9 6.7  NEUTROABS  --  4,264  --   HGB 12.7 12.3 12.7  HCT 37 36 38  PLT 326 269 318   Lab Results  Component Value Date   TSH 2.00 09/21/2016   Lab Results  Component Value Date   HGBA1C 5.5 10/16/2014   Lab Results  Component Value Date   CHOL 143 09/18/2017   HDL 50 09/18/2017   LDLCALC 70 09/18/2017   LDLDIRECT 152.3 06/04/2009   TRIG 147 09/18/2017   CHOLHDL 3 09/13/2011    Significant Diagnostic Results in last 30 days:  No results found.  Assessment/Plan Essential thrombocythemia (HCC) Stable, last plt was 318 08/28/17, continue Hydroxyurea.   History of DVT (deep vein thrombosis) Continue chronic anticoagulation therapy, continue Xarelto 15mg  qd.   BACK PAIN, LUMBAR complaining lower back pain sometime in evenings, continue Norco  10/325mg  bid, will have Norco 10/325mg  qd prn available to her. Obtain pain assessment.   GERD Stable, continue Omeprazole 20mg  qd, Nifedipine 10mg  tid.   Essential hypertension, benign Blood pressure is in control, continue Metoprolol 25mg  bid.   Alzheimer's  dementia without behavioral disturbance Continue SNF [facility] for care assistance, continue Memantine 10mg  bid to preserve memory.      Family/ staff Communication: plan of care reviewed with the patient and charge nurse.   Labs/tests ordered:  None   Time spend 25 minutes

## 2017-10-11 NOTE — Assessment & Plan Note (Signed)
Stable, last plt was 318 08/28/17, continue Hydroxyurea.

## 2017-10-11 NOTE — Assessment & Plan Note (Signed)
Continue SNF [facility] for care assistance, continue Memantine 10mg  bid to preserve memory.

## 2017-10-11 NOTE — Assessment & Plan Note (Addendum)
complaining lower back pain sometime in evenings, continue Norco 10/325mg  bid, will have Norco 10/325mg  qd prn available to her. Obtain pain assessment.

## 2017-10-11 NOTE — Assessment & Plan Note (Signed)
Continue chronic anticoagulation therapy, continue Xarelto 15mg  qd.

## 2017-10-11 NOTE — Assessment & Plan Note (Signed)
Blood pressure is in control, continue Metoprolol 25mg bid.  

## 2017-10-15 ENCOUNTER — Encounter: Payer: Self-pay | Admitting: Nurse Practitioner

## 2017-10-19 DIAGNOSIS — H04129 Dry eye syndrome of unspecified lacrimal gland: Secondary | ICD-10-CM | POA: Diagnosis not present

## 2017-10-19 DIAGNOSIS — Z86718 Personal history of other venous thrombosis and embolism: Secondary | ICD-10-CM | POA: Diagnosis not present

## 2017-10-19 DIAGNOSIS — K219 Gastro-esophageal reflux disease without esophagitis: Secondary | ICD-10-CM | POA: Diagnosis not present

## 2017-10-19 DIAGNOSIS — M810 Age-related osteoporosis without current pathological fracture: Secondary | ICD-10-CM | POA: Diagnosis not present

## 2017-10-19 DIAGNOSIS — F329 Major depressive disorder, single episode, unspecified: Secondary | ICD-10-CM | POA: Diagnosis not present

## 2017-10-19 DIAGNOSIS — M6281 Muscle weakness (generalized): Secondary | ICD-10-CM | POA: Diagnosis not present

## 2017-10-19 DIAGNOSIS — J449 Chronic obstructive pulmonary disease, unspecified: Secondary | ICD-10-CM | POA: Diagnosis not present

## 2017-10-19 DIAGNOSIS — F0280 Dementia in other diseases classified elsewhere without behavioral disturbance: Secondary | ICD-10-CM | POA: Diagnosis not present

## 2017-10-19 DIAGNOSIS — K5793 Diverticulitis of intestine, part unspecified, without perforation or abscess with bleeding: Secondary | ICD-10-CM | POA: Diagnosis not present

## 2017-10-19 DIAGNOSIS — M5440 Lumbago with sciatica, unspecified side: Secondary | ICD-10-CM | POA: Diagnosis not present

## 2017-10-19 DIAGNOSIS — E43 Unspecified severe protein-calorie malnutrition: Secondary | ICD-10-CM | POA: Diagnosis not present

## 2017-10-19 DIAGNOSIS — G309 Alzheimer's disease, unspecified: Secondary | ICD-10-CM | POA: Diagnosis not present

## 2017-10-19 DIAGNOSIS — J209 Acute bronchitis, unspecified: Secondary | ICD-10-CM | POA: Diagnosis not present

## 2017-10-19 DIAGNOSIS — G8929 Other chronic pain: Secondary | ICD-10-CM | POA: Diagnosis not present

## 2017-10-19 DIAGNOSIS — K224 Dyskinesia of esophagus: Secondary | ICD-10-CM | POA: Diagnosis not present

## 2017-10-19 DIAGNOSIS — R293 Abnormal posture: Secondary | ICD-10-CM | POA: Diagnosis not present

## 2017-10-23 DIAGNOSIS — R293 Abnormal posture: Secondary | ICD-10-CM | POA: Diagnosis not present

## 2017-10-25 DIAGNOSIS — R293 Abnormal posture: Secondary | ICD-10-CM | POA: Diagnosis not present

## 2017-10-26 DIAGNOSIS — R293 Abnormal posture: Secondary | ICD-10-CM | POA: Diagnosis not present

## 2017-10-29 DIAGNOSIS — I10 Essential (primary) hypertension: Secondary | ICD-10-CM | POA: Diagnosis not present

## 2017-10-29 LAB — CBC AND DIFFERENTIAL
HCT: 36 (ref 36–46)
HEMOGLOBIN: 12.3 (ref 12.0–16.0)
PLATELETS: 264 (ref 150–399)
WBC: 6.3

## 2017-10-30 DIAGNOSIS — R293 Abnormal posture: Secondary | ICD-10-CM | POA: Diagnosis not present

## 2017-10-30 DIAGNOSIS — M6281 Muscle weakness (generalized): Secondary | ICD-10-CM | POA: Diagnosis not present

## 2017-10-30 DIAGNOSIS — J209 Acute bronchitis, unspecified: Secondary | ICD-10-CM | POA: Diagnosis not present

## 2017-10-30 DIAGNOSIS — M5440 Lumbago with sciatica, unspecified side: Secondary | ICD-10-CM | POA: Diagnosis not present

## 2017-10-30 DIAGNOSIS — K224 Dyskinesia of esophagus: Secondary | ICD-10-CM | POA: Diagnosis not present

## 2017-10-30 DIAGNOSIS — G309 Alzheimer's disease, unspecified: Secondary | ICD-10-CM | POA: Diagnosis not present

## 2017-10-30 DIAGNOSIS — M810 Age-related osteoporosis without current pathological fracture: Secondary | ICD-10-CM | POA: Diagnosis not present

## 2017-10-30 DIAGNOSIS — J449 Chronic obstructive pulmonary disease, unspecified: Secondary | ICD-10-CM | POA: Diagnosis not present

## 2017-10-30 DIAGNOSIS — K5793 Diverticulitis of intestine, part unspecified, without perforation or abscess with bleeding: Secondary | ICD-10-CM | POA: Diagnosis not present

## 2017-10-30 DIAGNOSIS — G8929 Other chronic pain: Secondary | ICD-10-CM | POA: Diagnosis not present

## 2017-10-30 DIAGNOSIS — Z86718 Personal history of other venous thrombosis and embolism: Secondary | ICD-10-CM | POA: Diagnosis not present

## 2017-10-30 DIAGNOSIS — F329 Major depressive disorder, single episode, unspecified: Secondary | ICD-10-CM | POA: Diagnosis not present

## 2017-10-30 DIAGNOSIS — F0280 Dementia in other diseases classified elsewhere without behavioral disturbance: Secondary | ICD-10-CM | POA: Diagnosis not present

## 2017-10-30 DIAGNOSIS — H04129 Dry eye syndrome of unspecified lacrimal gland: Secondary | ICD-10-CM | POA: Diagnosis not present

## 2017-10-30 DIAGNOSIS — E43 Unspecified severe protein-calorie malnutrition: Secondary | ICD-10-CM | POA: Diagnosis not present

## 2017-10-30 DIAGNOSIS — K219 Gastro-esophageal reflux disease without esophagitis: Secondary | ICD-10-CM | POA: Diagnosis not present

## 2017-10-31 ENCOUNTER — Other Ambulatory Visit: Payer: Self-pay | Admitting: *Deleted

## 2017-11-02 ENCOUNTER — Other Ambulatory Visit: Payer: Self-pay | Admitting: *Deleted

## 2017-11-02 DIAGNOSIS — R293 Abnormal posture: Secondary | ICD-10-CM | POA: Diagnosis not present

## 2017-11-05 DIAGNOSIS — K219 Gastro-esophageal reflux disease without esophagitis: Secondary | ICD-10-CM | POA: Diagnosis not present

## 2017-11-05 DIAGNOSIS — Z86718 Personal history of other venous thrombosis and embolism: Secondary | ICD-10-CM | POA: Diagnosis not present

## 2017-11-05 DIAGNOSIS — M5440 Lumbago with sciatica, unspecified side: Secondary | ICD-10-CM | POA: Diagnosis not present

## 2017-11-05 DIAGNOSIS — R293 Abnormal posture: Secondary | ICD-10-CM | POA: Diagnosis not present

## 2017-11-05 DIAGNOSIS — J209 Acute bronchitis, unspecified: Secondary | ICD-10-CM | POA: Diagnosis not present

## 2017-11-05 DIAGNOSIS — H04129 Dry eye syndrome of unspecified lacrimal gland: Secondary | ICD-10-CM | POA: Diagnosis not present

## 2017-11-05 DIAGNOSIS — K5793 Diverticulitis of intestine, part unspecified, without perforation or abscess with bleeding: Secondary | ICD-10-CM | POA: Diagnosis not present

## 2017-11-05 DIAGNOSIS — K224 Dyskinesia of esophagus: Secondary | ICD-10-CM | POA: Diagnosis not present

## 2017-11-05 DIAGNOSIS — F329 Major depressive disorder, single episode, unspecified: Secondary | ICD-10-CM | POA: Diagnosis not present

## 2017-11-05 DIAGNOSIS — J449 Chronic obstructive pulmonary disease, unspecified: Secondary | ICD-10-CM | POA: Diagnosis not present

## 2017-11-05 DIAGNOSIS — E43 Unspecified severe protein-calorie malnutrition: Secondary | ICD-10-CM | POA: Diagnosis not present

## 2017-11-05 DIAGNOSIS — M6281 Muscle weakness (generalized): Secondary | ICD-10-CM | POA: Diagnosis not present

## 2017-11-05 DIAGNOSIS — F0280 Dementia in other diseases classified elsewhere without behavioral disturbance: Secondary | ICD-10-CM | POA: Diagnosis not present

## 2017-11-05 DIAGNOSIS — M810 Age-related osteoporosis without current pathological fracture: Secondary | ICD-10-CM | POA: Diagnosis not present

## 2017-11-05 DIAGNOSIS — G309 Alzheimer's disease, unspecified: Secondary | ICD-10-CM | POA: Diagnosis not present

## 2017-11-05 DIAGNOSIS — G8929 Other chronic pain: Secondary | ICD-10-CM | POA: Diagnosis not present

## 2017-11-08 ENCOUNTER — Encounter: Payer: Self-pay | Admitting: Nurse Practitioner

## 2017-11-08 ENCOUNTER — Non-Acute Institutional Stay (SKILLED_NURSING_FACILITY): Payer: Medicare Other | Admitting: Nurse Practitioner

## 2017-11-08 DIAGNOSIS — M5440 Lumbago with sciatica, unspecified side: Secondary | ICD-10-CM

## 2017-11-08 DIAGNOSIS — K222 Esophageal obstruction: Secondary | ICD-10-CM | POA: Diagnosis not present

## 2017-11-08 DIAGNOSIS — G309 Alzheimer's disease, unspecified: Secondary | ICD-10-CM | POA: Diagnosis not present

## 2017-11-08 DIAGNOSIS — G8929 Other chronic pain: Secondary | ICD-10-CM | POA: Diagnosis not present

## 2017-11-08 DIAGNOSIS — Z7901 Long term (current) use of anticoagulants: Secondary | ICD-10-CM

## 2017-11-08 DIAGNOSIS — F329 Major depressive disorder, single episode, unspecified: Secondary | ICD-10-CM | POA: Diagnosis not present

## 2017-11-08 DIAGNOSIS — K219 Gastro-esophageal reflux disease without esophagitis: Secondary | ICD-10-CM | POA: Diagnosis not present

## 2017-11-08 DIAGNOSIS — J449 Chronic obstructive pulmonary disease, unspecified: Secondary | ICD-10-CM

## 2017-11-08 DIAGNOSIS — F0280 Dementia in other diseases classified elsewhere without behavioral disturbance: Secondary | ICD-10-CM | POA: Diagnosis not present

## 2017-11-08 DIAGNOSIS — K224 Dyskinesia of esophagus: Secondary | ICD-10-CM | POA: Diagnosis not present

## 2017-11-08 DIAGNOSIS — E43 Unspecified severe protein-calorie malnutrition: Secondary | ICD-10-CM | POA: Diagnosis not present

## 2017-11-08 DIAGNOSIS — J209 Acute bronchitis, unspecified: Secondary | ICD-10-CM | POA: Diagnosis not present

## 2017-11-08 DIAGNOSIS — R293 Abnormal posture: Secondary | ICD-10-CM | POA: Diagnosis not present

## 2017-11-08 DIAGNOSIS — I119 Hypertensive heart disease without heart failure: Secondary | ICD-10-CM | POA: Diagnosis not present

## 2017-11-08 DIAGNOSIS — K5793 Diverticulitis of intestine, part unspecified, without perforation or abscess with bleeding: Secondary | ICD-10-CM | POA: Diagnosis not present

## 2017-11-08 DIAGNOSIS — Z86718 Personal history of other venous thrombosis and embolism: Secondary | ICD-10-CM | POA: Diagnosis not present

## 2017-11-08 DIAGNOSIS — M6281 Muscle weakness (generalized): Secondary | ICD-10-CM | POA: Diagnosis not present

## 2017-11-08 DIAGNOSIS — M810 Age-related osteoporosis without current pathological fracture: Secondary | ICD-10-CM | POA: Diagnosis not present

## 2017-11-08 DIAGNOSIS — H04129 Dry eye syndrome of unspecified lacrimal gland: Secondary | ICD-10-CM | POA: Diagnosis not present

## 2017-11-08 NOTE — Assessment & Plan Note (Signed)
s/p dilatation, stable, continue Omeprazole 20mg  qd, Nifedipine 10mg  tid.

## 2017-11-08 NOTE — Assessment & Plan Note (Signed)
Lower back pain is managed, continue  prn Tylenol, Norco 10/325mg  bid, and w/c.

## 2017-11-08 NOTE — Assessment & Plan Note (Signed)
Stable, continue Montelukast 10mg  qd, Breo Ellipta daily, prn Albuterol q6h.

## 2017-11-08 NOTE — Progress Notes (Signed)
Location:  [place] Room Number: 35 Place of Service:  SNF ([id number]) Provider:  Sherri Mcarthy, ManXie  NP  Blanchie Serve, MD  Patient Care Team: Blanchie Serve, MD as PCP - General (Internal Medicine) Annia Belt, MD as Consulting Physician (Hematology and Oncology) Suella Broad, MD as Consulting Physician (Physical Medicine and Rehabilitation) Noralee Space, MD (Pulmonary Disease) [place] Korynn Kenedy X, NP as Nurse Practitioner (Nurse Practitioner) Inda Castle, MD (Inactive) as Consulting Physician (Gastroenterology)  Extended Emergency Contact Information Primary Emergency Contact: Favia,Gene Address: [address]          [place], [place] [address] Johnnette Litter of [place] Phone: [phone] Work Phone: [phone] Mobile Phone: [phone] Relation: Son Secondary Emergency Contact: [name], [place] of [place] Phone: [phone] Relation: Son  Code Status:  DNR Goals of care: Advanced Directive information Advanced Directives 11/08/2017  Does Patient Have a Medical Advance Directive? Yes  Type of Paramedic of [place];Living will;Out of facility DNR (pink MOST or yellow form)  Does patient want to make changes to medical advance directive? No - Patient declined  Copy of [place] in Chart? Yes  Pre-existing out of facility DNR order (yellow form or pink MOST form) Yellow form placed in chart (order not valid for inpatient use)     Chief Complaint  Patient presents with  . Medical Management of Chronic Issues    F/u-GERD, HTH, Alzheimers    HPI:  Pt is a 84 y.o. female seen today for medical management of chronic diseases.     The patient has history of DVTs, on Xarelto 15mg  qd for long term. HTN, blood pressure is controlled on Metoprolol 25mg  bid. GERD, s/p dilatation, on Omeprazole 20mg  qd, Nifedipine 10mg  tid. COPD  stable, on Montelukast 10mg  qd, Breo Ellipta daily, prn Albuterol q6h. Lower back pain is manged with prn Tylenol, Norco 10/325mg  bid, and w/c.   Past Medical History:  Diagnosis Date  . Abnormal weight gain 05/09/2012  . Achalasia of esophagus 09/18/2013   09/18/13 Nifedipine 10mg  tid ac meals. 11/17/14 [facility] f/u as neeed   . Anxiety and depression 03/12/2012   Increased Sertraline to 50mg  daily 07/16/13 08/26/13 Mirtazapine 7.5mg  qhs  10/12/14 Pharm decreased Sertraline to 25mg .  05/04/15 TSH 2.89 09/15/15 pharm taper off Sertraline.  10/08/15 continue Sertraline 25mg  daily, POA desires. 12/02/15 wbc 6.1, Hgb 12.0, plt 271    . Asthmatic bronchitis   . BACK PAIN, LUMBAR 03/12/2007   Hx of lower back pain, had X-ray of her lumbar spine 09/08/12--spondylosis-better controlled with Hydrocodone/ApAp  10/325mg  ac and hs  01/01/13 lumbar spine inj. Able to ambulate with walker on unit until her fall 06/22/13. 06/25/13 unremarkable. X-ray lumbar spine. 11/10/13 dc Oxycodone prn-not used in 60 days.     . COLONIC POLYPS 03/12/2007   Initially noted April 2002 colonoscopy. Followup colonoscopy 05/31/2006 did not disclose any further polyps.    Marland Kitchen COPD (chronic obstructive pulmonary disease) ([place])   . Coronary artery disease   . Deep vein thrombosis of left lower extremity ([place]) 07/19/2012  . Dementia 09/16/2012   05/04/15 TSH 2.89   . Depression   . Diverticulosis 05/10/2012  . DJD (degenerative joint disease)   . Dry skin dermatitis 11/18/2012  . Edema of leg 07/01/2010  . Epistaxis 01/24/2016   01/21/16: x2, held Xarelto x 1 day, Saline Nasal spray  prn, wbc 9.5, Hgb 12.4, Plt 331, Na 138, K 4.5, Bun 25, creat 0.89 01/22/16 [id number](63-01)  . Esophageal dysmotility 07/26/2013  . Esophageal dysphagia 05/27/2010   New dx of achalasia 6/ 2015 09/18/13 Nifedipine 10mg  tid ac meals.    . Esophageal stricture   . Essential hypertension, benign 10/30/2013   12/02/15 wbc 6.1, Hgb 12.0, plt 271   . Essential thrombocythemia ([place])  06/09/2009  . Fall 06/25/2013  . GERD (gastroesophageal reflux disease)   . Hip fracture ([place]) 09/08/2012  . HYPERCHOLESTEROLEMIA 03/12/2007   08/10/15 cholesterol 119, triglycerides 143, HDL 51, LDL 39   . Lumbar back pain   . MACULAR DEGENERATION 01/26/2008   Qualifier: Diagnosis of  By: Lenna Gilford MD, Deborra Medina   . Osteoarthritis 03/12/2007  . Osteoporosis 03/12/2007   Qualifier: Diagnosis of  By: Julien Girt CMA, Marliss Czar  06/15/14 dc Vit D 1000u po daily per pharm.    . Phlebitis and thrombophlebitis of other deep vessels of lower extremities 07/10/2010  . S/P balloon dilatation of esophageal stricture 07/28/2013   07/26/13. Dr. Henrene Pastor. EGD foreign removal and Balloon dilation of esophagus. Esophageal dysmotility, suspected achalasia, dilation at the GE junction to 74mm. Candida esophagitis.    . SYNCOPE 07/30/2009   Occurred 2011. Hospitalized. Felt due to dehydration.    . TRANSIENT ISCHEMIC ATTACKS, HX OF 03/12/2007   Qualifier: Diagnosis of  By: Julien Girt CMA, Marliss Czar     Past Surgical History:  Procedure Laterality Date  . ABDOMINAL HYSTERECTOMY     and bso  . APPENDECTOMY    . BOTOX INJECTION N/A 09/18/2013   Procedure: BOTOX INJECTION;  Surgeon: Lafayette Dragon, MD;  Location: [facility];  Service: Endoscopy;  Laterality: N/A;  . BOTOX INJECTION N/A 10/16/2014   Procedure: BOTOX INJECTION;  Surgeon: Inda Castle, MD;  Location: [facility];  Service: Endoscopy;  Laterality: N/A;  . BOTOX INJECTION N/A 05/18/2016   Procedure: BOTOX INJECTION;  Surgeon: Milus Banister, MD;  Location: [facility];  Service: Endoscopy;  Laterality: N/A;  . CATARACT EXTRACTION W/ INTRAOCULAR LENS  IMPLANT, BILATERAL    . CHOLECYSTECTOMY    . ESOPHAGEAL MANOMETRY N/A 08/25/2013   Procedure: ESOPHAGEAL MANOMETRY (EM);  Surgeon: Jerene Bears, MD;  Location: [facility];  Service: Gastroenterology;  Laterality: N/A;  . ESOPHAGOGASTRODUODENOSCOPY N/A 07/26/2013   Procedure: ESOPHAGOGASTRODUODENOSCOPY (EGD);  Surgeon: Irene Shipper, MD;  [facility];  Service: Endoscopy;  Laterality: N/A;  . ESOPHAGOGASTRODUODENOSCOPY N/A 10/16/2014   Procedure: ESOPHAGOGASTRODUODENOSCOPY (EGD);  Surgeon: Inda Castle, MD;  Location: [facility];  Service: Endoscopy;  Laterality: N/A;  with botox  . ESOPHAGOGASTRODUODENOSCOPY (EGD) WITH ESOPHAGEAL DILATION N/A 11/25/2012   Procedure: ESOPHAGOGASTRODUODENOSCOPY (EGD) WITH ESOPHAGEAL DILATION;  Surgeon: Jerene Bears, MD;  Location: [facility];  Service: Gastroenterology;  Laterality: N/A;  . ESOPHAGOGASTRODUODENOSCOPY (EGD) WITH PROPOFOL N/A 09/18/2013   Procedure: ESOPHAGOGASTRODUODENOSCOPY (EGD) WITH PROPOFOL;  Surgeon: Lafayette Dragon, MD;  Location: [facility];  Service: Endoscopy;  Laterality: N/A;  . ESOPHAGOGASTRODUODENOSCOPY (EGD) WITH PROPOFOL N/A 05/18/2016   Procedure: ESOPHAGOGASTRODUODENOSCOPY (EGD) WITH PROPOFOL;  Surgeon: Milus Banister, MD;  Location: [facility];  Service: Endoscopy;  Laterality: N/A;  . HIP ARTHROPLASTY Right 09/09/2012   Procedure: ARTHROPLASTY BIPOLAR HIP;  Surgeon: Mauri Pole, MD;  Location: [facility];  Service: Orthopedics;  Laterality: Right;  . TAH and BSO  08/21/1995    Allergies  Allergen Reactions  . Sulfamethoxazole Rash    Outpatient Encounter Medications as of 11/08/2017  Medication  Sig  . acetaminophen (TYLENOL) 325 MG tablet Take 650 mg by mouth every 4 (four) hours as needed for moderate pain.   Marland Kitchen albuterol (PROVENTIL HFA;VENTOLIN HFA) 108 (90 BASE) MCG/ACT inhaler Inhale 2 puffs into the lungs every 6 (six) hours as needed for wheezing or shortness of breath.  Marland Kitchen atorvastatin (LIPITOR) 10 MG tablet Take 10 mg by mouth daily.  . CHLORHEXIDINE GLUCONATE, BULK, SOLN Take 5 mLs by mouth daily. 5 ml swish and spit for 30 seconds every day indefinitely   . cholecalciferol (VITAMIN D) 1000 units tablet Take 1,000 Units by mouth daily.  . fluticasone furoate-vilanterol (BREO ELLIPTA) 100-25 MCG/INH AEPB Inhale 1 puff into the  lungs daily. One puff inhale once a day, rinse mouth after each use.   . Humidifier MISC Please use humidifier to prevent excessive dryness.  Marland Kitchen HYDROcodone-acetaminophen (NORCO) 10-325 MG tablet Take 1 tablet by mouth 2 (two) times daily. Patient may take 1  tablet once daily as needed.  . hydroxyurea (HYDREA) 500 MG capsule Take 500-1,000 mg by mouth See admin instructions. Takes 1000 mg on Monday only Takes 500 mg on Sunday, Wednesday and Friday only  . memantine (NAMENDA) 10 MG tablet Take 10 mg by mouth 2 (two) times daily.   . metoprolol tartrate (LOPRESSOR) 25 MG tablet Take 25 mg by mouth 2 (two) times daily.  . montelukast (SINGULAIR) 10 MG tablet Take 10 mg by mouth at bedtime.  . Multiple Vitamins-Minerals (PRESERVISION AREDS 2) CAPS Take 1 capsule by mouth 2 (two) times daily.  Marland Kitchen NIFEdipine (PROCARDIA) 10 MG capsule Take 10 mg by mouth 3 (three) times daily. Squeeze contents of one capsule uner tongue 15-30 minutes before meals 3 times a day   . nitroGLYCERIN (NITROSTAT) 0.4 MG SL tablet Place 0.4 mg under the tongue every 5 (five) minutes as needed for chest pain.  Marland Kitchen omeprazole (PRILOSEC) 20 MG capsule Take 20 mg by mouth at bedtime.   . Rivaroxaban (XARELTO) 15 MG TABS tablet Take 15 mg by mouth every evening. 5pm  . sodium fluoride (DENTA 5000 PLUS) 1.1 % CREA dental cream Place 1 application onto teeth at bedtime.  . [DISCONTINUED] HYDROcodone-acetaminophen (NORCO) 10-325 MG tablet Take 1 tablet by mouth 2 (two) times daily. (Patient taking differently: Take 1 tablet by mouth. )   No facility-administered encounter medications on file as of 11/08/2017.    ROS was provided with assistance of staff Review of Systems  Constitutional: Negative for activity change, appetite change, chills, diaphoresis, fatigue and fever.  HENT: Positive for hearing loss and trouble swallowing. Negative for congestion.   Respiratory: Negative for cough, shortness of breath and wheezing.     Cardiovascular: Negative for chest pain, palpitations and leg swelling.  Gastrointestinal: Negative for abdominal distention, abdominal pain, constipation, diarrhea, nausea and vomiting.  Genitourinary: Negative for difficulty urinating, dysuria and urgency.  Musculoskeletal: Positive for arthralgias, back pain and gait problem.  Skin: Negative for color change and pallor.  Neurological: Negative for dizziness, seizures, speech difficulty and headaches.       Memory lapses.   Psychiatric/Behavioral: Positive for confusion. Negative for agitation, behavioral problems and sleep disturbance. The patient is not nervous/anxious.     Immunization History  Administered Date(s) Administered  . Influenza Split 11/16/2010, 12/13/2011  . Influenza Whole 11/27/2009  . Influenza-Unspecified 12/31/2013, 11/18/2014, 12/14/2015, 12/11/2016  . PPD Test 09/11/2012  . Pneumococcal Polysaccharide-23 02/28/1988   Pertinent  Health Maintenance Due  Topic Date Due  . PNA vac Low Risk  Adult (2 of 2 - PCV13) 02/27/1989  . INFLUENZA VACCINE  09/27/2017  . DEXA SCAN  Completed   Fall Risk  02/08/2017 02/08/2017 10/04/2015 12/28/2014 10/23/2014  Falls in the past year? No Exclusion - non ambulatory No Yes Yes  Number falls in past yr: - - - 1 1  Injury with Fall? - - - No No  Risk for fall due to : - Impaired balance/gait;Impaired vision Impaired mobility;Impaired balance/gait History of fall(s);Impaired balance/gait;Impaired mobility Impaired mobility  Follow up - - - Falls prevention discussed Falls evaluation completed   Functional Status Survey:    Vitals:   11/08/17 1054  BP: 126/64  Pulse: 68  Resp: 18  Temp: 98.7 F (37.1 C)  SpO2: 95%  Weight: 97 lb 8 oz (44.2 kg)  Height: 5\' 1"  (1.549 m)   Body mass index is 18.42 kg/m. Physical Exam  Constitutional: She appears well-developed and well-nourished.  HENT:  Head: Normocephalic and atraumatic.  Eyes: Pupils are equal, round, and reactive  to light. EOM are normal.  Neck: Normal range of motion. Neck supple. No JVD present. No thyromegaly present.  Cardiovascular: Normal rate and regular rhythm.  No murmur heard. Pulmonary/Chest: Effort normal. She has no wheezes. She has no rales.  Decreased air entry to R+L lungs.   Abdominal: Soft. Bowel sounds are normal. She exhibits no distension. There is no tenderness. There is no rebound and no guarding.  Musculoskeletal: She exhibits no edema.  Needs assistance for transfer. W/c for mobility.   Neurological: She is alert. No cranial nerve deficit. She exhibits normal muscle tone. Coordination normal.  Oriented to persona and place.   Skin: Skin is warm and dry.  Psychiatric: She has a normal mood and affect. Her behavior is normal.    Labs reviewed: Recent Labs    01/30/17 09/18/17  NA 139 142  K 4.3 3.8  CL  --  104  CO2  --  30  BUN 19 21  CREATININE 0.8 0.8  CALCIUM  --  9.4  MG  --  1.9   Recent Labs    01/30/17 09/18/17  AST 17 19  ALT 11 13  ALKPHOS 68 71  PROT  --  6.8  ALBUMIN  --  4.2   Recent Labs    07/03/17 08/28/17 10/29/17  WBC 6.9 6.7 6.3  NEUTROABS 4,264  --   --   HGB 12.3 12.7 12.3  HCT 36 38 36  PLT 269 318 264   Lab Results  Component Value Date   TSH 2.00 09/21/2016   Lab Results  Component Value Date   HGBA1C 5.5 10/16/2014   Lab Results  Component Value Date   CHOL 143 09/18/2017   HDL 50 09/18/2017   LDLCALC 70 09/18/2017   LDLDIRECT 152.3 06/04/2009   TRIG 147 09/18/2017   CHOLHDL 3 09/13/2011    Significant Diagnostic Results in last 30 days:  No results found.  Assessment/Plan Hypertensive heart disease Blood pressure is controlled, continue Metoprolol 25mg  bid.   COPD with asthma ([place]) Stable, continue Montelukast 10mg  qd, Breo Ellipta daily, prn Albuterol q6h.  Benign esophageal stricture s/p dilatation, stable, continue Omeprazole 20mg  qd, Nifedipine 10mg  tid.  Long term current use of anticoagulant  therapy history of DVTs, continue  Xarelto 15mg  qd for long term.   BACK PAIN, LUMBAR Lower back pain is managed, continue  prn Tylenol, Norco 10/325mg  bid, and w/c.       Family/ staff Communication: plan of care  reviewed with the patient and charge nurse.   Labs/tests ordered:  none  Time spend 25 minutes.

## 2017-11-08 NOTE — Assessment & Plan Note (Signed)
Blood pressure is controlled, continue Metoprolol 25mg bid.  

## 2017-11-08 NOTE — Assessment & Plan Note (Signed)
history of DVTs, continue  Xarelto 15mg  qd for long term.

## 2017-11-13 DIAGNOSIS — J449 Chronic obstructive pulmonary disease, unspecified: Secondary | ICD-10-CM | POA: Diagnosis not present

## 2017-11-13 DIAGNOSIS — M5440 Lumbago with sciatica, unspecified side: Secondary | ICD-10-CM | POA: Diagnosis not present

## 2017-11-13 DIAGNOSIS — M810 Age-related osteoporosis without current pathological fracture: Secondary | ICD-10-CM | POA: Diagnosis not present

## 2017-11-13 DIAGNOSIS — E43 Unspecified severe protein-calorie malnutrition: Secondary | ICD-10-CM | POA: Diagnosis not present

## 2017-11-13 DIAGNOSIS — Z86718 Personal history of other venous thrombosis and embolism: Secondary | ICD-10-CM | POA: Diagnosis not present

## 2017-11-13 DIAGNOSIS — H04129 Dry eye syndrome of unspecified lacrimal gland: Secondary | ICD-10-CM | POA: Diagnosis not present

## 2017-11-13 DIAGNOSIS — G309 Alzheimer's disease, unspecified: Secondary | ICD-10-CM | POA: Diagnosis not present

## 2017-11-13 DIAGNOSIS — K224 Dyskinesia of esophagus: Secondary | ICD-10-CM | POA: Diagnosis not present

## 2017-11-13 DIAGNOSIS — K219 Gastro-esophageal reflux disease without esophagitis: Secondary | ICD-10-CM | POA: Diagnosis not present

## 2017-11-13 DIAGNOSIS — R293 Abnormal posture: Secondary | ICD-10-CM | POA: Diagnosis not present

## 2017-11-13 DIAGNOSIS — K5793 Diverticulitis of intestine, part unspecified, without perforation or abscess with bleeding: Secondary | ICD-10-CM | POA: Diagnosis not present

## 2017-11-13 DIAGNOSIS — G8929 Other chronic pain: Secondary | ICD-10-CM | POA: Diagnosis not present

## 2017-11-13 DIAGNOSIS — F329 Major depressive disorder, single episode, unspecified: Secondary | ICD-10-CM | POA: Diagnosis not present

## 2017-11-13 DIAGNOSIS — J209 Acute bronchitis, unspecified: Secondary | ICD-10-CM | POA: Diagnosis not present

## 2017-11-13 DIAGNOSIS — F0280 Dementia in other diseases classified elsewhere without behavioral disturbance: Secondary | ICD-10-CM | POA: Diagnosis not present

## 2017-11-13 DIAGNOSIS — M6281 Muscle weakness (generalized): Secondary | ICD-10-CM | POA: Diagnosis not present

## 2017-11-16 DIAGNOSIS — R293 Abnormal posture: Secondary | ICD-10-CM | POA: Diagnosis not present

## 2017-11-20 ENCOUNTER — Other Ambulatory Visit: Payer: Self-pay | Admitting: Oncology

## 2017-11-20 ENCOUNTER — Non-Acute Institutional Stay (SKILLED_NURSING_FACILITY): Payer: Medicare Other | Admitting: Nurse Practitioner

## 2017-11-20 DIAGNOSIS — R4182 Altered mental status, unspecified: Secondary | ICD-10-CM | POA: Diagnosis not present

## 2017-11-20 DIAGNOSIS — G301 Alzheimer's disease with late onset: Secondary | ICD-10-CM | POA: Diagnosis not present

## 2017-11-20 DIAGNOSIS — F329 Major depressive disorder, single episode, unspecified: Secondary | ICD-10-CM

## 2017-11-20 DIAGNOSIS — F32A Depression, unspecified: Secondary | ICD-10-CM

## 2017-11-20 DIAGNOSIS — F0280 Dementia in other diseases classified elsewhere without behavioral disturbance: Secondary | ICD-10-CM

## 2017-11-20 DIAGNOSIS — D473 Essential (hemorrhagic) thrombocythemia: Secondary | ICD-10-CM

## 2017-11-20 DIAGNOSIS — F419 Anxiety disorder, unspecified: Secondary | ICD-10-CM

## 2017-11-20 NOTE — Assessment & Plan Note (Signed)
Reported increased confusion x 1 day, continue [facility] for safety and care assistance, continue Memantine for memory, update CBC/diff, CMP to evaluate

## 2017-11-20 NOTE — Progress Notes (Signed)
Location:  [place] Room Number: 35 Place of Service:  SNF (31) Provider:  Kamori Kitchens., ManXie  NP  Blanchie Serve, MD  Patient Care Team: Blanchie Serve, MD as PCP - General (Internal Medicine) Annia Belt, MD as Consulting Physician (Hematology and Oncology) Suella Broad, MD as Consulting Physician (Physical Medicine and Rehabilitation) Noralee Space, MD (Pulmonary Disease) [place] Lauriana Denes X, NP as Nurse Practitioner (Nurse Practitioner) Inda Castle, MD (Inactive) as Consulting Physician (Gastroenterology)  Extended Emergency Contact Information Primary Emergency Contact: Ritzel,Gene Address: [address]          [place], [place] [address] [place] Phone: [phone] Work Phone: [phone] Mobile Phone: [phone] Relation: Son Secondary Emergency Contact: [name], [place] Phone: [phone] Relation: Son  Code Status:  DNR Goals of care: Advanced Directive information Advanced Directives 11/08/2017  Does Patient Have a Medical Advance Directive? Yes  Type of Paramedic of [place];Living will;Out of facility DNR (pink MOST or yellow form)  Does patient want to make changes to medical advance directive? No - Patient declined  Copy of [place] in Chart? Yes  Pre-existing out of facility DNR order (yellow form or pink MOST form) Yellow form placed in chart (order not valid for inpatient use)     Chief Complaint  Patient presents with  . Acute Visit    increased confusion and anxiety    HPI:  Pt is a 84 y.o. [place] seen today for an acute visit for increased confusion and anxiety x 1 day. HPI was provided with assistance of staff. No noted new focal neurological symptoms. She denied headache, dizziness, change of vision, chest pain/pressure, palpitation, nausea, vomiting,  constipation/diarrhea, abd pain/lower back pain. She is afebrile. She resides in [facility] for safety and care assistance, on Memantine 10mg  bid for memory. She is off psychoactive agent for mood.    Past Medical History:  Diagnosis Date  . Abnormal weight gain 05/09/2012  . Achalasia of esophagus 09/18/2013   09/18/13 Nifedipine 10mg  tid ac meals. 11/17/14 [facility] f/u as neeed   . Anxiety and depression 03/12/2012   Increased Sertraline to 50mg  daily 07/16/13 08/26/13 Mirtazapine 7.5mg  qhs  10/12/14 Pharm decreased Sertraline to 25mg .  05/04/15 TSH 2.89 09/15/15 pharm taper off Sertraline.  10/08/15 continue Sertraline 25mg  daily, POA desires. 12/02/15 wbc 6.1, Hgb 12.0, plt 271    . Asthmatic bronchitis   . BACK PAIN, LUMBAR 03/12/2007   Hx of lower back pain, had X-ray of her lumbar spine 09/08/12--spondylosis-better controlled with Hydrocodone/ApAp  10/325mg  ac and hs  01/01/13 lumbar spine inj. Able to ambulate with walker on unit until her fall 06/22/13. 06/25/13 unremarkable. X-ray lumbar spine. 11/10/13 dc Oxycodone prn-not used in 60 days.     . COLONIC POLYPS 03/12/2007   Initially noted April 2002 colonoscopy. Followup colonoscopy 05/31/2006 did not disclose any further polyps.    Marland Kitchen COPD (chronic obstructive pulmonary disease) ([place])   . Coronary artery disease   . Deep vein thrombosis of left lower extremity ([place]) 07/19/2012  . Dementia 09/16/2012   05/04/15 TSH 2.89   . Depression   . Diverticulosis 05/10/2012  . DJD (degenerative joint disease)   . Dry skin dermatitis 11/18/2012  . Edema of leg 07/01/2010  . Epistaxis 01/24/2016   01/21/16: x2, held Xarelto x 1 day, Saline Nasal spray  prn, wbc 9.5, Hgb 12.4, Plt 331, Na 138, K 4.5, Bun 25, creat 0.89 01/22/16 UVO53(66-44)  . Esophageal dysmotility 07/26/2013  . Esophageal dysphagia 05/27/2010   New dx of achalasia 6/ 2015 09/18/13 Nifedipine 10mg  tid ac meals.    . Esophageal stricture   . Essential hypertension, benign 10/30/2013   12/02/15 wbc 6.1, Hgb  12.0, plt 271   . Essential thrombocythemia ([place]) 06/09/2009  . Fall 06/25/2013  . GERD (gastroesophageal reflux disease)   . Hip fracture ([place]) 09/08/2012  . HYPERCHOLESTEROLEMIA 03/12/2007   08/10/15 cholesterol 119, triglycerides 143, HDL 51, LDL 39   . Lumbar back pain   . MACULAR DEGENERATION 01/26/2008   Qualifier: Diagnosis of  By: Lenna [place] MD, Deborra Medina   . Osteoarthritis 03/12/2007  . Osteoporosis 03/12/2007   Qualifier: Diagnosis of  By: Julien Girt CMA, Marliss Czar  06/15/14 dc Vit D 1000u po daily per pharm.    . Phlebitis and thrombophlebitis of other deep vessels of lower extremities 07/10/2010  . S/P balloon dilatation of esophageal stricture 07/28/2013   07/26/13. Dr. Henrene Pastor. EGD foreign removal and Balloon dilation of esophagus. Esophageal dysmotility, suspected achalasia, dilation at the GE junction to 5mm. Candida esophagitis.    . SYNCOPE 07/30/2009   Occurred 2011. Hospitalized. Felt due to dehydration.    . TRANSIENT ISCHEMIC ATTACKS, HX OF 03/12/2007   Qualifier: Diagnosis of  By: Julien Girt CMA, Marliss Czar     Past Surgical History:  Procedure Laterality Date  . ABDOMINAL HYSTERECTOMY     and bso  . APPENDECTOMY    . BOTOX INJECTION N/A 09/18/2013   Procedure: BOTOX INJECTION;  Surgeon: Lafayette Dragon, MD;  Location: [facility] ENDOSCOPY;  Service: Endoscopy;  Laterality: N/A;  . BOTOX INJECTION N/A 10/16/2014   Procedure: BOTOX INJECTION;  Surgeon: Inda Castle, MD;  Location: [facility] ENDOSCOPY;  Service: Endoscopy;  Laterality: N/A;  . BOTOX INJECTION N/A 05/18/2016   Procedure: BOTOX INJECTION;  Surgeon: Milus Banister, MD;  Location: [facility] ENDOS[facility];  Service: Endoscopy;  Laterality: N/A;  . CATARACT EXTRACTION W/ INTRAOCULAR LENS  IMPLANT, BILATERAL    . CHOLECYSTECTOMY    . ESOPHAGEAL MANOMETRY N/A 08/25/2013   Procedure: ESOPHAGEAL MANOMETRY (EM);  Surgeon: Jerene Bears, MD;  Location: [facility] ENDOS[facility];  Service: Gastroenterology;  Laterality: N/A;  . ESOPHAGOGASTRODUODENOSCOPY N/A 07/26/2013   Procedure:  ESOPHAGOGASTRODUODENOSCOPY (EGD);  Surgeon: Irene Shipper, MD;  Location[facility] ENDOSCOPY;  Service: Endoscopy;  Laterality: N/A;  . ESOPHAGOGASTRODUODENOSCOPY N/A 10/16/2014   Procedure: ESOPHAGOGASTRODUODENOSCOPY (EGD);  Surgeon: Inda Castle, MD;  Location: [facility];  Service: Endoscopy;  Laterality: N/A;  with botox  . ESOPHAGOGASTRODUODENOSCOPY (EGD) WITH ESOPHAGEAL DILATION N/A 11/25/2012   Procedure: ESOPHAGOGASTRODUODENOSCOPY (EGD) WITH ESOPHAGEAL DILATION;  Surgeon: Jerene Bears, MD;  Location: [facility];  Service: Gastroenterology;  Laterality: N/A;  . ESOPHAGOGASTRODUODENOSCOPY (EGD) WITH PROPOFOL N/A 09/18/2013   Procedure: ESOPHAGOGASTRODUODENOSCOPY (EGD) WITH PROPOFOL;  Surgeon: Lafayette Dragon, MD;  Location: [facility];  Service: Endoscopy;  Laterality: N/A;  . ESOPHAGOGASTRODUODENOSCOPY (EGD) WITH PROPOFOL N/A 05/18/2016   Procedure: ESOPHAGOGASTRODUODENOSCOPY (EGD) WITH PROPOFOL;  Surgeon: Milus Banister, MD;  Location: [facility];  Service: Endoscopy;  Laterality: N/A;  . HIP ARTHROPLASTY Right 09/09/2012   Procedure: ARTHROPLASTY BIPOLAR HIP;  Surgeon: Mauri Pole, MD;  Location: [facility];  Service: Orthopedics;  Laterality: Right;  . TAH and BSO  08/21/1995    Allergies  Allergen Reactions  . Sulfamethoxazole Rash    Outpatient Encounter Medications as of 11/20/2017  Medication  Sig  . acetaminophen (TYLENOL) 325 MG tablet Take 650 mg by mouth every 4 (four) hours as needed for moderate pain.   Marland Kitchen albuterol (PROVENTIL HFA;VENTOLIN HFA) 108 (90 BASE) MCG/ACT inhaler Inhale 2 puffs into the lungs every 6 (six) hours as needed for wheezing or shortness of breath.  Marland Kitchen atorvastatin (LIPITOR) 10 MG tablet Take 10 mg by mouth daily.  . CHLORHEXIDINE GLUCONATE, BULK, SOLN Take 5 mLs by mouth daily. 5 ml swish and spit for 30 seconds every day indefinitely   . cholecalciferol (VITAMIN D) 1000 units tablet Take 1,000 Units by mouth daily.  . fluticasone furoate-vilanterol (BREO  ELLIPTA) 100-25 MCG/INH AEPB Inhale 1 puff into the lungs daily. One puff inhale once a day, rinse mouth after each use.   . Humidifier MISC Please use humidifier to prevent excessive dryness.  Marland Kitchen HYDROcodone-acetaminophen (NORCO) 10-325 MG tablet Take 1 tablet by mouth 2 (two) times daily. Patient may take 1  tablet once daily as needed.  . hydroxyurea (HYDREA) 500 MG capsule Take 500-1,000 mg by mouth See admin instructions. Takes 1000 mg on Monday only Takes 500 mg on Sunday, Wednesday and Friday only  . memantine (NAMENDA) 10 MG tablet Take 10 mg by mouth 2 (two) times daily.   . metoprolol tartrate (LOPRESSOR) 25 MG tablet Take 25 mg by mouth 2 (two) times daily.  . montelukast (SINGULAIR) 10 MG tablet Take 10 mg by mouth at bedtime.  . Multiple Vitamins-Minerals (PRESERVISION AREDS 2) CAPS Take 1 capsule by mouth 2 (two) times daily.  Marland Kitchen NIFEdipine (PROCARDIA) 10 MG capsule Take 10 mg by mouth 3 (three) times daily. Squeeze contents of one capsule uner tongue 15-30 minutes before meals 3 times a day   . nitroGLYCERIN (NITROSTAT) 0.4 MG SL tablet Place 0.4 mg under the tongue every 5 (five) minutes as needed for chest pain.  Marland Kitchen omeprazole (PRILOSEC) 20 MG capsule Take 20 mg by mouth at bedtime.   . Rivaroxaban (XARELTO) 15 MG TABS tablet Take 15 mg by mouth every evening. 5pm  . sodium chloride (OCEAN) 0.65 % nasal spray Place 2 sprays into the nose 2 (two) times daily.  . sodium fluoride (DENTA 5000 PLUS) 1.1 % CREA dental cream Place 1 application onto teeth at bedtime.   No facility-administered encounter medications on file as of 11/20/2017.    ROS was provided with assistance of staff Review of Systems  Constitutional: Negative for activity change, appetite change, chills, diaphoresis, fatigue and fever.  HENT: Positive for hearing loss. Negative for congestion and voice change.   Eyes: Negative for visual disturbance.  Respiratory: Negative for cough, shortness of breath and wheezing.     Gastrointestinal: Negative for abdominal distention, abdominal pain, constipation, diarrhea, nausea and vomiting.  Genitourinary: Negative for difficulty urinating, dysuria, flank pain, frequency, hematuria and urgency.  Musculoskeletal: Positive for back pain and gait problem.  Skin: Negative for color change and pallor.  Neurological: Negative for dizziness, facial asymmetry, speech difficulty, weakness and headaches.       Dementia  Psychiatric/Behavioral: Positive for confusion. Negative for agitation, behavioral problems and sleep disturbance. The patient is nervous/anxious.     Immunization History  Administered Date(s) Administered  . Influenza Split 11/16/2010, 12/13/2011  . Influenza Whole 11/27/2009  . Influenza-Unspecified 12/31/2013, 11/18/2014, 12/14/2015, 12/11/2016  . PPD Test 09/11/2012  . Pneumococcal Polysaccharide-23 02/28/1988   Pertinent  Health Maintenance Due  Topic Date Due  . PNA vac Low Risk Adult (2 of 2 - PCV13) 02/27/1989  .  INFLUENZA VACCINE  09/27/2017  . DEXA SCAN  Completed   Fall Risk  02/08/2017 02/08/2017 10/04/2015 12/28/2014 10/23/2014  Falls in the past year? No Exclusion - non ambulatory No Yes Yes  Number falls in past yr: - - - 1 1  Injury with Fall? - - - No No  Risk for fall due to : - Impaired balance/gait;Impaired vision Impaired mobility;Impaired balance/gait History of fall(s);Impaired balance/gait;Impaired mobility Impaired mobility  Follow up - - - Falls prevention discussed Falls evaluation completed   Functional Status Survey:    Vitals:   11/20/17 1626  BP: 134/64  Pulse: 78  Resp: 18  Temp: 98.6 F (37 C)  SpO2: 96%  Weight: 97 lb 8 oz (44.2 kg)  Height: 5\' 1"  (1.549 m)   Body mass index is 18.42 kg/m. Physical Exam  Constitutional: She appears well-developed and well-nourished.  HENT:  Head: Normocephalic and atraumatic.  Eyes: Pupils are equal, round, and reactive to light. EOM are normal.  Neck: Normal range of  motion. Neck supple. No JVD present. No thyromegaly present.  Cardiovascular: Normal rate and regular rhythm.  No murmur heard. Pulmonary/Chest: She has no wheezes. She has no rales.  Decreased air entry to both lungs.   Abdominal: Soft. She exhibits no distension. There is no tenderness. There is no rebound and no guarding.  Musculoskeletal: She exhibits no edema.  Needs assistance with transfer. W/c for mobility.   Neurological: She is alert. No cranial nerve deficit. She exhibits normal muscle tone. Coordination normal.  Skin: Skin is warm and dry.  Psychiatric: She has a normal mood and affect.  Upon my visit.     Labs reviewed: Recent Labs    01/30/17 09/18/17  NA 139 142  K 4.3 3.8  CL  --  104  CO2  --  30  BUN 19 21  CREATININE 0.8 0.8  CALCIUM  --  9.4  MG  --  1.9   Recent Labs    01/30/17 09/18/17  AST 17 19  ALT 11 13  ALKPHOS 68 71  PROT  --  6.8  ALBUMIN  --  4.2   Recent Labs    07/03/17 08/28/17 10/29/17  WBC 6.9 6.7 6.3  NEUTROABS 4,264  --   --   HGB 12.3 12.7 12.3  HCT 36 38 36  PLT 269 318 264   Lab Results  Component Value Date   TSH 2.00 09/21/2016   Lab Results  Component Value Date   HGBA1C 5.5 10/16/2014   Lab Results  Component Value Date   CHOL 143 09/18/2017   HDL 50 09/18/2017   LDLCALC Sherri 09/18/2017   LDLDIRECT 152.3 06/04/2009   TRIG 147 09/18/2017   CHOLHDL 3 09/13/2011    Significant Diagnostic Results in last 30 days:  No results found.  Assessment/Plan Altered mental status In setting of Hx of dementia and depress, will update CBC/diff, CMP, observe the patient.   Anxiety and depression Reported anxious mood x 1 day, will observe the patient.   Alzheimer's dementia without behavioral disturbance Reported increased confusion x 1 day, continue SNF [facility] for safety and care assistance, continue Memantine for memory, update CBC/diff, CMP to evaluate     Family/ staff Communication: plan of care reviewed with the  patient and charge nurse  Labs/tests ordered:  CBC/diff, CMP  Time spend 25 minutes.

## 2017-11-20 NOTE — Assessment & Plan Note (Signed)
Reported anxious mood x 1 day, will observe the patient.

## 2017-11-20 NOTE — Assessment & Plan Note (Signed)
In setting of Hx of dementia and depress, will update CBC/diff, CMP, observe the patient.

## 2017-11-21 ENCOUNTER — Encounter: Payer: Self-pay | Admitting: Nurse Practitioner

## 2017-11-21 DIAGNOSIS — R4182 Altered mental status, unspecified: Secondary | ICD-10-CM | POA: Diagnosis not present

## 2017-11-21 LAB — HEPATIC FUNCTION PANEL
ALK PHOS: 66 (ref 25–125)
ALT: 10 (ref 7–35)
AST: 12 — AB (ref 13–35)
Bilirubin, Total: 0.4

## 2017-11-21 LAB — CBC AND DIFFERENTIAL
HCT: 33 — AB (ref 36–46)
Hemoglobin: 11.2 — AB (ref 12.0–16.0)
Platelets: 333 (ref 150–399)
WBC: 5.7

## 2017-11-21 LAB — BASIC METABOLIC PANEL
BUN: 17 (ref 4–21)
CREATININE: 0.7 (ref <=1.1)
GLUCOSE: 86
Potassium: 4 (ref 3.4–5.3)
SODIUM: 145 (ref 137–147)

## 2017-11-22 ENCOUNTER — Encounter: Payer: Self-pay | Admitting: Nurse Practitioner

## 2017-11-29 ENCOUNTER — Encounter: Payer: Self-pay | Admitting: Nurse Practitioner

## 2017-11-29 ENCOUNTER — Non-Acute Institutional Stay (SKILLED_NURSING_FACILITY): Payer: Medicare Other | Admitting: Nurse Practitioner

## 2017-11-29 DIAGNOSIS — F0280 Dementia in other diseases classified elsewhere without behavioral disturbance: Secondary | ICD-10-CM

## 2017-11-29 DIAGNOSIS — G8929 Other chronic pain: Secondary | ICD-10-CM

## 2017-11-29 DIAGNOSIS — F419 Anxiety disorder, unspecified: Secondary | ICD-10-CM | POA: Diagnosis not present

## 2017-11-29 DIAGNOSIS — M5440 Lumbago with sciatica, unspecified side: Secondary | ICD-10-CM

## 2017-11-29 DIAGNOSIS — G301 Alzheimer's disease with late onset: Secondary | ICD-10-CM

## 2017-11-29 DIAGNOSIS — F329 Major depressive disorder, single episode, unspecified: Secondary | ICD-10-CM

## 2017-11-29 DIAGNOSIS — F32A Depression, unspecified: Secondary | ICD-10-CM

## 2017-11-29 NOTE — Progress Notes (Signed)
Location:  [place] Room Number: 35 Place of Service:  SNF (31) Provider:  Marlana Latus  NP  Cassady Turano X, NP  Patient Care Team: Noreene Boreman X, NP as PCP - General (Internal Medicine) Annia Belt, MD as Consulting Physician (Hematology and Oncology) Suella Broad, MD as Consulting Physician (Physical Medicine and Rehabilitation) Noralee Space, MD (Pulmonary Disease) [place] Salia Cangemi X, NP as Nurse Practitioner (Nurse Practitioner) Inda Castle, MD (Inactive) as Consulting Physician (Gastroenterology)  Extended Emergency Contact Information Primary Emergency Contact: Fromme,Gene Address: [address]          [place], [place] [address] [place] Phone: [phone] Work Phone: [phone] Mobile Phone: [phone] Relation: Son Secondary Emergency Contact: [name], Farrell [place] Phone: [phone] Relation: Son  Code Status:  DNR Goals of care: Advanced Directive information Advanced Directives 11/29/2017  Does Patient Have a Medical Advance Directive? Yes  Type of Paramedic of [place];Living will;Out of facility DNR (pink MOST or yellow form)  Does patient want to make changes to medical advance directive? No - Patient declined  Copy of [place] in Chart? Yes  Pre-existing out of facility DNR order (yellow form or pink MOST form) Yellow form placed in chart (order not valid for inpatient use)     Chief Complaint  Patient presents with  . Acute Visit    Depression    HPI:  Pt is a 84 y.o. female seen today for an acute visit for depression, the patient stated she doesn't like to be alone, HPOA son reported over the phone call: mother was  calling him and saying she is so sad, lonely, feeing down, asking him to come/get her  about 3 or more times which is not nomal for her. Hx of depression, off  Sertraline since 12/2016. She stated no trouble falling or staying sleep, appetite remains the same. Denied crying or emotional outburst episodes. She appears flat affect, but conversing and smiled.    11/21/17 Na 145, K 4.0, Bun 17, creat 0.67, TP 5.1, albumin 3.2, wbc 5.7, Hgb 12.2, plt 333, neutrophils 54.9%   Hx of dementia, resides in SNF [facility] for safety and care assistance, on Namenda 10mg  bid for memory.   Hx of chronic lower back pain, the patient stated its tolerable, no pain during my visit today while in w/c then transferred to the chair in her room, taking Norco 10/325mg  bid.  Past Medical History:  Diagnosis Date  . Abnormal weight gain 05/09/2012  . Achalasia of esophagus 09/18/2013   09/18/13 Nifedipine 10mg  tid ac meals. 11/17/14 [facility] f/u as neeed   . Anxiety and depression 03/12/2012   Increased Sertraline to 50mg  daily 07/16/13 08/26/13 Mirtazapine 7.5mg  qhs  10/12/14 Pharm decreased Sertraline to 25mg .  05/04/15 TSH 2.89 09/15/15 pharm taper off Sertraline.  10/08/15 continue Sertraline 25mg  daily, POA desires. 12/02/15 wbc 6.1, Hgb 12.0, plt 271    . Asthmatic bronchitis   . BACK PAIN, LUMBAR 03/12/2007   Hx of lower back pain, had X-ray of her lumbar spine 09/08/12--spondylosis-better controlled with Hydrocodone/ApAp  10/325mg  ac and hs  01/01/13 lumbar spine inj. Able to ambulate with walker on unit until her fall 06/22/13. 06/25/13 unremarkable. X-ray lumbar spine. 11/10/13 dc Oxycodone prn-not used in 60 days.     . COLONIC POLYPS 03/12/2007   Initially noted  April 2002 colonoscopy. Followup colonoscopy 05/31/2006 did not disclose any further polyps.    Marland Kitchen COPD (chronic obstructive pulmonary disease) ([place])   . Coronary artery disease   . Deep vein thrombosis of left lower extremity ([place]) 07/19/2012  . Dementia ([place]) 09/16/2012   05/04/15 TSH 2.89   . Depression   . Diverticulosis 05/10/2012  . DJD (degenerative joint disease)   . Dry skin dermatitis 11/18/2012  . Edema of leg 07/01/2010  .  Epistaxis 01/24/2016   01/21/16: x2, held Xarelto x 1 day, Saline Nasal spray prn, wbc 9.5, Hgb 12.4, Plt 331, Na 138, K 4.5, Bun 25, creat 0.89 01/22/16 PPI95(18-84)  . Esophageal dysmotility 07/26/2013  . Esophageal dysphagia 05/27/2010   New dx of achalasia 6/ 2015 09/18/13 Nifedipine 10mg  tid ac meals.    . Esophageal stricture   . Essential hypertension, benign 10/30/2013   12/02/15 wbc 6.1, Hgb 12.0, plt 271   . Essential thrombocythemia ([place]) 06/09/2009  . Fall 06/25/2013  . GERD (gastroesophageal reflux disease)   . Hip fracture ([place]) 09/08/2012  . HYPERCHOLESTEROLEMIA 03/12/2007   08/10/15 cholesterol 119, triglycerides 143, HDL 51, LDL 39   . Lumbar back pain   . MACULAR DEGENERATION 01/26/2008   Qualifier: Diagnosis of  By: Lenna Gilford MD, Deborra Medina   . Osteoarthritis 03/12/2007  . Osteoporosis 03/12/2007   Qualifier: Diagnosis of  By: Julien Girt CMA, Marliss Czar  06/15/14 dc Vit D 1000u po daily per pharm.    . Phlebitis and thrombophlebitis of other deep vessels of lower extremities 07/10/2010  . S/P balloon dilatation of esophageal stricture 07/28/2013   07/26/13. Dr. Henrene Pastor. EGD foreign removal and Balloon dilation of esophagus. Esophageal dysmotility, suspected achalasia, dilation at the GE junction to 51mm. Candida esophagitis.    . SYNCOPE 07/30/2009   Occurred 2011. Hospitalized. Felt due to dehydration.    . TRANSIENT ISCHEMIC ATTACKS, HX OF 03/12/2007   Qualifier: Diagnosis of  By: Julien Girt CMA, Marliss Czar     Past Surgical History:  Procedure Laterality Date  . ABDOMINAL HYSTERECTOMY     and bso  . APPENDECTOMY    . BOTOX INJECTION N/A 09/18/2013   Procedure: BOTOX INJECTION;  Surgeon: Lafayette Dragon, MD;  Location: [facility];  Service: Endoscopy;  Laterality: N/A;  . BOTOX INJECTION N/A 10/16/2014   Procedure: BOTOX INJECTION;  Surgeon: Inda Castle, MD;  Location: [facility];  Service: Endoscopy;  Laterality: N/A;  . BOTOX INJECTION N/A 05/18/2016   Procedure: BOTOX INJECTION;  Surgeon: Milus Banister, MD;  Location: [facility];  Service: Endoscopy;  Laterality: N/A;  . CATARACT EXTRACTION W/ INTRAOCULAR LENS  IMPLANT, BILATERAL    . CHOLECYSTECTOMY    . ESOPHAGEAL MANOMETRY N/A 08/25/2013   Procedure: ESOPHAGEAL MANOMETRY (EM);  Surgeon: Jerene Bears, MD;  Location: [facility];  Service: Gastroenterology;  Laterality: N/A;  . ESOPHAGOGASTRODUODENOSCOPY N/A 07/26/2013   Procedure: ESOPHAGOGASTRODUODENOSCOPY (EGD);  Surgeon: Irene Shipper, MD;  Location: [facility];  Service: Endoscopy;  Laterality: N/A;  . ESOPHAGOGASTRODUODENOSCOPY N/A 10/16/2014   Procedure: ESOPHAGOGASTRODUODENOSCOPY (EGD);  Surgeon: Inda Castle, MD;  Location: [facility];  Service: Endoscopy;  Laterality: N/A;  with botox  . ESOPHAGOGASTRODUODENOSCOPY (EGD) WITH ESOPHAGEAL DILATION N/A 11/25/2012   Procedure: ESOPHAGOGASTRODUODENOSCOPY (EGD) WITH ESOPHAGEAL DILATION;  Surgeon: Jerene Bears, MD;  Location: [facility];  Service: Gastroenterology;  Laterality: N/A;  . ESOPHAGOGASTRODUODENOSCOPY (EGD) WITH PROPOFOL N/A 09/18/2013   Procedure: ESOPHAGOGASTRODUODENOSCOPY (EGD) WITH PROPOFOL;  Surgeon: Lafayette Dragon, MD;  Location: [facility];  Service: Endoscopy;  Laterality: N/A;  . ESOPHAGOGASTRODUODENOSCOPY (EGD) WITH PROPOFOL N/A 05/18/2016   Procedure: ESOPHAGOGASTRODUODENOSCOPY (EGD) WITH PROPOFOL;  Surgeon: Milus Banister, MD;  Location: [facility];  Service: Endoscopy;  Laterality: N/A;  . HIP ARTHROPLASTY Right 09/09/2012   Procedure: ARTHROPLASTY BIPOLAR HIP;  Surgeon: Mauri Pole, MD;  Location: [facility];  Service: Orthopedics;  Laterality: Right;  . TAH and BSO  08/21/1995    Allergies  Allergen Reactions  . Sulfamethoxazole Rash    Outpatient Encounter Medications as of 11/29/2017  Medication Sig  . acetaminophen (TYLENOL) 325 MG tablet Take 650 mg by mouth every 4 (four) hours as needed for moderate pain.   Marland Kitchen albuterol (PROVENTIL HFA;VENTOLIN HFA) 108 (90 BASE) MCG/ACT inhaler Inhale 2  puffs into the lungs every 6 (six) hours as needed for wheezing or shortness of breath.  Marland Kitchen atorvastatin (LIPITOR) 10 MG tablet Take 10 mg by mouth daily.  . CHLORHEXIDINE GLUCONATE, BULK, SOLN Take 5 mLs by mouth daily. 5 ml swish and spit for 30 seconds every day indefinitely   . cholecalciferol (VITAMIN D) 1000 units tablet Take 1,000 Units by mouth daily.  . fluticasone furoate-vilanterol (BREO ELLIPTA) 100-25 MCG/INH AEPB Inhale 1 puff into the lungs daily. One puff inhale once a day, rinse mouth after each use.   . Humidifier MISC Please use humidifier to prevent excessive dryness.  Marland Kitchen HYDROcodone-acetaminophen (NORCO) 10-325 MG tablet Take 1 tablet by mouth 2 (two) times daily. Patient may take 1  tablet once daily as needed.  . hydroxyurea (HYDREA) 500 MG capsule Take 500-1,000 mg by mouth See admin instructions. Takes 1000 mg on Monday only Takes 500 mg on Sunday, Wednesday and Friday only  . memantine (NAMENDA) 10 MG tablet Take 10 mg by mouth 2 (two) times daily.   . metoprolol tartrate (LOPRESSOR) 25 MG tablet Take 25 mg by mouth 2 (two) times daily.  . montelukast (SINGULAIR) 10 MG tablet Take 10 mg by mouth at bedtime.  . Multiple Vitamins-Minerals (PRESERVISION AREDS 2) CAPS Take 1 capsule by mouth 2 (two) times daily.  Marland Kitchen NIFEdipine (PROCARDIA) 10 MG capsule Take 10 mg by mouth 3 (three) times daily. Squeeze contents of one capsule uner tongue 15-30 minutes before meals 3 times a day   . nitroGLYCERIN (NITROSTAT) 0.4 MG SL tablet Place 0.4 mg under the tongue every 5 (five) minutes as needed for chest pain.  Marland Kitchen omeprazole (PRILOSEC) 20 MG capsule Take 20 mg by mouth at bedtime.   . Rivaroxaban (XARELTO) 15 MG TABS tablet Take 15 mg by mouth every evening. 5pm  . sodium chloride (OCEAN) 0.65 % nasal spray Place 2 sprays into the nose 2 (two) times daily.  . sodium fluoride (DENTA 5000 PLUS) 1.1 % CREA dental cream Place 1 application onto teeth at bedtime.   No facility-administered  encounter medications on file as of 11/29/2017.    ROS was provided with assistance of staff Review of Systems  Constitutional: Negative for activity change, appetite change, chills, diaphoresis, fatigue, fever and unexpected weight change.  HENT: Positive for hearing loss. Negative for congestion and voice change.   Respiratory: Negative for cough, shortness of breath and wheezing.   Cardiovascular: Negative for chest pain, palpitations and leg swelling.  Gastrointestinal: Negative for abdominal distention, abdominal pain, constipation, diarrhea, nausea and vomiting.  Genitourinary: Negative for difficulty urinating, dysuria and urgency.  Musculoskeletal: Positive for arthralgias, back pain and gait problem.  Skin: Negative for color change and pallor.  Neurological: Negative for  dizziness, speech difficulty and headaches.       Dementia  Psychiatric/Behavioral: Positive for confusion. Negative for agitation, behavioral problems, hallucinations and sleep disturbance. The patient is nervous/anxious.     Immunization History  Administered Date(s) Administered  . Influenza Split 11/16/2010, 12/13/2011  . Influenza Whole 11/27/2009  . Influenza-Unspecified 12/31/2013, 11/18/2014, 12/14/2015, 12/11/2016  . PPD Test 09/11/2012  . Pneumococcal Polysaccharide-23 02/28/1988   Pertinent  Health Maintenance Due  Topic Date Due  . PNA vac Low Risk Adult (2 of 2 - PCV13) 02/27/1989  . INFLUENZA VACCINE  09/27/2017  . DEXA SCAN  Completed   Fall Risk  02/08/2017 02/08/2017 10/04/2015 12/28/2014 10/23/2014  Falls in the past year? No Exclusion - non ambulatory No Yes Yes  Number falls in past yr: - - - 1 1  Injury with Fall? - - - No No  Risk for fall due to : - Impaired balance/gait;Impaired vision Impaired mobility;Impaired balance/gait History of fall(s);Impaired balance/gait;Impaired mobility Impaired mobility  Follow up - - - Falls prevention discussed Falls evaluation completed   Functional  Status Survey:    Vitals:   11/29/17 1049  BP: 122/60  Pulse: 74  Resp: 18  Temp: 97.6 F (36.4 C)  Weight: 98 lb 1.6 oz (44.5 kg)  Height: 5\' 1"  (1.549 m)   Body mass index is 18.54 kg/m. Physical Exam  Constitutional: She appears well-developed and well-nourished.  HENT:  Head: Normocephalic and atraumatic.  Eyes: Pupils are equal, round, and reactive to light. EOM are normal.  Neck: Normal range of motion. Neck supple. No JVD present. No thyromegaly present.  Cardiovascular: Normal rate and regular rhythm.  No murmur heard. Pulmonary/Chest: Effort normal. She has no wheezes. She has no rales.  Decreased air entry R+L lungs. Fine crackles posterior lung base  Abdominal: Soft. Bowel sounds are normal. She exhibits no distension. There is no tenderness. There is no rebound and no guarding.  Musculoskeletal: She exhibits no edema.  Self transfer. W/c for mobility.   Neurological: She is alert. No cranial nerve deficit. She exhibits normal muscle tone. Coordination normal.  Oriented to person and place.   Skin: Skin is warm and dry.  Psychiatric: She has a normal mood and affect. Her behavior is normal.  Flat affect    Labs reviewed: Recent Labs    01/30/17 09/18/17  NA 139 142  K 4.3 3.8  CL  --  104  CO2  --  30  BUN 19 21  CREATININE 0.8 0.8  CALCIUM  --  9.4  MG  --  1.9   Recent Labs    01/30/17 09/18/17  AST 17 19  ALT 11 13  ALKPHOS 68 71  PROT  --  6.8  ALBUMIN  --  4.2   Recent Labs    07/03/17 08/28/17 10/29/17  WBC 6.9 6.7 6.3  NEUTROABS 4,264  --   --   HGB 12.3 12.7 12.3  HCT 36 38 36  PLT 269 318 264   Lab Results  Component Value Date   TSH 2.00 09/21/2016   Lab Results  Component Value Date   HGBA1C 5.5 10/16/2014   Lab Results  Component Value Date   CHOL 143 09/18/2017   HDL 50 09/18/2017   LDLCALC 70 09/18/2017   LDLDIRECT 152.3 06/04/2009   TRIG 147 09/18/2017   CHOLHDL 3 09/13/2011    Significant Diagnostic Results in  last 30 days:  No results found.  Assessment/Plan Chronic depression    Anxiety  and depression 11/21/17 Na 145, K 4.0, Bun 17, creat 0.67, TP 5.1, albumin 3.2, wbc 5.7, Hgb 12.2, plt 333, neutrophils 54.9%. 11/29/17 will try Sertraline 25mg  qd po, observe the patient.    Alzheimer's dementia without behavioral disturbance Continue SNF [facility] for safety and care assistance, continue Memantine 10mg  bid for memory.   BACK PAIN, LUMBAR Stable, continue Norco 10/325mg  bid po, w/c for mobility.      Family/ staff Communication: plan of care reviewed with the patient and charge nurse.   Labs/tests ordered:  none  Time spend 25 minutes.

## 2017-11-29 NOTE — Assessment & Plan Note (Signed)
Stable, continue Norco 10/325mg  bid po, w/c for mobility.

## 2017-11-29 NOTE — Assessment & Plan Note (Signed)
Continue SNF [facility] for safety and care assistance, continue Memantine 10mg bid for memory 

## 2017-11-29 NOTE — Assessment & Plan Note (Signed)
11/21/17 Na 145, K 4.0, Bun 17, creat 0.67, TP 5.1, albumin 3.2, wbc 5.7, Hgb 12.2, plt 333, neutrophils 54.9%. 11/29/17 will try Sertraline 25mg  qd po, observe the patient.

## 2017-12-05 ENCOUNTER — Telehealth: Payer: Self-pay

## 2017-12-05 ENCOUNTER — Non-Acute Institutional Stay (SKILLED_NURSING_FACILITY): Payer: Medicare Other | Admitting: Family Medicine

## 2017-12-05 ENCOUNTER — Encounter: Payer: Self-pay | Admitting: Family Medicine

## 2017-12-05 DIAGNOSIS — I25119 Atherosclerotic heart disease of native coronary artery with unspecified angina pectoris: Secondary | ICD-10-CM

## 2017-12-05 DIAGNOSIS — I10 Essential (primary) hypertension: Secondary | ICD-10-CM | POA: Diagnosis not present

## 2017-12-05 DIAGNOSIS — F329 Major depressive disorder, single episode, unspecified: Secondary | ICD-10-CM

## 2017-12-05 DIAGNOSIS — J449 Chronic obstructive pulmonary disease, unspecified: Secondary | ICD-10-CM | POA: Diagnosis not present

## 2017-12-05 DIAGNOSIS — I82402 Acute embolism and thrombosis of unspecified deep veins of left lower extremity: Secondary | ICD-10-CM | POA: Diagnosis not present

## 2017-12-05 DIAGNOSIS — K224 Dyskinesia of esophagus: Secondary | ICD-10-CM

## 2017-12-05 DIAGNOSIS — F32A Depression, unspecified: Secondary | ICD-10-CM

## 2017-12-05 NOTE — Telephone Encounter (Signed)
-----   Message from Milus Banister, MD sent at 12/05/2017  7:26 AM EDT ----- Regarding: RE: Question for you Glendell Docker, Thanks. Makes sense to bring her in to talk about her swallowing.  Fount Bahe, She needs next available OV with me for achalasia.  Thanks  DJ   ----- Message ----- From: Gatha Mayer, MD Sent: 12/04/2017   5:20 PM EDT To: Milus Banister, MD Subject: Question for you                               You probably do not remember this lady, she has had Botox from several of [place] you were the last.  Her son is my patient.  She is elderly and hides her symptoms and so than when she is symptomatic things are rather urgent and it concerns him and he worries about her having an office visit in delay to getting Botox.  I told him to query the nurses where she lives weekly to find out what is going on, apparently his mother hides when she regurgitates and when the nurses eventually reported that she is in a more severe situation if you well.  It might be worthwhile, and I did not commit you, to ask her to come in for a follow-up to see if it makes sense to schedule an EGD with Botox for her.  I think he would appreciate it.  Thanks

## 2017-12-05 NOTE — Progress Notes (Addendum)
Provider:  Alain Honey, MD Location:  [place] Room Number: 74 Place of Service:  SNF (31)  PCP: [name], [name], NP Patient Care Team: [name], [name], NP as PCP - General (Internal Medicine) Annia Belt, MD as Consulting Physician (Hematology and Oncology) Suella Broad, MD as Consulting Physician (Physical Medicine and Rehabilitation) Noralee Space, MD (Pulmonary Disease) [place] [name], [name], NP as Nurse Practitioner (Nurse Practitioner) Inda Castle, MD (Inactive) as Consulting Physician (Gastroenterology)  Extended Emergency Contact Information Primary Emergency Contact: Balderson,Gene Address: [address]          [place], [place] [address] [place] Phone: [phone] Work Phone: [phone] Mobile Phone: [phone] Relation: Son Secondary Emergency Contact: [name], [place] Phone: [phone] Relation: Son  Code Status: DNR Goals of Care: Advanced Directive information Advanced Directives 11/29/2017  Does Patient Have a Medical Advance Directive? Yes  Type of Paramedic of [place];Living will;Out of facility DNR (pink MOST or yellow form)  Does patient want to make changes to medical advance directive? No - Patient declined  Copy of [place] in Chart? Yes  Pre-existing out of facility DNR order (yellow form or pink MOST form) Yellow form placed in chart (order not valid for inpatient use)      Chief Complaint  Patient presents with  . Medical Management of Chronic Issues    F/u- Alzheimers, anxiety, depression, COPD,    HPI: Patient is a 84 y.o. female seen today for medical management of chronic problems including Alzheimer's disease, COPD, hypertension, and achalasia. I found her seated in her wheelchair in her room.  I had made several other trips to the room but she was out and  about and not available.  She denied any specific complaints today.  I asked her about some of the problems.  She has not had any recent falls and ambulates by wheelchair.  She denied any recent issues with esophageal dysmotility.  There are no symptoms of choking or food sticking.  She does take Procardia. She had also been on Zoloft for depression it was stopped and apparently some family noted to difference and then restarted. She is a 1 person assist. She endorses good appetite, good sleeping.  Past Medical History:  Diagnosis Date  . Abnormal weight gain 05/09/2012  . Achalasia of esophagus 09/18/2013   09/18/13 Nifedipine 10mg  tid ac meals. 11/17/14 [facility] f/u as neeed   . Anxiety and depression 03/12/2012   Increased Sertraline to 50mg  daily 07/16/13 08/26/13 Mirtazapine 7.5mg  qhs  10/12/14 Pharm decreased Sertraline to 25mg .  05/04/15 TSH 2.89 09/15/15 pharm taper off Sertraline.  10/08/15 continue Sertraline 25mg  daily, POA desires. 12/02/15 wbc 6.1, Hgb 12.0, plt 271    . Asthmatic bronchitis   . BACK PAIN, LUMBAR 03/12/2007   Hx of lower back pain, had X-ray of her lumbar spine 09/08/12--spondylosis-better controlled with Hydrocodone/ApAp  10/325mg  ac and hs  01/01/13 lumbar spine inj. Able to ambulate with walker on unit until her fall 06/22/13. 06/25/13 unremarkable. X-ray lumbar spine. 11/10/13 dc Oxycodone prn-not used in 60 days.     . COLONIC POLYPS 03/12/2007   Initially noted April 2002 colonoscopy. Followup colonoscopy 05/31/2006 did not disclose any further polyps.    Marland Kitchen COPD (chronic obstructive pulmonary disease) ([place])   . Coronary artery disease   . Deep  vein thrombosis of left lower extremity ([place]) 07/19/2012  . Dementia ([place]) 09/16/2012   05/04/15 TSH 2.89   . Depression   . Diverticulosis 05/10/2012  . DJD (degenerative joint disease)   . Dry skin dermatitis 11/18/2012  . Edema of leg 07/01/2010  . Epistaxis 01/24/2016   01/21/16: x2, held Xarelto x 1 day, Saline Nasal spray prn, wbc 9.5,  Hgb 12.4, Plt 331, Na 138, K 4.5, Bun 25, creat 0.89 01/22/16 TFT73(22-02)  . Esophageal dysmotility 07/26/2013  . Esophageal dysphagia 05/27/2010   New dx of achalasia 6/ 2015 09/18/13 Nifedipine 10mg  tid ac meals.    . Esophageal stricture   . Essential hypertension, benign 10/30/2013   12/02/15 wbc 6.1, Hgb 12.0, plt 271   . Essential thrombocythemia ([place]) 06/09/2009  . Fall 06/25/2013  . GERD (gastroesophageal reflux disease)   . Hip fracture ([place]) 09/08/2012  . HYPERCHOLESTEROLEMIA 03/12/2007   08/10/15 cholesterol 119, triglycerides 143, HDL 51, LDL 39   . Lumbar back pain   . MACULAR DEGENERATION 01/26/2008   Qualifier: Diagnosis of  By: Lenna Gilford MD, Deborra Medina   . Osteoarthritis 03/12/2007  . Osteoporosis 03/12/2007   Qualifier: Diagnosis of  By: Julien Girt CMA, Marliss Czar  06/15/14 dc Vit D 1000u po daily per pharm.    . Phlebitis and thrombophlebitis of other deep vessels of lower extremities 07/10/2010  . S/P balloon dilatation of esophageal stricture 07/28/2013   07/26/13. Dr. Henrene Pastor. EGD foreign removal and Balloon dilation of esophagus. Esophageal dysmotility, suspected achalasia, dilation at the GE junction to 72mm. Candida esophagitis.    . SYNCOPE 07/30/2009   Occurred 2011. Hospitalized. Felt due to dehydration.    . TRANSIENT ISCHEMIC ATTACKS, HX OF 03/12/2007   Qualifier: Diagnosis of  By: Julien Girt CMA, Marliss Czar     Past Surgical History:  Procedure Laterality Date  . ABDOMINAL HYSTERECTOMY     and bso  . APPENDECTOMY    . BOTOX INJECTION N/A 09/18/2013   Procedure: BOTOX INJECTION;  Surgeon: Lafayette Dragon, MD;  Location: [facility];  Service: Endoscopy;  Laterality: N/A;  . BOTOX INJECTION N/A 10/16/2014   Procedure: BOTOX INJECTION;  Surgeon: Inda Castle, MD;  Location: [facility];  Service: Endoscopy;  Laterality: N/A;  . BOTOX INJECTION N/A 05/18/2016   Procedure: BOTOX INJECTION;  Surgeon: Milus Banister, MD;  Location: [facility];  Service: Endoscopy;  Laterality: N/A;  . CATARACT  EXTRACTION W/ INTRAOCULAR LENS  IMPLANT, BILATERAL    . CHOLECYSTECTOMY    . ESOPHAGEAL MANOMETRY N/A 08/25/2013   Procedure: ESOPHAGEAL MANOMETRY (EM);  Surgeon: Jerene Bears, MD;  [facility]: [facility];  Service: Gastroenterology;  Laterality: N/A;  . ESOPHAGOGASTRODUODENOSCOPY N/A 07/26/2013   Procedure: ESOPHAGOGASTRODUODENOSCOPY (EGD);  Surgeon: Irene Shipper, MD;  Location: [facility] ENDOSCOPY;  Service: Endoscopy;  Laterality: N/A;  . ESOPHAGOGASTRODUODENOSCOPY N/A 10/16/2014   Procedure: ESOPHAGOGASTRODUODENOSCOPY (EGD);  Surgeon: Inda Castle, MD;  Location: [facility];  Service: Endoscopy;  Laterality: N/A;  with botox  . ESOPHAGOGASTRODUODENOSCOPY (EGD) WITH ESOPHAGEAL DILATION N/A 11/25/2012   Procedure: ESOPHAGOGASTRODUODENOSCOPY (EGD) WITH ESOPHAGEAL DILATION;  Surgeon: Jerene Bears, MD;  [facility];  Service: Gastroenterology;  Laterality: N/A;  . ESOPHAGOGASTRODUODENOSCOPY (EGD) WITH PROPOFOL N/A 09/18/2013   Procedure: ESOPHAGOGASTRODUODENOSCOPY (EGD) WITH PROPOFOL;  Surgeon: Lafayette Dragon, MD;  [facility];  Service: Endoscopy;  Laterality: N/A;  . ESOPHAGOGASTRODUODENOSCOPY (EGD) WITH PROPOFOL N/A 05/18/2016   Procedure: ESOPHAGOGASTRODUODENOSCOPY (EGD) WITH PROPOFOL;  Surgeon: Milus Banister, MD;  [facility];  Service: Endoscopy;  Laterality: N/A;  . HIP ARTHROPLASTY Right 09/09/2012   Procedure: ARTHROPLASTY BIPOLAR HIP;  Surgeon: Mauri Pole, MD;  Location: [facility];  Service: Orthopedics;  Laterality: Right;  . TAH and BSO  08/21/1995    reports that she has never smoked. She has never used smokeless tobacco. She reports that she does not drink alcohol or use drugs. Social History   Socioeconomic History  . Marital status: Widowed    Spouse name: Gwyndolyn Saxon  . Number of children: 3  . Years of education: Not on file  . Highest education level: Not on file  Occupational History  . Occupation: retired    Comment: Veterinary surgeon work    Fish farm manager:  RETIRED  Social Needs  . Financial resource strain: Not hard at all  . Food insecurity:    Worry: Patient refused    Inability: Patient refused  . Transportation needs:    Medical: Patient refused    Non-medical: Patient refused  Tobacco Use  . Smoking status: Never Smoker  . Smokeless tobacco: Never Used  Substance and Sexual Activity  . Alcohol use: No    Alcohol/week: 0.0 standard drinks  . Drug use: No  . Sexual activity: Never  Lifestyle  . Physical activity:    Days per week: Patient refused    Minutes per session: Patient refused  . Stress: Not at all  Relationships  . Social connections:    Talks on phone: Never    Gets together: Patient refused    Attends religious service: More than 4 times per year    Active member of club or organization: Yes    Attends meetings of clubs or organizations: Patient refused    Relationship status: Widowed  . Intimate partner violence:    Fear of current or ex partner: Patient refused    Emotionally abused: Patient refused    Physically abused: Patient refused    Forced sexual activity: Patient refused  Other Topics Concern  . Not on file  Social History Narrative   Lives at [facility] since 09/11/12   Widowed   Never Smoked   Alcohol none   POA, DNR, Living Will        Functional Status Survey:    Family History  Problem Relation Age of Onset  . Colon cancer Son 80  . Crohn's disease Son   . Kidney failure Son   . Pancreatitis Son   . AAA (abdominal aortic aneurysm) Son   . Cancer Maternal Aunt     Health Maintenance  Topic Date Due  . PNA vac Low Risk Adult (2 of 2 - PCV13) 02/27/1989  . INFLUENZA VACCINE  09/27/2017  . TETANUS/TDAP  08/18/2026 (Originally 12/21/1939)  . DEXA SCAN  Completed    Allergies  Allergen Reactions  . Sulfamethoxazole Rash    Outpatient Encounter Medications as of 12/05/2017  Medication Sig  . acetaminophen (TYLENOL) 325 MG tablet Take 650 mg by mouth every 4 (four)  hours as needed for moderate pain.   Marland Kitchen albuterol (PROVENTIL HFA;VENTOLIN HFA) 108 (90 BASE) MCG/ACT inhaler Inhale 2 puffs into the lungs every 6 (six) hours as needed for wheezing or shortness of breath.  Marland Kitchen atorvastatin (LIPITOR) 10 MG tablet Take 10 mg by mouth at bedtime.   . CHLORHEXIDINE GLUCONATE, BULK, SOLN Take 5 mLs by mouth daily. 5 ml swish and spit for 30 seconds every day indefinitely   . cholecalciferol (VITAMIN D) 1000 units tablet Take 1,000 Units by mouth  daily.  . fluticasone furoate-vilanterol (BREO ELLIPTA) 100-25 MCG/INH AEPB Inhale 1 puff into the lungs daily. One puff inhale once a day, rinse mouth after each use.   . Humidifier MISC Please use humidifier to prevent excessive dryness.  Marland Kitchen HYDROcodone-acetaminophen (NORCO) 10-325 MG tablet Take 1 tablet by mouth daily as needed. Patient may take 1  tablet twice daily.  . hydroxyurea (HYDREA) 500 MG capsule Take 500 mg by mouth See admin instructions. Takes 500 mg on Sunday, Wednesday and Friday only  . memantine (NAMENDA) 10 MG tablet Take 10 mg by mouth 2 (two) times daily.   . metoprolol tartrate (LOPRESSOR) 25 MG tablet Take 25 mg by mouth 2 (two) times daily.  . montelukast (SINGULAIR) 10 MG tablet Take 10 mg by mouth at bedtime.  . Multiple Vitamins-Minerals (PRESERVISION AREDS 2) CAPS Take 1 capsule by mouth 2 (two) times daily.  Marland Kitchen NIFEdipine (PROCARDIA) 10 MG capsule Take 10 mg by mouth 3 (three) times daily. Squeeze contents of one capsule uner tongue 15-30 minutes before meals 3 times a day   . nitroGLYCERIN (NITROSTAT) 0.4 MG SL tablet Place 0.4 mg under the tongue every 5 (five) minutes as needed for chest pain.  Marland Kitchen omeprazole (PRILOSEC) 20 MG capsule Take 20 mg by mouth at bedtime.   . Rivaroxaban (XARELTO) 15 MG TABS tablet Take 15 mg by mouth daily. 5pm  . sertraline (ZOLOFT) 25 MG tablet Take 25 mg by mouth daily.  . sodium chloride (OCEAN) 0.65 % nasal spray Place 2 sprays into the nose 2 (two) times daily.  .  sodium fluoride (DENTA 5000 PLUS) 1.1 % CREA dental cream Place 1 application onto teeth at bedtime.   No facility-administered encounter medications on file as of 12/05/2017.     Review of Systems  Constitutional: Negative.   HENT: Negative.   Eyes: Negative.   Respiratory: Negative.   Cardiovascular: Negative.   Gastrointestinal: Negative.   Genitourinary: Negative.   Neurological: Negative.   Hematological: Negative.   Psychiatric/Behavioral: Positive for decreased concentration.    Vitals:   12/05/17 1315  BP: 128/62  Pulse: 72  Resp: 18  Temp: 98.7 F (37.1 C)  SpO2: 97%  Weight: 98 lb 1.6 oz (44.5 kg)  Height: 5\' 1"  (1.549 m)   Body mass index is 18.54 kg/m. Physical Exam  Constitutional: She appears well-developed and well-nourished.  HENT:  Head: Normocephalic.  Mouth/Throat: Oropharynx is clear and moist.  Eyes: Pupils are equal, round, and reactive to light.  Neck: Normal range of motion. Neck supple.  Cardiovascular: Normal rate, regular rhythm and normal heart sounds.  Pulmonary/Chest: Effort normal and breath sounds normal.  Abdominal: Soft. Bowel sounds are normal.  Musculoskeletal:  Good strength in all extremities but ambulates with wheelchair  Neurological: She is alert.  Oriented to person and place but questionable to time  Skin: Skin is warm and dry.  Psychiatric: She has a normal mood and affect. Thought content normal.  She answers questions and follows simple commands without difficulty.  Nursing note and vitals reviewed.   Labs reviewed: Basic Metabolic Panel: Recent Labs    01/30/17 09/18/17  NA 139 142  K 4.3 3.8  CL  --  104  CO2  --  30  BUN 19 21  CREATININE 0.8 0.8  CALCIUM  --  9.4  MG  --  1.9   Liver Function Tests: Recent Labs    01/30/17 09/18/17  AST 17 19  ALT 11 13  ALKPHOS 68  71  PROT  --  6.8  ALBUMIN  --  4.2   No results for input(s): LIPASE, AMYLASE in the last 8760 hours. No results for input(s):  AMMONIA in the last 8760 hours. CBC: Recent Labs    07/03/17 08/28/17 10/29/17  WBC 6.9 6.7 6.3  NEUTROABS 4,264  --   --   HGB 12.3 12.7 12.3  HCT 36 38 36  PLT 269 318 264   Cardiac Enzymes: No results for input(s): CKTOTAL, CKMB, CKMBINDEX, TROPONINI in the last 8760 hours. BNP: Invalid input(s): POCBNP Lab Results  Component Value Date   HGBA1C 5.5 10/16/2014   Lab Results  Component Value Date   TSH 2.00 09/21/2016   No results found for: VITAMINB12 No results found for: FOLATE Lab Results  Component Value Date   IRON 53 06/16/2009   TIBC 388 06/16/2009   FERRITIN 12 06/16/2009    Imaging and Procedures obtained prior to SNF admission: No results found.  Assessment/Plan 1. Atherosclerosis of native coronary artery of native heart with angina pectoris [facility]) She denies chest pain.  She is on both beta-blocker and calcium channel blocker which could prevent some ischemic pain  2. Essential hypertension, benign Review of nurse's notes shows blood pressures have been within acceptable range recently.  Today is 120/62 again same medicines metoprolol and had a pain have positive effect on blood pressure as well.  3. Deep vein thrombosis (DVT) of left lower extremity, unspecified chronicity, unspecified vein (HCC) I do not know for history here but she takes Xarelto and I do not see other reasons for her to be on that except for suspected recurrent DVT.  4. COPD with asthma ([place]) Breathing has not been an issue recently she uses an inhaler with steroid and long-acting bronchodilator as well as Singulair for breathing and asthma.  This seems to have her symptoms well controlled  5. Esophageal dysmotility She denies issues with esophageal dysmotility.  Calcium channel blocker is used in this regard.  6. Chronic depression Patient had been on Zoloft but saw a difference when it was stopped.  It has since been reinstituted with positive effect.   Family/ staff  Communication: Patient seemed to understand reason and findings on exam today  Labs/tests ordered:  Lillette Boxer. Sabra Heck, [place] [address] [place], [place] Office [phone]

## 2017-12-05 NOTE — Telephone Encounter (Signed)
11/20 3:30 pm appt with Dr Ardis Hughs Left message on machine to call back

## 2017-12-05 NOTE — Telephone Encounter (Signed)
The patient has been notified of this information and all questions answered.

## 2017-12-07 ENCOUNTER — Other Ambulatory Visit: Payer: Self-pay | Admitting: Oncology

## 2017-12-07 DIAGNOSIS — D473 Essential (hemorrhagic) thrombocythemia: Secondary | ICD-10-CM

## 2017-12-11 ENCOUNTER — Other Ambulatory Visit: Payer: Self-pay | Admitting: *Deleted

## 2017-12-11 LAB — COMPLETE METABOLIC PANEL WITH GFR
ALBUMIN: 3.2
CHLORIDE: 111
CO2: 29
Calcium: 8.5
EGFR (Non-African Amer.): 75
Globulin: 1.9
TOTAL PROTEIN: 5.1 g/dL

## 2018-01-01 DIAGNOSIS — I10 Essential (primary) hypertension: Secondary | ICD-10-CM | POA: Diagnosis not present

## 2018-01-01 LAB — CBC AND DIFFERENTIAL
HEMATOCRIT: 34 — AB (ref 36–46)
HEMOGLOBIN: 11.6 — AB (ref 12.0–16.0)
PLATELETS: 292 (ref 150–399)
WBC: 5.6

## 2018-01-02 ENCOUNTER — Other Ambulatory Visit: Payer: Self-pay | Admitting: *Deleted

## 2018-01-03 ENCOUNTER — Telehealth: Payer: Self-pay | Admitting: *Deleted

## 2018-01-03 NOTE — Telephone Encounter (Signed)
[place], talked to [employer] - informed "blood counts stable. Stay on current dose of Hydrea." per Dr Beryle Beams. Repeated information. Results to be scanned into pt's chart.

## 2018-01-03 NOTE — Telephone Encounter (Signed)
-----   Message from Annia Belt, MD sent at 01/03/2018  1:47 PM EST ----- Please call pt: blood counts stable. Stay on current dose of Hydrea. ----- Message ----- From: Ebbie Latus, RN Sent: 01/02/2018   1:19 PM EST To: Annia Belt, MD  Dr Darnell Level I will put pt's lab results in your box. Glenda

## 2018-01-04 ENCOUNTER — Encounter: Payer: Self-pay | Admitting: Family Medicine

## 2018-01-04 ENCOUNTER — Non-Acute Institutional Stay (SKILLED_NURSING_FACILITY): Payer: Medicare Other | Admitting: Family Medicine

## 2018-01-04 DIAGNOSIS — K220 Achalasia of cardia: Secondary | ICD-10-CM | POA: Diagnosis not present

## 2018-01-04 DIAGNOSIS — E785 Hyperlipidemia, unspecified: Secondary | ICD-10-CM

## 2018-01-04 DIAGNOSIS — J449 Chronic obstructive pulmonary disease, unspecified: Secondary | ICD-10-CM

## 2018-01-04 DIAGNOSIS — I10 Essential (primary) hypertension: Secondary | ICD-10-CM

## 2018-01-04 NOTE — Progress Notes (Signed)
Provider:  Alain Honey, MD Location:  [facility] Room Number: 28 Place of Service:  SNF (31)  PCP: [name], [name], NP Patient Care Team: [name], [name], NP as PCP - General (Internal Medicine) Annia Belt, MD as Consulting Physician (Hematology and Oncology) Suella Broad, MD as Consulting Physician (Physical Medicine and Rehabilitation) Noralee Space, MD (Pulmonary Disease) [place] [name], [name], NP as Nurse Practitioner (Nurse Practitioner) Inda Castle, MD (Inactive) as Consulting Physician (Gastroenterology)  Extended Emergency Contact Information Primary Emergency Contact: Purnell,Gene Address: [address]          [place], [place] [address] [place] Phone: [phone] Work Phone: [phone] Mobile Phone: [phone] Relation: Son Secondary Emergency Contact: [name], [place] Phone: [phone] Relation: Son  Code Status:  DNR Goals of Care: Advanced Directive information Advanced Directives 01/04/2018  Does Patient Have a Medical Advance Directive? Yes  Type of Paramedic of [place];Living will;Out of facility DNR (pink MOST or yellow form)  Does patient want to make changes to medical advance directive? No - Patient declined  Copy of [place] in Chart? Yes - validated most recent copy scanned in chart (See row information)  Pre-existing out of facility DNR order (yellow form or pink MOST form) Yellow form placed in chart (order not valid for inpatient use)      Chief Complaint  Patient presents with  . Medical Management of Chronic Issues    F/u- HTN, COPD, depression, anxiety, alzheimers,     HPI: Patient is a 84 y.o. female seen today for medical management of chronic problems including: Hypertension, COPD, dementia, and anxiety.  I found her in the activity area listening to some  musical entertainment.  She is alert and responds appropriately to simple questions.  She denies any complaints aches or pains.  Appetite, bowel function, and sleep all appear to be functioning properly.  Past Medical History:  Diagnosis Date  . Abnormal weight gain 05/09/2012  . Achalasia of esophagus 09/18/2013   09/18/13 Nifedipine 10mg  tid ac meals. 11/17/14 [facility] f/u as neeed   . Anxiety and depression 03/12/2012   Increased Sertraline to 50mg  daily 07/16/13 08/26/13 Mirtazapine 7.5mg  qhs  10/12/14 Pharm decreased Sertraline to 25mg .  05/04/15 TSH 2.89 09/15/15 pharm taper off Sertraline.  10/08/15 continue Sertraline 25mg  daily, POA desires. 12/02/15 wbc 6.1, Hgb 12.0, plt 271    . Asthmatic bronchitis   . BACK PAIN, LUMBAR 03/12/2007   Hx of lower back pain, had X-ray of her lumbar spine 09/08/12--spondylosis-better controlled with Hydrocodone/ApAp  10/325mg  ac and hs  01/01/13 lumbar spine inj. Able to ambulate with walker on unit until her fall 06/22/13. 06/25/13 unremarkable. X-ray lumbar spine. 11/10/13 dc Oxycodone prn-not used in 60 days.     . COLONIC POLYPS 03/12/2007   Initially noted April 2002 colonoscopy. Followup colonoscopy 05/31/2006 did not disclose any further polyps.    Marland Kitchen COPD (chronic obstructive pulmonary disease) ([place])   . Coronary artery disease   . Deep vein thrombosis of left lower extremity ([place]) 07/19/2012  . Dementia ([place]) 09/16/2012   05/04/15 TSH 2.89   . Depression   . Diverticulosis 05/10/2012  . DJD (degenerative joint disease)   . Dry skin dermatitis 11/18/2012  . Edema of leg 07/01/2010  . Epistaxis 01/24/2016   01/21/16: x2, held Xarelto x 1  day, Saline Nasal spray prn, wbc 9.5, Hgb 12.4, Plt 331, Na 138, K 4.5, Bun 25, creat 0.89 01/22/16 FGH82(99-37)  . Esophageal dysmotility 07/26/2013  . Esophageal dysphagia 05/27/2010   New dx of achalasia 6/ 2015 09/18/13 Nifedipine 10mg  tid ac meals.    . Esophageal stricture   . Essential hypertension, benign 10/30/2013   12/02/15 wbc  6.1, Hgb 12.0, plt 271   . Essential thrombocythemia ([place]) 06/09/2009  . Fall 06/25/2013  . GERD (gastroesophageal reflux disease)   . Hip fracture ([place]) 09/08/2012  . HYPERCHOLESTEROLEMIA 03/12/2007   08/10/15 cholesterol 119, triglycerides 143, HDL 51, LDL 39   . Lumbar back pain   . MACULAR DEGENERATION 01/26/2008   Qualifier: Diagnosis of  By: Lenna Gilford MD, Deborra Medina   . Osteoarthritis 03/12/2007  . Osteoporosis 03/12/2007   Qualifier: Diagnosis of  By: Julien Girt CMA, Marliss Czar  06/15/14 dc Vit D 1000u po daily per pharm.    . Phlebitis and thrombophlebitis of other deep vessels of lower extremities 07/10/2010  . S/P balloon dilatation of esophageal stricture 07/28/2013   07/26/13. Dr. Henrene Pastor. EGD foreign removal and Balloon dilation of esophagus. Esophageal dysmotility, suspected achalasia, dilation at the GE junction to 52mm. Candida esophagitis.    . SYNCOPE 07/30/2009   Occurred 2011. Hospitalized. Felt due to dehydration.    . TRANSIENT ISCHEMIC ATTACKS, HX OF 03/12/2007   Qualifier: Diagnosis of  By: Julien Girt CMA, Marliss Czar     Past Surgical History:  Procedure Laterality Date  . ABDOMINAL HYSTERECTOMY     and bso  . APPENDECTOMY    . BOTOX INJECTION N/A 09/18/2013   Procedure: BOTOX INJECTION;  Surgeon: Lafayette Dragon, MD;  Location: [facility];  Service: Endoscopy;  Laterality: N/A;  . BOTOX INJECTION N/A 10/16/2014   Procedure: BOTOX INJECTION;  Surgeon: Inda Castle, MD;  Location: [facility];  Service: Endoscopy;  Laterality: N/A;  . BOTOX INJECTION N/A 05/18/2016   Procedure: BOTOX INJECTION;  Surgeon: Milus Banister, MD;  Location: [facility];  Service: Endoscopy;  Laterality: N/A;  . CATARACT EXTRACTION W/ INTRAOCULAR LENS  IMPLANT, BILATERAL    . CHOLECYSTECTOMY    . ESOPHAGEAL MANOMETRY N/A 08/25/2013   Procedure: ESOPHAGEAL MANOMETRY (EM);  Surgeon: Jerene Bears, MD;  Location: [facility];  Service: Gastroenterology;  Laterality: N/A;  . ESOPHAGOGASTRODUODENOSCOPY N/A 07/26/2013    Procedure: ESOPHAGOGASTRODUODENOSCOPY (EGD);  Surgeon: Irene Shipper, MD;  [facility] ENDOSCOPY;  Service: Endoscopy;  Laterality: N/A;  . ESOPHAGOGASTRODUODENOSCOPY N/A 10/16/2014   Procedure: ESOPHAGOGASTRODUODENOSCOPY (EGD);  Surgeon: Inda Castle, MD;  Location: [facility];  Service: Endoscopy;  Laterality: N/A;  with botox  . ESOPHAGOGASTRODUODENOSCOPY (EGD) WITH ESOPHAGEAL DILATION N/A 11/25/2012   Procedure: ESOPHAGOGASTRODUODENOSCOPY (EGD) WITH ESOPHAGEAL DILATION;  Surgeon: Jerene Bears, MD;  [facility];  Service: Gastroenterology;  Laterality: N/A;  . ESOPHAGOGASTRODUODENOSCOPY (EGD) WITH PROPOFOL N/A 09/18/2013   Procedure: ESOPHAGOGASTRODUODENOSCOPY (EGD) WITH PROPOFOL;  Surgeon: Lafayette Dragon, MD;  [facility];  Service: Endoscopy;  Laterality: N/A;  . ESOPHAGOGASTRODUODENOSCOPY (EGD) WITH PROPOFOL N/A 05/18/2016   Procedure: ESOPHAGOGASTRODUODENOSCOPY (EGD) WITH PROPOFOL;  Surgeon: Milus Banister, MD;  Location: [facility];  Service: Endoscopy;  Laterality: N/A;  . HIP ARTHROPLASTY Right 09/09/2012   Procedure: ARTHROPLASTY BIPOLAR HIP;  Surgeon: Mauri Pole, MD;  Location: [facility];  Service: Orthopedics;  Laterality: Right;  . TAH and BSO  08/21/1995    reports that she has never smoked. She has never used smokeless tobacco. She reports that  she does not drink alcohol or use drugs. Social History   Socioeconomic History  . Marital status: Widowed    Spouse name: Gwyndolyn Saxon  . Number of children: 3  . Years of education: Not on file  . Highest education level: Not on file  Occupational History  . 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 Financial resource strain: Not hard at all  . Food insecurity:    Worry: Patient refused    Inability: Patient refused  . Transportation needs:    Medical: Patient refused    Non-medical: Patient refused  Tobacco Use  . Smoking status: Never Smoker  . Smokeless  tobacco: Never Used  Substance and Sexual Activity  . Alcohol use: No    Alcohol/week: 0.0 standard drinks  . Drug use: No  . Sexual activity: Never  Lifestyle  . Physical activity:    Days per week: Patient refused    Minutes per session: Patient refused  . Stress: Not at all  Relationships  . Social connections:    Talks on phone: Never    Gets together: Patient refused    Attends religious service: More than 4 times per year    Active member of club or organization: Yes    Attends meetings of clubs or organizations: Patient refused    Relationship status: Widowed  . Intimate partner violence:    Fear of current or ex partner: Patient refused    Emotionally abused: Patient refused    Physically abused: Patient refused    Forced sexual activity: Patient refused  Other Topics Concern  . Not on file  Social History Narrative   Lives at [facility] since 09/11/12   Widowed   Never Smoked   Alcohol none   POA, DNR, Living Will        Functional Status Survey:    Family History  Problem Relation Age of Onset  . Colon cancer Son 66  . Crohn's disease Son   . Kidney failure Son   . Pancreatitis Son   . AAA (abdominal aortic aneurysm) Son   . Cancer Maternal Aunt     Health Maintenance  Topic Date Due  . PNA vac Low Risk Adult (2 of 2 - PCV13) 02/27/1989  . TETANUS/TDAP  08/18/2026 (Originally 12/21/1939)  . INFLUENZA VACCINE  Completed  . DEXA SCAN  Completed    Allergies  Allergen Reactions  . Sulfamethoxazole Rash    Outpatient Encounter Medications as of 01/04/2018  Medication Sig  . acetaminophen (TYLENOL) 325 MG tablet Take 650 mg by mouth every 8 (eight) hours as needed for moderate pain.   Marland Kitchen albuterol (PROVENTIL HFA;VENTOLIN HFA) 108 (90 BASE) MCG/ACT inhaler Inhale 2 puffs into the lungs every 6 (six) hours as needed for wheezing or shortness of breath.  Marland Kitchen atorvastatin (LIPITOR) 10 MG tablet Take 10 mg by mouth at bedtime.   . CHLORHEXIDINE  GLUCONATE, BULK, SOLN Take 5 mLs by mouth daily. 5 ml swish and spit for 30 seconds every day indefinitely   . cholecalciferol (VITAMIN D) 1000 units tablet Take 1,000 Units by mouth daily.  . fluticasone furoate-vilanterol (BREO ELLIPTA) 100-25 MCG/INH AEPB Inhale 1 puff into the lungs daily. One puff inhale once a day, rinse mouth after each use.   . Humidifier MISC Please use humidifier to prevent excessive dryness.  Marland Kitchen HYDROcodone-acetaminophen (NORCO) 10-325 MG tablet Take 1 tablet by mouth daily as needed. Patient may take  1  tablet twice daily.  . hydroxyurea (HYDREA) 500 MG capsule Take 500 mg by mouth See admin instructions. Takes 500 mg on Sunday, Wednesday and Friday only  . memantine (NAMENDA) 10 MG tablet Take 10 mg by mouth 2 (two) times daily.   . metoprolol tartrate (LOPRESSOR) 25 MG tablet Take 25 mg by mouth 2 (two) times daily.  . montelukast (SINGULAIR) 10 MG tablet Take 10 mg by mouth at bedtime.  . Multiple Vitamins-Minerals (PRESERVISION AREDS 2) CAPS Take 1 capsule by mouth 2 (two) times daily.  Marland Kitchen NIFEdipine (PROCARDIA) 10 MG capsule Take 10 mg by mouth 3 (three) times daily. Squeeze contents of one capsule uner tongue 15-30 minutes before meals 3 times a day   . nitroGLYCERIN (NITROSTAT) 0.4 MG SL tablet Place 0.4 mg under the tongue every 5 (five) minutes as needed for chest pain.  Marland Kitchen omeprazole (PRILOSEC) 20 MG capsule Take 20 mg by mouth at bedtime.   . Rivaroxaban (XARELTO) 15 MG TABS tablet Take 15 mg by mouth daily. 5pm  . sertraline (ZOLOFT) 25 MG tablet Take 25 mg by mouth daily.  . sodium chloride (OCEAN) 0.65 % nasal spray Place 2 sprays into the nose 2 (two) times daily.  . sodium fluoride (DENTA 5000 PLUS) 1.1 % CREA dental cream Place 1 application onto teeth at bedtime.   No facility-administered encounter medications on file as of 01/04/2018.     Review of Systems  Constitutional: Negative.   HENT: Negative.   Respiratory: Negative.   Cardiovascular:  Negative.   Genitourinary: Negative.   Musculoskeletal: Positive for gait problem.  Psychiatric/Behavioral: Positive for confusion.    Vitals:   01/04/18 1105  BP: 136/70  Pulse: 74  Resp: (!) 22  Temp: (!) 97.5 F (36.4 C)  SpO2: 97%  Weight: 97 lb 9.6 oz (44.3 kg)  Height: 5\' 1"  (1.549 m)   Body mass index is 18.44 kg/m. Physical Exam  Constitutional: She appears well-developed and well-nourished.  HENT:  Head: Normocephalic.  Mouth/Throat: Oropharynx is clear and moist.  Eyes: Pupils are equal, round, and reactive to light.  Neck: Normal range of motion.  Cardiovascular: Normal rate, regular rhythm and normal heart sounds.  Pulmonary/Chest: Effort normal.  Abdominal: Soft. Bowel sounds are normal.  Musculoskeletal: Normal range of motion.  Ambulates by wheelchair  Neurological: She is alert.  Oriented to person and place  Nursing note and vitals reviewed.   Labs reviewed: Basic Metabolic Panel: Recent Labs    01/30/17 09/18/17 11/21/17  NA 139 142 145  K 4.3 3.8 4.0  CL  --  104 111  CO2  --  30 29  BUN 19 21 17   CREATININE 0.8 0.8 0.7  CALCIUM  --  9.4 8.5  MG  --  1.9  --    Liver Function Tests: Recent Labs    01/30/17 09/18/17 11/21/17  AST 17 19 12*  ALT 11 13 10   ALKPHOS 68 71 66  PROT  --  6.8 5.1  ALBUMIN  --  4.2 3.2   No results for input(s): LIPASE, AMYLASE in the last 8760 hours. No results for input(s): AMMONIA in the last 8760 hours. CBC: Recent Labs    07/03/17  10/29/17 11/21/17 01/01/18  WBC 6.9   < > 6.3 5.7 5.6  NEUTROABS 4,264  --   --   --   --   HGB 12.3   < > 12.3 11.2* 11.6*  HCT 36   < > 36 33*  34*  PLT 269   < > 264 333 292   < > = values in this interval not displayed.   Cardiac Enzymes: No results for input(s): CKTOTAL, CKMB, CKMBINDEX, TROPONINI in the last 8760 hours. BNP: Invalid input(s): POCBNP Lab Results  Component Value Date   HGBA1C 5.5 10/16/2014   Lab Results  Component Value Date   TSH 2.00  09/21/2016   No results found for: VITAMINB12 No results found for: FOLATE Lab Results  Component Value Date   IRON 53 06/16/2009   TIBC 388 06/16/2009   FERRITIN 12 06/16/2009    Imaging and Procedures obtained prior to SNF admission: No results found.  Assessment/Plan 1. Essential hypertension, benign Blood pressures have been well controlled on metoprolol 25 mg twice daily and nifedipine 10 mg 3 times daily  2. COPD with asthma ([place]) Patient is on steroid and long-acting bronchodilator with rescue albuterol.  Also takes Singulair.  There have been no issues with her pulmonary function recently  3. Achalasia of esophagus No recent history of aspiration.  She does take nifedipine which likely helps that her swallowing  4. Hyperlipidemia LDL goal <70 Lipids were last assessed 4 months ago.  LDL cholesterol is 70 on atorvastatin, 10 mg   Family/ staff Communication: Findings communicated to nursing staff  Labs/tests ordered: none  Lillette Boxer. Sabra Heck, [place] [address] [place], [place] Office [phone]

## 2018-01-08 ENCOUNTER — Non-Acute Institutional Stay (SKILLED_NURSING_FACILITY): Payer: Medicare Other | Admitting: Nurse Practitioner

## 2018-01-08 ENCOUNTER — Encounter: Payer: Self-pay | Admitting: Nurse Practitioner

## 2018-01-08 DIAGNOSIS — K219 Gastro-esophageal reflux disease without esophagitis: Secondary | ICD-10-CM | POA: Diagnosis not present

## 2018-01-08 DIAGNOSIS — J449 Chronic obstructive pulmonary disease, unspecified: Secondary | ICD-10-CM

## 2018-01-08 DIAGNOSIS — G301 Alzheimer's disease with late onset: Secondary | ICD-10-CM | POA: Diagnosis not present

## 2018-01-08 DIAGNOSIS — F0280 Dementia in other diseases classified elsewhere without behavioral disturbance: Secondary | ICD-10-CM

## 2018-01-08 DIAGNOSIS — K220 Achalasia of cardia: Secondary | ICD-10-CM

## 2018-01-08 NOTE — Assessment & Plan Note (Addendum)
F/u Dr. Deatra Ina GI prn, the patient declined f/u, will inform the patient's son. Continue observe the patient, continue Nifedipine 10mg  tid, Omeprazole 20mg  qd.

## 2018-01-08 NOTE — Assessment & Plan Note (Signed)
Stable, continue Breo Elipta daily, Singulair 10mg  qd.

## 2018-01-08 NOTE — Assessment & Plan Note (Signed)
Continue SNF [facility] for safety and care assistance, continue Memantine 10mg bid for memory 

## 2018-01-08 NOTE — Progress Notes (Signed)
Location:  [facility] Room Number: 35 Place of Service:  SNF (31) Provider:  Marlana Latus  NP  Brick Ketcher X, NP  Patient Care Team: Zhavia Cunanan X, NP as PCP - General (Internal Medicine) Annia Belt, MD as Consulting Physician (Hematology and Oncology) Suella Broad, MD as Consulting Physician (Physical Medicine and Rehabilitation) Noralee Space, MD (Pulmonary Disease) [place] X, NP as Nurse Practitioner (Nurse Practitioner) Inda Castle, MD (Inactive) as Consulting Physician (Gastroenterology)  Extended Emergency Contact Information Primary Emergency Contact: Reim,Gene Address: [address]          [place], [place] [address] [place] Phone: [phone] Work Phone: [phone] Mobile Phone: [phone] Relation: Son Secondary Emergency Contact: [name], [place] Phone: [phone] Relation: Son  Code Status:  DNR Goals of care: Advanced Directive information Advanced Directives 01/04/2018  Does Patient Have a Medical Advance Directive? Yes  Type of Paramedic of [place];Living will;Out of facility DNR (pink MOST or yellow form)  Does patient want to make changes to medical advance directive? No - Patient declined  Copy of [place] in Chart? Yes - validated most recent copy scanned in chart (See row information)  Pre-existing out of facility DNR order (yellow form or pink MOST form) Yellow form placed in chart (order not valid for inpatient use)     Chief Complaint  Patient presents with  . Acute Visit    vomiting    HPI:  Pt is a 84 y.o. female seen today for an acute visit for evaluation for reported the patient's "spitting up" 01/07/18. She was observed to spitting up thick substances/frothy phlegm/ and having poor appetite. The patient denied cough, SOB, chest pain/pressure, or  palpitation. She is in her usual state of health/appetite today, no further spitting up. Hx of achalasia of esophagus, s/p dilatation, on Nifedipine 10mg  tid, Omeprazole 20mg  qd. HPI was provided with assistance of staff. She resides in [facility], on Memantine 10mg  bid. Hx of COPD/asthma, on Breo Ellipta daily, Singulair 10mg  qd for maintenance.    Past Medical History:  Diagnosis Date  . Abnormal weight gain 05/09/2012  . Achalasia of esophagus 09/18/2013   09/18/13 Nifedipine 10mg  tid ac meals. 11/17/14 [facility] f/u as neeed   . Anxiety and depression 03/12/2012   Increased Sertraline to 50mg  daily 07/16/13 08/26/13 Mirtazapine 7.5mg  qhs  10/12/14 Pharm decreased Sertraline to 25mg .  05/04/15 TSH 2.89 09/15/15 pharm taper off Sertraline.  10/08/15 continue Sertraline 25mg  daily, POA desires. 12/02/15 wbc 6.1, Hgb 12.0, plt 271    . Asthmatic bronchitis   . BACK PAIN, LUMBAR 03/12/2007   Hx of lower back pain, had X-ray of her lumbar spine 09/08/12--spondylosis-better controlled with Hydrocodone/ApAp  10/325mg  ac and hs  01/01/13 lumbar spine inj. Able to ambulate with walker on unit until her fall 06/22/13. 06/25/13 unremarkable. X-ray lumbar spine. 11/10/13 dc Oxycodone prn-not used in 60 days.     . COLONIC POLYPS 03/12/2007   Initially noted April 2002 colonoscopy. Followup colonoscopy 05/31/2006 did not disclose any further polyps.    Marland Kitchen COPD (chronic obstructive pulmonary disease) ([place])   . Coronary artery disease   . Deep vein thrombosis of left lower extremity ([place]) 07/19/2012  . Dementia ([place]) 09/16/2012   05/04/15 TSH 2.89   . Depression   . Diverticulosis 05/10/2012  .  DJD (degenerative joint disease)   . Dry skin dermatitis 11/18/2012  . Edema of leg 07/01/2010  . Epistaxis 01/24/2016   01/21/16: x2, held Xarelto x 1 day, Saline Nasal spray prn, wbc 9.5, Hgb 12.4, Plt 331, Na 138, K 4.5, Bun 25, creat 0.89 01/22/16 [id number]([id number])  . Esophageal dysmotility 07/26/2013  . Esophageal dysphagia 05/27/2010   New dx  of achalasia 6/ 2015 09/18/13 Nifedipine 10mg  tid ac meals.    . Esophageal stricture   . Essential hypertension, benign 10/30/2013   12/02/15 wbc 6.1, Hgb 12.0, plt 271   . Essential thrombocythemia ([place]) 06/09/2009  . Fall 06/25/2013  . GERD (gastroesophageal reflux disease)   . Hip fracture ([place]) 09/08/2012  . HYPERCHOLESTEROLEMIA 03/12/2007   08/10/15 cholesterol 119, triglycerides 143, HDL 51, LDL 39   . Lumbar back pain   . MACULAR DEGENERATION 01/26/2008   Qualifier: Diagnosis of  By: Lenna Gilford MD, Deborra Medina   . Osteoarthritis 03/12/2007  . Osteoporosis 03/12/2007   Qualifier: Diagnosis of  By: Julien Girt CMA, Marliss Czar  06/15/14 dc Vit D 1000u po daily per pharm.    . Phlebitis and thrombophlebitis of other deep vessels of lower extremities 07/10/2010  . S/P balloon dilatation of esophageal stricture 07/28/2013   07/26/13. Dr. Henrene Pastor. EGD foreign removal and Balloon dilation of esophagus. Esophageal dysmotility, suspected achalasia, dilation at the GE junction to 68mm. Candida esophagitis.    . SYNCOPE 07/30/2009   Occurred 2011. Hospitalized. Felt due to dehydration.    . TRANSIENT ISCHEMIC ATTACKS, HX OF 03/12/2007   Qualifier: Diagnosis of  By: Julien Girt CMA, Marliss Czar     Past Surgical History:  Procedure Laterality Date  . ABDOMINAL HYSTERECTOMY     and bso  . APPENDECTOMY    . BOTOX INJECTION N/A 09/18/2013   Procedure: BOTOX INJECTION;  Surgeon: Lafayette Dragon, MD;  Location: [facility] ENDOSCOPY;  Service: Endoscopy;  Laterality: N/A;  . BOTOX INJECTION N/A 10/16/2014   Procedure: BOTOX INJECTION;  Surgeon: Inda Castle, MD;  Location: [facility];  Service: Endoscopy;  Laterality: N/A;  . BOTOX INJECTION N/A 05/18/2016   Procedure: BOTOX INJECTION;  Surgeon: Milus Banister, MD;  Location: [facility] END[facility];  Service: Endoscopy;  Laterality: N/A;  . CATARACT EXTRACTION W/ INTRAOCULAR LENS  IMPLANT, BILATERAL    . CHOLECYSTECTOMY    . ESOPHAGEAL MANOMETRY N/A 08/25/2013   Procedure: ESOPHAGEAL MANOMETRY (EM);   Surgeon: Jerene Bears, MD;  Location: [facility];  Service: Gastroenterology;  Laterality: N/A;  . ESOPHAGOGASTRODUODENOSCOPY N/A 07/26/2013   Procedure: ESOPHAGOGASTRODUODENOSCOPY (EGD);  Surgeon: Irene Shipper, MD;  Location: [facility] ENDOSCOPY;  Service: Endoscopy;  Laterality: N/A;  . ESOPHAGOGASTRODUODENOSCOPY N/A 10/16/2014   Procedure: ESOPHAGOGASTRODUODENOSCOPY (EGD);  Surgeon: Inda Castle, MD;  Location: [facility];  Service: Endoscopy;  Laterality: N/A;  with botox  . ESOPHAGOGASTRODUODENOSCOPY (EGD) WITH ESOPHAGEAL DILATION N/A 11/25/2012   Procedure: ESOPHAGOGASTRODUODENOSCOPY (EGD) WITH ESOPHAGEAL DILATION;  Surgeon: Jerene Bears, MD;  Location: [facility];  Service: Gastroenterology;  Laterality: N/A;  . ESOPHAGOGASTRODUODENOSCOPY (EGD) WITH PROPOFOL N/A 09/18/2013   Procedure: ESOPHAGOGASTRODUODENOSCOPY (EGD) WITH PROPOFOL;  Surgeon: Lafayette Dragon, MD;  Location: [facility];  Service: Endoscopy;  Laterality: N/A;  . ESOPHAGOGASTRODUODENOSCOPY (EGD) WITH PROPOFOL N/A 05/18/2016   Procedure: ESOPHAGOGASTRODUODENOSCOPY (EGD) WITH PROPOFOL;  Surgeon: Milus Banister, MD;  Location: [facility];  Service: Endoscopy;  Laterality: N/A;  . HIP ARTHROPLASTY Right 09/09/2012   Procedure: ARTHROPLASTY BIPOLAR HIP;  Surgeon: Mauri Pole, MD;  Location: [facility];  Service:  Orthopedics;  Laterality: Right;  . TAH and BSO  08/21/1995    Allergies  Allergen Reactions  . Sulfamethoxazole Rash    Outpatient Encounter Medications as of 01/08/2018  Medication Sig  . acetaminophen (TYLENOL) 325 MG tablet Take 650 mg by mouth every 8 (eight) hours as needed for moderate pain.   Marland Kitchen albuterol (PROVENTIL HFA;VENTOLIN HFA) 108 (90 BASE) MCG/ACT inhaler Inhale 2 puffs into the lungs every 6 (six) hours as needed for wheezing or shortness of breath.  Marland Kitchen atorvastatin (LIPITOR) 10 MG tablet Take 10 mg by mouth at bedtime.   . CHLORHEXIDINE GLUCONATE, BULK, SOLN Take 5 mLs by mouth daily. 5 ml swish and spit  for 30 seconds every day indefinitely   . cholecalciferol (VITAMIN D) 1000 units tablet Take 1,000 Units by mouth daily.  . fluticasone furoate-vilanterol (BREO ELLIPTA) 100-25 MCG/INH AEPB Inhale 1 puff into the lungs daily. One puff inhale once a day, rinse mouth after each use.   . Humidifier MISC Please use humidifier to prevent excessive dryness.  Marland Kitchen HYDROcodone-acetaminophen (NORCO) 10-325 MG tablet Take 1 tablet by mouth daily as needed. Patient may take 1  tablet twice daily.  . hydroxyurea (HYDREA) 500 MG capsule Take 500 mg by mouth See admin instructions. Takes 500 mg on Sunday, Wednesday and Friday only. Take 2 capsules to equal 1000 mg on Monday. WEAR GLOVES WHEN HANDLING THIS MEDICATION.  . memantine (NAMENDA) 10 MG tablet Take 10 mg by mouth 2 (two) times daily.   . metoprolol tartrate (LOPRESSOR) 25 MG tablet Take 25 mg by mouth 2 (two) times daily.  . montelukast (SINGULAIR) 10 MG tablet Take 10 mg by mouth at bedtime.  . Multiple Vitamins-Minerals (PRESERVISION AREDS 2) CAPS Take 1 capsule by mouth 2 (two) times daily.  Marland Kitchen NIFEdipine (PROCARDIA) 10 MG capsule Take 10 mg by mouth 3 (three) times daily. Squeeze contents of one capsule uner tongue 15-30 minutes before meals 3 times a day   . nitroGLYCERIN (NITROSTAT) 0.4 MG SL tablet Place 0.4 mg under the tongue every 5 (five) minutes as needed for chest pain.  Marland Kitchen omeprazole (PRILOSEC) 20 MG capsule Take 20 mg by mouth at bedtime.   . Rivaroxaban (XARELTO) 15 MG TABS tablet Take 15 mg by mouth daily. 5pm  . sertraline (ZOLOFT) 25 MG tablet Take 25 mg by mouth daily.  . sodium chloride (OCEAN) 0.65 % nasal spray Place 2 sprays into the nose 2 (two) times daily.  . sodium fluoride (DENTA 5000 PLUS) 1.1 % CREA dental cream Place 1 application onto teeth at bedtime.   No facility-administered encounter medications on file as of 01/08/2018.    ROS was provided with assistance of staff.  Review of Systems  Constitutional: Negative for  activity change, appetite change, chills, diaphoresis, fatigue and fever.  HENT: Positive for hearing loss and trouble swallowing. Negative for congestion, rhinorrhea, sinus pressure, sore throat and voice change.   Respiratory: Negative for cough, shortness of breath and wheezing.   Cardiovascular: Negative for chest pain, palpitations and leg swelling.  Gastrointestinal: Negative for abdominal distention, abdominal pain, blood in stool, constipation, diarrhea, nausea and vomiting.  Genitourinary: Negative for difficulty urinating, dysuria and urgency.  Musculoskeletal: Positive for arthralgias, back pain and gait problem.  Skin: Negative for color change and pallor.  Neurological: Negative for dizziness, speech difficulty, weakness and headaches.       Memory lapses.   Psychiatric/Behavioral: Positive for confusion. Negative for agitation, behavioral problems and sleep disturbance. The patient is  not nervous/anxious.     Immunization History  Administered Date(s) Administered  . Influenza Split 11/16/2010, 12/13/2011  . Influenza Whole 11/27/2009, 11/29/2017  . Influenza-Unspecified 12/31/2013, 11/18/2014, 12/14/2015, 12/11/2016  . PPD Test 09/11/2012  . Pneumococcal Polysaccharide-23 02/28/1988   Pertinent  Health Maintenance Due  Topic Date Due  . PNA vac Low Risk Adult (2 of 2 - PCV13) 02/27/1989  . INFLUENZA VACCINE  Completed  . DEXA SCAN  Completed   Fall Risk  02/08/2017 02/08/2017 10/04/2015 12/28/2014 10/23/2014  Falls in the past year? No Exclusion - non ambulatory No Yes Yes  Number falls in past yr: - - - 1 1  Injury with Fall? - - - No No  Risk for fall due to : - Impaired balance/gait;Impaired vision Impaired mobility;Impaired balance/gait History of fall(s);Impaired balance/gait;Impaired mobility Impaired mobility  Follow up - - - Falls prevention discussed Falls evaluation completed   Functional Status Survey:    Vitals:   01/08/18 1119  BP: 120/64  Pulse: 85    Resp: 20  Temp: 99.1 F (37.3 C)  SpO2: 94%  Weight: 97 lb 9.6 oz (44.3 kg)  Height: 5\' 1"  (1.549 m)   Body mass index is 18.44 kg/m. Physical Exam  Constitutional: She appears well-developed and well-nourished.  HENT:  Head: Normocephalic and atraumatic.  Eyes: Pupils are equal, round, and reactive to light. EOM are normal.  Neck: Normal range of motion. Neck supple. No JVD present. No thyromegaly present.  Cardiovascular: Normal rate and regular rhythm.  No murmur heard. Pulmonary/Chest: Effort normal. She has no wheezes. She has no rales.  Decreased air entry to both lungs.   Abdominal: Soft. She exhibits no distension. There is no tenderness. There is no rebound and no guarding.  Musculoskeletal: Normal range of motion.  Self transfer. W/c for mobility. Kyphoscoliosis.   Neurological: She is alert. No cranial nerve deficit. She exhibits normal muscle tone. Coordination normal.  Oriented to person and place.   Skin: Skin is warm and dry.  Psychiatric: She has a normal mood and affect. Her behavior is normal.    Labs reviewed: Recent Labs    01/30/17 09/18/17 11/21/17  NA 139 142 145  K 4.3 3.8 4.0  CL  --  104 111  CO2  --  30 29  BUN 19 21 17   CREATININE 0.8 0.8 0.7  CALCIUM  --  9.4 8.5  MG  --  1.9  --    Recent Labs    01/30/17 09/18/17 11/21/17  AST 17 19 12*  ALT 11 13 10   ALKPHOS 68 71 66  PROT  --  6.8 5.1  ALBUMIN  --  4.2 3.2   Recent Labs    07/03/17  10/29/17 11/21/17 01/01/18  WBC 6.9   < > 6.3 5.7 5.6  NEUTROABS 4,264  --   --   --   --   HGB 12.3   < > 12.3 11.2* 11.6*  HCT 36   < > 36 33* 34*  PLT 269   < > 264 333 292   < > = values in this interval not displayed.   Lab Results  Component Value Date   TSH 2.00 09/21/2016   Lab Results  Component Value Date   HGBA1C 5.5 10/16/2014   Lab Results  Component Value Date   CHOL 143 09/18/2017   HDL 50 09/18/2017   LDLCALC 70 09/18/2017   LDLDIRECT 152.3 06/04/2009   TRIG 147  09/18/2017   CHOLHDL  3 09/13/2011    Significant Diagnostic Results in last 30 days:  No results found.  Assessment/Plan Achalasia of esophagus F/u Dr. Deatra Ina GI prn, the patient declined f/u, will inform the patient's son. Continue observe the patient, continue Nifedipine 10mg  tid, Omeprazole 20mg  qd.   COPD with asthma ([place]) Stable, continue Breo Elipta daily, Singulair 10mg  qd.   GERD Continue Omeprazole for symptomatic management.   Alzheimer's dementia without behavioral disturbance Continue SN[facility] for safety and care assistance, continue Memantine 10mg  bid for memory.      Family/ staff Communication: plan of care reviewed with the patient and charge nurse.   Labs/tests ordered:  None  Time spend 25 minutes.

## 2018-01-08 NOTE — Assessment & Plan Note (Signed)
Continue Omeprazole for symptomatic management.

## 2018-01-16 ENCOUNTER — Encounter: Payer: Self-pay | Admitting: Gastroenterology

## 2018-01-16 ENCOUNTER — Ambulatory Visit (INDEPENDENT_AMBULATORY_CARE_PROVIDER_SITE_OTHER): Payer: Medicare Other | Admitting: Gastroenterology

## 2018-01-16 VITALS — BP 124/60 | HR 66 | Ht 61.0 in | Wt 98.0 lb

## 2018-01-16 DIAGNOSIS — K220 Achalasia of cardia: Secondary | ICD-10-CM | POA: Diagnosis not present

## 2018-01-16 NOTE — Progress Notes (Signed)
Review of pertinent gastrointestinal problems: 1. manometrically confirmed Achalasia that has previously responded to GE junction Botox  injections several times: . Dr. Olevia Perches 2015. Dr. Deatra Ina 2016. and most recently 04/2016 Dr. Ardis Hughs.  His res manometry 2015 + for achalasia.     HPI: This is a very pleasant, 84 year old woman with dementia who is here with her son today.  I last saw her about a year and a half ago.  Chief complaint is achalasia  Tempie is not really able to give any reliable history although she is quite pleasant she has significant Alzheimer's dementia.  Her son acts as her history giver.  He tells me that since this appointment was scheduled about a month ago, she has had 2 episodes of dysphasia but he is aware of.  The nursing home staff informed him of one and he witnessed another one.  He tells me his mom is unable to give any supporting history.   Blood work November 2019 hemoglobin 11.6, platelets 292,   Blood work September 2956 complete metabolic profile was normal    ROS: complete GI ROS as described in HPI, all other review negative.  Constitutional:  No unintentional weight loss   Past Medical History:  Diagnosis Date  . Abnormal weight gain 05/09/2012  . Achalasia of esophagus 09/18/2013   09/18/13 Nifedipine 10mg  tid ac meals. 11/17/14 [facility] f/u as neeed   . Anxiety and depression 03/12/2012   Increased Sertraline to 50mg  daily 07/16/13 08/26/13 Mirtazapine 7.5mg  qhs  10/12/14 Pharm decreased Sertraline to 25mg .  05/04/15 TSH 2.89 09/15/15 pharm taper off Sertraline.  10/08/15 continue Sertraline 25mg  daily, POA desires. 12/02/15 wbc 6.1, Hgb 12.0, plt 271    . Asthmatic bronchitis   . BACK PAIN, LUMBAR 03/12/2007   Hx of lower back pain, had X-ray of her lumbar spine 09/08/12--spondylosis-better controlled with Hydrocodone/ApAp  10/325mg  ac and hs  01/01/13 lumbar spine inj. Able to ambulate with walker on unit until her fall 06/22/13. 06/25/13 unremarkable. X-ray  lumbar spine. 11/10/13 dc Oxycodone prn-not used in 60 days.     . COLONIC POLYPS 03/12/2007   Initially noted April 2002 colonoscopy. Followup colonoscopy 05/31/2006 did not disclose any further polyps.    Marland Kitchen COPD (chronic obstructive pulmonary disease) ([place])   . Coronary artery disease   . Deep vein thrombosis of left lower extremity ([place]) 07/19/2012  . Dementia ([place]) 09/16/2012   05/04/15 TSH 2.89   . Depression   . Diverticulosis 05/10/2012  . DJD (degenerative joint disease)   . Dry skin dermatitis 11/18/2012  . Edema of leg 07/01/2010  . Epistaxis 01/24/2016   01/21/16: x2, held Xarelto x 1 day, Saline Nasal spray prn, wbc 9.5, Hgb 12.4, Plt 331, Na 138, K 4.5, Bun 25, creat 0.89 01/22/16 [id number]([id number])  . Esophageal dysmotility 07/26/2013  . Esophageal dysphagia 05/27/2010   New dx of achalasia 6/ 2015 09/18/13 Nifedipine 10mg  tid ac meals.    . Esophageal stricture   . Essential hypertension, benign 10/30/2013   12/02/15 wbc 6.1, Hgb 12.0, plt 271   . Essential thrombocythemia ([place]) 06/09/2009  . Fall 06/25/2013  . GERD (gastroesophageal reflux disease)   . Hip fracture ([place]) 09/08/2012  . HYPERCHOLESTEROLEMIA 03/12/2007   08/10/15 cholesterol 119, triglycerides 143, HDL 51, LDL 39   . Lumbar back pain   . MACULAR DEGENERATION 01/26/2008   Qualifier: Diagnosis of  By: Lenna Gilford MD, Deborra Medina   . Osteoarthritis 03/12/2007  . Osteoporosis 03/12/2007   Qualifier: Diagnosis of  By: Julien Girt CMA, Marliss Czar  06/15/14 dc Vit D 1000u po daily per pharm.    . Phlebitis and thrombophlebitis of other deep vessels of lower extremities 07/10/2010  . S/P balloon dilatation of esophageal stricture 07/28/2013   07/26/13. Dr. Henrene Pastor. EGD foreign removal and Balloon dilation of esophagus. Esophageal dysmotility, suspected achalasia, dilation at the GE junction to 55mm. Candida esophagitis.    . SYNCOPE 07/30/2009   Occurred 2011. Hospitalized. Felt due to dehydration.    . TRANSIENT ISCHEMIC ATTACKS, HX OF 03/12/2007   Qualifier:  Diagnosis of  By: Julien Girt CMA, Marliss Czar      Past Surgical History:  Procedure Laterality Date  . ABDOMINAL HYSTERECTOMY     and bso  . APPENDECTOMY    . BOTOX INJECTION N/A 09/18/2013   Procedure: BOTOX INJECTION;  Surgeon: Lafayette Dragon, MD;  [facility]: [facility];  Service: Endoscopy;  Laterality: N/A;  . BOTOX INJECTION N/A 10/16/2014   Procedure: BOTOX INJECTION;  Surgeon: Inda Castle, MD;  [facility];  Service: Endoscopy;  Laterality: N/A;  . BOTOX INJECTION N/A 05/18/2016   Procedure: BOTOX INJECTION;  Surgeon: Milus Banister, MD;  [facility];  Service: Endoscopy;  Laterality: N/A;  . CATARACT EXTRACTION W/ INTRAOCULAR LENS  IMPLANT, BILATERAL    . CHOLECYSTECTOMY    . ESOPHAGEAL MANOMETRY N/A 08/25/2013   Procedure: ESOPHAGEAL MANOMETRY (EM);  Surgeon: Jerene Bears, MD;  [facility];  Service: Gastroenterology;  Laterality: N/A;  . ESOPHAGOGASTRODUODENOSCOPY N/A 07/26/2013   Procedure: ESOPHAGOGASTRODUODENOSCOPY (EGD);  Surgeon: Irene Shipper, MD;  Location: [facility] ENDOSCOPY;  Service: Endoscopy;  Laterality: N/A;  . ESOPHAGOGASTRODUODENOSCOPY N/A 10/16/2014   Procedure: ESOPHAGOGASTRODUODENOSCOPY (EGD);  Surgeon: Inda Castle, MD;  Location: [facility];  Service: Endoscopy;  Laterality: N/A;  with botox  . ESOPHAGOGASTRODUODENOSCOPY (EGD) WITH ESOPHAGEAL DILATION N/A 11/25/2012   Procedure: ESOPHAGOGASTRODUODENOSCOPY (EGD) WITH ESOPHAGEAL DILATION;  Surgeon: Jerene Bears, MD;  [facility];  Service: Gastroenterology;  Laterality: N/A;  . ESOPHAGOGASTRODUODENOSCOPY (EGD) WITH PROPOFOL N/A 09/18/2013   Procedure: ESOPHAGOGASTRODUODENOSCOPY (EGD) WITH PROPOFOL;  Surgeon: Lafayette Dragon, MD;  [facility];  Service: Endoscopy;  Laterality: N/A;  . ESOPHAGOGASTRODUODENOSCOPY (EGD) WITH PROPOFOL N/A 05/18/2016   Procedure: ESOPHAGOGASTRODUODENOSCOPY (EGD) WITH PROPOFOL;  Surgeon: Milus Banister, MD;  [facility];  Service:  Endoscopy;  Laterality: N/A;  . HIP ARTHROPLASTY Right 09/09/2012   Procedure: ARTHROPLASTY BIPOLAR HIP;  Surgeon: Mauri Pole, MD;  [facility];  Service: Orthopedics;  Laterality: Right;  . TAH and BSO  08/21/1995    Current Outpatient Medications  Medication Sig Dispense Refill  . acetaminophen (TYLENOL) 325 MG tablet Take 650 mg by mouth every 8 (eight) hours as needed for moderate pain.     Marland Kitchen albuterol (PROVENTIL HFA;VENTOLIN HFA) 108 (90 BASE) MCG/ACT inhaler Inhale 2 puffs into the lungs every 6 (six) hours as needed for wheezing or shortness of breath.    Marland Kitchen atorvastatin (LIPITOR) 10 MG tablet Take 10 mg by mouth at bedtime.     . CHLORHEXIDINE GLUCONATE, BULK, SOLN Take 5 mLs by mouth daily. 5 ml swish and spit for 30 seconds every day indefinitely     . cholecalciferol (VITAMIN D) 1000 units tablet Take 1,000 Units by mouth daily.    . fluticasone furoate-vilanterol (BREO ELLIPTA) 100-25 MCG/INH AEPB Inhale 1 puff into the lungs daily. One puff inhale once a day, rinse mouth after each use.     . Humidifier MISC Please use  humidifier to prevent excessive dryness. 1 each 0  . HYDROcodone-acetaminophen (NORCO) 10-325 MG tablet Take 1 tablet by mouth daily as needed. Patient may take 1  tablet twice daily.    . hydroxyurea (HYDREA) 500 MG capsule Take 500 mg by mouth See admin instructions. Takes 500 mg on Sunday, Wednesday and Friday only. Take 2 capsules to equal 1000 mg on Monday. WEAR GLOVES WHEN HANDLING THIS MEDICATION.    . memantine (NAMENDA) 10 MG tablet Take 10 mg by mouth 2 (two) times daily.     . metoprolol tartrate (LOPRESSOR) 25 MG tablet Take 25 mg by mouth 2 (two) times daily.    . montelukast (SINGULAIR) 10 MG tablet Take 10 mg by mouth at bedtime.    . Multiple Vitamins-Minerals (PRESERVISION AREDS 2) CAPS Take 1 capsule by mouth 2 (two) times daily.    Marland Kitchen NIFEdipine (PROCARDIA) 10 MG capsule Take 10 mg by mouth 3 (three) times daily. Squeeze contents of one capsule  uner tongue 15-30 minutes before meals 3 times a day     . nitroGLYCERIN (NITROSTAT) 0.4 MG SL tablet Place 0.4 mg under the tongue every 5 (five) minutes as needed for chest pain.    Marland Kitchen omeprazole (PRILOSEC) 20 MG capsule Take 20 mg by mouth at bedtime.     . Rivaroxaban (XARELTO) 15 MG TABS tablet Take 15 mg by mouth daily. 5pm    . sertraline (ZOLOFT) 25 MG tablet Take 25 mg by mouth daily.    . sodium chloride (OCEAN) 0.65 % nasal spray Place 2 sprays into the nose 2 (two) times daily.    . sodium fluoride (DENTA 5000 PLUS) 1.1 % CREA dental cream Place 1 application onto teeth at bedtime.     No current facility-administered medications for this visit.     Allergies as of 01/16/2018 - Review Complete 01/16/2018  Allergen Reaction Noted  . Sulfamethoxazole Rash 07/13/2015    Family History  Problem Relation Age of Onset  . Colon cancer Son 52  . Crohn's disease Son   . Kidney failure Son   . Pancreatitis Son   . AAA (abdominal aortic aneurysm) Son   . Cancer Maternal Aunt     Social History   Socioeconomic History  . Marital status: Widowed    Spouse name: Gwyndolyn Saxon  . Number of children: 3  . Years of education: Not on file  . Highest education level: Not on file  Occupational History  . 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 Financial resource strain: Not hard at all  . Food insecurity:    Worry: Patient refused    Inability: Patient refused  . Transportation needs:    Medical: Patient refused    Non-medical: Patient refused  Tobacco Use  . Smoking status: Never Smoker  . Smokeless tobacco: Never Used  Substance and Sexual Activity  . Alcohol use: No    Alcohol/week: 0.0 standard drinks  . Drug use: No  . Sexual activity: Never  Lifestyle  . Physical activity:    Days per week: Patient refused    Minutes per session: Patient refused  . Stress: Not at all  Relationships  . Social connections:    Talks on  phone: Never    Gets together: Patient refused    Attends religious service: More than 4 times per year    Active member of club or organization: Yes    Attends meetings of clubs  or organizations: Patient refused    Relationship status: Widowed  . Intimate partner violence:    Fear of current or ex partner: Patient refused    Emotionally abused: Patient refused    Physically abused: Patient refused    Forced sexual activity: Patient refused  Other Topics Concern  . Not on file  Social History Narrative   Lives at [facility] since 09/11/12   Widowed   Never Smoked   Alcohol none   POA, DNR, Living Will         Physical Exam: BP 124/60   Pulse 66   Ht 5\' 1"  (1.549 m)   Wt 98 lb (44.5 kg)   BMI 18.52 kg/m  Constitutional: generally well-appearing Psychiatric: alert and oriented x3 Abdomen: soft, nontender, nondistended, no obvious ascites, no peritoneal signs, normal bowel sounds No peripheral edema noted in lower extremities  Assessment and plan: 83 y.o. female with achalasia, recurrent dysphasia  Botox injections have helped her in the past.  Her son is very clear about this.  I recommended repeat EGD with Botox injection now given her recurrent dysphasia.  Mainly hoping to ward off esophageal food impaction, aspirations.  We will arrange for this to be done at her soonest convenience at [facility].  She takes Xarelto for history of DVT and we will contact her primary care physician about holding that for 2 days prior to the EGD.  I see no reason for any further blood tests or imaging studies prior to then  Please see the "Patient Instructions" section for addition details about the plan.  Owens Loffler, MD [facility] 01/16/2018, 4:02 PM

## 2018-01-16 NOTE — H&P (View-Only) (Signed)
Review of pertinent gastrointestinal problems: 1. manometrically confirmed Achalasia that has previously responded to GE junction Botox  injections several times: . Dr. Olevia Perches 2015. Dr. Deatra Ina 2016. and most recently 04/2016 Dr. Ardis Hughs.  His res manometry 2015 + for achalasia.     HPI: This is a very pleasant, 84 year old woman with dementia who is here with her son today.  I last saw her about a year and a half ago.  Chief complaint is achalasia  Sherri Crawford is not really able to give any reliable history although she is quite pleasant she has significant Alzheimer's dementia.  Her son acts as her history giver.  He tells me that since this appointment was scheduled about a month ago, she has had 2 episodes of dysphasia but he is aware of.  The nursing home staff informed him of one and he witnessed another one.  He tells me his mom is unable to give any supporting history.   Blood work November 2019 hemoglobin 11.6, platelets 292,   Blood work September 1610 complete metabolic profile was normal    ROS: complete GI ROS as described in HPI, all other review negative.  Constitutional:  No unintentional weight loss   Past Medical History:  Diagnosis Date  . Abnormal weight gain 05/09/2012  . Achalasia of esophagus 09/18/2013   09/18/13 Nifedipine 10mg  tid ac meals. 11/17/14 [facility] f/u as neeed   . Anxiety and depression 03/12/2012   Increased Sertraline to 50mg  daily 07/16/13 08/26/13 Mirtazapine 7.5mg  qhs  10/12/14 Pharm decreased Sertraline to 25mg .  05/04/15 TSH 2.89 09/15/15 pharm taper off Sertraline.  10/08/15 continue Sertraline 25mg  daily, POA desires. 12/02/15 wbc 6.1, Hgb 12.0, plt 271    . Asthmatic bronchitis   . BACK PAIN, LUMBAR 03/12/2007   Hx of lower back pain, had X-ray of her lumbar spine 09/08/12--spondylosis-better controlled with Hydrocodone/ApAp  10/325mg  ac and hs  01/01/13 lumbar spine inj. Able to ambulate with walker on unit until her fall 06/22/13. 06/25/13 unremarkable. X-ray  lumbar spine. 11/10/13 dc Oxycodone prn-not used in 60 days.     . COLONIC POLYPS 03/12/2007   Initially noted April 2002 colonoscopy. Followup colonoscopy 05/31/2006 did not disclose any further polyps.    Marland Kitchen COPD (chronic obstructive pulmonary disease) ([place])   . Coronary artery disease   . Deep vein thrombosis of left lower extremity ([place]) 07/19/2012  . Dementia ([place]) 09/16/2012   05/04/15 TSH 2.89   . Depression   . Diverticulosis 05/10/2012  . DJD (degenerative joint disease)   . Dry skin dermatitis 11/18/2012  . Edema of leg 07/01/2010  . Epistaxis 01/24/2016   01/21/16: x2, held Xarelto x 1 day, Saline Nasal spray prn, wbc 9.5, Hgb 12.4, Plt 331, Na 138, K 4.5, Bun 25, creat 0.89 01/22/16 [id number]([id number])  . Esophageal dysmotility 07/26/2013  . Esophageal dysphagia 05/27/2010   New dx of achalasia 6/ 2015 09/18/13 Nifedipine 10mg  tid ac meals.    . Esophageal stricture   . Essential hypertension, benign 10/30/2013   12/02/15 wbc 6.1, Hgb 12.0, plt 271   . Essential thrombocythemia ([place]) 06/09/2009  . Fall 06/25/2013  . GERD (gastroesophageal reflux disease)   . Hip fracture ([place]) 09/08/2012  . HYPERCHOLESTEROLEMIA 03/12/2007   08/10/15 cholesterol 119, triglycerides 143, HDL 51, LDL 39   . Lumbar back pain   . MACULAR DEGENERATION 01/26/2008   Qualifier: Diagnosis of  By: Lenna Gilford MD, Deborra Medina   . Osteoarthritis 03/12/2007  . Osteoporosis 03/12/2007   Qualifier: Diagnosis of  By: Julien Girt CMA, Marliss Czar  06/15/14 dc Vit D 1000u po daily per pharm.    . Phlebitis and thrombophlebitis of other deep vessels of lower extremities 07/10/2010  . S/P balloon dilatation of esophageal stricture 07/28/2013   07/26/13. Dr. Henrene Pastor. EGD foreign removal and Balloon dilation of esophagus. Esophageal dysmotility, suspected achalasia, dilation at the GE junction to 61mm. Candida esophagitis.    . SYNCOPE 07/30/2009   Occurred 2011. Hospitalized. Felt due to dehydration.    . TRANSIENT ISCHEMIC ATTACKS, HX OF 03/12/2007   Qualifier:  Diagnosis of  By: Julien Girt CMA, Marliss Czar      Past Surgical History:  Procedure Laterality Date  . ABDOMINAL HYSTERECTOMY     and bso  . APPENDECTOMY    . BOTOX INJECTION N/A 09/18/2013   Procedure: BOTOX INJECTION;  Surgeon: Lafayette Dragon, MD;  [facility];  Service: Endoscopy;  Laterality: N/A;  . BOTOX INJECTION N/A 10/16/2014   Procedure: BOTOX INJECTION;  Surgeon: Inda Castle, MD;  [facility];  Service: Endoscopy;  Laterality: N/A;  . BOTOX INJECTION N/A 05/18/2016   Procedure: BOTOX INJECTION;  Surgeon: Milus Banister, MD;  [facility];  Service: Endoscopy;  Laterality: N/A;  . CATARACT EXTRACTION W/ INTRAOCULAR LENS  IMPLANT, BILATERAL    . CHOLECYSTECTOMY    . ESOPHAGEAL MANOMETRY N/A 08/25/2013   Procedure: ESOPHAGEAL MANOMETRY (EM);  Surgeon: Jerene Bears, MD;  [facility];  Service: Gastroenterology;  Laterality: N/A;  . ESOPHAGOGASTRODUODENOSCOPY N/A 07/26/2013   Procedure: ESOPHAGOGASTRODUODENOSCOPY (EGD);  Surgeon: Irene Shipper, MD;  Location: [facility] ENDOSCOPY;  Service: Endoscopy;  Laterality: N/A;  . ESOPHAGOGASTRODUODENOSCOPY N/A 10/16/2014   Procedure: ESOPHAGOGASTRODUODENOSCOPY (EGD);  Surgeon: Inda Castle, MD;  Location: [facility];  Service: Endoscopy;  Laterality: N/A;  with botox  . ESOPHAGOGASTRODUODENOSCOPY (EGD) WITH ESOPHAGEAL DILATION N/A 11/25/2012   Procedure: ESOPHAGOGASTRODUODENOSCOPY (EGD) WITH ESOPHAGEAL DILATION;  Surgeon: Jerene Bears, MD;  [facility];  Service: Gastroenterology;  Laterality: N/A;  . ESOPHAGOGASTRODUODENOSCOPY (EGD) WITH PROPOFOL N/A 09/18/2013   Procedure: ESOPHAGOGASTRODUODENOSCOPY (EGD) WITH PROPOFOL;  Surgeon: Lafayette Dragon, MD;  [facility];  Service: Endoscopy;  Laterality: N/A;  . ESOPHAGOGASTRODUODENOSCOPY (EGD) WITH PROPOFOL N/A 05/18/2016   Procedure: ESOPHAGOGASTRODUODENOSCOPY (EGD) WITH PROPOFOL;  Surgeon: Milus Banister, MD;  [facility];  Service:  Endoscopy;  Laterality: N/A;  . HIP ARTHROPLASTY Right 09/09/2012   Procedure: ARTHROPLASTY BIPOLAR HIP;  Surgeon: Mauri Pole, MD;  [facility];  Service: Orthopedics;  Laterality: Right;  . TAH and BSO  08/21/1995    Current Outpatient Medications  Medication Sig Dispense Refill  . acetaminophen (TYLENOL) 325 MG tablet Take 650 mg by mouth every 8 (eight) hours as needed for moderate pain.     Marland Kitchen albuterol (PROVENTIL HFA;VENTOLIN HFA) 108 (90 BASE) MCG/ACT inhaler Inhale 2 puffs into the lungs every 6 (six) hours as needed for wheezing or shortness of breath.    Marland Kitchen atorvastatin (LIPITOR) 10 MG tablet Take 10 mg by mouth at bedtime.     . CHLORHEXIDINE GLUCONATE, BULK, SOLN Take 5 mLs by mouth daily. 5 ml swish and spit for 30 seconds every day indefinitely     . cholecalciferol (VITAMIN D) 1000 units tablet Take 1,000 Units by mouth daily.    . fluticasone furoate-vilanterol (BREO ELLIPTA) 100-25 MCG/INH AEPB Inhale 1 puff into the lungs daily. One puff inhale once a day, rinse mouth after each use.     . Humidifier MISC Please use  humidifier to prevent excessive dryness. 1 each 0  . HYDROcodone-acetaminophen (NORCO) 10-325 MG tablet Take 1 tablet by mouth daily as needed. Patient may take 1  tablet twice daily.    . hydroxyurea (HYDREA) 500 MG capsule Take 500 mg by mouth See admin instructions. Takes 500 mg on Sunday, Wednesday and Friday only. Take 2 capsules to equal 1000 mg on Monday. WEAR GLOVES WHEN HANDLING THIS MEDICATION.    . memantine (NAMENDA) 10 MG tablet Take 10 mg by mouth 2 (two) times daily.     . metoprolol tartrate (LOPRESSOR) 25 MG tablet Take 25 mg by mouth 2 (two) times daily.    . montelukast (SINGULAIR) 10 MG tablet Take 10 mg by mouth at bedtime.    . Multiple Vitamins-Minerals (PRESERVISION AREDS 2) CAPS Take 1 capsule by mouth 2 (two) times daily.    Marland Kitchen NIFEdipine (PROCARDIA) 10 MG capsule Take 10 mg by mouth 3 (three) times daily. Squeeze contents of one capsule  uner tongue 15-30 minutes before meals 3 times a day     . nitroGLYCERIN (NITROSTAT) 0.4 MG SL tablet Place 0.4 mg under the tongue every 5 (five) minutes as needed for chest pain.    Marland Kitchen omeprazole (PRILOSEC) 20 MG capsule Take 20 mg by mouth at bedtime.     . Rivaroxaban (XARELTO) 15 MG TABS tablet Take 15 mg by mouth daily. 5pm    . sertraline (ZOLOFT) 25 MG tablet Take 25 mg by mouth daily.    . sodium chloride (OCEAN) 0.65 % nasal spray Place 2 sprays into the nose 2 (two) times daily.    . sodium fluoride (DENTA 5000 PLUS) 1.1 % CREA dental cream Place 1 application onto teeth at bedtime.     No current facility-administered medications for this visit.     Allergies as of 01/16/2018 - Review Complete 01/16/2018  Allergen Reaction Noted  . Sulfamethoxazole Rash 07/13/2015    Family History  Problem Relation Age of Onset  . Colon cancer Son 84  . Crohn's disease Son   . Kidney failure Son   . Pancreatitis Son   . AAA (abdominal aortic aneurysm) Son   . Cancer Maternal Aunt     Social History   Socioeconomic History  . Marital status: Widowed    Spouse name: Gwyndolyn Saxon  . Number of children: 3  . Years of education: Not on file  . Highest education level: Not on file  Occupational History  . 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 Financial resource strain: Not hard at all  . Food insecurity:    Worry: Patient refused    Inability: Patient refused  . Transportation needs:    Medical: Patient refused    Non-medical: Patient refused  Tobacco Use  . Smoking status: Never Smoker  . Smokeless tobacco: Never Used  Substance and Sexual Activity  . Alcohol use: No    Alcohol/week: 0.0 standard drinks  . Drug use: No  . Sexual activity: Never  Lifestyle  . Physical activity:    Days per week: Patient refused    Minutes per session: Patient refused  . Stress: Not at all  Relationships  . Social connections:    Talks on  phone: Never    Gets together: Patient refused    Attends religious service: More than 4 times per year    Active member of club or organization: Yes    Attends meetings of clubs  or organizations: Patient refused    Relationship status: Widowed  . Intimate partner violence:    Fear of current or ex partner: Patient refused    Emotionally abused: Patient refused    Physically abused: Patient refused    Forced sexual activity: Patient refused  Other Topics Concern  . Not on file  Social History Narrative   Lives at [facility] since 09/11/12   Widowed   Never Smoked   Alcohol none   POA, DNR, Living Will         Physical Exam: BP 124/60   Pulse 66   Ht 5\' 1"  (1.549 m)   Wt 98 lb (44.5 kg)   BMI 18.52 kg/m  Constitutional: generally well-appearing Psychiatric: alert and oriented x3 Abdomen: soft, nontender, nondistended, no obvious ascites, no peritoneal signs, normal bowel sounds No peripheral edema noted in lower extremities  Assessment and plan: 84 y.o. female with achalasia, recurrent dysphasia  Botox injections have helped her in the past.  Her son is very clear about this.  I recommended repeat EGD with Botox injection now given her recurrent dysphasia.  Mainly hoping to ward off esophageal food impaction, aspirations.  We will arrange for this to be done at her soonest convenience at [facility].  She takes Xarelto for history of DVT and we will contact her primary care physician about holding that for 2 days prior to the EGD.  I see no reason for any further blood tests or imaging studies prior to then  Please see the "Patient Instructions" section for addition details about the plan.  Owens Loffler, MD [facility] 01/16/2018, 4:02 PM

## 2018-01-16 NOTE — Patient Instructions (Addendum)
EGD at [facility] with botox injection. Known achalasia with recurrent dysphagia.  MAC sedation. Hold xarelto for 2 days prior.  Thank you for entrusting me with your care and choosing [facility].  Dr Ardis Hughs

## 2018-01-17 ENCOUNTER — Telehealth: Payer: Self-pay

## 2018-01-17 NOTE — Telephone Encounter (Signed)
-----   Message from Milus Banister, MD sent at 01/17/2018 10:42 AM EST ----- Doristine Devoid, thanks  Claiborne Billings, See below.    ----- Message ----- From: Wardell Honour, MD Sent: 01/17/2018  10:35 AM EST To: Milus Banister, MD  Usual recommendation is to D/C Xarelto 2-3 days prior to procedure

## 2018-01-17 NOTE — Telephone Encounter (Signed)
[facility]. Dr Sabra Heck has cleared  patient to hold Xarelto for 2-3 days prior to Endoscopic procedure in December.

## 2018-01-30 ENCOUNTER — Non-Acute Institutional Stay (SKILLED_NURSING_FACILITY): Payer: Medicare Other | Admitting: Nurse Practitioner

## 2018-01-30 ENCOUNTER — Encounter: Payer: Self-pay | Admitting: Nurse Practitioner

## 2018-01-30 DIAGNOSIS — J449 Chronic obstructive pulmonary disease, unspecified: Secondary | ICD-10-CM | POA: Diagnosis not present

## 2018-01-30 DIAGNOSIS — I82402 Acute embolism and thrombosis of unspecified deep veins of left lower extremity: Secondary | ICD-10-CM | POA: Diagnosis not present

## 2018-01-30 DIAGNOSIS — K222 Esophageal obstruction: Secondary | ICD-10-CM | POA: Diagnosis not present

## 2018-01-30 DIAGNOSIS — I119 Hypertensive heart disease without heart failure: Secondary | ICD-10-CM

## 2018-01-30 DIAGNOSIS — F419 Anxiety disorder, unspecified: Secondary | ICD-10-CM

## 2018-01-30 DIAGNOSIS — Z7901 Long term (current) use of anticoagulants: Secondary | ICD-10-CM

## 2018-01-30 DIAGNOSIS — F329 Major depressive disorder, single episode, unspecified: Secondary | ICD-10-CM

## 2018-01-30 DIAGNOSIS — M150 Primary generalized (osteo)arthritis: Secondary | ICD-10-CM

## 2018-01-30 DIAGNOSIS — D473 Essential (hemorrhagic) thrombocythemia: Secondary | ICD-10-CM

## 2018-01-30 DIAGNOSIS — M159 Polyosteoarthritis, unspecified: Secondary | ICD-10-CM

## 2018-01-30 DIAGNOSIS — M8949 Other hypertrophic osteoarthropathy, multiple sites: Secondary | ICD-10-CM

## 2018-01-30 DIAGNOSIS — F0280 Dementia in other diseases classified elsewhere without behavioral disturbance: Secondary | ICD-10-CM

## 2018-01-30 DIAGNOSIS — G301 Alzheimer's disease with late onset: Secondary | ICD-10-CM

## 2018-01-30 DIAGNOSIS — F32A Depression, unspecified: Secondary | ICD-10-CM

## 2018-01-30 DIAGNOSIS — J4489 Other specified chronic obstructive pulmonary disease: Secondary | ICD-10-CM

## 2018-01-30 NOTE — Assessment & Plan Note (Signed)
Stable, last plt 292 01/01/18, continue Hydroxyurea

## 2018-01-30 NOTE — Assessment & Plan Note (Signed)
Stable, continue Rivaroxaban 15mg  qd.

## 2018-01-30 NOTE — Progress Notes (Signed)
Location:  [facility] Room Number: 35 Place of Service:  SNF (31) Provider:  Marlana Latus  NP  Nazirah Tri X, NP  Patient Care Team: Luceal Hollibaugh X, NP as PCP - General (Internal Medicine) Annia Belt, MD as Consulting Physician (Hematology and Oncology) Suella Broad, MD as Consulting Physician (Physical Medicine and Rehabilitation) Noralee Space, MD (Pulmonary Disease) [place] Skyelar Halliday X, NP as Nurse Practitioner (Nurse Practitioner) Inda Castle, MD (Inactive) as Consulting Physician (Gastroenterology)  Extended Emergency Contact Information Primary Emergency Contact: Leonhart,Gene Address: [address]          [place], [place] [address] [place] Phone: [phone] Work Phone: [phone] Mobile Phone: [phone] Relation: Son Secondary Emergency Contact: [name], [place] Phone: [phone] Relation: Son  Code Status:  DNR Goals of care: Advanced Directive information Advanced Directives 01/04/2018  Does Patient Have a Medical Advance Directive? Yes  Type of Paramedic of [place];Living will;Out of facility DNR (pink MOST or yellow form)  Does patient want to make changes to medical advance directive? No - Patient declined  Copy of [place] in Chart? Yes - validated most recent copy scanned in chart (See row information)  Pre-existing out of facility DNR order (yellow form or pink MOST form) Yellow form placed in chart (order not valid for inpatient use)     Chief Complaint  Patient presents with  . Medical Management of Chronic Issues    F/u- COPD, GERD, alzheimers, HTN, hyperlipidemia,     HPI:  Pt is a 84 y.o. female seen today for medical management of chronic diseases.     The patient has history of esophogeal strictures, s/p dilatation, on Nifedipine 10mg  tid, Omeprazole 20mg  qd,  f/u GI. Her mood is stable on Sertraline 25mg  qd. Hx of DVTs, chronic anticoagulation therapy with Rivaroxaban 15mg  qd. Stable asthma, on Montelukast 10mg  qd, Fluticasone Furoate Vilanterol 100/25mg  qd, and prn Albuterol 2 puffs q6h. Chronic lower back pain is managed with Norco 10/325mg  bid and qd prn. Blood pressure is controlled on Metoprolol 25mg  bid. She resides in [facility] for safety and care assistance, on Memantine 10mg  bid for memory. Essential thrombocythemia, stable on Hydroxyurea.  Past Medical History:  Diagnosis Date  . Abnormal weight gain 05/09/2012  . Achalasia of esophagus 09/18/2013   09/18/13 Nifedipine 10mg  tid ac meals. 11/17/14 GI [facility] f/u as neeed   . Anxiety and depression 03/12/2012   Increased Sertraline to 50mg  daily 07/16/13 08/26/13 Mirtazapine 7.5mg  qhs  10/12/14 Pharm decreased Sertraline to 25mg .  05/04/15 TSH 2.89 09/15/15 pharm taper off Sertraline.  10/08/15 continue Sertraline 25mg  daily, POA desires. 12/02/15 wbc 6.1, Hgb 12.0, plt 271    . Asthmatic bronchitis   . BACK PAIN, LUMBAR 03/12/2007   Hx of lower back pain, had X-ray of her lumbar spine 09/08/12--spondylosis-better controlled with Hydrocodone/ApAp  10/325mg  ac and hs  01/01/13 lumbar spine inj. Able to ambulate with walker on unit until her fall 06/22/13. 06/25/13 unremarkable. X-ray lumbar spine. 11/10/13 dc Oxycodone prn-not used in 60 days.     . COLONIC POLYPS 03/12/2007   Initially noted April 2002 colonoscopy. Followup colonoscopy 05/31/2006 did not disclose any further polyps.    Marland Kitchen COPD (chronic obstructive pulmonary disease) ([place])   . Coronary artery disease   . Deep vein thrombosis of left lower extremity ([place])  07/19/2012  . Dementia ([place]) 09/16/2012   05/04/15 TSH 2.89   . Depression   . Diverticulosis 05/10/2012  . DJD (degenerative joint disease)   . Dry skin dermatitis 11/18/2012  . Edema of leg 07/01/2010  . Epistaxis 01/24/2016   01/21/16: x2, held Xarelto x 1 day, Saline Nasal spray prn, wbc 9.5, Hgb 12.4,  Plt 331, Na 138, K 4.5, Bun 25, creat 0.89 01/22/16 PZW25(85-27)  . Esophageal dysmotility 07/26/2013  . Esophageal dysphagia 05/27/2010   New dx of achalasia 6/ 2015 09/18/13 Nifedipine 10mg  tid ac meals.    . Esophageal stricture   . Essential hypertension, benign 10/30/2013   12/02/15 wbc 6.1, Hgb 12.0, plt 271   . Essential thrombocythemia ([place]) 06/09/2009  . Fall 06/25/2013  . GERD (gastroesophageal reflux disease)   . Hip fracture ([place]) 09/08/2012  . HYPERCHOLESTEROLEMIA 03/12/2007   08/10/15 cholesterol 119, triglycerides 143, HDL 51, LDL 39   . Lumbar back pain   . MACULAR DEGENERATION 01/26/2008   Qualifier: Diagnosis of  By: Lenna Gilford MD, Deborra Medina   . Osteoarthritis 03/12/2007  . Osteoporosis 03/12/2007   Qualifier: Diagnosis of  By: Julien Girt CMA, Marliss Czar  06/15/14 dc Vit D 1000u po daily per pharm.    . Phlebitis and thrombophlebitis of other deep vessels of lower extremities 07/10/2010  . S/P balloon dilatation of esophageal stricture 07/28/2013   07/26/13. Dr. Henrene Pastor. EGD foreign removal and Balloon dilation of esophagus. Esophageal dysmotility, suspected achalasia, dilation at the GE junction to 32mm. Candida esophagitis.    . SYNCOPE 07/30/2009   Occurred 2011. Hospitalized. Felt due to dehydration.    . TRANSIENT ISCHEMIC ATTACKS, HX OF 03/12/2007   Qualifier: Diagnosis of  By: Julien Girt CMA, Marliss Czar     Past Surgical History:  Procedure Laterality Date  . ABDOMINAL HYSTERECTOMY     and bso  . APPENDECTOMY    . BOTOX INJECTION N/A 09/18/2013   Procedure: BOTOX INJECTION;  Surgeon: Lafayette Dragon, MD;  Location: [facility] ENDOSCOPY;  Service: Endoscopy;  Laterality: N/A;  . BOTOX INJECTION N/A 10/16/2014   Procedure: BOTOX INJECTION;  Surgeon: Inda Castle, MD;  Location: [facility] ENDOSCOPY;  Service: Endoscopy;  Laterality: N/A;  . BOTOX INJECTION N/A 05/18/2016   Procedure: BOTOX INJECTION;  Surgeon: Milus Banister, MD;  Location: [facility] ENDOSCOPY;  Service: Endoscopy;  Laterality: N/A;  . CATARACT EXTRACTION W/  INTRAOCULAR LENS  IMPLANT, BILATERAL    . CHOLECYSTECTOMY    . ESOPHAGEAL MANOMETRY N/A 08/25/2013   Procedure: ESOPHAGEAL MANOMETRY (EM);  Surgeon: Jerene Bears, MD;  Location: [facility] ENDOSCOPY;  Service: Gastroenterology;  Laterality: N/A;  . ESOPHAGOGASTRODUODENOSCOPY N/A 07/26/2013   Procedure: ESOPHAGOGASTRODUODENOSCOPY (EGD);  Surgeon: Irene Shipper, MD;  Location[facility] ENDOSCOPY;  Service: Endoscopy;  Laterality: N/A;  . ESOPHAGOGASTRODUODENOSCOPY N/A 10/16/2014   Procedure: ESOPHAGOGASTRODUODENOSCOPY (EGD);  Surgeon: Inda Castle, MD;  Location: [facility];  Service: Endoscopy;  Laterality: N/A;  with botox  . ESOPHAGOGASTRODUODENOSCOPY (EGD) WITH ESOPHAGEAL DILATION N/A 11/25/2012   Procedure: ESOPHAGOGASTRODUODENOSCOPY (EGD) WITH ESOPHAGEAL DILATION;  Surgeon: Jerene Bears, MD;  Location: [facility] ENDOS[facility];  Service: Gastroenterology;  Laterality: N/A;  . ESOPHAGOGASTRODUODENOSCOPY (EGD) WITH PROPOFOL N/A 09/18/2013   Procedure: ESOPHAGOGASTRODUODENOSCOPY (EGD) WITH PROPOFOL;  Surgeon: Lafayette Dragon, MD;  Location: [facility] ENDOS[facility];  Service: Endoscopy;  Laterality: N/A;  . ESOPHAGOGASTRODUODENOSCOPY (EGD) WITH PROPOFOL N/A 05/18/2016   Procedure: ESOPHAGOGASTRODUODENOSCOPY (EGD) WITH PROPOFOL;  Surgeon: Milus Banister, MD;  Location: [facility] ENDOSCOPY;  Service: Endoscopy;  Laterality: N/A;  .  HIP ARTHROPLASTY Right 09/09/2012   Procedure: ARTHROPLASTY BIPOLAR HIP;  Surgeon: Mauri Pole, MD;  Location: [facility] ORS;  Service: Orthopedics;  Laterality: Right;  . TAH and BSO  08/21/1995    Allergies  Allergen Reactions  . Sulfamethoxazole Rash    Outpatient Encounter Medications as of 01/30/2018  Medication Sig  . acetaminophen (TYLENOL) 325 MG tablet Take 650 mg by mouth every 8 (eight) hours as needed for moderate pain.   Marland Kitchen albuterol (PROVENTIL HFA;VENTOLIN HFA) 108 (90 BASE) MCG/ACT inhaler Inhale 2 puffs into the lungs every 6 (six) hours as needed for wheezing or shortness of breath.  Marland Kitchen atorvastatin  (LIPITOR) 10 MG tablet Take 10 mg by mouth at bedtime.   . chlorhexidine (PERIDEX) 0.12 % solution Use as directed 5 mLs in the mouth or throat daily. SWISH AND SPIT 5ML FOR 30 SECONDS EVERYDAY INDEFINITELY  . cholecalciferol (VITAMIN D) 1000 units tablet Take 1,000 Units by mouth daily.  . fluticasone furoate-vilanterol (BREO ELLIPTA) 100-25 MCG/INH AEPB Inhale 1 puff into the lungs daily. One puff inhale once a day, rinse mouth after each use.   . Humidifier MISC Please use humidifier to prevent excessive dryness.  Marland Kitchen HYDROcodone-acetaminophen (NORCO) 10-325 MG tablet Take 1 tablet by mouth daily as needed. Patient may take 1  tablet twice daily.  Marland Kitchen HYDROcodone-acetaminophen (NORCO) 10-325 MG tablet Take 1 tablet by mouth 2 (two) times daily.  . hydroxyurea (HYDREA) 500 MG capsule Take 500 mg by mouth See admin instructions. Takes 500 mg on Sunday, Wednesday and Friday only. Take 2 capsules to equal 1000 mg on Monday. WEAR GLOVES WHEN HANDLING THIS MEDICATION.  . memantine (NAMENDA) 10 MG tablet Take 10 mg by mouth 2 (two) times daily.   . metoprolol tartrate (LOPRESSOR) 25 MG tablet Take 25 mg by mouth 2 (two) times daily.  . montelukast (SINGULAIR) 10 MG tablet Take 10 mg by mouth at bedtime.  . Multiple Vitamins-Minerals (PRESERVISION AREDS 2) CAPS Take 1 capsule by mouth 2 (two) times daily.  Marland Kitchen NIFEdipine (PROCARDIA) 10 MG capsule Take 10 mg by mouth 3 (three) times daily. Squeeze contents of one capsule uner tongue 15-30 minutes before meals 3 times a day   . nitroGLYCERIN (NITROSTAT) 0.4 MG SL tablet Place 0.4 mg under the tongue every 5 (five) minutes as needed for chest pain.  Marland Kitchen omeprazole (PRILOSEC) 20 MG capsule Take 20 mg by mouth at bedtime.   . Rivaroxaban (XARELTO) 15 MG TABS tablet Take 15 mg by mouth daily. 5pm  . sertraline (ZOLOFT) 25 MG tablet Take 25 mg by mouth daily.  . sodium chloride (OCEAN) 0.65 % nasal spray Place 2 sprays into the nose 2 (two) times daily.  . sodium  fluoride (DENTA 5000 PLUS) 1.1 % CREA dental cream Place 1 application onto teeth at bedtime.  . [DISCONTINUED] CHLORHEXIDINE GLUCONATE, BULK, SOLN Take 5 mLs by mouth daily. 5 ml swish and spit for 30 seconds every day indefinitely    No facility-administered encounter medications on file as of 01/30/2018.    ROS was provided with assistance of staff Review of Systems  Constitutional: Negative for activity change, appetite change, chills, diaphoresis, fatigue, fever and unexpected weight change.  HENT: Positive for hearing loss and trouble swallowing. Negative for congestion and voice change.   Respiratory: Negative for cough, shortness of breath and wheezing.   Cardiovascular: Negative for chest pain, palpitations and leg swelling.  Gastrointestinal: Negative for abdominal distention, abdominal pain, constipation, diarrhea, nausea and vomiting.  Genitourinary:  Negative for difficulty urinating, dysuria and urgency.  Musculoskeletal: Positive for arthralgias, back pain and gait problem.  Skin: Negative for color change and pallor.  Neurological: Negative for dizziness, speech difficulty, weakness and headaches.       Dementia  Psychiatric/Behavioral: Negative for agitation, behavioral problems, hallucinations and sleep disturbance. The patient is not nervous/anxious.     Immunization History  Administered Date(s) Administered  . Influenza Split 11/16/2010, 12/13/2011  . Influenza Whole 11/27/2009, 11/29/2017  . Influenza-Unspecified 12/31/2013, 11/18/2014, 12/14/2015, 12/11/2016  . PPD Test 09/11/2012  . Pneumococcal Polysaccharide-23 02/28/1988  . Tdap 05/31/2017   Pertinent  Health Maintenance Due  Topic Date Due  . PNA vac Low Risk Adult (2 of 2 - PCV13) 02/27/1989  . INFLUENZA VACCINE  Completed  . DEXA SCAN  Completed   Fall Risk  02/08/2017 02/08/2017 10/04/2015 12/28/2014 10/23/2014  Falls in the past year? No Exclusion - non ambulatory No Yes Yes  Number falls in past yr: -  - - 1 1  Injury with Fall? - - - No No  Risk for fall due to : - Impaired balance/gait;Impaired vision Impaired mobility;Impaired balance/gait History of fall(s);Impaired balance/gait;Impaired mobility Impaired mobility  Follow up - - - Falls prevention discussed Falls evaluation completed   Functional Status Survey:    Vitals:   01/30/18 1006  BP: 126/70  Pulse: 80  Resp: 18  Temp: 97.8 F (36.6 C)  SpO2: 94%  Weight: 101 lb (45.8 kg)  Height: 5\' 1"  (1.549 m)   Body mass index is 19.08 kg/m. Physical Exam  Constitutional: She appears well-developed and well-nourished.  HENT:  Head: Normocephalic and atraumatic.  Eyes: Pupils are equal, round, and reactive to light. EOM are normal.  Neck: Normal range of motion. Neck supple. No JVD present. No thyromegaly present.  Cardiovascular: Normal rate and regular rhythm.  No murmur heard. Pulmonary/Chest: She has no wheezes. She has no rales.  Decreased air entry  Abdominal: Soft. She exhibits no distension. There is no tenderness. There is no rebound and no guarding.  Musculoskeletal: She exhibits no edema.  Kyphoscoliosis. W/c to get around.   Neurological: She is alert. No cranial nerve deficit. She exhibits normal muscle tone. Coordination normal.  Oriented to person and place.   Skin: Skin is warm and dry.  Psychiatric: She has a normal mood and affect. Her behavior is normal.    Labs reviewed: Recent Labs    09/18/17 11/21/17  NA 142 145  K 3.8 4.0  CL 104 111  CO2 30 29  BUN 21 17  CREATININE 0.8 0.7  CALCIUM 9.4 8.5  MG 1.9  --    Recent Labs    09/18/17 11/21/17  AST 19 12*  ALT 13 10  ALKPHOS 71 66  PROT 6.8 5.1  ALBUMIN 4.2 3.2   Recent Labs    07/03/17  10/29/17 11/21/17 01/01/18  WBC 6.9   < > 6.3 5.7 5.6  NEUTROABS 4,264  --   --   --   --   HGB 12.3   < > 12.3 11.2* 11.6*  HCT 36   < > 36 33* 34*  PLT 269   < > 264 333 292   < > = values in this interval not displayed.   Lab Results    Component Value Date   TSH 2.00 09/21/2016   Lab Results  Component Value Date   HGBA1C 5.5 10/16/2014   Lab Results  Component Value Date   CHOL 143  09/18/2017   HDL 50 09/18/2017   LDLCALC 70 09/18/2017   LDLDIRECT 152.3 06/04/2009   TRIG 147 09/18/2017   CHOLHDL 3 09/13/2011    Significant Diagnostic Results in last 30 days:  No results found.  Assessment/Plan Hypertensive heart disease Blood pressure is controlled, continue Metoprolol 25mg  bid.   Deep vein thrombosis of left lower extremity Stable, continue Rivaroxaban 15mg  qd.   COPD with asthma ([place]) Stable, continue Montelukast 10mg  qd, Fluticasone Furoate Vilanterol 100/25mg  qd, prn Albuterol 2 puffs q6h.  Benign esophageal stricture F/u GI, continue Nifedipine 10mg  tid, Omeprazole 20mg  qd.   Alzheimer's dementia without behavioral disturbance Continue SNF [facility] for safety and care assistance, continue Memantine 10mg  bid.   Osteoarthritis Chronic lower back pain, managed with Norco 10/325mg  bid, w/c  Essential thrombocythemia ([place]) Stable, last plt 292 01/01/18, continue Hydroxyurea  Long term current use of anticoagulant therapy For recurrent DVTs, continue Rivaroxaban 15mg  qd.   Anxiety and depression Her mood is stable, continue Sertraline 25mg  qd.      Family/ staff Communication: plan of care reviewed with the patient and charge nurse.   Labs/tests ordered:  none  Time spend 25 minutes.

## 2018-01-30 NOTE — Assessment & Plan Note (Signed)
Continue SNF [facility] for safety and care assistance, continue Memantine 10mg bid.  

## 2018-01-30 NOTE — Assessment & Plan Note (Signed)
Stable, continue Montelukast 10mg  qd, Fluticasone Furoate Vilanterol 100/25mg  qd, prn Albuterol 2 puffs q6h.

## 2018-01-30 NOTE — Assessment & Plan Note (Signed)
Her mood is stable, continue Sertraline 25mg qd.  

## 2018-01-30 NOTE — Assessment & Plan Note (Signed)
Chronic lower back pain, managed with Norco 10/325mg  bid, w/c

## 2018-01-30 NOTE — Assessment & Plan Note (Addendum)
F/u GI, continue Nifedipine 10mg  tid, Omeprazole 20mg  qd.

## 2018-01-30 NOTE — Assessment & Plan Note (Signed)
For recurrent DVTs, continue Rivaroxaban 15mg  qd.

## 2018-01-30 NOTE — Assessment & Plan Note (Signed)
Blood pressure is controlled, continue Metoprolol 25mg bid.  

## 2018-02-01 ENCOUNTER — Encounter: Payer: Self-pay | Admitting: Nurse Practitioner

## 2018-02-12 ENCOUNTER — Encounter (HOSPITAL_COMMUNITY): Payer: Self-pay | Admitting: *Deleted

## 2018-02-12 ENCOUNTER — Other Ambulatory Visit: Payer: Self-pay

## 2018-02-12 NOTE — Progress Notes (Addendum)
Preop instructions for:   Sherri Crawford                       Date of Birth: Feb 02, 1921                             Date of Procedure:02/14/18       Doctor: Ardis Hughs  Time to arrive at [facility]: 0830am Report to: Admitting  Procedure: EGD  Any procedu re time changes, MD office will notify you!   Do not eat or drink past midnight the night before your procedure.(To include any tube feedings-must be discontinued) Reminder:Follow bowel prep instructions per MD office!   Take these morning medications only with sips of water.(or give through gastrostomy or feeding tube). Inhalers as usual and bring, Namenda ( if takes in am), Metoprolol ,  , Zoloft ( if takes in am)       Facility contact:  [facility]                 Phone:  [place]: Memori Sammon- [phone]  Transportation contact phone#: [facility]  Please send day of procedure:current med list and meds last taken that day, confirm nothing by mouth status from what time, Patient Demographic info( to include DNR status, problem list, allergies)   RN contact name/phone#: [facility]                             and Fax #:[phone]  Bring Insurance card and picture ID Leave all jewelry and other valuables at place where living( no metal or rings to be worn) No contact lens Women-no make-up, no lotions,perfumes,powders   Any questions day of procedure,call Endoscopy unit-[phone]!   Sent from :[facility]                   [place]                   Fax:[phone]  Sent by :Gillian Shields RN

## 2018-02-14 ENCOUNTER — Encounter (HOSPITAL_COMMUNITY): Payer: Self-pay | Admitting: Registered Nurse

## 2018-02-14 ENCOUNTER — Encounter (HOSPITAL_COMMUNITY)
Admission: RE | Disposition: A | Discharge status: Home / Self Care | Payer: Self-pay | Source: Home / Self Care | Attending: Gastroenterology

## 2018-02-14 ENCOUNTER — Other Ambulatory Visit: Payer: Self-pay

## 2018-02-14 ENCOUNTER — Ambulatory Visit (HOSPITAL_COMMUNITY): Payer: Medicare Other | Admitting: Registered Nurse

## 2018-02-14 ENCOUNTER — Ambulatory Visit (HOSPITAL_COMMUNITY)
Admission: RE | Admit: 2018-02-14 | Discharge: 2018-02-14 | Disposition: A | Discharge status: Home / Self Care | Payer: Medicare Other | Attending: Gastroenterology | Admitting: Gastroenterology

## 2018-02-14 DIAGNOSIS — K220 Achalasia of cardia: Secondary | ICD-10-CM | POA: Diagnosis not present

## 2018-02-14 DIAGNOSIS — F0280 Dementia in other diseases classified elsewhere without behavioral disturbance: Secondary | ICD-10-CM | POA: Diagnosis not present

## 2018-02-14 DIAGNOSIS — I2510 Atherosclerotic heart disease of native coronary artery without angina pectoris: Secondary | ICD-10-CM | POA: Diagnosis not present

## 2018-02-14 DIAGNOSIS — I10 Essential (primary) hypertension: Secondary | ICD-10-CM | POA: Diagnosis not present

## 2018-02-14 DIAGNOSIS — F329 Major depressive disorder, single episode, unspecified: Secondary | ICD-10-CM | POA: Insufficient documentation

## 2018-02-14 DIAGNOSIS — J449 Chronic obstructive pulmonary disease, unspecified: Secondary | ICD-10-CM | POA: Diagnosis not present

## 2018-02-14 DIAGNOSIS — Z8673 Personal history of transient ischemic attack (TIA), and cerebral infarction without residual deficits: Secondary | ICD-10-CM | POA: Insufficient documentation

## 2018-02-14 DIAGNOSIS — Z66 Do not resuscitate: Secondary | ICD-10-CM | POA: Diagnosis not present

## 2018-02-14 DIAGNOSIS — Z79899 Other long term (current) drug therapy: Secondary | ICD-10-CM | POA: Insufficient documentation

## 2018-02-14 DIAGNOSIS — F419 Anxiety disorder, unspecified: Secondary | ICD-10-CM | POA: Insufficient documentation

## 2018-02-14 DIAGNOSIS — Z86718 Personal history of other venous thrombosis and embolism: Secondary | ICD-10-CM | POA: Diagnosis not present

## 2018-02-14 DIAGNOSIS — M1990 Unspecified osteoarthritis, unspecified site: Secondary | ICD-10-CM | POA: Diagnosis not present

## 2018-02-14 DIAGNOSIS — E785 Hyperlipidemia, unspecified: Secondary | ICD-10-CM | POA: Diagnosis not present

## 2018-02-14 DIAGNOSIS — G309 Alzheimer's disease, unspecified: Secondary | ICD-10-CM | POA: Diagnosis not present

## 2018-02-14 DIAGNOSIS — Z7901 Long term (current) use of anticoagulants: Secondary | ICD-10-CM | POA: Insufficient documentation

## 2018-02-14 DIAGNOSIS — K317 Polyp of stomach and duodenum: Secondary | ICD-10-CM | POA: Insufficient documentation

## 2018-02-14 DIAGNOSIS — K219 Gastro-esophageal reflux disease without esophagitis: Secondary | ICD-10-CM | POA: Insufficient documentation

## 2018-02-14 DIAGNOSIS — D473 Essential (hemorrhagic) thrombocythemia: Secondary | ICD-10-CM | POA: Diagnosis not present

## 2018-02-14 DIAGNOSIS — K222 Esophageal obstruction: Secondary | ICD-10-CM

## 2018-02-14 HISTORY — PX: ESOPHAGOGASTRODUODENOSCOPY (EGD) WITH PROPOFOL: SHX5813

## 2018-02-14 HISTORY — PX: BOTOX INJECTION: SHX5754

## 2018-02-14 SURGERY — ESOPHAGOGASTRODUODENOSCOPY (EGD) WITH PROPOFOL
Anesthesia: Monitor Anesthesia Care

## 2018-02-14 MED ORDER — LIDOCAINE HCL (CARDIAC) PF 100 MG/5ML IV SOSY
PREFILLED_SYRINGE | INTRAVENOUS | Status: DC | PRN
  Administered 2018-02-14: 40 mg via INTRAVENOUS

## 2018-02-14 MED ORDER — SODIUM CHLORIDE (PF) 0.9 % IJ SOLN
INTRAMUSCULAR | Status: DC | PRN
  Administered 2018-02-14: 4 mL via SUBMUCOSAL

## 2018-02-14 MED ORDER — GLYCOPYRROLATE 0.2 MG/ML IJ SOLN
INTRAMUSCULAR | Status: DC | PRN
  Administered 2018-02-14: 0.1 mg via INTRAVENOUS

## 2018-02-14 MED ORDER — PROPOFOL 10 MG/ML IV BOLUS
INTRAVENOUS | Status: AC
  Filled 2018-02-14: qty 40

## 2018-02-14 MED ORDER — ONABOTULINUMTOXINA 100 UNITS IJ SOLR
INTRAMUSCULAR | Status: AC
  Filled 2018-02-14: qty 100

## 2018-02-14 MED ORDER — SODIUM CHLORIDE (PF) 0.9 % IJ SOLN
INTRAMUSCULAR | Status: AC
  Filled 2018-02-14: qty 10

## 2018-02-14 MED ORDER — SODIUM CHLORIDE 0.9 % IV SOLN: INTRAVENOUS | Status: DC

## 2018-02-14 MED ORDER — LACTATED RINGERS IV SOLN
INTRAVENOUS | Status: DC
  Administered 2018-02-14: 09:00:00 via INTRAVENOUS

## 2018-02-14 MED ORDER — PROPOFOL 500 MG/50ML IV EMUL
INTRAVENOUS | Status: DC | PRN
  Administered 2018-02-14: 150 ug/kg/min via INTRAVENOUS

## 2018-02-14 SURGICAL SUPPLY — 14 items

## 2018-02-14 NOTE — Anesthesia Postprocedure Evaluation (Signed)
Anesthesia Post Note  Patient: Sherri Crawford  Procedure(s) Performed: ESOPHAGOGASTRODUODENOSCOPY (EGD) WITH PROPOFOL (N/A ) BOTOX INJECTION (N/A )     Patient location during evaluation: PACU Anesthesia Type: MAC Level of consciousness: awake and alert Pain management: pain level controlled Vital Signs Assessment: post-procedure vital signs reviewed and stable Respiratory status: spontaneous breathing, nonlabored ventilation, respiratory function stable and patient connected to nasal cannula oxygen Cardiovascular status: stable and blood pressure returned to baseline Postop Assessment: no apparent nausea or vomiting Anesthetic complications: no    Last Vitals:  Vitals:   02/14/18 1011 02/14/18 1038  BP: (!) 140/38 (!) 180/55  Pulse: (!) 55 (!) 56  Resp: 16 18  Temp: 36.7 C   SpO2: 100% 99%    Last Pain:  Vitals:   02/14/18 1011  TempSrc: Oral                 Effie Berkshire

## 2018-02-14 NOTE — Op Note (Signed)
[facility] Patient Name: Sherri Crawford Procedure Date: 02/14/2018 MRN: [id number] Attending MD: Milus Banister , MD Date of Birth: 27-Feb-1921 CSN: [id number] Age: 84 Admit Type: Outpatient Procedure:                Upper GI endoscopy Indications:              For management of achalasia; manometrically                            confirmed Achalasia that has previously responded                            to GE junction Botox injections several times: .                            Dr. Olevia Perches 2015. Dr. Deatra Ina 2016. and most recently                            04/2016 Dr. Ardis Hughs. His res manometry 2015 + for                            achalasia. Providers:                Milus Banister, MD, Cleda Daub, RN, Cherylynn Ridges, Technician, Enrigue Catena, CRNA Referring MD:              Medicines:                Monitored Anesthesia Care Complications:            No immediate complications. Estimated blood loss:                            None. Estimated Blood Loss:     Estimated blood loss: none. Procedure:                Pre-Anesthesia Assessment:                           - Prior to the procedure, a History and Physical                            was performed, and patient medications and                            allergies were reviewed. The patient's tolerance of                            previous anesthesia was also reviewed. The risks                            and benefits of the procedure and the sedation  options and risks were discussed with the patient.                            All questions were answered, and informed consent                            was obtained. Prior Anticoagulants: The patient has                            taken Xarelto (rivaroxaban), last dose was 2 days                            prior to procedure. ASA Grade Assessment: IV - A                            patient with severe  systemic disease that is a                            constant threat to life. After reviewing the risks                            and benefits, the patient was deemed in                            satisfactory condition to undergo the procedure.                           After obtaining informed consent, the endoscope was                            passed under direct vision. Throughout the                            procedure, the patient's blood pressure, pulse, and                            oxygen saturations were monitored continuously. The                            GIF-H190 ([id number]) Olympus adult endoscope was                            introduced through the mouth, and advanced to the                            second part of duodenum. The upper GI endoscopy was                            accomplished without difficulty. The patient                            tolerated the procedure well. Scope In: Scope Out: Findings:      Snug GE junction with normal  mucosa. I was able to advance the adult       gastroscope through the GE junction with only very mild resistence. The       GE junction was injected with BOTOX per usual protocol (25units in each       of four quadrants, just proximal to the GE junction).      Several small, clearly benign mucosal polyps throughout the stomach.      The exam was otherwise without abnormality. Impression:               - Snug GE junction with normal mucosa. I was able                            to advance the adult gastroscope through the GE                            junction with only very mild resistence. The GE                            junction was injected with BOTOX per usual protocol                            (25units in each of four quadrants just proximal to                            the GE junction).                           - Several small, clearly benign mucosal polyps                            throughout the stomach. Moderate  Sedation:      Not Applicable - Patient had care per Anesthesia. Recommendation:           - Patient has a contact number available for                            emergencies. The signs and symptoms of potential                            delayed complications were discussed with the                            patient. Return to normal activities tomorrow.                            Written discharge instructions were provided to the                            patient.                           - Resume previous diet.                           - Continue present medications.                           -  Repeat upper endoscopy PRN for retreatment. Procedure Code(s):        --- Professional ---                           [phone], Esophagogastroduodenoscopy, flexible,                            transoral; with directed submucosal injection(s),                            any substance Diagnosis Code(s):        --- Professional ---                           K22.2, Esophageal obstruction                           K22.0, Achalasia of cardia CPT copyright 2018 [employer]. All rights reserved. The codes documented in this report are preliminary and upon coder review may  be revised to meet current compliance requirements. Milus Banister, MD 02/14/2018 10:07:21 AM This report has been signed electronically. Number of Addenda: 0

## 2018-02-14 NOTE — Interval H&P Note (Signed)
History and Physical Interval Note:  02/14/2018 8:13 AM  Sherri Crawford  has presented today for surgery, with the diagnosis of dysphagia  The various methods of treatment have been discussed with the patient and family. After consideration of risks, benefits and other options for treatment, the patient has consented to  Procedure(s): ESOPHAGOGASTRODUODENOSCOPY (EGD) WITH PROPOFOL (N/A) BOTOX INJECTION (N/A) as a surgical intervention .  The patient's history has been reviewed, patient examined, no change in status, stable for surgery.  I have reviewed the patient's chart and labs.  Questions were answered to the patient's satisfaction.     Milus Banister

## 2018-02-14 NOTE — Anesthesia Preprocedure Evaluation (Addendum)
Anesthesia Evaluation  Patient identified by MRN, date of birth, ID band Patient awake    Reviewed: Allergy & Precautions, NPO status , Patient's Chart, lab work & pertinent test results  Airway Mallampati: II  TM Distance: >3 FB Neck ROM: Full    Dental  (+) Teeth Intact, Dental Advisory Given   Pulmonary asthma , COPD,    breath sounds clear to auscultation       Cardiovascular hypertension, + CAD   Rhythm:Regular Rate:Normal     Neuro/Psych Anxiety Depression Dementia    GI/Hepatic Neg liver ROS, GERD  ,  Endo/Other  negative endocrine ROS  Renal/GU negative Renal ROS     Musculoskeletal  (+) Arthritis ,   Abdominal Normal abdominal exam  (+)   Peds  Hematology   Anesthesia Other Findings   Reproductive/Obstetrics                            Anesthesia Physical Anesthesia Plan  ASA: III  Anesthesia Plan: MAC   Post-op Pain Management:    Induction: Intravenous  PONV Risk Score and Plan: Propofol infusion and Treatment may vary due to age or medical condition  Airway Management Planned: Natural Airway and Nasal Cannula  Additional Equipment: None  Intra-op Plan:   Post-operative Plan:   Informed Consent: I have reviewed the patients History and Physical, chart, labs and discussed the procedure including the risks, benefits and alternatives for the proposed anesthesia with the patient or authorized representative who has indicated his/her understanding and acceptance.     Plan Discussed with: CRNA  Anesthesia Plan Comments:         Anesthesia Quick Evaluation

## 2018-02-14 NOTE — Transfer of Care (Signed)
Immediate Anesthesia Transfer of Care Note  Patient: Sherri Crawford  Procedure(s) Performed: ESOPHAGOGASTRODUODENOSCOPY (EGD) WITH PROPOFOL (N/A ) BOTOX INJECTION (N/A )  Patient Location: PACU  Anesthesia Type:MAC  Level of Consciousness: sedated and patient cooperative  Airway & Oxygen Therapy: Patient Spontanous Breathing and Patient connected to nasal cannula oxygen  Post-op Assessment: Report given to RN and Post -op Vital signs reviewed and stable  Post vital signs: stable  Last Vitals:  Vitals Value Taken Time  BP 140/38 02/14/2018 10:11 AM  Temp 36.7 C 02/14/2018 10:11 AM  Pulse 51 02/14/2018 10:12 AM  Resp 16 02/14/2018 10:12 AM  SpO2 100 % 02/14/2018 10:12 AM  Vitals shown include unvalidated device data.  Last Pain:  Vitals:   02/14/18 1011  TempSrc: Oral         Complications: No apparent anesthesia complications

## 2018-02-14 NOTE — Anesthesia Procedure Notes (Signed)
Procedure Name: MAC Date/Time: 02/14/2018 9:41 AM Performed by: Lissa Morales, CRNA Pre-anesthesia Checklist: Patient identified, Emergency Drugs available, Suction available, Timeout performed and Patient being monitored Patient Re-evaluated:Patient Re-evaluated prior to induction Oxygen Delivery Method: Nasal cannula Placement Confirmation: positive ETCO2

## 2018-02-14 NOTE — Discharge Instructions (Signed)
Gastrointestinal Endoscopy, Care After Refer to this sheet in the next few weeks. These instructions provide you with information about caring for yourself after your procedure. Your health care provider may also give you more specific instructions. Your treatment has been planned according to current medical practices, but problems sometimes occur. Call your health care provider if you have any problems or questions after your procedure. What can I expect after the procedure? After your procedure, it is common to feel:  Bloated.  Soreness in your throat.  Sleepy.  Follow these instructions at home:  Do not drive for 24 hours if you received a if you received a medicine to help you relax (sedative).  Avoid drinking warm beverages and alcohol for the first 24 hours after the procedure.  Take over-the-counter and prescription medicines only as told by your health care provider.  Drink enough fluids to keep your urine clear or pale yellow.  If you feel bloated, try going for a walk. Walking may help the feeling go away.  If your throat is sore, try gargling with salt water. Get help right away if:  You have severe nausea or vomiting.  You have severe abdominal pain, abdominal cramps that last longer than 6 hours, or abdominal swelling.  You have severe shoulder or back pain.  You have trouble swallowing.  You have shortness of breath, your breathing is shallow, or you breathing is faster than normal.  You have a fever.  Your heart is beating very fast.  You vomit blood or material that looks like coffee grounds.  You have bloody, black, or tarry stools. This information is not intended to replace advice given to you by your health care provider. Make sure you discuss any questions you have with your health care provider. Document Released: 09/28/2003 Document Revised: 12/22/2015 Document Reviewed: 12/06/2014 Elsevier Interactive Patient Education  2018 [employer].

## 2018-02-15 ENCOUNTER — Encounter (HOSPITAL_COMMUNITY): Payer: Self-pay | Admitting: Gastroenterology

## 2018-02-15 NOTE — Addendum Note (Signed)
Addendum  created 02/15/18 2179 by Lollie Sails, CRNA   Charge Capture section accepted

## 2018-02-25 ENCOUNTER — Encounter: Payer: Self-pay | Admitting: Nurse Practitioner

## 2018-02-25 ENCOUNTER — Non-Acute Institutional Stay (SKILLED_NURSING_FACILITY): Payer: Medicare Other | Admitting: Nurse Practitioner

## 2018-02-25 DIAGNOSIS — F0280 Dementia in other diseases classified elsewhere without behavioral disturbance: Secondary | ICD-10-CM

## 2018-02-25 DIAGNOSIS — G301 Alzheimer's disease with late onset: Secondary | ICD-10-CM

## 2018-02-25 DIAGNOSIS — L03115 Cellulitis of right lower limb: Secondary | ICD-10-CM | POA: Diagnosis not present

## 2018-02-25 DIAGNOSIS — Z86718 Personal history of other venous thrombosis and embolism: Secondary | ICD-10-CM

## 2018-02-25 DIAGNOSIS — I739 Peripheral vascular disease, unspecified: Secondary | ICD-10-CM | POA: Diagnosis not present

## 2018-02-25 NOTE — Assessment & Plan Note (Signed)
Continue SNF [facility] for safety and care assistance, continue Memantine 10mg bid for memory 

## 2018-02-25 NOTE — Assessment & Plan Note (Signed)
Clinically presumed, no pedal dorsalis pulses palpated, dependent ruber presented during today's visit, will update ABI R+L to r/o PAD. Observe.

## 2018-02-25 NOTE — Assessment & Plan Note (Signed)
Continue Rivaroxaban 15mg  qd for long term.

## 2018-02-25 NOTE — Progress Notes (Signed)
Location:  [facility] Room Number: 35 Place of Service:  SNF (31) Provider:  Marlana Latus  NP  Kalea Perine X, NP  Patient Care Team: Adaeze Better X, NP as PCP - General (Internal Medicine) Annia Belt, MD as Consulting Physician (Hematology and Oncology) Suella Broad, MD as Consulting Physician (Physical Medicine and Rehabilitation) Noralee Space, MD (Pulmonary Disease) [place] Carilyn Woolston X, NP as Nurse Practitioner (Nurse Practitioner) Inda Castle, MD (Inactive) as Consulting Physician (Gastroenterology)  Extended Emergency Contact Information Primary Emergency Contact: Maclaren,Gene Address: [address]          [place], [place] [address] [place] Phone: [phone] Work Phone: [phone] Mobile Phone: [phone] Relation: Son Secondary Emergency Contact: [name], [place] [address] [place]net[place]itter of [place] Phone: [phone] Relation: Son  Code Status:  DNR Goals of care: Advanced Directive information Advanced Directives 02/14/2018  Does Patient Have a Medical Advance Directive? Yes  Type of Paramedic of [place];Living will  Does patient want to make changes to medical advance directive? -  Copy of [place] in Chart? -  Pre-existing out of facility DNR order (yellow form or pink MOST form) -     Chief Complaint  Patient presents with  . Acute Visit    C/o- (R) LE -redness, swelling, tenderness    HPI:  Pt is a 84 y.o. female seen today for an acute visit for lateral right ankle redness, scabbed over area at the lateral malleolus, mild swelling in ankle/foot, noted warmth, pain when palpated but no ROM or weight bearing. Onset and duration area uncertain, HPI was provided with assistance of staff. She resides in [facility] for safety and care assistance, on  Memantine 10mg  bid for memory. Hx of DVTs, on chronic  Rivaroxaban 15mg  qd po.    Past Medical History:  Diagnosis Date  . Abnormal weight gain 05/09/2012  . Achalasia of esophagus 09/18/2013   09/18/13 Nifedipine 10mg  tid ac meals. 11/17/14 [facility] f/u as neeed   . Anxiety and depression 03/12/2012   Increased Sertraline to 50mg  daily 07/16/13 08/26/13 Mirtazapine 7.5mg  qhs  10/12/14 Pharm decreased Sertraline to 25mg .  05/04/15 TSH 2.89 09/15/15 pharm taper off Sertraline.  10/08/15 continue Sertraline 25mg  daily, POA desires. 12/02/15 wbc 6.1, Hgb 12.0, plt 271    . Asthmatic bronchitis   . BACK PAIN, LUMBAR 03/12/2007   Hx of lower back pain, had X-ray of her lumbar spine 09/08/12--spondylosis-better controlled with Hydrocodone/ApAp  10/325mg  ac and hs  01/01/13 lumbar spine inj. Able to ambulate with walker on unit until her fall 06/22/13. 06/25/13 unremarkable. X-ray lumbar spine. 11/10/13 dc Oxycodone prn-not used in 60 days.     . COLONIC POLYPS 03/12/2007   Initially noted April 2002 colonoscopy. Followup colonoscopy 05/31/2006 did not disclose any further polyps.    Marland Kitchen COPD (chronic obstructive pulmonary disease) ([place])   . Coronary artery disease   . Deep vein thrombosis of left lower extremity ([place]) 07/19/2012  . Dementia ([place]) 09/16/2012   05/04/15 TSH 2.89   . Depression   . Diverticulosis 05/10/2012  . DJD (degenerative joint disease)   . Dry skin dermatitis 11/18/2012  . Edema of leg 07/01/2010  . Epistaxis 01/24/2016   01/21/16: x2, held Xarelto x 1 day, Saline Nasal spray prn, wbc 9.5, Hgb 12.4, Plt 331, Na 138, K 4.5, Bun 25, creat  0.89 01/22/16 [id number](99-37)  . Esophageal dysmotility 07/26/2013  . Esophageal dysphagia 05/27/2010   New dx of achalasia 6/ 2015 09/18/13 Nifedipine 10mg  tid ac meals.    . Esophageal stricture   . Essential hypertension, benign 10/30/2013   12/02/15 wbc 6.1, Hgb 12.0, plt 271   . Essential thrombocythemia ([place]) 06/09/2009  . Fall 06/25/2013  . GERD (gastroesophageal reflux disease)   . Hip fracture ([place]) 09/08/2012  .  HYPERCHOLESTEROLEMIA 03/12/2007   08/10/15 cholesterol 119, triglycerides 143, HDL 51, LDL 39   . Lumbar back pain   . MACULAR DEGENERATION 01/26/2008   Qualifier: Diagnosis of  By: Lenna Gilford MD, Deborra Medina   . Osteoarthritis 03/12/2007  . Osteoporosis 03/12/2007   Qualifier: Diagnosis of  By: Julien Girt CMA, Marliss Czar  06/15/14 dc Vit D 1000u po daily per pharm.    . Phlebitis and thrombophlebitis of other deep vessels of lower extremities 07/10/2010  . S/P balloon dilatation of esophageal stricture 07/28/2013   07/26/13. Dr. Henrene Pastor. EGD foreign removal and Balloon dilation of esophagus. Esophageal dysmotility, suspected achalasia, dilation at the GE junction to 21mm. Candida esophagitis.    . SYNCOPE 07/30/2009   Occurred 2011. Hospitalized. Felt due to dehydration.    . TRANSIENT ISCHEMIC ATTACKS, HX OF 03/12/2007   Qualifier: Diagnosis of  By: Julien Girt CMA, Marliss Czar     Past Surgical History:  Procedure Laterality Date  . ABDOMINAL HYSTERECTOMY     and bso  . APPENDECTOMY    . BOTOX INJECTION N/A 09/18/2013   Procedure: BOTOX INJECTION;  Surgeon: Lafayette Dragon, MD;  Location: [facility];  Service: Endoscopy;  Laterality: N/A;  . BOTOX INJECTION N/A 10/16/2014   Procedure: BOTOX INJECTION;  Surgeon: Inda Castle, MD;  Location: [facility];  Service: Endoscopy;  Laterality: N/A;  . BOTOX INJECTION N/A 05/18/2016   Procedure: BOTOX INJECTION;  Surgeon: Milus Banister, MD;  Location: [facility];  Service: Endoscopy;  Laterality: N/A;  . BOTOX INJECTION N/A 02/14/2018   Procedure: BOTOX INJECTION;  Surgeon: Milus Banister, MD;  Location: [facility];  Service: Endoscopy;  Laterality: N/A;  . CATARACT EXTRACTION W/ INTRAOCULAR LENS  IMPLANT, BILATERAL    . CHOLECYSTECTOMY    . ESOPHAGEAL MANOMETRY N/A 08/25/2013   Procedure: ESOPHAGEAL MANOMETRY (EM);  Surgeon: Jerene Bears, MD;  Location: [facility];  Service: Gastroenterology;  Laterality: N/A;  . ESOPHAGOGASTRODUODENOSCOPY N/A 07/26/2013   Procedure:  ESOPHAGOGASTRODUODENOSCOPY (EGD);  Surgeon: Irene Shipper, MD;  Location: [facility] END[facility]COPY;  Service: Endoscopy;  Laterality: N/A;  . ESOPHAGOGASTRODUODENOSCOPY N/A 10/16/2014   Procedure: ESOPHAGOGASTRODUODENOSCOPY (EGD);  Surgeon: Inda Castle, MD;  Location: [facility];  Service: Endoscopy;  Laterality: N/A;  with botox  . ESOPHAGOGASTRODUODENOSCOPY (EGD) WITH ESOPHAGEAL DILATION N/A 11/25/2012   Procedure: ESOPHAGOGASTRODUODENOSCOPY (EGD) WITH ESOPHAGEAL DILATION;  Surgeon: Jerene Bears, MD;  Location: [facility];  Service: Gastroenterology;  Laterality: N/A;  . ESOPHAGOGASTRODUODENOSCOPY (EGD) WITH PROPOFOL N/A 09/18/2013   Procedure: ESOPHAGOGASTRODUODENOSCOPY (EGD) WITH PROPOFOL;  Surgeon: Lafayette Dragon, MD;  [facility]: [facility];  Service: Endoscopy;  Laterality: N/A;  . ESOPHAGOGASTRODUODENOSCOPY (EGD) WITH PROPOFOL N/A 05/18/2016   Procedure: ESOPHAGOGASTRODUODENOSCOPY (EGD) WITH PROPOFOL;  Surgeon: Milus Banister, MD;  [facility]: [facility];  Service: Endoscopy;  Laterality: N/A;  . ESOPHAGOGASTRODUODENOSCOPY (EGD) WITH PROPOFOL N/A 02/14/2018   Procedure: ESOPHAGOGASTRODUODENOSCOPY (EGD) WITH PROPOFOL;  Surgeon: Milus Banister, MD;  Location: [facility];  Service: Endoscopy;  Laterality: N/A;  . HIP ARTHROPLASTY Right 09/09/2012   Procedure: ARTHROPLASTY BIPOLAR HIP;  Surgeon: Mauri Pole, MD;  Location: [facility];  Service: Orthopedics;  Laterality: Right;  . TAH and BSO  08/21/1995    Allergies  Allergen Reactions  . Sulfamethoxazole Rash    Outpatient Encounter Medications as of 02/25/2018  Medication Sig  . acetaminophen (TYLENOL) 325 MG tablet Take 650 mg by mouth every 8 (eight) hours as needed for moderate pain.   Marland Kitchen albuterol (PROVENTIL HFA;VENTOLIN HFA) 108 (90 BASE) MCG/ACT inhaler Inhale 2 puffs into the lungs every 6 (six) hours as needed for wheezing or shortness of breath.  Marland Kitchen atorvastatin (LIPITOR) 10 MG tablet Take 10 mg by mouth at bedtime.   . chlorhexidine  (PERIDEX) 0.12 % solution Use as directed 5 mLs in the mouth or throat daily. SWISH AND SPIT 5ML FOR 30 SECONDS EVERYDAY INDEFINITELY  . cholecalciferol (VITAMIN D) 1000 units tablet Take 1,000 Units by mouth daily.  . fluticasone furoate-vilanterol (BREO ELLIPTA) 100-25 MCG/INH AEPB Inhale 1 puff into the lungs daily. One puff inhale once a day, rinse mouth after each use.   . Humidifier MISC Please use humidifier to prevent excessive dryness.  Marland Kitchen HYDROcodone-acetaminophen (NORCO) 10-325 MG tablet Take 1 tablet by mouth See admin instructions. Take 1 tablet by mouth SCHEDULED twice daily; may take an additional tablet once daily as needed for chronic pain.  . hydroxyurea (HYDREA) 500 MG capsule Take 500-1,000 mg by mouth See admin instructions. Take 2 capsules (1000 mg) by mouth on Mondays ONLY. Take 1 capsules (500 mg) by mouth on Sundays, Wednesdays, & Fridays.  WEAR GLOVES WHEN HANDLING THIS MEDICATION.  . memantine (NAMENDA) 10 MG tablet Take 10 mg by mouth 2 (two) times daily.   . metoprolol tartrate (LOPRESSOR) 25 MG tablet Take 25 mg by mouth 2 (two) times daily.  . montelukast (SINGULAIR) 10 MG tablet Take 10 mg by mouth at bedtime.  . Multiple Vitamins-Minerals (PRESERVISION AREDS 2) CAPS Take 1 capsule by mouth 2 (two) times daily.  Marland Kitchen NIFEdipine (PROCARDIA) 10 MG capsule Take 10 mg by mouth 3 (three) times daily before meals. Squeeze contents of one capsule under tongue 15-30 minutes before meals 3 times a day  . nitroGLYCERIN (NITROSTAT) 0.4 MG SL tablet Place 0.4 mg under the tongue every 5 (five) minutes as needed for chest pain.  Marland Kitchen omeprazole (PRILOSEC) 20 MG capsule Take 20 mg by mouth at bedtime.   . Rivaroxaban (XARELTO) 15 MG TABS tablet Take 15 mg by mouth daily. 5pm  . sertraline (ZOLOFT) 25 MG tablet Take 25 mg by mouth daily.  . sodium chloride (OCEAN) 0.65 % nasal spray Place 1 spray into the nose 2 (two) times daily.  . sodium fluoride (DENTA 5000 PLUS) 1.1 % CREA dental  cream Place 1 application onto teeth at bedtime. Apply a thin ribbon   No facility-administered encounter medications on file as of 02/25/2018.    ROS was provided with assistance of staff Review of Systems  Constitutional: Negative for activity change, appetite change, chills, diaphoresis, fatigue and fever.  HENT: Positive for hearing loss. Negative for congestion and voice change.   Respiratory: Negative for cough, shortness of breath and wheezing.   Cardiovascular: Positive for leg swelling. Negative for chest pain and palpitations.  Gastrointestinal: Negative for abdominal distention, abdominal pain, constipation, diarrhea, nausea and vomiting.  Genitourinary: Negative for difficulty urinating, dysuria and urgency.  Musculoskeletal: Positive for arthralgias, back pain and gait problem.  Skin: Positive for color change and wound. Negative for pallor and rash.  Neurological: Negative for  dizziness, speech difficulty, weakness and headaches.       Dementia  Psychiatric/Behavioral: Negative for agitation, behavioral problems, hallucinations and sleep disturbance. The patient is not nervous/anxious.     Immunization History  Administered Date(s) Administered  . Influenza Split 11/16/2010, 12/13/2011  . Influenza Whole 11/27/2009, 11/29/2017  . Influenza-Unspecified 12/31/2013, 11/18/2014, 12/14/2015, 12/11/2016  . PPD Test 09/11/2012  . Pneumococcal Polysaccharide-23 02/28/1988  . Tdap 05/31/2017   Pertinent  Health Maintenance Due  Topic Date Due  . PNA vac Low Risk Adult (2 of 2 - PCV13) 02/27/1989  . INFLUENZA VACCINE  Completed  . DEXA SCAN  Completed   Fall Risk  02/08/2017 02/08/2017 10/04/2015 12/28/2014 10/23/2014  Falls in the past year? No Exclusion - non ambulatory No Yes Yes  Number falls in past yr: - - - 1 1  Injury with Fall? - - - No No  Risk for fall due to : - Impaired balance/gait;Impaired vision Impaired mobility;Impaired balance/gait History of fall(s);Impaired  balance/gait;Impaired mobility Impaired mobility  Follow up - - - Falls prevention discussed Falls evaluation completed   Functional Status Survey:    Vitals:   02/25/18 1002  BP: 130/60  Pulse: 76  Resp: 20  Temp: 98 F (36.7 C)  SpO2: 96%   There is no height or weight on file to calculate BMI. Physical Exam Constitutional:      General: She is not in acute distress.    Appearance: Normal appearance. She is not ill-appearing or diaphoretic.  HENT:     Head: Normocephalic and atraumatic.     Nose: Nose normal.     Mouth/Throat:     Mouth: Mucous membranes are moist.  Eyes:     Extraocular Movements: Extraocular movements intact.     Pupils: Pupils are equal, round, and reactive to light.  Neck:     Musculoskeletal: Normal range of motion and neck supple.  Cardiovascular:     Rate and Rhythm: Normal rate and regular rhythm.     Heart sounds: No murmur.     Comments: Diminished dorsalis pedis pulses R+L Pulmonary:     Effort: Pulmonary effort is normal.     Breath sounds: No wheezing, rhonchi or rales.  Abdominal:     Palpations: Abdomen is soft.     Tenderness: There is no abdominal tenderness. There is no guarding or rebound.  Musculoskeletal: Normal range of motion.        General: Swelling and tenderness present. No signs of injury.     Comments: Lateral right ankle.   Skin:    General: Skin is warm and dry.     Findings: Erythema present.     Comments: A scabbed over area at the tip of the right lateral malleolus.Miild swelling lateral right ankle and foot. Mild warmth and tenderness lateral right ankle when palpated. No pain with ROM or weight bearing.   Neurological:     General: No focal deficit present.     Mental Status: She is alert. Mental status is at baseline.     Cranial Nerves: No cranial nerve deficit.     Motor: No weakness.     Coordination: Coordination normal.     Gait: Gait abnormal.     Comments: Oriented to person and place.   Psychiatric:         Mood and Affect: Mood normal.        Behavior: Behavior normal.     Labs reviewed: Recent Labs    09/18/17 11/21/17  NA 142 145  K 3.8 4.0  CL 104 111  CO2 30 29  BUN 21 17  CREATININE 0.8 0.7  CALCIUM 9.4 8.5  MG 1.9  --    Recent Labs    09/18/17 11/21/17  AST 19 12*  ALT 13 10  ALKPHOS 71 66  PROT 6.8 5.1  ALBUMIN 4.2 3.2   Recent Labs    07/03/17  10/29/17 11/21/17 01/01/18  WBC 6.9   < > 6.3 5.7 5.6  NEUTROABS 4,264  --   --   --   --   HGB 12.3   < > 12.3 11.2* 11.6*  HCT 36   < > 36 33* 34*  PLT 269   < > 264 333 292   < > = values in this interval not displayed.   Lab Results  Component Value Date   TSH 2.00 09/21/2016   Lab Results  Component Value Date   HGBA1C 5.5 10/16/2014   Lab Results  Component Value Date   CHOL 143 09/18/2017   HDL 50 09/18/2017   LDLCALC 70 09/18/2017   LDLDIRECT 152.3 06/04/2009   TRIG 147 09/18/2017   CHOLHDL 3 09/13/2011    Significant Diagnostic Results in last 30 days:  No results found.  Assessment/Plan Cellulitis of right ankle Will treat with Doxycycline 100mg  bid x 7 days, FloraStor bid x 7 days. Observe.   PAD (peripheral artery disease) (HCC) Clinically presumed, no pedal dorsalis pulses palpated, dependent ruber presented during today's visit, will update ABI R+L to r/o PAD. Observe.   Alzheimer's dementia without behavioral disturbance Continue SNF [facility] for safety and care assistance, continue Memantine 10mg  bid for memory.   History of DVT (deep vein thrombosis) Continue Rivaroxaban 15mg  qd for long term.      Family/ staff Communication: plan of care reviewed with the patient and charge nurse.   Labs/tests ordered:  ABI R+L  Time spend 25 minutes.

## 2018-02-25 NOTE — Assessment & Plan Note (Signed)
Will treat with Doxycycline 100mg  bid x 7 days, FloraStor bid x 7 days. Observe.

## 2018-02-26 DIAGNOSIS — R52 Pain, unspecified: Secondary | ICD-10-CM | POA: Diagnosis not present

## 2018-03-01 ENCOUNTER — Encounter: Payer: Self-pay | Admitting: Nurse Practitioner

## 2018-03-01 ENCOUNTER — Non-Acute Institutional Stay (SKILLED_NURSING_FACILITY): Payer: Medicare Other | Admitting: Nurse Practitioner

## 2018-03-01 DIAGNOSIS — F329 Major depressive disorder, single episode, unspecified: Secondary | ICD-10-CM

## 2018-03-01 DIAGNOSIS — I82402 Acute embolism and thrombosis of unspecified deep veins of left lower extremity: Secondary | ICD-10-CM | POA: Diagnosis not present

## 2018-03-01 DIAGNOSIS — Z7901 Long term (current) use of anticoagulants: Secondary | ICD-10-CM

## 2018-03-01 DIAGNOSIS — I739 Peripheral vascular disease, unspecified: Secondary | ICD-10-CM | POA: Diagnosis not present

## 2018-03-01 DIAGNOSIS — F419 Anxiety disorder, unspecified: Secondary | ICD-10-CM

## 2018-03-01 DIAGNOSIS — J449 Chronic obstructive pulmonary disease, unspecified: Secondary | ICD-10-CM | POA: Diagnosis not present

## 2018-03-01 DIAGNOSIS — I119 Hypertensive heart disease without heart failure: Secondary | ICD-10-CM

## 2018-03-01 DIAGNOSIS — F32A Depression, unspecified: Secondary | ICD-10-CM

## 2018-03-01 DIAGNOSIS — L03115 Cellulitis of right lower limb: Secondary | ICD-10-CM

## 2018-03-01 DIAGNOSIS — F0280 Dementia in other diseases classified elsewhere without behavioral disturbance: Secondary | ICD-10-CM

## 2018-03-01 DIAGNOSIS — G301 Alzheimer's disease with late onset: Secondary | ICD-10-CM

## 2018-03-01 DIAGNOSIS — J4489 Other specified chronic obstructive pulmonary disease: Secondary | ICD-10-CM

## 2018-03-01 DIAGNOSIS — M5441 Lumbago with sciatica, right side: Secondary | ICD-10-CM

## 2018-03-01 DIAGNOSIS — D473 Essential (hemorrhagic) thrombocythemia: Secondary | ICD-10-CM

## 2018-03-01 DIAGNOSIS — K219 Gastro-esophageal reflux disease without esophagitis: Secondary | ICD-10-CM

## 2018-03-01 DIAGNOSIS — M5440 Lumbago with sciatica, unspecified side: Secondary | ICD-10-CM

## 2018-03-01 DIAGNOSIS — G8929 Other chronic pain: Secondary | ICD-10-CM

## 2018-03-01 NOTE — Assessment & Plan Note (Signed)
No significant improvement, PAD hinders healing, observe.

## 2018-03-01 NOTE — Assessment & Plan Note (Signed)
Continue SNF [facility] for care assistance, continue Memantine 10mg  bid for memory.

## 2018-03-01 NOTE — Assessment & Plan Note (Signed)
Continue hydroxyurea, continue f/u oncology.

## 2018-03-01 NOTE — Assessment & Plan Note (Signed)
Blood pressure is controlled, continue Metoprolol 25mg bid.  

## 2018-03-01 NOTE — Assessment & Plan Note (Signed)
Stable, continue f/u GI prn, continue Omeprazole 20mg  qd, Nefidipine 10mg  tid.

## 2018-03-01 NOTE — Assessment & Plan Note (Signed)
Continue Norco 10/325mg  bid and daily prn. W/c for mobility

## 2018-03-01 NOTE — Assessment & Plan Note (Signed)
Her mood is stable, continue Sertraline 25mg qd.  

## 2018-03-01 NOTE — Progress Notes (Signed)
Location:  [facility] Room Number: 35 Place of Service:  SNF (31) Provider:  Marlana Latus  NP  Delphine Sizemore X, NP  Patient Care Team: Latoi Giraldo X, NP as PCP - General (Internal Medicine) Annia Belt, MD as Consulting Physician (Hematology and Oncology) Suella Broad, MD as Consulting Physician (Physical Medicine and Rehabilitation) Noralee Space, MD (Pulmonary Disease) [place] Calyssa Zobrist X, NP as Nurse Practitioner (Nurse Practitioner) Inda Castle, MD (Inactive) as Consulting Physician (Gastroenterology)  Extended Emergency Contact Information Primary Emergency Contact: Blowe,Gene Address: [address]          [place], [place] [address] [place] Phone: [phone] Work Phone: [phone] Mobile Phone: [phone] Relation: Son Secondary Emergency Contact: [name], [place] [address] Johnnette Litter of [place] Phone: [phone] Relation: Son  Code Status:  DNR Goals of care: Advanced Directive information Advanced Directives 03/01/2018  Does Patient Have a Medical Advance Directive? Yes  Type of Paramedic of [place];Living will;Out of facility DNR (pink MOST or yellow form)  Does patient want to make changes to medical advance directive? No - Patient declined  Copy of [place] in Chart? Yes - validated most recent copy scanned in chart (See row information)  Pre-existing out of facility DNR order (yellow form or pink MOST form) Yellow form placed in chart (order not valid for inpatient use)     Chief Complaint  Patient presents with  . Medical Management of Chronic Issues    HPI:  Pt is a 85 y.o. female seen today for medical management of chronic diseases.     The patient has PAD, 02/26/18 ABI R 0.58, L 0.44-moderate arterial disease in the right lower extremity, severe arterial disease in the left lower extremity. Her  mood is stable on Sertraline 25mg  qd. Hx of DVTs, on chronic Rivaroxaban 15mg  qd. GERD stable, on Omeprazole 20mg  qd. Nifedipine 10mg  tid. Asthma, stable on Montelukast 10mg  qd, Breo Ellipta daily, prn Albuterol HFA q6h prn.  HTN, blood pressure is controlled on Metoprolol 25mg  bid. [facility] for [facility] and care assistance, on Memantine 10mg  bid for memory. Chronic lower back pain is managed on Norco 10/325mg  bid and daily prn.   Past Medical History:  Diagnosis Date  . Abnormal weight gain 05/09/2012  . Achalasia of esophagus 09/18/2013   09/18/13 Nifedipine 10mg  tid ac meals. 11/17/14 [facility] f/u as neeed   . Anxiety and depression 03/12/2012   Increased Sertraline to 50mg  daily 07/16/13 08/26/13 Mirtazapine 7.5mg  qhs  10/12/14 Pharm decreased Sertraline to 25mg .  05/04/15 TSH 2.89 09/15/15 pharm taper off Sertraline.  10/08/15 continue Sertraline 25mg  daily, POA desires. 12/02/15 wbc 6.1, Hgb 12.0, plt 271    . Asthmatic bronchitis   . BACK PAIN, LUMBAR 03/12/2007   Hx of lower back pain, had X-ray of her lumbar spine 09/08/12--spondylosis-better controlled with Hydrocodone/ApAp  10/325mg  ac and hs  01/01/13 lumbar spine inj. Able to ambulate with walker on unit until her fall 06/22/13. 06/25/13 unremarkable. X-ray lumbar spine. 11/10/13 dc Oxycodone prn-not used in 60 days.     . COLONIC POLYPS 03/12/2007   Initially noted April 2002 colonoscopy. Followup colonoscopy 05/31/2006 did not disclose any further polyps.    Marland Kitchen COPD (chronic obstructive pulmonary disease) ([place])   . Coronary artery disease   . Deep vein thrombosis of left lower extremity ([place])  07/19/2012  . Dementia ([place]) 09/16/2012   05/04/15 TSH 2.89   . Depression   . Diverticulosis 05/10/2012  . DJD (degenerative joint disease)   . Dry skin dermatitis 11/18/2012  . Edema of leg 07/01/2010  . Epistaxis 01/24/2016   01/21/16: x2, held Xarelto x 1 day, Saline Nasal spray prn, wbc 9.5, Hgb 12.4, Plt 331, Na 138, K 4.5, Bun 25, creat 0.89  01/22/16 VVO16(07-37)  . Esophageal dysmotility 07/26/2013  . Esophageal dysphagia 05/27/2010   New dx of achalasia 6/ 2015 09/18/13 Nifedipine 10mg  tid ac meals.    . Esophageal stricture   . Essential hypertension, benign 10/30/2013   12/02/15 wbc 6.1, Hgb 12.0, plt 271   . Essential thrombocythemia ([place]) 06/09/2009  . Fall 06/25/2013  . GERD (gastroesophageal reflux disease)   . Hip fracture ([place]) 09/08/2012  . HYPERCHOLESTEROLEMIA 03/12/2007   08/10/15 cholesterol 119, triglycerides 143, HDL 51, LDL 39   . Lumbar back pain   . MACULAR DEGENERATION 01/26/2008   Qualifier: Diagnosis of  By: Lenna Gilford MD, Deborra Medina   . Osteoarthritis 03/12/2007  . Osteoporosis 03/12/2007   Qualifier: Diagnosis of  By: Julien Girt CMA, Marliss Czar  06/15/14 dc Vit D 1000u po daily per pharm.    . Phlebitis and thrombophlebitis of other deep vessels of lower extremities 07/10/2010  . S/P balloon dilatation of esophageal stricture 07/28/2013   07/26/13. Dr. Henrene Pastor. EGD foreign removal and Balloon dilation of esophagus. Esophageal dysmotility, suspected achalasia, dilation at the GE junction to 38mm. Candida esophagitis.    . SYNCOPE 07/30/2009   Occurred 2011. Hospitalized. Felt due to dehydration.    . TRANSIENT ISCHEMIC ATTACKS, HX OF 03/12/2007   Qualifier: Diagnosis of  By: Julien Girt CMA, Marliss Czar     Past Surgical History:  Procedure Laterality Date  . ABDOMINAL HYSTERECTOMY     and bso  . APPENDECTOMY    . BOTOX INJECTION N/A 09/18/2013   Procedure: BOTOX INJECTION;  Surgeon: Lafayette Dragon, MD;  [facility]: [facility];  Service: Endoscopy;  Laterality: N/A;  . BOTOX INJECTION N/A 10/16/2014   Procedure: BOTOX INJECTION;  Surgeon: Inda Castle, MD;  [facility];  Service: Endoscopy;  Laterality: N/A;  . BOTOX INJECTION N/A 05/18/2016   Procedure: BOTOX INJECTION;  Surgeon: Milus Banister, MD;  [facility];  Service: Endoscopy;  Laterality: N/A;  . BOTOX INJECTION N/A 02/14/2018   Procedure: BOTOX INJECTION;   Surgeon: Milus Banister, MD;  [facility];  Service: Endoscopy;  Laterality: N/A;  . CATARACT EXTRACTION W/ INTRAOCULAR LENS  IMPLANT, BILATERAL    . CHOLECYSTECTOMY    . ESOPHAGEAL MANOMETRY N/A 08/25/2013   Procedure: ESOPHAGEAL MANOMETRY (EM);  Surgeon: Jerene Bears, MD;  [facility];  Service: Gastroenterology;  Laterality: N/A;  . ESOPHAGOGASTRODUODENOSCOPY N/A 07/26/2013   Procedure: ESOPHAGOGASTRODUODENOSCOPY (EGD);  Surgeon: Irene Shipper, MD;  Location: [facility] ENDOSCOPY;  Service: Endoscopy;  Laterality: N/A;  . ESOPHAGOGASTRODUODENOSCOPY N/A 10/16/2014   Procedure: ESOPHAGOGASTRODUODENOSCOPY (EGD);  Surgeon: Inda Castle, MD;  Location: [facility];  Service: Endoscopy;  Laterality: N/A;  with botox  . ESOPHAGOGASTRODUODENOSCOPY (EGD) WITH ESOPHAGEAL DILATION N/A 11/25/2012   Procedure: ESOPHAGOGASTRODUODENOSCOPY (EGD) WITH ESOPHAGEAL DILATION;  Surgeon: Jerene Bears, MD;  [facility];  Service: Gastroenterology;  Laterality: N/A;  . ESOPHAGOGASTRODUODENOSCOPY (EGD) WITH PROPOFOL N/A 09/18/2013   Procedure: ESOPHAGOGASTRODUODENOSCOPY (EGD) WITH PROPOFOL;  Surgeon: Lafayette Dragon, MD;  [facility];  Service: Endoscopy;  Laterality: N/A;  . ESOPHAGOGASTRODUODENOSCOPY (EGD) WITH PROPOFOL  N/A 05/18/2016   Procedure: ESOPHAGOGASTRODUODENOSCOPY (EGD) WITH PROPOFOL;  Surgeon: Milus Banister, MD;  [facility];  Service: Endoscopy;  Laterality: N/A;  . ESOPHAGOGASTRODUODENOSCOPY (EGD) WITH PROPOFOL N/A 02/14/2018   Procedure: ESOPHAGOGASTRODUODENOSCOPY (EGD) WITH PROPOFOL;  Surgeon: Milus Banister, MD;  [facility];  Service: Endoscopy;  Laterality: N/A;  . HIP ARTHROPLASTY Right 09/09/2012   Procedure: ARTHROPLASTY BIPOLAR HIP;  Surgeon: Mauri Pole, MD;  [facility]: [facility];  Service: Orthopedics;  Laterality: Right;  . TAH and BSO  08/21/1995    Allergies  Allergen Reactions  . Sulfamethoxazole Rash    Outpatient Encounter  Medications as of 03/01/2018  Medication Sig  . acetaminophen (TYLENOL) 325 MG tablet Take 650 mg by mouth every 8 (eight) hours as needed for moderate pain.   Marland Kitchen albuterol (PROVENTIL HFA;VENTOLIN HFA) 108 (90 BASE) MCG/ACT inhaler Inhale 2 puffs into the lungs every 6 (six) hours as needed for wheezing or shortness of breath.  Marland Kitchen atorvastatin (LIPITOR) 10 MG tablet Take 10 mg by mouth at bedtime.   . chlorhexidine (PERIDEX) 0.12 % solution Use as directed 5 mLs in the mouth or throat daily. SWISH AND SPIT 5ML FOR 30 SECONDS EVERYDAY INDEFINITELY  . cholecalciferol (VITAMIN D) 1000 units tablet Take 1,000 Units by mouth daily.  Marland Kitchen doxycycline (DORYX) 100 MG EC tablet Take 100 mg by mouth 2 (two) times daily.  . fluticasone furoate-vilanterol (BREO ELLIPTA) 100-25 MCG/INH AEPB Inhale 1 puff into the lungs daily. One puff inhale once a day, rinse mouth after each use.   . Humidifier MISC Please use humidifier to prevent excessive dryness.  Marland Kitchen HYDROcodone-acetaminophen (NORCO) 10-325 MG tablet Take 1 tablet by mouth See admin instructions. Take 1 tablet by mouth SCHEDULED twice daily; may take an additional tablet once daily as needed for chronic pain.  . hydroxyurea (HYDREA) 500 MG capsule Take 500 mg by mouth See admin instructions. Take 1 capsules (500 mg) by mouth on Sundays, Wednesdays, & Fridays. Take 2 capsules (1000 mg) by mouth on Monday only.  WEAR GLOVES WHEN HANDLING THIS MEDICATION.  . memantine (NAMENDA) 10 MG tablet Take 10 mg by mouth 2 (two) times daily.   . metoprolol tartrate (LOPRESSOR) 25 MG tablet Take 25 mg by mouth 2 (two) times daily.  . montelukast (SINGULAIR) 10 MG tablet Take 10 mg by mouth at bedtime.  . Multiple Vitamins-Minerals (PRESERVISION AREDS 2) CAPS Take 1 capsule by mouth 2 (two) times daily.  Marland Kitchen NIFEdipine (PROCARDIA) 10 MG capsule Take 10 mg by mouth 3 (three) times daily before meals. Squeeze contents of one capsule under tongue 15-30 minutes before meals 3 times a  day  . nitroGLYCERIN (NITROSTAT) 0.4 MG SL tablet Place 0.4 mg under the tongue every 5 (five) minutes as needed for chest pain.  Marland Kitchen omeprazole (PRILOSEC) 20 MG capsule Take 20 mg by mouth at bedtime.   . Rivaroxaban (XARELTO) 15 MG TABS tablet Take 15 mg by mouth daily. 5pm  . saccharomyces boulardii (FLORASTOR) 250 MG capsule Take 250 mg by mouth 2 (two) times daily.  . sertraline (ZOLOFT) 25 MG tablet Take 25 mg by mouth daily.  . sodium chloride (OCEAN) 0.65 % nasal spray Place 1 spray into the nose 2 (two) times daily.  . sodium fluoride (DENTA 5000 PLUS) 1.1 % CREA dental cream Place 1 application onto teeth at bedtime. Apply a thin ribbon   No facility-administered encounter medications on file as of 03/01/2018.    ROS was provided with assistance  of staff Review of Systems  Constitutional: Negative for activity change, appetite change, chills, diaphoresis, fatigue, fever and unexpected weight change.  HENT: Positive for hearing loss and trouble swallowing. Negative for congestion and voice change.   Respiratory: Negative for cough, shortness of breath and wheezing.   Cardiovascular: Positive for leg swelling. Negative for chest pain and palpitations.  Gastrointestinal: Negative for abdominal distention, abdominal pain, constipation, diarrhea, nausea and vomiting.  Genitourinary: Negative for difficulty urinating, dysuria and urgency.  Musculoskeletal: Positive for arthralgias, back pain and gait problem.  Skin: Positive for color change and wound.       Lateral right ankle erythema and scabbed over in the center.   Neurological: Negative for dizziness, speech difficulty, weakness and headaches.       Dementia  Psychiatric/Behavioral: Negative for agitation, behavioral problems, hallucinations and sleep disturbance. The patient is not nervous/anxious.     Immunization History  Administered Date(s) Administered  . Influenza Split 11/16/2010, 12/13/2011  . Influenza Whole 11/27/2009,  11/29/2017  . Influenza-Unspecified 12/31/2013, 11/18/2014, 12/14/2015, 12/11/2016  . PPD Test 09/11/2012  . Pneumococcal Polysaccharide-23 02/28/1988  . Tdap 05/31/2017   Pertinent  Health Maintenance Due  Topic Date Due  . PNA vac Low Risk Adult (2 of 2 - PCV13) 02/27/1989  . INFLUENZA VACCINE  Completed  . DEXA SCAN  Completed   Fall Risk  02/08/2017 02/08/2017 10/04/2015 12/28/2014 10/23/2014  Falls in the past year? No Exclusion - non ambulatory No Yes Yes  Number falls in past yr: - - - 1 1  Injury with Fall? - - - No No  Risk for fall due to : - Impaired balance/gait;Impaired vision Impaired mobility;Impaired balance/gait History of fall(s);Impaired balance/gait;Impaired mobility Impaired mobility  Follow up - - - Falls prevention discussed Falls evaluation completed   Functional Status Survey:    Vitals:   03/01/18 1110  BP: 128/70  Pulse: 76  Resp: 18  Temp: (!) 96.7 F (35.9 C)  SpO2: 95%  Weight: 98 lb 12.8 oz (44.8 kg)  Height: 5\' 1"  (1.549 m)   Body mass index is 18.67 kg/m. Physical Exam Constitutional:      General: She is not in acute distress.    Appearance: Normal appearance. She is normal weight. She is not ill-appearing, toxic-appearing or diaphoretic.  HENT:     Head: Normocephalic and atraumatic.     Nose: Nose normal.     Mouth/Throat:     Mouth: Mucous membranes are moist.  Eyes:     Extraocular Movements: Extraocular movements intact.     Pupils: Pupils are equal, round, and reactive to light.  Neck:     Musculoskeletal: Normal range of motion and neck supple.  Cardiovascular:     Rate and Rhythm: Normal rate and regular rhythm.     Heart sounds: No murmur.  Pulmonary:     Effort: Pulmonary effort is normal.     Breath sounds: No wheezing, rhonchi or rales.     Comments: Decreased air entry Abdominal:     Palpations: Abdomen is soft.     Tenderness: There is no abdominal tenderness. There is no guarding or rebound.  Skin:    General:  Skin is warm and dry.     Findings: Erythema present.     Comments: Lateral right ankle/malleolus, scabbed over center area, mild warmth and tenderness when palpated.   Neurological:     General: No focal deficit present.     Mental Status: She is alert. Mental status is  at baseline.     Cranial Nerves: No cranial nerve deficit.     Motor: No weakness.     Coordination: Coordination normal.     Gait: Gait abnormal.     Comments: Oriented to person and place.   Psychiatric:        Mood and Affect: Mood normal.        Behavior: Behavior normal.     Labs reviewed: Recent Labs    09/18/17 11/21/17  NA 142 145  K 3.8 4.0  CL 104 111  CO2 30 29  BUN 21 17  CREATININE 0.8 0.7  CALCIUM 9.4 8.5  MG 1.9  --    Recent Labs    09/18/17 11/21/17  AST 19 12*  ALT 13 10  ALKPHOS 71 66  PROT 6.8 5.1  ALBUMIN 4.2 3.2   Recent Labs    07/03/17  10/29/17 11/21/17 01/01/18  WBC 6.9   < > 6.3 5.7 5.6  NEUTROABS 4,264  --   --   --   --   HGB 12.3   < > 12.3 11.2* 11.6*  HCT 36   < > 36 33* 34*  PLT 269   < > 264 333 292   < > = values in this interval not displayed.   Lab Results  Component Value Date   TSH 2.00 09/21/2016   Lab Results  Component Value Date   HGBA1C 5.5 10/16/2014   Lab Results  Component Value Date   CHOL 143 09/18/2017   HDL 50 09/18/2017   LDLCALC 70 09/18/2017   LDLDIRECT 152.3 06/04/2009   TRIG 147 09/18/2017   CHOLHDL 3 09/13/2011    Significant Diagnostic Results in last 30 days:  No results found.  Assessment/Plan PAD (peripheral artery disease) ([place]) 02/26/18 ABI R 0.58, L 0.44-moderate arterial disease in the right lower extremity, severe arterial disease in the left lower extremity.  May consider vascular specialist for evaluation if POA desires. Apply BioFreeze qid to the lateral R malleolus x 4 weeks.   Hypertensive heart disease Blood pressure is controlled, continue Metoprolol 25mg  bid.   Deep vein thrombosis of left lower  extremity Chronic anticoagulation therapy, continue Xarelto 15mg  qd.   COPD with asthma ([place]) Stable, continue  Montelukast 10mg  qd, Breo Ellipta daily, prn Albuterol HFA q6h prn  GERD Stable, continue f/u GI prn, continue Omeprazole 20mg  qd, Nefidipine 10mg  tid.   Alzheimer's dementia without behavioral disturbance Continue SNF [facility] for care assistance, continue Memantine 10mg  bid for memory.   Essential thrombocythemia ([place]) Continue hydroxyurea, continue f/u oncology.   Long term current use of anticoagulant therapy Continue Xarelto 15mg  qd.   BACK PAIN, LUMBAR Continue Norco 10/325mg  bid and daily prn. W/c for mobility  Anxiety and depression Her mood is stable, continue Sertraline 25mg  qd.   Cellulitis of right ankle No significant improvement, PAD hinders healing, observe.      Family/ staff Communication: plan of care reviewed with the patient and charge nurse.   Labs/tests ordered:  None  Time spend 25 minutes.

## 2018-03-01 NOTE — Assessment & Plan Note (Signed)
Stable, continue  Montelukast 10mg  qd, Breo Ellipta daily, prn Albuterol HFA q6h prn

## 2018-03-01 NOTE — Assessment & Plan Note (Signed)
Chronic anticoagulation therapy, continue Xarelto 15mg qd.  

## 2018-03-01 NOTE — Assessment & Plan Note (Signed)
Continue Xarelto 15mg qd.  

## 2018-03-01 NOTE — Assessment & Plan Note (Signed)
02/26/18 ABI R 0.58, L 0.44-moderate arterial disease in the right lower extremity, severe arterial disease in the left lower extremity.  May consider vascular specialist for evaluation if POA desires. Apply BioFreeze qid to the lateral R malleolus x 4 weeks.

## 2018-03-05 ENCOUNTER — Encounter: Payer: Self-pay | Admitting: Nurse Practitioner

## 2018-03-05 NOTE — Progress Notes (Signed)
error 

## 2018-03-07 DIAGNOSIS — E7800 Pure hypercholesterolemia, unspecified: Secondary | ICD-10-CM | POA: Diagnosis not present

## 2018-03-08 ENCOUNTER — Telehealth: Payer: Self-pay | Admitting: Hematology

## 2018-03-08 ENCOUNTER — Encounter: Payer: Self-pay | Admitting: Cardiovascular Disease

## 2018-03-08 ENCOUNTER — Encounter: Payer: Self-pay | Admitting: Hematology

## 2018-03-08 ENCOUNTER — Telehealth: Payer: Self-pay | Admitting: *Deleted

## 2018-03-08 ENCOUNTER — Encounter (INDEPENDENT_AMBULATORY_CARE_PROVIDER_SITE_OTHER): Payer: Self-pay

## 2018-03-08 ENCOUNTER — Ambulatory Visit (INDEPENDENT_AMBULATORY_CARE_PROVIDER_SITE_OTHER): Payer: Medicare Other | Admitting: Cardiovascular Disease

## 2018-03-08 VITALS — BP 122/56 | HR 64

## 2018-03-08 DIAGNOSIS — I82502 Chronic embolism and thrombosis of unspecified deep veins of left lower extremity: Secondary | ICD-10-CM | POA: Diagnosis not present

## 2018-03-08 DIAGNOSIS — I10 Essential (primary) hypertension: Secondary | ICD-10-CM | POA: Diagnosis not present

## 2018-03-08 DIAGNOSIS — E7800 Pure hypercholesterolemia, unspecified: Secondary | ICD-10-CM

## 2018-03-08 DIAGNOSIS — I739 Peripheral vascular disease, unspecified: Secondary | ICD-10-CM

## 2018-03-08 NOTE — Telephone Encounter (Signed)
-----   Message from Annia Belt, MD sent at 03/08/2018 10:56 AM EST ----- Regarding: RE: labs OK to call pt/facility: labs stable; continue current dose of hydrea. She is a priority referral pt to Cancer center for follow up. Please check w [facility] & see where we stand on getting her an appt there. ----- Message ----- From: Ebbie Latus, RN Sent: 03/08/2018  10:25 AM EST To: Annia Belt, MD Subject: labs                                           Dr Gerrit Friends from [facility] - labs WBC-6.2, Hgb-11.2,  Hct-33.2,  Plt-296. Collected on 03/07/18.Thanks [employer]

## 2018-03-08 NOTE — Telephone Encounter (Signed)
[place] - talked to Sherri Crawford,who stated she had faxed lab results. Told her we had received the results; Dr Beryle Beams awared and stated continue current dose of Hydrea. Voiced understanding. Lab result will be scanned into pt's chart.

## 2018-03-08 NOTE — Assessment & Plan Note (Signed)
History of remote DVT on Xarelto.

## 2018-03-08 NOTE — Assessment & Plan Note (Signed)
History of essential hypertension with blood pressure measured today 122/56.  She is on metoprolol and Procardia.  Continue current meds at current dosing.

## 2018-03-08 NOTE — Assessment & Plan Note (Signed)
History of hyperlipidemia on statin therapy with lipid profile performed 09/18/2017 revealing to cholesterol 143, LDL 70 and HDL 50

## 2018-03-08 NOTE — Telephone Encounter (Signed)
Great.  Thanks

## 2018-03-08 NOTE — Progress Notes (Signed)
03/08/2018 [name]   May 09, 1920  [id number]  Primary Physician Virgie Dad, MD Primary Cardiologist: Lorretta Harp MD FACP, [place], [place], [place]  HPI:  Sherri Crawford is a 85 y.o. thin and frail appearing widowed Caucasian female mother of 3 children, grandmother of 4 grandchildren is accompanied by 1 of her sons Sherri Crawford today.  Referred by Dr. Lyndel Safe for peripheral vascular valuation because of a small ulcer on the lateral aspect of her right malleolus with diminished ABIs.  Her cardiac risk factors include treated hypertension hyperlipidemia.  She is never had a heart attack or stroke.  She has had a remote left lower extremity DVT on Xarelto.  [facility].  She is been wheelchair-bound for 3 years because of weakness.  She denies chest pain or shortness of breath or claudication.  She has a small ulcer on the lateral aspect of the right malleolus with recent ABIs that revealed severe PAD.   No outpatient medications have been marked as taking for the 03/08/18 encounter (Office Visit) with Lorretta Harp, MD.     Allergies  Allergen Reactions  . Sulfamethoxazole Rash    Social History   Socioeconomic History  . Marital status: Widowed    Spouse name: Sherri Crawford  . Number of children: 3  . Years of education: Not on file  . Highest education level: Not on file  Occupational History  . 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 Financial resource strain: Not hard at all  . Food insecurity:    Worry: Patient refused    Inability: Patient refused  . Transportation needs:    Medical: Patient refused    Non-medical: Patient refused  Tobacco Use  . Smoking status: Never Smoker  . Smokeless tobacco: Never Used  Substance and Sexual Activity  . Alcohol use: No    Alcohol/week: 0.0 standard drinks  . Drug use: No  . Sexual activity: Never  Lifestyle  . Physical activity:    Days per week: Patient refused    Minutes per session: Patient refused  . Stress: Not at all  Relationships  . Social connections:    Talks on phone: Never    Gets together: Patient refused    Attends religious service: More than 4 times per year    Active member of club or organization: Yes    Attends meetings of clubs or organizations: Patient refused    Relationship status: Widowed  . Intimate partner violence:    Fear of current or ex partner: Patient refused    Emotionally abused: Patient refused    Physically abused: Patient refused    Forced sexual activity: Patient refused  Other Topics Concern  . Not on file  Social History Narrative   Lives at [facility] since 09/11/12   Widowed   Never Smoked   Alcohol none   POA, DNR, Living Will         Review of Systems: General: negative for chills, fever, night sweats or weight changes.  Cardiovascular: negative for chest pain, dyspnea on exertion, edema, orthopnea, palpitations, paroxysmal nocturnal dyspnea or shortness of breath Dermatological: negative for rash Respiratory: negative for cough or wheezing Urologic: negative for hematuria Abdominal: negative for nausea, vomiting, diarrhea, bright red blood per rectum, melena, or hematemesis Neurologic: negative for visual changes, syncope, or dizziness All other systems reviewed and are otherwise negative  except as noted above.    Blood pressure (!) 122/56, pulse 64.  General appearance: alert and no distress Neck: no adenopathy, no carotid bruit, no JVD, supple, symmetrical, trachea midline and thyroid not enlarged, symmetric, no tenderness/mass/nodules Lungs: clear to auscultation bilaterally Heart: regular rate and rhythm, S1, S2 normal, no murmur, click, rub or gallop Extremities: extremities normal, atraumatic, no cyanosis or edema Pulses: 2+ and symmetric Skin: Skin color, texture, turgor normal. No rashes or lesions Neurologic: Alert and oriented X 3,  normal strength and tone. Normal symmetric reflexes. Normal coordination and gait  EKG sinus rhythm at 64 with poor R wave progression.  I personally reviewed this EKG.  ASSESSMENT AND PLAN:   PAD (peripheral artery disease) ([place]) [name] was referred by Dr. Lyndel Safe for evaluation of PAD.  She apparently had ABIs performed 02/26/2018 revealing a right ABI of 0.58 and a left of 0.44.  She has a small ulcer on the lateral aspect of her right malleolus.  She is wheelchair bound and does not ambulate.  She denies pain in her legs.  She is moderately demented.  At this point, I do not feel compelled to perform any further testing.  Essential hypertension, benign History of essential hypertension with blood pressure measured today 122/56.  She is on metoprolol and Procardia.  Continue current meds at current dosing.  HYPERCHOLESTEROLEMIA History of hyperlipidemia on statin therapy with lipid profile performed 09/18/2017 revealing to cholesterol 143, LDL 70 and HDL 50  Deep vein thrombosis of left lower extremity ([facility]) History of remote DVT on Xarelto.      Lorretta Harp MD FACP,FACC,FAHA, [facility] 03/08/2018 10:34 AM

## 2018-03-08 NOTE — Telephone Encounter (Signed)
Received a new referral from Dr. Beryle Beams for thrombocythemia. I cld and spoke to the pt's son, Sherri Crawford, to schedule an appt. Sherri Crawford has been scheduled to see Dr. Irene Limbo on 2/6 at 1pm. [name] has been made aware his mom should arrive 30 minutes early to be checked in on time. Voiced understanding. Letter mailed.

## 2018-03-08 NOTE — Assessment & Plan Note (Signed)
Ms. Bento was referred by Dr. Lyndel Safe for evaluation of PAD.  She apparently had ABIs performed 02/26/2018 revealing a right ABI of 0.58 and a left of 0.44.  She has a small ulcer on the lateral aspect of her right malleolus.  She is wheelchair bound and does not ambulate.  She denies pain in her legs.  She is moderately demented.  At this point, I do not feel compelled to perform any further testing.

## 2018-03-08 NOTE — Telephone Encounter (Signed)
Sherri Crawford called the cancer ctr - appt has been schedule w/Dr Irene Limbo on 04/04/18. Per Epic note, pt's son aware.

## 2018-03-08 NOTE — Patient Instructions (Signed)
Medication Instructions:  NONE If you need a refill on your cardiac medications before your next appointment, please call your pharmacy.   Lab work: NONE If you have labs (blood work) drawn today and your tests are completely normal, you will receive your results only by: Marland Kitchen MyChart Message (if you have MyChart) OR . A paper copy in the mail If you have any lab test that is abnormal or we need to change your treatment, we will call you to review the results.  Testing/Procedures: NONE  Follow-Up: At [facility], you and your health needs are our priority.  As part of our continuing mission to provide you with exceptional heart care, we have created designated Provider Care Teams.  These Care Teams include your primary Cardiologist (physician) and Advanced Practice Providers (APPs -  Physician Assistants and Nurse Practitioners) who all work together to provide you with the care you need, when you need it. . You MAY SCHEDULE a follow up appointment AS NEEDED.  Please call our office 2 months in advance to schedule this appointment.  You may see Dr. Gwenlyn Found or one of the following Advanced Practice Providers on your designated Care Team:   . Kerin Ransom, [place] . Almyra Deforest, PA-C . Fabian Sharp, PA-C . Jory Sims, DNP . Rosaria Ferries, PA-C . Roby Lofts, PA-C . Sande Rives, PA-C

## 2018-03-27 ENCOUNTER — Other Ambulatory Visit: Payer: Self-pay | Admitting: *Deleted

## 2018-03-27 DIAGNOSIS — M5440 Lumbago with sciatica, unspecified side: Secondary | ICD-10-CM

## 2018-03-27 MED ORDER — HYDROCODONE-ACETAMINOPHEN 10-325 MG PO TABS: 1.0000 | ORAL_TABLET | ORAL | 0 refills | Status: DC

## 2018-04-01 ENCOUNTER — Non-Acute Institutional Stay (SKILLED_NURSING_FACILITY): Payer: Medicare Other | Admitting: Nurse Practitioner

## 2018-04-01 ENCOUNTER — Encounter: Payer: Self-pay | Admitting: Nurse Practitioner

## 2018-04-01 DIAGNOSIS — W19XXXA Unspecified fall, initial encounter: Secondary | ICD-10-CM

## 2018-04-01 DIAGNOSIS — F0280 Dementia in other diseases classified elsewhere without behavioral disturbance: Secondary | ICD-10-CM

## 2018-04-01 DIAGNOSIS — S0003XA Contusion of scalp, initial encounter: Secondary | ICD-10-CM

## 2018-04-01 DIAGNOSIS — Z86718 Personal history of other venous thrombosis and embolism: Secondary | ICD-10-CM

## 2018-04-01 DIAGNOSIS — R269 Unspecified abnormalities of gait and mobility: Secondary | ICD-10-CM

## 2018-04-01 DIAGNOSIS — G301 Alzheimer's disease with late onset: Secondary | ICD-10-CM | POA: Diagnosis not present

## 2018-04-01 NOTE — Assessment & Plan Note (Signed)
Continue Xarelto 15mg  qd for hx of DVTs.

## 2018-04-01 NOTE — Progress Notes (Signed)
Location:  [place] Room Number: 35 Place of Service:  SNF (31) Provider:  Marlana Latus  NP  Virgie Dad, MD  Patient Care Team: Virgie Dad, MD as PCP - General (Internal Medicine) Annia Belt, MD as Consulting Physician (Hematology and Oncology) Suella Broad, MD as Consulting Physician (Physical Medicine and Rehabilitation) Noralee Space, MD (Pulmonary Disease) [place] Elizjah Noblet X, NP as Nurse Practitioner (Nurse Practitioner) Inda Castle, MD (Inactive) as Consulting Physician (Gastroenterology)  Extended Emergency Contact Information Primary Emergency Contact: Fernholz,Gene Address: [address]          [place], [place] [address] [place] Phone: [phone] Work Phone: [phone] Mobile Phone: [phone] Relation: Son Secondary Emergency Contact: [name], [place] [address] [place] Phone: [phone] Relation: Son  Code Status:  DNR Goals of care: Advanced Directive information Advanced Directives 04/01/2018  Does Patient Have a Medical Advance Directive? Yes  Type of Paramedic of [place];Living will;Out of facility DNR (pink MOST or yellow form)  Does patient want to make changes to medical advance directive? No - Patient declined  Copy of [place] in Chart? Yes - validated most recent copy scanned in chart (See row information)  Pre-existing out of facility DNR order (yellow form or pink MOST form) Yellow form placed in chart (order not valid for inpatient use)     Chief Complaint  Patient presents with  . Acute Visit    Fall- hematoma    HPI:  Pt is a 85 y.o. female seen today for an acute visit for a small hematoma left back of head sustained from a fall 03/31/18 when the patient was reported unwitnessed fall. No s/s of intracranial process or s/s of bleeding noted, she is on Xarelto  15mg  qd for Hx of DVTs. She resides in [facility] for safety and care assistance, on Memantine 10mg  bid for memory. She ambulates with w/c on unit.    Past Medical History:  Diagnosis Date  . Abnormal weight gain 05/09/2012  . Achalasia of esophagus 09/18/2013   09/18/13 Nifedipine 10mg  tid ac meals. 11/17/14 [facility] f/u as neeed   . Anxiety and depression 03/12/2012   Increased Sertraline to 50mg  daily 05/11/95 08/26/13 Mirtazapine 7.5mg  qhs  10/12/14 Pharm decreased Sertraline to 25mg .  05/04/15 TSH 2.89 09/15/15 pharm taper off Sertraline.  10/08/15 continue Sertraline 25mg  daily, POA desires. 12/02/15 wbc 6.1, Hgb 12.0, plt 271    . Asthmatic bronchitis   . BACK PAIN, LUMBAR 03/12/2007   Hx of lower back pain, had X-ray of her lumbar spine 09/08/12--spondylosis-better controlled with Hydrocodone/ApAp  10/325mg  ac and hs  01/01/13 lumbar spine inj. Able to ambulate with walker on unit until her fall 06/22/13. 06/25/13 unremarkable. X-ray lumbar spine. 11/10/13 dc Oxycodone prn-not used in 60 days.     . COLONIC POLYPS 03/12/2007   Initially noted April 2002 colonoscopy. Followup colonoscopy 05/31/2006 did not disclose any further polyps.    Marland Kitchen COPD (chronic obstructive pulmonary disease) ([place])   . Coronary artery disease   . Deep vein thrombosis of left lower extremity ([place]) 07/19/2012  . Dementia ([place]) 09/16/2012   05/04/15 TSH 2.89   . Depression   . Diverticulosis 05/10/2012  . DJD (degenerative joint disease)   . Dry skin dermatitis 11/18/2012  . Edema of leg 07/01/2010  . Epistaxis 01/24/2016  01/21/16: x2, held Xarelto x 1 day, Saline Nasal spray prn, wbc 9.5, Hgb 12.4, Plt 331, Na 138, K 4.5, Bun 25, creat 0.89 01/22/16 GQQ76(19-50)  . Esophageal dysmotility 07/26/2013  . Esophageal dysphagia 05/27/2010   New dx of achalasia 6/ 2015 09/18/13 Nifedipine 10mg  tid ac meals.    . Esophageal stricture   . Essential hypertension, benign 10/30/2013   12/02/15 wbc 6.1, Hgb 12.0, plt 271   . Essential thrombocythemia  ([place]) 06/09/2009  . Fall 06/25/2013  . GERD (gastroesophageal reflux disease)   . Hip fracture ([place]) 09/08/2012  . HYPERCHOLESTEROLEMIA 03/12/2007   08/10/15 cholesterol 119, triglycerides 143, HDL 51, LDL 39   . Lumbar back pain   . MACULAR DEGENERATION 01/26/2008   Qualifier: Diagnosis of  By: Lenna Gilford MD, Deborra Medina   . Osteoarthritis 03/12/2007  . Osteoporosis 03/12/2007   Qualifier: Diagnosis of  By: Julien Girt CMA, Marliss Czar  06/15/14 dc Vit D 1000u po daily per pharm.    . Phlebitis and thrombophlebitis of other deep vessels of lower extremities 07/10/2010  . S/P balloon dilatation of esophageal stricture 07/28/2013   07/26/13. Dr. Henrene Pastor. EGD foreign removal and Balloon dilation of esophagus. Esophageal dysmotility, suspected achalasia, dilation at the GE junction to 74mm. Candida esophagitis.    . SYNCOPE 07/30/2009   Occurred 2011. Hospitalized. Felt due to dehydration.    . TRANSIENT ISCHEMIC ATTACKS, HX OF 03/12/2007   Qualifier: Diagnosis of  By: Julien Girt CMA, Marliss Czar     Past Surgical History:  Procedure Laterality Date  . ABDOMINAL HYSTERECTOMY     and bso  . APPENDECTOMY    . BOTOX INJECTION N/A 09/18/2013   Procedure: BOTOX INJECTION;  Surgeon: Lafayette Dragon, MD;  Location: [facility] END[facility]COPY;  Service: Endoscopy;  Laterality: N/A;  . BOTOX INJECTION N/A 10/16/2014   Procedure: BOTOX INJECTION;  Surgeon: Inda Castle, MD;  Location: [facility] END[facility];  Service: Endoscopy;  Laterality: N/A;  . BOTOX INJECTION N/A 05/18/2016   Procedure: BOTOX INJECTION;  Surgeon: Milus Banister, MD;  Location: [facility];  Service: Endoscopy;  Laterality: N/A;  . BOTOX INJECTION N/A 02/14/2018   Procedure: BOTOX INJECTION;  Surgeon: Milus Banister, MD;  Location: [facility];  Service: Endoscopy;  Laterality: N/A;  . CATARACT EXTRACTION W/ INTRAOCULAR LENS  IMPLANT, BILATERAL    . CHOLECYSTECTOMY    . ESOPHAGEAL MANOMETRY N/A 08/25/2013   Procedure: ESOPHAGEAL MANOMETRY (EM);  Surgeon: Jerene Bears, MD;  Location: [facility]  ENDOSCOPY;  Service: Gastroenterology;  Laterality: N/A;  . ESOPHAGOGASTRODUODENOSCOPY N/A 07/26/2013   Procedure: ESOPHAGOGASTRODUODENOSCOPY (EGD);  Surgeon: Irene Shipper, MD;  Location: [facility] ENDOSCOPY;  Service: Endoscopy;  Laterality: N/A;  . ESOPHAGOGASTRODUODENOSCOPY N/A 10/16/2014   Procedure: ESOPHAGOGASTRODUODENOSCOPY (EGD);  Surgeon: Inda Castle, MD;  Location: [facility];  Service: Endoscopy;  Laterality: N/A;  with botox  . ESOPHAGOGASTRODUODENOSCOPY (EGD) WITH ESOPHAGEAL DILATION N/A 11/25/2012   Procedure: ESOPHAGOGASTRODUODENOSCOPY (EGD) WITH ESOPHAGEAL DILATION;  Surgeon: Jerene Bears, MD;  [facility];  Service: Gastroenterology;  Laterality: N/A;  . ESOPHAGOGASTRODUODENOSCOPY (EGD) WITH PROPOFOL N/A 09/18/2013   Procedure: ESOPHAGOGASTRODUODENOSCOPY (EGD) WITH PROPOFOL;  Surgeon: Lafayette Dragon, MD;  [facility];  Service: Endoscopy;  Laterality: N/A;  . ESOPHAGOGASTRODUODENOSCOPY (EGD) WITH PROPOFOL N/A 05/18/2016   Procedure: ESOPHAGOGASTRODUODENOSCOPY (EGD) WITH PROPOFOL;  Surgeon: Milus Banister, MD;  [facility];  Service: Endoscopy;  Laterality: N/A;  . ESOPHAGOGASTRODUODENOSCOPY (EGD) WITH PROPOFOL N/A 02/14/2018   Procedure: ESOPHAGOGASTRODUODENOSCOPY (EGD) WITH PROPOFOL;  Surgeon: Milus Banister,  MD;  [facility];  Service: Endoscopy;  Laterality: N/A;  . HIP ARTHROPLASTY Right 09/09/2012   Procedure: ARTHROPLASTY BIPOLAR HIP;  Surgeon: Mauri Pole, MD;  Location: [facility];  Service: Orthopedics;  Laterality: Right;  . TAH and BSO  08/21/1995    Allergies  Allergen Reactions  . Sulfamethoxazole Rash    Outpatient Encounter Medications as of 04/01/2018  Medication Sig  . acetaminophen (TYLENOL) 325 MG tablet Take 650 mg by mouth every 8 (eight) hours as needed for moderate pain.   Marland Kitchen albuterol (PROVENTIL HFA;VENTOLIN HFA) 108 (90 BASE) MCG/ACT inhaler Inhale 2 puffs into the lungs every 6 (six) hours as needed for wheezing or  shortness of breath.  Marland Kitchen atorvastatin (LIPITOR) 10 MG tablet Take 10 mg by mouth at bedtime.   . chlorhexidine (PERIDEX) 0.12 % solution Use as directed 5 mLs in the mouth or throat daily. SWISH AND SPIT 5ML FOR 30 SECONDS EVERYDAY INDEFINITELY  . cholecalciferol (VITAMIN D) 1000 units tablet Take 1,000 Units by mouth daily.  . fluticasone furoate-vilanterol (BREO ELLIPTA) 100-25 MCG/INH AEPB Inhale 1 puff into the lungs daily. One puff inhale once a day, rinse mouth after each use.   . Humidifier MISC Please use humidifier to prevent excessive dryness.  Marland Kitchen HYDROcodone-acetaminophen (NORCO) 10-325 MG tablet Take 1 tablet by mouth See admin instructions. Take 1 tablet by mouth SCHEDULED twice daily; may take an additional tablet once daily as needed for chronic pain.  . hydroxyurea (HYDREA) 500 MG capsule Take by mouth See admin instructions. Take 1 capsule on Sunday, Wednesday and Friday. Take 2 capsules (1000 mg) by mouth on Monday only.  WEAR GLOVES WHEN HANDLING THIS MEDICATION.  . memantine (NAMENDA) 10 MG tablet Take 10 mg by mouth 2 (two) times daily.   . Menthol, Topical Analgesic, (BIOFREEZE) 4 % GEL Apply topically 4 (four) times daily.  . metoprolol tartrate (LOPRESSOR) 25 MG tablet Take 25 mg by mouth 2 (two) times daily.  . montelukast (SINGULAIR) 10 MG tablet Take 10 mg by mouth at bedtime.  . Multiple Vitamins-Minerals (PRESERVISION AREDS 2) CAPS Take 1 capsule by mouth 2 (two) times daily.  Marland Kitchen NIFEdipine (PROCARDIA) 10 MG capsule Take 10 mg by mouth 3 (three) times daily before meals. Squeeze contents of one capsule under tongue 15-30 minutes before meals 3 times a day  . nitroGLYCERIN (NITROSTAT) 0.4 MG SL tablet Place 0.4 mg under the tongue every 5 (five) minutes as needed for chest pain.  Marland Kitchen omeprazole (PRILOSEC) 20 MG capsule Take 20 mg by mouth at bedtime.   . Rivaroxaban (XARELTO) 15 MG TABS tablet Take 15 mg by mouth daily. 5pm  . sertraline (ZOLOFT) 25 MG tablet Take 25 mg by  mouth daily.  . sodium chloride (OCEAN) 0.65 % nasal spray Place 1 spray into the nose 2 (two) times daily.  . sodium fluoride (DENTA 5000 PLUS) 1.1 % CREA dental cream Place 1 application onto teeth at bedtime. Apply a thin ribbon  . [DISCONTINUED] doxycycline (VIBRAMYCIN) 100 MG capsule Take 100 mg by mouth 2 (two) times daily.  . [DISCONTINUED] saccharomyces boulardii (FLORASTOR) 250 MG capsule Take 250 mg by mouth 2 (two) times daily.   No facility-administered encounter medications on file as of 04/01/2018.    ROS was provided with assistance of staff Review of Systems  Constitutional: Negative for activity change, appetite change, chills, diaphoresis, fatigue and fever.  HENT: Positive for hearing loss and trouble swallowing. Negative for congestion and voice change.  Respiratory: Negative for cough, shortness of breath and wheezing.   Cardiovascular: Negative for chest pain, palpitations and leg swelling.  Gastrointestinal: Negative for abdominal distention and abdominal pain.  Genitourinary: Negative for difficulty urinating, dysuria, hematuria and urgency.  Musculoskeletal: Positive for arthralgias, back pain and gait problem.  Skin: Positive for color change.       A marble sized hematoma back of the left scalp.   Neurological: Negative for dizziness, speech difficulty, weakness and headaches.       Dementia  Hematological: Bruises/bleeds easily.  Psychiatric/Behavioral: Positive for confusion. Negative for agitation, behavioral problems, hallucinations and sleep disturbance. The patient is not nervous/anxious.     Immunization History  Administered Date(s) Administered  . Influenza Split 11/16/2010, 12/13/2011  . Influenza Whole 11/27/2009, 11/29/2017  . Influenza-Unspecified 12/31/2013, 11/18/2014, 12/14/2015, 12/11/2016  . PPD Test 09/11/2012  . Pneumococcal Polysaccharide-23 02/28/1988  . Tdap 05/31/2017   Pertinent  Health Maintenance Due  Topic Date Due  . PNA vac  Low Risk Adult (2 of 2 - PCV13) 02/27/1989  . INFLUENZA VACCINE  Completed  . DEXA SCAN  Completed   Fall Risk  02/08/2017 02/08/2017 10/04/2015 12/28/2014 10/23/2014  Falls in the past year? No Exclusion - non ambulatory No Yes Yes  Number falls in past yr: - - - 1 1  Injury with Fall? - - - No No  Risk for fall due to : - Impaired balance/gait;Impaired vision Impaired mobility;Impaired balance/gait History of fall(s);Impaired balance/gait;Impaired mobility Impaired mobility  Follow up - - - Falls prevention discussed Falls evaluation completed   Functional Status Survey:    Vitals:   04/01/18 1108  BP: 118/60  Pulse: 68  Resp: 18  Temp: 98 F (36.7 C)  SpO2: 96%  Weight: 97 lb 12.8 oz (44.4 kg)  Height: 5\' 1"  (1.549 m)   Body mass index is 18.48 kg/m. Physical Exam Constitutional:      General: She is not in acute distress.    Appearance: Normal appearance. She is not ill-appearing, toxic-appearing or diaphoretic.  HENT:     Head: Normocephalic.     Nose: Nose normal. No congestion.     Mouth/Throat:     Mouth: Mucous membranes are moist.  Eyes:     Extraocular Movements: Extraocular movements intact.     Pupils: Pupils are equal, round, and reactive to light.  Neck:     Musculoskeletal: Normal range of motion.  Cardiovascular:     Rate and Rhythm: Normal rate and regular rhythm.     Heart sounds: No murmur.  Pulmonary:     Effort: Pulmonary effort is normal.     Breath sounds: Rales present. No wheezing or rhonchi.     Comments: Bibasilar dry rales.  Abdominal:     General: There is no distension.     Palpations: Abdomen is soft.     Tenderness: There is no abdominal tenderness. There is no guarding or rebound.  Musculoskeletal:     Right lower leg: No edema.     Left lower leg: No edema.     Comments: Ambulates with w/c  Skin:    General: Skin is warm and dry.     Findings: Bruising and erythema present.     Comments: Lateral right ankle. A marble sized  hematoma back of the left scalp.   Neurological:     General: No focal deficit present.     Mental Status: She is alert. Mental status is at baseline.     Cranial  Nerves: No cranial nerve deficit.     Motor: No weakness.     Coordination: Coordination normal.     Gait: Gait abnormal.     Comments: Oriented to person and place.   Psychiatric:        Mood and Affect: Mood normal.        Behavior: Behavior normal.     Labs reviewed: Recent Labs    09/18/17 11/21/17  NA 142 145  K 3.8 4.0  CL 104 111  CO2 30 29  BUN 21 17  CREATININE 0.8 0.7  CALCIUM 9.4 8.5  MG 1.9  --    Recent Labs    09/18/17 11/21/17  AST 19 12*  ALT 13 10  ALKPHOS 71 66  PROT 6.8 5.1  ALBUMIN 4.2 3.2   Recent Labs    07/03/17  10/29/17 11/21/17 01/01/18  WBC 6.9   < > 6.3 5.7 5.6  NEUTROABS 4,264  --   --   --   --   HGB 12.3   < > 12.3 11.2* 11.6*  HCT 36   < > 36 33* 34*  PLT 269   < > 264 333 292   < > = values in this interval not displayed.   Lab Results  Component Value Date   TSH 2.00 09/21/2016   Lab Results  Component Value Date   HGBA1C 5.5 10/16/2014   Lab Results  Component Value Date   CHOL 143 09/18/2017   HDL 50 09/18/2017   LDLCALC 70 09/18/2017   LDLDIRECT 152.3 06/04/2009   TRIG 147 09/18/2017   CHOLHDL 3 09/13/2011    Significant Diagnostic Results in last 30 days:  No results found.  Assessment/Plan Scalp hematoma, initial encounter Small a marble sized hematoma back of the left scalp, no s/s of intracranial process or bleeding, resume Xarelto 15mg  qd, it should heal.   Fall Lack of safety awareness and unsteady gait are contributory to falling, close supervision for safety needed.   Alzheimer's dementia without behavioral disturbance Continue SNF [facility] for safety and care assistance, continue Memantine 10mg  bid for memory.   History of DVT (deep vein thrombosis) Continue Xarelto 15mg  qd for hx of DVTs.   Gait abnormality Continue to ambulate with w/c.       Family/ staff Communication: plan of care reviewed with the patient and charge nurse.   Labs/tests ordered:  none  Time spend 25 minutes.

## 2018-04-01 NOTE — Assessment & Plan Note (Signed)
Small a marble sized hematoma back of the left scalp, no s/s of intracranial process or bleeding, resume Xarelto 15mg  qd, it should heal.

## 2018-04-01 NOTE — Assessment & Plan Note (Signed)
Continue to ambulate with w/c.

## 2018-04-01 NOTE — Assessment & Plan Note (Signed)
Lack of safety awareness and unsteady gait are contributory to falling, close supervision for safety needed.

## 2018-04-01 NOTE — Assessment & Plan Note (Signed)
Continue SNF [facility] for safety and care assistance, continue Memantine 10mg bid for memory 

## 2018-04-03 NOTE — Progress Notes (Signed)
HEMATOLOGY/ONCOLOGY CONSULTATION NOTE  Date of Service: 04/04/2018  Patient Care Team: Virgie Dad, MD as PCP - General (Internal Medicine) Annia Belt, MD as Consulting Physician (Hematology and Oncology) Suella Broad, MD as Consulting Physician (Physical Medicine and Rehabilitation) Noralee Space, MD (Pulmonary Disease) [name], [name], NP as Nurse Practitioner (Nurse Practitioner) Inda Castle, MD (Inactive) as Consulting Physician (Gastroenterology)  CHIEF COMPLAINTS/PURPOSE OF CONSULTATION:  Essential Thrombocytosis  HISTORY OF PRESENTING ILLNESS:   Sherri Crawford is a wonderful 85 y.o. female who has been referred to [place] by Dr. Murriel Hopper for evaluation and management of Essential Thrombocytosis. She is accompanied today by her son. The pt reports that she is doing well overall.  The pt's son reports that the pt was diagnosed with essential thrombocytosis in 2011. He adds that she had a DVT in 2012. The pt lives at [facility] and has labs drawn with some regularity. The pt had cellulitis within the last month and has been evaluated since that time.  The pt has been seen infrequently and has been stable on a weekly Hydroxyurea course of 1000mg  on Mondays, and 500mg  on Sunday, Wednesday and Friday.   The pt does not ambulate and sits in a wheelchair most of the time. The pt denies any concerns at this time.  Most recent lab results (03/07/18) of CBC is as follows: all values are WNL except for RBC at 3.10, HGB at 11.2, HCT at 33.2, MCV at 107.1, MCH at 36.1.  On review of systems, pt reports feeling well, stable energy levels, eating well, and denies leg swelling, abdominal pains, and any other symptoms.  On PMHx the pt reports Essential thrombocytosis, DVT in 2012   MEDICAL HISTORY:  Past Medical History:  Diagnosis Date  . Abnormal weight gain 05/09/2012  . Achalasia of esophagus 09/18/2013   09/18/13 Nifedipine 10mg  tid ac  meals. 11/17/14 [facility] f/u as neeed   . Anxiety and depression 03/12/2012   Increased Sertraline to 50mg  daily 07/16/13 08/26/13 Mirtazapine 7.5mg  qhs  10/12/14 Pharm decreased Sertraline to 25mg .  05/04/15 TSH 2.89 09/15/15 pharm taper off Sertraline.  10/08/15 continue Sertraline 25mg  daily, POA desires. 12/02/15 wbc 6.1, Hgb 12.0, plt 271    . Asthmatic bronchitis   . BACK PAIN, LUMBAR 03/12/2007   Hx of lower back pain, had X-ray of her lumbar spine 09/08/12--spondylosis-better controlled with Hydrocodone/ApAp  10/325mg  ac and hs  01/01/13 lumbar spine inj. Able to ambulate with walker on unit until her fall 06/22/13. 06/25/13 unremarkable. X-ray lumbar spine. 11/10/13 dc Oxycodone prn-not used in 60 days.     . COLONIC POLYPS 03/12/2007   Initially noted April 2002 colonoscopy. Followup colonoscopy 05/31/2006 did not disclose any further polyps.    Marland Kitchen COPD (chronic obstructive pulmonary disease) ([place])   . Coronary artery disease   . Deep vein thrombosis of left lower extremity ([place]) 07/19/2012  . Dementia ([place]) 09/16/2012   05/04/15 TSH 2.89   . Depression   . Diverticulosis 05/10/2012  . DJD (degenerative joint disease)   . Dry skin dermatitis 11/18/2012  . Edema of leg 07/01/2010  . Epistaxis 01/24/2016   01/21/16: x2, held Xarelto x 1 day, Saline Nasal spray prn, wbc 9.5, Hgb 12.4, Plt 331, Na 138, K 4.5, Bun 25, creat 0.89 01/22/16 MHD62(22-97)  . Esophageal dysmotility 07/26/2013  . Esophageal dysphagia 05/27/2010   New dx of achalasia 6/ 2015 09/18/13 Nifedipine 10mg  tid ac meals.    Marland Kitchen  Esophageal stricture   . Essential hypertension, benign 10/30/2013   12/02/15 wbc 6.1, Hgb 12.0, plt 271   . Essential thrombocythemia ([place]) 06/09/2009  . Fall 06/25/2013  . GERD (gastroesophageal reflux disease)   . Hip fracture ([place]) 09/08/2012  . HYPERCHOLESTEROLEMIA 03/12/2007   08/10/15 cholesterol 119, triglycerides 143, HDL 51, LDL 39   . Lumbar back pain   . MACULAR DEGENERATION 01/26/2008   Qualifier: Diagnosis of   By: Lenna Gilford MD, Deborra Medina   . Osteoarthritis 03/12/2007  . Osteoporosis 03/12/2007   Qualifier: Diagnosis of  By: Julien Girt CMA, Marliss Czar  06/15/14 dc Vit D 1000u po daily per pharm.    . Phlebitis and thrombophlebitis of other deep vessels of lower extremities 07/10/2010  . S/P balloon dilatation of esophageal stricture 07/28/2013   07/26/13. Dr. Henrene Pastor. EGD foreign removal and Balloon dilation of esophagus. Esophageal dysmotility, suspected achalasia, dilation at the GE junction to 44mm. Candida esophagitis.    . SYNCOPE 07/30/2009   Occurred 2011. Hospitalized. Felt due to dehydration.    . TRANSIENT ISCHEMIC ATTACKS, HX OF 03/12/2007   Qualifier: Diagnosis of  By: Julien Girt CMA, Leigh      SURGICAL HISTORY: Past Surgical History:  Procedure Laterality Date  . ABDOMINAL HYSTERECTOMY     and bso  . APPENDECTOMY    . BOTOX INJECTION N/A 09/18/2013   Procedure: BOTOX INJECTION;  Surgeon: Lafayette Dragon, MD;  Location: [facility];  Service: Endoscopy;  Laterality: N/A;  . BOTOX INJECTION N/A 10/16/2014   Procedure: BOTOX INJECTION;  Surgeon: Inda Castle, MD;  Location: [facility];  Service: Endoscopy;  Laterality: N/A;  . BOTOX INJECTION N/A 05/18/2016   Procedure: BOTOX INJECTION;  Surgeon: Milus Banister, MD;  Location: [facility];  Service: Endoscopy;  Laterality: N/A;  . BOTOX INJECTION N/A 02/14/2018   Procedure: BOTOX INJECTION;  Surgeon: Milus Banister, MD;  Location: [facility];  Service: Endoscopy;  Laterality: N/A;  . CATARACT EXTRACTION W/ INTRAOCULAR LENS  IMPLANT, BILATERAL    . CHOLECYSTECTOMY    . ESOPHAGEAL MANOMETRY N/A 08/25/2013   Procedure: ESOPHAGEAL MANOMETRY (EM);  Surgeon: Jerene Bears, MD;  Location: [facility];  Service: Gastroenterology;  Laterality: N/A;  . ESOPHAGOGASTRODUODENOSCOPY N/A 07/26/2013   Procedure: ESOPHAGOGASTRODUODENOSCOPY (EGD);  Surgeon: Irene Shipper, MD;  Location: [facility] ENDOSCOPY;  Service: Endoscopy;  Laterality: N/A;  . ESOPHAGOGASTRODUODENOSCOPY N/A  10/16/2014   Procedure: ESOPHAGOGASTRODUODENOSCOPY (EGD);  Surgeon: Inda Castle, MD;  Location: [facility];  Service: Endoscopy;  Laterality: N/A;  with botox  . ESOPHAGOGASTRODUODENOSCOPY (EGD) WITH ESOPHAGEAL DILATION N/A 11/25/2012   Procedure: ESOPHAGOGASTRODUODENOSCOPY (EGD) WITH ESOPHAGEAL DILATION;  Surgeon: Jerene Bears, MD;  Location: [facility];  Service: Gastroenterology;  Laterality: N/A;  . ESOPHAGOGASTRODUODENOSCOPY (EGD) WITH PROPOFOL N/A 09/18/2013   Procedure: ESOPHAGOGASTRODUODENOSCOPY (EGD) WITH PROPOFOL;  Surgeon: Lafayette Dragon, MD;  Location: [facility];  Service: Endoscopy;  Laterality: N/A;  . ESOPHAGOGASTRODUODENOSCOPY (EGD) WITH PROPOFOL N/A 05/18/2016   Procedure: ESOPHAGOGASTRODUODENOSCOPY (EGD) WITH PROPOFOL;  Surgeon: Milus Banister, MD;  Location: [facility];  Service: Endoscopy;  Laterality: N/A;  . ESOPHAGOGASTRODUODENOSCOPY (EGD) WITH PROPOFOL N/A 02/14/2018   Procedure: ESOPHAGOGASTRODUODENOSCOPY (EGD) WITH PROPOFOL;  Surgeon: Milus Banister, MD;  Location: [facility];  Service: Endoscopy;  Laterality: N/A;  . HIP ARTHROPLASTY Right 09/09/2012   Procedure: ARTHROPLASTY BIPOLAR HIP;  Surgeon: Mauri Pole, MD;  Location: [facility];  Service: Orthopedics;  Laterality: Right;  . TAH and BSO  08/21/1995    SOCIAL HISTORY: Social  History   Socioeconomic History  . Marital status: Widowed    Spouse name: Gwyndolyn Saxon  . Number of children: 3  . Years of education: Not on file  . Highest education level: Not on file  Occupational History  . 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 Financial resource strain: Not hard at all  . Food insecurity:    Worry: Patient refused    Inability: Patient refused  . Transportation needs:    Medical: Patient refused    Non-medical: Patient refused  Tobacco Use  . Smoking status: Never Smoker  . Smokeless tobacco: Never Used  Substance and Sexual Activity  . Alcohol  use: No    Alcohol/week: 0.0 standard drinks  . Drug use: No  . Sexual activity: Never  Lifestyle  . Physical activity:    Days per week: Patient refused    Minutes per session: Patient refused  . Stress: Not at all  Relationships  . Social connections:    Talks on phone: Never    Gets together: Patient refused    Attends religious service: More than 4 times per year    Active member of club or organization: Yes    Attends meetings of clubs or organizations: Patient refused    Relationship status: Widowed  . Intimate partner violence:    Fear of current or ex partner: Patient refused    Emotionally abused: Patient refused    Physically abused: Patient refused    Forced sexual activity: Patient refused  Other Topics Concern  . Not on file  Social History Narrative   Lives at [facility] since 09/11/12   Widowed   Never Smoked   Alcohol none   POA, DNR, Living Will        FAMILY HISTORY: Family History  Problem Relation Age of Onset  . Colon cancer Son 55  . Crohn's disease Son   . Kidney failure Son   . Pancreatitis Son   . AAA (abdominal aortic aneurysm) Son   . Cancer Maternal Aunt     ALLERGIES:  is allergic to sulfamethoxazole.  MEDICATIONS:  Current Outpatient Medications  Medication Sig Dispense Refill  . acetaminophen (TYLENOL) 325 MG tablet Take 650 mg by mouth every 8 (eight) hours as needed for moderate pain.     Marland Kitchen albuterol (PROVENTIL HFA;VENTOLIN HFA) 108 (90 BASE) MCG/ACT inhaler Inhale 2 puffs into the lungs every 6 (six) hours as needed for wheezing or shortness of breath.    Marland Kitchen atorvastatin (LIPITOR) 10 MG tablet Take 10 mg by mouth at bedtime.     . chlorhexidine (PERIDEX) 0.12 % solution Use as directed 5 mLs in the mouth or throat daily. SWISH AND SPIT 5ML FOR 30 SECONDS EVERYDAY INDEFINITELY    . cholecalciferol (VITAMIN D) 1000 units tablet Take 1,000 Units by mouth daily.    . fluticasone furoate-vilanterol (BREO ELLIPTA) 100-25  MCG/INH AEPB Inhale 1 puff into the lungs daily. One puff inhale once a day, rinse mouth after each use.     . Humidifier MISC Please use humidifier to prevent excessive dryness. 1 each 0  . HYDROcodone-acetaminophen (NORCO) 10-325 MG tablet Take 1 tablet by mouth See admin instructions. Take 1 tablet by mouth SCHEDULED twice daily; may take an additional tablet once daily as needed for chronic pain. 60 tablet 0  . hydroxyurea (HYDREA) 500 MG capsule Take 1 capsule on Sunday, Wednesday and Friday. Take 2 capsules (1000  mg) by mouth on Monday only.  WEAR GLOVES WHEN HANDLING THIS MEDICATION. 90 capsule 3  . memantine (NAMENDA) 10 MG tablet Take 10 mg by mouth 2 (two) times daily.     . Menthol, Topical Analgesic, (BIOFREEZE) 4 % GEL Apply topically 4 (four) times daily.    . metoprolol tartrate (LOPRESSOR) 25 MG tablet Take 25 mg by mouth 2 (two) times daily.    . montelukast (SINGULAIR) 10 MG tablet Take 10 mg by mouth at bedtime.    . Multiple Vitamins-Minerals (PRESERVISION AREDS 2) CAPS Take 1 capsule by mouth 2 (two) times daily.    Marland Kitchen NIFEdipine (PROCARDIA) 10 MG capsule Take 10 mg by mouth 3 (three) times daily before meals. Squeeze contents of one capsule under tongue 15-30 minutes before meals 3 times a day    . nitroGLYCERIN (NITROSTAT) 0.4 MG SL tablet Place 0.4 mg under the tongue every 5 (five) minutes as needed for chest pain.    Marland Kitchen omeprazole (PRILOSEC) 20 MG capsule Take 20 mg by mouth at bedtime.     . Rivaroxaban (XARELTO) 15 MG TABS tablet Take 15 mg by mouth daily. 5pm    . sertraline (ZOLOFT) 25 MG tablet Take 25 mg by mouth daily.    . sodium chloride (OCEAN) 0.65 % nasal spray Place 1 spray into the nose 2 (two) times daily.    . sodium fluoride (DENTA 5000 PLUS) 1.1 % CREA dental cream Place 1 application onto teeth at bedtime. Apply a thin ribbon     No current facility-administered medications for this visit.     REVIEW OF SYSTEMS:    10 Point review of Systems was done  is negative except as noted above.  PHYSICAL EXAMINATION: ECOG PERFORMANCE STATUS: 3 - Symptomatic, >50% confined to bed  . Vitals:   04/04/18 1314  BP: 116/90  Pulse: 63  Resp: 17  Temp: 98.2 F (36.8 C)  SpO2: 96%   There were no vitals filed for this visit. .There is no height or weight on file to calculate BMI.  GENERAL:alert, in no acute distress and comfortable SKIN: no acute rashes, no significant lesions EYES: conjunctiva are pink and non-injected, sclera anicteric OROPHARYNX: MMM, no exudates, no oropharyngeal erythema or ulceration NECK: supple, no JVD LYMPH:  no palpable lymphadenopathy in the cervical, axillary or inguinal regions LUNGS: clear to auscultation b/l with normal respiratory effort HEART: regular rate & rhythm ABDOMEN:  normoactive bowel sounds , non tender, not distended. Extremity: no pedal edema PSYCH: alert & oriented x 3 with fluent speech NEURO: no focal motor/sensory deficits  LABORATORY DATA:  I have reviewed the data as listed  . CBC Latest Ref Rng & Units 01/01/2018 11/21/2017 10/29/2017  WBC - 5.6 5.7 6.3  Hemoglobin 12.0 - 16.0 11.6(A) 11.2(A) 12.3  Hematocrit 36 - 46 34(A) 33(A) 36  Platelets 150 - 399 292 333 264    . CMP Latest Ref Rng & Units 11/21/2017 09/18/2017 01/30/2017  Glucose 65 - 99 mg/dL - - -  BUN 4 - 21 17 21 19   Creatinine 0.5 - 1.1 0.7 0.8 0.8  Sodium 137 - 147 145 142 139  Potassium 3.4 - 5.3 4.0 3.8 4.3  Chloride - 111 104 -  CO2 - 29 30 -  Calcium - 8.5 9.4 -  Total Protein g/dL 5.1 6.8 -  Total Bilirubin 0.3 - 1.2 mg/dL - - -  Alkaline Phos 25 - 125 66 71 68  AST 13 - 35 12(A) 19  17  ALT 7 - 35 10 13 11    03/07/18 Outside Labs:       RADIOGRAPHIC STUDIES: I have personally reviewed the radiological images as listed and agreed with the findings in the report. No results found.  ASSESSMENT & PLAN:  85 y.o. female with  1. Essential Thrombocytosis Diagnosed on 06/16/09 JAK2 [id number] mutation  On 1000mg   Hydroxyurea on Mondays, 500mg  "Sunday, Wednesdays and Fridays  2. History of DVT in May 2012 On 15mg Xarelto every day  PLAN: -Discussed patient's most recent labs from 03/07/18, WBC normal at 6.2k, HGB at 11.2, PLT at 296k -[facility]- labs to be faxed to our office to check for hydrea tolerance andto determine dose adjustments. -Continue with Hydroxyurea course of 1000mg on Monday, and 500mg on Sundays, Wednesdays and Fridays -Pt's son notes that it is becoming increasingly difficult for the pt to make clinic visits, but continues to receive regular medical care at her nursing facility. She has been stable on her Hydroxyurea dose for several years. -Will see the pt back in 6 months    RTC with Dr Temiloluwa Laredo with labs in 6 months   All of the patients questions were answered with apparent satisfaction. The patient knows to call the clinic with any problems, questions or concerns.  The total time spent in the appt was 30 minutes and more than 50% was on counseling and direct patient cares.    Bernise Sylvain MD MS AAHIVMS SCH CTH Hematology/Oncology Physician [facility]  (Office):       [phone] (Work cell):  [phone] (Fax):           [phone]" [phone]  04/04/2018 1:53 PM  I, Schuyler Bain, am acting as a scribe for Dr. Sullivan Lone.   .I have reviewed the above documentation for accuracy and completeness, and I agree with the above. Brunetta Genera MD

## 2018-04-04 ENCOUNTER — Inpatient Hospital Stay: Payer: Medicare Other | Attending: Hematology | Admitting: Hematology

## 2018-04-04 ENCOUNTER — Telehealth: Payer: Self-pay

## 2018-04-04 VITALS — BP 116/90 | HR 63 | Temp 98.2°F | Resp 17

## 2018-04-04 DIAGNOSIS — Z86718 Personal history of other venous thrombosis and embolism: Secondary | ICD-10-CM | POA: Diagnosis not present

## 2018-04-04 DIAGNOSIS — D473 Essential (hemorrhagic) thrombocythemia: Secondary | ICD-10-CM

## 2018-04-04 DIAGNOSIS — Z7901 Long term (current) use of anticoagulants: Secondary | ICD-10-CM | POA: Diagnosis not present

## 2018-04-04 DIAGNOSIS — Z79899 Other long term (current) drug therapy: Secondary | ICD-10-CM | POA: Diagnosis not present

## 2018-04-04 MED ORDER — HYDROXYUREA 500 MG PO CAPS: ORAL_CAPSULE | ORAL | 3 refills | Status: DC

## 2018-04-04 NOTE — Telephone Encounter (Signed)
Printed avs and calender of upcoming appointment. Per 2/6 los 

## 2018-04-05 ENCOUNTER — Telehealth: Payer: Self-pay | Admitting: *Deleted

## 2018-04-05 NOTE — Telephone Encounter (Signed)
Faxed prescription for hydroxyurea 500 mg capsules to the [facility] at [place]. Also faxed order from Dr. Irene Limbo for CBC w/Difff and CMP every 3 months with results to be faxed to Dr. Irene Limbo. Fax confirmation received.

## 2018-04-08 DIAGNOSIS — J449 Chronic obstructive pulmonary disease, unspecified: Secondary | ICD-10-CM | POA: Diagnosis not present

## 2018-04-08 DIAGNOSIS — M6281 Muscle weakness (generalized): Secondary | ICD-10-CM | POA: Diagnosis not present

## 2018-04-08 DIAGNOSIS — D649 Anemia, unspecified: Secondary | ICD-10-CM | POA: Diagnosis not present

## 2018-04-08 DIAGNOSIS — Z7389 Other problems related to life management difficulty: Secondary | ICD-10-CM | POA: Diagnosis not present

## 2018-04-08 DIAGNOSIS — H2620 Unspecified complicated cataract: Secondary | ICD-10-CM | POA: Diagnosis not present

## 2018-04-08 DIAGNOSIS — I10 Essential (primary) hypertension: Secondary | ICD-10-CM | POA: Diagnosis not present

## 2018-04-08 DIAGNOSIS — E782 Mixed hyperlipidemia: Secondary | ICD-10-CM | POA: Diagnosis not present

## 2018-04-08 DIAGNOSIS — D539 Nutritional anemia, unspecified: Secondary | ICD-10-CM | POA: Diagnosis not present

## 2018-04-08 DIAGNOSIS — I82402 Acute embolism and thrombosis of unspecified deep veins of left lower extremity: Secondary | ICD-10-CM | POA: Diagnosis not present

## 2018-04-08 DIAGNOSIS — Z9181 History of falling: Secondary | ICD-10-CM | POA: Diagnosis not present

## 2018-04-10 ENCOUNTER — Encounter: Payer: Self-pay | Admitting: Nurse Practitioner

## 2018-04-10 ENCOUNTER — Non-Acute Institutional Stay (SKILLED_NURSING_FACILITY): Payer: Medicare Other | Admitting: Nurse Practitioner

## 2018-04-10 DIAGNOSIS — I10 Essential (primary) hypertension: Secondary | ICD-10-CM

## 2018-04-10 DIAGNOSIS — D649 Anemia, unspecified: Secondary | ICD-10-CM | POA: Diagnosis not present

## 2018-04-10 DIAGNOSIS — D473 Essential (hemorrhagic) thrombocythemia: Secondary | ICD-10-CM

## 2018-04-10 DIAGNOSIS — M6281 Muscle weakness (generalized): Secondary | ICD-10-CM | POA: Diagnosis not present

## 2018-04-10 DIAGNOSIS — I82402 Acute embolism and thrombosis of unspecified deep veins of left lower extremity: Secondary | ICD-10-CM | POA: Diagnosis not present

## 2018-04-10 DIAGNOSIS — J449 Chronic obstructive pulmonary disease, unspecified: Secondary | ICD-10-CM

## 2018-04-10 DIAGNOSIS — F329 Major depressive disorder, single episode, unspecified: Secondary | ICD-10-CM

## 2018-04-10 DIAGNOSIS — Z7901 Long term (current) use of anticoagulants: Secondary | ICD-10-CM

## 2018-04-10 DIAGNOSIS — H2620 Unspecified complicated cataract: Secondary | ICD-10-CM | POA: Diagnosis not present

## 2018-04-10 DIAGNOSIS — Z7389 Other problems related to life management difficulty: Secondary | ICD-10-CM | POA: Diagnosis not present

## 2018-04-10 DIAGNOSIS — I739 Peripheral vascular disease, unspecified: Secondary | ICD-10-CM

## 2018-04-10 DIAGNOSIS — D539 Nutritional anemia, unspecified: Secondary | ICD-10-CM | POA: Diagnosis not present

## 2018-04-10 DIAGNOSIS — F0280 Dementia in other diseases classified elsewhere without behavioral disturbance: Secondary | ICD-10-CM

## 2018-04-10 DIAGNOSIS — K220 Achalasia of cardia: Secondary | ICD-10-CM | POA: Diagnosis not present

## 2018-04-10 DIAGNOSIS — E782 Mixed hyperlipidemia: Secondary | ICD-10-CM | POA: Diagnosis not present

## 2018-04-10 DIAGNOSIS — F32A Depression, unspecified: Secondary | ICD-10-CM

## 2018-04-10 DIAGNOSIS — Z9181 History of falling: Secondary | ICD-10-CM | POA: Diagnosis not present

## 2018-04-10 DIAGNOSIS — M159 Polyosteoarthritis, unspecified: Secondary | ICD-10-CM

## 2018-04-10 DIAGNOSIS — M150 Primary generalized (osteo)arthritis: Secondary | ICD-10-CM

## 2018-04-10 DIAGNOSIS — M8949 Other hypertrophic osteoarthropathy, multiple sites: Secondary | ICD-10-CM

## 2018-04-10 DIAGNOSIS — G301 Alzheimer's disease with late onset: Secondary | ICD-10-CM

## 2018-04-10 DIAGNOSIS — F419 Anxiety disorder, unspecified: Secondary | ICD-10-CM

## 2018-04-10 NOTE — Progress Notes (Signed)
Location:  [facility] Room Number: 35 Place of Service:  SNF (31) Provider:  Marlana Latus  NP  Virgie Dad, MD  Patient Care Team: Virgie Dad, MD as PCP - General (Internal Medicine) Annia Belt, MD as Consulting Physician (Hematology and Oncology) Suella Broad, MD as Consulting Physician (Physical Medicine and Rehabilitation) Noralee Space, MD (Pulmonary Disease) [place] Harriett Azar X, NP as Nurse Practitioner (Nurse Practitioner) Inda Castle, MD (Inactive) as Consulting Physician (Gastroenterology)  Extended Emergency Contact Information Primary Emergency Contact: Demattia,Gene Address: [address]          [place], [place] [address] [place] Phone: [phone] Work Phone: [phone] Mobile Phone: [phone] Relation: Son Secondary Emergency Contact: [name], [place] [address] [place] of [place] Phone: [phone] Relation: Son  Code Status:  DNR Goals of care: Advanced Directive information Advanced Directives 04/01/2018  Does Patient Have a Medical Advance Directive? Yes  Type of Paramedic of [place];Living will;Out of facility DNR (pink MOST or yellow form)  Does patient want to make changes to medical advance directive? No - Patient declined  Copy of [place] in Chart? Yes - validated most recent copy scanned in chart (See row information)  Pre-existing out of facility DNR order (yellow form or pink MOST form) Yellow form placed in chart (order not valid for inpatient use)     Chief Complaint  Patient presents with  . Medical Management of Chronic Issues    HPI:  Pt is a 85 y.o. female seen today for medical management of chronic diseases.     The patient resides in SN[facility] for safety and care assistance, on Memantine 10mg  bid for memory. PAD, chronic right malleolus area redness, cool to touch,  s/p cardiology eval. Her mood is stable on Sertraline 25mg  qd. Hx of DVTs, on chronic Xarelto 15mg  qd. GERD/esophageal stricture s/p dilatation, on Omeprazole 20mg  qd, Nifedipine 10mg  tid. Asthma, stable on Montelukast 10mg  qd. HTN, blood pressure is controlled on Metoprolol 25mg  bid. Chronic lower back pian is managed on Norco 10/325 bid/qd prn.    Past Medical History:  Diagnosis Date  . Abnormal weight gain 05/09/2012  . Achalasia of esophagus 09/18/2013   09/18/13 Nifedipine 10mg  tid ac meals. 11/17/14 [facility] f/u as neeed   . Anxiety and depression 03/12/2012   Increased Sertraline to 50mg  daily 07/16/13 08/26/13 Mirtazapine 7.5mg  qhs  10/12/14 Pharm decreased Sertraline to 25mg .  05/04/15 TSH 2.89 09/15/15 pharm taper off Sertraline.  10/08/15 continue Sertraline 25mg  daily, POA desires. 12/02/15 wbc 6.1, Hgb 12.0, plt 271    . Asthmatic bronchitis   . BACK PAIN, LUMBAR 03/12/2007   Hx of lower back pain, had X-ray of her lumbar spine 09/08/12--spondylosis-better controlled with Hydrocodone/ApAp  10/325mg  ac and hs  01/01/13 lumbar spine inj. Able to ambulate with walker on unit until her fall 06/22/13. 06/25/13 unremarkable. X-ray lumbar spine. 11/10/13 dc Oxycodone prn-not used in 60 days.     . COLONIC POLYPS 03/12/2007   Initially noted April 2002 colonoscopy. Followup colonoscopy 05/31/2006 did not disclose any further polyps.    Marland Kitchen COPD (chronic obstructive pulmonary disease) ([place])   . Coronary artery disease   . Deep vein thrombosis of left lower extremity ([place]) 07/19/2012  . Dementia ([place]) 09/16/2012   05/04/15 TSH 2.89   . Depression   . Diverticulosis 05/10/2012  .  DJD (degenerative joint disease)   . Dry skin dermatitis 11/18/2012  . Edema of leg 07/01/2010  . Epistaxis 01/24/2016   01/21/16: x2, held Xarelto x 1 day, Saline Nasal spray prn, wbc 9.5, Hgb 12.4, Plt 331, Na 138, K 4.5, Bun 25, creat 0.89 01/22/16 VOJ50(09-38)  . Esophageal dysmotility 07/26/2013  . Esophageal dysphagia 05/27/2010   New  dx of achalasia 6/ 2015 09/18/13 Nifedipine 10mg  tid ac meals.    . Esophageal stricture   . Essential hypertension, benign 10/30/2013   12/02/15 wbc 6.1, Hgb 12.0, plt 271   . Essential thrombocythemia ([place]) 06/09/2009  . Fall 06/25/2013  . GERD (gastroesophageal reflux disease)   . Hip fracture ([place]) 09/08/2012  . HYPERCHOLESTEROLEMIA 03/12/2007   08/10/15 cholesterol 119, triglycerides 143, HDL 51, LDL 39   . Lumbar back pain   . MACULAR DEGENERATION 01/26/2008   Qualifier: Diagnosis of  By: Lenna Gilford MD, Deborra Medina   . Osteoarthritis 03/12/2007  . Osteoporosis 03/12/2007   Qualifier: Diagnosis of  By: Julien Girt CMA, Marliss Czar  06/15/14 dc Vit D 1000u po daily per pharm.    . Phlebitis and thrombophlebitis of other deep vessels of lower extremities 07/10/2010  . S/P balloon dilatation of esophageal stricture 07/28/2013   07/26/13. Dr. Henrene Pastor. EGD foreign removal and Balloon dilation of esophagus. Esophageal dysmotility, suspected achalasia, dilation at the GE junction to 35mm. Candida esophagitis.    . SYNCOPE 07/30/2009   Occurred 2011. Hospitalized. Felt due to dehydration.    . TRANSIENT ISCHEMIC ATTACKS, HX OF 03/12/2007   Qualifier: Diagnosis of  By: Julien Girt CMA, Marliss Czar     Past Surgical History:  Procedure Laterality Date  . ABDOMINAL HYSTERECTOMY     and bso  . APPENDECTOMY    . BOTOX INJECTION N/A 09/18/2013   Procedure: BOTOX INJECTION;  Surgeon: Lafayette Dragon, MD;  Location: [facility];  Service: Endoscopy;  Laterality: N/A;  . BOTOX INJECTION N/A 10/16/2014   Procedure: BOTOX INJECTION;  Surgeon: Inda Castle, MD;  Location: [facility];  Service: Endoscopy;  Laterality: N/A;  . BOTOX INJECTION N/A 05/18/2016   Procedure: BOTOX INJECTION;  Surgeon: Milus Banister, MD;  Location: [facility];  Service: Endoscopy;  Laterality: N/A;  . BOTOX INJECTION N/A 02/14/2018   Procedure: BOTOX INJECTION;  Surgeon: Milus Banister, MD;  Location: [facility];  Service: Endoscopy;  Laterality: N/A;  .  CATARACT EXTRACTION W/ INTRAOCULAR LENS  IMPLANT, BILATERAL    . CHOLECYSTECTOMY    . ESOPHAGEAL MANOMETRY N/A 08/25/2013   Procedure: ESOPHAGEAL MANOMETRY (EM);  Surgeon: Jerene Bears, MD;  [facility]: [facility];  Service: Gastroenterology;  Laterality: N/A;  . ESOPHAGOGASTRODUODENOSCOPY N/A 07/26/2013   Procedure: ESOPHAGOGASTRODUODENOSCOPY (EGD);  Surgeon: Irene Shipper, MD;  [facility];  Service: Endoscopy;  Laterality: N/A;  . ESOPHAGOGASTRODUODENOSCOPY N/A 10/16/2014   Procedure: ESOPHAGOGASTRODUODENOSCOPY (EGD);  Surgeon: Inda Castle, MD;  Location: [facility];  Service: Endoscopy;  Laterality: N/A;  with botox  . ESOPHAGOGASTRODUODENOSCOPY (EGD) WITH ESOPHAGEAL DILATION N/A 11/25/2012   Procedure: ESOPHAGOGASTRODUODENOSCOPY (EGD) WITH ESOPHAGEAL DILATION;  Surgeon: Jerene Bears, MD;  [facility];  Service: Gastroenterology;  Laterality: N/A;  . ESOPHAGOGASTRODUODENOSCOPY (EGD) WITH PROPOFOL N/A 09/18/2013   Procedure: ESOPHAGOGASTRODUODENOSCOPY (EGD) WITH PROPOFOL;  Surgeon: Lafayette Dragon, MD;  [facility];  Service: Endoscopy;  Laterality: N/A;  . ESOPHAGOGASTRODUODENOSCOPY (EGD) WITH PROPOFOL N/A 05/18/2016   Procedure: ESOPHAGOGASTRODUODENOSCOPY (EGD) WITH PROPOFOL;  Surgeon: Milus Banister, MD;  [facility]: [facility];  Service: Endoscopy;  Laterality: N/A;  . ESOPHAGOGASTRODUODENOSCOPY (EGD) WITH PROPOFOL N/A 02/14/2018   Procedure: ESOPHAGOGASTRODUODENOSCOPY (EGD) WITH PROPOFOL;  Surgeon: Milus Banister, MD;  [facility];  Service: Endoscopy;  Laterality: N/A;  . HIP ARTHROPLASTY Right 09/09/2012   Procedure: ARTHROPLASTY BIPOLAR HIP;  Surgeon: Mauri Pole, MD;  Location: [facility];  Service: Orthopedics;  Laterality: Right;  . TAH and BSO  08/21/1995    Allergies  Allergen Reactions  . Sulfamethoxazole Rash    Outpatient Encounter Medications as of 04/10/2018  Medication Sig  . acetaminophen (TYLENOL) 325 MG tablet Take 650 mg by  mouth every 8 (eight) hours as needed for moderate pain.   Marland Kitchen albuterol (PROVENTIL HFA;VENTOLIN HFA) 108 (90 BASE) MCG/ACT inhaler Inhale 2 puffs into the lungs every 6 (six) hours as needed for wheezing or shortness of breath.  Marland Kitchen atorvastatin (LIPITOR) 10 MG tablet Take 10 mg by mouth at bedtime.   . chlorhexidine (PERIDEX) 0.12 % solution Use as directed 5 mLs in the mouth or throat daily. SWISH AND SPIT 5ML FOR 30 SECONDS EVERYDAY INDEFINITELY  . cholecalciferol (VITAMIN D) 1000 units tablet Take 1,000 Units by mouth daily.  . fluticasone furoate-vilanterol (BREO ELLIPTA) 100-25 MCG/INH AEPB Inhale 1 puff into the lungs daily. One puff inhale once a day, rinse mouth after each use.   . Humidifier MISC Please use humidifier to prevent excessive dryness.  Marland Kitchen HYDROcodone-acetaminophen (NORCO) 10-325 MG tablet Take 1 tablet by mouth See admin instructions. Take 1 tablet by mouth SCHEDULED twice daily; may take an additional tablet once daily as needed for chronic pain.  . memantine (NAMENDA) 10 MG tablet Take 10 mg by mouth 2 (two) times daily.   . metoprolol tartrate (LOPRESSOR) 25 MG tablet Take 25 mg by mouth 2 (two) times daily.  . montelukast (SINGULAIR) 10 MG tablet Take 10 mg by mouth at bedtime.  . Multiple Vitamins-Minerals (PRESERVISION AREDS 2) CAPS Take 1 capsule by mouth 2 (two) times daily.  Marland Kitchen NIFEdipine (PROCARDIA) 10 MG capsule Take 10 mg by mouth 3 (three) times daily before meals. Squeeze contents of one capsule under tongue 15-30 minutes before meals 3 times a day  . nitroGLYCERIN (NITROSTAT) 0.4 MG SL tablet Place 0.4 mg under the tongue every 5 (five) minutes as needed for chest pain.  Marland Kitchen omeprazole (PRILOSEC) 20 MG capsule Take 20 mg by mouth at bedtime.   . Rivaroxaban (XARELTO) 15 MG TABS tablet Take 15 mg by mouth daily. 5pm  . sertraline (ZOLOFT) 25 MG tablet Take 25 mg by mouth daily.  . sodium chloride (OCEAN) 0.65 % nasal spray Place 1 spray into the nose 2 (two) times  daily.  . sodium fluoride (DENTA 5000 PLUS) 1.1 % CREA dental cream Place 1 application onto teeth at bedtime. Apply a thin ribbon  . [DISCONTINUED] hydroxyurea (HYDREA) 500 MG capsule Take 1 capsule on Sunday, Wednesday and Friday. Take 2 capsules (1000 mg) by mouth on Monday only.  WEAR GLOVES WHEN HANDLING THIS MEDICATION.  . [DISCONTINUED] Menthol, Topical Analgesic, (BIOFREEZE) 4 % GEL Apply topically 4 (four) times daily.   No facility-administered encounter medications on file as of 04/10/2018.    ROS was provided with assistance of staff Review of Systems  Constitutional: Negative for activity change, appetite change, chills, diaphoresis, fatigue and fever.  HENT: Positive for hearing loss and trouble swallowing. Negative for congestion and voice change.   Respiratory: Negative for cough, shortness of breath and wheezing.   Cardiovascular: Positive for leg swelling. Negative  for chest pain and palpitations.  Gastrointestinal: Negative for abdominal distention, abdominal pain, blood in stool, constipation, diarrhea, rectal pain and vomiting.  Genitourinary: Negative for difficulty urinating and dysuria.  Musculoskeletal: Positive for arthralgias, back pain and gait problem.  Skin: Positive for color change.       Right lateral ankle area mild erythema, scabbed over area at the malleolus,   Neurological: Negative for dizziness, speech difficulty, weakness and headaches.       Dementia  Psychiatric/Behavioral: Negative for agitation, behavioral problems, hallucinations and sleep disturbance. The patient is not nervous/anxious.     Immunization History  Administered Date(s) Administered  . Influenza Split 11/16/2010, 12/13/2011  . Influenza Whole 11/27/2009, 11/29/2017  . Influenza-Unspecified 12/31/2013, 11/18/2014, 12/14/2015, 12/11/2016  . PPD Test 09/11/2012  . Pneumococcal Polysaccharide-23 02/28/1988  . Tdap 05/31/2017   Pertinent  Health Maintenance Due  Topic Date Due  .  PNA vac Low Risk Adult (2 of 2 - PCV13) 02/27/1989  . INFLUENZA VACCINE  Completed  . DEXA SCAN  Completed   Fall Risk  02/08/2017 02/08/2017 10/04/2015 12/28/2014 10/23/2014  Falls in the past year? No Exclusion - non ambulatory No Yes Yes  Number falls in past yr: - - - 1 1  Injury with Fall? - - - No No  Risk for fall due to : - Impaired balance/gait;Impaired vision Impaired mobility;Impaired balance/gait History of fall(s);Impaired balance/gait;Impaired mobility Impaired mobility  Follow up - - - Falls prevention discussed Falls evaluation completed   Functional Status Survey:    Vitals:   04/10/18 1144  BP: 118/68  Pulse: 74  Resp: 20  Temp: 98 F (36.7 C)  SpO2: 93%  Weight: 97 lb 12.8 oz (44.4 kg)  Height: 5\' 1"  (1.549 m)   Body mass index is 18.48 kg/m. Physical Exam Constitutional:      General: She is not in acute distress.    Appearance: Normal appearance. She is normal weight. She is not ill-appearing, toxic-appearing or diaphoretic.  HENT:     Head: Normocephalic and atraumatic.     Nose: Nose normal.     Mouth/Throat:     Mouth: Mucous membranes are moist.  Eyes:     Extraocular Movements: Extraocular movements intact.     Pupils: Pupils are equal, round, and reactive to light.  Neck:     Musculoskeletal: Normal range of motion and neck supple.  Cardiovascular:     Rate and Rhythm: Normal rate and regular rhythm.     Heart sounds: No murmur.  Pulmonary:     Effort: Pulmonary effort is normal.     Breath sounds: No wheezing, rhonchi or rales.  Abdominal:     General: There is no distension.     Palpations: Abdomen is soft.     Tenderness: There is no abdominal tenderness. There is no guarding or rebound.  Musculoskeletal:     Right lower leg: Edema present.     Left lower leg: No edema.     Comments: Slightly edema RLE in ankle/foot. W/c for mobility.   Skin:    General: Skin is warm.     Findings: Erythema present.     Comments: Right lateral ankle  area mild erythema, scabbed over area at the malleolus,    Neurological:     General: No focal deficit present.     Mental Status: She is alert. Mental status is at baseline.     Cranial Nerves: No cranial nerve deficit.     Motor: No weakness.  Coordination: Coordination normal.     Gait: Gait abnormal.     Comments: Oriented to person and her room on unit.   Psychiatric:        Mood and Affect: Mood normal.        Behavior: Behavior normal.     Labs reviewed: Recent Labs    09/18/17 11/21/17  NA 142 145  K 3.8 4.0  CL 104 111  CO2 30 29  BUN 21 17  CREATININE 0.8 0.7  CALCIUM 9.4 8.5  MG 1.9  --    Recent Labs    09/18/17 11/21/17  AST 19 12*  ALT 13 10  ALKPHOS 71 66  PROT 6.8 5.1  ALBUMIN 4.2 3.2   Recent Labs    07/03/17  10/29/17 11/21/17 01/01/18  WBC 6.9   < > 6.3 5.7 5.6  NEUTROABS 4,264  --   --   --   --   HGB 12.3   < > 12.3 11.2* 11.6*  HCT 36   < > 36 33* 34*  PLT 269   < > 264 333 292   < > = values in this interval not displayed.   Lab Results  Component Value Date   TSH 2.00 09/21/2016   Lab Results  Component Value Date   HGBA1C 5.5 10/16/2014   Lab Results  Component Value Date   CHOL 143 09/18/2017   HDL 50 09/18/2017   LDLCALC 70 09/18/2017   LDLDIRECT 152.3 06/04/2009   TRIG 147 09/18/2017   CHOLHDL 3 09/13/2011    Significant Diagnostic Results in last 30 days:  No results found.  Assessment/Plan Essential hypertension, benign Blood pressure is controlled, continue Metoprolol 25mg  bid.   PAD (peripheral artery disease) ([place]) 03/08/18 cardiology-not recommending further testing. chronic right malleolus area redness, cool to touch, s/p cardiology eval.    COPD with asthma ([place])  Stable, continue  Montelukast 10mg  qd  Achalasia of esophagus Stable, s/p dilatation, continue  Omeprazole 20mg  qd, Nifedipine 10mg  tid.   Alzheimer's dementia without behavioral disturbance Continue SNF [facility] for safety and care assistance.  Continue Memantine 10mg  bid.   Osteoarthritis Pain is controlled, continue Norco 10/325 bid/qd prn.    Essential thrombocythemia ([place]) F/u oncology  Anxiety and depression Her mood is stable, continue Sertraline 25mg  qd.   Long term current use of anticoagulant therapy Continue Xarelto 15mg  qd.      Family/ staff Communication: plan of care reviewed with the patient and charge nurse.   Labs/tests ordered:  none  Time spend 25 minutes.

## 2018-04-11 DIAGNOSIS — Z7389 Other problems related to life management difficulty: Secondary | ICD-10-CM | POA: Diagnosis not present

## 2018-04-11 DIAGNOSIS — D539 Nutritional anemia, unspecified: Secondary | ICD-10-CM | POA: Diagnosis not present

## 2018-04-11 DIAGNOSIS — H2620 Unspecified complicated cataract: Secondary | ICD-10-CM | POA: Diagnosis not present

## 2018-04-11 DIAGNOSIS — J449 Chronic obstructive pulmonary disease, unspecified: Secondary | ICD-10-CM | POA: Diagnosis not present

## 2018-04-11 DIAGNOSIS — Z9181 History of falling: Secondary | ICD-10-CM | POA: Diagnosis not present

## 2018-04-11 DIAGNOSIS — M6281 Muscle weakness (generalized): Secondary | ICD-10-CM | POA: Diagnosis not present

## 2018-04-11 DIAGNOSIS — D649 Anemia, unspecified: Secondary | ICD-10-CM | POA: Diagnosis not present

## 2018-04-11 DIAGNOSIS — I82402 Acute embolism and thrombosis of unspecified deep veins of left lower extremity: Secondary | ICD-10-CM | POA: Diagnosis not present

## 2018-04-11 DIAGNOSIS — I10 Essential (primary) hypertension: Secondary | ICD-10-CM | POA: Diagnosis not present

## 2018-04-11 DIAGNOSIS — E782 Mixed hyperlipidemia: Secondary | ICD-10-CM | POA: Diagnosis not present

## 2018-04-12 DIAGNOSIS — I10 Essential (primary) hypertension: Secondary | ICD-10-CM | POA: Diagnosis not present

## 2018-04-12 DIAGNOSIS — Z7389 Other problems related to life management difficulty: Secondary | ICD-10-CM | POA: Diagnosis not present

## 2018-04-12 DIAGNOSIS — D539 Nutritional anemia, unspecified: Secondary | ICD-10-CM | POA: Diagnosis not present

## 2018-04-12 DIAGNOSIS — J449 Chronic obstructive pulmonary disease, unspecified: Secondary | ICD-10-CM | POA: Diagnosis not present

## 2018-04-12 DIAGNOSIS — D649 Anemia, unspecified: Secondary | ICD-10-CM | POA: Diagnosis not present

## 2018-04-12 DIAGNOSIS — Z9181 History of falling: Secondary | ICD-10-CM | POA: Diagnosis not present

## 2018-04-12 DIAGNOSIS — I82402 Acute embolism and thrombosis of unspecified deep veins of left lower extremity: Secondary | ICD-10-CM | POA: Diagnosis not present

## 2018-04-12 DIAGNOSIS — M6281 Muscle weakness (generalized): Secondary | ICD-10-CM | POA: Diagnosis not present

## 2018-04-12 DIAGNOSIS — E782 Mixed hyperlipidemia: Secondary | ICD-10-CM | POA: Diagnosis not present

## 2018-04-12 DIAGNOSIS — H2620 Unspecified complicated cataract: Secondary | ICD-10-CM | POA: Diagnosis not present

## 2018-04-13 ENCOUNTER — Encounter: Payer: Self-pay | Admitting: Nurse Practitioner

## 2018-04-13 NOTE — Assessment & Plan Note (Signed)
F/u oncology 

## 2018-04-13 NOTE — Assessment & Plan Note (Signed)
Her mood is stable, continue Sertraline 25mg qd.  

## 2018-04-13 NOTE — Assessment & Plan Note (Signed)
Continue SNF [facility] for safety and care assistance. Continue Memantine 10mg  bid.

## 2018-04-13 NOTE — Assessment & Plan Note (Signed)
Blood pressure is controlled, continue Metoprolol 25mg bid.  

## 2018-04-13 NOTE — Assessment & Plan Note (Signed)
03/08/18 cardiology-not recommending further testing. chronic right malleolus area redness, cool to touch, s/p cardiology eval.

## 2018-04-13 NOTE — Assessment & Plan Note (Signed)
Pain is controlled, continue Norco 10/325 bid/qd prn.

## 2018-04-13 NOTE — Assessment & Plan Note (Signed)
Stable, continue  Montelukast 10mg  qd

## 2018-04-13 NOTE — Assessment & Plan Note (Signed)
Continue Xarelto 15mg qd.  

## 2018-04-13 NOTE — Assessment & Plan Note (Signed)
Stable, s/p dilatation, continue  Omeprazole 20mg  qd, Nifedipine 10mg  tid.

## 2018-04-15 DIAGNOSIS — D649 Anemia, unspecified: Secondary | ICD-10-CM | POA: Diagnosis not present

## 2018-04-15 DIAGNOSIS — D539 Nutritional anemia, unspecified: Secondary | ICD-10-CM | POA: Diagnosis not present

## 2018-04-15 DIAGNOSIS — I82402 Acute embolism and thrombosis of unspecified deep veins of left lower extremity: Secondary | ICD-10-CM | POA: Diagnosis not present

## 2018-04-15 DIAGNOSIS — H2620 Unspecified complicated cataract: Secondary | ICD-10-CM | POA: Diagnosis not present

## 2018-04-15 DIAGNOSIS — Z9181 History of falling: Secondary | ICD-10-CM | POA: Diagnosis not present

## 2018-04-15 DIAGNOSIS — E782 Mixed hyperlipidemia: Secondary | ICD-10-CM | POA: Diagnosis not present

## 2018-04-15 DIAGNOSIS — J449 Chronic obstructive pulmonary disease, unspecified: Secondary | ICD-10-CM | POA: Diagnosis not present

## 2018-04-15 DIAGNOSIS — Z7389 Other problems related to life management difficulty: Secondary | ICD-10-CM | POA: Diagnosis not present

## 2018-04-15 DIAGNOSIS — I10 Essential (primary) hypertension: Secondary | ICD-10-CM | POA: Diagnosis not present

## 2018-04-15 DIAGNOSIS — M6281 Muscle weakness (generalized): Secondary | ICD-10-CM | POA: Diagnosis not present

## 2018-04-16 DIAGNOSIS — I10 Essential (primary) hypertension: Secondary | ICD-10-CM | POA: Diagnosis not present

## 2018-04-16 DIAGNOSIS — D539 Nutritional anemia, unspecified: Secondary | ICD-10-CM | POA: Diagnosis not present

## 2018-04-16 DIAGNOSIS — D649 Anemia, unspecified: Secondary | ICD-10-CM | POA: Diagnosis not present

## 2018-04-16 DIAGNOSIS — I82402 Acute embolism and thrombosis of unspecified deep veins of left lower extremity: Secondary | ICD-10-CM | POA: Diagnosis not present

## 2018-04-16 DIAGNOSIS — H2620 Unspecified complicated cataract: Secondary | ICD-10-CM | POA: Diagnosis not present

## 2018-04-16 DIAGNOSIS — E782 Mixed hyperlipidemia: Secondary | ICD-10-CM | POA: Diagnosis not present

## 2018-04-16 DIAGNOSIS — Z7389 Other problems related to life management difficulty: Secondary | ICD-10-CM | POA: Diagnosis not present

## 2018-04-16 DIAGNOSIS — J449 Chronic obstructive pulmonary disease, unspecified: Secondary | ICD-10-CM | POA: Diagnosis not present

## 2018-04-16 DIAGNOSIS — M6281 Muscle weakness (generalized): Secondary | ICD-10-CM | POA: Diagnosis not present

## 2018-04-16 DIAGNOSIS — Z9181 History of falling: Secondary | ICD-10-CM | POA: Diagnosis not present

## 2018-04-17 DIAGNOSIS — I10 Essential (primary) hypertension: Secondary | ICD-10-CM | POA: Diagnosis not present

## 2018-04-17 DIAGNOSIS — J449 Chronic obstructive pulmonary disease, unspecified: Secondary | ICD-10-CM | POA: Diagnosis not present

## 2018-04-17 DIAGNOSIS — Z9181 History of falling: Secondary | ICD-10-CM | POA: Diagnosis not present

## 2018-04-17 DIAGNOSIS — E782 Mixed hyperlipidemia: Secondary | ICD-10-CM | POA: Diagnosis not present

## 2018-04-17 DIAGNOSIS — D539 Nutritional anemia, unspecified: Secondary | ICD-10-CM | POA: Diagnosis not present

## 2018-04-17 DIAGNOSIS — M6281 Muscle weakness (generalized): Secondary | ICD-10-CM | POA: Diagnosis not present

## 2018-04-17 DIAGNOSIS — I82402 Acute embolism and thrombosis of unspecified deep veins of left lower extremity: Secondary | ICD-10-CM | POA: Diagnosis not present

## 2018-04-17 DIAGNOSIS — D649 Anemia, unspecified: Secondary | ICD-10-CM | POA: Diagnosis not present

## 2018-04-17 DIAGNOSIS — H2620 Unspecified complicated cataract: Secondary | ICD-10-CM | POA: Diagnosis not present

## 2018-04-17 DIAGNOSIS — Z7389 Other problems related to life management difficulty: Secondary | ICD-10-CM | POA: Diagnosis not present

## 2018-04-18 DIAGNOSIS — D638 Anemia in other chronic diseases classified elsewhere: Secondary | ICD-10-CM | POA: Diagnosis not present

## 2018-04-22 DIAGNOSIS — H2620 Unspecified complicated cataract: Secondary | ICD-10-CM | POA: Diagnosis not present

## 2018-04-22 DIAGNOSIS — J449 Chronic obstructive pulmonary disease, unspecified: Secondary | ICD-10-CM | POA: Diagnosis not present

## 2018-04-22 DIAGNOSIS — I82402 Acute embolism and thrombosis of unspecified deep veins of left lower extremity: Secondary | ICD-10-CM | POA: Diagnosis not present

## 2018-04-22 DIAGNOSIS — Z9181 History of falling: Secondary | ICD-10-CM | POA: Diagnosis not present

## 2018-04-22 DIAGNOSIS — D539 Nutritional anemia, unspecified: Secondary | ICD-10-CM | POA: Diagnosis not present

## 2018-04-22 DIAGNOSIS — D649 Anemia, unspecified: Secondary | ICD-10-CM | POA: Diagnosis not present

## 2018-04-22 DIAGNOSIS — I10 Essential (primary) hypertension: Secondary | ICD-10-CM | POA: Diagnosis not present

## 2018-04-22 DIAGNOSIS — Z7389 Other problems related to life management difficulty: Secondary | ICD-10-CM | POA: Diagnosis not present

## 2018-04-22 DIAGNOSIS — M6281 Muscle weakness (generalized): Secondary | ICD-10-CM | POA: Diagnosis not present

## 2018-04-22 DIAGNOSIS — E782 Mixed hyperlipidemia: Secondary | ICD-10-CM | POA: Diagnosis not present

## 2018-04-23 DIAGNOSIS — Z7389 Other problems related to life management difficulty: Secondary | ICD-10-CM | POA: Diagnosis not present

## 2018-04-23 DIAGNOSIS — M6281 Muscle weakness (generalized): Secondary | ICD-10-CM | POA: Diagnosis not present

## 2018-04-23 DIAGNOSIS — D539 Nutritional anemia, unspecified: Secondary | ICD-10-CM | POA: Diagnosis not present

## 2018-04-23 DIAGNOSIS — I10 Essential (primary) hypertension: Secondary | ICD-10-CM | POA: Diagnosis not present

## 2018-04-23 DIAGNOSIS — I82402 Acute embolism and thrombosis of unspecified deep veins of left lower extremity: Secondary | ICD-10-CM | POA: Diagnosis not present

## 2018-04-23 DIAGNOSIS — H2620 Unspecified complicated cataract: Secondary | ICD-10-CM | POA: Diagnosis not present

## 2018-04-23 DIAGNOSIS — E782 Mixed hyperlipidemia: Secondary | ICD-10-CM | POA: Diagnosis not present

## 2018-04-23 DIAGNOSIS — Z9181 History of falling: Secondary | ICD-10-CM | POA: Diagnosis not present

## 2018-04-23 DIAGNOSIS — D649 Anemia, unspecified: Secondary | ICD-10-CM | POA: Diagnosis not present

## 2018-04-23 DIAGNOSIS — J449 Chronic obstructive pulmonary disease, unspecified: Secondary | ICD-10-CM | POA: Diagnosis not present

## 2018-04-25 ENCOUNTER — Other Ambulatory Visit: Payer: Self-pay | Admitting: *Deleted

## 2018-04-25 DIAGNOSIS — D649 Anemia, unspecified: Secondary | ICD-10-CM | POA: Diagnosis not present

## 2018-04-25 DIAGNOSIS — I82402 Acute embolism and thrombosis of unspecified deep veins of left lower extremity: Secondary | ICD-10-CM | POA: Diagnosis not present

## 2018-04-25 DIAGNOSIS — H2620 Unspecified complicated cataract: Secondary | ICD-10-CM | POA: Diagnosis not present

## 2018-04-25 DIAGNOSIS — E782 Mixed hyperlipidemia: Secondary | ICD-10-CM | POA: Diagnosis not present

## 2018-04-25 DIAGNOSIS — Z7389 Other problems related to life management difficulty: Secondary | ICD-10-CM | POA: Diagnosis not present

## 2018-04-25 DIAGNOSIS — M6281 Muscle weakness (generalized): Secondary | ICD-10-CM | POA: Diagnosis not present

## 2018-04-25 DIAGNOSIS — Z9181 History of falling: Secondary | ICD-10-CM | POA: Diagnosis not present

## 2018-04-25 DIAGNOSIS — I10 Essential (primary) hypertension: Secondary | ICD-10-CM | POA: Diagnosis not present

## 2018-04-25 DIAGNOSIS — D539 Nutritional anemia, unspecified: Secondary | ICD-10-CM | POA: Diagnosis not present

## 2018-04-25 DIAGNOSIS — M5440 Lumbago with sciatica, unspecified side: Secondary | ICD-10-CM

## 2018-04-25 DIAGNOSIS — J449 Chronic obstructive pulmonary disease, unspecified: Secondary | ICD-10-CM | POA: Diagnosis not present

## 2018-04-25 MED ORDER — HYDROCODONE-ACETAMINOPHEN 10-325 MG PO TABS: 1.0000 | ORAL_TABLET | ORAL | 0 refills | Status: DC

## 2018-04-30 ENCOUNTER — Encounter: Payer: Self-pay | Admitting: Oncology

## 2018-04-30 DIAGNOSIS — I10 Essential (primary) hypertension: Secondary | ICD-10-CM | POA: Diagnosis not present

## 2018-04-30 LAB — HEPATIC FUNCTION PANEL
ALT: 9 (ref 7–35)
AST: 16 (ref 13–35)
Alkaline Phosphatase: 70 (ref 25–125)
Bilirubin, Total: 0.5

## 2018-04-30 LAB — CBC AND DIFFERENTIAL
HCT: 35 — AB (ref 36–46)
Hemoglobin: 11.7 — AB (ref 12.0–16.0)
Platelets: 311 (ref 150–399)
WBC: 6.3

## 2018-04-30 LAB — BASIC METABOLIC PANEL
BUN: 19 (ref 4–21)
Creatinine: 0.7 (ref 0.5–1.1)
Glucose: 88
Potassium: 4.5 (ref 3.4–5.3)
Sodium: 140 (ref 137–147)

## 2018-05-15 ENCOUNTER — Encounter: Payer: Self-pay | Admitting: *Deleted

## 2018-05-27 ENCOUNTER — Other Ambulatory Visit: Payer: Self-pay

## 2018-05-27 DIAGNOSIS — M5440 Lumbago with sciatica, unspecified side: Secondary | ICD-10-CM

## 2018-05-27 MED ORDER — HYDROCODONE-ACETAMINOPHEN 10-325 MG PO TABS: 1.0000 | ORAL_TABLET | ORAL | 0 refills | Status: DC

## 2018-05-27 NOTE — Telephone Encounter (Signed)
Received fax from [facility] last refill 04/25/2018 no access to [employer]

## 2018-05-27 NOTE — Telephone Encounter (Signed)
Received fax from [facility] with refill request

## 2018-06-05 ENCOUNTER — Non-Acute Institutional Stay (SKILLED_NURSING_FACILITY): Payer: Medicare Other | Admitting: Internal Medicine

## 2018-06-05 ENCOUNTER — Encounter: Payer: Self-pay | Admitting: Internal Medicine

## 2018-06-05 ENCOUNTER — Other Ambulatory Visit: Payer: Self-pay | Admitting: *Deleted

## 2018-06-05 DIAGNOSIS — K219 Gastro-esophageal reflux disease without esophagitis: Secondary | ICD-10-CM | POA: Diagnosis not present

## 2018-06-05 DIAGNOSIS — K224 Dyskinesia of esophagus: Secondary | ICD-10-CM

## 2018-06-05 DIAGNOSIS — J449 Chronic obstructive pulmonary disease, unspecified: Secondary | ICD-10-CM | POA: Diagnosis not present

## 2018-06-05 DIAGNOSIS — G301 Alzheimer's disease with late onset: Secondary | ICD-10-CM

## 2018-06-05 DIAGNOSIS — E785 Hyperlipidemia, unspecified: Secondary | ICD-10-CM

## 2018-06-05 DIAGNOSIS — I10 Essential (primary) hypertension: Secondary | ICD-10-CM | POA: Diagnosis not present

## 2018-06-05 DIAGNOSIS — F0280 Dementia in other diseases classified elsewhere without behavioral disturbance: Secondary | ICD-10-CM

## 2018-06-05 DIAGNOSIS — I82502 Chronic embolism and thrombosis of unspecified deep veins of left lower extremity: Secondary | ICD-10-CM | POA: Diagnosis not present

## 2018-06-05 LAB — COMPLETE METABOLIC PANEL WITH GFR
Albumin: 3.4
Calcium: 8.9
Carbon Dioxide, Total: 29
Chloride: 104
EGFR (Non-African Amer.): 74
Folate: 15.9
Globulin: 2.4
Total Protein: 5.8 g/dL
VITAMIN B12: 621

## 2018-06-05 NOTE — Progress Notes (Signed)
[facility]:  [facility] Room Number: 35 Place of Service:  SNF (31) Provider:Azalyn Sliwa, Meredith Staggers  MD  Virgie Dad, MD  Patient Care Team: Virgie Dad, MD as PCP - General (Internal Medicine) Annia Belt, MD as Consulting Physician (Hematology and Oncology) Suella Broad, MD as Consulting Physician (Physical Medicine and Rehabilitation) Noralee Space, MD (Pulmonary Disease) [name], [name], NP as Nurse Practitioner (Nurse Practitioner) Inda Castle, MD (Inactive) as Consulting Physician (Gastroenterology)  Extended Emergency Contact Information Primary Emergency Contact: Heiden,Gene Address: [address]          [place], [place] [address] Johnnette Litter of [place] Phone: [phone] Work Phone: [phone] Mobile Phone: [phone] Relation: Son Secondary Emergency Contact: [name], [place] [address] Johnnette Litter of [place] Phone: [phone] Relation: Son  Code Status:  DNR Goals of care: Advanced Directive information Advanced Directives 06/05/2018  Does Patient Have a Medical Advance Directive? Yes  Type of Paramedic of [place];Living will;Out of facility DNR (pink MOST or yellow form)  Does patient want to make changes to medical advance directive? No - Patient declined  Copy of [place] in Chart? Yes - validated most recent copy scanned in chart (See row information)  Pre-existing out of facility DNR order (yellow form or pink MOST form) Yellow form placed in chart (order not valid for inpatient use)     Chief Complaint  Patient presents with  . Medical Management of Chronic Issues    HPI:  Pt is a 85 y.o. female seen today for medical management of chronic diseases.   She is Long term resident of facility She has h/o Hypertension,Essential Thrombocythemia, H/o Esophageal Stricture, Achalasia,DVT,  Hyperlipidemia,  Arthritis, Cognitive Impairement, COPD  Patient has been doing well in facility She did not have any new complains She follows with Hematology for her Thrombocythemia Her weight is stable. She walks  with her Walker and Mild assist. Appetite is good  Past Medical History:  Diagnosis Date  . Abnormal weight gain 05/09/2012  . Achalasia of esophagus 09/18/2013   09/18/13 Nifedipine 10mg  tid ac meals. 11/17/14 [facility] f/u as neeed   . Anxiety and depression 03/12/2012   Increased Sertraline to 50mg  daily 07/16/13 08/26/13 Mirtazapine 7.5mg  qhs  10/12/14 Pharm decreased Sertraline to 25mg .  05/04/15 TSH 2.89 09/15/15 pharm taper off Sertraline.  10/08/15 continue Sertraline 25mg  daily, POA desires. 12/02/15 wbc 6.1, Hgb 12.0, plt 271    . Asthmatic bronchitis   . BACK PAIN, LUMBAR 03/12/2007   Hx of lower back pain, had X-ray of her lumbar spine 09/08/12--spondylosis-better controlled with Hydrocodone/ApAp  10/325mg  ac and hs  01/01/13 lumbar spine inj. Able to ambulate with walker on unit until her fall 06/22/13. 06/25/13 unremarkable. X-ray lumbar spine. 11/10/13 dc Oxycodone prn-not used in 60 days.     . COLONIC POLYPS 03/12/2007   Initially noted April 2002 colonoscopy. Followup colonoscopy 05/31/2006 did not disclose any further polyps.    Marland Kitchen COPD (chronic obstructive pulmonary disease) ([place])   . Coronary artery disease   . Deep vein thrombosis of left lower extremity ([place]) 07/19/2012  . Dementia ([place]) 09/16/2012   05/04/15 TSH 2.89   . Depression   . Diverticulosis 05/10/2012  . DJD (degenerative joint disease)   . Dry skin dermatitis 11/18/2012  . Edema of leg 07/01/2010  . Epistaxis 01/24/2016   01/21/16: x2, held  Xarelto x 1 day, Saline Nasal spray prn, wbc 9.5, Hgb 12.4, Plt 331, Na 138, K 4.5, Bun 25, creat 0.89 01/22/16 HRC16(38-45)  . Esophageal dysmotility 07/26/2013  . Esophageal dysphagia 05/27/2010   New dx of achalasia 6/ 2015 09/18/13 Nifedipine 10mg  tid ac meals.    . Esophageal stricture   .  Essential hypertension, benign 10/30/2013   12/02/15 wbc 6.1, Hgb 12.0, plt 271   . Essential thrombocythemia ([place]) 06/09/2009  . Fall 06/25/2013  . GERD (gastroesophageal reflux disease)   . Hip fracture ([place]) 09/08/2012  . HYPERCHOLESTEROLEMIA 03/12/2007   08/10/15 cholesterol 119, triglycerides 143, HDL 51, LDL 39   . Lumbar back pain   . MACULAR DEGENERATION 01/26/2008   Qualifier: Diagnosis of  By: Lenna Gilford MD, Deborra Medina   . Osteoarthritis 03/12/2007  . Osteoporosis 03/12/2007   Qualifier: Diagnosis of  By: Julien Girt CMA, Marliss Czar  06/15/14 dc Vit D 1000u po daily per pharm.    . Phlebitis and thrombophlebitis of other deep vessels of lower extremities 07/10/2010  . S/P balloon dilatation of esophageal stricture 07/28/2013   07/26/13. Dr. Henrene Pastor. EGD foreign removal and Balloon dilation of esophagus. Esophageal dysmotility, suspected achalasia, dilation at the GE junction to 70mm. Candida esophagitis.    . SYNCOPE 07/30/2009   Occurred 2011. Hospitalized. Felt due to dehydration.    . TRANSIENT ISCHEMIC ATTACKS, HX OF 03/12/2007   Qualifier: Diagnosis of  By: Julien Girt CMA, Marliss Czar     Past Surgical History:  Procedure Laterality Date  . ABDOMINAL HYSTERECTOMY     and bso  . APPENDECTOMY    . BOTOX INJECTION N/A 09/18/2013   Procedure: BOTOX INJECTION;  Surgeon: Lafayette Dragon, MD;  Location: [facility];  Service: Endoscopy;  Laterality: N/A;  . BOTOX INJECTION N/A 10/16/2014   Procedure: BOTOX INJECTION;  Surgeon: Inda Castle, MD;  Location: [facility];  Service: Endoscopy;  Laterality: N/A;  . BOTOX INJECTION N/A 05/18/2016   Procedure: BOTOX INJECTION;  Surgeon: Milus Banister, MD;  Location: [facility];  Service: Endoscopy;  Laterality: N/A;  . BOTOX INJECTION N/A 02/14/2018   Procedure: BOTOX INJECTION;  Surgeon: Milus Banister, MD;  Location: [facility];  Service: Endoscopy;  Laterality: N/A;  . CATARACT EXTRACTION W/ INTRAOCULAR LENS  IMPLANT, BILATERAL    . CHOLECYSTECTOMY    . ESOPHAGEAL  MANOMETRY N/A 08/25/2013   Procedure: ESOPHAGEAL MANOMETRY (EM);  Surgeon: Jerene Bears, MD;  Location: [facility];  Service: Gastroenterology;  Laterality: N/A;  . ESOPHAGOGASTRODUODENOSCOPY N/A 07/26/2013   Procedure: ESOPHAGOGASTRODUODENOSCOPY (EGD);  Surgeon: Irene Shipper, MD;  [facility] ENDOSCOPY;  Service: Endoscopy;  Laterality: N/A;  . ESOPHAGOGASTRODUODENOSCOPY N/A 10/16/2014   Procedure: ESOPHAGOGASTRODUODENOSCOPY (EGD);  Surgeon: Inda Castle, MD;  Location: [facility];  Service: Endoscopy;  Laterality: N/A;  with botox  . ESOPHAGOGASTRODUODENOSCOPY (EGD) WITH ESOPHAGEAL DILATION N/A 11/25/2012   Procedure: ESOPHAGOGASTRODUODENOSCOPY (EGD) WITH ESOPHAGEAL DILATION;  Surgeon: Jerene Bears, MD;  Location: [facility];  Service: Gastroenterology;  Laterality: N/A;  . ESOPHAGOGASTRODUODENOSCOPY (EGD) WITH PROPOFOL N/A 09/18/2013   Procedure: ESOPHAGOGASTRODUODENOSCOPY (EGD) WITH PROPOFOL;  Surgeon: Lafayette Dragon, MD;  Location: [facility];  Service: Endoscopy;  Laterality: N/A;  . ESOPHAGOGASTRODUODENOSCOPY (EGD) WITH PROPOFOL N/A 05/18/2016   Procedure: ESOPHAGOGASTRODUODENOSCOPY (EGD) WITH PROPOFOL;  Surgeon: Milus Banister, MD;  Location: [facility];  Service: Endoscopy;  Laterality: N/A;  . ESOPHAGOGASTRODUODENOSCOPY (EGD) WITH PROPOFOL N/A 02/14/2018   Procedure: ESOPHAGOGASTRODUODENOSCOPY (EGD) WITH PROPOFOL;  Surgeon: Milus Banister, MD;  Location:  [facility];  Service: Endoscopy;  Laterality: N/A;  . HIP ARTHROPLASTY Right 09/09/2012   Procedure: ARTHROPLASTY BIPOLAR HIP;  Surgeon: Mauri Pole, MD;  Location: [facility];  Service: Orthopedics;  Laterality: Right;  . TAH and BSO  08/21/1995    Allergies  Allergen Reactions  . Sulfamethoxazole Rash    Outpatient Encounter Medications as of 06/05/2018  Medication Sig  . acetaminophen (TYLENOL) 325 MG tablet Take 650 mg by mouth every 8 (eight) hours as needed for moderate pain.   Marland Kitchen albuterol (PROVENTIL HFA;VENTOLIN  HFA) 108 (90 BASE) MCG/ACT inhaler Inhale 2 puffs into the lungs every 6 (six) hours as needed for wheezing or shortness of breath.  Marland Kitchen atorvastatin (LIPITOR) 10 MG tablet Take 10 mg by mouth at bedtime.   . chlorhexidine (PERIDEX) 0.12 % solution Use as directed 5 mLs in the mouth or throat daily. SWISH AND SPIT 5ML FOR 30 SECONDS EVERYDAY INDEFINITELY  . cholecalciferol (VITAMIN D) 1000 units tablet Take 1,000 Units by mouth daily.  . fluticasone furoate-vilanterol (BREO ELLIPTA) 100-25 MCG/INH AEPB Inhale 1 puff into the lungs daily. One puff inhale once a day, rinse mouth after each use.   . Humidifier MISC Please use humidifier to prevent excessive dryness.  Marland Kitchen HYDROcodone-acetaminophen (NORCO) 10-325 MG tablet Take 1 tablet by mouth See admin instructions. Take 1 tablet by mouth SCHEDULED twice daily; may take an additional tablet once daily as needed for chronic pain.  . hydroxyurea (HYDREA) 500 MG capsule Take 500 mg by mouth daily. Take every Sunday, Wednesday and Friday also take 2 capsules=1000 mg on Monday. (Wear gloves when handling medication) May take with food to minimize GI side effects.  . memantine (NAMENDA) 10 MG tablet Take 10 mg by mouth 2 (two) times daily.   . metoprolol tartrate (LOPRESSOR) 25 MG tablet Take 25 mg by mouth 2 (two) times daily.  . montelukast (SINGULAIR) 10 MG tablet Take 10 mg by mouth at bedtime.  . Multiple Vitamins-Minerals (PRESERVISION AREDS 2) CAPS Take 1 capsule by mouth 2 (two) times daily.  Marland Kitchen NIFEdipine (PROCARDIA) 10 MG capsule Take 10 mg by mouth 3 (three) times daily before meals. Squeeze contents of one capsule under tongue 15-30 minutes before meals 3 times a day  . nitroGLYCERIN (NITROSTAT) 0.4 MG SL tablet Place 0.4 mg under the tongue every 5 (five) minutes as needed for chest pain.  Marland Kitchen omeprazole (PRILOSEC) 20 MG capsule Take 20 mg by mouth at bedtime.   . Rivaroxaban (XARELTO) 15 MG TABS tablet Take 15 mg by mouth daily. 5pm  . sertraline  (ZOLOFT) 25 MG tablet Take 25 mg by mouth daily.  . sodium chloride (OCEAN) 0.65 % nasal spray Place 1 spray into the nose 2 (two) times daily.  . sodium fluoride (DENTA 5000 PLUS) 1.1 % CREA dental cream Place 1 application onto teeth at bedtime. Apply a thin ribbon   No facility-administered encounter medications on file as of 06/05/2018.     Review of Systems  Constitutional: Negative.   HENT: Negative.   Respiratory: Negative.   Cardiovascular: Negative.   Gastrointestinal: Negative.   Genitourinary: Negative.   Musculoskeletal: Negative.   Skin: Negative.   Neurological: Negative.   Psychiatric/Behavioral: Negative.   All other systems reviewed and are negative.   Immunization History  Administered Date(s) Administered  . Influenza Split 11/16/2010, 12/13/2011  . Influenza Whole 11/27/2009, 11/29/2017  . Influenza-Unspecified 12/31/2013, 11/18/2014, 12/14/2015, 12/11/2016  . PPD Test 09/11/2012  . Pneumococcal Polysaccharide-23 02/28/1988  .  Tdap 05/31/2017   Pertinent  Health Maintenance Due  Topic Date Due  . PNA vac Low Risk Adult (2 of 2 - PCV13) 02/27/1989  . INFLUENZA VACCINE  09/28/2018  . DEXA SCAN  Completed   Fall Risk  02/08/2017 02/08/2017 10/04/2015 12/28/2014 10/23/2014  Falls in the past year? No Exclusion - non ambulatory No Yes Yes  Number falls in past yr: - - - 1 1  Injury with Fall? - - - No No  Risk for fall due to : - Impaired balance/gait;Impaired vision Impaired mobility;Impaired balance/gait History of fall(s);Impaired balance/gait;Impaired mobility Impaired mobility  Follow up - - - Falls prevention discussed Falls evaluation completed   Functional Status Survey:    Vitals:   06/05/18 0932  BP: 126/66  Pulse: 70  Resp: 17  Temp: 97.7 F (36.5 C)  SpO2: 93%  Weight: 101 lb (45.8 kg)  Height: 5\' 1"  (1.549 m)   Body mass index is 19.08 kg/m. Physical Exam Constitutional: Oriented to person, place, and time. Well-developed and  well-nourished.  HENT:  Head: Normocephalic.  Mouth/Throat: Oropharynx is clear and moist.  Eyes: Pupils are equal, round, and reactive to light.  Neck: Neck supple.  Cardiovascular: Normal rate and normal heart sounds.  No murmur heard. Pulmonary/Chest: Effort normal and breath sounds normal. No respiratory distress. No wheezes. She has no rales.  Abdominal: Soft. Bowel sounds are normal. No distension. There is no tenderness. There is no rebound.  Musculoskeletal: No edema.  Lymphadenopathy: none Neurological: Alert and oriented to person, place, and time.  Skin: Skin is warm and dry.  Psychiatric: Normal mood and affect. Behavior is normal. Thought content normal.   Labs reviewed: Recent Labs    09/18/17 11/21/17  NA 142 145  K 3.8 4.0  CL 104 111  CO2 30 29  BUN 21 17  CREATININE 0.8 0.7  CALCIUM 9.4 8.5  MG 1.9  --    Recent Labs    09/18/17 11/21/17  AST 19 12*  ALT 13 10  ALKPHOS 71 66  PROT 6.8 5.1  ALBUMIN 4.2 3.2   Recent Labs    07/03/17  10/29/17 11/21/17 01/01/18  WBC 6.9   < > 6.3 5.7 5.6  NEUTROABS 4,264  --   --   --   --   HGB 12.3   < > 12.3 11.2* 11.6*  HCT 36   < > 36 33* 34*  PLT 269   < > 264 333 292   < > = values in this interval not displayed.   Lab Results  Component Value Date   TSH 2.00 09/21/2016   Lab Results  Component Value Date   HGBA1C 5.5 10/16/2014   Lab Results  Component Value Date   CHOL 143 09/18/2017   HDL 50 09/18/2017   LDLCALC 70 09/18/2017   LDLDIRECT 152.3 06/04/2009   TRIG 147 09/18/2017   CHOLHDL 3 09/13/2011    Significant Diagnostic Results in last 30 days:  No results found.  Assessment/Plan Essential hypertension Stable on Metoprolol Essential Thrombocythemia She is on Hydrea And Per hematology she needs to have CBC Q3 months and they will follow the results  Chronic deep vein thrombosis  On Chronic Xarelto COPD with asthma  Stable on Singulair and Breo Anemia With Macrocytosis Folate and  B12 level have been normal Due to Hydrea GERD Continue on Prilosec  Esophageal dysmotility Follows with GI On Nifedipine  Late onset Alzheimer's disease  Stable on Namenda  Hyperlipidemia LDL  goal <70 Continue Statin     Family/ staff Communication:   Labs/tests ordered:   Total time spent in this patient care encounter was 25_ minutes; greater than 50% of the visit spent counseling patient, reviewing records , Labs and coordinating care for problems addressed at this encounter.

## 2018-06-25 ENCOUNTER — Other Ambulatory Visit: Payer: Self-pay | Admitting: *Deleted

## 2018-06-25 DIAGNOSIS — M5440 Lumbago with sciatica, unspecified side: Secondary | ICD-10-CM

## 2018-06-25 MED ORDER — HYDROCODONE-ACETAMINOPHEN 10-325 MG PO TABS: ORAL_TABLET | ORAL | 0 refills | Status: DC

## 2018-06-25 NOTE — Telephone Encounter (Signed)
Received fax order from [facility] and sent to Dr. Lyndel Safe for approval.

## 2018-07-01 ENCOUNTER — Non-Acute Institutional Stay (SKILLED_NURSING_FACILITY): Payer: Medicare Other | Admitting: Nurse Practitioner

## 2018-07-01 ENCOUNTER — Encounter: Payer: Self-pay | Admitting: Nurse Practitioner

## 2018-07-01 DIAGNOSIS — I82502 Chronic embolism and thrombosis of unspecified deep veins of left lower extremity: Secondary | ICD-10-CM | POA: Diagnosis not present

## 2018-07-01 DIAGNOSIS — K224 Dyskinesia of esophagus: Secondary | ICD-10-CM | POA: Diagnosis not present

## 2018-07-01 DIAGNOSIS — F0280 Dementia in other diseases classified elsewhere without behavioral disturbance: Secondary | ICD-10-CM

## 2018-07-01 DIAGNOSIS — F329 Major depressive disorder, single episode, unspecified: Secondary | ICD-10-CM

## 2018-07-01 DIAGNOSIS — G301 Alzheimer's disease with late onset: Secondary | ICD-10-CM | POA: Diagnosis not present

## 2018-07-01 DIAGNOSIS — F32A Depression, unspecified: Secondary | ICD-10-CM

## 2018-07-01 DIAGNOSIS — I119 Hypertensive heart disease without heart failure: Secondary | ICD-10-CM | POA: Diagnosis not present

## 2018-07-01 DIAGNOSIS — F419 Anxiety disorder, unspecified: Secondary | ICD-10-CM

## 2018-07-01 DIAGNOSIS — D473 Essential (hemorrhagic) thrombocythemia: Secondary | ICD-10-CM

## 2018-07-01 NOTE — Assessment & Plan Note (Signed)
stable, continue Omeprazole 20mg  qd, Nifedipine 10mg  tid

## 2018-07-01 NOTE — Assessment & Plan Note (Signed)
Stable, last plt 311 04/30/18, continue Hydrea 500mg  qd.

## 2018-07-01 NOTE — Progress Notes (Signed)
Location:  [facility] Room Number: 35 Place of Service:  SNF (31) Provider:  Marlana Latus  NP  Virgie Dad, MD  Patient Care Team: Virgie Dad, MD as PCP - General (Internal Medicine) Annia Belt, MD as Consulting Physician (Hematology and Oncology) Suella Broad, MD as Consulting Physician (Physical Medicine and Rehabilitation) Noralee Space, MD (Pulmonary Disease) [place] Uthman Mroczkowski X, NP as Nurse Practitioner (Nurse Practitioner) Inda Castle, MD (Inactive) as Consulting Physician (Gastroenterology)  Extended Emergency Contact Information Primary Emergency Contact: Kato,Gene Address: [address]          [place], [place] [address] [place] Phone: [phone] Work Phone: [phone] Mobile Phone: [phone] Relation: Son Secondary Emergency Contact: [name], [place] [address] [address] Phone: [phone] Relation: Son  Code Status:  DNR Goals of care: Advanced Directive information Advanced Directives 07/01/2018  Does Patient Have a Medical Advance Directive? Yes  Type of Paramedic of [place];Living will;Out of facility DNR (pink MOST or yellow form)  Does patient want to make changes to medical advance directive? No - Patient declined  Copy of [place] in Chart? Yes - validated most recent copy scanned in chart (See row information)  Pre-existing out of facility DNR order (yellow form or pink MOST form) Yellow form placed in chart (order not valid for inpatient use)     Chief Complaint  Patient presents with   Medical Management of Chronic Issues    HPI:  Pt is a 85 y.o. female seen today for medical management of chronic diseases.    The patient resides in SNF [facility] for safety and care assistance, Memantine 10mg  bid. Her mood is stable on Sertraline 25mg  qd. GERD stable, continue  Omeprazole 20mg  qd, Nifedipine 10mg  tid, HTN, blood pressure is controlled Metoprolol 25mg  bid. Hx of DVTs, on Xarelto 15mg  qd. Essential thrombocythemia, on Hydrea 500mg  qd, last plt. 311 04/30/18   Past Medical History:  Diagnosis Date   Abnormal weight gain 05/09/2012   Achalasia of esophagus 09/18/2013   09/18/13 Nifedipine 10mg  tid ac meals. 11/17/14 [facility] f/u as neeed    Anxiety and depression 03/12/2012   Increased Sertraline to 50mg  daily 07/16/13 08/26/13 Mirtazapine 7.5mg  qhs  10/12/14 Pharm decreased Sertraline to 25mg .  05/04/15 TSH 2.89 09/15/15 pharm taper off Sertraline.  10/08/15 continue Sertraline 25mg  daily, POA desires. 12/02/15 wbc 6.1, Hgb 12.0, plt 271     Asthmatic bronchitis    BACK PAIN, LUMBAR 03/12/2007   Hx of lower back pain, had X-ray of her lumbar spine 09/08/12--spondylosis-better controlled with Hydrocodone/ApAp  10/325mg  ac and hs  01/01/13 lumbar spine inj. Able to ambulate with walker on unit until her fall 06/22/13. 06/25/13 unremarkable. X-ray lumbar spine. 11/10/13 dc Oxycodone prn-not used in 60 days.      COLONIC POLYPS 03/12/2007   Initially noted April 2002 colonoscopy. Followup colonoscopy 05/31/2006 did not disclose any further polyps.     COPD (chronic obstructive pulmonary disease) (HCC)    Coronary artery disease    Deep vein thrombosis of left lower extremity ([place]) 07/19/2012   Dementia ([place]) 09/16/2012   05/04/15 TSH 2.89    Depression    Diverticulosis 05/10/2012   DJD (degenerative joint disease)    Dry skin dermatitis 11/18/2012   Edema of leg 07/01/2010   Epistaxis 01/24/2016   01/21/16: x2, held  Xarelto x 1 day, Saline Nasal spray prn, wbc 9.5, Hgb 12.4, Plt 331, Na 138, K 4.5, Bun 25, creat 0.89 01/22/16 ASN05(39-76)   Esophageal dysmotility 07/26/2013   Esophageal dysphagia 05/27/2010   New dx of achalasia 6/ 2015 09/18/13 Nifedipine 10mg  tid ac meals.     Esophageal stricture    Essential hypertension, benign 10/30/2013   12/02/15 wbc 6.1,  Hgb 12.0, plt 271    Essential thrombocythemia ([place]) 06/09/2009   Fall 06/25/2013   GERD (gastroesophageal reflux disease)    Hip fracture ([place]) 09/08/2012   HYPERCHOLESTEROLEMIA 03/12/2007   08/10/15 cholesterol 119, triglycerides 143, HDL 51, LDL 39    Lumbar back pain    MACULAR DEGENERATION 01/26/2008   Qualifier: Diagnosis of  By: Lenna Gilford MD, Deborra Medina    Osteoarthritis 03/12/2007   Osteoporosis 03/12/2007   Qualifier: Diagnosis of  By: Julien Girt CMA, Marliss Czar  06/15/14 dc Vit D 1000u po daily per pharm.     Phlebitis and thrombophlebitis of other deep vessels of lower extremities 07/10/2010   S/P balloon dilatation of esophageal stricture 07/28/2013   07/26/13. Dr. Henrene Pastor. EGD foreign removal and Balloon dilation of esophagus. Esophageal dysmotility, suspected achalasia, dilation at the GE junction to 24mm. Candida esophagitis.     SYNCOPE 07/30/2009   Occurred 2011. Hospitalized. Felt due to dehydration.     TRANSIENT ISCHEMIC ATTACKS, HX OF 03/12/2007   Qualifier: Diagnosis of  By: Julien Girt CMA, Marliss Czar     Past Surgical History:  Procedure Laterality Date   ABDOMINAL HYSTERECTOMY     and bso   APPENDECTOMY     BOTOX INJECTION N/A 09/18/2013   Procedure: BOTOX INJECTION;  Surgeon: Lafayette Dragon, MD;  Location: [facility] END[facility];  Service: Endoscopy;  Laterality: N/A;   BOTOX INJECTION N/A 10/16/2014   Procedure: BOTOX INJECTION;  Surgeon: Inda Castle, MD;  Location: [facility];  Service: Endoscopy;  Laterality: N/A;   BOTOX INJECTION N/A 05/18/2016   Procedure: BOTOX INJECTION;  Surgeon: Milus Banister, MD;  Location: [facility];  Service: Endoscopy;  Laterality: N/A;   BOTOX INJECTION N/A 02/14/2018   Procedure: BOTOX INJECTION;  Surgeon: Milus Banister, MD;  Location: [facility];  Service: Endoscopy;  Laterality: N/A;   CATARACT EXTRACTION W/ INTRAOCULAR LENS  IMPLANT, BILATERAL     CHOLECYSTECTOMY     ESOPHAGEAL MANOMETRY N/A 08/25/2013   Procedure: ESOPHAGEAL MANOMETRY  (EM);  Surgeon: Jerene Bears, MD;  Location: [facility];  Service: Gastroenterology;  Laterality: N/A;   ESOPHAGOGASTRODUODENOSCOPY N/A 07/26/2013   Procedure: ESOPHAGOGASTRODUODENOSCOPY (EGD);  Surgeon: Irene Shipper, MD;  Location[facility] END[facility]COPY;  Service: Endoscopy;  Laterality: N/A;   ESOPHAGOGASTRODUODENOSCOPY N/A 10/16/2014   Procedure: ESOPHAGOGASTRODUODENOSCOPY (EGD);  Surgeon: Inda Castle, MD;  Location: [facility];  Service: Endoscopy;  Laterality: N/A;  with botox   ESOPHAGOGASTRODUODENOSCOPY (EGD) WITH ESOPHAGEAL DILATION N/A 11/25/2012   Procedure: ESOPHAGOGASTRODUODENOSCOPY (EGD) WITH ESOPHAGEAL DILATION;  Surgeon: Jerene Bears, MD;  [facility]: [facility];  Service: Gastroenterology;  Laterality: N/A;   ESOPHAGOGASTRODUODENOSCOPY (EGD) WITH PROPOFOL N/A 09/18/2013   Procedure: ESOPHAGOGASTRODUODENOSCOPY (EGD) WITH PROPOFOL;  Surgeon: Lafayette Dragon, MD;  Location: [facility];  Service: Endoscopy;  Laterality: N/A;   ESOPHAGOGASTRODUODENOSCOPY (EGD) WITH PROPOFOL N/A 05/18/2016   Procedure: ESOPHAGOGASTRODUODENOSCOPY (EGD) WITH PROPOFOL;  Surgeon: Milus Banister, MD;  Location: [facility];  Service: Endoscopy;  Laterality: N/A;   ESOPHAGOGASTRODUODENOSCOPY (EGD) WITH PROPOFOL N/A 02/14/2018   Procedure: ESOPHAGOGASTRODUODENOSCOPY (EGD) WITH PROPOFOL;  Surgeon: Milus Banister, MD;  Location:  [facility];  Service: Endoscopy;  Laterality: N/A;   HIP ARTHROPLASTY Right 09/09/2012   Procedure: ARTHROPLASTY BIPOLAR HIP;  Surgeon: Mauri Pole, MD;  Location: [facility];  Service: Orthopedics;  Laterality: Right;   TAH and BSO  08/21/1995    Allergies  Allergen Reactions   Sulfamethoxazole Rash    Outpatient Encounter Medications as of 07/01/2018  Medication Sig   acetaminophen (TYLENOL) 325 MG tablet Take 650 mg by mouth every 8 (eight) hours as needed for moderate pain.    albuterol (PROVENTIL HFA;VENTOLIN HFA) 108 (90 BASE) MCG/ACT inhaler Inhale 2 puffs into the  lungs every 6 (six) hours as needed for wheezing or shortness of breath.   atorvastatin (LIPITOR) 10 MG tablet Take 10 mg by mouth at bedtime.    chlorhexidine (PERIDEX) 0.12 % solution Use as directed 5 mLs in the mouth or throat daily. SWISH AND SPIT 5ML FOR 30 SECONDS EVERYDAY INDEFINITELY   cholecalciferol (VITAMIN D) 1000 units tablet Take 1,000 Units by mouth daily.   fluticasone furoate-vilanterol (BREO ELLIPTA) 100-25 MCG/INH AEPB Inhale 1 puff into the lungs daily. One puff inhale once a day, rinse mouth after each use.    Humidifier MISC Please use humidifier to prevent excessive dryness.   HYDROcodone-acetaminophen (NORCO) 10-325 MG tablet Take 1 tablet by mouth SCHEDULED twice daily; may take an additional tablet once daily as needed for increased back pain.   hydroxyurea (HYDREA) 500 MG capsule Take 500 mg by mouth daily. Take every Sunday, Wednesday and Friday also take 2 capsules=1000 mg on Monday. (Wear gloves when handling medication) May take with food to minimize GI side effects.   memantine (NAMENDA) 10 MG tablet Take 10 mg by mouth 2 (two) times daily.    metoprolol tartrate (LOPRESSOR) 25 MG tablet Take 25 mg by mouth 2 (two) times daily.   montelukast (SINGULAIR) 10 MG tablet Take 10 mg by mouth at bedtime.   Multiple Vitamins-Minerals (PRESERVISION AREDS 2) CAPS Take 1 capsule by mouth 2 (two) times daily.   NIFEdipine (PROCARDIA) 10 MG capsule Take 10 mg by mouth 3 (three) times daily before meals. Squeeze contents of one capsule under tongue 15-30 minutes before meals 3 times a day   nitroGLYCERIN (NITROSTAT) 0.4 MG SL tablet Place 0.4 mg under the tongue every 5 (five) minutes as needed for chest pain.   omeprazole (PRILOSEC) 20 MG capsule Take 20 mg by mouth at bedtime.    Rivaroxaban (XARELTO) 15 MG TABS tablet Take 15 mg by mouth daily. 5pm   sertraline (ZOLOFT) 25 MG tablet Take 25 mg by mouth daily.   sodium chloride (OCEAN) 0.65 % nasal spray Place 1  spray into the nose 2 (two) times daily.   sodium fluoride (DENTA 5000 PLUS) 1.1 % CREA dental cream Place 1 application onto teeth at bedtime. Apply a thin ribbon   No facility-administered encounter medications on file as of 07/01/2018.    ROS was provided with assistance of staff.  Review of Systems  Constitutional: Negative for activity change, appetite change, chills, diaphoresis, fatigue and unexpected weight change.  HENT: Positive for hearing loss. Negative for congestion and voice change.   Eyes: Negative for visual disturbance.  Respiratory: Negative for cough, shortness of breath and wheezing.   Cardiovascular: Positive for leg swelling. Negative for chest pain and palpitations.  Gastrointestinal: Negative for abdominal distention, abdominal pain, constipation, diarrhea, nausea and vomiting.  Genitourinary: Negative for difficulty urinating, dysuria and urgency.  Musculoskeletal: Positive for back pain and gait  problem.  Skin: Negative for color change and pallor.  Neurological: Negative for dizziness, speech difficulty, weakness and headaches.       Memory lapses.   Psychiatric/Behavioral: Negative for agitation, behavioral problems, hallucinations and sleep disturbance. The patient is not nervous/anxious.     Immunization History  Administered Date(s) Administered   Influenza Split 11/16/2010, 12/13/2011   Influenza Whole 11/27/2009, 11/29/2017   Influenza-Unspecified 12/31/2013, 11/18/2014, 12/14/2015, 12/11/2016   PPD Test 09/11/2012   Pneumococcal Polysaccharide-23 02/28/1988   Tdap 05/31/2017   Pertinent  Health Maintenance Due  Topic Date Due   PNA vac Low Risk Adult (2 of 2 - PCV13) 02/27/1989   INFLUENZA VACCINE  09/28/2018   DEXA SCAN  Completed   Fall Risk  02/08/2017 02/08/2017 10/04/2015 12/28/2014 10/23/2014  Falls in the past year? No Exclusion - non ambulatory No Yes Yes  Number falls in past yr: - - - 1 1  Injury with Fall? - - - No No  Risk  for fall due to : - Impaired balance/gait;Impaired vision Impaired mobility;Impaired balance/gait History of fall(s);Impaired balance/gait;Impaired mobility Impaired mobility  Follow up - - - Falls prevention discussed Falls evaluation completed   Functional Status Survey:    Vitals:   07/01/18 1107  BP: 140/80  Pulse: 76  Resp: 16  Temp: 97.8 F (36.6 C)  SpO2: 92%  Weight: 99 lb 4.8 oz (45 kg)  Height: 5\' 1"  (1.549 m)   Body mass index is 18.76 kg/m. Physical Exam Constitutional:      General: She is not in acute distress.    Appearance: Normal appearance. She is normal weight. She is not ill-appearing, toxic-appearing or diaphoretic.  HENT:     Head: Normocephalic and atraumatic.     Nose: Nose normal. No congestion or rhinorrhea.     Mouth/Throat:     Mouth: Mucous membranes are moist.  Eyes:     Extraocular Movements: Extraocular movements intact.     Pupils: Pupils are equal, round, and reactive to light.  Neck:     Musculoskeletal: Normal range of motion and neck supple.  Cardiovascular:     Rate and Rhythm: Normal rate and regular rhythm.     Heart sounds: No murmur.  Pulmonary:     Effort: Pulmonary effort is normal.     Breath sounds: No wheezing, rhonchi or rales.  Abdominal:     General: Bowel sounds are normal.     Palpations: Abdomen is soft.     Tenderness: There is no abdominal tenderness. There is no right CVA tenderness, left CVA tenderness, guarding or rebound.  Musculoskeletal:     Right lower leg: Edema present.     Left lower leg: Edema present.     Comments: Trace edema BLE. W/c for mobility.   Skin:    General: Skin is warm and dry.  Neurological:     General: No focal deficit present.     Mental Status: She is alert. Mental status is at baseline.     Cranial Nerves: No cranial nerve deficit.     Motor: No weakness.     Coordination: Coordination normal.     Gait: Gait abnormal.     Comments: Oriented to person and place.   Psychiatric:         Mood and Affect: Mood normal.        Behavior: Behavior normal.     Labs reviewed: Recent Labs    09/18/17 11/21/17 04/30/18  NA 142 145 140  K  3.8 4.0 4.5  CL 104 111 104  CO2 30 29 29   BUN 21 17 19   CREATININE 0.8 0.7 0.7  CALCIUM 9.4 8.5 8.9  MG 1.9  --   --    Recent Labs    09/18/17 11/21/17 04/30/18  AST 19 12* 16  ALT 13 10 9   ALKPHOS 71 66 70  PROT 6.8 5.1 5.8  ALBUMIN 4.2 3.2 3.4   Recent Labs    07/03/17  11/21/17 01/01/18 04/30/18  WBC 6.9   < > 5.7 5.6 6.3  NEUTROABS 4,264  --   --   --   --   HGB 12.3   < > 11.2* 11.6* 11.7*  HCT 36   < > 33* 34* 35*  PLT 269   < > 333 292 311   < > = values in this interval not displayed.   Lab Results  Component Value Date   TSH 2.00 09/21/2016   Lab Results  Component Value Date   HGBA1C 5.5 10/16/2014   Lab Results  Component Value Date   CHOL 143 09/18/2017   HDL 50 09/18/2017   LDLCALC 70 09/18/2017   LDLDIRECT 152.3 06/04/2009   TRIG 147 09/18/2017   CHOLHDL 3 09/13/2011    Significant Diagnostic Results in last 30 days:  No results found.  Assessment/Plan Hypertensive heart disease blood pressure is controlled, continue Metoprolol 25mg  bid.  Deep vein thrombosis of left lower extremity (HCC) Stable, continue Xarelto 15mg  qd.   Esophageal dysmotility stable, continue Omeprazole 20mg  qd, Nifedipine 10mg  tid  Alzheimer's dementia without behavioral disturbance ([place]) Continue SNF [facility] for safety and care assistance, continue Memantine 10mg  bid for memory.   Essential thrombocythemia ([place]) Stable, last plt 311 04/30/18, continue Hydrea 500mg  qd.   Anxiety and depression Her mood is stable, continue Sertraline 25mg  qd.      Family/ staff Communication: plan of care reviewed with the patient and charge nurse.   Labs/tests ordered:  none  Time spend 25 minutes.

## 2018-07-01 NOTE — Assessment & Plan Note (Signed)
Stable, continue Xarelto 15mg  qd.

## 2018-07-01 NOTE — Assessment & Plan Note (Signed)
blood pressure is controlled, continue Metoprolol 25mg bid.  

## 2018-07-01 NOTE — Assessment & Plan Note (Signed)
Her mood is stable, continue Sertraline 25mg  qd.

## 2018-07-01 NOTE — Assessment & Plan Note (Signed)
Continue SNF [facility] for safety and care assistance, continue Memantine 10mg  bid for memory.

## 2018-07-29 ENCOUNTER — Non-Acute Institutional Stay (SKILLED_NURSING_FACILITY): Payer: Medicare Other | Admitting: Nurse Practitioner

## 2018-07-29 ENCOUNTER — Encounter: Payer: Self-pay | Admitting: Nurse Practitioner

## 2018-07-29 ENCOUNTER — Other Ambulatory Visit: Payer: Self-pay | Admitting: *Deleted

## 2018-07-29 DIAGNOSIS — M5440 Lumbago with sciatica, unspecified side: Secondary | ICD-10-CM

## 2018-07-29 DIAGNOSIS — F32A Depression, unspecified: Secondary | ICD-10-CM

## 2018-07-29 DIAGNOSIS — G8929 Other chronic pain: Secondary | ICD-10-CM

## 2018-07-29 DIAGNOSIS — I10 Essential (primary) hypertension: Secondary | ICD-10-CM

## 2018-07-29 DIAGNOSIS — G301 Alzheimer's disease with late onset: Secondary | ICD-10-CM | POA: Diagnosis not present

## 2018-07-29 DIAGNOSIS — D473 Essential (hemorrhagic) thrombocythemia: Secondary | ICD-10-CM | POA: Diagnosis not present

## 2018-07-29 DIAGNOSIS — Z86718 Personal history of other venous thrombosis and embolism: Secondary | ICD-10-CM

## 2018-07-29 DIAGNOSIS — F0280 Dementia in other diseases classified elsewhere without behavioral disturbance: Secondary | ICD-10-CM

## 2018-07-29 DIAGNOSIS — F329 Major depressive disorder, single episode, unspecified: Secondary | ICD-10-CM

## 2018-07-29 DIAGNOSIS — K219 Gastro-esophageal reflux disease without esophagitis: Secondary | ICD-10-CM

## 2018-07-29 MED ORDER — HYDROCODONE-ACETAMINOPHEN 10-325 MG PO TABS: ORAL_TABLET | ORAL | 0 refills | Status: DC

## 2018-07-29 NOTE — Progress Notes (Signed)
Location:   SNF [place] Room Number: 35/A Place of Service:  SNF (31) Provider: [facility] Kayven Aldaco NP  Virgie Dad, MD  Patient Care Team: Virgie Dad, MD as PCP - General (Internal Medicine) Annia Belt, MD as Consulting Physician (Hematology and Oncology) Suella Broad, MD as Consulting Physician (Physical Medicine and Rehabilitation) Noralee Space, MD (Pulmonary Disease) [place] Marvelous [place]ford X, NP as Nurse Practitioner (Nurse Practitioner) Inda Castle, MD (Inactive) as Consulting Physician (Gastroenterology)  Extended Emergency Contact Information Primary Emergency Contact: Sando,Gene Address: [address]          [place], [place] [address] Johnnette Litter of [place] Phone: [phone] Work Phone: [phone] Mobile Phone: [phone] Relation: Son Secondary Emergency Contact: [name], [place] [address] Johnnette Litter of [place] Phone: [phone] Relation: Son  Code Status:  DNR Goals of care: Advanced Directive information Advanced Directives 07/29/2018  Does Patient Have a Medical Advance Directive? Yes  Type of Paramedic of [place];Living will;Out of facility DNR (pink MOST or yellow form)  Does patient want to make changes to medical advance directive? No - Patient declined  Copy of [place] in Chart? Yes - validated most recent copy scanned in chart (See row information)  Pre-existing out of facility DNR order (yellow form or pink MOST form) Yellow form placed in chart (order not valid for inpatient use)     Chief Complaint  Patient presents with  . Medical Management of Chronic Issues    Routine visit     HPI:  Pt is a 85 y.o. female seen today for medical management of chronic diseases.     The patient resides in SNF [facility] for safety and care assistance, on Memantine 10mg  bid for memory, self propels w/c to get around. Hx of depression,  her mood is stable on Sertraline 25mg  qd, weights are stable. Hx of DVTs, long term use of Rivaroxaban 15mg  qd. GERD, stable on Omeprazole 20mg  qd, Nifedipine 10mg  ac meals. HTN, blood pressure is controlled on Metoprolol 25mg  bid. Essential thrombocythemia, stable on Hydroxyurea 500mg  qd, plt 311 3/04/30/18,  chronic lower back pain is managed with Norco 10/325mg  bid, Tylenol 650mg  q8h prn.    Past Medical History:  Diagnosis Date  . Abnormal weight gain 05/09/2012  . Achalasia of esophagus 09/18/2013   09/18/13 Nifedipine 10mg  tid ac meals. 11/17/14 [facility] f/u as neeed   . Anxiety and depression 03/12/2012   Increased Sertraline to 50mg  daily 07/16/13 08/26/13 Mirtazapine 7.5mg  qhs  10/12/14 Pharm decreased Sertraline to 25mg .  05/04/15 TSH 2.89 09/15/15 pharm taper off Sertraline.  10/08/15 continue Sertraline 25mg  daily, POA desires. 12/02/15 wbc 6.1, Hgb 12.0, plt 271    . Asthmatic bronchitis   . BACK PAIN, LUMBAR 03/12/2007   Hx of lower back pain, had X-ray of her lumbar spine 09/08/12--spondylosis-better controlled with Hydrocodone/ApAp  10/325mg  ac and hs  01/01/13 lumbar spine inj. Able to ambulate with walker on unit until her fall 06/22/13. 06/25/13 unremarkable. X-ray lumbar spine. 11/10/13 dc Oxycodone prn-not used in 60 days.     . COLONIC POLYPS 03/12/2007   Initially noted April 2002 colonoscopy. Followup colonoscopy 05/31/2006 did not disclose any further polyps.    Marland Kitchen COPD (chronic obstructive pulmonary disease) ([place])   . Coronary artery disease   . Deep vein thrombosis of left lower extremity ([place]) 07/19/2012  . Dementia ([place]) 09/16/2012  05/04/15 TSH 2.89   . Depression   . Diverticulosis 05/10/2012  . DJD (degenerative joint disease)   . Dry skin dermatitis 11/18/2012  . Edema of leg 07/01/2010  . Epistaxis 01/24/2016   01/21/16: x2, held Xarelto x 1 day, Saline Nasal spray prn, wbc 9.5, Hgb 12.4, Plt 331, Na 138, K 4.5, Bun 25, creat 0.89 01/22/16 NWG95(62-13)  . Esophageal dysmotility  07/26/2013  . Esophageal dysphagia 05/27/2010   New dx of achalasia 6/ 2015 09/18/13 Nifedipine 10mg  tid ac meals.    . Esophageal stricture   . Essential hypertension, benign 10/30/2013   12/02/15 wbc 6.1, Hgb 12.0, plt 271   . Essential thrombocythemia ([place]) 06/09/2009  . Fall 06/25/2013  . GERD (gastroesophageal reflux disease)   . Hip fracture ([place]) 09/08/2012  . HYPERCHOLESTEROLEMIA 03/12/2007   08/10/15 cholesterol 119, triglycerides 143, HDL 51, LDL 39   . Lumbar back pain   . MACULAR DEGENERATION 01/26/2008   Qualifier: Diagnosis of  By: Lenna Gilford MD, Deborra Medina   . Osteoarthritis 03/12/2007  . Osteoporosis 03/12/2007   Qualifier: Diagnosis of  By: Julien Girt CMA, Marliss Czar  06/15/14 dc Vit D 1000u po daily per pharm.    . Phlebitis and thrombophlebitis of other deep vessels of lower extremities 07/10/2010  . S/P balloon dilatation of esophageal stricture 07/28/2013   07/26/13. Dr. Henrene Pastor. EGD foreign removal and Balloon dilation of esophagus. Esophageal dysmotility, suspected achalasia, dilation at the GE junction to 39mm. Candida esophagitis.    . SYNCOPE 07/30/2009   Occurred 2011. Hospitalized. Felt due to dehydration.    . TRANSIENT ISCHEMIC ATTACKS, HX OF 03/12/2007   Qualifier: Diagnosis of  By: Julien Girt CMA, Marliss Czar     Past Surgical History:  Procedure Laterality Date  . ABDOMINAL HYSTERECTOMY     and bso  . APPENDECTOMY    . BOTOX INJECTION N/A 09/18/2013   Procedure: BOTOX INJECTION;  Surgeon: Lafayette Dragon, MD;  Location: [facility] END[facility];  Service: Endoscopy;  Laterality: N/A;  . BOTOX INJECTION N/A 10/16/2014   Procedure: BOTOX INJECTION;  Surgeon: Inda Castle, MD;  Location: [facility];  Service: Endoscopy;  Laterality: N/A;  . BOTOX INJECTION N/A 05/18/2016   Procedure: BOTOX INJECTION;  Surgeon: Milus Banister, MD;  Location: [facility];  Service: Endoscopy;  Laterality: N/A;  . BOTOX INJECTION N/A 02/14/2018   Procedure: BOTOX INJECTION;  Surgeon: Milus Banister, MD;  Location: [facility]  ENDOS[facility];  Service: Endoscopy;  Laterality: N/A;  . CATARACT EXTRACTION W/ INTRAOCULAR LENS  IMPLANT, BILATERAL    . CHOLECYSTECTOMY    . ESOPHAGEAL MANOMETRY N/A 08/25/2013   Procedure: ESOPHAGEAL MANOMETRY (EM);  Surgeon: Jerene Bears, MD;  Location: [facility];  Service: Gastroenterology;  Laterality: N/A;  . ESOPHAGOGASTRODUODENOSCOPY N/A 07/26/2013   Procedure: ESOPHAGOGASTRODUODENOSCOPY (EGD);  Surgeon: Irene Shipper, MD;  Location[facility]COPY;  Service: Endoscopy;  Laterality: N/A;  . ESOPHAGOGASTRODUODENOSCOPY N/A 10/16/2014   Procedure: ESOPHAGOGASTRODUODENOSCOPY (EGD);  Surgeon: Inda Castle, MD;  Location: [facility];  Service: Endoscopy;  Laterality: N/A;  with botox  . ESOPHAGOGASTRODUODENOSCOPY (EGD) WITH ESOPHAGEAL DILATION N/A 11/25/2012   Procedure: ESOPHAGOGASTRODUODENOSCOPY (EGD) WITH ESOPHAGEAL DILATION;  Surgeon: Jerene Bears, MD;  [facility];  Service: Gastroenterology;  Laterality: N/A;  . ESOPHAGOGASTRODUODENOSCOPY (EGD) WITH PROPOFOL N/A 09/18/2013   Procedure: ESOPHAGOGASTRODUODENOSCOPY (EGD) WITH PROPOFOL;  Surgeon: Lafayette Dragon, MD;  [facility];  Service: Endoscopy;  Laterality: N/A;  . ESOPHAGOGASTRODUODENOSCOPY (EGD) WITH PROPOFOL N/A 05/18/2016   Procedure: ESOPHAGOGASTRODUODENOSCOPY (EGD) WITH  PROPOFOL;  Surgeon: Milus Banister, MD;  Location: [facility];  Service: Endoscopy;  Laterality: N/A;  . ESOPHAGOGASTRODUODENOSCOPY (EGD) WITH PROPOFOL N/A 02/14/2018   Procedure: ESOPHAGOGASTRODUODENOSCOPY (EGD) WITH PROPOFOL;  Surgeon: Milus Banister, MD;  [facility];  Service: Endoscopy;  Laterality: N/A;  . HIP ARTHROPLASTY Right 09/09/2012   Procedure: ARTHROPLASTY BIPOLAR HIP;  Surgeon: Mauri Pole, MD;  Location: [facility];  Service: Orthopedics;  Laterality: Right;  . TAH and BSO  08/21/1995    Allergies  Allergen Reactions  . Sulfamethoxazole Rash    Allergies as of 07/29/2018      Reactions   Sulfamethoxazole Rash       Medication List       Accurate as of July 29, 2018 11:59 PM. If you have any questions, ask your nurse or doctor.        acetaminophen 325 MG tablet Commonly known as:  TYLENOL Take 650 mg by mouth every 8 (eight) hours as needed for moderate pain.   albuterol 108 (90 Base) MCG/ACT inhaler Commonly known as:  VENTOLIN HFA Inhale 2 puffs into the lungs every 6 (six) hours as needed for wheezing or shortness of breath.   atorvastatin 10 MG tablet Commonly known as:  LIPITOR Take 10 mg by mouth at bedtime.   Breo Ellipta 100-25 MCG/INH Aepb Generic drug:  fluticasone furoate-vilanterol Inhale 1 puff into the lungs daily. One puff inhale once a day, rinse mouth after each use.   chlorhexidine 0.12 % solution Commonly known as:  PERIDEX Use as directed 5 mLs in the mouth or throat daily. SWISH AND SPIT 5ML FOR 30 SECONDS EVERYDAY INDEFINITELY   cholecalciferol 1000 units tablet Commonly known as:  VITAMIN D Take 1,000 Units by mouth daily.   Denta 5000 Plus 1.1 % Crea dental cream Generic drug:  sodium fluoride Place 1 application onto teeth at bedtime. Apply a thin ribbon   Humidifier Misc Please use humidifier to prevent excessive dryness.   HYDROcodone-acetaminophen 10-325 MG tablet Commonly known as:  NORCO Take 1 tablet by mouth SCHEDULED twice daily; may take an additional tablet once daily as needed for increased back pain.HOLD for sedation. What changed:  additional instructions Changed by:  May, Anita A, CMA   hydroxyurea 500 MG capsule Commonly known as:  HYDREA Take 500 mg by mouth daily. Sunday,Wednesday and Friday,alsotake 2 capsules=1000 mg on Monday. (Wear gloves when handling medication) May take with food to minimize GI side effects.   memantine 10 MG tablet Commonly known as:  NAMENDA Take 10 mg by mouth 2 (two) times daily.   metoprolol tartrate 25 MG tablet Commonly known as:  LOPRESSOR Take 25 mg by mouth 2 (two) times daily.   montelukast  10 MG tablet Commonly known as:  SINGULAIR Take 10 mg by mouth at bedtime.   NIFEdipine 10 MG capsule Commonly known as:  PROCARDIA Take 10 mg by mouth 3 (three) times daily before meals. Squeeze contents of one capsule under tongue 15-30 minutes before meals 3 times a day   nitroGLYCERIN 0.4 MG SL tablet Commonly known as:  NITROSTAT Place 0.4 mg under the tongue every 5 (five) minutes as needed for chest pain.   omeprazole 20 MG capsule Commonly known as:  PRILOSEC Take 20 mg by mouth at bedtime.   PreserVision AREDS 2 Caps Take 1 capsule by mouth 2 (two) times daily.   Rivaroxaban 15 MG Tabs tablet Commonly known as:  XARELTO Take 15 mg by mouth daily. 5pm  sertraline 25 MG tablet Commonly known as:  ZOLOFT Take 25 mg by mouth daily.   sodium chloride 0.65 % nasal spray Commonly known as:  OCEAN Place 1 spray into the nose 2 (two) times daily.      ROS was provided with assistance of staff Review of Systems  Constitutional: Negative for activity change, appetite change, chills, diaphoresis, fatigue, fever and unexpected weight change.  HENT: Positive for hearing loss. Negative for congestion and voice change.   Respiratory: Negative for cough, shortness of breath and wheezing.   Cardiovascular: Negative for chest pain, palpitations and leg swelling.  Gastrointestinal: Negative for abdominal pain, constipation, diarrhea, nausea and vomiting.  Genitourinary: Negative for difficulty urinating, dysuria and urgency.  Musculoskeletal: Positive for arthralgias, back pain and gait problem.  Skin: Negative for color change and pallor.  Neurological: Negative for dizziness, speech difficulty, weakness and headaches.       Dementia  Psychiatric/Behavioral: Negative for agitation, behavioral problems, hallucinations and sleep disturbance. The patient is not nervous/anxious.     Immunization History  Administered Date(s) Administered  . Influenza Split 11/16/2010, 12/13/2011   . Influenza Whole 11/27/2009, 11/29/2017  . Influenza-Unspecified 12/31/2013, 11/18/2014, 12/14/2015, 12/11/2016  . PPD Test 09/11/2012  . Pneumococcal Conjugate-13 06/01/2017  . Pneumococcal Polysaccharide-23 02/28/1988  . Tdap 05/31/2017   Pertinent  Health Maintenance Due  Topic Date Due  . INFLUENZA VACCINE  09/28/2018  . DEXA SCAN  Completed  . PNA vac Low Risk Adult  Completed   Fall Risk  02/08/2017 02/08/2017 10/04/2015 12/28/2014 10/23/2014  Falls in the past year? No Exclusion - non ambulatory No Yes Yes  Number falls in past yr: - - - 1 1  Injury with Fall? - - - No No  Risk for fall due to : - Impaired balance/gait;Impaired vision Impaired mobility;Impaired balance/gait History of fall(s);Impaired balance/gait;Impaired mobility Impaired mobility  Follow up - - - Falls prevention discussed Falls evaluation completed   Functional Status Survey:    Vitals:   07/29/18 1007  BP: 110/74  Pulse: 65  Resp: 18  Temp: 97.8 F (36.6 C)  SpO2: 93%  Weight: 101 lb 12.8 oz (46.2 kg)  Height: 5\' 1"  (1.549 m)   Body mass index is 19.23 kg/m. Physical Exam Constitutional:      General: She is not in acute distress.    Appearance: Normal appearance. She is normal weight. She is not ill-appearing, toxic-appearing or diaphoretic.  HENT:     Head: Normocephalic and atraumatic.     Nose: Nose normal.     Mouth/Throat:     Mouth: Mucous membranes are moist.  Eyes:     Extraocular Movements: Extraocular movements intact.     Conjunctiva/sclera: Conjunctivae normal.     Pupils: Pupils are equal, round, and reactive to light.  Neck:     Musculoskeletal: Normal range of motion and neck supple.  Cardiovascular:     Rate and Rhythm: Normal rate and regular rhythm.     Heart sounds: No murmur.  Pulmonary:     Effort: Pulmonary effort is normal.     Breath sounds: No wheezing, rhonchi or rales.  Chest:     Chest wall: No tenderness.  Abdominal:     General: There is no  distension.     Palpations: Abdomen is soft.     Tenderness: There is no abdominal tenderness. There is no right CVA tenderness, left CVA tenderness, guarding or rebound.  Musculoskeletal:     Right lower leg: No edema.  Left lower leg: No edema.     Comments: W/c for mobility.   Skin:    General: Skin is warm and dry.  Neurological:     General: No focal deficit present.     Mental Status: She is alert. Mental status is at baseline.     Cranial Nerves: No cranial nerve deficit.     Motor: No weakness.     Coordination: Coordination normal.     Gait: Gait abnormal.     Comments: Oriented to person and place.   Psychiatric:        Mood and Affect: Mood normal.        Behavior: Behavior normal.     Labs reviewed: Recent Labs    09/18/17 11/21/17 04/30/18  NA 142 145 140  K 3.8 4.0 4.5  CL 104 111 104  CO2 30 29 29   BUN 21 17 19   CREATININE 0.8 0.7 0.7  CALCIUM 9.4 8.5 8.9  MG 1.9  --   --    Recent Labs    09/18/17 11/21/17 04/30/18  AST 19 12* 16  ALT 13 10 9   ALKPHOS 71 66 70  PROT 6.8 5.1 5.8  ALBUMIN 4.2 3.2 3.4   Recent Labs    11/21/17 01/01/18 04/30/18  WBC 5.7 5.6 6.3  HGB 11.2* 11.6* 11.7*  HCT 33* 34* 35*  PLT 333 292 311   Lab Results  Component Value Date   TSH 2.00 09/21/2016   Lab Results  Component Value Date   HGBA1C 5.5 10/16/2014   Lab Results  Component Value Date   CHOL 143 09/18/2017   HDL 50 09/18/2017   LDLCALC 70 09/18/2017   LDLDIRECT 152.3 06/04/2009   TRIG 147 09/18/2017   CHOLHDL 3 09/13/2011    Significant Diagnostic Results in last 30 days:  No results found.  Assessment/Plan  Essential hypertension, benign Blood pressure is controlled, continue Metoprolol 25mg  bid.   GERD Stable, continue Omeprazole 20mg  qd, Nifedipine 10mg  ac meals.   Alzheimer's dementia without behavioral disturbance ([place]) Continue SNF [facility] for safety and care assistance, continue Memantine 10mg  bid for memory.   Essential  thrombocythemia ([place]) Controlled, continue Hydroxyurea 500mg  qd.   Chronic depression Her mood is stable, continue Sertraline 25mg  qd.   History of DVT (deep vein thrombosis) Long term anticoagulation, continue Rivaroxaban 15mg  qd.   BACK PAIN, LUMBAR Controlled lower back pain, continue Norco 10/325mg  bid, prn Tylenol 650mg  q8hr, w/c for mobility.    Family/ staff Communication: plan of care reviewed with the patient and charge nurse.   Labs/tests ordered:  None  Time spend 25 minutes.

## 2018-07-29 NOTE — Telephone Encounter (Signed)
Received fax request from [facility].  Pended Rx and sent to [facility] for approval.

## 2018-07-29 NOTE — Addendum Note (Signed)
Addended by: Rafael Bihari A on: 07/29/2018 11:06 AM   Modules accepted: Orders

## 2018-07-29 NOTE — Telephone Encounter (Signed)
Received a Failed Fax Refill.  Pended and resent to [facility] due to Fax Failure.

## 2018-07-30 ENCOUNTER — Other Ambulatory Visit: Payer: Self-pay

## 2018-07-30 ENCOUNTER — Other Ambulatory Visit: Payer: Self-pay | Admitting: Internal Medicine

## 2018-07-30 ENCOUNTER — Encounter: Payer: Self-pay | Admitting: Oncology

## 2018-07-30 ENCOUNTER — Encounter: Payer: Self-pay | Admitting: Nurse Practitioner

## 2018-07-30 DIAGNOSIS — M5440 Lumbago with sciatica, unspecified side: Secondary | ICD-10-CM

## 2018-07-30 DIAGNOSIS — I10 Essential (primary) hypertension: Secondary | ICD-10-CM | POA: Diagnosis not present

## 2018-07-30 LAB — BASIC METABOLIC PANEL
BUN: 21 (ref 4–21)
Creatinine: 0.6 (ref 0.5–1.1)
Glucose: 97
Potassium: 3.5 (ref 3.4–5.3)
Sodium: 143 (ref 137–147)

## 2018-07-30 LAB — HEPATIC FUNCTION PANEL
ALT: 7 (ref 7–35)
AST: 14 (ref 13–35)

## 2018-07-30 LAB — CBC AND DIFFERENTIAL
HCT: 36 (ref 36–46)
Hemoglobin: 12.4 (ref 12.0–16.0)
Platelets: 252 (ref 150–399)

## 2018-07-30 MED ORDER — HYDROCODONE-ACETAMINOPHEN 10-325 MG PO TABS: ORAL_TABLET | ORAL | 0 refills | Status: DC

## 2018-07-30 NOTE — Assessment & Plan Note (Signed)
Her mood is stable, continue Sertraline 25mg  qd.

## 2018-07-30 NOTE — Assessment & Plan Note (Signed)
Long term anticoagulation, continue Rivaroxaban 15mg  qd.

## 2018-07-30 NOTE — Assessment & Plan Note (Signed)
Continue SNF [facility] for safety and care assistance, continue Memantine 10mg  bid for memory.

## 2018-07-30 NOTE — Assessment & Plan Note (Signed)
Controlled, continue Hydroxyurea 500mg  qd.

## 2018-07-30 NOTE — Assessment & Plan Note (Signed)
Controlled lower back pain, continue Norco 10/325mg  bid, prn Tylenol 650mg  q8hr, w/c for mobility.

## 2018-07-30 NOTE — Assessment & Plan Note (Signed)
Stable, continue Omeprazole 20mg  qd, Nifedipine 10mg  ac meals.

## 2018-07-30 NOTE — Telephone Encounter (Signed)
RX not received yesterday.

## 2018-07-30 NOTE — Assessment & Plan Note (Signed)
Blood pressure is controlled, continue Metoprolol 25mg bid.  

## 2018-07-31 NOTE — Telephone Encounter (Signed)
Transmission to pharmacy failed Resent Rx to [facility] to refax to pharmacy.

## 2018-07-31 NOTE — Addendum Note (Signed)
Addended by: Rafael Bihari A on: 07/31/2018 02:43 PM   Modules accepted: Orders

## 2018-08-01 MED ORDER — HYDROCODONE-ACETAMINOPHEN 10-325 MG PO TABS: ORAL_TABLET | ORAL | 0 refills | Status: DC

## 2018-08-19 ENCOUNTER — Encounter: Payer: Self-pay | Admitting: Nurse Practitioner

## 2018-08-19 ENCOUNTER — Non-Acute Institutional Stay (SKILLED_NURSING_FACILITY): Payer: Medicare Other | Admitting: Nurse Practitioner

## 2018-08-19 DIAGNOSIS — B351 Tinea unguium: Secondary | ICD-10-CM | POA: Diagnosis not present

## 2018-08-19 DIAGNOSIS — M2041 Other hammer toe(s) (acquired), right foot: Secondary | ICD-10-CM | POA: Diagnosis not present

## 2018-08-19 DIAGNOSIS — I739 Peripheral vascular disease, unspecified: Secondary | ICD-10-CM | POA: Diagnosis not present

## 2018-08-19 DIAGNOSIS — G301 Alzheimer's disease with late onset: Secondary | ICD-10-CM

## 2018-08-19 DIAGNOSIS — M2042 Other hammer toe(s) (acquired), left foot: Secondary | ICD-10-CM

## 2018-08-19 DIAGNOSIS — F0280 Dementia in other diseases classified elsewhere without behavioral disturbance: Secondary | ICD-10-CM

## 2018-08-19 NOTE — Assessment & Plan Note (Signed)
Continue SNF [facility] for safety and care assistance, w/c for mobility, continue Memantine 10mg  bid for memory.

## 2018-08-19 NOTE — Assessment & Plan Note (Signed)
R+L 2nd toes, open toe shoes.

## 2018-08-19 NOTE — Progress Notes (Signed)
Location:   SNF [place] Room Number: 35/A Place of Service:  SNF (31) Provider: [facility]  NP  Virgie Dad, MD  Patient Care Team: Virgie Dad, MD as PCP - General (Internal Medicine) Annia Belt, MD as Consulting Physician (Hematology and Oncology) Suella Broad, MD as Consulting Physician (Physical Medicine and Rehabilitation) Noralee Space, MD (Pulmonary Disease) [place] ,  X, NP as Nurse Practitioner (Nurse Practitioner) Inda Castle, MD (Inactive) as Consulting Physician (Gastroenterology)  Extended Emergency Contact Information Primary Emergency Contact: Fifita,Gene Address: [address]          [place], [place] [address] Johnnette Litter of [place] Phone: [phone] Work Phone: [phone] Mobile Phone: [phone] Relation: Son Secondary Emergency Contact: [name], [place] [address] Johnnette Litter of [place] Phone: [phone] Relation: Son  Code Status: DNR Goals of care: Advanced Directive information Advanced Directives 08/19/2018  Does Patient Have a Medical Advance Directive? Yes  Type of Paramedic of [place];Living will;Out of facility DNR (pink MOST or yellow form)  Does patient want to make changes to medical advance directive? No - Patient declined  Copy of [place] in Chart? Yes - validated most recent copy scanned in chart (See row information)  Pre-existing out of facility DNR order (yellow form or pink MOST form) Yellow form placed in chart (order not valid for inpatient use)     Chief Complaint  Patient presents with  . Acute Visit    Toe red     HPI:  Pt is a 85 y.o. female seen today for an acute visit for reported the right 2nd toe, nail bed red, irritated, mycotic nails. The patient has mid swelling BLE, R>L, PDA, multiple thickened toe nails, R+L 2nd toe-hammer toes, top of these toes irritated from tight  fit shoes. No nail bed redness or irritation noted. HPI was provided with assistance of staff. [facility] resides in [facility] for safety and care assistance, on Memantine 10mg  bid for memory, w/c for mobility.    Past Medical History:  Diagnosis Date  . Abnormal weight gain 05/09/2012  . Achalasia of esophagus 09/18/2013   09/18/13 Nifedipine 10mg  tid ac meals. 11/17/14 [facility] f/u as neeed   . Anxiety and depression 03/12/2012   Increased Sertraline to 50mg  daily 07/16/13 08/26/13 Mirtazapine 7.5mg  qhs  10/12/14 Pharm decreased Sertraline to 25mg .  05/04/15 TSH 2.89 09/15/15 pharm taper off Sertraline.  10/08/15 continue Sertraline 25mg  daily, POA desires. 12/02/15 wbc 6.1, Hgb 12.0, plt 271    . Asthmatic bronchitis   . BACK PAIN, LUMBAR 03/12/2007   Hx of lower back pain, had X-ray of her lumbar spine 09/08/12--spondylosis-better controlled with Hydrocodone/ApAp  10/325mg  ac and hs  01/01/13 lumbar spine inj. Able to ambulate with walker on unit until her fall 06/22/13. 06/25/13 unremarkable. X-ray lumbar spine. 11/10/13 dc Oxycodone prn-not used in 60 days.     . COLONIC POLYPS 03/12/2007   Initially noted April 2002 colonoscopy. Followup colonoscopy 05/31/2006 did not disclose any further polyps.    Marland Kitchen COPD (chronic obstructive pulmonary disease) ([place])   . Coronary artery disease   . Deep vein thrombosis of left lower extremity ([place]) 07/19/2012  . Dementia ([place]) 09/16/2012   05/04/15 TSH 2.89   . Depression   . Diverticulosis 05/10/2012  . DJD (degenerative joint disease)   . Dry skin dermatitis 11/18/2012  . Edema of leg  07/01/2010  . Epistaxis 01/24/2016   01/21/16: x2, held Xarelto x 1 day, Saline Nasal spray prn, wbc 9.5, Hgb 12.4, Plt 331, Na 138, K 4.5, Bun 25, creat 0.89 01/22/16 AOZ30(86-57)  . Esophageal dysmotility 07/26/2013  . Esophageal dysphagia 05/27/2010   New dx of achalasia 6/ 2015 09/18/13 Nifedipine 10mg  tid ac meals.    . Esophageal stricture   . Essential hypertension, benign 10/30/2013   12/02/15  wbc 6.1, Hgb 12.0, plt 271   . Essential thrombocythemia ([place]) 06/09/2009  . Fall 06/25/2013  . GERD (gastroesophageal reflux disease)   . Hip fracture ([place]) 09/08/2012  . HYPERCHOLESTEROLEMIA 03/12/2007   08/10/15 cholesterol 119, triglycerides 143, HDL 51, LDL 39   . Lumbar back pain   . MACULAR DEGENERATION 01/26/2008   Qualifier: Diagnosis of  By: Lenna [place] MD, Deborra Medina   . Osteoarthritis 03/12/2007  . Osteoporosis 03/12/2007   Qualifier: Diagnosis of  By: Julien Girt CMA, Marliss Czar  06/15/14 dc Vit D 1000u po daily per pharm.    . Phlebitis and thrombophlebitis of other deep vessels of lower extremities 07/10/2010  . S/P balloon dilatation of esophageal stricture 07/28/2013   07/26/13. Dr. Henrene Pastor. EGD foreign removal and Balloon dilation of esophagus. Esophageal dysmotility, suspected achalasia, dilation at the GE junction to 17mm. Candida esophagitis.    . SYNCOPE 07/30/2009   Occurred 2011. Hospitalized. Felt due to dehydration.    . TRANSIENT ISCHEMIC ATTACKS, HX OF 03/12/2007   Qualifier: Diagnosis of  By: Julien Girt CMA, Marliss Czar     Past Surgical History:  Procedure Laterality Date  . ABDOMINAL HYSTERECTOMY     and bso  . APPENDECTOMY    . BOTOX INJECTION N/A 09/18/2013   Procedure: BOTOX INJECTION;  Surgeon: [place] Dragon, MD;  Location: [facility] ENDOSCOPY;  Service: Endoscopy;  Laterality: N/A;  . BOTOX INJECTION N/A 10/16/2014   Procedure: BOTOX INJECTION;  Surgeon: Inda Castle, MD;  Location: [facility];  Service: Endoscopy;  Laterality: N/A;  . BOTOX INJECTION N/A 05/18/2016   Procedure: BOTOX INJECTION;  Surgeon: Milus Banister, MD;  Location: [facility];  Service: Endoscopy;  Laterality: N/A;  . BOTOX INJECTION N/A 02/14/2018   Procedure: BOTOX INJECTION;  Surgeon: Milus Banister, MD;  Location: [facility];  Service: Endoscopy;  Laterality: N/A;  . CATARACT EXTRACTION W/ INTRAOCULAR LENS  IMPLANT, BILATERAL    . CHOLECYSTECTOMY    . ESOPHAGEAL MANOMETRY N/A 08/25/2013   Procedure: ESOPHAGEAL  MANOMETRY (EM);  Surgeon: Jerene Bears, MD;  Location: [facility];  Service: Gastroenterology;  Laterality: N/A;  . ESOPHAGOGASTRODUODENOSCOPY N/A 07/26/2013   Procedure: ESOPHAGOGASTRODUODENOSCOPY (EGD);  Surgeon: Irene Shipper, MD;  [facility] ENDOSCOPY;  Service: Endoscopy;  Laterality: N/A;  . ESOPHAGOGASTRODUODENOSCOPY N/A 10/16/2014   Procedure: ESOPHAGOGASTRODUODENOSCOPY (EGD);  Surgeon: Inda Castle, MD;  Location: [facility];  Service: Endoscopy;  Laterality: N/A;  with botox  . ESOPHAGOGASTRODUODENOSCOPY (EGD) WITH ESOPHAGEAL DILATION N/A 11/25/2012   Procedure: ESOPHAGOGASTRODUODENOSCOPY (EGD) WITH ESOPHAGEAL DILATION;  Surgeon: Jerene Bears, MD;  [facility]: [facility];  Service: Gastroenterology;  Laterality: N/A;  . ESOPHAGOGASTRODUODENOSCOPY (EGD) WITH PROPOFOL N/A 09/18/2013   Procedure: ESOPHAGOGASTRODUODENOSCOPY (EGD) WITH PROPOFOL;  Surgeon: [place] Dragon, MD;  Location: [facility];  Service: Endoscopy;  Laterality: N/A;  . ESOPHAGOGASTRODUODENOSCOPY (EGD) WITH PROPOFOL N/A 05/18/2016   Procedure: ESOPHAGOGASTRODUODENOSCOPY (EGD) WITH PROPOFOL;  Surgeon: Milus Banister, MD;  Location: [facility];  Service: Endoscopy;  Laterality: N/A;  . ESOPHAGOGASTRODUODENOSCOPY (EGD) WITH PROPOFOL N/A 02/14/2018   Procedure: ESOPHAGOGASTRODUODENOSCOPY (EGD)  WITH PROPOFOL;  Surgeon: Milus Banister, MD;  Location: [facility];  Service: Endoscopy;  Laterality: N/A;  . HIP ARTHROPLASTY Right 09/09/2012   Procedure: ARTHROPLASTY BIPOLAR HIP;  Surgeon: Mauri Pole, MD;  Location: [facility];  Service: Orthopedics;  Laterality: Right;  . TAH and BSO  08/21/1995    Allergies  Allergen Reactions  . Sulfamethoxazole Rash    Allergies as of 08/19/2018      Reactions   Sulfamethoxazole Rash      Medication List       Accurate as of August 19, 2018  1:37 PM. If you have any questions, ask your nurse or doctor.        acetaminophen 325 MG tablet Commonly known as: TYLENOL Take 650  mg by mouth every 8 (eight) hours as needed for moderate pain.   albuterol 108 (90 Base) MCG/ACT inhaler Commonly known as: VENTOLIN HFA Inhale 2 puffs into the lungs every 6 (six) hours as needed for wheezing or shortness of breath.   atorvastatin 10 MG tablet Commonly known as: LIPITOR Take 10 mg by mouth at bedtime.   Breo Ellipta 100-25 MCG/INH Aepb Generic drug: fluticasone furoate-vilanterol Inhale 1 puff into the lungs daily. One puff inhale once a day, rinse mouth after each use.   chlorhexidine 0.12 % solution Commonly known as: PERIDEX Use as directed 5 mLs in the mouth or throat daily. SWISH AND SPIT 5ML FOR 30 SECONDS EVERYDAY INDEFINITELY   cholecalciferol 1000 units tablet Commonly known as: VITAMIN D Take 1,000 Units by mouth daily.   Denta 5000 Plus 1.1 % Crea dental cream Generic drug: sodium fluoride Place 1 application onto teeth at bedtime. Apply a thin ribbon   Humidifier Misc Please use humidifier to prevent excessive dryness.   HYDROcodone-acetaminophen 10-325 MG tablet Commonly known as: NORCO Take 1 tablet by mouth SCHEDULED twice daily; may take an additional tablet once daily as needed for increased back pain.HOLD for sedation.   hydroxyurea 500 MG capsule Commonly known as: HYDREA Take 500 mg by mouth daily. Sunday,Wednesday and Friday,alsotake 2 capsules=1000 mg on Monday. (Wear gloves when handling medication) May take with food to minimize GI side effects.   memantine 10 MG tablet Commonly known as: NAMENDA Take 10 mg by mouth 2 (two) times daily.   metoprolol tartrate 25 MG tablet Commonly known as: LOPRESSOR Take 25 mg by mouth 2 (two) times daily.   montelukast 10 MG tablet Commonly known as: SINGULAIR Take 10 mg by mouth at bedtime.   NIFEdipine 10 MG capsule Commonly known as: PROCARDIA Take 10 mg by mouth 3 (three) times daily before meals. Squeeze contents of one capsule under tongue 15-30 minutes before meals 3 times a day    nitroGLYCERIN 0.4 MG SL tablet Commonly known as: NITROSTAT Place 0.4 mg under the tongue every 5 (five) minutes as needed for chest pain.   omeprazole 20 MG capsule Commonly known as: PRILOSEC Take 20 mg by mouth at bedtime.   PreserVision AREDS 2 Caps Take 1 capsule by mouth 2 (two) times daily.   Rivaroxaban 15 MG Tabs tablet Commonly known as: XARELTO Take 15 mg by mouth daily. 5pm   sertraline 25 MG tablet Commonly known as: ZOLOFT Take 25 mg by mouth daily.   sodium chloride 0.65 % nasal spray Commonly known as: OCEAN Place 1 spray into the nose 2 (two) times daily.     ROS was provided with assistance of staff.   Review of Systems  Constitutional: Negative  for activity change, appetite change, chills, diaphoresis, fatigue and unexpected weight change.  HENT: Positive for hearing loss. Negative for congestion and voice change.   Respiratory: Negative for cough and shortness of breath.   Cardiovascular: Positive for leg swelling. Negative for chest pain and palpitations.  Gastrointestinal: Negative for abdominal distention and abdominal pain.  Genitourinary: Negative for difficulty urinating, dysuria and urgency.  Musculoskeletal: Positive for arthralgias, back pain and gait problem.  Skin:       Thickened toe nails. Mild erythematous right ankle/foot. R+L 2nd hammer toes, top of the toes are mild erythematous from ill fit shoes.   Neurological: Negative for dizziness, speech difficulty, weakness and headaches.       Dementia  Psychiatric/Behavioral: Negative for agitation, behavioral problems, hallucinations and sleep disturbance. The patient is not nervous/anxious.     Immunization History  Administered Date(s) Administered  . Influenza Split 11/16/2010, 12/13/2011  . Influenza Whole 11/27/2009, 11/29/2017  . Influenza-Unspecified 12/31/2013, 11/18/2014, 12/14/2015, 12/11/2016  . PPD Test 09/11/2012  . Pneumococcal Conjugate-13 06/01/2017  . Pneumococcal  Polysaccharide-23 02/28/1988  . Tdap 05/31/2017   Pertinent  Health Maintenance Due  Topic Date Due  . INFLUENZA VACCINE  09/28/2018  . DEXA SCAN  Completed  . PNA vac Low Risk Adult  Completed   Fall Risk  02/08/2017 02/08/2017 10/04/2015 12/28/2014 10/23/2014  Falls in the past year? No Exclusion - non ambulatory No Yes Yes  Number falls in past yr: - - - 1 1  Injury with Fall? - - - No No  Risk for fall due to : - Impaired balance/gait;Impaired vision Impaired mobility;Impaired balance/gait History of fall(s);Impaired balance/gait;Impaired mobility Impaired mobility  Follow up - - - Falls prevention discussed Falls evaluation completed   Functional Status Survey:    Vitals:   08/19/18 1247  BP: 120/60  Pulse: 72  Resp: 20  Temp: (!) 97.5 F (36.4 C)  SpO2: 95%  Weight: 101 lb 12.8 oz (46.2 kg)  Height: 5\' 1"  (1.549 m)   Body mass index is 19.23 kg/m. Physical Exam Constitutional:      General: She is not in acute distress.    Appearance: Normal appearance. She is not ill-appearing, toxic-appearing or diaphoretic.  HENT:     Head: Normocephalic and atraumatic.     Nose: Nose normal. No congestion.     Mouth/Throat:     Mouth: Mucous membranes are moist.  Eyes:     Extraocular Movements: Extraocular movements intact.     Pupils: Pupils are equal, round, and reactive to light.  Neck:     Musculoskeletal: Normal range of motion and neck supple.  Cardiovascular:     Rate and Rhythm: Normal rate and regular rhythm.     Heart sounds: No murmur.  Pulmonary:     Effort: Pulmonary effort is normal.     Breath sounds: Rhonchi present. No wheezing or rales.  Abdominal:     General: Bowel sounds are normal. There is no distension.     Palpations: Abdomen is soft.     Tenderness: There is no abdominal tenderness. There is no right CVA tenderness, left CVA tenderness, guarding or rebound.  Musculoskeletal:     Right lower leg: Edema present.     Left lower leg: Edema  present.     Comments: Trace edema BLE, R>L. W/c for mobility.   Skin:    General: Skin is warm and dry.     Findings: Erythema present.     Comments: Thickened toe nails.  Mild erythematous right ankle/foot. R+L 2nd hammer toes, top of the toes are mild erythematous from ill fit shoes.   Neurological:     General: No focal deficit present.     Mental Status: She is alert. Mental status is at baseline.     Cranial Nerves: No cranial nerve deficit.     Motor: No weakness.     Coordination: Coordination normal.     Gait: Gait abnormal.     Deep Tendon Reflexes: Reflexes normal.     Comments: Oriented to person and place.   Psychiatric:        Mood and Affect: Mood normal.        Behavior: Behavior normal.     Labs reviewed: Recent Labs    09/18/17 11/21/17 04/30/18 07/30/18  NA 142 145 140 143  K 3.8 4.0 4.5 3.5  CL 104 111 104  --   CO2 30 29 29   --   BUN 21 17 19 21   CREATININE 0.8 0.7 0.7 0.6  CALCIUM 9.4 8.5 8.9  --   MG 1.9  --   --   --    Recent Labs    09/18/17 11/21/17 04/30/18 07/30/18  AST 19 12* 16 14  ALT 13 10 9 7   ALKPHOS 71 66 70  --   PROT 6.8 5.1 5.8  --   ALBUMIN 4.2 3.2 3.4  --    Recent Labs    11/21/17 01/01/18 04/30/18 07/30/18  WBC 5.7 5.6 6.3  --   HGB 11.2* 11.6* 11.7* 12.4  HCT 33* 34* 35* 36  PLT 333 292 311 252   Lab Results  Component Value Date   TSH 2.00 09/21/2016   Lab Results  Component Value Date   HGBA1C 5.5 10/16/2014   Lab Results  Component Value Date   CHOL 143 09/18/2017   HDL 50 09/18/2017   LDLCALC 70 09/18/2017   LDLDIRECT 152.3 06/04/2009   TRIG 147 09/18/2017   CHOLHDL 3 09/13/2011    Significant Diagnostic Results in last 30 days:  No results found.  Assessment/Plan: Hammer toes, bilateral R+L 2nd toes, open toe shoes.   Onychomycosis Thick toe nails, no complication, observe.   PAD (peripheral artery disease) ([place]) 02/26/18 ABI R 0.58, L 0.44-moderate arterial disease in the right lower  extremity, severe arterial disease in the left lower extremity.  03/08/18 cardiology-not recommending further testing.  Chronic right ankle foot mild swelling, erythematous, no open wounds. Observe.   Alzheimer's dementia without behavioral disturbance ([place]) Continue SNF [facility] for safety and care assistance, w/c for mobility, continue Memantine 10mg  bid for memory.     Family/ staff Communication: plan of care reviewed with the patient and charge nurse.   Labs/tests ordered: none  Time spend 25 minutes.

## 2018-08-19 NOTE — Assessment & Plan Note (Signed)
02/26/18 ABI R 0.58, L 0.44-moderate arterial disease in the right lower extremity, severe arterial disease in the left lower extremity.  03/08/18 cardiology-not recommending further testing.  Chronic right ankle foot mild swelling, erythematous, no open wounds. Observe.

## 2018-08-19 NOTE — Assessment & Plan Note (Signed)
Thick toe nails, no complication, observe.

## 2018-08-23 ENCOUNTER — Other Ambulatory Visit: Payer: Self-pay | Admitting: *Deleted

## 2018-08-23 DIAGNOSIS — M5440 Lumbago with sciatica, unspecified side: Secondary | ICD-10-CM

## 2018-08-23 MED ORDER — HYDROCODONE-ACETAMINOPHEN 10-325 MG PO TABS: ORAL_TABLET | ORAL | 0 refills | Status: DC

## 2018-08-23 NOTE — Telephone Encounter (Signed)
Received fax request from [facility] and sent to Dr. Lyndel Safe for approval.

## 2018-09-07 DIAGNOSIS — Z1159 Encounter for screening for other viral diseases: Secondary | ICD-10-CM | POA: Diagnosis not present

## 2018-09-10 DIAGNOSIS — M79671 Pain in right foot: Secondary | ICD-10-CM | POA: Diagnosis not present

## 2018-09-10 DIAGNOSIS — Q6689 Other  specified congenital deformities of feet: Secondary | ICD-10-CM | POA: Diagnosis not present

## 2018-09-10 DIAGNOSIS — M79672 Pain in left foot: Secondary | ICD-10-CM | POA: Diagnosis not present

## 2018-09-10 DIAGNOSIS — B351 Tinea unguium: Secondary | ICD-10-CM | POA: Diagnosis not present

## 2018-09-24 ENCOUNTER — Other Ambulatory Visit: Payer: Self-pay | Admitting: *Deleted

## 2018-09-24 DIAGNOSIS — M5440 Lumbago with sciatica, unspecified side: Secondary | ICD-10-CM

## 2018-09-24 MED ORDER — HYDROCODONE-ACETAMINOPHEN 10-325 MG PO TABS: ORAL_TABLET | ORAL | 0 refills | Status: DC

## 2018-09-24 NOTE — Telephone Encounter (Signed)
Received fax order from [facility] and sent to [facility] for approval.

## 2018-09-25 DIAGNOSIS — Z03818 Encounter for observation for suspected exposure to other biological agents ruled out: Secondary | ICD-10-CM | POA: Diagnosis not present

## 2018-10-02 DIAGNOSIS — Z1159 Encounter for screening for other viral diseases: Secondary | ICD-10-CM | POA: Diagnosis not present

## 2018-10-03 ENCOUNTER — Other Ambulatory Visit: Payer: Medicare Other

## 2018-10-03 ENCOUNTER — Ambulatory Visit: Payer: Medicare Other | Admitting: Hematology

## 2018-10-09 DIAGNOSIS — Z1159 Encounter for screening for other viral diseases: Secondary | ICD-10-CM | POA: Diagnosis not present

## 2018-10-22 ENCOUNTER — Encounter: Payer: Self-pay | Admitting: Internal Medicine

## 2018-10-22 ENCOUNTER — Non-Acute Institutional Stay (SKILLED_NURSING_FACILITY): Payer: Medicare Other | Admitting: Internal Medicine

## 2018-10-22 DIAGNOSIS — J449 Chronic obstructive pulmonary disease, unspecified: Secondary | ICD-10-CM | POA: Diagnosis not present

## 2018-10-22 DIAGNOSIS — I10 Essential (primary) hypertension: Secondary | ICD-10-CM

## 2018-10-22 DIAGNOSIS — E785 Hyperlipidemia, unspecified: Secondary | ICD-10-CM

## 2018-10-22 DIAGNOSIS — D473 Essential (hemorrhagic) thrombocythemia: Secondary | ICD-10-CM

## 2018-10-22 NOTE — Progress Notes (Signed)
Location:  [facility] Room Number: 35 Place of Service:  SNF [phone]) Provider: Veleta Miners  MD  Virgie Dad, MD  Patient Care Team: Virgie Dad, MD as PCP - General (Internal Medicine) Annia Belt, MD as Consulting Physician (Hematology and Oncology) Suella Broad, MD as Consulting Physician (Physical Medicine and Rehabilitation) Noralee Space, MD (Pulmonary Disease) [name], [name], NP as Nurse Practitioner (Nurse Practitioner) Inda Castle, MD (Inactive) as Consulting Physician (Gastroenterology)  Extended Emergency Contact Information Primary Emergency Contact: Wierzbicki,Gene Address: [address]          [place], [place] [address] [place] Phone: [phone] Work Phone: [phone] Mobile Phone: [phone] Relation: Son Secondary Emergency Contact: [name], [place] [address] [place] of [place] Phone: [phone] Relation: Son  Code Status:  DNR Goals of care: Advanced Directive information Advanced Directives 10/22/2018  Does Patient Have a Medical Advance Directive? Yes  Type of Paramedic of [place];Living will;Out of facility DNR (pink MOST or yellow form)  Does patient want to make changes to medical advance directive? No - Patient declined  Copy of [place] in Chart? Yes - validated most recent copy scanned in chart (See row information)  Pre-existing out of facility DNR order (yellow form or pink MOST form) Yellow form placed in chart (order not valid for inpatient use);Pink MOST form placed in chart (order not valid for inpatient use)     Chief Complaint  Patient presents with  . Medical Management of Chronic Issues    HPI:  Pt is a 85 y.o. female seen today for medical management of chronic diseases.    She is Long term resident of facility She has h/o Hypertension,Essential  Thrombocythemia, H/o Esophageal Stricture, Achalasia,DVT,  Hyperlipidemia, Arthritis, Cognitive Impairement, COPD Patient is doing well in facility. Was very Pleasant.Seems in Good Mood. Did not have any Complains. Per Nurses no New issues Her weight is stable. Appetite is Good. Walks with walker and mild assist     Past Medical History:  Diagnosis Date  . Abnormal weight gain 05/09/2012  . Achalasia of esophagus 09/18/2013   09/18/13 Nifedipine 10mg  tid ac meals. 11/17/14 [facility] f/u as neeed   . Anxiety and depression 03/12/2012   Increased Sertraline to 50mg  daily 07/16/13 08/26/13 Mirtazapine 7.5mg  qhs  10/12/14 Pharm decreased Sertraline to 25mg .  05/04/15 TSH 2.89 09/15/15 pharm taper off Sertraline.  10/08/15 continue Sertraline 25mg  daily, POA desires. 12/02/15 wbc 6.1, Hgb 12.0, plt 271    . Asthmatic bronchitis   . BACK PAIN, LUMBAR 03/12/2007   Hx of lower back pain, had X-ray of her lumbar spine 09/08/12--spondylosis-better controlled with Hydrocodone/ApAp  10/325mg  ac and hs  01/01/13 lumbar spine inj. Able to ambulate with walker on unit until her fall 06/22/13. 06/25/13 unremarkable. X-ray lumbar spine. 11/10/13 dc Oxycodone prn-not used in 60 days.     . COLONIC POLYPS 03/12/2007   Initially noted April 2002 colonoscopy. Followup colonoscopy 05/31/2006 did not disclose any further polyps.    Marland Kitchen COPD (chronic obstructive pulmonary disease) ([place])   . Coronary artery disease   . Deep vein thrombosis of left lower extremity ([place]) 07/19/2012  . Dementia ([place]) 09/16/2012   05/04/15 TSH 2.89   . Depression   . Diverticulosis 05/10/2012  . DJD (degenerative joint disease)   . Dry skin dermatitis 11/18/2012  .  Edema of leg 07/01/2010  . Epistaxis 01/24/2016   01/21/16: x2, held Xarelto x 1 day, Sal[place] Nasal spray prn, wbc 9.5, Hgb 12.4, Plt 331, Na 138, K 4.5, Bun 25, creat 0.89 01/22/16 [id number])  . Esophageal dysmotility 07/26/2013  . Esophageal dysphagia 05/27/2010   New dx of achalasia 6/ 2015  09/18/13 Nifedipine 10mg  tid ac meals.    . Esophageal stricture   . Essential hypertension, benign 10/30/2013   12/02/15 wbc 6.1, Hgb 12.0, plt 271   . Essential thrombocythemia ([place]) 06/09/2009  . Fall 06/25/2013  . GERD (gastroesophageal reflux disease)   . Hip fracture ([place]) 09/08/2012  . HYPERCHOLESTEROLEMIA 03/12/2007   08/10/15 cholesterol 119, triglycerides 143, HDL 51, LDL 39   . Lumbar back pain   . MACULAR DEGENERATION 01/26/2008   Qualifier: Diagnosis of  By: Lenna Gilford MD, Deborra Medina   . Osteoarthritis 03/12/2007  . Osteoporosis 03/12/2007   Qualifier: Diagnosis of  By: Julien Girt CMA, Marliss Czar  06/15/14 dc Vit D 1000u po daily per pharm.    . Phlebitis and thrombophlebitis of other deep vessels of lower extremities 07/10/2010  . S/P balloon dilatation of esophageal stricture 07/28/2013   07/26/13. Dr. Henrene Pastor. EGD foreign removal and Balloon dilation of esophagus. Esophageal dysmotility, suspected achalasia, dilation at the GE junction to 57mm. Candida esophagitis.    . SYNCOPE 07/30/2009   Occurred 2011. Hospitalized. Felt due to dehydration.    . TRANSIENT ISCHEMIC ATTACKS, HX OF 03/12/2007   Qualifier: Diagnosis of  By: Julien Girt CMA, Marliss Czar     Past Surgical History:  Procedure Laterality Date  . ABDOMINAL HYSTERECTOMY     and bso  . APPENDECTOMY    . BOTOX INJECTION N/A 09/18/2013   Procedure: BOTOX INJECTION;  Surgeon: Lafayette Dragon, MD;  Location: [facility];  Service: Endoscopy;  Laterality: N/A;  . BOTOX INJECTION N/A 10/16/2014   Procedure: BOTOX INJECTION;  Surgeon: Inda Castle, MD;  Location: [facility];  Service: Endoscopy;  Laterality: N/A;  . BOTOX INJECTION N/A 05/18/2016   Procedure: BOTOX INJECTION;  Surgeon: Milus Banister, MD;  Location: [facility];  Service: Endoscopy;  Laterality: N/A;  . BOTOX INJECTION N/A 02/14/2018   Procedure: BOTOX INJECTION;  Surgeon: Milus Banister, MD;  Location: [facility];  Service: Endoscopy;  Laterality: N/A;  . CATARACT EXTRACTION W/  INTRAOCULAR LENS  IMPLANT, BILATERAL    . CHOLECYSTECTOMY    . ESOPHAGEAL MANOMETRY N/A 08/25/2013   Procedure: ESOPHAGEAL MANOMETRY (EM);  Surgeon: Jerene Bears, MD;  Location: [facility];  Service: Gastroenterology;  Laterality: N/A;  . ESOPHAGOGASTRODUODENOSCOPY N/A 07/26/2013   Procedure: ESOPHAGOGASTRODUODENOSCOPY (EGD);  Surgeon: Irene Shipper, MD;  Location: [facility] ENDOSCOPY;  Service: Endoscopy;  Laterality: N/A;  . ESOPHAGOGASTRODUODENOSCOPY N/A 10/16/2014   Procedure: ESOPHAGOGASTRODUODENOSCOPY (EGD);  Surgeon: Inda Castle, MD;  Location: [facility];  Service: Endoscopy;  Laterality: N/A;  with botox  . ESOPHAGOGASTRODUODENOSCOPY (EGD) WITH ESOPHAGEAL DILATION N/A 11/25/2012   Procedure: ESOPHAGOGASTRODUODENOSCOPY (EGD) WITH ESOPHAGEAL DILATION;  Surgeon: Jerene Bears, MD;  Location: [facility];  Service: Gastroenterology;  Laterality: N/A;  . ESOPHAGOGASTRODUODENOSCOPY (EGD) WITH PROPOFOL N/A 09/18/2013   Procedure: ESOPHAGOGASTRODUODENOSCOPY (EGD) WITH PROPOFOL;  Surgeon: Lafayette Dragon, MD;  Location: [facility];  Service: Endoscopy;  Laterality: N/A;  . ESOPHAGOGASTRODUODENOSCOPY (EGD) WITH PROPOFOL N/A 05/18/2016   Procedure: ESOPHAGOGASTRODUODENOSCOPY (EGD) WITH PROPOFOL;  Surgeon: Milus Banister, MD;  Location: [facility];  Service: Endoscopy;  Laterality: N/A;  . ESOPHAGOGASTRODUODENOSCOPY (EGD) WITH PROPOFOL N/A 02/14/2018  Procedure: ESOPHAGOGASTRODUODENOSCOPY (EGD) WITH PROPOFOL;  Surgeon: Milus Banister, MD;  Location: [facility];  Service: Endoscopy;  Laterality: N/A;  . HIP ARTHROPLASTY Right 09/09/2012   Procedure: ARTHROPLASTY BIPOLAR HIP;  Surgeon: Mauri Pole, MD;  Location: [facility];  Service: Orthopedics;  Laterality: Right;  . TAH and BSO  08/21/1995    Allergies  Allergen Reactions  . Sulfamethoxazole Rash    Outpatient Encounter Medications as of 10/22/2018  Medication Sig  . acetaminophen (TYLENOL) 325 MG tablet Take 650 mg by mouth every 8 (eight)  hours as needed for moderate pain.   Marland Kitchen albuterol (PROVENTIL HFA;VENTOLIN HFA) 108 (90 BASE) MCG/ACT inhaler Inhale 2 puffs into the lungs every 6 (six) hours as needed for wheezing or shortness of breath.  Marland Kitchen atorvastatin (LIPITOR) 10 MG tablet Take 10 mg by mouth at bedtime.   . chlorhexidine (PERIDEX) 0.12 % solution Use as directed 5 mLs in the mouth or throat daily. SWISH AND SPIT 5ML FOR 30 SECONDS EVERYDAY INDEFINITELY  . cholecalciferol (VITAMIN D) 1000 units tablet Take 1,000 Units by mouth daily.  . fluticasone furoate-vilanterol (BREO ELLIPTA) 100-25 MCG/INH AEPB Inhale 1 puff into the lungs daily. One puff inhale once a day, rinse mouth after each use.   . Humidifier MISC Please use humidifier to prevent excessive dryness.  Marland Kitchen HYDROcodone-acetaminophen (NORCO) 10-325 MG tablet Take 1 tablet by mouth SCHEDULED twice daily; may take an additional tablet once daily as needed for increased back pain.HOLD for sedation.  . hydroxyurea (HYDREA) 500 MG capsule Take 500 mg by mouth. Take 2 capsules=1000 mg on Monday. (Wear gloves when handling medication) May take with food to minimize GI side effects.  . hydroxyurea (HYDREA) 500 MG capsule Take 500 mg by mouth. TAKE ONE CAP BY MOUTH EVERY SUNDAY, [facility] AND Friday.May take with food to minimize GI side effects.  . memantine (NAMENDA) 10 MG tablet Take 10 mg by mouth 2 (two) times daily.   . metoprolol tartrate (LOPRESSOR) 25 MG tablet Take 25 mg by mouth 2 (two) times daily.  . montelukast (SINGULAIR) 10 MG tablet Take 10 mg by mouth at bedtime.  . Multiple Vitamins-Minerals (PRESERVISION AREDS 2) CAPS Take 1 capsule by mouth 2 (two) times daily.  Marland Kitchen NIFEdipine (PROCARDIA) 10 MG capsule Take 10 mg by mouth 3 (three) times daily before meals. Squeeze contents of one capsule under tongue 15-30 minutes before meals 3 times a day  . nitroGLYCERIN (NITROSTAT) 0.4 MG SL tablet Place 0.4 mg under the tongue every 5 (five) minutes as needed for chest pain.   Marland Kitchen omeprazole (PRILOSEC) 20 MG capsule Take 20 mg by mouth at bedtime.   . Rivaroxaban (XARELTO) 15 MG TABS tablet Take 15 mg by mouth daily. 5pm  . sertraline (ZOLOFT) 25 MG tablet Take 25 mg by mouth daily.  . sodium chloride (OCEAN) 0.65 % nasal spray Place 1 spray into the nose 2 (two) times daily.  . sodium fluoride (DENTA 5000 PLUS) 1.1 % CREA dental cream Place 1 application onto teeth at bedtime. Apply a thin ribbon   No facility-administered encounter medications on file as of 10/22/2018.     Review of Systems Review of Systems  Constitutional: Negative for activity change, appetite change, chills, diaphoresis, fatigue and fever.  HENT: Negative for mouth sores, postnasal drip, rhinorrhea, sinus pain and sore throat.   Respiratory: Negative for apnea, cough, chest tightness, shortness of breath and wheezing.   Cardiovascular: Negative for chest pain, palpitations and leg swelling.  Gastrointestinal:  Negative for abdominal distention, abdominal pain, constipation, diarrhea, nausea and vomiting.  Genitourinary: Negative for dysuria and frequency.  Musculoskeletal: Negative for arthralgias, joint swelling and myalgias.  Skin: Negative for rash.  Neurological: Negative for dizziness, syncope, weakness, light-headedness and numbness.  Psychiatric/Behavioral: Negative for behavioral problems, confusion and sleep disturbance.    Immunization History  Administered Date(s) Administered  . Influenza Split 11/16/2010, 12/13/2011  . Influenza Whole 11/27/2009, 11/29/2017  . Influenza-Unspecified 12/31/2013, 11/18/2014, 12/14/2015, 12/11/2016  . PPD Test 09/11/2012  . Pneumococcal Conjugate-13 06/01/2017  . Pneumococcal Polysaccharide-23 02/28/1988  . Tdap 05/31/2017   Pertinent  Health Maintenance Due  Topic Date Due  . INFLUENZA VACCINE  09/28/2018  . DEXA SCAN  Completed  . PNA vac Low Risk Adult  Completed   Fall Risk  02/08/2017 02/08/2017 10/04/2015 12/28/2014 10/23/2014  Falls  in the past year? No Exclusion - non ambulatory No Yes Yes  Number falls in past yr: - - - 1 1  Injury with Fall? - - - No No  Risk for fall due to : - Impaired balance/gait;Impaired vision Impaired mobility;Impaired balance/gait History of fall(s);Impaired balance/gait;Impaired mobility Impaired mobility  Follow up - - - Falls prevention discussed Falls evaluation completed   Functional Status Survey:    Vitals:   10/22/18 1448  BP: 121/67  Pulse: 64  Resp: 14  Temp: (!) 97.1 F (36.2 C)  SpO2: 96%  Weight: 103 lb 3.2 oz (46.8 kg)  Height: 5\' 1"  (1.549 m)   Body mass index is 19.5 kg/m. Physical Exam  Constitutional: Oriented to person, place, and time. Well-developed and well-nourished.  HENT:  Head: Normocephalic.  Mouth/Throat: Oropharynx is clear and moist.  Eyes: Pupils are equal, round, and reactive to light.  Neck: Neck supple.  Cardiovascular: Normal rate and normal heart sounds.  No murmur heard. Pulmonary/Chest: Effort normal and breath sounds normal. No respiratory distress. No wheezes. She has no rales.  Abdominal: Soft. Bowel sounds are normal. No distension. There is no tenderness. There is no rebound.  Musculoskeletal: No edema.  Lymphadenopathy: none Neurological: Alert and oriented to person, place, and time.  Skin: Skin is warm and dry.  Psychiatric: Normal mood and affect. Behavior is normal. Thought content normal.    Labs reviewed: Recent Labs    11/21/17 04/30/18 07/30/18  NA 145 140 143  K 4.0 4.5 3.5  CL 111 104  --   CO2 29 29  --   BUN 17 19 21   CREATININE 0.7 0.7 0.6  CALCIUM 8.5 8.9  --    Recent Labs    11/21/17 04/30/18 07/30/18  AST 12* 16 14  ALT 10 9 7   ALKPHOS 66 70  --   PROT 5.1 5.8  --   ALBUMIN 3.2 3.4  --    Recent Labs    11/21/17 01/01/18 04/30/18 07/30/18  WBC 5.7 5.6 6.3  --   HGB 11.2* 11.6* 11.7* 12.4  HCT 33* 34* 35* 36  PLT 333 292 311 252   Lab Results  Component Value Date   TSH 2.00 09/21/2016    Lab Results  Component Value Date   HGBA1C 5.5 10/16/2014   Lab Results  Component Value Date   CHOL 143 09/18/2017   HDL 50 09/18/2017   LDLCALC 70 09/18/2017   LDLDIRECT 152.3 06/04/2009   TRIG 147 09/18/2017   CHOLHDL 3 09/13/2011    Significant Diagnostic Results in last 30 days:  No results found.  Assessment/Plan  Essential hypertension On Lopressor  Essential Thrombocythemia On Hydrea Follows with Hematology with Q3 month CBC Chronic deep vein thrombosis  On Chronic Xarelto  COPD with asthma  Stable on Singulair and Breo  Anemia With Macrocytosis Folate And B12 are normal GERD On Prilosec  Esophageal dysmotility On Nifedipine Follows with GI  Late onset Alzheimer's disease  Stable on Namenda and Zoloft Continue Supportive care Hyperlipidemia LDL goal <70 Continue on Statin      Family/ staff Communication:   Labs/tests ordered:   Total time spent in this patient care encounter was  25_  minutes; greater than 50% of the visit spent counseling patient and staff, reviewing records , Labs and coordinating care for problems addressed at this encounter.

## 2018-10-29 DIAGNOSIS — E7800 Pure hypercholesterolemia, unspecified: Secondary | ICD-10-CM | POA: Diagnosis not present

## 2018-10-29 DIAGNOSIS — D638 Anemia in other chronic diseases classified elsewhere: Secondary | ICD-10-CM | POA: Diagnosis not present

## 2018-10-29 LAB — BASIC METABOLIC PANEL
BUN: 27 — AB (ref 4–21)
Creatinine: 0.8 (ref 0.5–1.1)
Glucose: 88
Potassium: 4.3 (ref 3.4–5.3)
Sodium: 141 (ref 137–147)

## 2018-10-29 LAB — HEPATIC FUNCTION PANEL
ALT: 8 (ref 7–35)
AST: 14 (ref 13–35)
Alkaline Phosphatase: 55 (ref 25–125)
Bilirubin, Total: 0.4

## 2018-10-29 LAB — CBC AND DIFFERENTIAL
HCT: 34 — AB (ref 36–46)
Hemoglobin: 11.3 — AB (ref 12.0–16.0)
Platelets: 235 (ref 150–399)
WBC: 5.6

## 2018-11-07 ENCOUNTER — Other Ambulatory Visit: Payer: Self-pay | Admitting: *Deleted

## 2018-11-07 DIAGNOSIS — M5440 Lumbago with sciatica, unspecified side: Secondary | ICD-10-CM

## 2018-11-07 MED ORDER — HYDROCODONE-ACETAMINOPHEN 10-325 MG PO TABS: ORAL_TABLET | ORAL | 0 refills | Status: DC

## 2018-11-07 NOTE — Telephone Encounter (Signed)
Received fax from [facility] Pended [facility] and sent to [employer] for Approval.  

## 2018-11-26 ENCOUNTER — Non-Acute Institutional Stay (SKILLED_NURSING_FACILITY): Payer: Medicare Other | Admitting: Internal Medicine

## 2018-11-26 ENCOUNTER — Encounter: Payer: Self-pay | Admitting: Internal Medicine

## 2018-11-26 DIAGNOSIS — I825Y9 Chronic embolism and thrombosis of unspecified deep veins of unspecified proximal lower extremity: Secondary | ICD-10-CM

## 2018-11-26 DIAGNOSIS — N939 Abnormal uterine and vaginal bleeding, unspecified: Secondary | ICD-10-CM

## 2018-11-26 DIAGNOSIS — D473 Essential (hemorrhagic) thrombocythemia: Secondary | ICD-10-CM | POA: Diagnosis not present

## 2018-11-26 DIAGNOSIS — Z03818 Encounter for observation for suspected exposure to other biological agents ruled out: Secondary | ICD-10-CM | POA: Diagnosis not present

## 2018-11-26 LAB — CHLORIDE
Albumin: 3.2
Calcium: 8.6
Carbon Dioxide, Total: 29
Chloride: 107
Globulin: 2.2
Total Protein: 5.4 — AB (ref 6.4–8.2)

## 2018-11-26 NOTE — Progress Notes (Signed)
Location:    [facility]ing Home Room Number: 60 Place of Service:  SNF (31) Provider:  Veleta Miners MD Virgie Dad, MD  Patient Care Team: Virgie Dad, MD as PCP - General (Internal Medicine) Annia Belt, MD as Consulting Physician (Hematology and Oncology) Suella Broad, MD as Consulting Physician (Physical Medicine and Rehabilitation) Noralee Space, MD (Pulmonary Disease) [name], [name], NP as Nurse Practitioner (Nurse Practitioner) Inda Castle, MD (Inactive) as Consulting Physician (Gastroenterology)  Extended Emergency Contact Information Primary Emergency Contact: Reier,Gene Address: [address]          [place], [place] [address] [place] Phone: [phone] Work Phone: [phone] Mobile Phone: [phone] Relation: Son Secondary Emergency Contact: [name], [place] [address] [place] Phone: [phone] Relation: Son  Code Status:  DNR Goals of care: Advanced Directive information Advanced Directives 11/26/2018  Does Patient Have a Medical Advance Directive? Yes  Type of Paramedic of [place];Living will;Out of facility DNR (pink MOST or yellow form)  Does patient want to make changes to medical advance directive? No - Patient declined  Copy of [place] in Chart? Yes - validated most recent copy scanned in chart (See row information)  Pre-existing out of facility DNR order (yellow form or pink MOST form) Yellow form placed in chart (order not valid for inpatient use);Pink MOST form placed in chart (order not valid for inpatient use)     Chief Complaint  Patient presents with  . Acute Visit    Vaginal bleeding    HPI:  Pt is a 85 y.o. female seen today for an acute visit for Vaginal Bleeding She is Long term resident of facility She has h/o Hypertension,Essential Thrombocythemia, H/o Esophageal Stricture,  Achalasia,DVT, Hyperlipidemia, Arthritis, Cognitive Impairement, COPD Patient unable to give detailed history due to her dementia Nurses noticed some blood in her diaper and magnified her there was blood on from her vagina. Patient denies any complaints no abdominal pain no cramping.  No hematuria or dysuria   Past Medical History:  Diagnosis Date  . Abnormal weight gain 05/09/2012  . Achalasia of esophagus 09/18/2013   09/18/13 Nifedipine 10mg  tid ac meals. 11/17/14 [facility] f/u as neeed   . Anxiety and depression 03/12/2012   Increased Sertraline to 50mg  daily 07/16/13 08/26/13 Mirtazapine 7.5mg  qhs  10/12/14 Pharm decreased Sertraline to 25mg .  05/04/15 TSH 2.89 09/15/15 pharm taper off Sertraline.  10/08/15 continue Sertraline 25mg  daily, POA desires. 12/02/15 wbc 6.1, Hgb 12.0, plt 271    . Asthmatic bronchitis   . BACK PAIN, LUMBAR 03/12/2007   Hx of lower back pain, had X-ray of her lumbar spine 09/08/12--spondylosis-better controlled with Hydrocodone/ApAp  10/325mg  ac and hs  01/01/13 lumbar spine inj. Able to ambulate with walker on unit until her fall 06/22/13. 06/25/13 unremarkable. X-ray lumbar spine. 11/10/13 dc Oxycodone prn-not used in 60 days.     . COLONIC POLYPS 03/12/2007   Initially noted April 2002 colonoscopy. Followup colonoscopy 05/31/2006 did not disclose any further polyps.    Marland Kitchen COPD (chronic obstructive pulmonary disease) ([place])   . Coronary artery disease   . Deep vein thrombosis of left lower extremity ([place]) 07/19/2012  . Dementia ([place]) 09/16/2012   05/04/15 TSH 2.89   . Depression   . Diverticulosis 05/10/2012  . DJD (degenerative joint disease)   . Dry skin dermatitis 11/18/2012  .  Edema of leg 07/01/2010  . Epistaxis 01/24/2016   01/21/16: x2, held Xarelto x 1 day, Saline Nasal spray prn, wbc 9.5, Hgb 12.4, Plt 331, Na 138, K 4.5, Bun 25, creat 0.89 01/22/16 [id number])  . Esophageal dysmotility 07/26/2013  . Esophageal dysphagia 05/27/2010   New dx of achalasia 6/ 2015 09/18/13  Nifedipine 10mg  tid ac meals.    . Esophageal stricture   . Essential hypertension, benign 10/30/2013   12/02/15 wbc 6.1, Hgb 12.0, plt 271   . Essential thrombocythemia ([place]) 06/09/2009  . Fall 06/25/2013  . GERD (gastroesophageal reflux disease)   . Hip fracture ([place]) 09/08/2012  . HYPERCHOLESTEROLEMIA 03/12/2007   08/10/15 cholesterol 119, triglycerides 143, HDL 51, LDL 39   . Lumbar back pain   . MACULAR DEGENERATION 01/26/2008   Qualifier: Diagnosis of  By: Lenna Gilford MD, Deborra Medina   . Osteoarthritis 03/12/2007  . Osteoporosis 03/12/2007   Qualifier: Diagnosis of  By: Julien Girt CMA, Marliss Czar  06/15/14 dc Vit D 1000u po daily per pharm.    . Phlebitis and thrombophlebitis of other deep vessels of lower extremities 07/10/2010  . S/P balloon dilatation of esophageal stricture 07/28/2013   07/26/13. Dr. Henrene Pastor. EGD foreign removal and Balloon dilation of esophagus. Esophageal dysmotility, suspected achalasia, dilation at the GE junction to 57mm. Candida esophagitis.    . SYNCOPE 07/30/2009   Occurred 2011. Hospitalized. Felt due to dehydration.    . TRANSIENT ISCHEMIC ATTACKS, HX OF 03/12/2007   Qualifier: Diagnosis of  By: Julien Girt CMA, Marliss Czar     Past Surgical History:  Procedure Laterality Date  . ABDOMINAL HYSTERECTOMY     and bso  . APPENDECTOMY    . BOTOX INJECTION N/A 09/18/2013   Procedure: BOTOX INJECTION;  Surgeon: Lafayette Dragon, MD;  Location: [facility];  Service: Endoscopy;  Laterality: N/A;  . BOTOX INJECTION N/A 10/16/2014   Procedure: BOTOX INJECTION;  Surgeon: Inda Castle, MD;  Location: [facility];  Service: Endoscopy;  Laterality: N/A;  . BOTOX INJECTION N/A 05/18/2016   Procedure: BOTOX INJECTION;  Surgeon: Milus Banister, MD;  Location: [facility];  Service: Endoscopy;  Laterality: N/A;  . BOTOX INJECTION N/A 02/14/2018   Procedure: BOTOX INJECTION;  Surgeon: Milus Banister, MD;  Location: [facility];  Service: Endoscopy;  Laterality: N/A;  . CATARACT EXTRACTION W/ INTRAOCULAR  LENS  IMPLANT, BILATERAL    . CHOLECYSTECTOMY    . ESOPHAGEAL MANOMETRY N/A 08/25/2013   Procedure: ESOPHAGEAL MANOMETRY (EM);  Surgeon: Jerene Bears, MD;  Location: [facility];  Service: Gastroenterology;  Laterality: N/A;  . ESOPHAGOGASTRODUODENOSCOPY N/A 07/26/2013   Procedure: ESOPHAGOGASTRODUODENOSCOPY (EGD);  Surgeon: Irene Shipper, MD;  [facility];  Service: Endoscopy;  Laterality: N/A;  . ESOPHAGOGASTRODUODENOSCOPY N/A 10/16/2014   Procedure: ESOPHAGOGASTRODUODENOSCOPY (EGD);  Surgeon: Inda Castle, MD;  Location: [facility];  Service: Endoscopy;  Laterality: N/A;  with botox  . ESOPHAGOGASTRODUODENOSCOPY (EGD) WITH ESOPHAGEAL DILATION N/A 11/25/2012   Procedure: ESOPHAGOGASTRODUODENOSCOPY (EGD) WITH ESOPHAGEAL DILATION;  Surgeon: Jerene Bears, MD;  Location: [facility];  Service: Gastroenterology;  Laterality: N/A;  . ESOPHAGOGASTRODUODENOSCOPY (EGD) WITH PROPOFOL N/A 09/18/2013   Procedure: ESOPHAGOGASTRODUODENOSCOPY (EGD) WITH PROPOFOL;  Surgeon: Lafayette Dragon, MD;  Location: [facility];  Service: Endoscopy;  Laterality: N/A;  . ESOPHAGOGASTRODUODENOSCOPY (EGD) WITH PROPOFOL N/A 05/18/2016   Procedure: ESOPHAGOGASTRODUODENOSCOPY (EGD) WITH PROPOFOL;  Surgeon: Milus Banister, MD;  Location: [facility];  Service: Endoscopy;  Laterality: N/A;  . ESOPHAGOGASTRODUODENOSCOPY (EGD) WITH PROPOFOL N/A 02/14/2018  Procedure: ESOPHAGOGASTRODUODENOSCOPY (EGD) WITH PROPOFOL;  Surgeon: Milus Banister, MD;  Location: [facility];  Service: Endoscopy;  Laterality: N/A;  . HIP ARTHROPLASTY Right 09/09/2012   Procedure: ARTHROPLASTY BIPOLAR HIP;  Surgeon: Mauri Pole, MD;  Location: [facility];  Service: Orthopedics;  Laterality: Right;  . TAH and BSO  08/21/1995    Allergies  Allergen Reactions  . Sulfamethoxazole Rash    Allergies as of 11/26/2018      Reactions   Sulfamethoxazole Rash      Medication List       Accurate as of November 26, 2018  3:34 PM. If you have  any questions, ask your nurse or doctor.        acetaminophen 325 MG tablet Commonly known as: TYLENOL Take 650 mg by mouth every 8 (eight) hours as needed for moderate pain.   albuterol 108 (90 Base) MCG/ACT inhaler Commonly known as: VENTOLIN HFA Inhale 2 puffs into the lungs every 6 (six) hours as needed for wheezing or shortness of breath.   atorvastatin 10 MG tablet Commonly known as: LIPITOR Take 10 mg by mouth at bedtime.   Breo Ellipta 100-25 MCG/INH Aepb Generic drug: fluticasone furoate-vilanterol Inhale 1 puff into the lungs daily. One puff inhale once a day, rinse mouth after each use.   chlorhexidine 0.12 % solution Commonly known as: PERIDEX Use as directed 5 mLs in the mouth or throat daily. SWISH AND SPIT 5ML FOR 30 SECONDS EVERYDAY INDEFINITELY   cholecalciferol 1000 units tablet Commonly known as: VITAMIN D Take 1,000 Units by mouth daily.   Denta 5000 Plus 1.1 % Crea dental cream Generic drug: sodium fluoride Place 1 application onto teeth at bedtime. Apply a thin ribbon   Humidifier Misc Please use humidifier to prevent excessive dryness.   HYDROcodone-acetaminophen 10-325 MG tablet Commonly known as: NORCO Take 1 tablet by mouth SCHEDULED twice daily; may take an additional tablet once daily as needed for increased back pain.HOLD for sedation.   hydroxyurea 500 MG capsule Commonly known as: HYDREA Take 500 mg by mouth. Take 2 capsules=1000 mg on Monday. (Wear gloves when handling medication) May take with food to minimize GI side effects.   hydroxyurea 500 MG capsule Commonly known as: HYDREA Take 500 mg by mouth. TAKE ONE CAP BY MOUTH EVERY SUNDAY, [facility] AND Friday.May take with food to minimize GI side effects.   memantine 10 MG tablet Commonly known as: NAMENDA Take 10 mg by mouth 2 (two) times daily.   metoprolol tartrate 25 MG tablet Commonly known as: LOPRESSOR Take 25 mg by mouth 2 (two) times daily.   montelukast 10 MG tablet  Commonly known as: SINGULAIR Take 10 mg by mouth at bedtime.   NIFEdipine 10 MG capsule Commonly known as: PROCARDIA Take 10 mg by mouth 3 (three) times daily before meals. Squeeze contents of one capsule under tongue 15-30 minutes before meals 3 times a day   nitroGLYCERIN 0.4 MG SL tablet Commonly known as: NITROSTAT Place 0.4 mg under the tongue every 5 (five) minutes as needed for chest pain.   omeprazole 20 MG capsule Commonly known as: PRILOSEC Take 20 mg by mouth at bedtime.   PreserVision AREDS 2 Caps Take 1 capsule by mouth 2 (two) times daily.   Rivaroxaban 15 MG Tabs tablet Commonly known as: XARELTO Take 15 mg by mouth daily. 5pm   sertraline 25 MG tablet Commonly known as: ZOLOFT Take 25 mg by mouth daily.   sodium chloride 0.65 % nasal spray  Commonly known as: OCEAN Place 1 spray into the nose 2 (two) times daily.       Review of Systems  Review of Systems  Constitutional: Negative for activity change, appetite change, chills, diaphoresis, fatigue and fever.  HENT: Negative for mouth sores, postnasal drip, rhinorrhea, sinus pain and sore throat.   Respiratory: Negative for apnea, cough, chest tightness, shortness of breath and wheezing.   Cardiovascular: Negative for chest pain, palpitations and leg swelling.  Gastrointestinal: Negative for abdominal distention, abdominal pain, constipation, diarrhea, nausea and vomiting.  Genitourinary: Negative for dysuria and frequency.  Musculoskeletal: Negative for arthralgias, joint swelling and myalgias.  Skin: Negative for rash.  Neurological: Negative for dizziness, syncope, weakness, light-headedness and numbness.  Psychiatric/Behavioral: Negative for behavioral problems, confusion and sleep disturbance.     Immunization History  Administered Date(s) Administered  . Influenza Split 11/16/2010, 12/13/2011  . Influenza Whole 11/27/2009, 11/29/2017  . Influenza-Unspecified 12/31/2013, 11/18/2014, 12/14/2015,  12/11/2016  . PPD Test 09/11/2012  . Pneumococcal Conjugate-13 06/01/2017  . Pneumococcal Polysaccharide-23 02/28/1988  . Tdap 05/31/2017   Pertinent  Health Maintenance Due  Topic Date Due  . INFLUENZA VACCINE  09/28/2018  . DEXA SCAN  Completed  . PNA vac Low Risk Adult  Completed   Fall Risk  02/08/2017 02/08/2017 10/04/2015 12/28/2014 10/23/2014  Falls in the past year? No Exclusion - non ambulatory No Yes Yes  Number falls in past yr: - - - 1 1  Injury with Fall? - - - No No  Risk for fall due to : - Impaired balance/gait;Impaired vision Impaired mobility;Impaired balance/gait History of fall(s);Impaired balance/gait;Impaired mobility Impaired mobility  Follow up - - - Falls prevention discussed Falls evaluation completed   Functional Status Survey:    Vitals:   11/26/18 1519  BP: 136/68  Pulse: 70  Resp: 18  Temp: (!) 97.2 F (36.2 C)  SpO2: 95%  Weight: 103 lb 3.2 oz (46.8 kg)   Body mass index is 19.5 kg/m. Physical Exam  Constitutional: . Well-developed and well-nourished.  HENT:  Head: Normocephalic.  Mouth/Throat: Oropharynx is clear and moist.  Eyes: Pupils are equal, round, and reactive to light.  Neck: Neck supple.  Cardiovascular: Normal rate and normal heart sounds.  No murmur heard. Pulmonary/Chest: Effort normal and breath sounds normal. No respiratory distress. No wheezes. She has no rales.  Abdominal: Soft. Bowel sounds are normal. No distension. There is no tenderness. There is no rebound.  Musculoskeletal: No edema.  Lymphadenopathy: none Neurological: No Focal deficits Skin: Skin is warm and dry.  Psychiatric: Normal mood and affect. Behavior is normal. Thought content normal.  Vaginal Exam done with the Nurse Not able to do full exam but had blood on Gloves on mu fingers on vaginal exam No outside laceration  Labs reviewed: Recent Labs    04/30/18 07/30/18 10/29/18  NA 140 143 141  K 4.5 3.5 4.3  CL 104  --  107  CO2 29  --  29  BUN 19  21 27*  CREATININE 0.7 0.6 0.8  CALCIUM 8.9  --  8.6   Recent Labs    04/30/18 07/30/18 10/29/18  AST 16 14 14   ALT 9 7 8   ALKPHOS 70  --  55  PROT 5.8  --  5.4*  ALBUMIN 3.4  --  3.2   Recent Labs    01/01/18 04/30/18 07/30/18 10/29/18  WBC 5.6 6.3  --  5.6  HGB 11.6* 11.7* 12.4 11.3*  HCT 34* 35* 36 34*  PLT 292 311 252 235   Lab Results  Component Value Date   TSH 2.00 09/21/2016   Lab Results  Component Value Date   HGBA1C 5.5 10/16/2014   Lab Results  Component Value Date   CHOL 143 09/18/2017   HDL 50 09/18/2017   LDLCALC 70 09/18/2017   LDLDIRECT 152.3 06/04/2009   TRIG 147 09/18/2017   CHOLHDL 3 09/13/2011    Significant Diagnostic Results in last 30 days:  No results found.  Assessment/Plan Possible Vaginal Bleed ? Etiology Cannot do detail work up Will discontinue Xarelto for now Repeat CBC in few day Send UA Possible will need uterine US but with patient's age I would not recommend it at this time. Will discuss with her family.  Will wait for her bleeding to stop after stopping Xarelto H/o Chronic DVT On Xarelto Will hold it for now Essential Thrombocythemia On Hydrea Follows with Hematology with Q3 month CBC Family/ staff Communication:  Labs/tests ordered:  CBC Total time spent in this patient care encounter was  25_  minutes; greater than 50% of the visit spent counseling patient and staff, reviewing records , Labs and coordinating care for problems addressed at this encounter.

## 2018-11-27 DIAGNOSIS — I825Y9 Chronic embolism and thrombosis of unspecified deep veins of unspecified proximal lower extremity: Secondary | ICD-10-CM | POA: Insufficient documentation

## 2018-11-27 DIAGNOSIS — R4182 Altered mental status, unspecified: Secondary | ICD-10-CM | POA: Diagnosis not present

## 2018-11-27 DIAGNOSIS — N939 Abnormal uterine and vaginal bleeding, unspecified: Secondary | ICD-10-CM | POA: Insufficient documentation

## 2018-11-28 DIAGNOSIS — D638 Anemia in other chronic diseases classified elsewhere: Secondary | ICD-10-CM | POA: Diagnosis not present

## 2018-11-28 DIAGNOSIS — I10 Essential (primary) hypertension: Secondary | ICD-10-CM | POA: Diagnosis not present

## 2018-11-28 LAB — CBC AND DIFFERENTIAL
HCT: 35 — AB (ref 36–46)
Hemoglobin: 11.8 — AB (ref 12.0–16.0)
Neutrophils Absolute: 2820
Platelets: 290 (ref 150–399)
WBC: 5.1

## 2018-11-28 LAB — BASIC METABOLIC PANEL
BUN: 19 (ref 4–21)
Creatinine: 0.6 (ref 0.5–1.1)
Glucose: 58
Potassium: 5.7 — AB (ref 3.4–5.3)
Sodium: 140 (ref 137–147)

## 2018-11-29 DIAGNOSIS — I10 Essential (primary) hypertension: Secondary | ICD-10-CM | POA: Diagnosis not present

## 2018-11-29 DIAGNOSIS — D638 Anemia in other chronic diseases classified elsewhere: Secondary | ICD-10-CM | POA: Diagnosis not present

## 2018-11-29 LAB — BASIC METABOLIC PANEL
BUN: 22 — AB (ref 4–21)
Creatinine: 0.9 (ref 0.5–1.1)
Glucose: 160
Potassium: 4.4 (ref 3.4–5.3)
Sodium: 141 (ref 137–147)

## 2018-11-29 LAB — CBC AND DIFFERENTIAL
HCT: 37 (ref 36–46)
Hemoglobin: 12.4 (ref 12.0–16.0)
Neutrophils Absolute: 4743
Platelets: 293 (ref 150–399)
WBC: 7.1

## 2018-12-02 ENCOUNTER — Non-Acute Institutional Stay (SKILLED_NURSING_FACILITY): Payer: Medicare Other | Admitting: Nurse Practitioner

## 2018-12-02 ENCOUNTER — Encounter: Payer: Self-pay | Admitting: Nurse Practitioner

## 2018-12-02 DIAGNOSIS — N939 Abnormal uterine and vaginal bleeding, unspecified: Secondary | ICD-10-CM | POA: Diagnosis not present

## 2018-12-02 DIAGNOSIS — N390 Urinary tract infection, site not specified: Secondary | ICD-10-CM

## 2018-12-02 DIAGNOSIS — D473 Essential (hemorrhagic) thrombocythemia: Secondary | ICD-10-CM | POA: Diagnosis not present

## 2018-12-02 DIAGNOSIS — I825Y9 Chronic embolism and thrombosis of unspecified deep veins of unspecified proximal lower extremity: Secondary | ICD-10-CM | POA: Diagnosis not present

## 2018-12-02 DIAGNOSIS — R319 Hematuria, unspecified: Secondary | ICD-10-CM

## 2018-12-02 LAB — CHLORIDE
Calcium: 8.7
Calcium: 9.3
Carbon Dioxide, Total: 25
Carbon Dioxide, Total: 26
Chloride: 106
Chloride: 107

## 2018-12-02 NOTE — Progress Notes (Signed)
Location:   [facility] Room Number: 18 Place of Service:  SNF (31) Provider:  Kalinda Romaniello NP  Virgie Dad, MD  Patient Care Team: Virgie Dad, MD as PCP - General (Internal Medicine) Annia Belt, MD as Consulting Physician (Hematology and Oncology) Suella Broad, MD as Consulting Physician (Physical Medicine and Rehabilitation) Noralee Space, MD (Pulmonary Disease) [place] Kierstan Auer X, NP as Nurse Practitioner (Nurse Practitioner) Inda Castle, MD (Inactive) as Consulting Physician (Gastroenterology)  Extended Emergency Contact Information Primary Emergency Contact: Enrique,Gene Address: [address]          [place], [place] [address] [place] Phone: [phone] Work Phone: [phone] Mobile Phone: [phone] Relation: Son Secondary Emergency Contact: [name], [place] [address] [address] Phone: [phone] Relation: Son  Code Status:  DNR Goals of care: Advanced Directive information Advanced Directives 12/02/2018  Does Patient Have a Medical Advance Directive? Yes  Type of Paramedic of [place];Living will;Out of facility DNR (pink MOST or yellow form)  Does patient want to make changes to medical advance directive? No - Patient declined  Copy of [place] in Chart? Yes - validated most recent copy scanned in chart (See row information)  Pre-existing out of facility DNR order (yellow form or pink MOST form) Yellow form placed in chart (order not valid for inpatient use);Pink MOST form placed in chart (order not valid for inpatient use)     Chief Complaint  Patient presents with  . Acute Visit    UTI    HPI:  Pt is a 85 y.o. female seen today for an acute visit for UTI. The patient was seen 11/26/18 for ? Vaginal bleed, held  Xarelto, pending f/u CBC. UTI was identified upon workup for ? Vaginal bleed. 11/27/18  urine culture E coli >100,000c/ml, 11/30/18 Augmentin 500mg  bid x 7 days. Hx of essential thrombocythemia, on Hydrea. Hx of DVT, on Xarelto, on hold.    Past Medical History:  Diagnosis Date  . Abnormal weight gain 05/09/2012  . Achalasia of esophagus 09/18/2013   09/18/13 Nifedipine 10mg  tid ac meals. 11/17/14 [facility] f/u as neeed   . Anxiety and depression 03/12/2012   Increased Sertraline to 50mg  daily 07/16/13 08/26/13 Mirtazapine 7.5mg  qhs  10/12/14 Pharm decreased Sertraline to 25mg .  05/04/15 TSH 2.89 09/15/15 pharm taper off Sertraline.  10/08/15 continue Sertraline 25mg  daily, POA desires. 12/02/15 wbc 6.1, Hgb 12.0, plt 271    . Asthmatic bronchitis   . BACK PAIN, LUMBAR 03/12/2007   Hx of lower back pain, had X-ray of her lumbar spine 09/08/12--spondylosis-better controlled with Hydrocodone/ApAp  10/325mg  ac and hs  01/01/13 lumbar spine inj. Able to ambulate with walker on unit until her fall 06/22/13. 06/25/13 unremarkable. X-ray lumbar spine. 11/10/13 dc Oxycodone prn-not used in 60 days.     . COLONIC POLYPS 03/12/2007   Initially noted April 2002 colonoscopy. Followup colonoscopy 05/31/2006 did not disclose any further polyps.    Marland Kitchen COPD (chronic obstructive pulmonary disease) ([place])   . Coronary artery disease   . Deep vein thrombosis of left lower extremity ([place]) 07/19/2012  . Dementia ([place]) 09/16/2012   05/04/15 TSH 2.89   . Depression   . Diverticulosis 05/10/2012  . DJD (degenerative joint disease)   . Dry skin dermatitis 11/18/2012  . Edema of leg 07/01/2010  . Epistaxis 01/24/2016  01/21/16: x2, held Xarelto x 1 day, Saline Nasal spray prn, wbc 9.5, Hgb 12.4, Plt 331, Na 138, K 4.5, Bun 25, creat 0.89 01/22/16 [id number]  . Esophageal dysmotility 07/26/2013  . Esophageal dysphagia 05/27/2010   New dx of achalasia 6/ 2015 09/18/13 Nifedipine 10mg  tid ac meals.    . Esophageal stricture   . Essential hypertension, benign 10/30/2013   12/02/15 wbc 6.1, Hgb 12.0, plt 271   . Essential  thrombocythemia ([place]) 06/09/2009  . Fall 06/25/2013  . GERD (gastroesophageal reflux disease)   . Hip fracture ([place]) 09/08/2012  . HYPERCHOLESTEROLEMIA 03/12/2007   08/10/15 cholesterol 119, triglycerides 143, HDL 51, LDL 39   . Lumbar back pain   . MACULAR DEGENERATION 01/26/2008   Qualifier: Diagnosis of  By: Lenna Gilford MD, Deborra Medina   . Osteoarthritis 03/12/2007  . Osteoporosis 03/12/2007   Qualifier: Diagnosis of  By: Julien Girt CMA, Marliss Czar  06/15/14 dc Vit D 1000u po daily per pharm.    . Phlebitis and thrombophlebitis of other deep vessels of lower extremities 07/10/2010  . S/P balloon dilatation of esophageal stricture 07/28/2013   07/26/13. Dr. Henrene Pastor. EGD foreign removal and Balloon dilation of esophagus. Esophageal dysmotility, suspected achalasia, dilation at the GE junction to 82mm. Candida esophagitis.    . SYNCOPE 07/30/2009   Occurred 2011. Hospitalized. Felt due to dehydration.    . TRANSIENT ISCHEMIC ATTACKS, HX OF 03/12/2007   Qualifier: Diagnosis of  By: Julien Girt CMA, Marliss Czar     Past Surgical History:  Procedure Laterality Date  . ABDOMINAL HYSTERECTOMY     and bso  . APPENDECTOMY    . BOTOX INJECTION N/A 09/18/2013   Procedure: BOTOX INJECTION;  Surgeon: Lafayette Dragon, MD;  Location: [facility];  Service: Endoscopy;  Laterality: N/A;  . BOTOX INJECTION N/A 10/16/2014   Procedure: BOTOX INJECTION;  Surgeon: Inda Castle, MD;  Location: [facility];  Service: Endoscopy;  Laterality: N/A;  . BOTOX INJECTION N/A 05/18/2016   Procedure: BOTOX INJECTION;  Surgeon: Milus Banister, MD;  Location: [facility];  Service: Endoscopy;  Laterality: N/A;  . BOTOX INJECTION N/A 02/14/2018   Procedure: BOTOX INJECTION;  Surgeon: Milus Banister, MD;  Location: [facility];  Service: Endoscopy;  Laterality: N/A;  . CATARACT EXTRACTION W/ INTRAOCULAR LENS  IMPLANT, BILATERAL    . CHOLECYSTECTOMY    . ESOPHAGEAL MANOMETRY N/A 08/25/2013   Procedure: ESOPHAGEAL MANOMETRY (EM);  Surgeon: Jerene Bears, MD;   [facility]: [facility];  Service: Gastroenterology;  Laterality: N/A;  . ESOPHAGOGASTRODUODENOSCOPY N/A 07/26/2013   Procedure: ESOPHAGOGASTRODUODENOSCOPY (EGD);  Surgeon: Irene Shipper, MD;  [facility];  Service: Endoscopy;  Laterality: N/A;  . ESOPHAGOGASTRODUODENOSCOPY N/A 10/16/2014   Procedure: ESOPHAGOGASTRODUODENOSCOPY (EGD);  Surgeon: Inda Castle, MD;  Location: [facility];  Service: Endoscopy;  Laterality: N/A;  with botox  . ESOPHAGOGASTRODUODENOSCOPY (EGD) WITH ESOPHAGEAL DILATION N/A 11/25/2012   Procedure: ESOPHAGOGASTRODUODENOSCOPY (EGD) WITH ESOPHAGEAL DILATION;  Surgeon: Jerene Bears, MD;  [facility];  Service: Gastroenterology;  Laterality: N/A;  . ESOPHAGOGASTRODUODENOSCOPY (EGD) WITH PROPOFOL N/A 09/18/2013   Procedure: ESOPHAGOGASTRODUODENOSCOPY (EGD) WITH PROPOFOL;  Surgeon: Lafayette Dragon, MD;  [facility];  Service: Endoscopy;  Laterality: N/A;  . ESOPHAGOGASTRODUODENOSCOPY (EGD) WITH PROPOFOL N/A 05/18/2016   Procedure: ESOPHAGOGASTRODUODENOSCOPY (EGD) WITH PROPOFOL;  Surgeon: Milus Banister, MD;  [facility];  Service: Endoscopy;  Laterality: N/A;  . ESOPHAGOGASTRODUODENOSCOPY (EGD) WITH PROPOFOL N/A 02/14/2018   Procedure: ESOPHAGOGASTRODUODENOSCOPY (EGD) WITH PROPOFOL;  Surgeon: Milus Banister,  MD;  [facility];  Service: Endoscopy;  Laterality: N/A;  . HIP ARTHROPLASTY Right 09/09/2012   Procedure: ARTHROPLASTY BIPOLAR HIP;  Surgeon: Mauri Pole, MD;  Location: [facility];  Service: Orthopedics;  Laterality: Right;  . TAH and BSO  08/21/1995    Allergies  Allergen Reactions  . Sulfamethoxazole Rash    Allergies as of 12/02/2018      Reactions   Sulfamethoxazole Rash      Medication List       Accurate as of December 02, 2018 11:59 PM. If you have any questions, ask your nurse or doctor.        STOP taking these medications   Rivaroxaban 15 MG Tabs tablet Commonly known as: XARELTO Stopped by: Donzel Romack X  Kayliah Tindol, NP     TAKE these medications   acetaminophen 325 MG tablet Commonly known as: TYLENOL Take 650 mg by mouth every 8 (eight) hours as needed for moderate pain.   albuterol 108 (90 Base) MCG/ACT inhaler Commonly known as: VENTOLIN HFA Inhale 2 puffs into the lungs every 6 (six) hours as needed for wheezing or shortness of breath.   amoxicillin-clavulanate 500-125 MG tablet Commonly known as: AUGMENTIN Take 1 tablet by mouth 2 (two) times daily.   atorvastatin 10 MG tablet Commonly known as: LIPITOR Take 10 mg by mouth at bedtime.   Breo Ellipta 100-25 MCG/INH Aepb Generic drug: fluticasone furoate-vilanterol Inhale 1 puff into the lungs daily. One puff inhale once a day, rinse mouth after each use.   chlorhexidine 0.12 % solution Commonly known as: PERIDEX Use as directed 5 mLs in the mouth or throat daily. SWISH AND SPIT 5ML FOR 30 SECONDS EVERYDAY INDEFINITELY   cholecalciferol 1000 units tablet Commonly known as: VITAMIN D Take 1,000 Units by mouth daily.   Denta 5000 Plus 1.1 % Crea dental cream Generic drug: sodium fluoride Place 1 application onto teeth at bedtime. Apply a thin ribbon   Humidifier Misc Please use humidifier to prevent excessive dryness.   HYDROcodone-acetaminophen 10-325 MG tablet Commonly known as: NORCO Take 1 tablet by mouth SCHEDULED twice daily; may take an additional tablet once daily as needed for increased back pain.HOLD for sedation.   hydroxyurea 500 MG capsule Commonly known as: HYDREA Take 500 mg by mouth. Take 2 capsules=1000 mg on Monday. (Wear gloves when handling medication) May take with food to minimize GI side effects.   hydroxyurea 500 MG capsule Commonly known as: HYDREA Take 500 mg by mouth. TAKE ONE CAP BY MOUTH EVERY SUNDAY, [facility] AND Friday.May take with food to minimize GI side effects.   memantine 10 MG tablet Commonly known as: NAMENDA Take 10 mg by mouth 2 (two) times daily.   metoprolol tartrate 25 MG  tablet Commonly known as: LOPRESSOR Take 25 mg by mouth 2 (two) times daily.   montelukast 10 MG tablet Commonly known as: SINGULAIR Take 10 mg by mouth at bedtime.   NIFEdipine 10 MG capsule Commonly known as: PROCARDIA Take 10 mg by mouth 3 (three) times daily before meals. Squeeze contents of one capsule under tongue 15-30 minutes before meals 3 times a day   nitroGLYCERIN 0.4 MG SL tablet Commonly known as: NITROSTAT Place 0.4 mg under the tongue every 5 (five) minutes as needed for chest pain.   omeprazole 20 MG capsule Commonly known as: PRILOSEC Take 20 mg by mouth at bedtime.   PreserVision AREDS 2 Caps Take 1 capsule by mouth 2 (two) times daily.   saccharomyces boulardii  250 MG capsule Commonly known as: FLORASTOR Take 250 mg by mouth 2 (two) times daily.   sertraline 25 MG tablet Commonly known as: ZOLOFT Take 25 mg by mouth daily.   sodium chloride 0.65 % nasal spray Commonly known as: OCEAN Place 1 spray into the nose 2 (two) times daily.      ROS was provided with assistance of staff.  Review of Systems  Constitutional: Negative for activity change, appetite change, chills, diaphoresis, fatigue and fever.  HENT: Positive for hearing loss. Negative for congestion and voice change.   Respiratory: Negative for cough and shortness of breath.   Cardiovascular: Negative for chest pain, palpitations and leg swelling.  Gastrointestinal: Negative for abdominal distention, abdominal pain, constipation, diarrhea, nausea and vomiting.  Genitourinary: Negative for difficulty urinating, dysuria, frequency, hematuria, urgency and vaginal bleeding.  Musculoskeletal: Positive for arthralgias and gait problem.  Skin: Negative for color change and pallor.  Neurological: Negative for dizziness, speech difficulty, weakness and headaches.       Memory lapses.   Psychiatric/Behavioral: Negative for agitation, behavioral problems, hallucinations and sleep disturbance. The  patient is not nervous/anxious.     Immunization History  Administered Date(s) Administered  . Influenza Split 11/16/2010, 12/13/2011  . Influenza Whole 11/27/2009, 11/29/2017  . Influenza-Unspecified 12/31/2013, 11/18/2014, 12/14/2015, 12/11/2016  . PPD Test 09/11/2012  . Pneumococcal Conjugate-13 06/01/2017  . Pneumococcal Polysaccharide-23 02/28/1988  . Tdap 05/31/2017   Pertinent  Health Maintenance Due  Topic Date Due  . INFLUENZA VACCINE  09/28/2018  . DEXA SCAN  Completed  . PNA vac Low Risk Adult  Completed   Fall Risk  02/08/2017 02/08/2017 10/04/2015 12/28/2014 10/23/2014  Falls in the past year? No Exclusion - non ambulatory No Yes Yes  Number falls in past yr: - - - 1 1  Injury with Fall? - - - No No  Risk for fall due to : - Impaired balance/gait;Impaired vision Impaired mobility;Impaired balance/gait History of fall(s);Impaired balance/gait;Impaired mobility Impaired mobility  Follow up - - - Falls prevention discussed Falls evaluation completed   Functional Status Survey:    Vitals:   12/02/18 1546  BP: 110/60  Pulse: 66  Resp: 20  Temp: (!) 97.3 F (36.3 C)  SpO2: 95%  Weight: 103 lb 14.4 oz (47.1 kg)  Height: 5\' 1"  (1.549 m)   Body mass index is 19.63 kg/m. Physical Exam Vitals signs and nursing note reviewed.  Constitutional:      General: She is not in acute distress.    Appearance: Normal appearance. She is not ill-appearing, toxic-appearing or diaphoretic.  HENT:     Head: Normocephalic and atraumatic.     Nose: Nose normal.     Mouth/Throat:     Mouth: Mucous membranes are moist.  Eyes:     Extraocular Movements: Extraocular movements intact.     Conjunctiva/sclera: Conjunctivae normal.     Pupils: Pupils are equal, round, and reactive to light.  Neck:     Musculoskeletal: Normal range of motion and neck supple.  Cardiovascular:     Rate and Rhythm: Normal rate and regular rhythm.     Heart sounds: No murmur.  Pulmonary:     Effort:  Pulmonary effort is normal.     Breath sounds: No wheezing, rhonchi or rales.  Abdominal:     General: Bowel sounds are normal. There is no distension.     Palpations: Abdomen is soft.     Tenderness: There is no abdominal tenderness. There is no right CVA tenderness,  left CVA tenderness, guarding or rebound.  Musculoskeletal:     Right lower leg: No edema.     Left lower leg: No edema.  Skin:    General: Skin is warm and dry.  Neurological:     General: No focal deficit present.     Mental Status: She is alert. Mental status is at baseline.     Cranial Nerves: No cranial nerve deficit.     Motor: No weakness.     Coordination: Coordination normal.     Gait: Gait abnormal.     Comments: Oriented to person, place.   Psychiatric:        Mood and Affect: Mood normal.        Behavior: Behavior normal.        Thought Content: Thought content normal.     Labs reviewed: Recent Labs    10/29/18 11/28/18 11/29/18  NA 141 140 141  K 4.3 5.7* 4.4  CL 107 106 107  CO2 29 26 25   BUN 27* 19 22*  CREATININE 0.8 0.6 0.9  CALCIUM 8.6 8.7 9.3   Recent Labs    04/30/18 07/30/18 10/29/18  AST 16 14 14   ALT 9 7 8   ALKPHOS 70  --  55  PROT 5.8  --  5.4*  ALBUMIN 3.4  --  3.2   Recent Labs    10/29/18 11/28/18 11/29/18  WBC 5.6 5.1 7.1  NEUTROABS  --  2,820 4,743  HGB 11.3* 11.8* 12.4  HCT 34* 35* 37  PLT 235 290 293   Lab Results  Component Value Date   TSH 2.00 09/21/2016   Lab Results  Component Value Date   HGBA1C 5.5 10/16/2014   Lab Results  Component Value Date   CHOL 143 09/18/2017   HDL 50 09/18/2017   LDLCALC 70 09/18/2017   LDLDIRECT 152.3 06/04/2009   TRIG 147 09/18/2017   CHOLHDL 3 09/13/2011    Significant Diagnostic Results in last 30 days:  No results found.  Assessment/Plan UTI (urinary tract infection) 11/27/18 urine culture E coli >100,000c/ml, 11/30/18 Augmentin 500mg  bid x 7 days  Vaginal bleeding No further bleeding.   Essential  thrombocythemia ([place]) Continue Hydrea.   Chronic deep vein thrombosis (DVT) of proximal vein of lower extremity ([facility]) Currently, Xarelto on hold.      Family/ staff Communication: plan of care reviewed with the patient and charge nurse.   Labs/tests ordered:  none  Time spend 25 minutes.

## 2018-12-02 NOTE — Assessment & Plan Note (Signed)
11/27/18 urine culture E coli >100,000c/ml, 11/30/18 Augmentin 500mg  bid x 7 days

## 2018-12-02 NOTE — Assessment & Plan Note (Signed)
No further bleeding.

## 2018-12-02 NOTE — Assessment & Plan Note (Signed)
Currently, Xarelto on hold.

## 2018-12-02 NOTE — Assessment & Plan Note (Signed)
Continue Hydrea.

## 2018-12-03 DIAGNOSIS — Z03818 Encounter for observation for suspected exposure to other biological agents ruled out: Secondary | ICD-10-CM | POA: Diagnosis not present

## 2018-12-12 ENCOUNTER — Encounter: Payer: Self-pay | Admitting: Nurse Practitioner

## 2018-12-12 ENCOUNTER — Non-Acute Institutional Stay (SKILLED_NURSING_FACILITY): Payer: Medicare Other | Admitting: Nurse Practitioner

## 2018-12-12 DIAGNOSIS — F329 Major depressive disorder, single episode, unspecified: Secondary | ICD-10-CM

## 2018-12-12 DIAGNOSIS — G8929 Other chronic pain: Secondary | ICD-10-CM

## 2018-12-12 DIAGNOSIS — I82402 Acute embolism and thrombosis of unspecified deep veins of left lower extremity: Secondary | ICD-10-CM

## 2018-12-12 DIAGNOSIS — G309 Alzheimer's disease, unspecified: Secondary | ICD-10-CM | POA: Diagnosis not present

## 2018-12-12 DIAGNOSIS — I10 Essential (primary) hypertension: Secondary | ICD-10-CM | POA: Diagnosis not present

## 2018-12-12 DIAGNOSIS — K220 Achalasia of cardia: Secondary | ICD-10-CM

## 2018-12-12 DIAGNOSIS — D473 Essential (hemorrhagic) thrombocythemia: Secondary | ICD-10-CM

## 2018-12-12 DIAGNOSIS — F32A Depression, unspecified: Secondary | ICD-10-CM

## 2018-12-12 DIAGNOSIS — F0280 Dementia in other diseases classified elsewhere without behavioral disturbance: Secondary | ICD-10-CM

## 2018-12-12 NOTE — Assessment & Plan Note (Signed)
Stable, continue Omeprazole 20mg  qd, Nifedipine 10mg  tid.

## 2018-12-12 NOTE — Assessment & Plan Note (Signed)
Stable, continue [facility] for safety, care assistance, Memantine 10mg  bid.

## 2018-12-12 NOTE — Assessment & Plan Note (Signed)
Stable, continue Xarelto. 

## 2018-12-12 NOTE — Progress Notes (Signed)
Location:   SNF [facility] Room Number: 58 Place of Service:  SNF (31) Provider:  Tannisha Kennington NP  Virgie Dad, MD  Patient Care Team: Virgie Dad, MD as PCP - General (Internal Medicine) Annia Belt, MD as Consulting Physician (Hematology and Oncology) Suella Broad, MD as Consulting Physician (Physical Medicine and Rehabilitation) Noralee Space, MD (Pulmonary Disease) [place] Chipper Koudelka X, NP as Nurse Practitioner (Nurse Practitioner) Inda Castle, MD (Inactive) as Consulting Physician (Gastroenterology)  Extended Emergency Contact Information Primary Emergency Contact: Pavelko,Gene Address: [address]          [place], [place] [address] [place] Phone: [phone] Work Phone: [phone] Mobile Phone: [phone] Relation: Son Secondary Emergency Contact: [name], [place] [address] [place] of [place] Phone: [phone] Relation: Son  Code Status:  DNR Goals of care: Advanced Directive information Advanced Directives 12/12/2018  Does Patient Have a Medical Advance Directive? Yes  Type of Paramedic of [place];Living will;Out of facility DNR (pink MOST or yellow form)  Does patient want to make changes to medical advance directive? No - Patient declined  Copy of [place] in Chart? Yes - validated most recent copy scanned in chart (See row information)  Pre-existing out of facility DNR order (yellow form or pink MOST form) Yellow form placed in chart (order not valid for inpatient use);Pink MOST form placed in chart (order not valid for inpatient use)     Chief Complaint  Patient presents with  . Medical Management of Chronic Issues  . Health Maintenance    Influenza vaccine    HPI:  Pt is a 85 y.o. female seen today for medical management of chronic diseases.    The patient resides in SNF [facility] for safety, care assistance,  on Memantine 10mg  bid for memory. Hx of essential thrombocythemia,  plt 293 11/29/18, on Hydrea. Chronic lower back pain, stable, on Norco 10/325mg  bid. HTN, blood pressure is controlled on Metoprolol 25mg  bid. GERD, stable, on Omeprazole 20mg  qd, Nifedipine 10mg  tid. Her mood is stable, on Sertraline 25mg  qd. Hx of DVTs, on Xarelto 15mg  qd.   Past Medical History:  Diagnosis Date  . Abnormal weight gain 05/09/2012  . Achalasia of esophagus 09/18/2013   09/18/13 Nifedipine 10mg  tid ac meals. 11/17/14 [facility] f/u as neeed   . Anxiety and depression 03/12/2012   Increased Sertraline to 50mg  daily 07/16/13 08/26/13 Mirtazapine 7.5mg  qhs  10/12/14 Pharm decreased Sertraline to 25mg .  05/04/15 TSH 2.89 09/15/15 pharm taper off Sertraline.  10/08/15 continue Sertraline 25mg  daily, POA desires. 12/02/15 wbc 6.1, Hgb 12.0, plt 271    . Asthmatic bronchitis   . BACK PAIN, LUMBAR 03/12/2007   Hx of lower back pain, had X-ray of her lumbar spine 09/08/12--spondylosis-better controlled with Hydrocodone/ApAp  10/325mg  ac and hs  01/01/13 lumbar spine inj. Able to ambulate with walker on unit until her fall 06/22/13. 06/25/13 unremarkable. X-ray lumbar spine. 11/10/13 dc Oxycodone prn-not used in 60 days.     . COLONIC POLYPS 03/12/2007   Initially noted April 2002 colonoscopy. Followup colonoscopy 05/31/2006 did not disclose any further polyps.    Marland Kitchen COPD (chronic obstructive pulmonary disease) ([place])   . Coronary artery disease   . Deep vein thrombosis of left lower extremity ([place]) 07/19/2012  . Dementia ([place]) 09/16/2012   05/04/15 TSH 2.89   . Depression   .  Diverticulosis 05/10/2012  . DJD (degenerative joint disease)   . Dry skin dermatitis 11/18/2012  . Edema of leg 07/01/2010  . Epistaxis 01/24/2016   01/21/16: x2, held Xarelto x 1 day, Saline Nasal spray prn, wbc 9.5, Hgb 12.4, Plt 331, Na 138, K 4.5, Bun 25, creat 0.89 01/22/16 [id number])  . Esophageal dysmotility 07/26/2013  . Esophageal dysphagia 05/27/2010   New dx of  achalasia 6/ 2015 09/18/13 Nifedipine 10mg  tid ac meals.    . Esophageal stricture   . Essential hypertension, benign 10/30/2013   12/02/15 wbc 6.1, Hgb 12.0, plt 271   . Essential thrombocythemia ([place]) 06/09/2009  . Fall 06/25/2013  . GERD (gastroesophageal reflux disease)   . Hip fracture ([place]) 09/08/2012  . HYPERCHOLESTEROLEMIA 03/12/2007   08/10/15 cholesterol 119, triglycerides 143, HDL 51, LDL 39   . Lumbar back pain   . MACULAR DEGENERATION 01/26/2008   Qualifier: Diagnosis of  By: Lenna Gilford MD, Deborra Medina   . Osteoarthritis 03/12/2007  . Osteoporosis 03/12/2007   Qualifier: Diagnosis of  By: Julien Girt CMA, Marliss Czar  06/15/14 dc Vit D 1000u po daily per pharm.    . Phlebitis and thrombophlebitis of other deep vessels of lower extremities 07/10/2010  . S/P balloon dilatation of esophageal stricture 07/28/2013   07/26/13. Dr. Henrene Pastor. EGD foreign removal and Balloon dilation of esophagus. Esophageal dysmotility, suspected achalasia, dilation at the GE junction to 76mm. Candida esophagitis.    . SYNCOPE 07/30/2009   Occurred 2011. Hospitalized. Felt due to dehydration.    . TRANSIENT ISCHEMIC ATTACKS, HX OF 03/12/2007   Qualifier: Diagnosis of  By: Julien Girt CMA, Marliss Czar     Past Surgical History:  Procedure Laterality Date  . ABDOMINAL HYSTERECTOMY     and bso  . APPENDECTOMY    . BOTOX INJECTION N/A 09/18/2013   Procedure: BOTOX INJECTION;  Surgeon: Lafayette Dragon, MD;  Location: [facility] ENDOSCOPY;  Service: Endoscopy;  Laterality: N/A;  . BOTOX INJECTION N/A 10/16/2014   Procedure: BOTOX INJECTION;  Surgeon: Inda Castle, MD;  Location: [facility];  Service: Endoscopy;  Laterality: N/A;  . BOTOX INJECTION N/A 05/18/2016   Procedure: BOTOX INJECTION;  Surgeon: Milus Banister, MD;  Location: [facility];  Service: Endoscopy;  Laterality: N/A;  . BOTOX INJECTION N/A 02/14/2018   Procedure: BOTOX INJECTION;  Surgeon: Milus Banister, MD;  Location: [facility];  Service: Endoscopy;  Laterality: N/A;  . CATARACT  EXTRACTION W/ INTRAOCULAR LENS  IMPLANT, BILATERAL    . CHOLECYSTECTOMY    . ESOPHAGEAL MANOMETRY N/A 08/25/2013   Procedure: ESOPHAGEAL MANOMETRY (EM);  Surgeon: Jerene Bears, MD;  Location: [facility];  Service: Gastroenterology;  Laterality: N/A;  . ESOPHAGOGASTRODUODENOSCOPY N/A 07/26/2013   Procedure: ESOPHAGOGASTRODUODENOSCOPY (EGD);  Surgeon: Irene Shipper, MD;  Location: [facility] ENDOSCOPY;  Service: Endoscopy;  Laterality: N/A;  . ESOPHAGOGASTRODUODENOSCOPY N/A 10/16/2014   Procedure: ESOPHAGOGASTRODUODENOSCOPY (EGD);  Surgeon: Inda Castle, MD;  Location: [facility];  Service: Endoscopy;  Laterality: N/A;  with botox  . ESOPHAGOGASTRODUODENOSCOPY (EGD) WITH ESOPHAGEAL DILATION N/A 11/25/2012   Procedure: ESOPHAGOGASTRODUODENOSCOPY (EGD) WITH ESOPHAGEAL DILATION;  Surgeon: Jerene Bears, MD;  [facility]: [facility];  Service: Gastroenterology;  Laterality: N/A;  . ESOPHAGOGASTRODUODENOSCOPY (EGD) WITH PROPOFOL N/A 09/18/2013   Procedure: ESOPHAGOGASTRODUODENOSCOPY (EGD) WITH PROPOFOL;  Surgeon: Lafayette Dragon, MD;  Location: [facility];  Service: Endoscopy;  Laterality: N/A;  . ESOPHAGOGASTRODUODENOSCOPY (EGD) WITH PROPOFOL N/A 05/18/2016   Procedure: ESOPHAGOGASTRODUODENOSCOPY (EGD) WITH PROPOFOL;  Surgeon: Milus Banister, MD;  Location: [facility]  ENDOS[facility];  Service: Endoscopy;  Laterality: N/A;  . ESOPHAGOGASTRODUODENOSCOPY (EGD) WITH PROPOFOL N/A 02/14/2018   Procedure: ESOPHAGOGASTRODUODENOSCOPY (EGD) WITH PROPOFOL;  Surgeon: Milus Banister, MD;  [facility];  Service: Endoscopy;  Laterality: N/A;  . HIP ARTHROPLASTY Right 09/09/2012   Procedure: ARTHROPLASTY BIPOLAR HIP;  Surgeon: Mauri Pole, MD;  Location: [facility];  Service: Orthopedics;  Laterality: Right;  . TAH and BSO  08/21/1995    Allergies  Allergen Reactions  . Sulfamethoxazole Rash    Allergies as of 12/12/2018      Reactions   Sulfamethoxazole Rash      Medication List       Accurate as of December 12, 2018 11:59 PM. If you have any questions, ask your nurse or doctor.        acetaminophen 325 MG tablet Commonly known as: TYLENOL Take 650 mg by mouth every 8 (eight) hours as needed for moderate pain.   albuterol 108 (90 Base) MCG/ACT inhaler Commonly known as: VENTOLIN HFA Inhale 2 puffs into the lungs every 6 (six) hours as needed for wheezing or shortness of breath.   atorvastatin 10 MG tablet Commonly known as: LIPITOR Take 10 mg by mouth at bedtime.   Breo Ellipta 100-25 MCG/INH Aepb Generic drug: fluticasone furoate-vilanterol Inhale 1 puff into the lungs daily. One puff inhale once a day, rinse mouth after each use.   chlorhexidine 0.12 % solution Commonly known as: PERIDEX Use as directed 5 mLs in the mouth or throat daily. SWISH AND SPIT 5ML FOR 30 SECONDS EVERYDAY INDEFINITELY   cholecalciferol 1000 units tablet Commonly known as: VITAMIN D Take 1,000 Units by mouth daily.   Denta 5000 Plus 1.1 % Crea dental cream Generic drug: sodium fluoride Place 1 application onto teeth at bedtime. Apply a thin ribbon   Humidifier Misc Please use humidifier to prevent excessive dryness.   HYDROcodone-acetaminophen 10-325 MG tablet Commonly known as: NORCO Take 1 tablet by mouth SCHEDULED twice daily; may take an additional tablet once daily as needed for increased back pain.HOLD for sedation.   hydroxyurea 500 MG capsule Commonly known as: HYDREA Take 500 mg by mouth. Take 2 capsules=1000 mg on Monday. (Wear gloves when handling medication) May take with food to minimize GI side effects.   hydroxyurea 500 MG capsule Commonly known as: HYDREA Take 500 mg by mouth. TAKE ONE CAP BY MOUTH EVERY SUNDAY, [facility] AND Friday.May take with food to minimize GI side effects.   memantine 10 MG tablet Commonly known as: NAMENDA Take 10 mg by mouth 2 (two) times daily.   metoprolol tartrate 25 MG tablet Commonly known as: LOPRESSOR Take 25 mg by mouth 2 (two) times daily.    montelukast 10 MG tablet Commonly known as: SINGULAIR Take 10 mg by mouth at bedtime.   NIFEdipine 10 MG capsule Commonly known as: PROCARDIA Take 10 mg by mouth 3 (three) times daily before meals. Squeeze contents of one capsule under tongue 15-30 minutes before meals 3 times a day   nitroGLYCERIN 0.4 MG SL tablet Commonly known as: NITROSTAT Place 0.4 mg under the tongue every 5 (five) minutes as needed for chest pain.   omeprazole 20 MG capsule Commonly known as: PRILOSEC Take 20 mg by mouth at bedtime.   PreserVision AREDS 2 Caps Take 1 capsule by mouth 2 (two) times daily.   Rivaroxaban 15 MG Tabs tablet Commonly known as: XARELTO Take 15 mg by mouth daily.   sertraline 25 MG tablet Commonly known as:  ZOLOFT Take 25 mg by mouth daily.   sodium chloride 0.65 % nasal spray Commonly known as: OCEAN Place 1 spray into the nose 2 (two) times daily.      ROS was provided with assistance of staff.  Review of Systems  Constitutional: Negative for activity change, appetite change, chills, diaphoresis, fatigue, fever and unexpected weight change.  HENT: Positive for hearing loss and trouble swallowing. Negative for congestion and voice change.   Respiratory: Negative for cough, shortness of breath and wheezing.   Cardiovascular: Negative for palpitations and leg swelling.  Gastrointestinal: Negative for abdominal distention, abdominal pain, constipation, diarrhea, nausea and vomiting.  Genitourinary: Positive for dysuria. Negative for difficulty urinating and urgency.  Musculoskeletal: Positive for arthralgias, back pain and gait problem.  Skin: Negative for color change and pallor.  Neurological: Negative for dizziness, speech difficulty, weakness and headaches.       Memory lapses.   Psychiatric/Behavioral: Negative for agitation, behavioral problems, hallucinations and sleep disturbance. The patient is not nervous/anxious.     Immunization History  Administered Date(s)  Administered  . Influenza Split 11/16/2010, 12/13/2011  . Influenza Whole 11/27/2009, 11/29/2017  . Influenza-Unspecified 12/31/2013, 11/18/2014, 12/14/2015, 12/11/2016  . PPD Test 09/11/2012  . Pneumococcal Conjugate-13 06/01/2017  . Pneumococcal Polysaccharide-23 02/28/1988  . Tdap 05/31/2017   Pertinent  Health Maintenance Due  Topic Date Due  . INFLUENZA VACCINE  09/28/2018  . DEXA SCAN  Completed  . PNA vac Low Risk Adult  Completed   Fall Risk  02/08/2017 02/08/2017 10/04/2015 12/28/2014 10/23/2014  Falls in the past year? No Exclusion - non ambulatory No Yes Yes  Number falls in past yr: - - - 1 1  Injury with Fall? - - - No No  Risk for fall due to : - Impaired balance/gait;Impaired vision Impaired mobility;Impaired balance/gait History of fall(s);Impaired balance/gait;Impaired mobility Impaired mobility  Follow up - - - Falls prevention discussed Falls evaluation completed   Functional Status Survey:    Vitals:   12/12/18 0927  BP: 130/70  Pulse: 71  Resp: 20  Temp: (!) 97 F (36.1 C)  SpO2: 93%  Weight: 103 lb 14.4 oz (47.1 kg)  Height: 5\' 1"  (1.549 m)   Body mass index is 19.63 kg/m. Physical Exam Vitals signs and nursing note reviewed.  Constitutional:      General: She is not in acute distress.    Appearance: Normal appearance. She is normal weight. She is not ill-appearing, toxic-appearing or diaphoretic.  HENT:     Head: Normocephalic and atraumatic.     Nose: Nose normal.     Mouth/Throat:     Mouth: Mucous membranes are moist.  Eyes:     Extraocular Movements: Extraocular movements intact.     Conjunctiva/sclera: Conjunctivae normal.     Pupils: Pupils are equal, round, and reactive to light.  Neck:     Musculoskeletal: Normal range of motion and neck supple.  Cardiovascular:     Rate and Rhythm: Normal rate and regular rhythm.     Heart sounds: No murmur.  Pulmonary:     Breath sounds: No wheezing, rhonchi or rales.  Abdominal:     General:  Bowel sounds are normal. There is no distension.     Palpations: Abdomen is soft.     Tenderness: There is no abdominal tenderness. There is no right CVA tenderness, left CVA tenderness, guarding or rebound.  Musculoskeletal:     Right lower leg: No edema.     Left lower leg: No  edema.  Skin:    General: Skin is warm and dry.  Neurological:     General: No focal deficit present.     Mental Status: She is alert. Mental status is at baseline.     Motor: No weakness.     Coordination: Coordination normal.     Gait: Gait abnormal.     Comments: Oriented to person, place.  Psychiatric:        Mood and Affect: Mood normal.        Behavior: Behavior normal.        Thought Content: Thought content normal.     Labs reviewed: Recent Labs    10/29/18 11/28/18 11/29/18  NA 141 140 141  K 4.3 5.7* 4.4  CL 107 106 107  CO2 29 26 25   BUN 27* 19 22*  CREATININE 0.8 0.6 0.9  CALCIUM 8.6 8.7 9.3   Recent Labs    04/30/18 07/30/18 10/29/18  AST 16 14 14   ALT 9 7 8   ALKPHOS 70  --  55  PROT 5.8  --  5.4*  ALBUMIN 3.4  --  3.2   Recent Labs    10/29/18 11/28/18 11/29/18  WBC 5.6 5.1 7.1  NEUTROABS  --  2,820 4,743  HGB 11.3* 11.8* 12.4  HCT 34* 35* 37  PLT 235 290 293   Lab Results  Component Value Date   TSH 2.00 09/21/2016   Lab Results  Component Value Date   HGBA1C 5.5 10/16/2014   Lab Results  Component Value Date   CHOL 143 09/18/2017   HDL 50 09/18/2017   LDLCALC 70 09/18/2017   LDLDIRECT 152.3 06/04/2009   TRIG 147 09/18/2017   CHOLHDL 3 09/13/2011    Significant Diagnostic Results in last 30 days:  No results found.  Assessment/Plan Essential hypertension, benign Blood pressure is controlled, continue Metoprolol 25mg  bid.   Deep vein thrombosis of left lower extremity (HCC) Stable, continue Xarelto  Achalasia of esophagus Stable, continue Omeprazole 20mg  qd, Nifedipine 10mg  tid.   Alzheimer's dementia without behavioral disturbance ([place]) Stable,  continue SNF [facility] for safety, care assistance, Memantine 10mg  bid.   Essential thrombocythemia (HCC) Stable, Plt 293 11/29/18, contineu Hydrea  Chronic depression Her mood is stable, continue Sertraline 25mg  qd.   Other chronic pain Lower back pain, controlled, continue Norco 10/325mg  bid.      Family/ staff Communication: plan of care reviewed with the patient and charge nurse.   Labs/tests ordered:  none  Time spend 25 minutes.

## 2018-12-12 NOTE — Assessment & Plan Note (Signed)
Lower back pain, controlled, continue Norco 10/325mg  bid.

## 2018-12-12 NOTE — Assessment & Plan Note (Signed)
Blood pressure is controlled, continue Metoprolol 25mg bid.  

## 2018-12-12 NOTE — Assessment & Plan Note (Signed)
Stable, Plt 293 11/29/18, contineu Hydrea

## 2018-12-12 NOTE — Assessment & Plan Note (Signed)
Her mood is stable, continue Sertraline 25mg qd.  

## 2018-12-23 ENCOUNTER — Other Ambulatory Visit: Payer: Self-pay | Admitting: *Deleted

## 2018-12-23 DIAGNOSIS — M5440 Lumbago with sciatica, unspecified side: Secondary | ICD-10-CM

## 2018-12-23 MED ORDER — HYDROCODONE-ACETAMINOPHEN 10-325 MG PO TABS: ORAL_TABLET | ORAL | 0 refills | Status: DC

## 2018-12-23 NOTE — Telephone Encounter (Signed)
Received fax refill Request from [facility] Pended [facility] and sent to Dr. Gupta for approval.  

## 2018-12-24 DIAGNOSIS — Z03818 Encounter for observation for suspected exposure to other biological agents ruled out: Secondary | ICD-10-CM | POA: Diagnosis not present

## 2018-12-31 DIAGNOSIS — Z20828 Contact with and (suspected) exposure to other viral communicable diseases: Secondary | ICD-10-CM | POA: Diagnosis not present

## 2019-01-07 DIAGNOSIS — Z20828 Contact with and (suspected) exposure to other viral communicable diseases: Secondary | ICD-10-CM | POA: Diagnosis not present

## 2019-01-10 ENCOUNTER — Encounter: Payer: Self-pay | Admitting: Nurse Practitioner

## 2019-01-10 ENCOUNTER — Non-Acute Institutional Stay (SKILLED_NURSING_FACILITY): Payer: Medicare Other | Admitting: Nurse Practitioner

## 2019-01-10 DIAGNOSIS — I10 Essential (primary) hypertension: Secondary | ICD-10-CM

## 2019-01-10 DIAGNOSIS — Z86718 Personal history of other venous thrombosis and embolism: Secondary | ICD-10-CM

## 2019-01-10 DIAGNOSIS — M5440 Lumbago with sciatica, unspecified side: Secondary | ICD-10-CM

## 2019-01-10 DIAGNOSIS — F329 Major depressive disorder, single episode, unspecified: Secondary | ICD-10-CM

## 2019-01-10 DIAGNOSIS — G309 Alzheimer's disease, unspecified: Secondary | ICD-10-CM

## 2019-01-10 DIAGNOSIS — D473 Essential (hemorrhagic) thrombocythemia: Secondary | ICD-10-CM

## 2019-01-10 DIAGNOSIS — K220 Achalasia of cardia: Secondary | ICD-10-CM

## 2019-01-10 DIAGNOSIS — F32A Depression, unspecified: Secondary | ICD-10-CM

## 2019-01-10 DIAGNOSIS — F0280 Dementia in other diseases classified elsewhere without behavioral disturbance: Secondary | ICD-10-CM

## 2019-01-10 DIAGNOSIS — G8929 Other chronic pain: Secondary | ICD-10-CM

## 2019-01-10 NOTE — Progress Notes (Addendum)
Location:   SNF [place] Room Number: 40 Place of Service:  SNF (31) Provider:  Britanie Harshman NP  Virgie Dad, MD  Patient Care Team: Virgie Dad, MD as PCP - General (Internal Medicine) Annia Belt, MD as Consulting Physician (Hematology and Oncology) Suella Broad, MD as Consulting Physician (Physical Medicine and Rehabilitation) Noralee Space, MD (Pulmonary Disease) [place] Mervin Ramires X, NP as Nurse Practitioner (Nurse Practitioner) Inda Castle, MD (Inactive) as Consulting Physician (Gastroenterology)  Extended Emergency Contact Information Primary Emergency Contact: Theiler,Gene Address: [address]          [place], [place] [address] [place] Phone: [phone] Work Phone: [phone] Mobile Phone: [phone] Relation: Son Secondary Emergency Contact: [name], [place] [address] [place] of [place] Phone: [phone] Relation: Son  Code Status:  DNR Goals of care: Advanced Directive information Advanced Directives 01/10/2019  Does Patient Have a Medical Advance Directive? Yes  Type of Advance Directive Out of facility DNR (pink MOST or yellow form);Living will;Healthcare Power of Attorney  Does patient want to make changes to medical advance directive? No - Patient declined  Copy of [place] in Chart? Yes - validated most recent copy scanned in chart (See row information)  Pre-existing out of facility DNR order (yellow form or pink MOST form) Yellow form placed in chart (order not valid for inpatient use);Pink MOST form placed in chart (order not valid for inpatient use)     Chief Complaint  Patient presents with  . Medical Management of Chronic Issues  . Health Maintenance    influenza vaccine    HPI:  Pt is a 85 y.o. female seen today for medical management of chronic diseases.    The patient resides in SNF [facility] for safety, care assistance,  taking Memantine 10mg  bid for memory. Hx of DVT, on Xarelto 15mg  qd. GERD, stable, on Omeprazole 20mg  qd, Nifedipine 10mg  tid with meals. HTN, blood pressure is controlled on Metoprolol 25mg  bid. Essential thrombocythemia , plt 294 11/29/18, on Hydrea. Her mood is stable, on Sertraline 25mg  qd. Lower back pain is managed well, on Norco 10/325 bid, qd prn.    Past Medical History:  Diagnosis Date  . Abnormal weight gain 05/09/2012  . Achalasia of esophagus 09/18/2013   09/18/13 Nifedipine 10mg  tid ac meals. 11/17/14 [facility] f/u as neeed   . Anxiety and depression 03/12/2012   Increased Sertraline to 50mg  daily 07/16/13 08/26/13 Mirtazapine 7.5mg  qhs  10/12/14 Pharm decreased Sertraline to 25mg .  05/04/15 TSH 2.89 09/15/15 pharm taper off Sertraline.  10/08/15 continue Sertraline 25mg  daily, POA desires. 12/02/15 wbc 6.1, Hgb 12.0, plt 271    . Asthmatic bronchitis   . BACK PAIN, LUMBAR 03/12/2007   Hx of lower back pain, had X-ray of her lumbar spine 09/08/12--spondylosis-better controlled with Hydrocodone/ApAp  10/325mg  ac and hs  01/01/13 lumbar spine inj. Able to ambulate with walker on unit until her fall 06/22/13. 06/25/13 unremarkable. X-ray lumbar spine. 11/10/13 dc Oxycodone prn-not used in 60 days.     . COLONIC POLYPS 03/12/2007   Initially noted April 2002 colonoscopy. Followup colonoscopy 05/31/2006 did not disclose any further polyps.    Marland Kitchen COPD (chronic obstructive pulmonary disease) ([place])   . Coronary artery disease   . Deep vein thrombosis of left lower extremity ([place]) 07/19/2012  . Dementia ([place]) 09/16/2012   05/04/15 TSH 2.89   .  Depression   . Diverticulosis 05/10/2012  . DJD (degenerative joint disease)   . Dry skin dermatitis 11/18/2012  . Edema of leg 07/01/2010  . Epistaxis 01/24/2016   01/21/16: x2, held Xarelto x 1 day, Saline Nasal spray prn, wbc 9.5, Hgb 12.4, Plt 331, Na 138, K 4.5, Bun 25, creat 0.89 01/22/16 [id number])  . Esophageal dysmotility 07/26/2013  . Esophageal dysphagia 05/27/2010    New dx of achalasia 6/ 2015 09/18/13 Nifedipine 10mg  tid ac meals.    . Esophageal stricture   . Essential hypertension, benign 10/30/2013   12/02/15 wbc 6.1, Hgb 12.0, plt 271   . Essential thrombocythemia ([place]) 06/09/2009  . Fall 06/25/2013  . GERD (gastroesophageal reflux disease)   . Hip fracture ([place]) 09/08/2012  . HYPERCHOLESTEROLEMIA 03/12/2007   08/10/15 cholesterol 119, triglycerides 143, HDL 51, LDL 39   . Lumbar back pain   . MACULAR DEGENERATION 01/26/2008   Qualifier: Diagnosis of  By: Lenna Gilford MD, Deborra Medina   . Osteoarthritis 03/12/2007  . Osteoporosis 03/12/2007   Qualifier: Diagnosis of  By: Julien Girt CMA, Marliss Czar  06/15/14 dc Vit D 1000u po daily per pharm.    . Phlebitis and thrombophlebitis of other deep vessels of lower extremities 07/10/2010  . S/P balloon dilatation of esophageal stricture 07/28/2013   07/26/13. Dr. Henrene Pastor. EGD foreign removal and Balloon dilation of esophagus. Esophageal dysmotility, suspected achalasia, dilation at the GE junction to 73mm. Candida esophagitis.    . SYNCOPE 07/30/2009   Occurred 2011. Hospitalized. Felt due to dehydration.    . TRANSIENT ISCHEMIC ATTACKS, HX OF 03/12/2007   Qualifier: Diagnosis of  By: Julien Girt CMA, Marliss Czar     Past Surgical History:  Procedure Laterality Date  . ABDOMINAL HYSTERECTOMY     and bso  . APPENDECTOMY    . BOTOX INJECTION N/A 09/18/2013   Procedure: BOTOX INJECTION;  Surgeon: Lafayette Dragon, MD;  Location: [facility];  Service: Endoscopy;  Laterality: N/A;  . BOTOX INJECTION N/A 10/16/2014   Procedure: BOTOX INJECTION;  Surgeon: Inda Castle, MD;  Location: [facility];  Service: Endoscopy;  Laterality: N/A;  . BOTOX INJECTION N/A 05/18/2016   Procedure: BOTOX INJECTION;  Surgeon: Milus Banister, MD;  Location: [facility];  Service: Endoscopy;  Laterality: N/A;  . BOTOX INJECTION N/A 02/14/2018   Procedure: BOTOX INJECTION;  Surgeon: Milus Banister, MD;  Location: [facility];  Service: Endoscopy;  Laterality: N/A;  .  CATARACT EXTRACTION W/ INTRAOCULAR LENS  IMPLANT, BILATERAL    . CHOLECYSTECTOMY    . ESOPHAGEAL MANOMETRY N/A 08/25/2013   Procedure: ESOPHAGEAL MANOMETRY (EM);  Surgeon: Jerene Bears, MD;  Location: [facility];  Service: Gastroenterology;  Laterality: N/A;  . ESOPHAGOGASTRODUODENOSCOPY N/A 07/26/2013   Procedure: ESOPHAGOGASTRODUODENOSCOPY (EGD);  Surgeon: Irene Shipper, MD;  Location[facility] ENDOSCOPY;  Service: Endoscopy;  Laterality: N/A;  . ESOPHAGOGASTRODUODENOSCOPY N/A 10/16/2014   Procedure: ESOPHAGOGASTRODUODENOSCOPY (EGD);  Surgeon: Inda Castle, MD;  Location: [facility];  Service: Endoscopy;  Laterality: N/A;  with botox  . ESOPHAGOGASTRODUODENOSCOPY (EGD) WITH ESOPHAGEAL DILATION N/A 11/25/2012   Procedure: ESOPHAGOGASTRODUODENOSCOPY (EGD) WITH ESOPHAGEAL DILATION;  Surgeon: Jerene Bears, MD;  Location: [facility];  Service: Gastroenterology;  Laterality: N/A;  . ESOPHAGOGASTRODUODENOSCOPY (EGD) WITH PROPOFOL N/A 09/18/2013   Procedure: ESOPHAGOGASTRODUODENOSCOPY (EGD) WITH PROPOFOL;  Surgeon: Lafayette Dragon, MD;  Location: [facility];  Service: Endoscopy;  Laterality: N/A;  . ESOPHAGOGASTRODUODENOSCOPY (EGD) WITH PROPOFOL N/A 05/18/2016   Procedure: ESOPHAGOGASTRODUODENOSCOPY (EGD) WITH PROPOFOL;  Surgeon: Milus Banister,  MD;  Location: [facility];  Service: Endoscopy;  Laterality: N/A;  . ESOPHAGOGASTRODUODENOSCOPY (EGD) WITH PROPOFOL N/A 02/14/2018   Procedure: ESOPHAGOGASTRODUODENOSCOPY (EGD) WITH PROPOFOL;  Surgeon: Milus Banister, MD;  Location: [facility];  Service: Endoscopy;  Laterality: N/A;  . HIP ARTHROPLASTY Right 09/09/2012   Procedure: ARTHROPLASTY BIPOLAR HIP;  Surgeon: Mauri Pole, MD;  Location: [facility];  Service: Orthopedics;  Laterality: Right;  . TAH and BSO  08/21/1995    Allergies  Allergen Reactions  . Sulfamethoxazole Rash    Allergies as of 01/10/2019      Reactions   Sulfamethoxazole Rash      Medication List       Accurate as of  January 10, 2019 11:59 PM. If you have any questions, ask your nurse or doctor.        acetaminophen 325 MG tablet Commonly known as: TYLENOL Take 650 mg by mouth every 8 (eight) hours as needed for moderate pain.   albuterol 108 (90 Base) MCG/ACT inhaler Commonly known as: VENTOLIN HFA Inhale 2 puffs into the lungs every 6 (six) hours as needed for wheezing or shortness of breath.   atorvastatin 10 MG tablet Commonly known as: LIPITOR Take 10 mg by mouth at bedtime.   Breo Ellipta 100-25 MCG/INH Aepb Generic drug: fluticasone furoate-vilanterol Inhale 1 puff into the lungs daily. One puff inhale once a day, rinse mouth after each use.   chlorhexidine 0.12 % solution Commonly known as: PERIDEX Use as directed 5 mLs in the mouth or throat daily. SWISH AND SPIT 5ML FOR 30 SECONDS EVERYDAY INDEFINITELY   cholecalciferol 1000 units tablet Commonly known as: VITAMIN D Take 1,000 Units by mouth daily.   Denta 5000 Plus 1.1 % Crea dental cream Generic drug: sodium fluoride Place 1 application onto teeth at bedtime. Apply a thin ribbon   Humidifier Misc Please use humidifier to prevent excessive dryness.   HYDROcodone-acetaminophen 10-325 MG tablet Commonly known as: NORCO Take 1 tablet by mouth SCHEDULED twice daily; may take an additional tablet once daily as needed for increased back pain.HOLD for sedation.   hydroxyurea 500 MG capsule Commonly known as: HYDREA Take 500 mg by mouth. Take 2 capsules=1000 mg on Monday. (Wear gloves when handling medication) May take with food to minimize GI side effects.   hydroxyurea 500 MG capsule Commonly known as: HYDREA Take 500 mg by mouth. TAKE ONE CAP BY MOUTH EVERY SUNDAY, [facility] AND Friday.May take with food to minimize GI side effects.   memantine 10 MG tablet Commonly known as: NAMENDA Take 10 mg by mouth 2 (two) times daily.   metoprolol tartrate 25 MG tablet Commonly known as: LOPRESSOR Take 25 mg by mouth 2 (two) times  daily.   montelukast 10 MG tablet Commonly known as: SINGULAIR Take 10 mg by mouth at bedtime.   NIFEdipine 10 MG capsule Commonly known as: PROCARDIA Take 10 mg by mouth 3 (three) times daily before meals. Squeeze contents of one capsule under tongue 15-30 minutes before meals 3 times a day   nitroGLYCERIN 0.4 MG SL tablet Commonly known as: NITROSTAT Place 0.4 mg under the tongue every 5 (five) minutes as needed for chest pain.   omeprazole 20 MG capsule Commonly known as: PRILOSEC Take 20 mg by mouth at bedtime.   PreserVision AREDS 2 Caps Take 1 capsule by mouth 2 (two) times daily.   Rivaroxaban 15 MG Tabs tablet Commonly known as: XARELTO Take 15 mg by mouth daily.   sertraline 25 MG  tablet Commonly known as: ZOLOFT Take 25 mg by mouth daily.   sodium chloride 0.65 % nasal spray Commonly known as: OCEAN Place 1 spray into the nose 2 (two) times daily.      ROS was provided with assistance of staff.  Review of Systems  Constitutional: Negative for activity change, appetite change, chills, diaphoresis, fatigue, fever and unexpected weight change.  HENT: Positive for hearing loss and trouble swallowing. Negative for congestion.   Eyes: Negative for visual disturbance.  Respiratory: Negative for cough, shortness of breath and wheezing.   Cardiovascular: Negative for chest pain, palpitations and leg swelling.  Gastrointestinal: Negative for abdominal distention, abdominal pain, constipation, diarrhea, nausea and vomiting.  Genitourinary: Positive for urgency. Negative for difficulty urinating and dysuria.  Musculoskeletal: Positive for arthralgias, back pain and gait problem.  Skin: Negative for color change.  Neurological: Negative for dizziness, speech difficulty, weakness and headaches.       Dementia  Psychiatric/Behavioral: Negative for agitation, hallucinations and sleep disturbance. The patient is not nervous/anxious.     Immunization History  Administered  Date(s) Administered  . Influenza Split 11/16/2010, 12/13/2011  . Influenza Whole 11/27/2009, 11/29/2017  . Influenza-Unspecified 12/31/2013, 11/18/2014, 12/14/2015, 12/11/2016  . PPD Test 09/11/2012  . Pneumococcal Conjugate-13 06/01/2017  . Pneumococcal Polysaccharide-23 02/28/1988  . Tdap 05/31/2017   Pertinent  Health Maintenance Due  Topic Date Due  . INFLUENZA VACCINE  09/28/2018  . DEXA SCAN  Completed  . PNA vac Low Risk Adult  Completed   Fall Risk  02/08/2017 02/08/2017 10/04/2015 12/28/2014 10/23/2014  Falls in the past year? No Exclusion - non ambulatory No Yes Yes  Number falls in past yr: - - - 1 1  Injury with Fall? - - - No No  Risk for fall due to : - Impaired balance/gait;Impaired vision Impaired mobility;Impaired balance/gait History of fall(s);Impaired balance/gait;Impaired mobility Impaired mobility  Follow up - - - Falls prevention discussed Falls evaluation completed   Functional Status Survey:    Vitals:   01/10/19 1341  BP: 130/68  Pulse: 72  Resp: 18  Temp: (!) 96 F (35.6 C)  SpO2: 93%  Weight: 104 lb 4.8 oz (47.3 kg)  Height: 5\' 1"  (1.549 m)   Body mass index is 19.71 kg/m. Physical Exam Vitals signs reviewed.  Constitutional:      General: She is not in acute distress.    Appearance: She is not ill-appearing or toxic-appearing.  HENT:     Head: Normocephalic and atraumatic.     Nose: Nose normal.     Mouth/Throat:     Mouth: Mucous membranes are moist.  Eyes:     Extraocular Movements: Extraocular movements intact.     Conjunctiva/sclera: Conjunctivae normal.     Pupils: Pupils are equal, round, and reactive to light.  Neck:     Musculoskeletal: Normal range of motion and neck supple.  Cardiovascular:     Rate and Rhythm: Normal rate and regular rhythm.     Heart sounds: No murmur.     Comments: No DP pulses palpated upon my examination Pulmonary:     Breath sounds: No wheezing, rhonchi or rales.  Abdominal:     General: Bowel  sounds are normal.     Palpations: Abdomen is soft.     Tenderness: There is no abdominal tenderness. There is no right CVA tenderness, left CVA tenderness, guarding or rebound.  Musculoskeletal:     Right lower leg: No edema.     Left lower leg: No  edema.  Skin:    General: Skin is warm and dry.     Findings: Erythema present.     Comments: Mld erythematous R+L shins.   Neurological:     General: No focal deficit present.     Mental Status: She is alert. Mental status is at baseline.     Motor: No weakness.     Coordination: Coordination normal.     Gait: Gait abnormal.     Comments: Oriented to person, place.   Psychiatric:        Mood and Affect: Mood normal.        Behavior: Behavior normal.     Labs reviewed: Recent Labs    10/29/18 11/28/18 11/29/18  NA 141 140 141  K 4.3 5.7* 4.4  CL 107 106 107  CO2 29 26 25   BUN 27* 19 22*  CREATININE 0.8 0.6 0.9  CALCIUM 8.6 8.7 9.3   Recent Labs    04/30/18 07/30/18 10/29/18  AST 16 14 14   ALT 9 7 8   ALKPHOS 70  --  55  PROT 5.8  --  5.4*  ALBUMIN 3.4  --  3.2   Recent Labs    10/29/18 11/28/18 11/29/18  WBC 5.6 5.1 7.1  NEUTROABS  --  2,820 4,743  HGB 11.3* 11.8* 12.4  HCT 34* 35* 37  PLT 235 290 293   Lab Results  Component Value Date   TSH 2.00 09/21/2016   Lab Results  Component Value Date   HGBA1C 5.5 10/16/2014   Lab Results  Component Value Date   CHOL 143 09/18/2017   HDL 50 09/18/2017   LDLCALC 70 09/18/2017   LDLDIRECT 152.3 06/04/2009   TRIG 147 09/18/2017   CHOLHDL 3 09/13/2011    Significant Diagnostic Results in last 30 days:  No results found.  Assessment/Plan Essential thrombocythemia (HCC) Stable, last plt 294, continue Hydrea.   History of DVT (deep vein thrombosis) Continue long term anticoagulation treatment Xarelto 15mg  qd.   Chronic depression Her mood is stable, continue Sertraline 25mg  qd.   BACK PAIN, LUMBAR Stable, continue current tx.   Alzheimer's dementia  without behavioral disturbance ([place]) Continue SNF [facility] for safety, care assistance, continue Memantine 10mg  bid.   Achalasia of esophagus Stable, continue Omeprazole, Nifedipine.   Essential hypertension, benign Blood pressure is controlled, continue Metoprolol 25mg  bid.      Family/ staff Communication: plan of care reviewed with the patient  Labs/tests ordered:  none  Time spend 25 minutes.

## 2019-01-13 ENCOUNTER — Encounter: Payer: Self-pay | Admitting: Nurse Practitioner

## 2019-01-13 NOTE — Assessment & Plan Note (Signed)
Continue long term anticoagulation treatment Xarelto 15mg  qd.

## 2019-01-13 NOTE — Assessment & Plan Note (Signed)
Stable, last plt 294, continue Hydrea.

## 2019-01-13 NOTE — Assessment & Plan Note (Signed)
Stable, continue Omeprazole, Nifedipine.

## 2019-01-13 NOTE — Assessment & Plan Note (Signed)
Blood pressure is controlled, continue Metoprolol 25mg bid.  

## 2019-01-13 NOTE — Assessment & Plan Note (Signed)
Continue SNF [facility] for safety, care assistance, continue Memantine 10mg  bid.

## 2019-01-13 NOTE — Assessment & Plan Note (Signed)
Her mood is stable, continue Sertraline 25mg qd.  

## 2019-01-13 NOTE — Assessment & Plan Note (Signed)
- 

## 2019-01-14 DIAGNOSIS — Z20828 Contact with and (suspected) exposure to other viral communicable diseases: Secondary | ICD-10-CM | POA: Diagnosis not present

## 2019-01-21 DIAGNOSIS — Z20828 Contact with and (suspected) exposure to other viral communicable diseases: Secondary | ICD-10-CM | POA: Diagnosis not present

## 2019-01-28 DIAGNOSIS — Z20828 Contact with and (suspected) exposure to other viral communicable diseases: Secondary | ICD-10-CM | POA: Diagnosis not present

## 2019-01-28 DIAGNOSIS — D638 Anemia in other chronic diseases classified elsewhere: Secondary | ICD-10-CM | POA: Diagnosis not present

## 2019-01-28 LAB — HEPATIC FUNCTION PANEL
ALT: 9 (ref 7–35)
AST: 15 (ref 13–35)
Alkaline Phosphatase: 62 (ref 25–125)
Bilirubin, Total: 0.4

## 2019-01-28 LAB — BASIC METABOLIC PANEL
BUN: 22 — AB (ref 4–21)
CO2: 27 — AB (ref 13–22)
Chloride: 106 (ref 99–108)
Creatinine: 0.7 (ref 0.5–1.1)
Glucose: 88
Potassium: 4.3 (ref 3.4–5.3)
Sodium: 142 (ref 137–147)

## 2019-01-28 LAB — CBC AND DIFFERENTIAL
HCT: 34 — AB (ref 36–46)
Hemoglobin: 11.5 — AB (ref 12.0–16.0)
Neutrophils Absolute: 3231
Platelets: 289 (ref 150–399)
WBC: 5.6

## 2019-01-28 LAB — CBC: RBC: 3.19 — AB (ref 3.87–5.11)

## 2019-02-07 ENCOUNTER — Other Ambulatory Visit: Payer: Self-pay | Admitting: *Deleted

## 2019-02-07 DIAGNOSIS — M5440 Lumbago with sciatica, unspecified side: Secondary | ICD-10-CM

## 2019-02-07 MED ORDER — HYDROCODONE-ACETAMINOPHEN 10-325 MG PO TABS: ORAL_TABLET | ORAL | 0 refills | Status: DC

## 2019-02-11 DIAGNOSIS — Z20828 Contact with and (suspected) exposure to other viral communicable diseases: Secondary | ICD-10-CM | POA: Diagnosis not present

## 2019-02-13 ENCOUNTER — Encounter: Payer: Self-pay | Admitting: Internal Medicine

## 2019-02-13 ENCOUNTER — Non-Acute Institutional Stay (SKILLED_NURSING_FACILITY): Payer: Medicare Other | Admitting: Internal Medicine

## 2019-02-13 DIAGNOSIS — D473 Essential (hemorrhagic) thrombocythemia: Secondary | ICD-10-CM

## 2019-02-13 DIAGNOSIS — G309 Alzheimer's disease, unspecified: Secondary | ICD-10-CM

## 2019-02-13 DIAGNOSIS — U071 COVID-19: Secondary | ICD-10-CM

## 2019-02-13 DIAGNOSIS — Z86718 Personal history of other venous thrombosis and embolism: Secondary | ICD-10-CM

## 2019-02-13 DIAGNOSIS — F0280 Dementia in other diseases classified elsewhere without behavioral disturbance: Secondary | ICD-10-CM

## 2019-02-13 NOTE — Progress Notes (Signed)
Location:   [facility] Room Number: 35 Place of Service:  SNF [phone]) Provider: Virgie Dad, MD Virgie Dad, MD  Patient Care Team: Virgie Dad, MD as PCP - General (Internal Medicine) Annia Belt, MD as Consulting Physician (Hematology and Oncology) Suella Broad, MD as Consulting Physician (Physical Medicine and Rehabilitation) Noralee Space, MD (Pulmonary Disease) [name], [name], NP as Nurse Practitioner (Nurse Practitioner) Inda Castle, MD (Inactive) as Consulting Physician (Gastroenterology)  Extended Emergency Contact Information Primary Emergency Contact: Cundy,Gene Address: [address]          [place], [place] [address] [place] Phone: [phone] Work Phone: [phone] Mobile Phone: [phone] Relation: Son Secondary Emergency Contact: [name], [place] [address] [place] of [place] Phone: [phone] Relation: Son  Code Status:  DNR Goals of care: Advanced Directive information Advanced Directives 01/10/2019  Does Patient Have a Medical Advance Directive? Yes  Type of Advance Directive Out of facility DNR (pink MOST or yellow form);Living will;Healthcare Power of Attorney  Does patient want to make changes to medical advance directive? No - Patient declined  Copy of [place] in Chart? Yes - validated most recent copy scanned in chart (See row information)  Pre-existing out of facility DNR order (yellow form or pink MOST form) Yellow form placed in chart (order not valid for inpatient use);Pink MOST form placed in chart (order not valid for inpatient use)     Chief Complaint  Patient presents with  . Acute Visit    Positive for COVID    HPI:  Pt is a 85 y.o. female seen today for an acute visit for Positive Covid  She is Long term resident of facility She has h/o Hypertension,Essential Thrombocythemia, H/o  Esophageal Stricture, Achalasia,DVT, Hyperlipidemia, Arthritis, Cognitive Impairement, COPD  She was tested for Covid due to Positive staff member Her Covid came positive Her Only symptom is Coughing No SOB or Hypoxia. No Fever. Does have decreased Appetite. No Nausea. Did have one episode of Diarrhea   Past Medical History:  Diagnosis Date  . Abnormal weight gain 05/09/2012  . Achalasia of esophagus 09/18/2013   09/18/13 Nifedipine 10mg  tid ac meals. 11/17/14 [facility] f/u as neeed   . Anxiety and depression 03/12/2012   Increased Sertraline to 50mg  daily 07/16/13 08/26/13 Mirtazapine 7.5mg  qhs  10/12/14 Pharm decreased Sertraline to 25mg .  05/04/15 TSH 2.89 09/15/15 pharm taper off Sertraline.  10/08/15 continue Sertraline 25mg  daily, POA desires. 12/02/15 wbc 6.1, Hgb 12.0, plt 271    . Asthmatic bronchitis   . BACK PAIN, LUMBAR 03/12/2007   Hx of lower back pain, had X-ray of her lumbar spine 09/08/12--spondylosis-better controlled with Hydrocodone/ApAp  10/325mg  ac and hs  01/01/13 lumbar spine inj. Able to ambulate with walker on unit until her fall 06/22/13. 06/25/13 unremarkable. X-ray lumbar spine. 11/10/13 dc Oxycodone prn-not used in 60 days.     . COLONIC POLYPS 03/12/2007   Initially noted April 2002 colonoscopy. Followup colonoscopy 05/31/2006 did not disclose any further polyps.    Marland Kitchen COPD (chronic obstructive pulmonary disease) ([place])   . Coronary artery disease   . Deep vein thrombosis of left lower extremity ([place]) 07/19/2012  . Dementia ([place]) 09/16/2012   05/04/15 TSH 2.89   . Depression   . Diverticulosis 05/10/2012  . DJD (degenerative joint disease)   . Dry skin dermatitis 11/18/2012  .  Edema of leg 07/01/2010  . Epistaxis 01/24/2016   01/21/16: x2, held Xarelto x 1 day, Saline Nasal spray prn, wbc 9.5, Hgb 12.4, Plt 331, Na 138, K 4.5, Bun 25, creat 0.89 01/22/16 [id number])  . Esophageal dysmotility 07/26/2013  . Esophageal dysphagia 05/27/2010   New dx of achalasia 6/ 2015 09/18/13  Nifedipine 10mg  tid ac meals.    . Esophageal stricture   . Essential hypertension, benign 10/30/2013   12/02/15 wbc 6.1, Hgb 12.0, plt 271   . Essential thrombocythemia ([place]) 06/09/2009  . Fall 06/25/2013  . GERD (gastroesophageal reflux disease)   . Hip fracture ([place]) 09/08/2012  . HYPERCHOLESTEROLEMIA 03/12/2007   08/10/15 cholesterol 119, triglycerides 143, HDL 51, LDL 39   . Lumbar back pain   . MACULAR DEGENERATION 01/26/2008   Qualifier: Diagnosis of  By: Lenna Gilford MD, Deborra Medina   . Osteoarthritis 03/12/2007  . Osteoporosis 03/12/2007   Qualifier: Diagnosis of  By: Julien Girt CMA, Marliss Czar  06/15/14 dc Vit D 1000u po daily per pharm.    . Phlebitis and thrombophlebitis of other deep vessels of lower extremities 07/10/2010  . S/P balloon dilatation of esophageal stricture 07/28/2013   07/26/13. Dr. Henrene Pastor. EGD foreign removal and Balloon dilation of esophagus. Esophageal dysmotility, suspected achalasia, dilation at the GE junction to 57mm. Candida esophagitis.    . SYNCOPE 07/30/2009   Occurred 2011. Hospitalized. Felt due to dehydration.    . TRANSIENT ISCHEMIC ATTACKS, HX OF 03/12/2007   Qualifier: Diagnosis of  By: Julien Girt CMA, Marliss Czar     Past Surgical History:  Procedure Laterality Date  . ABDOMINAL HYSTERECTOMY     and bso  . APPENDECTOMY    . BOTOX INJECTION N/A 09/18/2013   Procedure: BOTOX INJECTION;  Surgeon: Lafayette Dragon, MD;  Location: [facility];  Service: Endoscopy;  Laterality: N/A;  . BOTOX INJECTION N/A 10/16/2014   Procedure: BOTOX INJECTION;  Surgeon: Inda Castle, MD;  Location: [facility];  Service: Endoscopy;  Laterality: N/A;  . BOTOX INJECTION N/A 05/18/2016   Procedure: BOTOX INJECTION;  Surgeon: Milus Banister, MD;  Location: [facility];  Service: Endoscopy;  Laterality: N/A;  . BOTOX INJECTION N/A 02/14/2018   Procedure: BOTOX INJECTION;  Surgeon: Milus Banister, MD;  Location: [facility];  Service: Endoscopy;  Laterality: N/A;  . CATARACT EXTRACTION W/ INTRAOCULAR  LENS  IMPLANT, BILATERAL    . CHOLECYSTECTOMY    . ESOPHAGEAL MANOMETRY N/A 08/25/2013   Procedure: ESOPHAGEAL MANOMETRY (EM);  Surgeon: Jerene Bears, MD;  Location: [facility];  Service: Gastroenterology;  Laterality: N/A;  . ESOPHAGOGASTRODUODENOSCOPY N/A 07/26/2013   Procedure: ESOPHAGOGASTRODUODENOSCOPY (EGD);  Surgeon: Irene Shipper, MD;  [facility];  Service: Endoscopy;  Laterality: N/A;  . ESOPHAGOGASTRODUODENOSCOPY N/A 10/16/2014   Procedure: ESOPHAGOGASTRODUODENOSCOPY (EGD);  Surgeon: Inda Castle, MD;  Location: [facility];  Service: Endoscopy;  Laterality: N/A;  with botox  . ESOPHAGOGASTRODUODENOSCOPY (EGD) WITH ESOPHAGEAL DILATION N/A 11/25/2012   Procedure: ESOPHAGOGASTRODUODENOSCOPY (EGD) WITH ESOPHAGEAL DILATION;  Surgeon: Jerene Bears, MD;  [facility];  Service: Gastroenterology;  Laterality: N/A;  . ESOPHAGOGASTRODUODENOSCOPY (EGD) WITH PROPOFOL N/A 09/18/2013   Procedure: ESOPHAGOGASTRODUODENOSCOPY (EGD) WITH PROPOFOL;  Surgeon: Lafayette Dragon, MD;  Location: [facility];  Service: Endoscopy;  Laterality: N/A;  . ESOPHAGOGASTRODUODENOSCOPY (EGD) WITH PROPOFOL N/A 05/18/2016   Procedure: ESOPHAGOGASTRODUODENOSCOPY (EGD) WITH PROPOFOL;  Surgeon: Milus Banister, MD;  Location[facility];  Service: Endoscopy;  Laterality: N/A;  . ESOPHAGOGASTRODUODENOSCOPY (EGD) WITH PROPOFOL N/A 02/14/2018  Procedure: ESOPHAGOGASTRODUODENOSCOPY (EGD) WITH PROPOFOL;  Surgeon: Milus Banister, MD;  Location: [facility];  Service: Endoscopy;  Laterality: N/A;  . HIP ARTHROPLASTY Right 09/09/2012   Procedure: ARTHROPLASTY BIPOLAR HIP;  Surgeon: Mauri Pole, MD;  Location: [facility];  Service: Orthopedics;  Laterality: Right;  . TAH and BSO  08/21/1995    Allergies  Allergen Reactions  . Sulfamethoxazole Rash    Allergies as of 02/13/2019      Reactions   Sulfamethoxazole Rash      Medication List       Accurate as of February 13, 2019  9:17 AM. If you have  any questions, ask your nurse or doctor.        acetaminophen 325 MG tablet Commonly known as: TYLENOL Take 650 mg by mouth every 8 (eight) hours as needed for moderate pain.   albuterol 108 (90 Base) MCG/ACT inhaler Commonly known as: VENTOLIN HFA Inhale 2 puffs into the lungs every 6 (six) hours as needed for wheezing or shortness of breath.   atorvastatin 10 MG tablet Commonly known as: LIPITOR Take 10 mg by mouth at bedtime.   Breo Ellipta 100-25 MCG/INH Aepb Generic drug: fluticasone furoate-vilanterol Inhale 1 puff into the lungs daily. One puff inhale once a day, rinse mouth after each use.   chlorhexidine 0.12 % solution Commonly known as: PERIDEX Use as directed 5 mLs in the mouth or throat daily. SWISH AND SPIT 5ML FOR 30 SECONDS EVERYDAY INDEFINITELY   cholecalciferol 1000 units tablet Commonly known as: VITAMIN D Take 1,000 Units by mouth daily.   Denta 5000 Plus 1.1 % Crea dental cream Generic drug: sodium fluoride Place 1 application onto teeth at bedtime. Apply a thin ribbon   Humidifier Misc Please use humidifier to prevent excessive dryness.   HYDROcodone-acetaminophen 10-325 MG tablet Commonly known as: NORCO Take 1 tablet by mouth SCHEDULED twice daily; may take an additional tablet once daily as needed for increased back pain.HOLD for sedation.   hydroxyurea 500 MG capsule Commonly known as: HYDREA Take 500 mg by mouth. Take 2 capsules=1000 mg on Monday. (Wear gloves when handling medication) May take with food to minimize GI side effects.   hydroxyurea 500 MG capsule Commonly known as: HYDREA Take 500 mg by mouth. TAKE ONE CAP BY MOUTH EVERY SUNDAY, [facility] AND Friday.May take with food to minimize GI side effects.   loperamide 2 MG capsule Commonly known as: IMODIUM Take 4 mg by mouth. Check rectum for retained stool. If >3 loose stools in 24 hrs., hold all lax. and stool softeners. Give Imodium AD (loperamide) 4 mg po 1st dose, then 2 mg po  after each loose stool X 48 hrs. Not to exceed 16mg  in 24 hrs. If not effective, notify MD. Do n As Needed   memantine 10 MG tablet Commonly known as: NAMENDA Take 10 mg by mouth 2 (two) times daily.   metoprolol tartrate 25 MG tablet Commonly known as: LOPRESSOR Take 25 mg by mouth 2 (two) times daily.   montelukast 10 MG tablet Commonly known as: SINGULAIR Take 10 mg by mouth at bedtime.   NIFEdipine 10 MG capsule Commonly known as: PROCARDIA Take 10 mg by mouth 3 (three) times daily before meals. Squeeze contents of one capsule under tongue 15-30 minutes before meals 3 times a day   nitroGLYCERIN 0.4 MG SL tablet Commonly known as: NITROSTAT Place 0.4 mg under the tongue every 5 (five) minutes as needed for chest pain.   omeprazole 20 MG  capsule Commonly known as: PRILOSEC Take 20 mg by mouth at bedtime.   PreserVision AREDS 2 Caps Take 1 capsule by mouth 2 (two) times daily.   Rivaroxaban 15 MG Tabs tablet Commonly known as: XARELTO Take 15 mg by mouth daily.   sertraline 25 MG tablet Commonly known as: ZOLOFT Take 25 mg by mouth daily.   sodium chloride 0.65 % nasal spray Commonly known as: OCEAN Place 1 spray into the nose 2 (two) times daily.       Review of Systems  Constitutional: Positive for appetite change.  HENT: Negative.   Respiratory: Positive for cough.   Cardiovascular: Negative.   Gastrointestinal: Negative.   Genitourinary: Negative.   Musculoskeletal: Negative.   Skin: Negative.   Neurological: Positive for weakness.  Psychiatric/Behavioral: Negative.   All other systems reviewed and are negative.   Immunization History  Administered Date(s) Administered  . Influenza Split 11/16/2010, 12/13/2011  . Influenza Whole 11/27/2009, 11/29/2017  . Influenza, High Dose Seasonal PF 11/29/2018  . Influenza-Unspecified 12/31/2013, 11/18/2014, 12/14/2015, 12/11/2016  . PPD Test 09/11/2012  . Pneumococcal Conjugate-13 06/01/2017  .  Pneumococcal Polysaccharide-23 02/28/1988  . Tdap 05/31/2017   Pertinent  Health Maintenance Due  Topic Date Due  . INFLUENZA VACCINE  Completed  . DEXA SCAN  Completed  . PNA vac Low Risk Adult  Completed   Fall Risk  02/08/2017 02/08/2017 10/04/2015 12/28/2014 10/23/2014  Falls in the past year? No Exclusion - non ambulatory No Yes Yes  Number falls in past yr: - - - 1 1  Injury with Fall? - - - No No  Risk for fall due to : - Impaired balance/gait;Impaired vision Impaired mobility;Impaired balance/gait History of fall(s);Impaired balance/gait;Impaired mobility Impaired mobility  Follow up - - - Falls prevention discussed Falls evaluation completed   Functional Status Survey:    Vitals:   02/13/19 0910  BP: 130/80  Pulse: 84  Resp: 18  Temp: (!) 97.1 F (36.2 C)  SpO2: 95%  Weight: 104 lb 9.6 oz (47.4 kg)  Height: 5\' 1"  (1.549 m)   Body mass index is 19.76 kg/m. Physical Exam Vitals reviewed.  Constitutional:      Appearance: Normal appearance.  HENT:     Head: Normocephalic.     Nose: Nose normal.     Mouth/Throat:     Mouth: Mucous membranes are moist.     Pharynx: Oropharynx is clear.  Eyes:     Pupils: Pupils are equal, round, and reactive to light.  Cardiovascular:     Rate and Rhythm: Normal rate and regular rhythm.     Pulses: Normal pulses.  Pulmonary:     Effort: Pulmonary effort is normal. No respiratory distress.     Breath sounds: Normal breath sounds. No wheezing or rales.  Abdominal:     General: Abdomen is flat. Bowel sounds are normal.     Palpations: Abdomen is soft.  Musculoskeletal:        General: No swelling.     Cervical back: Neck supple.  Skin:    General: Skin is warm.  Neurological:     General: No focal deficit present.     Mental Status: She is alert.  Psychiatric:        Mood and Affect: Mood normal.        Thought Content: Thought content normal.     Labs reviewed: Recent Labs    10/29/18 0000 11/28/18 0000  11/29/18 0000 01/28/19 0000  NA 141 140 141 142  K 4.3 5.7* 4.4 4.3  CL 107 106 107 106  CO2 29 26 25  27*  BUN 27* 19 22* 22*  CREATININE 0.8 0.6 0.9 0.7  CALCIUM 8.6 8.7 9.3  --    Recent Labs    04/30/18 0000 07/30/18 0000 10/29/18 0000 01/28/19 0000  AST 16 14 14 15   ALT 9 7 8 9   ALKPHOS 70  --  55 62  PROT 5.8  --  5.4*  --   ALBUMIN 3.4  --  3.2  --    Recent Labs    11/28/18 0000 11/29/18 0000 01/28/19 0000  WBC 5.1 7.1 5.6  NEUTROABS 2,820 4,743 3,231  HGB 11.8* 12.4 11.5*  HCT 35* 37 34*  PLT 290 293 289   Lab Results  Component Value Date   TSH 2.00 09/21/2016   Lab Results  Component Value Date   HGBA1C 5.5 10/16/2014   Lab Results  Component Value Date   CHOL 143 09/18/2017   HDL 50 09/18/2017   LDLCALC 70 09/18/2017   LDLDIRECT 152.3 06/04/2009   TRIG 147 09/18/2017   CHOLHDL 3 09/13/2011    Significant Diagnostic Results in last 30 days:  No results found.  Assessment/Plan CoVid Positive with Cough Start on Vit C and Zinc Already on Vit D Check CMP< CBC and Ferritin and LDH Also Check Chest Xray in the Morning Will consider her for Monoclonal Antibody Infusion as now available in facility for Infusion Will d/w the family and Nurses Already On Xarelto for her DVT Essential Hypertension On Lopressor Essential Thrombocythemia Continue Hydrea Repeat CBC Chronic DVT On Xarelto  Anemia with Macrocytosis Repeat CBC  GERD On Prilosec  Esophageal dysmotility On Nifedipine Per GI  Late onset Alzheimer's disease Continue Zoloft and Namenda for now Hyperlipidemia LDL goal <70 Continue on Statin   Family/ staff Communication:   Labs/tests ordered:  CMP ,CBC, LDH, Ferritin and Chest Xray  Total time spent in this patient care encounter was  45_  minutes; greater than 50% of the visit spent counseling patient and staff, reviewing records , Labs and coordinating care for problems addressed at this encounter.

## 2019-02-14 ENCOUNTER — Encounter: Payer: Self-pay | Admitting: Internal Medicine

## 2019-02-14 ENCOUNTER — Non-Acute Institutional Stay (SKILLED_NURSING_FACILITY): Payer: Medicare Other | Admitting: Internal Medicine

## 2019-02-14 DIAGNOSIS — D473 Essential (hemorrhagic) thrombocythemia: Secondary | ICD-10-CM | POA: Diagnosis not present

## 2019-02-14 DIAGNOSIS — J189 Pneumonia, unspecified organism: Secondary | ICD-10-CM

## 2019-02-14 DIAGNOSIS — U071 COVID-19: Secondary | ICD-10-CM

## 2019-02-14 DIAGNOSIS — G309 Alzheimer's disease, unspecified: Secondary | ICD-10-CM

## 2019-02-14 DIAGNOSIS — Z86718 Personal history of other venous thrombosis and embolism: Secondary | ICD-10-CM

## 2019-02-14 DIAGNOSIS — F0280 Dementia in other diseases classified elsewhere without behavioral disturbance: Secondary | ICD-10-CM

## 2019-02-14 NOTE — Progress Notes (Signed)
Location:   [place] Room Number: [place] of Service:  SNF [phone]) Provider:  Virgie Dad, MD  Virgie Dad, MD  Patient Care Team: Virgie Dad, MD as PCP - General (Internal Medicine) Annia Belt, MD as Consulting Physician (Hematology and Oncology) Suella Broad, MD as Consulting Physician (Physical Medicine and Rehabilitation) Noralee Space, MD (Pulmonary Disease) [place] [name], [name], NP as Nurse Practitioner (Nurse Practitioner) Inda Castle, MD (Inactive) as Consulting Physician (Gastroenterology)  Extended Emergency Contact Information Primary Emergency Contact: Michetti,Gene Address: [address]          [place], [place] [address] [place] Phone: [phone] Work Phone: [phone] Mobile Phone: [phone] Relation: Son Secondary Emergency Contact: [name], [place] [address] [place] Phone: [phone] Relation: Son  Code Status:  DNR Goals of care: Advanced Directive information Advanced Directives 01/10/2019  Does Patient Have a Medical Advance Directive? Yes  Type of Advance Directive Out of facility DNR (pink MOST or yellow form);Living will;Healthcare Power of Attorney  Does patient want to make changes to medical advance directive? No - Patient declined  Copy of [place] in Chart? Yes - validated most recent copy scanned in chart (See row information)  Pre-existing out of facility DNR order (yellow form or pink MOST form) Yellow form placed in chart (order not valid for inpatient use);Pink MOST form placed in chart (order not valid for inpatient use)     Chief Complaint  Patient presents with  . Acute Visit    Follow up of COVID    HPI:  Pt is a 85 y.o. female seen today for an acute visit for follow-up for her cough.  She is Long term resident of facility She has h/o Hypertension,Essential  Thrombocythemia, H/o Esophageal Stricture, Achalasia,DVT, Hyperlipidemia, Arthritis, Cognitive Impairement, COPD  She was tested for Covid due to positive staff member.  Her Covid test is positive her only symptom is coughing.  She denies any shortness of breath.  Her pulse ox is above 90.  She has no fever.  She does have decreased appetite but no nausea or vomiting or diarrhea. She does complain of weakness  Past Medical History:  Diagnosis Date  . Abnormal weight gain 05/09/2012  . Achalasia of esophagus 09/18/2013   09/18/13 Nifedipine 10mg  tid ac meals. 11/17/14 [facility] f/u as neeed   . Anxiety and depression 03/12/2012   Increased Sertraline to 50mg  daily 07/16/13 08/26/13 Mirtazapine 7.5mg  qhs  10/12/14 Pharm decreased Sertraline to 25mg .  05/04/15 TSH 2.89 09/15/15 pharm taper off Sertraline.  10/08/15 continue Sertraline 25mg  daily, POA desires. 12/02/15 wbc 6.1, Hgb 12.0, plt 271    . Asthmatic bronchitis   . BACK PAIN, LUMBAR 03/12/2007   Hx of lower back pain, had X-ray of her lumbar spine 09/08/12--spondylosis-better controlled with Hydrocodone/ApAp  10/325mg  ac and hs  01/01/13 lumbar spine inj. Able to ambulate with walker on unit until her fall 06/22/13. 06/25/13 unremarkable. X-ray lumbar spine. 11/10/13 dc Oxycodone prn-not used in 60 days.     . COLONIC POLYPS 03/12/2007   Initially noted April 2002 colonoscopy. Followup colonoscopy 05/31/2006 did not disclose any further polyps.    Marland Kitchen COPD (chronic obstructive pulmonary disease) ([place])   . Coronary artery disease   . Deep vein thrombosis of left lower extremity ([place]) 07/19/2012  . Dementia ([place]) 09/16/2012  05/04/15 TSH 2.89   . Depression   . Diverticulosis 05/10/2012  . DJD (degenerative joint disease)   . Dry skin dermatitis 11/18/2012  . Edema of leg 07/01/2010  . Epistaxis 01/24/2016   01/21/16: x2, held Xarelto x 1 day, Saline Nasal spray prn, wbc 9.5, Hgb 12.4, Plt 331, Na 138, K 4.5, Bun 25, creat 0.89 01/22/16 [id number])  .  Esophageal dysmotility 07/26/2013  . Esophageal dysphagia 05/27/2010   New dx of achalasia 6/ 2015 09/18/13 Nifedipine 10mg  tid ac meals.    . Esophageal stricture   . Essential hypertension, benign 10/30/2013   12/02/15 wbc 6.1, Hgb 12.0, plt 271   . Essential thrombocythemia ([place]) 06/09/2009  . Fall 06/25/2013  . GERD (gastroesophageal reflux disease)   . Hip fracture ([place]) 09/08/2012  . HYPERCHOLESTEROLEMIA 03/12/2007   08/10/15 cholesterol 119, triglycerides 143, HDL 51, LDL 39   . Lumbar back pain   . MACULAR DEGENERATION 01/26/2008   Qualifier: Diagnosis of  By: Lenna [place] MD, Deborra Medina   . Osteoarthritis 03/12/2007  . Osteoporosis 03/12/2007   Qualifier: Diagnosis of  By: Julien Girt CMA, Marliss Czar  06/15/14 dc Vit D 1000u po daily per pharm.    . Phlebitis and thrombophlebitis of other deep vessels of lower extremities 07/10/2010  . S/P balloon dilatation of esophageal stricture 07/28/2013   07/26/13. Dr. Henrene Pastor. EGD foreign removal and Balloon dilation of esophagus. Esophageal dysmotility, suspected achalasia, dilation at the GE junction to 8mm. Candida esophagitis.    . SYNCOPE 07/30/2009   Occurred 2011. Hospitalized. Felt due to dehydration.    . TRANSIENT ISCHEMIC ATTACKS, HX OF 03/12/2007   Qualifier: Diagnosis of  By: Julien Girt CMA, Marliss Czar     Past Surgical History:  Procedure Laterality Date  . ABDOMINAL HYSTERECTOMY     and bso  . APPENDECTOMY    . BOTOX INJECTION N/A 09/18/2013   Procedure: BOTOX INJECTION;  Surgeon: Lafayette Dragon, MD;  Location: [facility];  Service: Endoscopy;  Laterality: N/A;  . BOTOX INJECTION N/A 10/16/2014   Procedure: BOTOX INJECTION;  Surgeon: Inda Castle, MD;  Location: [facility];  Service: Endoscopy;  Laterality: N/A;  . BOTOX INJECTION N/A 05/18/2016   Procedure: BOTOX INJECTION;  Surgeon: Milus Banister, MD;  Location: [facility];  Service: Endoscopy;  Laterality: N/A;  . BOTOX INJECTION N/A 02/14/2018   Procedure: BOTOX INJECTION;  Surgeon: Milus Banister, MD;   Location: [facility];  Service: Endoscopy;  Laterality: N/A;  . CATARACT EXTRACTION W/ INTRAOCULAR LENS  IMPLANT, BILATERAL    . CHOLECYSTECTOMY    . ESOPHAGEAL MANOMETRY N/A 08/25/2013   Procedure: ESOPHAGEAL MANOMETRY (EM);  Surgeon: Jerene Bears, MD;  Location: [facility];  Service: Gastroenterology;  Laterality: N/A;  . ESOPHAGOGASTRODUODENOSCOPY N/A 07/26/2013   Procedure: ESOPHAGOGASTRODUODENOSCOPY (EGD);  Surgeon: Irene Shipper, MD;  Location: [facility] ENDOSCOPY;  Service: Endoscopy;  Laterality: N/A;  . ESOPHAGOGASTRODUODENOSCOPY N/A 10/16/2014   Procedure: ESOPHAGOGASTRODUODENOSCOPY (EGD);  Surgeon: Inda Castle, MD;  Location: [facility];  Service: Endoscopy;  Laterality: N/A;  with botox  . ESOPHAGOGASTRODUODENOSCOPY (EGD) WITH ESOPHAGEAL DILATION N/A 11/25/2012   Procedure: ESOPHAGOGASTRODUODENOSCOPY (EGD) WITH ESOPHAGEAL DILATION;  Surgeon: Jerene Bears, MD;  Location: [facility];  Service: Gastroenterology;  Laterality: N/A;  . ESOPHAGOGASTRODUODENOSCOPY (EGD) WITH PROPOFOL N/A 09/18/2013   Procedure: ESOPHAGOGASTRODUODENOSCOPY (EGD) WITH PROPOFOL;  Surgeon: Lafayette Dragon, MD;  Location: [facility];  Service: Endoscopy;  Laterality: N/A;  . ESOPHAGOGASTRODUODENOSCOPY (EGD) WITH PROPOFOL N/A 05/18/2016   Procedure: ESOPHAGOGASTRODUODENOSCOPY (EGD) WITH  PROPOFOL;  Surgeon: Milus Banister, MD;  Location: [facility];  Service: Endoscopy;  Laterality: N/A;  . ESOPHAGOGASTRODUODENOSCOPY (EGD) WITH PROPOFOL N/A 02/14/2018   Procedure: ESOPHAGOGASTRODUODENOSCOPY (EGD) WITH PROPOFOL;  Surgeon: Milus Banister, MD;  Location: [facility];  Service: Endoscopy;  Laterality: N/A;  . HIP ARTHROPLASTY Right 09/09/2012   Procedure: ARTHROPLASTY BIPOLAR HIP;  Surgeon: Mauri Pole, MD;  Location: [facility];  Service: Orthopedics;  Laterality: Right;  . TAH and BSO  08/21/1995    Allergies  Allergen Reactions  . Sulfamethoxazole Rash    Allergies as of 02/14/2019      Reactions    Sulfamethoxazole Rash      Medication List       Accurate as of February 14, 2019  1:31 PM. If you have any questions, ask your nurse or doctor.        STOP taking these medications   loperamide 2 MG capsule Commonly known as: IMODIUM Stopped by: Virgie Dad, MD   sodium chloride 0.65 % nasal spray Commonly known as: OCEAN Stopped by: Virgie Dad, MD     TAKE these medications   acetaminophen 325 MG tablet Commonly known as: TYLENOL Take 650 mg by mouth every 8 (eight) hours as needed for moderate pain.   albuterol 108 (90 Base) MCG/ACT inhaler Commonly known as: VENTOLIN HFA Inhale 2 puffs into the lungs every 6 (six) hours as needed for wheezing or shortness of breath.   ascorbic acid 500 MG tablet Commonly known as: VITAMIN C Take 500 mg by mouth daily.   atorvastatin 10 MG tablet Commonly known as: LIPITOR Take 10 mg by mouth at bedtime.   Breo Ellipta 100-25 MCG/INH Aepb Generic drug: fluticasone furoate-vilanterol Inhale 1 puff into the lungs daily. One puff inhale once a day, rinse mouth after each use.   chlorhexidine 0.12 % solution Commonly known as: PERIDEX Use as directed 5 mLs in the mouth or throat daily. SWISH AND SPIT 5ML FOR 30 SECONDS EVERYDAY INDEFINITELY   cholecalciferol 1000 units tablet Commonly known as: VITAMIN D Take 1,000 Units by mouth daily.   Denta 5000 Plus 1.1 % Crea dental cream Generic drug: sodium fluoride Place 1 application onto teeth at bedtime. Apply a thin ribbon   Humidifier Misc Please use humidifier to prevent excessive dryness.   HYDROcodone-acetaminophen 10-325 MG tablet Commonly known as: NORCO Take 1 tablet by mouth. Once A Day - PRN   HYDROcodone-acetaminophen 10-325 MG tablet Commonly known as: NORCO Take 1 tablet by mouth SCHEDULED twice daily; may take an additional tablet once daily as needed for increased back pain.HOLD for sedation.   hydroxyurea 500 MG capsule Commonly known as: HYDREA Take  500 mg by mouth. Take 2 capsules=1000 mg on Monday. (Wear gloves when handling medication) May take with food to minimize GI side effects.   hydroxyurea 500 MG capsule Commonly known as: HYDREA Take 500 mg by mouth. TAKE ONE CAP BY MOUTH EVERY SUNDAY, [facility] AND Friday.May take with food to minimize GI side effects.   memantine 10 MG tablet Commonly known as: NAMENDA Take 10 mg by mouth 2 (two) times daily.   metoprolol tartrate 25 MG tablet Commonly known as: LOPRESSOR Take 25 mg by mouth 2 (two) times daily.   montelukast 10 MG tablet Commonly known as: SINGULAIR Take 10 mg by mouth at bedtime.   NIFEdipine 10 MG capsule Commonly known as: PROCARDIA Take 10 mg by mouth 3 (three) times daily before meals. Squeeze contents of  one capsule under tongue 15-30 minutes before meals 3 times a day   nitroGLYCERIN 0.4 MG SL tablet Commonly known as: NITROSTAT Place 0.4 mg under the tongue every 5 (five) minutes as needed for chest pain.   omeprazole 20 MG capsule Commonly known as: PRILOSEC Take 20 mg by mouth at bedtime.   PreserVision AREDS 2 Caps Take 1 capsule by mouth 2 (two) times daily.   Rivaroxaban 15 MG Tabs tablet Commonly known as: XARELTO Take 15 mg by mouth daily.   sertraline 25 MG tablet Commonly known as: ZOLOFT Take 25 mg by mouth daily.   zinc sulfate 220 (50 Zn) MG capsule Take 220 mg by mouth daily.       Review of Systems  Review of Systems  Constitutional: Negative for activity change, appetite change, chills, diaphoresis, fatigue and fever.  HENT: Negative for mouth sores, postnasal drip, rhinorrhea, sinus pain and sore throat.   Respiratory: Negative for apnea,  chest tightness, shortness of breath and wheezing.   Cardiovascular: Negative for chest pain, palpitations and leg swelling.  Gastrointestinal: Negative for abdominal distention, abdominal pain, constipation, diarrhea, nausea and vomiting.  Genitourinary: Negative for dysuria and  frequency.  Musculoskeletal: Negative for arthralgias, joint swelling and myalgias.  Skin: Negative for rash.  Neurological: Negative for dizziness, syncope, weakness, light-headedness and numbness.  Psychiatric/Behavioral: Negative for behavioral problems, confusion and sleep disturbance.     Immunization History  Administered Date(s) Administered  . Influenza Split 11/16/2010, 12/13/2011  . Influenza Whole 11/27/2009, 11/29/2017  . Influenza, High Dose Seasonal PF 11/29/2018  . Influenza-Unspecified 12/31/2013, 11/18/2014, 12/14/2015, 12/11/2016  . PPD Test 09/11/2012  . Pneumococcal Conjugate-13 06/01/2017  . Pneumococcal Polysaccharide-23 02/28/1988  . Tdap 05/31/2017   Pertinent  Health Maintenance Due  Topic Date Due  . INFLUENZA VACCINE  Completed  . DEXA SCAN  Completed  . PNA vac Low Risk Adult  Completed   Fall Risk  02/08/2017 02/08/2017 10/04/2015 12/28/2014 10/23/2014  Falls in the past year? No Exclusion - non ambulatory No Yes Yes  Number falls in past yr: - - - 1 1  Injury with Fall? - - - No No  Risk for fall due to : - Impaired balance/gait;Impaired vision Impaired mobility;Impaired balance/gait History of fall(s);Impaired balance/gait;Impaired mobility Impaired mobility  Follow up - - - Falls prevention discussed Falls evaluation completed   Functional Status Survey:    Vitals:   02/14/19 1316  BP: (!) 122/54  Pulse: 62  Resp: 20  Temp: 98.5 F (36.9 C)  SpO2: 95%  Weight: 104 lb 9.6 oz (47.4 kg)  Height: 5\' 1"  (1.549 m)   Body mass index is 19.76 kg/m. Physical Exam  Constitutional:  Well-developed and well-nourished.  HENT:  Head: Normocephalic.  Mouth/Throat: Oropharynx is clear and moist.  Eyes: Pupils are equal, round, and reactive to light.  Neck: Neck supple.  Cardiovascular: Normal rate and normal heart sounds.  No murmur heard. Pulmonary/Chest: Effort normal and breath sounds normal. No respiratory distress. No wheezes. She hs some  rales in Right Lower Lung Abdominal: Soft. Bowel sounds are normal. No distension. There is no tenderness. There is no rebound.  Musculoskeletal: No edema.  Lymphadenopathy: none Neurological: No Focal Deficits  Skin: Skin is warm and dry.  Psychiatric: Normal mood and affect. Behavior is normal. Thought content normal.    Labs reviewed: Recent Labs    10/29/18 0000 11/28/18 0000 11/29/18 0000 01/28/19 0000  NA 141 140 141 142  K 4.3 5.7* 4.4 4.3  CL 107 106 107 106  CO2 29 26 25  27*  BUN 27* 19 22* 22*  CREATININE 0.8 0.6 0.9 0.7  CALCIUM 8.6 8.7 9.3  --    Recent Labs    04/30/18 0000 07/30/18 0000 10/29/18 0000 01/28/19 0000  AST 16 14 14 15   ALT 9 7 8 9   ALKPHOS 70  --  55 62  PROT 5.8  --  5.4*  --   ALBUMIN 3.4  --  3.2  --    Recent Labs    11/28/18 0000 11/29/18 0000 01/28/19 0000  WBC 5.1 7.1 5.6  NEUTROABS 2,820 4,743 3,231  HGB 11.8* 12.4 11.5*  HCT 35* 37 34*  PLT 290 293 289   Lab Results  Component Value Date   TSH 2.00 09/21/2016   Lab Results  Component Value Date   HGBA1C 5.5 10/16/2014   Lab Results  Component Value Date   CHOL 143 09/18/2017   HDL 50 09/18/2017   LDLCALC 70 09/18/2017   LDLDIRECT 152.3 06/04/2009   TRIG 147 09/18/2017   CHOLHDL 3 09/13/2011    Significant Diagnostic Results in last 30 days:  No results found.  Assessment/Plan  CoVid Positive with Cough Chest Xray Positive for Right Lower Lobe Infiltrate Started on Azithromycin Talked to son who agreed that we should try to treat her here in [place] and Not transfer to Hospital. POX more then 90 % right now Will continue to monitor Patient on Xarelto  Essential Hypertension On Lopressor Essential Thrombocythemia Continue Hydrea Repeat CBC was stable with no Leucocytosis Chronic DVT Continue Xarelto Anemia with Macrocytosis Stable  GERD On Prilosec  Esophageal dysmotility On Nifedipine   Late onset Alzheimer's disease Will continue  Namenda and Zoloft  Hyperlipidemia LDL goal <70 Continue on Statin    Family/ staff Communication:  Labs/tests ordered:  BMP  Total time spent in this patient care encounter was  45_  minutes; greater than 50% of the visit spent counseling patient and staff, reviewing records , Labs and coordinating care for problems addressed at this encounter.

## 2019-02-17 ENCOUNTER — Encounter: Payer: Self-pay | Admitting: Nurse Practitioner

## 2019-02-17 DIAGNOSIS — J189 Pneumonia, unspecified organism: Secondary | ICD-10-CM | POA: Insufficient documentation

## 2019-02-17 LAB — BASIC METABOLIC PANEL
BUN: 18 (ref 4–21)
CO2: 29 — AB (ref 13–22)
Chloride: 107 (ref 99–108)
Creatinine: 0.6 (ref 0.5–1.1)
Glucose: 107
Potassium: 4 (ref 3.4–5.3)
Sodium: 142 (ref 137–147)

## 2019-02-17 LAB — COMPREHENSIVE METABOLIC PANEL: Calcium: 8.8 (ref 8.7–10.7)

## 2019-02-18 LAB — CBC AND DIFFERENTIAL
HCT: 37 (ref 36–46)
Hemoglobin: 12.3 (ref 12.0–16.0)
Neutrophils Absolute: 2328
WBC: 4.4

## 2019-02-18 LAB — BASIC METABOLIC PANEL
BUN: 19 (ref 4–21)
CO2: 26 — AB (ref 13–22)
Chloride: 107 (ref 99–108)
Creatinine: 0.7 (ref 0.5–1.1)
Glucose: 91
Potassium: 4.1 (ref 3.4–5.3)
Sodium: 140 (ref 137–147)

## 2019-02-18 LAB — IRON,TIBC AND FERRITIN PANEL: Ferritin: 74

## 2019-02-18 LAB — HEPATIC FUNCTION PANEL
ALT: 13 (ref 7–35)
AST: 18 (ref 13–35)
Alkaline Phosphatase: 71 (ref 25–125)
Bilirubin, Total: 0.5

## 2019-02-18 LAB — CBC: RBC: 3.53 — AB (ref 3.87–5.11)

## 2019-02-20 ENCOUNTER — Encounter: Payer: Self-pay | Admitting: Internal Medicine

## 2019-02-20 ENCOUNTER — Non-Acute Institutional Stay (SKILLED_NURSING_FACILITY): Payer: Medicare Other | Admitting: Internal Medicine

## 2019-02-20 DIAGNOSIS — U071 COVID-19: Secondary | ICD-10-CM

## 2019-02-20 DIAGNOSIS — R630 Anorexia: Secondary | ICD-10-CM

## 2019-02-20 DIAGNOSIS — D473 Essential (hemorrhagic) thrombocythemia: Secondary | ICD-10-CM

## 2019-02-20 DIAGNOSIS — J189 Pneumonia, unspecified organism: Secondary | ICD-10-CM

## 2019-02-20 DIAGNOSIS — I825Y9 Chronic embolism and thrombosis of unspecified deep veins of unspecified proximal lower extremity: Secondary | ICD-10-CM

## 2019-02-20 LAB — CBC AND DIFFERENTIAL
HCT: 39 (ref 36–46)
Hemoglobin: 13.2 (ref 12.0–16.0)
Neutrophils Absolute: 4954
Platelets: 296 (ref 150–399)
WBC: 6.9

## 2019-02-20 LAB — CBC: RBC: 3.68 — AB (ref 3.87–5.11)

## 2019-02-20 LAB — COMPREHENSIVE METABOLIC PANEL
Albumin: 3.3 — AB (ref 3.5–5.0)
Calcium: 8.8 (ref 8.7–10.7)
Globulin: 2.9

## 2019-02-20 LAB — BASIC METABOLIC PANEL
BUN: 25 — AB (ref 4–21)
CO2: 22 (ref 13–22)
Chloride: 111 — AB (ref 99–108)
Creatinine: 0.7 (ref 0.5–1.1)
Glucose: 108
Potassium: 3.9 (ref 3.4–5.3)
Sodium: 145 (ref 137–147)

## 2019-02-20 LAB — IRON,TIBC AND FERRITIN PANEL: Ferritin: 173

## 2019-02-20 NOTE — Progress Notes (Signed)
Location:    [facility] Room Number: [facility] of Service:  SNF [phone]) Provider:  Virgie Dad, MD  Virgie Dad, MD  Patient Care Team: Virgie Dad, MD as PCP - General (Internal Medicine) Annia Belt, MD as Consulting Physician (Hematology and Oncology) Suella Broad, MD as Consulting Physician (Physical Medicine and Rehabilitation) Noralee Space, MD (Pulmonary Disease) [name], [name], NP as Nurse Practitioner (Nurse Practitioner) Inda Castle, MD (Inactive) as Consulting Physician (Gastroenterology)  Extended Emergency Contact Information Primary Emergency Contact: Bacallao,Gene Address: [address]          [place], [place] [address] [place] Phone: [phone] Work Phone: [phone] Mobile Phone: [phone] Relation: Son Secondary Emergency Contact: [name], [place] [address] [place] Phone: [phone] Relation: Son  Code Status:  DNR Goals of care: Advanced Directive information Advanced Directives 01/10/2019  Does Patient Have a Medical Advance Directive? Yes  Type of Advance Directive Out of facility DNR (pink MOST or yellow form);Living will;Healthcare Power of Attorney  Does patient want to make changes to medical advance directive? No - Patient declined  Copy of [place] in Chart? Yes - validated most recent copy scanned in chart (See row information)  Pre-existing out of facility DNR order (yellow form or pink MOST form) Yellow form placed in chart (order not valid for inpatient use);Pink MOST form placed in chart (order not valid for inpatient use)     Chief Complaint  Patient presents with  . Acute Visit    Covid positive    HPI:  Pt is a 85 y.o. female seen today for an acute visit for follow-up for COVID pneumonia  She is Long term resident of facility She has h/o Hypertension,Essential  Thrombocythemia, H/o Esophageal Stricture, Achalasia,DVT, Hyperlipidemia, Arthritis, Cognitive Impairement, COPD  She was tested for Covid due to positive staff member.  Since then, she developed cough and her chest Xray is positive for right lower lobe infiltrate. She was started on Zithromax.  She continues to have cough, no hypoxia, no fever. Her only complaint continues to be that she is not eating Nurses said she's only eating 5% of her food. She looked more drowsy and more confused today.    Past Medical History:  Diagnosis Date  . Abnormal weight gain 05/09/2012  . Achalasia of esophagus 09/18/2013   09/18/13 Nifedipine 10mg  tid ac meals. 11/17/14 [facility] f/u as neeed   . Anxiety and depression 03/12/2012   Increased Sertraline to 50mg  daily 07/16/13 08/26/13 Mirtazapine 7.5mg  qhs  10/12/14 Pharm decreased Sertraline to 25mg .  05/04/15 TSH 2.89 09/15/15 pharm taper off Sertraline.  10/08/15 continue Sertraline 25mg  daily, POA desires. 12/02/15 wbc 6.1, Hgb 12.0, plt 271    . Asthmatic bronchitis   . BACK PAIN, LUMBAR 03/12/2007   Hx of lower back pain, had X-ray of her lumbar spine 09/08/12--spondylosis-better controlled with Hydrocodone/ApAp  10/325mg  ac and hs  01/01/13 lumbar spine inj. Able to ambulate with walker on unit until her fall 06/22/13. 06/25/13 unremarkable. X-ray lumbar spine. 11/10/13 dc Oxycodone prn-not used in 60 days.     . COLONIC POLYPS 03/12/2007   Initially noted April 2002 colonoscopy. Followup colonoscopy 05/31/2006 did not disclose any further polyps.    Marland Kitchen COPD (chronic obstructive pulmonary disease) ([place])   . Coronary artery disease   . Deep vein  thrombosis of left lower extremity ([place]) 07/19/2012  . Dementia ([facility]) 09/16/2012   05/04/15 TSH 2.89   . Depression   . Diverticulosis 05/10/2012  . DJD (degenerative joint disease)   . Dry skin dermatitis 11/18/2012  . Edema of leg 07/01/2010  . Epistaxis 01/24/2016   01/21/16: x2, held Xarelto x 1 day, Saline Nasal spray prn,  wbc 9.5, Hgb 12.4, Plt 331, Na 138, K 4.5, Bun 25, creat 0.89 01/22/16 [id number])  . Esophageal dysmotility 07/26/2013  . Esophageal dysphagia 05/27/2010   New dx of achalasia 6/ 2015 09/18/13 Nifedipine 10mg  tid ac meals.    . Esophageal stricture   . Essential hypertension, benign 10/30/2013   12/02/15 wbc 6.1, Hgb 12.0, plt 271   . Essential thrombocythemia ([place]) 06/09/2009  . Fall 06/25/2013  . GERD (gastroesophageal reflux disease)   . Hip fracture ([facility] [place]) 09/08/2012  . HYPERCHOLESTEROLEMIA 03/12/2007   08/10/15 cholesterol 119, triglycerides 143, HDL 51, LDL 39   . Lumbar back pain   . MACULAR DEGENERATION 01/26/2008   Qualifier: Diagnosis of  By: Lenna Gilford MD, Deborra Medina   . Osteoarthritis 03/12/2007  . Osteoporosis 03/12/2007   Qualifier: Diagnosis of  By: Julien Girt CMA, Marliss Czar  06/15/14 dc Vit D 1000u po daily per pharm.    . Phlebitis and thrombophlebitis of other deep vessels of lower extremities 07/10/2010  . S/P balloon dilatation of esophageal stricture 07/28/2013   07/26/13. Dr. Henrene Pastor. EGD foreign removal and Balloon dilation of esophagus. Esophageal dysmotility, suspected achalasia, dilation at the GE junction to 38mm. Candida esophagitis.    . SYNCOPE 07/30/2009   Occurred 2011. Hospitalized. Felt due to dehydration.    . TRANSIENT ISCHEMIC ATTACKS, HX OF 03/12/2007   Qualifier: Diagnosis of  By: Julien Girt CMA, Marliss Czar     Past Surgical History:  Procedure Laterality Date  . ABDOMINAL HYSTERECTOMY     and bso  . APPENDECTOMY    . BOTOX INJECTION N/A 09/18/2013   Procedure: BOTOX INJECTION;  Surgeon: Lafayette Dragon, MD;  Location: [facility];  Service: Endoscopy;  Laterality: N/A;  . BOTOX INJECTION N/A 10/16/2014   Procedure: BOTOX INJECTION;  Surgeon: Inda Castle, MD;  Location: [facility];  Service: Endoscopy;  Laterality: N/A;  . BOTOX INJECTION N/A 05/18/2016   Procedure: BOTOX INJECTION;  Surgeon: Milus Banister, MD;  Location: [facility];  Service: Endoscopy;  Laterality: N/A;  . BOTOX  INJECTION N/A 02/14/2018   Procedure: BOTOX INJECTION;  Surgeon: Milus Banister, MD;  Location: [facility];  Service: Endoscopy;  Laterality: N/A;  . CATARACT EXTRACTION W/ INTRAOCULAR LENS  IMPLANT, BILATERAL    . CHOLECYSTECTOMY    . ESOPHAGEAL MANOMETRY N/A 08/25/2013   Procedure: ESOPHAGEAL MANOMETRY (EM);  Surgeon: Jerene Bears, MD;  Location: [facility];  Service: Gastroenterology;  Laterality: N/A;  . ESOPHAGOGASTRODUODENOSCOPY N/A 07/26/2013   Procedure: ESOPHAGOGASTRODUODENOSCOPY (EGD);  Surgeon: Irene Shipper, MD;  Location: [facility] ENDOSCOPY;  Service: Endoscopy;  Laterality: N/A;  . ESOPHAGOGASTRODUODENOSCOPY N/A 10/16/2014   Procedure: ESOPHAGOGASTRODUODENOSCOPY (EGD);  Surgeon: Inda Castle, MD;  Location: [facility];  Service: Endoscopy;  Laterality: N/A;  with botox  . ESOPHAGOGASTRODUODENOSCOPY (EGD) WITH ESOPHAGEAL DILATION N/A 11/25/2012   Procedure: ESOPHAGOGASTRODUODENOSCOPY (EGD) WITH ESOPHAGEAL DILATION;  Surgeon: Jerene Bears, MD;  [facility];  Service: Gastroenterology;  Laterality: N/A;  . ESOPHAGOGASTRODUODENOSCOPY (EGD) WITH PROPOFOL N/A 09/18/2013   Procedure: ESOPHAGOGASTRODUODENOSCOPY (EGD) WITH PROPOFOL;  Surgeon: Lafayette Dragon, MD;  [facility];  Service: Endoscopy;  Laterality: N/A;  .  ESOPHAGOGASTRODUODENOSCOPY (EGD) WITH PROPOFOL N/A 05/18/2016   Procedure: ESOPHAGOGASTRODUODENOSCOPY (EGD) WITH PROPOFOL;  Surgeon: Milus Banister, MD;  [facility];  Service: Endoscopy;  Laterality: N/A;  . ESOPHAGOGASTRODUODENOSCOPY (EGD) WITH PROPOFOL N/A 02/14/2018   Procedure: ESOPHAGOGASTRODUODENOSCOPY (EGD) WITH PROPOFOL;  Surgeon: Milus Banister, MD;  [facility];  Service: Endoscopy;  Laterality: N/A;  . HIP ARTHROPLASTY Right 09/09/2012   Procedure: ARTHROPLASTY BIPOLAR HIP;  Surgeon: Mauri Pole, MD;  Location: [facility];  Service: Orthopedics;  Laterality: Right;  . TAH and BSO  08/21/1995    Allergies  Allergen  Reactions  . Sulfamethoxazole Rash    Allergies as of 02/20/2019      Reactions   Sulfamethoxazole Rash      Medication List       Accurate as of February 20, 2019 10:59 AM. If you have any questions, ask your nurse or doctor.        acetaminophen 325 MG tablet Commonly known as: TYLENOL Take 650 mg by mouth every 8 (eight) hours as needed for moderate pain.   albuterol 108 (90 Base) MCG/ACT inhaler Commonly known as: VENTOLIN HFA Inhale 2 puffs into the lungs every 6 (six) hours as needed for wheezing or shortness of breath.   ascorbic acid 500 MG tablet Commonly known as: VITAMIN C Take 500 mg by mouth daily.   atorvastatin 10 MG tablet Commonly known as: LIPITOR Take 10 mg by mouth at bedtime.   Breo Ellipta 100-25 MCG/INH Aepb Generic drug: fluticasone furoate-vilanterol Inhale 1 puff into the lungs daily. One puff inhale once a day, rinse mouth after each use.   chlorhexidine 0.12 % solution Commonly known as: PERIDEX Use as directed 5 mLs in the mouth or throat daily. SWISH AND SPIT 5ML FOR 30 SECONDS EVERYDAY INDEFINITELY   cholecalciferol 1000 units tablet Commonly known as: VITAMIN D Take 1,000 Units by mouth daily.   Denta 5000 Plus 1.1 % Crea dental cream Generic drug: sodium fluoride Place 1 application onto teeth at bedtime. Apply a thin ribbon   Humidifier Misc Please use humidifier to prevent excessive dryness.   HYDROcodone-acetaminophen 10-325 MG tablet Commonly known as: NORCO Take 1 tablet by mouth. Once A Day - PRN   HYDROcodone-acetaminophen 10-325 MG tablet Commonly known as: NORCO Take 1 tablet by mouth SCHEDULED twice daily; may take an additional tablet once daily as needed for increased back pain.HOLD for sedation.   hydroxyurea 500 MG capsule Commonly known as: HYDREA Take 500 mg by mouth. Take 2 capsules=1000 mg on Monday. (Wear gloves when handling medication) May take with food to minimize GI side effects.   hydroxyurea 500  MG capsule Commonly known as: HYDREA Take 500 mg by mouth. TAKE ONE CAP BY MOUTH EVERY SUNDAY, [facility] AND Friday.May take with food to minimize GI side effects.   memantine 10 MG tablet Commonly known as: NAMENDA Take 10 mg by mouth 2 (two) times daily.   metoprolol tartrate 25 MG tablet Commonly known as: LOPRESSOR Take 25 mg by mouth 2 (two) times daily.   montelukast 10 MG tablet Commonly known as: SINGULAIR Take 10 mg by mouth at bedtime.   NIFEdipine 10 MG capsule Commonly known as: PROCARDIA Take 10 mg by mouth 3 (three) times daily before meals. Squeeze contents of one capsule under tongue 15-30 minutes before meals 3 times a day   nitroGLYCERIN 0.4 MG SL tablet Commonly known as: NITROSTAT Place 0.4 mg under the tongue every 5 (five) minutes as needed for  chest pain.   omeprazole 20 MG capsule Commonly known as: PRILOSEC Take 20 mg by mouth at bedtime.   PreserVision AREDS 2 Caps Take 1 capsule by mouth 2 (two) times daily.   Rivaroxaban 15 MG Tabs tablet Commonly known as: XARELTO Take 15 mg by mouth daily.   sertraline 25 MG tablet Commonly known as: ZOLOFT Take 25 mg by mouth daily.   zinc sulfate 220 (50 Zn) MG capsule Take 220 mg by mouth daily.       Review of Systems  Constitutional: Positive for activity change and appetite change.  HENT: Negative.   Respiratory: Positive for cough.   Cardiovascular: Negative.   Gastrointestinal: Negative.   Genitourinary: Negative.   Musculoskeletal: Negative.   Neurological: Positive for weakness.  Psychiatric/Behavioral: Positive for confusion.  All other systems reviewed and are negative.   Immunization History  Administered Date(s) Administered  . Influenza Split 11/16/2010, 12/13/2011  . Influenza Whole 11/27/2009, 11/29/2017  . Influenza, High Dose Seasonal PF 11/29/2018  . Influenza-Unspecified 12/31/2013, 11/18/2014, 12/14/2015, 12/11/2016  . PPD Test 09/11/2012  . Pneumococcal Conjugate-13  06/01/2017  . Pneumococcal Polysaccharide-23 02/28/1988  . Tdap 05/31/2017   Pertinent  Health Maintenance Due  Topic Date Due  . INFLUENZA VACCINE  Completed  . DEXA SCAN  Completed  . PNA vac Low Risk Adult  Completed   Fall Risk  02/08/2017 02/08/2017 10/04/2015 12/28/2014 10/23/2014  Falls in the past year? No Exclusion - non ambulatory No Yes Yes  Number falls in past yr: - - - 1 1  Injury with Fall? - - - No No  Risk for fall due to : - Impaired balance/gait;Impaired vision Impaired mobility;Impaired balance/gait History of fall(s);Impaired balance/gait;Impaired mobility Impaired mobility  Follow up - - - Falls prevention discussed Falls evaluation completed   Functional Status Survey:    Vitals:   02/20/19 1047  BP: (!) 118/50  Pulse: 80  Resp: 18  Temp: (!) 97.5 F (36.4 C)  SpO2: 94%  Weight: 104 lb 9.6 oz (47.4 kg)  Height: 5\' 1"  (1.549 m)   Body mass index is 19.76 kg/m. Physical Exam Vitals reviewed.  HENT:     Head: Normocephalic.     Nose: Nose normal.     Mouth/Throat:     Mouth: Mucous membranes are dry.  Eyes:     Pupils: Pupils are equal, round, and reactive to light.  Cardiovascular:     Rate and Rhythm: Normal rate and regular rhythm.     Pulses: Normal pulses.  Pulmonary:     Effort: Pulmonary effort is normal.     Breath sounds: Rales present.  Abdominal:     General: Abdomen is flat. Bowel sounds are normal.     Palpations: Abdomen is soft.  Musculoskeletal:        General: No swelling.     Cervical back: Neck supple.  Skin:    General: Skin is warm.  Neurological:     General: No focal deficit present.     Mental Status: She is alert.     Comments: Looked more confused and drowsy.  Psychiatric:        Mood and Affect: Mood normal.        Thought Content: Thought content normal.        Judgment: Judgment normal.     Labs reviewed: Recent Labs    11/28/18 0000 11/29/18 0000 01/28/19 0000 02/17/19 0000 02/18/19 0000  NA 140  141 142 142 140  K 5.7*  4.4 4.3 4.0 4.1  CL 106 107 106 107 107  CO2 26 25 27* 29* 26*  BUN 19 22* 22* 18 19  CREATININE 0.6 0.9 0.7 0.6 0.7  CALCIUM 8.7 9.3  --  8.8  --    Recent Labs    04/30/18 0000 10/29/18 0000 01/28/19 0000 02/18/19 0000  AST 16 14 15 18   ALT 9 8 9 13   ALKPHOS 70 55 62 71  PROT 5.8 5.4*  --   --   ALBUMIN 3.4 3.2  --   --    Recent Labs    11/28/18 0000 11/29/18 0000 01/28/19 0000 02/18/19 0000  WBC 5.1 7.1 5.6 4.4  NEUTROABS 2,820 4,743 3,231 2,328  HGB 11.8* 12.4 11.5* 12.3  HCT 35* 37 34* 37  PLT 290 293 289  --    Lab Results  Component Value Date   TSH 2.00 09/21/2016   Lab Results  Component Value Date   HGBA1C 5.5 10/16/2014   Lab Results  Component Value Date   CHOL 143 09/18/2017   HDL 50 09/18/2017   LDLCALC 70 09/18/2017   LDLDIRECT 152.3 06/04/2009   TRIG 147 09/18/2017   CHOLHDL 3 09/13/2011    Significant Diagnostic Results in last 30 days:  No results found.  Assessment/Plan Pneumonia of right lower lobe due to Covid On Zithromax Pox more than 90% on RA Not wheezing  Decreased appetite The stat BMP showed BUN and Creat in normal limits Sodium was 145 Will start her on normal saline 75 CC/hr for 2 L  Repeat BMP on Monday I will discontinue her namenda and statin for now   Essential Hypertension On Lopressor Essential Thrombocythemia Continue Hydrea Repeat CBC was stable with no Leucocytosis Chronic DVT Continue Xarelto Anemia with Macrocytosis Stable  GERD On Prilosec  Esophageal dysmotility On Nifedipine      Family/ staff Communication:   Labs/tests ordered:  Total time spent in this patient care encounter was  25_  minutes; greater than 50% of the visit spent counseling patient and staff, reviewing records , Labs and coordinating care for problems addressed at this encounter.

## 2019-02-24 ENCOUNTER — Encounter: Payer: Self-pay | Admitting: Internal Medicine

## 2019-02-24 ENCOUNTER — Non-Acute Institutional Stay (SKILLED_NURSING_FACILITY): Payer: Medicare Other | Admitting: Internal Medicine

## 2019-02-24 ENCOUNTER — Other Ambulatory Visit: Payer: Self-pay | Admitting: Internal Medicine

## 2019-02-24 DIAGNOSIS — J1282 Pneumonia due to coronavirus disease 2019: Secondary | ICD-10-CM

## 2019-02-24 DIAGNOSIS — U071 COVID-19: Secondary | ICD-10-CM

## 2019-02-24 DIAGNOSIS — D473 Essential (hemorrhagic) thrombocythemia: Secondary | ICD-10-CM | POA: Diagnosis not present

## 2019-02-24 DIAGNOSIS — R630 Anorexia: Secondary | ICD-10-CM | POA: Diagnosis not present

## 2019-02-24 DIAGNOSIS — G309 Alzheimer's disease, unspecified: Secondary | ICD-10-CM

## 2019-02-24 DIAGNOSIS — F0280 Dementia in other diseases classified elsewhere without behavioral disturbance: Secondary | ICD-10-CM

## 2019-02-24 DIAGNOSIS — J1289 Other viral pneumonia: Secondary | ICD-10-CM

## 2019-02-24 NOTE — Progress Notes (Signed)
Location: [place] Room Number: [place] of Service:  SNF [phone])  Provider: Veleta Miners MD  Code Status: DNR Goals of Care:  Advanced Directives 01/10/2019  Does Patient Have a Medical Advance Directive? Yes  Type of Advance Directive Out of facility DNR (pink MOST or yellow form);Living will;Healthcare Power of Attorney  Does patient want to make changes to medical advance directive? No - Patient declined  Copy of [place] in Chart? Yes - validated most recent copy scanned in chart (See row information)  Pre-existing out of facility DNR order (yellow form or pink MOST form) Yellow form placed in chart (order not valid for inpatient use);Pink MOST form placed in chart (order not valid for inpatient use)     Chief Complaint  Patient presents with  . Acute Visit    HPI: Patient is a 85 y.o. female seen today for an acute visit for Follow up of her Covid Pneumonia She is Long term resident of facility She has h/o Hypertension,Essential Thrombocythemia, H/o Esophageal Stricture, Achalasia,DVT, Hyperlipidemia, Arthritis, Cognitive Impairement, COPD  She was tested for Covid due to positive staff member.  Since then, she developed cough and her chest Xray is positive for right lower lobe infiltrate. She was started on Zithromax.  She continues to have cough, no hypoxia, no fever. AS she was not eating she was given 2l of NS IV fluids She is little better today. More responsive. Appetite stays Poor. Cough Present. No Hypoxia  Past Medical History:  Diagnosis Date  . Abnormal weight gain 05/09/2012  . Achalasia of esophagus 09/18/2013   09/18/13 Nifedipine 10mg  tid ac meals. 11/17/14 [facility] f/u as neeed   . Anxiety and depression 03/12/2012   Increased Sertraline to 50mg  daily 07/16/13 08/26/13 Mirtazapine 7.5mg  qhs  10/12/14 Pharm decreased Sertraline to 25mg .  05/04/15 TSH 2.89 09/15/15 pharm taper off Sertraline.  10/08/15 continue  Sertraline 25mg  daily, POA desires. 12/02/15 wbc 6.1, Hgb 12.0, plt 271    . Asthmatic bronchitis   . BACK PAIN, LUMBAR 03/12/2007   Hx of lower back pain, had X-ray of her lumbar spine 09/08/12--spondylosis-better controlled with Hydrocodone/ApAp  10/325mg  ac and hs  01/01/13 lumbar spine inj. Able to ambulate with walker on unit until her fall 06/22/13. 06/25/13 unremarkable. X-ray lumbar spine. 11/10/13 dc Oxycodone prn-not used in 60 days.     . COLONIC POLYPS 03/12/2007   Initially noted April 2002 colonoscopy. Followup colonoscopy 05/31/2006 did not disclose any further polyps.    Marland Kitchen COPD (chronic obstructive pulmonary disease) ([place])   . Coronary artery disease   . Deep vein thrombosis of left lower extremity ([place]) 07/19/2012  . Dementia ([place]) 09/16/2012   05/04/15 TSH 2.89   . Depression   . Diverticulosis 05/10/2012  . DJD (degenerative joint disease)   . Dry skin dermatitis 11/18/2012  . Edema of leg 07/01/2010  . Epistaxis 01/24/2016   01/21/16: x2, held Xarelto x 1 day, Saline Nasal spray prn, wbc 9.5, Hgb 12.4, Plt 331, Na 138, K 4.5, Bun 25, creat 0.89 01/22/16 [id number])  . Esophageal dysmotility 07/26/2013  . Esophageal dysphagia 05/27/2010   New dx of achalasia 6/ 2015 09/18/13 Nifedipine 10mg  tid ac meals.    . Esophageal stricture   . Essential hypertension, benign 10/30/2013   12/02/15 wbc 6.1, Hgb 12.0, plt 271   . Essential thrombocythemia ([place]) 06/09/2009  . Fall 06/25/2013  . GERD (gastroesophageal reflux disease)   . Hip fracture ([place]) 09/08/2012  .  HYPERCHOLESTEROLEMIA 03/12/2007   08/10/15 cholesterol 119, triglycerides 143, HDL 51, LDL 39   . Lumbar back pain   . MACULAR DEGENERATION 01/26/2008   Qualifier: Diagnosis of  By: Lenna Gilford MD, Deborra Medina   . Osteoarthritis 03/12/2007  . Osteoporosis 03/12/2007   Qualifier: Diagnosis of  By: Julien Girt CMA, Marliss Czar  06/15/14 dc Vit D 1000u po daily per pharm.    . Phlebitis and thrombophlebitis of other deep vessels of lower extremities 07/10/2010  .  S/P balloon dilatation of esophageal stricture 07/28/2013   07/26/13. Dr. Henrene Pastor. EGD foreign removal and Balloon dilation of esophagus. Esophageal dysmotility, suspected achalasia, dilation at the GE junction to 66mm. Candida esophagitis.    . SYNCOPE 07/30/2009   Occurred 2011. Hospitalized. Felt due to dehydration.    . TRANSIENT ISCHEMIC ATTACKS, HX OF 03/12/2007   Qualifier: Diagnosis of  By: Julien Girt CMA, Marliss Czar      Past Surgical History:  Procedure Laterality Date  . ABDOMINAL HYSTERECTOMY     and bso  . APPENDECTOMY    . BOTOX INJECTION N/A 09/18/2013   Procedure: BOTOX INJECTION;  Surgeon: Lafayette Dragon, MD;  Location: [facility];  Service: Endoscopy;  Laterality: N/A;  . BOTOX INJECTION N/A 10/16/2014   Procedure: BOTOX INJECTION;  Surgeon: Inda Castle, MD;  Location: [facility];  Service: Endoscopy;  Laterality: N/A;  . BOTOX INJECTION N/A 05/18/2016   Procedure: BOTOX INJECTION;  Surgeon: Milus Banister, MD;  Location: [facility];  Service: Endoscopy;  Laterality: N/A;  . BOTOX INJECTION N/A 02/14/2018   Procedure: BOTOX INJECTION;  Surgeon: Milus Banister, MD;  Location: [facility];  Service: Endoscopy;  Laterality: N/A;  . CATARACT EXTRACTION W/ INTRAOCULAR LENS  IMPLANT, BILATERAL    . CHOLECYSTECTOMY    . ESOPHAGEAL MANOMETRY N/A 08/25/2013   Procedure: ESOPHAGEAL MANOMETRY (EM);  Surgeon: Jerene Bears, MD;  Location: [facility];  Service: Gastroenterology;  Laterality: N/A;  . ESOPHAGOGASTRODUODENOSCOPY N/A 07/26/2013   Procedure: ESOPHAGOGASTRODUODENOSCOPY (EGD);  Surgeon: Irene Shipper, MD;  Location: [facility] ENDOSCOPY;  Service: Endoscopy;  Laterality: N/A;  . ESOPHAGOGASTRODUODENOSCOPY N/A 10/16/2014   Procedure: ESOPHAGOGASTRODUODENOSCOPY (EGD);  Surgeon: Inda Castle, MD;  Location: [facility];  Service: Endoscopy;  Laterality: N/A;  with botox  . ESOPHAGOGASTRODUODENOSCOPY (EGD) WITH ESOPHAGEAL DILATION N/A 11/25/2012   Procedure: ESOPHAGOGASTRODUODENOSCOPY (EGD)  WITH ESOPHAGEAL DILATION;  Surgeon: Jerene Bears, MD;  Location: [facility];  Service: Gastroenterology;  Laterality: N/A;  . ESOPHAGOGASTRODUODENOSCOPY (EGD) WITH PROPOFOL N/A 09/18/2013   Procedure: ESOPHAGOGASTRODUODENOSCOPY (EGD) WITH PROPOFOL;  Surgeon: Lafayette Dragon, MD;  Location: [facility];  Service: Endoscopy;  Laterality: N/A;  . ESOPHAGOGASTRODUODENOSCOPY (EGD) WITH PROPOFOL N/A 05/18/2016   Procedure: ESOPHAGOGASTRODUODENOSCOPY (EGD) WITH PROPOFOL;  Surgeon: Milus Banister, MD;  Location: [facility];  Service: Endoscopy;  Laterality: N/A;  . ESOPHAGOGASTRODUODENOSCOPY (EGD) WITH PROPOFOL N/A 02/14/2018   Procedure: ESOPHAGOGASTRODUODENOSCOPY (EGD) WITH PROPOFOL;  Surgeon: Milus Banister, MD;  Location: [facility];  Service: Endoscopy;  Laterality: N/A;  . HIP ARTHROPLASTY Right 09/09/2012   Procedure: ARTHROPLASTY BIPOLAR HIP;  Surgeon: Mauri Pole, MD;  Location: [facility];  Service: Orthopedics;  Laterality: Right;  . TAH and BSO  08/21/1995    Allergies  Allergen Reactions  . Sulfamethoxazole Rash    Outpatient Encounter Medications as of 02/24/2019  Medication Sig  . acetaminophen (TYLENOL) 325 MG tablet Take 650 mg by mouth every 8 (eight) hours as needed for moderate pain.   Marland Kitchen albuterol (PROVENTIL HFA;VENTOLIN HFA) 108 (  90 BASE) MCG/ACT inhaler Inhale 2 puffs into the lungs every 6 (six) hours as needed for wheezing or shortness of breath.  Marland Kitchen ascorbic acid (VITAMIN C) 500 MG tablet Take 500 mg by mouth daily.  . chlorhexidine (PERIDEX) 0.12 % solution Use as directed 5 mLs in the mouth or throat daily. SWISH AND SPIT 5ML FOR 30 SECONDS EVERYDAY INDEFINITELY  . cholecalciferol (VITAMIN D) 1000 units tablet Take 1,000 Units by mouth daily.  . fluticasone furoate-vilanterol (BREO ELLIPTA) 100-25 MCG/INH AEPB Inhale 1 puff into the lungs daily. One puff inhale once a day, rinse mouth after each use.   . Humidifier MISC Please use humidifier to prevent excessive dryness.   Marland Kitchen HYDROcodone-acetaminophen (NORCO) 10-325 MG tablet Take 1 tablet by mouth 2 (two) times daily as needed.  . hydroxyurea (HYDREA) 500 MG capsule Take 500 mg by mouth. Take 2 capsules=1000 mg on Monday. (Wear gloves when handling medication) May take with food to minimize GI side effects.  . hydroxyurea (HYDREA) 500 MG capsule Take 500 mg by mouth. TAKE ONE CAP BY MOUTH EVERY SUNDAY, [facility] AND Friday.May take with food to minimize GI side effects.  . metoprolol tartrate (LOPRESSOR) 25 MG tablet Take 25 mg by mouth 2 (two) times daily.  . montelukast (SINGULAIR) 10 MG tablet Take 10 mg by mouth at bedtime.  . Multiple Vitamins-Minerals (PRESERVISION AREDS 2) CAPS Take 1 capsule by mouth 2 (two) times daily.  Marland Kitchen NIFEdipine (PROCARDIA) 10 MG capsule Take 10 mg by mouth 3 (three) times daily before meals. Squeeze contents of one capsule under tongue 15-30 minutes before meals 3 times a day  . nitroGLYCERIN (NITROSTAT) 0.4 MG SL tablet Place 0.4 mg under the tongue every 5 (five) minutes as needed for chest pain.  Marland Kitchen omeprazole (PRILOSEC) 20 MG capsule Take 20 mg by mouth at bedtime.   . Rivaroxaban (XARELTO) 15 MG TABS tablet Take 15 mg by mouth daily.  . sertraline (ZOLOFT) 25 MG tablet Take 25 mg by mouth daily.  . sodium fluoride (DENTA 5000 PLUS) 1.1 % CREA dental cream Place 1 application onto teeth at bedtime. Apply a thin ribbon  . zinc sulfate 220 (50 Zn) MG capsule Take 220 mg by mouth daily.  . [DISCONTINUED] HYDROcodone-acetaminophen (NORCO) 10-325 MG tablet Take 1 tablet by mouth SCHEDULED twice daily; may take an additional tablet once daily as needed for increased back pain.HOLD for sedation.  . [DISCONTINUED] HYDROcodone-acetaminophen (NORCO) 10-325 MG tablet Take 1 tablet by mouth. Once A Day - PRN  . [DISCONTINUED] atorvastatin (LIPITOR) 10 MG tablet Take 10 mg by mouth at bedtime.   . [DISCONTINUED] memantine (NAMENDA) 10 MG tablet Take 10 mg by mouth 2 (two) times daily.    No  facility-administered encounter medications on file as of 02/24/2019.    Review of Systems:  Review of Systems  Unable to perform ROS: Dementia    Health Maintenance  Topic Date Due  . TETANUS/TDAP  06/01/2027  . INFLUENZA VACCINE  Completed  . DEXA SCAN  Completed  . PNA vac Low Risk Adult  Completed    Physical Exam: Vitals:   02/24/19 1538  BP: (!) 166/62  Pulse: 84  Resp: 18  Temp: (!) 97.2 F (36.2 C)  SpO2: 94%  Weight: 104 lb 9.6 oz (47.4 kg)  Height: 5\' 1"  (1.549 m)   Body mass index is 19.76 kg/m. Physical Exam Constitutional: . Well-developed and well-nourished.  HENT:  Head: Normocephalic.  Mouth/Throat: Oropharynx is clear and moist.  Eyes: Pupils are equal, round, and reactive to light.  Neck: Neck supple.  Cardiovascular: Normal rate and normal heart sounds.  No murmur heard. Pulmonary/Chest: Effort normal and breath sounds normal. No respiratory distress. No wheezes.Bilateral Rales Abdominal: Soft. Bowel sounds are normal. No distension. There is no tenderness. There is no rebound.  Musculoskeletal: No edema.  Lymphadenopathy: none Neurological: More alert and responsive Skin: Skin is warm and dry.  Psychiatric: Normal mood and affect. Behavior is normal. Thought content normal.   Labs reviewed: Basic Metabolic Panel: Recent Labs    11/29/18 0000 02/17/19 0000 02/18/19 0000 02/20/19 0000  NA 141 142 140 145  K 4.4 4.0 4.1 3.9  CL 107 107 107 111*  CO2 25 29* 26* 22  BUN 22* 18 19 25*  CREATININE 0.9 0.6 0.7 0.7  CALCIUM 9.3 8.8  --  8.8   Liver Function Tests: Recent Labs    04/30/18 0000 10/29/18 0000 01/28/19 0000 02/18/19 0000 02/20/19 0000  AST 16 14 15 18   --   ALT 9 8 9 13   --   ALKPHOS 70 55 62 71  --   PROT 5.8 5.4*  --   --   --   ALBUMIN 3.4 3.2  --   --  3.3*   No results for input(s): LIPASE, AMYLASE in the last 8760 hours. No results for input(s): AMMONIA in the last 8760 hours. CBC: Recent Labs     11/29/18 0000 01/28/19 0000 02/18/19 0000 02/20/19 0000  WBC 7.1 5.6 4.4 6.9  NEUTROABS 4,743 3,231 2,328 4,954  HGB 12.4 11.5* 12.3 13.2  HCT 37 34* 37 39  PLT 293 289  --  296   Lipid Panel: No results for input(s): CHOL, HDL, LDLCALC, TRIG, CHOLHDL, LDLDIRECT in the last 8760 hours. Lab Results  Component Value Date   HGBA1C 5.5 10/16/2014    Procedures since last visit: No results found.  Assessment/Plan Pneumonia due to COVID-19 virus Treated with Zithromax Did not need Steroids as was not hypoxic or wheezing Decreased appetite BUN and Creat is 25/.66 AST 47 and ALT 36 All other Labs are WNL Ferritin is 173 Got 2 litre of NS Will repeat BMP in 1 week Continue to encourage PO Fluids  Essential Hypertension On Lopressor Essential Thrombocythemia Continue Hydrea Repeat CBCwas stable with no Leucocytosis Chronic DVT Continue Xarelto Anemia with Macrocytosis Stable  GERD On Prilosec  Esophageal dysmotility On Nifedipine  Dementia Discontinued Namenda for now  Supportive care Chronic Pain Discontinue Norco and make it PRN    Labs/tests ordered: Repeat BMP in week  Total time spent in this patient care encounter was  25_  minutes; greater than 50% of the visit spent counseling patient and staff, reviewing records , Labs and coordinating care for problems addressed at this encounter.

## 2019-02-25 LAB — BASIC METABOLIC PANEL
BUN: 10 (ref 4–21)
CO2: 25 — AB (ref 13–22)
Chloride: 108 (ref 99–108)
Creatinine: 0.5 (ref 0.5–1.1)
Glucose: 93
Potassium: 3.2 — AB (ref 3.4–5.3)
Sodium: 143 (ref 137–147)

## 2019-02-25 LAB — COMPREHENSIVE METABOLIC PANEL: Calcium: 8.3 — AB (ref 8.7–10.7)

## 2019-02-27 ENCOUNTER — Non-Acute Institutional Stay (SKILLED_NURSING_FACILITY): Payer: Medicare Other | Admitting: Internal Medicine

## 2019-02-27 ENCOUNTER — Encounter: Payer: Self-pay | Admitting: Internal Medicine

## 2019-02-27 DIAGNOSIS — D473 Essential (hemorrhagic) thrombocythemia: Secondary | ICD-10-CM

## 2019-02-27 DIAGNOSIS — U071 COVID-19: Secondary | ICD-10-CM | POA: Diagnosis not present

## 2019-02-27 DIAGNOSIS — R630 Anorexia: Secondary | ICD-10-CM

## 2019-02-27 DIAGNOSIS — G309 Alzheimer's disease, unspecified: Secondary | ICD-10-CM | POA: Diagnosis not present

## 2019-02-27 DIAGNOSIS — J1289 Other viral pneumonia: Secondary | ICD-10-CM

## 2019-02-27 DIAGNOSIS — J1282 Pneumonia due to coronavirus disease 2019: Secondary | ICD-10-CM

## 2019-02-27 DIAGNOSIS — F0280 Dementia in other diseases classified elsewhere without behavioral disturbance: Secondary | ICD-10-CM

## 2019-02-27 LAB — BASIC METABOLIC PANEL
BUN: 15 (ref 4–21)
CO2: 28 — AB (ref 13–22)
Chloride: 107 (ref 99–108)
Creatinine: 0.6 (ref 0.5–1.1)
Glucose: 98
Potassium: 3.5 (ref 3.4–5.3)
Sodium: 145 (ref 137–147)

## 2019-02-27 LAB — COMPREHENSIVE METABOLIC PANEL: Calcium: 8.5 — AB (ref 8.7–10.7)

## 2019-02-27 NOTE — Progress Notes (Signed)
Location:    [facility] Room Number: [place] of Service:  SNF [phone]) Provider: Virgie Dad, MD  Virgie Dad, MD  Patient Care Team: Virgie Dad, MD as PCP - General (Internal Medicine) Annia Belt, MD as Consulting Physician (Hematology and Oncology) Suella Broad, MD as Consulting Physician (Physical Medicine and Rehabilitation) Noralee Space, MD (Pulmonary Disease) [place] [name], [name], NP as Nurse Practitioner (Nurse Practitioner) Inda Castle, MD (Inactive) as Consulting Physician (Gastroenterology)  Extended Emergency Contact Information Primary Emergency Contact: Arman,Gene Address: [address]          [place], [place] [address] Johnnette Litter of [place] Phone: [phone] Work Phone: [phone] Mobile Phone: [phone] Relation: Son Secondary Emergency Contact: [name], [place] [address] Johnnette Litter of [place] Phone: [phone] Relation: Son  Code Status:  DNR Goals of care: Advanced Directive information Advanced Directives 01/10/2019  Does Patient Have a Medical Advance Directive? Yes  Type of Advance Directive Out of facility DNR (pink MOST or yellow form);Living will;Healthcare Power of Attorney  Does patient want to make changes to medical advance directive? No - Patient declined  Copy of [place] in Chart? Yes - validated most recent copy scanned in chart (See row information)  Pre-existing out of facility DNR order (yellow form or pink MOST form) Yellow form placed in chart (order not valid for inpatient use);Pink MOST form placed in chart (order not valid for inpatient use)     Chief Complaint  Patient presents with  . Acute Visit    Decreased oral intake    HPI:  Pt is a 85 y.o. female seen today for an acute visit for Decreased PO intake and releasing from Quarantine  She is Long term resident of facility She has  h/o Hypertension,Essential Thrombocythemia, H/o Esophageal Stricture, Achalasia,DVT, Hyperlipidemia, Arthritis, Cognitive Impairement, COPD  She was tested for Covid due to positive staff member.  Since then, she developed cough and her chest Xray is positive for right lower lobe infiltrate. She was started on Zithromax.  She has not had any more Fever.  No hypoxia staying above 90% on room air.  No cough. She did receive 2 L of normal saline fluids as she was not eating well.   But looks much better today more responsive.  Repeat BMP is pending. Her Appetite is better    Past Medical History:  Diagnosis Date  . Abnormal weight gain 05/09/2012  . Achalasia of esophagus 09/18/2013   09/18/13 Nifedipine 10mg  tid ac meals. 11/17/14 [facility] f/u as neeed   . Anxiety and depression 03/12/2012   Increased Sertraline to 50mg  daily 07/16/13 08/26/13 Mirtazapine 7.5mg  qhs  10/12/14 Pharm decreased Sertraline to 25mg .  05/04/15 TSH 2.89 09/15/15 pharm taper off Sertraline.  10/08/15 continue Sertraline 25mg  daily, POA desires. 12/02/15 wbc 6.1, Hgb 12.0, plt 271    . Asthmatic bronchitis   . BACK PAIN, LUMBAR 03/12/2007   Hx of lower back pain, had X-ray of her lumbar spine 09/08/12--spondylosis-better controlled with Hydrocodone/ApAp  10/325mg  ac and hs  01/01/13 lumbar spine inj. Able to ambulate with walker on unit until her fall 06/22/13. 06/25/13 unremarkable. X-ray lumbar spine. 11/10/13 dc Oxycodone prn-not used in 60 days.     . COLONIC POLYPS 03/12/2007   Initially noted April 2002 colonoscopy. Followup colonoscopy 05/31/2006 did not disclose any further polyps.    Marland Kitchen  COPD (chronic obstructive pulmonary disease) ([place])   . Coronary artery disease   . Deep vein thrombosis of left lower extremity ([place]) 07/19/2012  . Dementia ([place]) 09/16/2012   05/04/15 TSH 2.89   . Depression   . Diverticulosis 05/10/2012  . DJD (degenerative joint disease)   . Dry skin dermatitis 11/18/2012  . Edema of leg 07/01/2010  . Epistaxis  01/24/2016   01/21/16: x2, held Xarelto x 1 day, Saline Nasal spray prn, wbc 9.5, Hgb 12.4, Plt 331, Na 138, K 4.5, Bun 25, creat 0.89 01/22/16 [id number])  . Esophageal dysmotility 07/26/2013  . Esophageal dysphagia 05/27/2010   New dx of achalasia 6/ 2015 09/18/13 Nifedipine 10mg  tid ac meals.    . Esophageal stricture   . Essential hypertension, benign 10/30/2013   12/02/15 wbc 6.1, Hgb 12.0, plt 271   . Essential thrombocythemia ([place]) 06/09/2009  . Fall 06/25/2013  . GERD (gastroesophageal reflux disease)   . Hip fracture ([place]) 09/08/2012  . HYPERCHOLESTEROLEMIA 03/12/2007   08/10/15 cholesterol 119, triglycerides 143, HDL 51, LDL 39   . Lumbar back pain   . MACULAR DEGENERATION 01/26/2008   Qualifier: Diagnosis of  By: Lenna Gilford MD, Deborra Medina   . Osteoarthritis 03/12/2007  . Osteoporosis 03/12/2007   Qualifier: Diagnosis of  By: Julien Girt CMA, Marliss Czar  06/15/14 dc Vit D 1000u po daily per pharm.    . Phlebitis and thrombophlebitis of other deep vessels of lower extremities 07/10/2010  . S/P balloon dilatation of esophageal stricture 07/28/2013   07/26/13. Dr. Henrene Pastor. EGD foreign removal and Balloon dilation of esophagus. Esophageal dysmotility, suspected achalasia, dilation at the GE junction to 71mm. Candida esophagitis.    . SYNCOPE 07/30/2009   Occurred 2011. Hospitalized. Felt due to dehydration.    . TRANSIENT ISCHEMIC ATTACKS, HX OF 03/12/2007   Qualifier: Diagnosis of  By: Julien Girt CMA, Marliss Czar     Past Surgical History:  Procedure Laterality Date  . ABDOMINAL HYSTERECTOMY     and bso  . APPENDECTOMY    . BOTOX INJECTION N/A 09/18/2013   Procedure: BOTOX INJECTION;  Surgeon: Lafayette Dragon, MD;  Location: [facility];  Service: Endoscopy;  Laterality: N/A;  . BOTOX INJECTION N/A 10/16/2014   Procedure: BOTOX INJECTION;  Surgeon: Inda Castle, MD;  Location: [facility];  Service: Endoscopy;  Laterality: N/A;  . BOTOX INJECTION N/A 05/18/2016   Procedure: BOTOX INJECTION;  Surgeon: Milus Banister, MD;   Location: [facility];  Service: Endoscopy;  Laterality: N/A;  . BOTOX INJECTION N/A 02/14/2018   Procedure: BOTOX INJECTION;  Surgeon: Milus Banister, MD;  Location: [facility];  Service: Endoscopy;  Laterality: N/A;  . CATARACT EXTRACTION W/ INTRAOCULAR LENS  IMPLANT, BILATERAL    . CHOLECYSTECTOMY    . ESOPHAGEAL MANOMETRY N/A 08/25/2013   Procedure: ESOPHAGEAL MANOMETRY (EM);  Surgeon: Jerene Bears, MD;  Location: [facility];  Service: Gastroenterology;  Laterality: N/A;  . ESOPHAGOGASTRODUODENOSCOPY N/A 07/26/2013   Procedure: ESOPHAGOGASTRODUODENOSCOPY (EGD);  Surgeon: Irene Shipper, MD;  Location: [facility] ENDOSCOPY;  Service: Endoscopy;  Laterality: N/A;  . ESOPHAGOGASTRODUODENOSCOPY N/A 10/16/2014   Procedure: ESOPHAGOGASTRODUODENOSCOPY (EGD);  Surgeon: Inda Castle, MD;  Location: [facility];  Service: Endoscopy;  Laterality: N/A;  with botox  . ESOPHAGOGASTRODUODENOSCOPY (EGD) WITH ESOPHAGEAL DILATION N/A 11/25/2012   Procedure: ESOPHAGOGASTRODUODENOSCOPY (EGD) WITH ESOPHAGEAL DILATION;  Surgeon: Jerene Bears, MD;  Location: [facility];  Service: Gastroenterology;  Laterality: N/A;  . ESOPHAGOGASTRODUODENOSCOPY (EGD) WITH PROPOFOL N/A 09/18/2013   Procedure: ESOPHAGOGASTRODUODENOSCOPY (EGD) WITH  PROPOFOL;  Surgeon: Lafayette Dragon, MD;  Location: [facility];  Service: Endoscopy;  Laterality: N/A;  . ESOPHAGOGASTRODUODENOSCOPY (EGD) WITH PROPOFOL N/A 05/18/2016   Procedure: ESOPHAGOGASTRODUODENOSCOPY (EGD) WITH PROPOFOL;  Surgeon: Milus Banister, MD;  Location: [facility];  Service: Endoscopy;  Laterality: N/A;  . ESOPHAGOGASTRODUODENOSCOPY (EGD) WITH PROPOFOL N/A 02/14/2018   Procedure: ESOPHAGOGASTRODUODENOSCOPY (EGD) WITH PROPOFOL;  Surgeon: Milus Banister, MD;  Location: [facility];  Service: Endoscopy;  Laterality: N/A;  . HIP ARTHROPLASTY Right 09/09/2012   Procedure: ARTHROPLASTY BIPOLAR HIP;  Surgeon: Mauri Pole, MD;  Location: [facility];  Service: Orthopedics;   Laterality: Right;  . TAH and BSO  08/21/1995    Allergies  Allergen Reactions  . Sulfamethoxazole Rash    Allergies as of 02/27/2019      Reactions   Sulfamethoxazole Rash      Medication List       Accurate as of February 27, 2019 11:14 AM. If you have any questions, ask your nurse or doctor.        acetaminophen 325 MG tablet Commonly known as: TYLENOL Take 650 mg by mouth every 8 (eight) hours as needed for moderate pain.   albuterol 108 (90 Base) MCG/ACT inhaler Commonly known as: VENTOLIN HFA Inhale 2 puffs into the lungs every 6 (six) hours as needed for wheezing or shortness of breath.   ascorbic acid 500 MG tablet Commonly known as: VITAMIN C Take 500 mg by mouth daily.   Breo Ellipta 100-25 MCG/INH Aepb Generic drug: fluticasone furoate-vilanterol Inhale 1 puff into the lungs daily. One puff inhale once a day, rinse mouth after each use.   chlorhexidine 0.12 % solution Commonly known as: PERIDEX Use as directed 5 mLs in the mouth or throat daily. SWISH AND SPIT 5ML FOR 30 SECONDS EVERYDAY INDEFINITELY   cholecalciferol 1000 units tablet Commonly known as: VITAMIN D Take 1,000 Units by mouth daily.   Denta 5000 Plus 1.1 % Crea dental cream Generic drug: sodium fluoride Place 1 application onto teeth at bedtime. Apply a thin ribbon   Humidifier Misc Please use humidifier to prevent excessive dryness.   HYDROcodone-acetaminophen 10-325 MG tablet Commonly known as: NORCO Take 1 tablet by mouth 2 (two) times daily as needed.   hydroxyurea 500 MG capsule Commonly known as: HYDREA Take 500 mg by mouth. Take 2 capsules=1000 mg on Monday. (Wear gloves when handling medication) May take with food to minimize GI side effects.   hydroxyurea 500 MG capsule Commonly known as: HYDREA Take 500 mg by mouth. TAKE ONE CAP BY MOUTH EVERY SUNDAY, [facility] AND Friday.May take with food to minimize GI side effects.   metoprolol tartrate 25 MG tablet Commonly known  as: LOPRESSOR Take 25 mg by mouth 2 (two) times daily.   montelukast 10 MG tablet Commonly known as: SINGULAIR Take 10 mg by mouth at bedtime.   NIFEdipine 10 MG capsule Commonly known as: PROCARDIA Take 10 mg by mouth 3 (three) times daily before meals. Squeeze contents of one capsule under tongue 15-30 minutes before meals 3 times a day   nitroGLYCERIN 0.4 MG SL tablet Commonly known as: NITROSTAT Place 0.4 mg under the tongue every 5 (five) minutes as needed for chest pain.   omeprazole 20 MG capsule Commonly known as: PRILOSEC Take 20 mg by mouth at bedtime.   potassium chloride 10 MEQ tablet Commonly known as: KLOR-CON Take 10 mEq by mouth daily.   PreserVision AREDS 2 Caps Take 1 capsule by mouth 2 (two)  times daily.   Rivaroxaban 15 MG Tabs tablet Commonly known as: XARELTO Take 15 mg by mouth daily.   sertraline 25 MG tablet Commonly known as: ZOLOFT Take 25 mg by mouth daily.   zinc sulfate 220 (50 Zn) MG capsule Take 220 mg by mouth daily.       Review of Systems  Unable to perform ROS: Dementia    Immunization History  Administered Date(s) Administered  . Influenza Split 11/16/2010, 12/13/2011  . Influenza Whole 11/27/2009, 11/29/2017  . Influenza, High Dose Seasonal PF 11/29/2018  . Influenza-Unspecified 12/31/2013, 11/18/2014, 12/14/2015, 12/11/2016  . PPD Test 09/11/2012  . Pneumococcal Conjugate-13 06/01/2017  . Pneumococcal Polysaccharide-23 02/28/1988  . Tdap 05/31/2017   Pertinent  Health Maintenance Due  Topic Date Due  . INFLUENZA VACCINE  Completed  . DEXA SCAN  Completed  . PNA vac Low Risk Adult  Completed   Fall Risk  02/08/2017 02/08/2017 10/04/2015 12/28/2014 10/23/2014  Falls in the past year? No Exclusion - non ambulatory No Yes Yes  Number falls in past yr: - - - 1 1  Injury with Fall? - - - No No  Risk for fall due to : - Impaired balance/gait;Impaired vision Impaired mobility;Impaired balance/gait History of fall(s);Impaired  balance/gait;Impaired mobility Impaired mobility  Follow up - - - Falls prevention discussed Falls evaluation completed   Functional Status Survey:    Vitals:   02/27/19 1021  BP: 140/62  Pulse: 84  Resp: 18  Temp: 97.9 F (36.6 C)  SpO2: 93%  Weight: 104 lb 9.6 oz (47.4 kg)  Height: 5\' 1"  (1.549 m)   Body mass index is 19.76 kg/m. Physical Exam  Constitutional: Well-developed and well-nourished.  HENT:  Head: Normocephalic.  Mouth/Throat: Oropharynx is clear and moist.  Eyes: Pupils are equal, round, and reactive to light.  Neck: Neck supple.  Cardiovascular: Normal rate and normal heart sounds.  No murmur heard. Pulmonary/Chest: Effort normal and breath sounds normal. No respiratory distress. No wheezes. She has no rales.  Abdominal: Soft. Bowel sounds are normal. No distension. There is no tenderness. There is no rebound.  Musculoskeletal: No edema.  Lymphadenopathy: none Neurological: Alert and Responsive. More Interactive today  Skin: Skin is warm and dry.  Psychiatric: Normal mood and affect. Behavior is normal. Thought content normal.    Labs reviewed: Recent Labs    02/17/19 0000 02/18/19 0000 02/20/19 0000 02/25/19 0000  NA 142 140 145 143  K 4.0 4.1 3.9 3.2*  CL 107 107 111* 108  CO2 29* 26* 22 25*  BUN 18 19 25* 10  CREATININE 0.6 0.7 0.7 0.5  CALCIUM 8.8  --  8.8 8.3*   Recent Labs    04/30/18 0000 10/29/18 0000 01/28/19 0000 02/18/19 0000 02/20/19 0000  AST 16 14 15 18   --   ALT 9 8 9 13   --   ALKPHOS 70 55 62 71  --   PROT 5.8 5.4*  --   --   --   ALBUMIN 3.4 3.2  --   --  3.3*   Recent Labs    11/29/18 0000 01/28/19 0000 02/18/19 0000 02/20/19 0000  WBC 7.1 5.6 4.4 6.9  NEUTROABS 4,743 3,231 2,328 4,954  HGB 12.4 11.5* 12.3 13.2  HCT 37 34* 37 39  PLT 293 289  --  296   Lab Results  Component Value Date   TSH 2.00 09/21/2016   Lab Results  Component Value Date   HGBA1C 5.5 10/16/2014   Lab  Results  Component Value  Date   CHOL 143 09/18/2017   HDL 50 09/18/2017   LDLCALC 70 09/18/2017   LDLDIRECT 152.3 06/04/2009   TRIG 147 09/18/2017   CHOLHDL 3 09/13/2011    Significant Diagnostic Results in last 30 days:  No results found.  Assessment/Plan . Pneumonia due to COVID-19 virus Treated with Zithromax Did not need Steroids as was not hypoxic or wheezing Not Hypoxic. No Cough No Fever Can be discharged to her Room in Few days Decreased appetite Repeat BMP Pending Continue Encouraging PO fluids Addendum BUN and Creat came back in Normal Limits Sodium 145 Encourage PO Fluids  Other Issues Essential Hypertension On Lopressor Essential Thrombocythemia Continue Hydrea Repeat CBCwas stable with no Leucocytosis Chronic DVT Continue Xarelto Anemia with Macrocytosis Stable  GERD On Prilosec  Esophageal dysmotility On Nifedipine  Dementia Discontinued Namenda for now  Supportive care Chronic Pain Discontinue Norco and make it PRN     Family/ staff Communication:   Labs/tests ordered:   Total time spent in this patient care encounter was  25_  minutes; greater than 50% of the visit spent counseling patient and staff, reviewing records , Labs and coordinating care for problems addressed at this encounter.

## 2019-03-11 ENCOUNTER — Encounter: Payer: Self-pay | Admitting: Internal Medicine

## 2019-03-11 ENCOUNTER — Non-Acute Institutional Stay (SKILLED_NURSING_FACILITY): Payer: Medicare Other | Admitting: Internal Medicine

## 2019-03-11 DIAGNOSIS — D473 Essential (hemorrhagic) thrombocythemia: Secondary | ICD-10-CM

## 2019-03-11 DIAGNOSIS — I825Y9 Chronic embolism and thrombosis of unspecified deep veins of unspecified proximal lower extremity: Secondary | ICD-10-CM

## 2019-03-11 DIAGNOSIS — R5383 Other fatigue: Secondary | ICD-10-CM | POA: Diagnosis not present

## 2019-03-11 DIAGNOSIS — R630 Anorexia: Secondary | ICD-10-CM | POA: Diagnosis not present

## 2019-03-11 DIAGNOSIS — U071 COVID-19: Secondary | ICD-10-CM | POA: Diagnosis not present

## 2019-03-11 DIAGNOSIS — J189 Pneumonia, unspecified organism: Secondary | ICD-10-CM | POA: Diagnosis not present

## 2019-03-11 LAB — COMPREHENSIVE METABOLIC PANEL
Albumin: 2.9 — AB (ref 3.5–5.0)
Calcium: 9.1 (ref 8.7–10.7)
Globulin: 3.5

## 2019-03-11 LAB — BASIC METABOLIC PANEL
BUN: 20 (ref 4–21)
CO2: 28 — AB (ref 13–22)
Chloride: 108 (ref 99–108)
Creatinine: 0.6 (ref 0.5–1.1)
Glucose: 134
Potassium: 3.4 (ref 3.4–5.3)
Sodium: 146 (ref 137–147)

## 2019-03-11 LAB — HEPATIC FUNCTION PANEL
ALT: 30 (ref 7–35)
AST: 45 — AB (ref 13–35)
Alkaline Phosphatase: 121 (ref 25–125)
Bilirubin, Total: 0.4

## 2019-03-11 LAB — CBC AND DIFFERENTIAL
HCT: 35 — AB (ref 36–46)
Hemoglobin: 11.5 — AB (ref 12.0–16.0)
Neutrophils Absolute: 10122
Platelets: 517 — AB (ref 150–399)
WBC: 12.7

## 2019-03-11 LAB — CBC: RBC: 3.43 — AB (ref 3.87–5.11)

## 2019-03-11 NOTE — Progress Notes (Signed)
Location:  [place] Room Number: 35 Place of Service:  SNF (31)  Provider:   Code Status: DNR Goals of Care:  Advanced Directives 03/11/2019  Does Patient Have a Medical Advance Directive? Yes  Type of Advance Directive Out of facility DNR (pink MOST or yellow form);Living will;Healthcare Power of Attorney  Does patient want to make changes to medical advance directive? No - Patient declined  Copy of [place] in Chart? Yes - validated most recent copy scanned in chart (See row information)  Pre-existing out of facility DNR order (yellow form or pink MOST form) Pink Most/Yellow Form available - Physician notified to receive inpatient order     Chief Complaint  Patient presents with  . Medical Management of Chronic Issues    HPI: Patient is a 86 y.o. female seen today for medical management of chronic diseases.   She is Long term resident of facility She has h/o Hypertension,Essential Thrombocythemia, H/o Esophageal Stricture, Achalasia,DVT, Hyperlipidemia, Arthritis, Cognitive Impairement, COPD  She recently Diagnosed with Covid . Stayed in the Acute unit for 3 weeks Was treated with Zithromax for Infiltrate. She does not have Cough or Fever or Hypoxia On RA.  Continues to have Poor Appetite. Has lost almost 8 lbs since then. Only eating 25 % of her food. Looks very Sleepy and tired. Therapy is trying to work with her.   Past Medical History:  Diagnosis Date  . Abnormal weight gain 05/09/2012  . Achalasia of esophagus 09/18/2013   09/18/13 Nifedipine 10mg  tid ac meals. 11/17/14 [facility] f/u as neeed   . Anxiety and depression 03/12/2012   Increased Sertraline to 50mg  daily 07/16/13 08/26/13 Mirtazapine 7.5mg  qhs  10/12/14 Pharm decreased Sertraline to 25mg .  05/04/15 TSH 2.89 09/15/15 pharm taper off Sertraline.  10/08/15 continue Sertraline 25mg  daily, POA desires. 12/02/15 wbc 6.1, Hgb 12.0, plt 271    . Asthmatic bronchitis   . BACK  PAIN, LUMBAR 03/12/2007   Hx of lower back pain, had X-ray of her lumbar spine 09/08/12--spondylosis-better controlled with Hydrocodone/ApAp  10/325mg  ac and hs  01/01/13 lumbar spine inj. Able to ambulate with walker on unit until her fall 06/22/13. 06/25/13 unremarkable. X-ray lumbar spine. 11/10/13 dc Oxycodone prn-not used in 60 days.     . COLONIC POLYPS 03/12/2007   Initially noted April 2002 colonoscopy. Followup colonoscopy 05/31/2006 did not disclose any further polyps.    Marland Kitchen COPD (chronic obstructive pulmonary disease) ([place])   . Coronary artery disease   . Deep vein thrombosis of left lower extremity ([place]) 07/19/2012  . Dementia ([place]) 09/16/2012   05/04/15 TSH 2.89   . Depression   . Diverticulosis 05/10/2012  . DJD (degenerative joint disease)   . Dry skin dermatitis 11/18/2012  . Edema of leg 07/01/2010  . Epistaxis 01/24/2016   01/21/16: x2, held Xarelto x 1 day, Saline Nasal spray prn, wbc 9.5, Hgb 12.4, Plt 331, Na 138, K 4.5, Bun 25, creat 0.89 01/22/16 [id number])  . Esophageal dysmotility 07/26/2013  . Esophageal dysphagia 05/27/2010   New dx of achalasia 6/ 2015 09/18/13 Nifedipine 10mg  tid ac meals.    . Esophageal stricture   . Essential hypertension, benign 10/30/2013   12/02/15 wbc 6.1, Hgb 12.0, plt 271   . Essential thrombocythemia ([place]) 06/09/2009  . Fall 06/25/2013  . GERD (gastroesophageal reflux disease)   . Hip fracture ([place]) 09/08/2012  . HYPERCHOLESTEROLEMIA 03/12/2007   08/10/15 cholesterol 119, triglycerides 143, HDL 51, LDL 39   .  Lumbar back pain   . MACULAR DEGENERATION 01/26/2008   Qualifier: Diagnosis of  By: Lenna Gilford MD, Deborra Medina   . Osteoarthritis 03/12/2007  . Osteoporosis 03/12/2007   Qualifier: Diagnosis of  By: Julien Girt CMA, Marliss Czar  06/15/14 dc Vit D 1000u po daily per pharm.    . Phlebitis and thrombophlebitis of other deep vessels of lower extremities 07/10/2010  . S/P balloon dilatation of esophageal stricture 07/28/2013   07/26/13. Dr. Henrene Pastor. EGD foreign removal and Balloon  dilation of esophagus. Esophageal dysmotility, suspected achalasia, dilation at the GE junction to 22mm. Candida esophagitis.    . SYNCOPE 07/30/2009   Occurred 2011. Hospitalized. Felt due to dehydration.    . TRANSIENT ISCHEMIC ATTACKS, HX OF 03/12/2007   Qualifier: Diagnosis of  By: Julien Girt CMA, Marliss Czar      Past Surgical History:  Procedure Laterality Date  . ABDOMINAL HYSTERECTOMY     and bso  . APPENDECTOMY    . BOTOX INJECTION N/A 09/18/2013   Procedure: BOTOX INJECTION;  Surgeon: Lafayette Dragon, MD;  Location: [facility];  Service: Endoscopy;  Laterality: N/A;  . BOTOX INJECTION N/A 10/16/2014   Procedure: BOTOX INJECTION;  Surgeon: Inda Castle, MD;  Location: [facility];  Service: Endoscopy;  Laterality: N/A;  . BOTOX INJECTION N/A 05/18/2016   Procedure: BOTOX INJECTION;  Surgeon: Milus Banister, MD;  Location: [facility];  Service: Endoscopy;  Laterality: N/A;  . BOTOX INJECTION N/A 02/14/2018   Procedure: BOTOX INJECTION;  Surgeon: Milus Banister, MD;  Location: [facility];  Service: Endoscopy;  Laterality: N/A;  . CATARACT EXTRACTION W/ INTRAOCULAR LENS  IMPLANT, BILATERAL    . CHOLECYSTECTOMY    . ESOPHAGEAL MANOMETRY N/A 08/25/2013   Procedure: ESOPHAGEAL MANOMETRY (EM);  Surgeon: Jerene Bears, MD;  Location: [facility];  Service: Gastroenterology;  Laterality: N/A;  . ESOPHAGOGASTRODUODENOSCOPY N/A 07/26/2013   Procedure: ESOPHAGOGASTRODUODENOSCOPY (EGD);  Surgeon: Irene Shipper, MD;  Location[facility] END[facility];  Service: Endoscopy;  Laterality: N/A;  . ESOPHAGOGASTRODUODENOSCOPY N/A 10/16/2014   Procedure: ESOPHAGOGASTRODUODENOSCOPY (EGD);  Surgeon: Inda Castle, MD;  Location: [facility];  Service: Endoscopy;  Laterality: N/A;  with botox  . ESOPHAGOGASTRODUODENOSCOPY (EGD) WITH ESOPHAGEAL DILATION N/A 11/25/2012   Procedure: ESOPHAGOGASTRODUODENOSCOPY (EGD) WITH ESOPHAGEAL DILATION;  Surgeon: Jerene Bears, MD;  Location: [facility];  Service: Gastroenterology;   Laterality: N/A;  . ESOPHAGOGASTRODUODENOSCOPY (EGD) WITH PROPOFOL N/A 09/18/2013   Procedure: ESOPHAGOGASTRODUODENOSCOPY (EGD) WITH PROPOFOL;  Surgeon: Lafayette Dragon, MD;  Location: [facility];  Service: Endoscopy;  Laterality: N/A;  . ESOPHAGOGASTRODUODENOSCOPY (EGD) WITH PROPOFOL N/A 05/18/2016   Procedure: ESOPHAGOGASTRODUODENOSCOPY (EGD) WITH PROPOFOL;  Surgeon: Milus Banister, MD;  Location: [facility];  Service: Endoscopy;  Laterality: N/A;  . ESOPHAGOGASTRODUODENOSCOPY (EGD) WITH PROPOFOL N/A 02/14/2018   Procedure: ESOPHAGOGASTRODUODENOSCOPY (EGD) WITH PROPOFOL;  Surgeon: Milus Banister, MD;  Location: [facility];  Service: Endoscopy;  Laterality: N/A;  . HIP ARTHROPLASTY Right 09/09/2012   Procedure: ARTHROPLASTY BIPOLAR HIP;  Surgeon: Mauri Pole, MD;  Location: [facility];  Service: Orthopedics;  Laterality: Right;  . TAH and BSO  08/21/1995    Allergies  Allergen Reactions  . Sulfamethoxazole Rash    Outpatient Encounter Medications as of 03/11/2019  Medication Sig  . acetaminophen (TYLENOL) 325 MG tablet Take 650 mg by mouth every 8 (eight) hours as needed for moderate pain.   Marland Kitchen albuterol (PROVENTIL HFA;VENTOLIN HFA) 108 (90 BASE) MCG/ACT inhaler Inhale 2 puffs into the lungs every 6 (six) hours as needed  for wheezing or shortness of breath.  Marland Kitchen ascorbic acid (VITAMIN C) 500 MG tablet Take 500 mg by mouth daily.  . chlorhexidine (PERIDEX) 0.12 % solution Use as directed 5 mLs in the mouth or throat daily. SWISH AND SPIT 5ML FOR 30 SECONDS EVERYDAY INDEFINITELY  . cholecalciferol (VITAMIN D) 1000 units tablet Take 1,000 Units by mouth daily.  . fluticasone furoate-vilanterol (BREO ELLIPTA) 100-25 MCG/INH AEPB Inhale 1 puff into the lungs daily. One puff inhale once a day, rinse mouth after each use.   . Humidifier MISC Please use humidifier to prevent excessive dryness.  Marland Kitchen HYDROcodone-acetaminophen (NORCO) 10-325 MG tablet Take 1 tablet by mouth 2 (two) times daily as needed.   . hydroxyurea (HYDREA) 500 MG capsule Take 500 mg by mouth. Take 2 capsules=1000 mg on Monday. (Wear gloves when handling medication) May take with food to minimize GI side effects.  . hydroxyurea (HYDREA) 500 MG capsule Take 500 mg by mouth. TAKE ONE CAP BY MOUTH EVERY SUNDAY, [facility] AND Friday.May take with food to minimize GI side effects.  . metoprolol tartrate (LOPRESSOR) 25 MG tablet Take 25 mg by mouth 2 (two) times daily.  . montelukast (SINGULAIR) 10 MG tablet Take 10 mg by mouth at bedtime.  . Multiple Vitamins-Minerals (PRESERVISION AREDS 2) CAPS Take 1 capsule by mouth 2 (two) times daily.  Marland Kitchen NIFEdipine (PROCARDIA) 10 MG capsule Take 10 mg by mouth 3 (three) times daily before meals. Squeeze contents of one capsule under tongue 15-30 minutes before meals 3 times a day  . nitroGLYCERIN (NITROSTAT) 0.4 MG SL tablet Place 0.4 mg under the tongue every 5 (five) minutes as needed for chest pain.  Marland Kitchen omeprazole (PRILOSEC) 20 MG capsule Take 20 mg by mouth at bedtime.   . Rivaroxaban (XARELTO) 15 MG TABS tablet Take 15 mg by mouth daily.  . sertraline (ZOLOFT) 25 MG tablet Take 25 mg by mouth daily.  . sodium fluoride (DENTA 5000 PLUS) 1.1 % CREA dental cream Place 1 application onto teeth at bedtime. Apply a thin ribbon  . zinc sulfate 220 (50 Zn) MG capsule Take 220 mg by mouth daily.  . [DISCONTINUED] potassium chloride (KLOR-CON) 10 MEQ tablet Take 10 mEq by mouth daily.   No facility-administered encounter medications on file as of 03/11/2019.    Review of Systems:  Review of Systems  Unable to perform ROS: Other    Health Maintenance  Topic Date Due  . TETANUS/TDAP  06/01/2027  . INFLUENZA VACCINE  Completed  . DEXA SCAN  Completed  . PNA vac Low Risk Adult  Completed    Physical Exam: Vitals:   03/11/19 1150  BP: (!) 144/80  Pulse: 88  Resp: 18  Temp: 97.8 F (36.6 C)  SpO2: 94%  Weight: 96 lb 3.2 oz (43.6 kg)  Height: 5\' 1"  (1.549 m)   Body mass index is  18.18 kg/m. Physical Exam Vitals reviewed.  Constitutional:      Comments: Is Sleepy Open her eyes but then will close them again  HENT:     Head: Normocephalic.     Nose: Nose normal.     Mouth/Throat:     Mouth: Mucous membranes are moist.     Pharynx: Oropharynx is clear.  Eyes:     Pupils: Pupils are equal, round, and reactive to light.  Cardiovascular:     Rate and Rhythm: Normal rate and regular rhythm.     Pulses: Normal pulses.  Pulmonary:     Effort: Pulmonary effort  is normal.     Breath sounds: Normal breath sounds. No wheezing or rales.  Abdominal:     General: Abdomen is flat. Bowel sounds are normal.     Palpations: Abdomen is soft.  Musculoskeletal:        General: No swelling.     Cervical back: Neck supple.  Skin:    General: Skin is warm.  Neurological:     General: No focal deficit present.  Psychiatric:        Mood and Affect: Mood normal.        Thought Content: Thought content normal.     Labs reviewed: Basic Metabolic Panel: Recent Labs    02/17/19 0000 02/18/19 0000 02/20/19 0000 02/25/19 0000  NA 142 140 145 143  K 4.0 4.1 3.9 3.2*  CL 107 107 111* 108  CO2 29* 26* 22 25*  BUN 18 19 25* 10  CREATININE 0.6 0.7 0.7 0.5  CALCIUM 8.8  --  8.8 8.3*   Liver Function Tests: Recent Labs    04/30/18 0000 10/29/18 0000 01/28/19 0000 02/18/19 0000 02/20/19 0000  AST 16 14 15 18   --   ALT 9 8 9 13   --   ALKPHOS 70 55 62 71  --   PROT 5.8 5.4*  --   --   --   ALBUMIN 3.4 3.2  --   --  3.3*   No results for input(s): LIPASE, AMYLASE in the last 8760 hours. No results for input(s): AMMONIA in the last 8760 hours. CBC: Recent Labs    11/29/18 0000 01/28/19 0000 02/18/19 0000 02/20/19 0000  WBC 7.1 5.6 4.4 6.9  NEUTROABS 4,743 3,231 2,328 4,954  HGB 12.4 11.5* 12.3 13.2  HCT 37 34* 37 39  PLT 293 289  --  296   Lipid Panel: No results for input(s): CHOL, HDL, LDLCALC, TRIG, CHOLHDL, LDLDIRECT in the last 8760 hours. Lab  Results  Component Value Date   HGBA1C 5.5 10/16/2014    Procedures since last visit: No results found.  Assessment/Plan 1. Poor appetite Most Likely Post Covid Repeat BMP Cannot use Remeron yet as she is sleepy already Discontinue Hydrocodone and Zinc  2. Lethargy Repeat BMP and CBC to Rule out Infection Addendum Her White Count is elevated with Left shift  Will Order UA and Chest Xray  3. S/P Covid Pneumonia On RA No Fever or Cough   4. Chronic deep vein thrombosis (DVT) of proximal vein of lower extremity, unspecified laterality (HCC) Continue on Xarelto  5. Essential thrombocythemia ([place]) On Hydrea  Anemia with Macrocytosis Stable  GERD On Prilosec  Esophageal dysmotility On Nifedipine  Dementia Discontinued Namenda before Supportive care  Labs/tests ordered:  * No order type specified * Next appt:  Visit date not found  Total time spent in this patient care encounter was 45 _  minutes; greater than 50% of the visit spent counseling patient and staff, reviewing records , Labs and coordinating care for problems addressed at this encounter.

## 2019-03-12 ENCOUNTER — Non-Acute Institutional Stay (SKILLED_NURSING_FACILITY): Payer: Medicare Other | Admitting: Nurse Practitioner

## 2019-03-12 ENCOUNTER — Encounter: Payer: Self-pay | Admitting: Nurse Practitioner

## 2019-03-12 DIAGNOSIS — E860 Dehydration: Secondary | ICD-10-CM

## 2019-03-12 DIAGNOSIS — D72829 Elevated white blood cell count, unspecified: Secondary | ICD-10-CM

## 2019-03-12 DIAGNOSIS — I10 Essential (primary) hypertension: Secondary | ICD-10-CM

## 2019-03-12 DIAGNOSIS — R627 Adult failure to thrive: Secondary | ICD-10-CM | POA: Diagnosis not present

## 2019-03-12 DIAGNOSIS — G309 Alzheimer's disease, unspecified: Secondary | ICD-10-CM

## 2019-03-12 DIAGNOSIS — K219 Gastro-esophageal reflux disease without esophagitis: Secondary | ICD-10-CM

## 2019-03-12 DIAGNOSIS — J4489 Other specified chronic obstructive pulmonary disease: Secondary | ICD-10-CM

## 2019-03-12 DIAGNOSIS — J449 Chronic obstructive pulmonary disease, unspecified: Secondary | ICD-10-CM

## 2019-03-12 DIAGNOSIS — F0280 Dementia in other diseases classified elsewhere without behavioral disturbance: Secondary | ICD-10-CM

## 2019-03-12 DIAGNOSIS — F419 Anxiety disorder, unspecified: Secondary | ICD-10-CM

## 2019-03-12 DIAGNOSIS — J189 Pneumonia, unspecified organism: Secondary | ICD-10-CM | POA: Diagnosis not present

## 2019-03-12 DIAGNOSIS — M5440 Lumbago with sciatica, unspecified side: Secondary | ICD-10-CM | POA: Diagnosis not present

## 2019-03-12 DIAGNOSIS — G8929 Other chronic pain: Secondary | ICD-10-CM

## 2019-03-12 DIAGNOSIS — Z86718 Personal history of other venous thrombosis and embolism: Secondary | ICD-10-CM

## 2019-03-12 DIAGNOSIS — D473 Essential (hemorrhagic) thrombocythemia: Secondary | ICD-10-CM

## 2019-03-12 DIAGNOSIS — M5442 Lumbago with sciatica, left side: Secondary | ICD-10-CM

## 2019-03-12 DIAGNOSIS — F32A Depression, unspecified: Secondary | ICD-10-CM

## 2019-03-12 DIAGNOSIS — F329 Major depressive disorder, single episode, unspecified: Secondary | ICD-10-CM

## 2019-03-12 NOTE — Assessment & Plan Note (Signed)
Her mood is stable, continue Sertraline.  

## 2019-03-12 NOTE — Assessment & Plan Note (Signed)
Progressed since onset of COVID PNA, off Memantine, may consider Hospice care is not better after ABT/IVF

## 2019-03-12 NOTE — Assessment & Plan Note (Signed)
Plt is elevated 517, hemoconcentration contributory, continue Hydrea.

## 2019-03-12 NOTE — Assessment & Plan Note (Signed)
Continue Xarelto 

## 2019-03-12 NOTE — Assessment & Plan Note (Signed)
Will reinstate Norco prn for 72hours, observe.

## 2019-03-12 NOTE — Assessment & Plan Note (Signed)
No O2 desaturation, no wheezes. Continue Breo

## 2019-03-12 NOTE — Assessment & Plan Note (Signed)
Mild, identified left lower lobe PNA, dehydration can be contributory, pending UA.

## 2019-03-12 NOTE — Progress Notes (Signed)
Location:   [place] Room Number: 35 Place of Service:  SNF (31) Provider:  Dawnita Molner NP  Virgie Dad, MD  Patient Care Team: Virgie Dad, MD as PCP - General (Internal Medicine) Annia Belt, MD as Consulting Physician (Hematology and Oncology) Suella Broad, MD as Consulting Physician (Physical Medicine and Rehabilitation) Noralee Space, MD (Pulmonary Disease) [place] Fernando X, NP as Nurse Practitioner (Nurse Practitioner) Inda Castle, MD (Inactive) as Consulting Physician (Gastroenterology)  Extended Emergency Contact Information Primary Emergency Contact: Dominique,Gene Address: [address]          [place], [place] [address] [place] Phone: [phone] Work Phone: [phone] Mobile Phone: [phone] Relation: Son Secondary Emergency Contact: [name], [place] [address] [place] Phone: [phone] Relation: Son  Code Status:  DNR Goals of care: Advanced Directive information Advanced Directives 03/11/2019  Does Patient Have a Medical Advance Directive? Yes  Type of Advance Directive Out of facility DNR (pink MOST or yellow form);Living will;Healthcare Power of Attorney  Does patient want to make changes to medical advance directive? No - Patient declined  Copy of [place] in Chart? Yes - validated most recent copy scanned in chart (See row information)  Pre-existing out of facility DNR order (yellow form or pink MOST form) Pink Most/Yellow Form available - Physician notified to receive inpatient order     Chief Complaint  Patient presents with  . Acute Visit    PNA, weak, poor oral intake, simplify meds, may be hospice after ABT     HPI:  Pt is a 86 y.o. female seen today for an acute visit  for persisted poor appetite, dry mouth/skin, and generalized weakness, 03/11/19 Na 146, K 3.4, Bun 20, creat 0.56, AST  45, ALT 30, wbc 12.7, Hgb 11.5, plt 517, neutrophils 79.2.  PNA, 03/11/19 CXR pulmonary infiltrate in the left lung base, small pleural effusion. Pm Breo, prn Albuterol.   Hx of dementia, progressed since onset of COVID PNA, poor appetite, last 7-8Ibs. Off Memantine.   The patient was moaning upon my examination, calling mom, mom. Off Norco. Hx of back pain  Hx of DVTs, on Xarelto  Hx of Essential thrombocythemia,  on Hydrea  Hx of GERD, on Omeprazole, Nifedipine  Hx of HTN, blood pressure is controlled, on Metoprolol   Hx of depression, stable, on Sertraline.       Past Medical History:  Diagnosis Date  . Abnormal weight gain 05/09/2012  . Achalasia of esophagus 09/18/2013   09/18/13 Nifedipine 10mg  tid ac meals. 11/17/14 [facility] f/u as neeed   . Anxiety and depression 03/12/2012   Increased Sertraline to 50mg  daily 07/16/13 08/26/13 Mirtazapine 7.5mg  qhs  10/12/14 Pharm decreased Sertraline to 25mg .  05/04/15 TSH 2.89 09/15/15 pharm taper off Sertraline.  10/08/15 continue Sertraline 25mg  daily, POA desires. 12/02/15 wbc 6.1, Hgb 12.0, plt 271    . Asthmatic bronchitis   . BACK PAIN, LUMBAR 03/12/2007   Hx of lower back pain, had X-ray of her lumbar spine 09/08/12--spondylosis-better controlled with Hydrocodone/ApAp  10/325mg  ac and hs  01/01/13 lumbar spine inj. Able to ambulate with walker on unit until her fall 06/22/13. 06/25/13 unremarkable. X-ray lumbar spine. 11/10/13 dc Oxycodone prn-not used in 60 days.     . COLONIC POLYPS 03/12/2007   Initially noted April 2002 colonoscopy. Followup colonoscopy 05/31/2006 did not  disclose any further polyps.    Marland Kitchen COPD (chronic obstructive pulmonary disease) ([place])   . Coronary artery disease   . Deep vein thrombosis of left lower extremity ([place]) 07/19/2012  . Dementia ([place]) 09/16/2012   05/04/15 TSH 2.89   . Depression   . Diverticulosis 05/10/2012  . DJD (degenerative joint disease)   . Dry skin dermatitis 11/18/2012  . Edema of leg 07/01/2010  . Epistaxis  01/24/2016   01/21/16: x2, held Xarelto x 1 day, Saline Nasal spray prn, wbc 9.5, Hgb 12.4, Plt 331, Na 138, K 4.5, Bun 25, creat 0.89 01/22/16 [id number])  . Esophageal dysmotility 07/26/2013  . Esophageal dysphagia 05/27/2010   New dx of achalasia 6/ 2015 09/18/13 Nifedipine 10mg  tid ac meals.    . Esophageal stricture   . Essential hypertension, benign 10/30/2013   12/02/15 wbc 6.1, Hgb 12.0, plt 271   . Essential thrombocythemia ([place]) 06/09/2009  . Fall 06/25/2013  . GERD (gastroesophageal reflux disease)   . Hip fracture ([place]) 09/08/2012  . HYPERCHOLESTEROLEMIA 03/12/2007   08/10/15 cholesterol 119, triglycerides 143, HDL 51, LDL 39   . Lumbar back pain   . MACULAR DEGENERATION 01/26/2008   Qualifier: Diagnosis of  By: Lenna Gilford MD, Deborra Medina   . Osteoarthritis 03/12/2007  . Osteoporosis 03/12/2007   Qualifier: Diagnosis of  By: Julien Girt CMA, Marliss Czar  06/15/14 dc Vit D 1000u po daily per pharm.    . Phlebitis and thrombophlebitis of other deep vessels of lower extremities 07/10/2010  . S/P balloon dilatation of esophageal stricture 07/28/2013   07/26/13. Dr. Henrene Pastor. EGD foreign removal and Balloon dilation of esophagus. Esophageal dysmotility, suspected achalasia, dilation at the GE junction to 9mm. Candida esophagitis.    . SYNCOPE 07/30/2009   Occurred 2011. Hospitalized. Felt due to dehydration.    . TRANSIENT ISCHEMIC ATTACKS, HX OF 03/12/2007   Qualifier: Diagnosis of  By: Julien Girt CMA, Marliss Czar     Past Surgical History:  Procedure Laterality Date  . ABDOMINAL HYSTERECTOMY     and bso  . APPENDECTOMY    . BOTOX INJECTION N/A 09/18/2013   Procedure: BOTOX INJECTION;  Surgeon: Lafayette Dragon, MD;  Location: [facility];  Service: Endoscopy;  Laterality: N/A;  . BOTOX INJECTION N/A 10/16/2014   Procedure: BOTOX INJECTION;  Surgeon: Inda Castle, MD;  Location: [facility];  Service: Endoscopy;  Laterality: N/A;  . BOTOX INJECTION N/A 05/18/2016   Procedure: BOTOX INJECTION;  Surgeon: Milus Banister, MD;   Location: [facility];  Service: Endoscopy;  Laterality: N/A;  . BOTOX INJECTION N/A 02/14/2018   Procedure: BOTOX INJECTION;  Surgeon: Milus Banister, MD;  Location: [facility];  Service: Endoscopy;  Laterality: N/A;  . CATARACT EXTRACTION W/ INTRAOCULAR LENS  IMPLANT, BILATERAL    . CHOLECYSTECTOMY    . ESOPHAGEAL MANOMETRY N/A 08/25/2013   Procedure: ESOPHAGEAL MANOMETRY (EM);  Surgeon: Jerene Bears, MD;  Location: [facility];  Service: Gastroenterology;  Laterality: N/A;  . ESOPHAGOGASTRODUODENOSCOPY N/A 07/26/2013   Procedure: ESOPHAGOGASTRODUODENOSCOPY (EGD);  Surgeon: Irene Shipper, MD;  Location[facility] ENDOSCOPY;  Service: Endoscopy;  Laterality: N/A;  . ESOPHAGOGASTRODUODENOSCOPY N/A 10/16/2014   Procedure: ESOPHAGOGASTRODUODENOSCOPY (EGD);  Surgeon: Inda Castle, MD;  Location: [facility];  Service: Endoscopy;  Laterality: N/A;  with botox  . ESOPHAGOGASTRODUODENOSCOPY (EGD) WITH ESOPHAGEAL DILATION N/A 11/25/2012   Procedure: ESOPHAGOGASTRODUODENOSCOPY (EGD) WITH ESOPHAGEAL DILATION;  Surgeon: Jerene Bears, MD;  Location: [facility];  Service: Gastroenterology;  Laterality: N/A;  . ESOPHAGOGASTRODUODENOSCOPY (EGD) WITH PROPOFOL  N/A 09/18/2013   Procedure: ESOPHAGOGASTRODUODENOSCOPY (EGD) WITH PROPOFOL;  Surgeon: Lafayette Dragon, MD;  Location: [facility];  Service: Endoscopy;  Laterality: N/A;  . ESOPHAGOGASTRODUODENOSCOPY (EGD) WITH PROPOFOL N/A 05/18/2016   Procedure: ESOPHAGOGASTRODUODENOSCOPY (EGD) WITH PROPOFOL;  Surgeon: Milus Banister, MD;  Location: [facility];  Service: Endoscopy;  Laterality: N/A;  . ESOPHAGOGASTRODUODENOSCOPY (EGD) WITH PROPOFOL N/A 02/14/2018   Procedure: ESOPHAGOGASTRODUODENOSCOPY (EGD) WITH PROPOFOL;  Surgeon: Milus Banister, MD;  Location: [facility];  Service: Endoscopy;  Laterality: N/A;  . HIP ARTHROPLASTY Right 09/09/2012   Procedure: ARTHROPLASTY BIPOLAR HIP;  Surgeon: Mauri Pole, MD;  Location: [facility];  Service: Orthopedics;   Laterality: Right;  . TAH and BSO  08/21/1995    Allergies  Allergen Reactions  . Sulfamethoxazole Rash    Allergies as of 03/12/2019      Reactions   Sulfamethoxazole Rash      Medication List       Accurate as of March 12, 2019  2:24 PM. If you have any questions, ask your nurse or doctor.        STOP taking these medications   HYDROcodone-acetaminophen 10-325 MG tablet Commonly known as: NORCO Stopped by: Collis Thede X Shoshannah Fa[place], NP   zinc sulfate 220 (50 Zn) MG capsule Stopped by: Arya Boxley X Omere Marti, NP     TAKE these medications   acetaminophen 325 MG tablet Commonly known as: TYLENOL Take 650 mg by mouth every 8 (eight) hours as needed for moderate pain.   albuterol 108 (90 Base) MCG/ACT inhaler Commonly known as: VENTOLIN HFA Inhale 2 puffs into the lungs every 6 (six) hours as needed for wheezing or shortness of breath.   ascorbic acid 500 MG tablet Commonly known as: VITAMIN C Take 500 mg by mouth daily.   Breo Ellipta 100-25 MCG/INH Aepb Generic drug: fluticasone furoate-vilanterol Inhale 1 puff into the lungs daily. One puff inhale once a day, rinse mouth after each use.   chlorhexidine 0.12 % solution Commonly known as: PERIDEX Use as directed 5 mLs in the mouth or throat daily. SWISH AND SPIT 5ML FOR 30 SECONDS EVERYDAY INDEFINITELY   cholecalciferol 1000 units tablet Commonly known as: VITAMIN D Take 1,000 Units by mouth daily.   Denta 5000 Plus 1.1 % Crea dental cream Generic drug: sodium fluoride Place 1 application onto teeth at bedtime. Apply a thin ribbon   Humidifier Misc Please use humidifier to prevent excessive dryness.   hydroxyurea 500 MG capsule Commonly known as: HYDREA Take 500 mg by mouth. Take 2 capsules=1000 mg on Monday. (Wear gloves when handling medication) May take with food to minimize GI side effects.   hydroxyurea 500 MG capsule Commonly known as: HYDREA Take 500 mg by mouth. TAKE ONE CAP BY MOUTH EVERY SUNDAY, [facility] AND  Friday.May take with food to minimize GI side effects.   metoprolol tartrate 25 MG tablet Commonly known as: LOPRESSOR Take 25 mg by mouth 2 (two) times daily.   montelukast 10 MG tablet Commonly known as: SINGULAIR Take 10 mg by mouth at bedtime.   NIFEdipine 10 MG capsule Commonly known as: PROCARDIA Take 10 mg by mouth 3 (three) times daily before meals. Squeeze contents of one capsule under tongue 15-30 minutes before meals 3 times a day   nitroGLYCERIN 0.4 MG SL tablet Commonly known as: NITROSTAT Place 0.4 mg under the tongue every 5 (five) minutes as needed for chest pain.   omeprazole 20 MG capsule Commonly known as: PRILOSEC Take 20 mg by mouth  at bedtime.   potassium chloride SA 20 MEQ tablet Commonly known as: KLOR-CON Take 20 mEq by mouth daily.   PreserVision AREDS 2 Caps Take 1 capsule by mouth 2 (two) times daily.   Rivaroxaban 15 MG Tabs tablet Commonly known as: XARELTO Take 15 mg by mouth daily.   sertraline 25 MG tablet Commonly known as: ZOLOFT Take 25 mg by mouth daily.      ROS was provided with assistance of staff.  Review of Systems  Constitutional: Positive for activity change, appetite change, fatigue and unexpected weight change. Negative for chills, diaphoresis and fever.       Weight loss 7-8Ibs since onset of COVID PNA  HENT: Positive for hearing loss and trouble swallowing. Negative for congestion and voice change.   Respiratory: Negative for cough, shortness of breath and wheezing.   Cardiovascular: Negative for chest pain, palpitations and leg swelling.  Gastrointestinal: Negative for abdominal distention, abdominal pain, constipation, diarrhea, nausea and vomiting.  Genitourinary: Negative for difficulty urinating, dysuria and urgency.  Musculoskeletal: Positive for arthralgias, back pain and gait problem.       Moaning.   Neurological: Negative for dizziness, speech difficulty, weakness and headaches.       Dementia    Psychiatric/Behavioral: Positive for confusion. Negative for agitation, behavioral problems, hallucinations and sleep disturbance. The patient is not nervous/anxious.        More confused.     Immunization History  Administered Date(s) Administered  . Influenza Split 11/16/2010, 12/13/2011  . Influenza Whole 11/27/2009, 11/29/2017  . Influenza, High Dose Seasonal PF 11/29/2018  . Influenza-Unspecified 12/31/2013, 11/18/2014, 12/14/2015, 12/11/2016  . PPD Test 09/11/2012  . Pneumococcal Conjugate-13 06/01/2017  . Pneumococcal Polysaccharide-23 02/28/1988  . Tdap 05/31/2017   Pertinent  Health Maintenance Due  Topic Date Due  . INFLUENZA VACCINE  Completed  . DEXA SCAN  Completed  . PNA vac Low Risk Adult  Completed   Fall Risk  02/08/2017 02/08/2017 10/04/2015 12/28/2014 10/23/2014  Falls in the past year? No Exclusion - non ambulatory No Yes Yes  Number falls in past yr: - - - 1 1  Injury with Fall? - - - No No  Risk for fall due to : - Impaired balance/gait;Impaired vision Impaired mobility;Impaired balance/gait History of fall(s);Impaired balance/gait;Impaired mobility Impaired mobility  Follow up - - - Falls prevention discussed Falls evaluation completed   Functional Status Survey:    Vitals:   03/12/19 1056  BP: 130/68  Pulse: 72  Resp: 18  Temp: (!) 97.3 F (36.3 C)  SpO2: 93%  Weight: 96 lb 3.2 oz (43.6 kg)  Height: 5\' 1"  (1.549 m)   Body mass index is 18.18 kg/m. Physical Exam Vitals and nursing note reviewed.  Constitutional:      General: She is not in acute distress.    Appearance: She is ill-appearing. She is not toxic-appearing or diaphoretic.     Comments: Frail, weak  HENT:     Head: Normocephalic and atraumatic.     Nose: Nose normal.     Mouth/Throat:     Mouth: Mucous membranes are dry.  Eyes:     Extraocular Movements: Extraocular movements intact.     Conjunctiva/sclera: Conjunctivae normal.     Pupils: Pupils are equal, round, and reactive  to light.  Cardiovascular:     Rate and Rhythm: Normal rate and regular rhythm.     Heart sounds: No murmur.  Pulmonary:     Breath sounds: No wheezing, rhonchi or rales.  Abdominal:     General: Bowel sounds are normal. There is no distension.     Palpations: Abdomen is soft.     Tenderness: There is no abdominal tenderness. There is no right CVA tenderness, left CVA tenderness, guarding or rebound.  Musculoskeletal:     Right lower leg: No edema.     Left lower leg: No edema.  Skin:    General: Skin is warm and dry.     Comments: Dry warm skin, slight redness in checks.   Neurological:     General: No focal deficit present.     Mental Status: She is alert.     Motor: No weakness.     Coordination: Coordination normal.     Gait: Gait abnormal.     Comments: More confused, but still oriented to self.   Psychiatric:     Comments: Confused, calling mom mom     Labs reviewed: Recent Labs    02/25/19 0000 02/27/19 0000 03/11/19 0000  NA 143 145 146  K 3.2* 3.5 3.4  CL 108 107 108  CO2 25* 28* 28*  BUN 10 15 20   CREATININE 0.5 0.6 0.6  CALCIUM 8.3* 8.5* 9.1   Recent Labs    04/30/18 0000 10/29/18 0000 01/28/19 0000 02/18/19 0000 02/20/19 0000 03/11/19 0000  AST 16 14 15 18   --  45*  ALT 9 8 9 13   --  30  ALKPHOS 70 55 62 71  --  121  PROT 5.8 5.4*  --   --   --   --   ALBUMIN 3.4 3.2  --   --  3.3* 2.9*   Recent Labs    01/28/19 0000 02/18/19 0000 02/20/19 0000 03/11/19 0000  WBC 5.6 4.4 6.9 12.7  NEUTROABS 3,231 2,328 4,954 10,122  HGB 11.5* 12.3 13.2 11.5*  HCT 34* 37 39 35*  PLT 289  --  296 517*   Lab Results  Component Value Date   TSH 2.00 09/21/2016   Lab Results  Component Value Date   HGBA1C 5.5 10/16/2014   Lab Results  Component Value Date   CHOL 143 09/18/2017   HDL 50 09/18/2017   LDLCALC 70 09/18/2017   LDLDIRECT 152.3 06/04/2009   TRIG 147 09/18/2017   CHOLHDL 3 09/13/2011    Significant Diagnostic Results in last 30  days:  No results found.  Assessment/Plan Community acquired pneumonia of left lower lobe of lung 02/14/19 COVID PNA CXR lateral right lung base focal density. Zpk 03/11/19 CXR pulmonary infiltrate in the left lung base, small pleural effusion, Levaquin 250mg  qd x7   Adult failure to thrive  simplify meds: dc Vit C, Vit D, Singulair, may consider Hospice if no better.  Dehydration persisted poor appetite, dry mouth/skin, and generalized weakness, 03/11/19 Na 146, K 3.4, Bun 20, creat 0.56, AST 45, ALT 30, wbc 12.7, Hgb 11.5, plt 517, neutrophils 79.2. D5 1/2 NS 2l, repeat BMP after BMP   BACK PAIN, LUMBAR Will reinstate Norco prn for 72hours, observe.   Essential thrombocythemia ([facility]) Plt is elevated 517, hemoconcentration contributory, continue Hydrea.    Anxiety and depression Her mood is stable, continue Sertraline.   History of DVT (deep vein thrombosis) Continue Xarelto  Essential hypertension, benign Blood pressure is controlled, continue Metoprolol.   COPD with asthma ([place]) No O2 desaturation, no wheezes. Continue Breo  GERD Stable, continue Omeprazole, Nifedipine.   Alzheimer's dementia without behavioral disturbance ([place]) Progressed since onset of COVID PNA, off Memantine, may consider  Hospice care is not better after ABT/IVF  Leukocytosis Mild, identified left lower lobe PNA, dehydration can be contributory, pending UA.      Family/ staff Communication: plan of care reviewed with the patient and charge nurse.   Labs/tests ordered:  BMP upon completion of IVF. Pending UA C/S  Time spend 35 minutes.

## 2019-03-12 NOTE — Assessment & Plan Note (Signed)
Stable, continue Omeprazole, Nifedipine.

## 2019-03-12 NOTE — Assessment & Plan Note (Addendum)
02/14/19 COVID PNA CXR lateral right lung base focal density. Zpk 03/11/19 CXR pulmonary infiltrate in the left lung base, small pleural effusion, Levaquin 250mg  qd x7

## 2019-03-12 NOTE — Assessment & Plan Note (Addendum)
simplify meds: dc Vit C, Vit D, Singulair, may consider Hospice if no better.

## 2019-03-12 NOTE — Assessment & Plan Note (Signed)
persisted poor appetite, dry mouth/skin, and generalized weakness, 03/11/19 Na 146, K 3.4, Bun 20, creat 0.56, AST 45, ALT 30, wbc 12.7, Hgb 11.5, plt 517, neutrophils 79.2. D5 1/2 NS 2l, repeat BMP after BMP

## 2019-03-12 NOTE — Assessment & Plan Note (Signed)
Blood pressure is controlled, continue Metoprolol. 

## 2019-03-13 ENCOUNTER — Other Ambulatory Visit: Payer: Self-pay | Admitting: *Deleted

## 2019-03-13 MED ORDER — HYDROCODONE-ACETAMINOPHEN 10-325 MG PO TABS: ORAL_TABLET | ORAL | 0 refills | Status: DC

## 2019-03-13 NOTE — Telephone Encounter (Signed)
Received fax from [facility] °Pended Rx and sent to [facility] for approval.  °

## 2019-03-17 ENCOUNTER — Non-Acute Institutional Stay (SKILLED_NURSING_FACILITY): Payer: Medicare Other | Admitting: Nurse Practitioner

## 2019-03-17 ENCOUNTER — Encounter: Payer: Self-pay | Admitting: Nurse Practitioner

## 2019-03-17 DIAGNOSIS — M25531 Pain in right wrist: Secondary | ICD-10-CM

## 2019-03-17 DIAGNOSIS — J189 Pneumonia, unspecified organism: Secondary | ICD-10-CM | POA: Diagnosis not present

## 2019-03-17 DIAGNOSIS — R627 Adult failure to thrive: Secondary | ICD-10-CM

## 2019-03-17 DIAGNOSIS — F0280 Dementia in other diseases classified elsewhere without behavioral disturbance: Secondary | ICD-10-CM

## 2019-03-17 DIAGNOSIS — G309 Alzheimer's disease, unspecified: Secondary | ICD-10-CM | POA: Diagnosis not present

## 2019-03-17 NOTE — Progress Notes (Signed)
Location:  [place] Room Number: 35-A Place of Service:  SNF (31) Provider:  Ngai Parcell X, NP  Patient Care Team: Virgie Dad, MD as PCP - General (Internal Medicine) Annia Belt, MD as Consulting Physician (Hematology and Oncology) Suella Broad, MD as Consulting Physician (Physical Medicine and Rehabilitation) Noralee Space, MD (Pulmonary Disease) [place] Tmya Wigington X, NP as Nurse Practitioner (Nurse Practitioner) Inda Castle, MD (Inactive) as Consulting Physician (Gastroenterology)  Extended Emergency Contact Information Primary Emergency Contact: Sontag,Gene Address: [address]          [place], [place] [address] [place] Phone: [phone] Work Phone: [phone] Mobile Phone: [phone] Relation: Son Secondary Emergency Contact: [name], [place] [address] [place] of [place] Phone: [phone] Relation: Son  Code Status:  DNR Goals of care: Advanced Directive information Advanced Directives 03/17/2019  Does Patient Have a Medical Advance Directive? Yes  Type of Advance Directive Living will;Healthcare Power of Attorney  Does patient want to make changes to medical advance directive? No - Patient declined  Copy of [place] in Chart? Yes - validated most recent copy scanned in chart (See row information)  Pre-existing out of facility DNR order (yellow form or pink MOST form) -     Chief Complaint  Patient presents with  . Acute Visit    Patient is seen for right arm pain and persistent poor appetite    HPI:  Pt is a 86 y.o. female seen today for an acute visit for persisted poor appetite, only a few sips fluid intake through out day, unable to take po meds,  regardless ABT/IVF. New onset right wrist redness, pain, swelling, and warmth, able to ROM with support, no bruise or trauma. HPOA agreed to comfort measures, Hospice  service.    Past Medical History:  Diagnosis Date  . Abnormal weight gain 05/09/2012  . Achalasia of esophagus 09/18/2013   09/18/13 Nifedipine 10mg  tid ac meals. 11/17/14 [facility] f/u as neeed   . Anxiety and depression 03/12/2012   Increased Sertraline to 50mg  daily 07/16/13 08/26/13 Mirtazapine 7.5mg  qhs  10/12/14 Pharm decreased Sertraline to 25mg .  05/04/15 TSH 2.89 09/15/15 pharm taper off Sertraline.  10/08/15 continue Sertraline 25mg  daily, POA desires. 12/02/15 wbc 6.1, Hgb 12.0, plt 271    . Asthmatic bronchitis   . BACK PAIN, LUMBAR 03/12/2007   Hx of lower back pain, had X-ray of her lumbar spine 09/08/12--spondylosis-better controlled with Hydrocodone/ApAp  10/325mg  ac and hs  01/01/13 lumbar spine inj. Able to ambulate with walker on unit until her fall 06/22/13. 06/25/13 unremarkable. X-ray lumbar spine. 11/10/13 dc Oxycodone prn-not used in 60 days.     . COLONIC POLYPS 03/12/2007   Initially noted April 2002 colonoscopy. Followup colonoscopy 05/31/2006 did not disclose any further polyps.    Marland Kitchen COPD (chronic obstructive pulmonary disease) ([place])   . Coronary artery disease   . Deep vein thrombosis of left lower extremity ([place]) 07/19/2012  . Dementia ([place]) 09/16/2012   05/04/15 TSH 2.89   . Depression   . Diverticulosis 05/10/2012  . DJD (degenerative joint disease)   . Dry skin dermatitis 11/18/2012  . Edema of leg 07/01/2010  . Epistaxis 01/24/2016   01/21/16: x2, held Xarelto x 1 day, Saline Nasal spray prn, wbc 9.5, Hgb 12.4, Plt 331, Na 138, K 4.5, Bun 25, creat 0.89 01/22/16 [id number])  .  Esophageal dysmotility 07/26/2013  . Esophageal dysphagia 05/27/2010   New dx of achalasia 6/ 2015 09/18/13 Nifedipine 10mg  tid ac meals.    . Esophageal stricture   . Essential hypertension, benign 10/30/2013   12/02/15 wbc 6.1, Hgb 12.0, plt 271   . Essential thrombocythemia ([place]) 06/09/2009  . Fall 06/25/2013  . GERD (gastroesophageal reflux disease)   . Hip fracture ([place]) 09/08/2012  . HYPERCHOLESTEROLEMIA  03/12/2007   08/10/15 cholesterol 119, triglycerides 143, HDL 51, LDL 39   . Lumbar back pain   . MACULAR DEGENERATION 01/26/2008   Qualifier: Diagnosis of  By: Lenna Gilford MD, Deborra Medina   . Osteoarthritis 03/12/2007  . Osteoporosis 03/12/2007   Qualifier: Diagnosis of  By: Julien Girt CMA, Marliss Czar  06/15/14 dc Vit D 1000u po daily per pharm.    . Phlebitis and thrombophlebitis of other deep vessels of lower extremities 07/10/2010  . S/P balloon dilatation of esophageal stricture 07/28/2013   07/26/13. Dr. Henrene Pastor. EGD foreign removal and Balloon dilation of esophagus. Esophageal dysmotility, suspected achalasia, dilation at the GE junction to 37mm. Candida esophagitis.    . SYNCOPE 07/30/2009   Occurred 2011. Hospitalized. Felt due to dehydration.    . TRANSIENT ISCHEMIC ATTACKS, HX OF 03/12/2007   Qualifier: Diagnosis of  By: Julien Girt CMA, Marliss Czar     Past Surgical History:  Procedure Laterality Date  . ABDOMINAL HYSTERECTOMY     and bso  . APPENDECTOMY    . BOTOX INJECTION N/A 09/18/2013   Procedure: BOTOX INJECTION;  Surgeon: Lafayette Dragon, MD;  Location: [facility];  Service: Endoscopy;  Laterality: N/A;  . BOTOX INJECTION N/A 10/16/2014   Procedure: BOTOX INJECTION;  Surgeon: Inda Castle, MD;  Location: [facility];  Service: Endoscopy;  Laterality: N/A;  . BOTOX INJECTION N/A 05/18/2016   Procedure: BOTOX INJECTION;  Surgeon: Milus Banister, MD;  Location: [facility];  Service: Endoscopy;  Laterality: N/A;  . BOTOX INJECTION N/A 02/14/2018   Procedure: BOTOX INJECTION;  Surgeon: Milus Banister, MD;  Location: [facility];  Service: Endoscopy;  Laterality: N/A;  . CATARACT EXTRACTION W/ INTRAOCULAR LENS  IMPLANT, BILATERAL    . CHOLECYSTECTOMY    . ESOPHAGEAL MANOMETRY N/A 08/25/2013   Procedure: ESOPHAGEAL MANOMETRY (EM);  Surgeon: Jerene Bears, MD;  [facility]: [facility];  Service: Gastroenterology;  Laterality: N/A;  . ESOPHAGOGASTRODUODENOSCOPY N/A 07/26/2013   Procedure:  ESOPHAGOGASTRODUODENOSCOPY (EGD);  Surgeon: Irene Shipper, MD;  Location: [facility] ENDOSCOPY;  Service: Endoscopy;  Laterality: N/A;  . ESOPHAGOGASTRODUODENOSCOPY N/A 10/16/2014   Procedure: ESOPHAGOGASTRODUODENOSCOPY (EGD);  Surgeon: Inda Castle, MD;  Location: [facility];  Service: Endoscopy;  Laterality: N/A;  with botox  . ESOPHAGOGASTRODUODENOSCOPY (EGD) WITH ESOPHAGEAL DILATION N/A 11/25/2012   Procedure: ESOPHAGOGASTRODUODENOSCOPY (EGD) WITH ESOPHAGEAL DILATION;  Surgeon: Jerene Bears, MD;  [facility];  Service: Gastroenterology;  Laterality: N/A;  . ESOPHAGOGASTRODUODENOSCOPY (EGD) WITH PROPOFOL N/A 09/18/2013   Procedure: ESOPHAGOGASTRODUODENOSCOPY (EGD) WITH PROPOFOL;  Surgeon: Lafayette Dragon, MD;  [facility];  Service: Endoscopy;  Laterality: N/A;  . ESOPHAGOGASTRODUODENOSCOPY (EGD) WITH PROPOFOL N/A 05/18/2016   Procedure: ESOPHAGOGASTRODUODENOSCOPY (EGD) WITH PROPOFOL;  Surgeon: Milus Banister, MD;  [facility];  Service: Endoscopy;  Laterality: N/A;  . ESOPHAGOGASTRODUODENOSCOPY (EGD) WITH PROPOFOL N/A 02/14/2018   Procedure: ESOPHAGOGASTRODUODENOSCOPY (EGD) WITH PROPOFOL;  Surgeon: Milus Banister, MD;  [facility];  Service: Endoscopy;  Laterality: N/A;  . HIP ARTHROPLASTY Right 09/09/2012   Procedure: ARTHROPLASTY BIPOLAR HIP;  Surgeon: Mauri Pole, MD;  Location: [facility];  Service: Orthopedics;  Laterality: Right;  . TAH and BSO  08/21/1995    Allergies  Allergen Reactions  . Sulfamethoxazole Rash    Outpatient Encounter Medications as of 03/17/2019  Medication Sig  . bisacodyl (DULCOLAX) 10 MG suppository Place 10 mg rectally as needed for moderate constipation (If no BM in 3 days).  . diclofenac Sodium (VOLTAREN) 1 % GEL Apply topically 4 (four) times daily. Apply to right wrist  . Humidifier MISC Please use humidifier to prevent excessive dryness.  . lactose free nutrition (BOOST) LIQD Take 237 mLs by mouth 3 (three) times daily.   Marland Kitchen morphine (ROXANOL) 20 MG/ML concentrated solution Take 5 mg by mouth 2 (two) times daily.  Marland Kitchen morphine (ROXANOL) 20 MG/ML concentrated solution Take 5 mg by mouth every 6 (six) hours as needed for severe pain.  . sodium chloride (OCEAN) 0.65 % nasal spray Place 1 spray into the nose 2 (two) times daily. Apply to both nostrils for nasal congestion  . [DISCONTINUED] acetaminophen (TYLENOL) 325 MG tablet Take 650 mg by mouth every 8 (eight) hours as needed for moderate pain.   . [DISCONTINUED] albuterol (PROVENTIL HFA;VENTOLIN HFA) 108 (90 BASE) MCG/ACT inhaler Inhale 2 puffs into the lungs every 6 (six) hours as needed for wheezing or shortness of breath.  . [DISCONTINUED] ascorbic acid (VITAMIN C) 500 MG tablet Take 500 mg by mouth daily.  . [DISCONTINUED] chlorhexidine (PERIDEX) 0.12 % solution Use as directed 5 mLs in the mouth or throat daily. SWISH AND SPIT 5ML FOR 30 SECONDS EVERYDAY INDEFINITELY  . [DISCONTINUED] cholecalciferol (VITAMIN D) 1000 units tablet Take 1,000 Units by mouth daily.  . [DISCONTINUED] fluticasone furoate-vilanterol (BREO ELLIPTA) 100-25 MCG/INH AEPB Inhale 1 puff into the lungs daily. One puff inhale once a day, rinse mouth after each use.   . [DISCONTINUED] HYDROcodone-acetaminophen (NORCO) 10-325 MG tablet Take one tablet by mouth twice daily as needed for 3 days. Hold for sedation.  . [DISCONTINUED] hydroxyurea (HYDREA) 500 MG capsule Take 500 mg by mouth. Take 2 capsules=1000 mg on Monday. (Wear gloves when handling medication) May take with food to minimize GI side effects.  . [DISCONTINUED] hydroxyurea (HYDREA) 500 MG capsule Take 500 mg by mouth. TAKE ONE CAP BY MOUTH EVERY SUNDAY, [facility] AND Friday.May take with food to minimize GI side effects.  . [DISCONTINUED] metoprolol tartrate (LOPRESSOR) 25 MG tablet Take 25 mg by mouth 2 (two) times daily.  . [DISCONTINUED] montelukast (SINGULAIR) 10 MG tablet Take 10 mg by mouth at bedtime.  . [DISCONTINUED] Multiple  Vitamins-Minerals (PRESERVISION AREDS 2) CAPS Take 1 capsule by mouth 2 (two) times daily.  . [DISCONTINUED] NIFEdipine (PROCARDIA) 10 MG capsule Take 10 mg by mouth 3 (three) times daily before meals. Squeeze contents of one capsule under tongue 15-30 minutes before meals 3 times a day  . [DISCONTINUED] nitroGLYCERIN (NITROSTAT) 0.4 MG SL tablet Place 0.4 mg under the tongue every 5 (five) minutes as needed for chest pain.  . [DISCONTINUED] omeprazole (PRILOSEC) 20 MG capsule Take 20 mg by mouth at bedtime.   . [DISCONTINUED] potassium chloride SA (KLOR-CON) 20 MEQ tablet Take 20 mEq by mouth daily.  . [DISCONTINUED] Rivaroxaban (XARELTO) 15 MG TABS tablet Take 15 mg by mouth daily.  . [DISCONTINUED] sertraline (ZOLOFT) 25 MG tablet Take 25 mg by mouth daily.  . [DISCONTINUED] sodium fluoride (DENTA 5000 PLUS) 1.1 % CREA dental cream Place 1 application onto teeth at bedtime. Apply a thin ribbon   No facility-administered encounter  medications on file as of 03/17/2019.   ROS was provided with assistance of staff and the patient's daughter at bed side during today's examination.  Review of Systems  Constitutional: Positive for activity change, appetite change, fatigue and unexpected weight change. Negative for chills, diaphoresis and fever.  HENT: Positive for hearing loss. Negative for voice change.   Respiratory: Negative for cough, shortness of breath and wheezing.   Cardiovascular: Negative for chest pain, palpitations and leg swelling.  Gastrointestinal: Negative for abdominal distention, abdominal pain, constipation, diarrhea, nausea and vomiting.  Genitourinary: Negative for difficulty urinating, dysuria and urgency.       Incontinent.   Musculoskeletal: Positive for arthralgias, back pain, gait problem and joint swelling.  Skin: Positive for color change.       Right wrist warmth, swelling, redness.   Neurological: Negative for dizziness, speech difficulty and weakness.       Dementia    Psychiatric/Behavioral: Negative for agitation, behavioral problems, hallucinations and sleep disturbance. The patient is not nervous/anxious.     Immunization History  Administered Date(s) Administered  . Influenza Split 11/16/2010, 12/13/2011  . Influenza Whole 11/27/2009, 11/29/2017  . Influenza, High Dose Seasonal PF 11/29/2018  . Influenza-Unspecified 12/31/2013, 11/18/2014, 12/14/2015, 12/11/2016  . PPD Test 09/11/2012  . Pneumococcal Conjugate-13 06/01/2017  . Pneumococcal Polysaccharide-23 02/28/1988  . Tdap 05/31/2017   Pertinent  Health Maintenance Due  Topic Date Due  . INFLUENZA VACCINE  Completed  . DEXA SCAN  Completed  . PNA vac Low Risk Adult  Completed   Fall Risk  02/08/2017 02/08/2017 10/04/2015 12/28/2014 10/23/2014  Falls in the past year? No Exclusion - non ambulatory No Yes Yes  Number falls in past yr: - - - 1 1  Injury with Fall? - - - No No  Risk for fall due to : - Impaired balance/gait;Impaired vision Impaired mobility;Impaired balance/gait History of fall(s);Impaired balance/gait;Impaired mobility Impaired mobility  Follow up - - - Falls prevention discussed Falls evaluation completed   Functional Status Survey:    Vitals:   03/17/19 1523  BP: 130/68  Pulse: 72  Resp: 18  Temp: 97.6 F (36.4 C)  TempSrc: Oral  SpO2: 92%  Weight: 96 lb 3.2 oz (43.6 kg)  Height: 5\' 1"  (1.549 m)   Body mass index is 18.18 kg/m. Physical Exam Vitals and nursing note reviewed.  Constitutional:      Appearance: She is ill-appearing.     Comments: Frail, weak.   HENT:     Head: Normocephalic and atraumatic.     Nose: Nose normal.     Mouth/Throat:     Mouth: Mucous membranes are dry.  Eyes:     Extraocular Movements: Extraocular movements intact.     Conjunctiva/sclera: Conjunctivae normal.     Pupils: Pupils are equal, round, and reactive to light.  Cardiovascular:     Rate and Rhythm: Normal rate and regular rhythm.     Heart sounds: No murmur.   Pulmonary:     Breath sounds: No wheezing, rhonchi or rales.  Abdominal:     General: Bowel sounds are normal. There is no distension.     Palpations: Abdomen is soft.     Tenderness: There is no abdominal tenderness. There is no right CVA tenderness, left CVA tenderness, guarding or rebound.  Musculoskeletal:     Cervical back: Normal range of motion and neck supple.     Right lower leg: No edema.     Left lower leg: No edema.  Skin:  General: Skin is warm and dry.     Findings: Erythema present.     Comments: Right wrist redness, warmth, tenderness, swelling.   Neurological:     General: No focal deficit present.     Mental Status: She is alert.     Motor: No weakness.     Coordination: Coordination normal.     Gait: Gait abnormal.     Comments: Oriented to self.   Psychiatric:     Comments: Weak, but open eyes, nodded head when her daughter told her to have gel for her right wrist and Morphine to keep her comfortable.      Labs reviewed: Recent Labs    02/25/19 0000 02/27/19 0000 03/11/19 0000  NA 143 145 146  K 3.2* 3.5 3.4  CL 108 107 108  CO2 25* 28* 28*  BUN 10 15 20   CREATININE 0.5 0.6 0.6  CALCIUM 8.3* 8.5* 9.1   Recent Labs    04/30/18 0000 07/30/18 0000 10/29/18 0000 10/29/18 0000 01/28/19 0000 02/18/19 0000 02/20/19 0000 03/11/19 0000  AST 16   < > 14   < > 15 18  --  45*  ALT 9   < > 8   < > 9 13  --  30  ALKPHOS 70  --  55   < > 62 71  --  121  PROT 5.8  --  5.4*  --   --   --   --   --   ALBUMIN 3.4  --  3.2  --   --   --  3.3* 2.9*   < > = values in this interval not displayed.   Recent Labs    01/28/19 0000 01/28/19 0000 02/18/19 0000 02/20/19 0000 03/11/19 0000  WBC 5.6   < > 4.4 6.9 12.7  NEUTROABS 3,231   < > 2,328 4,954 10,122  HGB 11.5*   < > 12.3 13.2 11.5*  HCT 34*   < > 37 39 35*  PLT 289  --   --  296 517*   < > = values in this interval not displayed.   Lab Results  Component Value Date   TSH 2.00 09/21/2016    Lab Results  Component Value Date   HGBA1C 5.5 10/16/2014   Lab Results  Component Value Date   CHOL 143 09/18/2017   HDL 50 09/18/2017   LDLCALC 70 09/18/2017   LDLDIRECT 152.3 06/04/2009   TRIG 147 09/18/2017   CHOLHDL 3 09/13/2011    Significant Diagnostic Results in last 30 days:  No results found.  Assessment/Plan Adult failure to thrive Lost #8Ibs in the past 2 weeks, she has been treated for COVID PNA dehydration with ABT/IVF, she is afebrile, no wheezing, or O2 desaturation presently. HPOA agreed to comfort measures, Hospice service, dc all po meds, except Morphine and topical Voltaren gel to the right wrist.   Right wrist pain Acute gout vs inflammatory arthritis? Symptomatic management: Voltaren gel qid to the right wrist, Morphine 5mg  bid and q6hr prn.   Alzheimer's dementia without behavioral disturbance ([place]) Progressing, continue SNF [facility] for supportive care.   Community acquired pneumonia of left lower lobe of lung 02/14/19 COVID PNA CXR lateral right lung base focal density. Zpk 03/11/19 CXR pulmonary infiltrate in the left lung base, small pleural effusion, Levaquin 250mg  qd x7 03/17/19 no wheezing or O2 desaturation, but persisted limited oral intakes and generalized weakness.      Family/ staff Communication: plan of care  reviewed with the patient and charge nurse.   Labs/tests ordered: none  Time spend 25 minutes.

## 2019-03-18 ENCOUNTER — Encounter: Payer: Self-pay | Admitting: Nurse Practitioner

## 2019-03-18 DIAGNOSIS — M25531 Pain in right wrist: Secondary | ICD-10-CM | POA: Insufficient documentation

## 2019-03-18 NOTE — Assessment & Plan Note (Signed)
Acute gout vs inflammatory arthritis? Symptomatic management: Voltaren gel qid to the right wrist, Morphine 5mg  bid and q6hr prn.

## 2019-03-18 NOTE — Assessment & Plan Note (Signed)
02/14/19 COVID PNA CXR lateral right lung base focal density. Zpk 03/11/19 CXR pulmonary infiltrate in the left lung base, small pleural effusion, Levaquin 250mg  qd x7 03/17/19 no wheezing or O2 desaturation, but persisted limited oral intakes and generalized weakness.

## 2019-03-18 NOTE — Assessment & Plan Note (Signed)
Progressing, continue SNF [facility] for supportive care.

## 2019-03-18 NOTE — Assessment & Plan Note (Signed)
Lost #8Ibs in the past 2 weeks, she has been treated for COVID PNA dehydration with ABT/IVF, she is afebrile, no wheezing, or O2 desaturation presently. HPOA agreed to comfort measures, Hospice service, dc all po meds, except Morphine and topical Voltaren gel to the right wrist.

## 2019-03-23 ENCOUNTER — Telehealth: Payer: Self-pay | Admitting: Nurse Practitioner

## 2019-03-23 NOTE — Telephone Encounter (Signed)
Called while on call Apr 11, 2019 pt of hospice and pt has passed away on 04-11-19

## 2019-03-31 DEATH — deceased
